# Patient Record
Sex: Female | Born: 1975 | Race: White | Hispanic: No | Smoking: Never smoker
Health system: Southern US, Community
[De-identification: ages and names within clinical notes are randomized; demographics above are authoritative.]

## PROBLEM LIST (undated history)

## (undated) DIAGNOSIS — J45909 Unspecified asthma, uncomplicated: Secondary | ICD-10-CM

## (undated) DIAGNOSIS — J189 Pneumonia, unspecified organism: Secondary | ICD-10-CM

## (undated) DIAGNOSIS — T4145XA Adverse effect of unspecified anesthetic, initial encounter: Secondary | ICD-10-CM

## (undated) DIAGNOSIS — I429 Cardiomyopathy, unspecified: Secondary | ICD-10-CM

## (undated) DIAGNOSIS — R9431 Abnormal electrocardiogram [ECG] [EKG]: Secondary | ICD-10-CM

## (undated) DIAGNOSIS — F32A Depression, unspecified: Secondary | ICD-10-CM

## (undated) DIAGNOSIS — G629 Polyneuropathy, unspecified: Secondary | ICD-10-CM

## (undated) DIAGNOSIS — Z8489 Family history of other specified conditions: Secondary | ICD-10-CM

## (undated) DIAGNOSIS — F419 Anxiety disorder, unspecified: Secondary | ICD-10-CM

## (undated) DIAGNOSIS — F4310 Post-traumatic stress disorder, unspecified: Secondary | ICD-10-CM

## (undated) DIAGNOSIS — Z6791 Unspecified blood type, Rh negative: Secondary | ICD-10-CM

## (undated) DIAGNOSIS — K56609 Unspecified intestinal obstruction, unspecified as to partial versus complete obstruction: Secondary | ICD-10-CM

## (undated) DIAGNOSIS — R1900 Intra-abdominal and pelvic swelling, mass and lump, unspecified site: Secondary | ICD-10-CM

## (undated) DIAGNOSIS — M5136 Other intervertebral disc degeneration, lumbar region: Secondary | ICD-10-CM

## (undated) DIAGNOSIS — G8929 Other chronic pain: Secondary | ICD-10-CM

## (undated) DIAGNOSIS — T8859XA Other complications of anesthesia, initial encounter: Secondary | ICD-10-CM

## (undated) DIAGNOSIS — O26899 Other specified pregnancy related conditions, unspecified trimester: Secondary | ICD-10-CM

## (undated) DIAGNOSIS — R519 Headache, unspecified: Secondary | ICD-10-CM

## (undated) DIAGNOSIS — E538 Deficiency of other specified B group vitamins: Secondary | ICD-10-CM

## (undated) DIAGNOSIS — G43909 Migraine, unspecified, not intractable, without status migrainosus: Secondary | ICD-10-CM

## (undated) DIAGNOSIS — E119 Type 2 diabetes mellitus without complications: Secondary | ICD-10-CM

## (undated) DIAGNOSIS — I499 Cardiac arrhythmia, unspecified: Secondary | ICD-10-CM

## (undated) DIAGNOSIS — Z87442 Personal history of urinary calculi: Secondary | ICD-10-CM

## (undated) DIAGNOSIS — R569 Unspecified convulsions: Secondary | ICD-10-CM

## (undated) DIAGNOSIS — F329 Major depressive disorder, single episode, unspecified: Secondary | ICD-10-CM

## (undated) DIAGNOSIS — M1990 Unspecified osteoarthritis, unspecified site: Secondary | ICD-10-CM

## (undated) DIAGNOSIS — M51369 Other intervertebral disc degeneration, lumbar region without mention of lumbar back pain or lower extremity pain: Secondary | ICD-10-CM

## (undated) DIAGNOSIS — E669 Obesity, unspecified: Secondary | ICD-10-CM

## (undated) DIAGNOSIS — N83209 Unspecified ovarian cyst, unspecified side: Secondary | ICD-10-CM

## (undated) DIAGNOSIS — E559 Vitamin D deficiency, unspecified: Secondary | ICD-10-CM

## (undated) DIAGNOSIS — K219 Gastro-esophageal reflux disease without esophagitis: Secondary | ICD-10-CM

## (undated) DIAGNOSIS — D649 Anemia, unspecified: Secondary | ICD-10-CM

## (undated) DIAGNOSIS — E611 Iron deficiency: Secondary | ICD-10-CM

## (undated) DIAGNOSIS — R51 Headache: Secondary | ICD-10-CM

## (undated) DIAGNOSIS — R109 Unspecified abdominal pain: Secondary | ICD-10-CM

## (undated) HISTORY — DX: Depression, unspecified: F32.A

## (undated) HISTORY — DX: Migraine, unspecified, not intractable, without status migrainosus: G43.909

## (undated) HISTORY — DX: Vitamin D deficiency, unspecified: E55.9

## (undated) HISTORY — PX: ROUX-EN-Y PROCEDURE: SUR1287

## (undated) HISTORY — DX: Unspecified convulsions: R56.9

## (undated) HISTORY — DX: Major depressive disorder, single episode, unspecified: F32.9

## (undated) HISTORY — DX: Polyneuropathy, unspecified: G62.9

## (undated) HISTORY — PX: ESOPHAGOGASTRODUODENOSCOPY: SHX1529

## (undated) HISTORY — DX: Post-traumatic stress disorder, unspecified: F43.10

## (undated) HISTORY — DX: Anxiety disorder, unspecified: F41.9

## (undated) HISTORY — PX: ABDOMINAL HYSTERECTOMY: SHX81

## (undated) HISTORY — PX: CHOLECYSTECTOMY: SHX55

## (undated) HISTORY — DX: Unspecified asthma, uncomplicated: J45.909

## (undated) HISTORY — PX: GASTRIC BYPASS: SHX52

## (undated) HISTORY — DX: Headache: R51

## (undated) HISTORY — PX: HERNIA REPAIR: SHX51

## (undated) HISTORY — DX: Headache, unspecified: R51.9

## (undated) HISTORY — PX: DILATION AND CURETTAGE OF UTERUS: SHX78

---

## 1997-06-25 ENCOUNTER — Ambulatory Visit (HOSPITAL_COMMUNITY): Admission: RE | Admit: 1997-06-25 | Discharge: 1997-06-25 | Payer: Self-pay | Admitting: Obstetrics and Gynecology

## 1997-09-02 ENCOUNTER — Inpatient Hospital Stay (HOSPITAL_COMMUNITY): Admission: AD | Admit: 1997-09-02 | Discharge: 1997-09-02 | Payer: Self-pay | Admitting: Obstetrics and Gynecology

## 1997-09-04 ENCOUNTER — Inpatient Hospital Stay (HOSPITAL_COMMUNITY): Admission: AD | Admit: 1997-09-04 | Discharge: 1997-09-07 | Payer: Self-pay | Admitting: Obstetrics and Gynecology

## 1997-10-15 ENCOUNTER — Other Ambulatory Visit: Admission: RE | Admit: 1997-10-15 | Discharge: 1997-10-15 | Payer: Self-pay | Admitting: Obstetrics and Gynecology

## 2000-09-13 ENCOUNTER — Emergency Department (HOSPITAL_COMMUNITY): Admission: EM | Admit: 2000-09-13 | Discharge: 2000-09-13 | Payer: Self-pay

## 2002-05-04 ENCOUNTER — Other Ambulatory Visit: Admission: RE | Admit: 2002-05-04 | Discharge: 2002-05-04 | Payer: Self-pay | Admitting: Gynecology

## 2003-01-14 ENCOUNTER — Emergency Department (HOSPITAL_COMMUNITY): Admission: EM | Admit: 2003-01-14 | Discharge: 2003-01-14 | Payer: Self-pay | Admitting: Emergency Medicine

## 2004-05-09 ENCOUNTER — Ambulatory Visit (HOSPITAL_COMMUNITY): Admission: RE | Admit: 2004-05-09 | Discharge: 2004-05-09 | Payer: Self-pay | Admitting: Neurosurgery

## 2004-08-21 ENCOUNTER — Emergency Department (HOSPITAL_COMMUNITY): Admission: EM | Admit: 2004-08-21 | Discharge: 2004-08-21 | Payer: Self-pay | Admitting: Emergency Medicine

## 2006-04-01 ENCOUNTER — Encounter: Admission: RE | Admit: 2006-04-01 | Discharge: 2006-04-01 | Payer: Self-pay | Admitting: Neurosurgery

## 2006-08-24 ENCOUNTER — Ambulatory Visit (HOSPITAL_COMMUNITY): Admission: RE | Admit: 2006-08-24 | Discharge: 2006-08-24 | Payer: Self-pay | Admitting: Family Medicine

## 2007-02-16 HISTORY — PX: OTHER SURGICAL HISTORY: SHX169

## 2008-02-16 HISTORY — PX: GASTRIC BYPASS: SHX52

## 2008-03-08 ENCOUNTER — Encounter: Admission: RE | Admit: 2008-03-08 | Discharge: 2008-03-08 | Payer: Self-pay | Admitting: Family Medicine

## 2008-09-05 ENCOUNTER — Ambulatory Visit: Payer: Self-pay | Admitting: Internal Medicine

## 2008-09-05 ENCOUNTER — Inpatient Hospital Stay (HOSPITAL_COMMUNITY): Admission: EM | Admit: 2008-09-05 | Discharge: 2008-09-08 | Payer: Self-pay | Admitting: Emergency Medicine

## 2008-10-02 ENCOUNTER — Ambulatory Visit (HOSPITAL_COMMUNITY): Admission: RE | Admit: 2008-10-02 | Discharge: 2008-10-02 | Payer: Self-pay | Admitting: Cardiology

## 2009-01-20 ENCOUNTER — Emergency Department (HOSPITAL_COMMUNITY): Admission: EM | Admit: 2009-01-20 | Discharge: 2009-01-20 | Payer: Self-pay | Admitting: Emergency Medicine

## 2009-02-15 HISTORY — PX: OTHER SURGICAL HISTORY: SHX169

## 2009-05-13 ENCOUNTER — Inpatient Hospital Stay (HOSPITAL_COMMUNITY): Admission: EM | Admit: 2009-05-13 | Discharge: 2009-05-19 | Payer: Self-pay | Admitting: Emergency Medicine

## 2010-03-08 ENCOUNTER — Encounter: Payer: Self-pay | Admitting: Neurosurgery

## 2010-05-06 LAB — CBC
HCT: 34.4 % — ABNORMAL LOW (ref 36.0–46.0)
HCT: 34.4 % — ABNORMAL LOW (ref 36.0–46.0)
HCT: 35.2 % — ABNORMAL LOW (ref 36.0–46.0)
HCT: 36.1 % (ref 36.0–46.0)
Hemoglobin: 11.7 g/dL — ABNORMAL LOW (ref 12.0–15.0)
Hemoglobin: 11.8 g/dL — ABNORMAL LOW (ref 12.0–15.0)
Hemoglobin: 11.8 g/dL — ABNORMAL LOW (ref 12.0–15.0)
Hemoglobin: 12.3 g/dL (ref 12.0–15.0)
MCHC: 33.4 g/dL (ref 30.0–36.0)
MCHC: 34.1 g/dL (ref 30.0–36.0)
MCHC: 34.2 g/dL (ref 30.0–36.0)
MCHC: 34.2 g/dL (ref 30.0–36.0)
MCV: 89.9 fL (ref 78.0–100.0)
MCV: 90.1 fL (ref 78.0–100.0)
MCV: 90.2 fL (ref 78.0–100.0)
MCV: 91.1 fL (ref 78.0–100.0)
Platelets: 221 10*3/uL (ref 150–400)
Platelets: 240 10*3/uL (ref 150–400)
Platelets: 242 10*3/uL (ref 150–400)
Platelets: 248 10*3/uL (ref 150–400)
RBC: 3.82 MIL/uL — ABNORMAL LOW (ref 3.87–5.11)
RBC: 3.83 MIL/uL — ABNORMAL LOW (ref 3.87–5.11)
RBC: 3.87 MIL/uL (ref 3.87–5.11)
RBC: 4 MIL/uL (ref 3.87–5.11)
RDW: 12.4 % (ref 11.5–15.5)
RDW: 12.7 % (ref 11.5–15.5)
RDW: 12.9 % (ref 11.5–15.5)
RDW: 13 % (ref 11.5–15.5)
WBC: 4.5 10*3/uL (ref 4.0–10.5)
WBC: 4.6 10*3/uL (ref 4.0–10.5)
WBC: 4.9 10*3/uL (ref 4.0–10.5)
WBC: 5.7 10*3/uL (ref 4.0–10.5)

## 2010-05-06 LAB — BASIC METABOLIC PANEL
BUN: 2 mg/dL — ABNORMAL LOW (ref 6–23)
BUN: 3 mg/dL — ABNORMAL LOW (ref 6–23)
BUN: 4 mg/dL — ABNORMAL LOW (ref 6–23)
CO2: 25 mEq/L (ref 19–32)
CO2: 26 mEq/L (ref 19–32)
CO2: 27 mEq/L (ref 19–32)
Calcium: 8.6 mg/dL (ref 8.4–10.5)
Calcium: 8.7 mg/dL (ref 8.4–10.5)
Calcium: 8.7 mg/dL (ref 8.4–10.5)
Chloride: 104 mEq/L (ref 96–112)
Chloride: 107 mEq/L (ref 96–112)
Chloride: 110 mEq/L (ref 96–112)
Creatinine, Ser: 0.59 mg/dL (ref 0.4–1.2)
Creatinine, Ser: 0.66 mg/dL (ref 0.4–1.2)
Creatinine, Ser: 0.71 mg/dL (ref 0.4–1.2)
GFR calc Af Amer: >60 mL/min (ref >60)
GFR calc Af Amer: >60 mL/min (ref >60)
GFR calc Af Amer: >60 mL/min (ref >60)
GFR calc non Af Amer: >60 mL/min (ref >60)
GFR calc non Af Amer: >60 mL/min (ref >60)
GFR calc non Af Amer: >60 mL/min (ref >60)
Glucose, Bld: 125 mg/dL — ABNORMAL HIGH (ref 70–99)
Glucose, Bld: 86 mg/dL (ref 70–99)
Glucose, Bld: 90 mg/dL (ref 70–99)
Potassium: 3.5 mEq/L (ref 3.5–5.1)
Potassium: 3.7 mEq/L (ref 3.5–5.1)
Potassium: 3.7 mEq/L (ref 3.5–5.1)
Sodium: 138 mEq/L (ref 135–145)
Sodium: 138 mEq/L (ref 135–145)
Sodium: 139 mEq/L (ref 135–145)

## 2010-05-06 LAB — LACTIC ACID, PLASMA: Lactic Acid, Venous: 0.5 mmol/L (ref 0.5–2.2)

## 2010-05-11 LAB — HEPATIC FUNCTION PANEL
ALT: 20 U/L (ref 0–35)
AST: 15 U/L (ref 0–37)
Albumin: 3.9 g/dL (ref 3.5–5.2)
Alkaline Phosphatase: 31 U/L — ABNORMAL LOW (ref 39–117)
Bilirubin, Direct: 0.1 mg/dL (ref 0.0–0.3)
Indirect Bilirubin: 0.4 mg/dL (ref 0.3–0.9)
Total Bilirubin: 0.5 mg/dL (ref 0.3–1.2)
Total Protein: 6.7 g/dL (ref 6.0–8.3)

## 2010-05-11 LAB — URINALYSIS, ROUTINE W REFLEX MICROSCOPIC
Bilirubin Urine: NEGATIVE
Glucose, UA: NEGATIVE mg/dL
Ketones, ur: NEGATIVE mg/dL
Nitrite: NEGATIVE
Protein, ur: NEGATIVE mg/dL
Specific Gravity, Urine: 1.024 (ref 1.005–1.030)
Urobilinogen, UA: 1 mg/dL (ref 0.0–1.0)
pH: 6.5 (ref 5.0–8.0)

## 2010-05-11 LAB — CBC
HCT: 35.1 % — ABNORMAL LOW (ref 36.0–46.0)
HCT: 37.6 % (ref 36.0–46.0)
HCT: 38.2 % (ref 36.0–46.0)
Hemoglobin: 11.8 g/dL — ABNORMAL LOW (ref 12.0–15.0)
Hemoglobin: 12.6 g/dL (ref 12.0–15.0)
Hemoglobin: 13.1 g/dL (ref 12.0–15.0)
MCHC: 33.5 g/dL (ref 30.0–36.0)
MCHC: 33.7 g/dL (ref 30.0–36.0)
MCHC: 34.2 g/dL (ref 30.0–36.0)
MCV: 90.3 fL (ref 78.0–100.0)
MCV: 90.3 fL (ref 78.0–100.0)
MCV: 90.8 fL (ref 78.0–100.0)
Platelets: 266 10*3/uL (ref 150–400)
Platelets: 276 10*3/uL (ref 150–400)
Platelets: 296 10*3/uL (ref 150–400)
RBC: 3.89 MIL/uL (ref 3.87–5.11)
RBC: 4.14 MIL/uL (ref 3.87–5.11)
RBC: 4.23 MIL/uL (ref 3.87–5.11)
RDW: 12.5 % (ref 11.5–15.5)
RDW: 12.6 % (ref 11.5–15.5)
RDW: 12.9 % (ref 11.5–15.5)
WBC: 4.2 10*3/uL (ref 4.0–10.5)
WBC: 4.8 10*3/uL (ref 4.0–10.5)
WBC: 9.4 10*3/uL (ref 4.0–10.5)

## 2010-05-11 LAB — BASIC METABOLIC PANEL
BUN: 2 mg/dL — ABNORMAL LOW (ref 6–23)
BUN: 5 mg/dL — ABNORMAL LOW (ref 6–23)
CO2: 24 mEq/L (ref 19–32)
CO2: 28 mEq/L (ref 19–32)
Calcium: 8.6 mg/dL (ref 8.4–10.5)
Calcium: 8.8 mg/dL (ref 8.4–10.5)
Chloride: 105 mEq/L (ref 96–112)
Chloride: 111 mEq/L (ref 96–112)
Creatinine, Ser: 0.55 mg/dL (ref 0.4–1.2)
Creatinine, Ser: 0.68 mg/dL (ref 0.4–1.2)
GFR calc Af Amer: >60 mL/min (ref >60)
GFR calc Af Amer: >60 mL/min (ref >60)
GFR calc non Af Amer: >60 mL/min (ref >60)
GFR calc non Af Amer: >60 mL/min (ref >60)
Glucose, Bld: 94 mg/dL (ref 70–99)
Glucose, Bld: 96 mg/dL (ref 70–99)
Potassium: 3.7 mEq/L (ref 3.5–5.1)
Potassium: 3.8 mEq/L (ref 3.5–5.1)
Sodium: 139 mEq/L (ref 135–145)
Sodium: 140 mEq/L (ref 135–145)

## 2010-05-11 LAB — URINE CULTURE
Colony Count: NO GROWTH
Culture: NO GROWTH

## 2010-05-11 LAB — POCT I-STAT, CHEM 8
BUN: 7 mg/dL (ref 6–23)
Calcium, Ion: 1.1 mmol/L — ABNORMAL LOW (ref 1.12–1.32)
Chloride: 108 mEq/L (ref 96–112)
Creatinine, Ser: 0.6 mg/dL (ref 0.4–1.2)
Glucose, Bld: 96 mg/dL (ref 70–99)
HCT: 40 % (ref 36.0–46.0)
Hemoglobin: 13.6 g/dL (ref 12.0–15.0)
Potassium: 3.9 mEq/L (ref 3.5–5.1)
Sodium: 140 mEq/L (ref 135–145)
TCO2: 22 mmol/L (ref 0–100)

## 2010-05-11 LAB — DIFFERENTIAL
Basophils Absolute: 0 10*3/uL (ref 0.0–0.1)
Basophils Relative: 0 % (ref 0–1)
Eosinophils Absolute: 0 10*3/uL (ref 0.0–0.7)
Eosinophils Relative: 0 % (ref 0–5)
Lymphocytes Relative: 16 % (ref 12–46)
Lymphs Abs: 2.9 10*3/uL (ref 0.7–4.0)
Monocytes Absolute: 0.9 10*3/uL (ref 0.1–1.0)
Monocytes Relative: 5 % (ref 3–12)
Neutro Abs: 14.5 10*3/uL — ABNORMAL HIGH (ref 1.7–7.7)
Neutrophils Relative %: 79 % — ABNORMAL HIGH (ref 43–77)

## 2010-05-11 LAB — URINE MICROSCOPIC-ADD ON

## 2010-05-11 LAB — LIPASE, BLOOD: Lipase: 24 U/L (ref 11–59)

## 2010-05-11 LAB — POCT PREGNANCY, URINE: Preg Test, Ur: NEGATIVE

## 2010-05-11 LAB — TSH: TSH: 3.163 u[IU]/mL (ref 0.350–4.500)

## 2010-05-11 LAB — HIV ANTIBODY (ROUTINE TESTING W REFLEX): HIV: NONREACTIVE

## 2010-05-19 LAB — URINALYSIS, ROUTINE W REFLEX MICROSCOPIC
Bilirubin Urine: NEGATIVE
Glucose, UA: NEGATIVE mg/dL
Ketones, ur: NEGATIVE mg/dL
Nitrite: NEGATIVE
Protein, ur: NEGATIVE mg/dL
Specific Gravity, Urine: 1.02 (ref 1.005–1.030)
Urobilinogen, UA: 1 mg/dL (ref 0.0–1.0)
pH: 7 (ref 5.0–8.0)

## 2010-05-19 LAB — URINE MICROSCOPIC-ADD ON

## 2010-05-19 LAB — COMPREHENSIVE METABOLIC PANEL
ALT: 27 U/L (ref 0–35)
AST: 21 U/L (ref 0–37)
Albumin: 4.1 g/dL (ref 3.5–5.2)
Alkaline Phosphatase: 28 U/L — ABNORMAL LOW (ref 39–117)
BUN: 6 mg/dL (ref 6–23)
CO2: 26 mEq/L (ref 19–32)
Calcium: 9.1 mg/dL (ref 8.4–10.5)
Chloride: 105 mEq/L (ref 96–112)
Creatinine, Ser: 0.59 mg/dL (ref 0.4–1.2)
GFR calc Af Amer: >60 mL/min (ref >60)
GFR calc non Af Amer: >60 mL/min (ref >60)
Glucose, Bld: 112 mg/dL — ABNORMAL HIGH (ref 70–99)
Potassium: 3.8 mEq/L (ref 3.5–5.1)
Sodium: 137 mEq/L (ref 135–145)
Total Bilirubin: 0.5 mg/dL (ref 0.3–1.2)
Total Protein: 7.2 g/dL (ref 6.0–8.3)

## 2010-05-19 LAB — DIFFERENTIAL
Basophils Absolute: 0 10*3/uL (ref 0.0–0.1)
Basophils Relative: 0 % (ref 0–1)
Eosinophils Absolute: 0.2 10*3/uL (ref 0.0–0.7)
Eosinophils Relative: 2 % (ref 0–5)
Lymphocytes Relative: 21 % (ref 12–46)
Lymphs Abs: 1.8 10*3/uL (ref 0.7–4.0)
Monocytes Absolute: 0.4 10*3/uL (ref 0.1–1.0)
Monocytes Relative: 5 % (ref 3–12)
Neutro Abs: 6.2 10*3/uL (ref 1.7–7.7)
Neutrophils Relative %: 72 % (ref 43–77)

## 2010-05-19 LAB — CBC
HCT: 41 % (ref 36.0–46.0)
Hemoglobin: 13.6 g/dL (ref 12.0–15.0)
MCHC: 33.1 g/dL (ref 30.0–36.0)
MCV: 89.7 fL (ref 78.0–100.0)
Platelets: 315 10*3/uL (ref 150–400)
RBC: 4.57 MIL/uL (ref 3.87–5.11)
RDW: 13.3 % (ref 11.5–15.5)
WBC: 8.6 10*3/uL (ref 4.0–10.5)

## 2010-05-19 LAB — PHOSPHORUS: Phosphorus: 4.1 mg/dL (ref 2.3–4.6)

## 2010-05-19 LAB — MAGNESIUM: Magnesium: 2.4 mg/dL (ref 1.5–2.5)

## 2010-05-24 LAB — URINALYSIS, ROUTINE W REFLEX MICROSCOPIC
Bilirubin Urine: NEGATIVE
Glucose, UA: NEGATIVE mg/dL
Ketones, ur: NEGATIVE mg/dL
Nitrite: NEGATIVE
Protein, ur: NEGATIVE mg/dL
Specific Gravity, Urine: 1.017 (ref 1.005–1.030)
Urobilinogen, UA: 1 mg/dL (ref 0.0–1.0)
pH: 5.5 (ref 5.0–8.0)

## 2010-05-24 LAB — COMPREHENSIVE METABOLIC PANEL
ALT: 21 U/L (ref 0–35)
AST: 19 U/L (ref 0–37)
Albumin: 3.8 g/dL (ref 3.5–5.2)
Alkaline Phosphatase: 23 U/L — ABNORMAL LOW (ref 39–117)
BUN: 8 mg/dL (ref 6–23)
CO2: 26 mEq/L (ref 19–32)
Calcium: 8.9 mg/dL (ref 8.4–10.5)
Chloride: 107 mEq/L (ref 96–112)
Creatinine, Ser: 0.64 mg/dL (ref 0.4–1.2)
GFR calc Af Amer: >60 mL/min (ref >60)
GFR calc non Af Amer: >60 mL/min (ref >60)
Glucose, Bld: 87 mg/dL (ref 70–99)
Potassium: 3.6 mEq/L (ref 3.5–5.1)
Sodium: 138 mEq/L (ref 135–145)
Total Bilirubin: 0.4 mg/dL (ref 0.3–1.2)
Total Protein: 6.9 g/dL (ref 6.0–8.3)

## 2010-05-24 LAB — LIPASE, BLOOD: Lipase: 24 U/L (ref 11–59)

## 2010-05-24 LAB — URINE MICROSCOPIC-ADD ON

## 2010-05-24 LAB — DIFFERENTIAL
Basophils Absolute: 0 10*3/uL (ref 0.0–0.1)
Basophils Relative: 0 % (ref 0–1)
Eosinophils Absolute: 0.1 10*3/uL (ref 0.0–0.7)
Eosinophils Relative: 2 % (ref 0–5)
Lymphocytes Relative: 21 % (ref 12–46)
Lymphs Abs: 1.5 10*3/uL (ref 0.7–4.0)
Monocytes Absolute: 0.4 10*3/uL (ref 0.1–1.0)
Monocytes Relative: 6 % (ref 3–12)
Neutro Abs: 5 10*3/uL (ref 1.7–7.7)
Neutrophils Relative %: 72 % (ref 43–77)

## 2010-05-24 LAB — URINE CULTURE

## 2010-05-24 LAB — TYPE AND SCREEN
ABO/RH(D): A NEG
Antibody Screen: NEGATIVE

## 2010-05-24 LAB — ABO/RH: ABO/RH(D): A NEG

## 2010-05-24 LAB — CBC
HCT: 39.3 % (ref 36.0–46.0)
Hemoglobin: 13 g/dL (ref 12.0–15.0)
MCHC: 33.1 g/dL (ref 30.0–36.0)
MCV: 89.6 fL (ref 78.0–100.0)
Platelets: 281 10*3/uL (ref 150–400)
RBC: 4.39 MIL/uL (ref 3.87–5.11)
RDW: 13.9 % (ref 11.5–15.5)
WBC: 6.9 10*3/uL (ref 4.0–10.5)

## 2010-05-24 LAB — PROTIME-INR
INR: 1 (ref 0.00–1.49)
Prothrombin Time: 13.8 seconds (ref 11.6–15.2)

## 2010-05-24 LAB — TSH: TSH: 1.503 u[IU]/mL (ref 0.350–4.500)

## 2010-05-24 LAB — D-DIMER, QUANTITATIVE: D-Dimer, Quant: 0.45 ug/mL-FEU (ref 0.00–0.48)

## 2010-05-24 LAB — APTT: aPTT: 25 seconds (ref 24–37)

## 2010-05-24 LAB — GLUCOSE, CAPILLARY: Glucose-Capillary: 87 mg/dL (ref 70–99)

## 2010-05-24 LAB — POCT PREGNANCY, URINE: Preg Test, Ur: NEGATIVE

## 2010-06-30 NOTE — H&P (Signed)
[name], [name]           ACCOUNT NO.:  [id number]   MEDICAL RECORD NO.:  [id number]          PATIENT TYPE:  EMS   LOCATION:  MAJO                         FACILITY:  [facility]   PHYSICIAN:  Cristy Hilts. Jacinto Halim, MD       DATE OF BIRTH:  [birth year]   DATE OF ADMISSION:  09/05/2008  DATE OF DISCHARGE:                              HISTORY & PHYSICAL   CHIEF COMPLAINTS:  Syncope.   HISTORY OF PRESENT ILLNESS:  [name] is a 35 year old female with a  history of near syncopal spells over the last couple months.  Today she  actually had a syncopal spell where she fell and hit her cheek and hip.  She presented to emergency room for further evaluation.  In the  emergency room her EKG shows sinus rhythm with a short PR interval.  There is no obvious delta waves.  She does admit to history of  palpitations.  She has no family history of sudden cardiac death at a  young age, although she does have a grandfather that died at 56 of an  MI.  She is admitted now for further evaluation.   PAST MEDICAL HISTORY:  Is remarkable for obesity, she had gastric  banding about a year ago and has lost 160 pounds.  She apparently has a  history of depression, she is on Paxil.  She has had recent positive  stools and has seen Dr. Ewing Schlein, she had an endoscopy earlier this week  and needs a small-bowel follow-through.  She did have what sounds like  an antral erosion.  She has had previous kidney stones and has been told  she has hemangioma on her liver.   CURRENT MEDICATIONS:  1. Nexium 40 mg a day.  2. Paxil 60 mg a day.  3. Iron.  4. Vitamin B.   ALLERGIES:  She is allergic to PENICILLIN AND SULFA.   SOCIAL HISTORY:  She is divorced, she has one child.  She is a  nonsmoker.  She works in a nursery.  She does not drink excessive  caffeine and denies drug use.   FAMILY HISTORY:  Is unremarkable for sudden cardiac death except for  grandfather who died at 59 of an MI.   REVIEW OF SYSTEMS:  Is  remarkable for occasional palpitations.  She does  not remember tachycardia prior to her syncopal spell.   PHYSICAL EXAM:  VITAL SIGNS:  Blood pressure 120/76, pulse 78,  respirations 12.  GENERAL:  She is a well-developed, well-nourished  female in no acute distress.  HEENT: Normocephalic, atraumatic.  Extraocular movements intact.  Sclerae anicteric.  NECK:  Without JVD or bruit.  CHEST:  Clear to auscultation percussion.  CARDIAC:  Reveals regular rate and rhythm without murmur or gallop.  Normal S1 and S2.  ABDOMEN:  Nontender.  No hepatosplenomegaly.  EXTREMITIES:  Without edema.  Distal pulses are intact.  NEURO:  Exam grossly intact.  She is awake, alert and oriented,  cooperative.  Moves all extremities without obvious deficit.  SKIN:  Warm and dry.   LABORATORY DATA:  White count 6.9, hemoglobin 13, hematocrit 39.3,  platelets 281.  Sodium 138, potassium 3.6, BUN 8, creatinine 0.64.  Liver functions were normal.  Pregnancy test is negative.  Urinalysis  shows a small amount of leukocyte esterase and few bacteria.  Urine  culture is pending.   IMPRESSION:  1. Syncope.  2. Palpitations.  3. Mildly abnormal EKG with a short PR, rule out Wolff-Parkinson-      White.  4. Obesity, gastric banding 1 year ago with a weight loss of about 160      pounds.   PLAN:  The patient will be admitted to telemetry.  She will be monitored  overnight.  If she has no further arrhythmias we may consider outpatient  stress test and echo.      Abelino Derrick, P.A.      Cristy Hilts. Jacinto Halim, MD     LKK/MEDQ  D:  09/05/2008  T:  09/05/2008  Job:  [id number]

## 2010-06-30 NOTE — Discharge Summary (Signed)
[name], [name]           ACCOUNT NO.:  [id number]   MEDICAL RECORD NO.:  [id number]          PATIENT TYPE:  INP   LOCATION:  3715                         FACILITY:  [facility]   PHYSICIAN:  Cristy Hilts. Jacinto Halim, MD       DATE OF BIRTH:  [birth year]   DATE OF ADMISSION:  09/05/2008  DATE OF DISCHARGE:  09/08/2008                               DISCHARGE SUMMARY   DISCHARGE DIAGNOSES:  1. Postural presyncope and postural syncope.  2. Episodic sinus tachycardia.  3. Postural hypotension.      a.     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   DISCHARGE CONDITION:  Improved.   PROCEDURES:  None.   DISCHARGE MEDICATIONS:  1. Paxil 60 mg one daily.  2. Children's chewable vitamins two daily.  3. Vitamin B6 one daily.  4. Vitamin B1 one daily.  5. Ferrous sulfate 325 mg daily.  6. Nexium 40 mg daily.  7. Florinef 0.1 mg one daily.   HOSPITAL COURSE:  The patient was admitted on September 05, 2008 secondary to  near syncopal spells over the last several months.  On the day of  admission she had a syncope spell which she fell onto her cheek and hip  and presented to the emergency room for further evaluation.  EKG  revealed sinus rhythm with short PR interval, no obvious delta waves.  She did state she had palpitations.  No family history of cardiac death  though grandfather died at 13 of an MI.   PAST MEDICAL HISTORY:  Positive for obesity, gastric bypass banding  about a year ago and lost 160 pounds.  She has a history of depression  and is on Paxil.  She has recent heme-positive stools and saw Dr. Ewing Schlein  and had an endoscopy earlier the week of admission and would need a  small bowel follow-through.  Did sound she may have antral erosion.  Also other history includes previous kidney stones and she has  hemangioma on her liver.   ALLERGIES:  PENICILLIN and SULFA.   The patient was admitted and seen by Dr. Johney Frame with EP.  He felt this  was postural  hypotension and presyncope.  She was having inappropriate  tachycardia with minimal exertion and she did have orthostatic  hypotension.  She was given IV fluids for over 24 hours and by September 08, 2008 blood pressure lying 101/67, sitting 104/67 and standing 113/76,  pulse went from 80 up to 114 with standing but this was an improvement  over her previous orthostatic pressures and pulse.  Please note the  actual discharge orthostatic pressures lying blood pressure was 105/69  with a pulse of 71, sitting 113/79 with a pulse of 73 and standing was  110/76 with a pulse of 83 which was improved from the 114.  She was felt  ready for discharge home and will follow up as an outpatient.  Please  note on discharge instructions initially said we could return to work  Monday.  She works in [employer].  She was given a note that  to be out  until she saw Dr. Allyson Sabal back.  She was on a regular diet.  The office  will call to set up an appointment with Dr. Allyson Sabal and no specific  restrictions were given on activity, though if she could not work she  also could not drive.   LABORATORY DATA:  Pregnancy test was negative.  CBC hemoglobin 13,  hematocrit 39.3, WBC 6.9, platelets 281, otherwise normal.  His  metabolic panel sodium 138, potassium 3.6, chloride 107, CO2 26, glucose  87, BUN 8, creatinine 0.64, total bili 0.4, alkaline phos 23, SGOT 19,  SGPT 21, calcium 8.9, lipase 24.  UA moderate amount of blood, small  amount of leukocytes, microscopic 3-6 WBCs, few bacteria.  D-dimer was  negative at 0.45.  Protime 13.8 with an INR of 1 and PTT 25.   TSH 1.503 and urine culture with multiple bacterial morphotypes   Abdominal x-ray no acute abnormality.  She did have postop changes in  the abdomen.      Darcella Gasman. [place], N.P.      Cristy Hilts. Jacinto Halim, MD     LRI/MEDQ  D:  09/18/2008  T:  09/19/2008  Job:  [id number]   cc:   Nanetta Batty, M.D.  Windle Guard, M.D.  Hillis Range, MD

## 2010-06-30 NOTE — Op Note (Signed)
NAMEARRYANA, TOLLESON           ACCOUNT NO.:  [id number]   MEDICAL RECORD NO.:  [id number]          PATIENT TYPE:  OIB   LOCATION:  2899                         FACILITY:  [facility]   PHYSICIAN:  Ritta Slot, MD     DATE OF BIRTH:  1976/01/01   DATE OF PROCEDURE:  DATE OF DISCHARGE:  10/02/2008                               OPERATIVE REPORT   This is a tilt table study.   Ms. Goecke is a pleasant 35 year old Caucasian female who has had  numerous episodes of syncope.   PAST MEDICAL HISTORY:  Gastric bypass done at [facility] a year ago.  Since then, she has been having episodes of syncope over the past 2-3  months.  As it is suggested that she possibly got POP syndrome, but she  does not appear to be affected with it.  She was see in consultation by  Dr. Hillis Range who put her on low-dose Florinef.  Despite that, she  has had a beta-blocker added and she continued to have episodes of  passing out.  This morning, she did not take the medications and did  almost passed out.  She is therefore brought for tilt table study for  further evaluation.  She was placed in the supine position.  She was  given 1 L bolus of normal saline prior to the commencement of the study.  Her baseline heart rate was 70 with a blood pressure of 106/66.  She was  tilted to 70 degrees and at 30 minutes, her heart rate went up to 98  sinus rhythm.  Her blood pressure 102/60.  A 45 minutes, heart rate was  144 sinus tachycardia with a blood pressure 106/70.  The procedure was  terminated, she was brought to immediate  supine position and the heart  rate immediately reverted back to sinus rhythm at 72 beats per minute  with a blood pressure 116/67.   IMPRESSION AND PLAN:  Negative tilt tablet study, but she did have  evidence of an appropriate sinus tachycardia at the end of the study  with a heart rate down to 140, which returned spontaneously to 70 beats  per minute and on head down in the supine  position.   Of note, she was given 1 L bolus of normal saline prior to procedure and  she did not taken any medicines at all.   I do not believe this lady has POP syndrome on the basis of the study,  although she did have a mildly inappropriate response with a significant  sinus tachycardia.  I think she probably may will benefit from increased  dose of beta blockade to prevent this inappropriate tachycardia, but  develops and just needs to be kept well hydrated.  There is no  indication for the addition of any further vasoconstricting medications.      Ritta Slot, MD  Electronically Signed     Ritta Slot, MD  Electronically Signed    HS/MEDQ  D:  10/02/2008  T:  10/03/2008  Job:  [id number]

## 2010-06-30 NOTE — Consult Note (Signed)
[name], [name] NO.:  [id number]   MEDICAL RECORD NO.:  [id number]          PATIENT TYPE:  OBV   LOCATION:  3715                         FACILITY:  [facility]   PHYSICIAN:  Hillis Range, MD       DATE OF BIRTH:  28-Feb-1975   DATE OF CONSULTATION:  DATE OF DISCHARGE:                                 CONSULTATION   REQUESTING PHYSICIAN:  Nanetta Batty, M.D.   REASON FOR CONSULTATION:  Syncope.   HISTORY OF PRESENT ILLNESS:  [name] is a pleasant 35 year old  female with a history of prior gastric bypass who is admitted with  syncope.  She notes that over the past few months she has had  progressive symptoms of orthostasis.  She notes that if she rises  quickly and takes several steps then she develops numbness in her hands  with dizziness and subsequent darkening of her vision.  She has found  that if she sits down quickly, these episodes typically abate within  several minutes.  She denies episodes of chest pain, shortness of  breath, palpitations or seizures.  She reports that her most recent  episode occurred on July 22 at approximately 9:30 in the morning when  she woke and was ambulating within her house.  She does not recall the  events of her syncopal episode but did pass out for an undetermined  period of time.  Upon waking, she found that she had fallen against  a  dog kennel within her home.  She was brought to [facility] for further  evaluation and management.  Here she has been observed to have multiple  episodes of abrupt sinus tachycardia on the monitor which often appear  to coincide to her symptoms.  She denies symptoms of presyncope.  She  denies any symptoms of heart racing or palpitations in the past.  She is  otherwise without complaint at this time.   PAST MEDICAL HISTORY:  1. Presyncope (as above).  2. Status post gastric bypass surgery 2009 at [facility].  3. GI bleeding with workup ongoing.  4. Anemia.  5. Childhood  asthma.   ALLERGIES:  PENICILLIN and SULFA.   MEDICATIONS:  1. Ferrous sulfate 325 mg daily.  2. Protonix 40 mg b.i.d.  3. Paxil 60 mg daily.  4. Vitamin B1 and vitamin B6.   SOCIAL HISTORY:  The patient lives in [place] with her son.  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  She denies tobacco, alcohol or drug  use.   FAMILY HISTORY:  Notable for coronary artery disease.  The patient's  mother has rheumatoid arthritis.  She has a second maternal cousin who  died suddenly in his 30s.  She is unaware of any other family history of  sudden death or arrhythmias.   REVIEW OF SYSTEMS:  All systems were reviewed and negative except as  outlined in the HPI above and as documented below.  The patient reports  an ongoing workup for GI bleeding.  She reports occasional night sweats.  She has lost more than 120 pounds over the past year.  She reports  frequent chills.   PHYSICAL EXAMINATION:  TELEMETRY:  Reveals sinus rhythm with episodic  sinus tachycardia with heart rates into the 130s.  These episodes appear  to have an abrupt onset and gradual termination.  Upon my exam, I  ambulated the patient approximately 10 feet and found her to develop an  abrupt sinus tachycardia with heart rates to the 130s which gradually  terminated.  No other arrhythmias are observed.  VITALS:  Blood pressure 107/66, heart rate 72, respirations 18, sats 98%  on room air, afebrile.  GENERAL:  The patient is a thin, well-appearing female in no acute  distress.  She is alert and oriented x3.  HEENT:  Normocephalic, atraumatic.  Sclerae clear.  Conjunctivae pink.  Oropharynx clear.  NECK:  Supple.  No thyromegaly, JVD or bruits.  LUNGS:  Clear to auscultation bilaterally.  HEART:  Regular rate and rhythm.  No murmurs, rubs or gallops.  GI:  Soft, nontender, nondistended.  Positive bowel sounds.  EXTREMITIES:  No clubbing, cyanosis or edema.  NEUROLOGIC:  Cranial nerves II-XII are intact.  Strength and  sensation  are intact.  SKIN:  No ecchymoses or lacerations.  MUSCULOSKELETAL:  No deformity or atrophy.  PSYCHIATRIC:  Euthymic mood.  Full affect.   EKG reveals sinus rhythm at 60 beats per minute.  The PR interval was  116 milliseconds with no obvious pre-excitation.  The QT interval is 420  milliseconds.  The EKG is otherwise normal.   LABORATORY DATA:  TSH 1.503, INR 1, D-dimer 0.45, potassium 3.6, BUN 8,  creatinine 0.64.  Liver profile unremarkable.  Hematocrit 39, platelets  281.  Pregnancy negative.   Abdominal x-ray reveals no acute findings.   IMPRESSION:  [name] is a pleasant 35 year old female who presents  for further evaluation and management of syncope.  She has had one  syncopal episode and multiple presyncopal episodes.  These episodes have  always occurred immediately upon standing and have never occurred while  sitting or lying per her history.  She clearly describes postural  presyncope.  She has been observed to have episodic sinus tachycardia in  the hospital and I was able to reproduce sinus tachycardia by ambulating  the patient approximately 10 feet.  She immediately developed heart  rates from the 70s to the 130s with gradual decline in her heart rate.  Her EKG today appears normal.  She has no known structural heart  disease.  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  She notes that warm weather and particularly being at the beach 2 weeks  ago seemed to worsen her symptoms.  I suspected that the patient has  some component of volume depletion.  I think that our initial strategy  should be to maintain adequate preload.  We will check orthostatics at  this time and initiate IV hydration.  Once the patient has been  adequately hydrated, we will place the patient on Florinef  0.1 mg daily.  I have also encouraged adequate hydration.  I would defer  any  further electrophysiology testing at this time.  We will continue to  observe the patient on telemetry during her hospital stay.  I have  discussed the above with Dr. Allyson Sabal this morning who is in agreement with  this strategy.      Hillis Range, MD  Electronically Signed     JA/MEDQ  D:  09/07/2008  T:  09/08/2008  Job:  [id number]   cc:   Nanetta Batty, M.D.  Windle Guard, M.D.

## 2011-05-21 ENCOUNTER — Emergency Department: Payer: Self-pay | Admitting: *Deleted

## 2011-05-21 LAB — URINALYSIS, COMPLETE
Bilirubin,UR: NEGATIVE
Blood: NEGATIVE
Glucose,UR: NEGATIVE mg/dL (ref 0–75)
Hyaline Cast: 6
Nitrite: NEGATIVE
Ph: 5 (ref 4.5–8.0)
Protein: NEGATIVE
RBC,UR: 1 /HPF (ref 0–5)
Specific Gravity: 1.017 (ref 1.003–1.030)
Squamous Epithelial: 3
WBC UR: 5 /HPF (ref 0–5)

## 2012-01-10 ENCOUNTER — Ambulatory Visit: Payer: Self-pay | Admitting: Oncology

## 2012-01-10 LAB — CBC CANCER CENTER
Basophil #: 0 x10 3/mm (ref 0.0–0.1)
Basophil %: 0.6 %
Eosinophil #: 0.1 x10 3/mm (ref 0.0–0.7)
Eosinophil %: 1.1 %
HCT: 27.5 % — ABNORMAL LOW (ref 35.0–47.0)
HGB: 8.2 g/dL — ABNORMAL LOW (ref 12.0–16.0)
Lymphocyte #: 1.2 x10 3/mm (ref 1.0–3.6)
Lymphocyte %: 16.9 %
MCH: 21 pg — ABNORMAL LOW (ref 26.0–34.0)
MCHC: 29.8 g/dL — ABNORMAL LOW (ref 32.0–36.0)
MCV: 70 fL — ABNORMAL LOW (ref 80–100)
Monocyte #: 0.5 x10 3/mm (ref 0.2–0.9)
Monocyte %: 6.6 %
Neutrophil #: 5.2 x10 3/mm (ref 1.4–6.5)
Neutrophil %: 74.8 %
Platelet: 424 x10 3/mm (ref 150–440)
RBC: 3.92 10*6/uL (ref 3.80–5.20)
RDW: 16.9 % — ABNORMAL HIGH (ref 11.5–14.5)
WBC: 7 x10 3/mm (ref 3.6–11.0)

## 2012-01-10 LAB — FOLATE: Folic Acid: 5.7 ng/mL (ref 3.1–100.0)

## 2012-01-10 LAB — RETICULOCYTES
Absolute Retic Count: 0.0412 10*6/uL (ref 0.023–0.096)
Reticulocyte: 1.05 % (ref 0.5–2.2)

## 2012-01-10 LAB — FERRITIN: Ferritin (ARMC): 3 ng/mL — ABNORMAL LOW (ref 8–388)

## 2012-01-10 LAB — IRON AND TIBC
Iron Bind.Cap.(Total): 504 ug/dL — ABNORMAL HIGH (ref 250–450)
Iron Saturation: 3 %
Iron: 16 ug/dL — ABNORMAL LOW (ref 50–170)
Unbound Iron-Bind.Cap.: 488 ug/dL

## 2012-01-10 LAB — LACTATE DEHYDROGENASE: LDH: 147 U/L (ref 81–246)

## 2012-01-16 ENCOUNTER — Ambulatory Visit: Payer: Self-pay | Admitting: Oncology

## 2012-02-16 ENCOUNTER — Ambulatory Visit: Payer: Self-pay | Admitting: Oncology

## 2012-02-22 LAB — URINALYSIS, COMPLETE
Bilirubin,UR: NEGATIVE
Glucose,UR: NEGATIVE mg/dL (ref 0–75)
Ketone: NEGATIVE
Nitrite: NEGATIVE
Ph: 6 (ref 4.5–8.0)
Protein: NEGATIVE
RBC,UR: 5 /HPF (ref 0–5)
Specific Gravity: 1.004 (ref 1.003–1.030)
Squamous Epithelial: 4
WBC UR: 7 /HPF (ref 0–5)

## 2012-02-22 LAB — COMPREHENSIVE METABOLIC PANEL
Albumin: 4 g/dL (ref 3.4–5.0)
Alkaline Phosphatase: 57 U/L (ref 50–136)
Anion Gap: 8 (ref 7–16)
BUN: 8 mg/dL (ref 7–18)
Bilirubin,Total: 0.3 mg/dL (ref 0.2–1.0)
Calcium, Total: 9 mg/dL (ref 8.5–10.1)
Chloride: 107 mmol/L (ref 98–107)
Co2: 24 mmol/L (ref 21–32)
Creatinine: 0.69 mg/dL (ref 0.60–1.30)
EGFR (African American): >60
EGFR (Non-African Amer.): >60
Glucose: 89 mg/dL (ref 65–99)
Osmolality: 275 (ref 275–301)
Potassium: 3.9 mmol/L (ref 3.5–5.1)
SGOT(AST): 20 U/L (ref 15–37)
SGPT (ALT): 20 U/L (ref 12–78)
Sodium: 139 mmol/L (ref 136–145)
Total Protein: 7.8 g/dL (ref 6.4–8.2)

## 2012-02-22 LAB — CBC
HCT: 38.1 % (ref 35.0–47.0)
HGB: 12.5 g/dL (ref 12.0–16.0)
MCH: 26.4 pg (ref 26.0–34.0)
MCHC: 32.9 g/dL (ref 32.0–36.0)
MCV: 80 fL (ref 80–100)
Platelet: 337 10*3/uL (ref 150–440)
RBC: 4.76 10*6/uL (ref 3.80–5.20)
RDW: 28.5 % — ABNORMAL HIGH (ref 11.5–14.5)
WBC: 9 10*3/uL (ref 3.6–11.0)

## 2012-02-22 LAB — LIPASE, BLOOD: Lipase: 145 U/L (ref 73–393)

## 2012-02-22 LAB — PREGNANCY, URINE: Pregnancy Test, Urine: NEGATIVE m[IU]/mL

## 2012-02-23 ENCOUNTER — Inpatient Hospital Stay: Payer: Self-pay | Admitting: Student

## 2012-02-24 LAB — CBC WITH DIFFERENTIAL/PLATELET
Basophil #: 0 10*3/uL (ref 0.0–0.1)
Basophil %: 0.7 %
Eosinophil #: 0.3 10*3/uL (ref 0.0–0.7)
Eosinophil %: 7.4 %
HCT: 33.9 % — ABNORMAL LOW (ref 35.0–47.0)
HGB: 10.8 g/dL — ABNORMAL LOW (ref 12.0–16.0)
Lymphocyte #: 1.3 10*3/uL (ref 1.0–3.6)
Lymphocyte %: 36.3 %
MCH: 25.9 pg — ABNORMAL LOW (ref 26.0–34.0)
MCHC: 32 g/dL (ref 32.0–36.0)
MCV: 81 fL (ref 80–100)
Monocyte #: 0.3 x10 3/mm (ref 0.2–0.9)
Monocyte %: 9.1 %
Neutrophil #: 1.7 10*3/uL (ref 1.4–6.5)
Neutrophil %: 46.5 %
Platelet: 260 10*3/uL (ref 150–440)
RBC: 4.19 10*6/uL (ref 3.80–5.20)
RDW: 28.1 % — ABNORMAL HIGH (ref 11.5–14.5)
WBC: 3.7 10*3/uL (ref 3.6–11.0)

## 2012-02-24 LAB — COMPREHENSIVE METABOLIC PANEL
Albumin: 3.1 g/dL — ABNORMAL LOW (ref 3.4–5.0)
Alkaline Phosphatase: 104 U/L (ref 50–136)
Anion Gap: 7 (ref 7–16)
BUN: 2 mg/dL — ABNORMAL LOW (ref 7–18)
Bilirubin,Total: 0.5 mg/dL (ref 0.2–1.0)
Calcium, Total: 8.3 mg/dL — ABNORMAL LOW (ref 8.5–10.1)
Chloride: 109 mmol/L — ABNORMAL HIGH (ref 98–107)
Co2: 26 mmol/L (ref 21–32)
Creatinine: 0.57 mg/dL — ABNORMAL LOW (ref 0.60–1.30)
EGFR (African American): >60
EGFR (Non-African Amer.): >60
Glucose: 85 mg/dL (ref 65–99)
Osmolality: 279 (ref 275–301)
Potassium: 3.7 mmol/L (ref 3.5–5.1)
SGOT(AST): 270 U/L — ABNORMAL HIGH (ref 15–37)
SGPT (ALT): 433 U/L — ABNORMAL HIGH (ref 12–78)
Sodium: 142 mmol/L (ref 136–145)
Total Protein: 6.4 g/dL (ref 6.4–8.2)

## 2012-02-24 LAB — URINE CULTURE

## 2012-02-25 LAB — HEPATIC FUNCTION PANEL A (ARMC)
Albumin: 3 g/dL — ABNORMAL LOW (ref 3.4–5.0)
Alkaline Phosphatase: 87 U/L (ref 50–136)
Bilirubin, Direct: 0.1 mg/dL (ref 0.00–0.20)
Bilirubin,Total: 0.3 mg/dL (ref 0.2–1.0)
SGOT(AST): 80 U/L — ABNORMAL HIGH (ref 15–37)
SGPT (ALT): 271 U/L — ABNORMAL HIGH (ref 12–78)
Total Protein: 6.1 g/dL — ABNORMAL LOW (ref 6.4–8.2)

## 2012-02-29 ENCOUNTER — Emergency Department: Payer: Self-pay | Admitting: Emergency Medicine

## 2012-02-29 LAB — COMPREHENSIVE METABOLIC PANEL
Albumin: 3.4 g/dL (ref 3.4–5.0)
Alkaline Phosphatase: 75 U/L (ref 50–136)
Anion Gap: 9 (ref 7–16)
BUN: 9 mg/dL (ref 7–18)
Bilirubin,Total: 0.3 mg/dL (ref 0.2–1.0)
Calcium, Total: 8.4 mg/dL — ABNORMAL LOW (ref 8.5–10.1)
Chloride: 106 mmol/L (ref 98–107)
Co2: 24 mmol/L (ref 21–32)
Creatinine: 0.7 mg/dL (ref 0.60–1.30)
EGFR (African American): >60
EGFR (Non-African Amer.): >60
Glucose: 98 mg/dL (ref 65–99)
Osmolality: 276 (ref 275–301)
Potassium: 3.7 mmol/L (ref 3.5–5.1)
SGOT(AST): 21 U/L (ref 15–37)
SGPT (ALT): 91 U/L — ABNORMAL HIGH (ref 12–78)
Sodium: 139 mmol/L (ref 136–145)
Total Protein: 6.9 g/dL (ref 6.4–8.2)

## 2012-02-29 LAB — URINALYSIS, COMPLETE
Bilirubin,UR: NEGATIVE
Blood: NEGATIVE
Glucose,UR: NEGATIVE mg/dL (ref 0–75)
Hyaline Cast: 2
Ketone: NEGATIVE
Leukocyte Esterase: NEGATIVE
Nitrite: NEGATIVE
Ph: 5 (ref 4.5–8.0)
Protein: NEGATIVE
RBC,UR: 7 /HPF (ref 0–5)
Specific Gravity: 1.023 (ref 1.003–1.030)
Squamous Epithelial: 2
WBC UR: 2 /HPF (ref 0–5)

## 2012-02-29 LAB — CBC WITH DIFFERENTIAL/PLATELET
Basophil #: 0 10*3/uL (ref 0.0–0.1)
Basophil %: 0.6 %
Eosinophil #: 0.2 10*3/uL (ref 0.0–0.7)
Eosinophil %: 2.8 %
HCT: 35.1 % (ref 35.0–47.0)
HGB: 11.4 g/dL — ABNORMAL LOW (ref 12.0–16.0)
Lymphocyte #: 1.2 10*3/uL (ref 1.0–3.6)
Lymphocyte %: 20.1 %
MCH: 26.5 pg (ref 26.0–34.0)
MCHC: 32.6 g/dL (ref 32.0–36.0)
MCV: 81 fL (ref 80–100)
Monocyte #: 0.4 x10 3/mm (ref 0.2–0.9)
Monocyte %: 6.8 %
Neutrophil #: 4.3 10*3/uL (ref 1.4–6.5)
Neutrophil %: 69.7 %
Platelet: 303 10*3/uL (ref 150–440)
RBC: 4.32 10*6/uL (ref 3.80–5.20)
RDW: 27.4 % — ABNORMAL HIGH (ref 11.5–14.5)
WBC: 6.2 10*3/uL (ref 3.6–11.0)

## 2012-02-29 LAB — TROPONIN I: Troponin-I: <0.02 ng/mL

## 2012-02-29 LAB — PREGNANCY, URINE: Pregnancy Test, Urine: NEGATIVE m[IU]/mL

## 2012-02-29 LAB — LIPASE, BLOOD: Lipase: 98 U/L (ref 73–393)

## 2012-03-01 ENCOUNTER — Other Ambulatory Visit: Payer: Self-pay | Admitting: Gastroenterology

## 2012-03-01 DIAGNOSIS — R945 Abnormal results of liver function studies: Secondary | ICD-10-CM

## 2012-03-06 LAB — CBC CANCER CENTER
Basophil #: 0.1 x10 3/mm (ref 0.0–0.1)
Basophil %: 0.8 %
Eosinophil #: 0.2 x10 3/mm (ref 0.0–0.7)
Eosinophil %: 3 %
HCT: 38.4 % (ref 35.0–47.0)
HGB: 12.6 g/dL (ref 12.0–16.0)
Lymphocyte #: 1.5 x10 3/mm (ref 1.0–3.6)
Lymphocyte %: 21.5 %
MCH: 26.7 pg (ref 26.0–34.0)
MCHC: 32.9 g/dL (ref 32.0–36.0)
MCV: 81 fL (ref 80–100)
Monocyte #: 0.4 x10 3/mm (ref 0.2–0.9)
Monocyte %: 6 %
Neutrophil #: 4.8 x10 3/mm (ref 1.4–6.5)
Neutrophil %: 68.7 %
Platelet: 325 x10 3/mm (ref 150–440)
RBC: 4.73 10*6/uL (ref 3.80–5.20)
RDW: 26.4 % — ABNORMAL HIGH (ref 11.5–14.5)
WBC: 7 x10 3/mm (ref 3.6–11.0)

## 2012-03-06 LAB — FERRITIN: Ferritin (ARMC): 14 ng/mL (ref 8–388)

## 2012-03-06 LAB — IRON AND TIBC
Iron Bind.Cap.(Total): 383 ug/dL (ref 250–450)
Iron Saturation: 19 %
Iron: 74 ug/dL (ref 50–170)
Unbound Iron-Bind.Cap.: 309 ug/dL

## 2012-03-09 ENCOUNTER — Ambulatory Visit
Admission: RE | Admit: 2012-03-09 | Discharge: 2012-03-09 | Disposition: A | Discharge status: Home / Self Care | Payer: Medicaid Other | Source: Ambulatory Visit | Attending: Gastroenterology | Admitting: Gastroenterology

## 2012-03-09 DIAGNOSIS — R945 Abnormal results of liver function studies: Secondary | ICD-10-CM

## 2012-03-09 MED ORDER — GADOBENATE DIMEGLUMINE 529 MG/ML IV SOLN
17.0000 mL | Freq: Once | INTRAVENOUS | Status: AC | PRN
  Administered 2012-03-09: 17 mL via INTRAVENOUS

## 2012-03-18 ENCOUNTER — Ambulatory Visit: Payer: Self-pay

## 2012-03-18 ENCOUNTER — Ambulatory Visit: Payer: Self-pay | Admitting: Oncology

## 2012-05-12 DIAGNOSIS — M47816 Spondylosis without myelopathy or radiculopathy, lumbar region: Secondary | ICD-10-CM | POA: Insufficient documentation

## 2012-05-12 DIAGNOSIS — M533 Sacrococcygeal disorders, not elsewhere classified: Secondary | ICD-10-CM | POA: Insufficient documentation

## 2012-05-12 DIAGNOSIS — M797 Fibromyalgia: Secondary | ICD-10-CM | POA: Insufficient documentation

## 2012-05-12 DIAGNOSIS — G894 Chronic pain syndrome: Secondary | ICD-10-CM | POA: Insufficient documentation

## 2012-05-12 DIAGNOSIS — M47817 Spondylosis without myelopathy or radiculopathy, lumbosacral region: Secondary | ICD-10-CM | POA: Insufficient documentation

## 2012-06-07 ENCOUNTER — Ambulatory Visit: Payer: Self-pay | Admitting: Oncology

## 2012-06-08 LAB — CBC CANCER CENTER
Basophil #: 0 x10 3/mm (ref 0.0–0.1)
Basophil %: 0.5 %
Eosinophil #: 0.1 x10 3/mm (ref 0.0–0.7)
Eosinophil %: 0.9 %
HCT: 37.5 % (ref 35.0–47.0)
HGB: 12.1 g/dL (ref 12.0–16.0)
Lymphocyte #: 1.4 x10 3/mm (ref 1.0–3.6)
Lymphocyte %: 20.3 %
MCH: 28.4 pg (ref 26.0–34.0)
MCHC: 32.4 g/dL (ref 32.0–36.0)
MCV: 88 fL (ref 80–100)
Monocyte #: 0.6 x10 3/mm (ref 0.2–0.9)
Monocyte %: 8.3 %
Neutrophil #: 4.9 x10 3/mm (ref 1.4–6.5)
Neutrophil %: 70 %
Platelet: 296 x10 3/mm (ref 150–440)
RBC: 4.27 10*6/uL (ref 3.80–5.20)
RDW: 14 % (ref 11.5–14.5)
WBC: 7 x10 3/mm (ref 3.6–11.0)

## 2012-06-08 LAB — IRON AND TIBC
Iron Bind.Cap.(Total): 447 ug/dL (ref 250–450)
Iron Saturation: 15 %
Iron: 68 ug/dL (ref 50–170)
Unbound Iron-Bind.Cap.: 379 ug/dL

## 2012-06-08 LAB — FERRITIN: Ferritin (ARMC): 5 ng/mL — ABNORMAL LOW (ref 8–388)

## 2012-06-15 ENCOUNTER — Ambulatory Visit: Payer: Self-pay | Admitting: Oncology

## 2012-07-05 ENCOUNTER — Emergency Department: Payer: Self-pay | Admitting: Emergency Medicine

## 2012-07-05 LAB — URINALYSIS, COMPLETE
Bilirubin,UR: NEGATIVE
Glucose,UR: NEGATIVE mg/dL (ref 0–75)
Ketone: NEGATIVE
Nitrite: NEGATIVE
Ph: 5 (ref 4.5–8.0)
Protein: 30
RBC,UR: 453 /HPF (ref 0–5)
Specific Gravity: 1.012 (ref 1.003–1.030)
Squamous Epithelial: 3
WBC UR: 11 /HPF (ref 0–5)

## 2012-07-05 LAB — CBC WITH DIFFERENTIAL/PLATELET
Basophil #: 0.1 10*3/uL (ref 0.0–0.1)
Basophil %: 0.9 %
Eosinophil #: 0.1 10*3/uL (ref 0.0–0.7)
Eosinophil %: 1 %
HCT: 35.4 % (ref 35.0–47.0)
HGB: 11.9 g/dL — ABNORMAL LOW (ref 12.0–16.0)
Lymphocyte #: 1.2 10*3/uL (ref 1.0–3.6)
Lymphocyte %: 19.7 %
MCH: 29.2 pg (ref 26.0–34.0)
MCHC: 33.7 g/dL (ref 32.0–36.0)
MCV: 87 fL (ref 80–100)
Monocyte #: 0.6 x10 3/mm (ref 0.2–0.9)
Monocyte %: 9.3 %
Neutrophil #: 4.3 10*3/uL (ref 1.4–6.5)
Neutrophil %: 69.1 %
Platelet: 309 10*3/uL (ref 150–440)
RBC: 4.09 10*6/uL (ref 3.80–5.20)
RDW: 13.9 % (ref 11.5–14.5)
WBC: 6.3 10*3/uL (ref 3.6–11.0)

## 2012-07-05 LAB — COMPREHENSIVE METABOLIC PANEL
Albumin: 3.3 g/dL — ABNORMAL LOW (ref 3.4–5.0)
Alkaline Phosphatase: 41 U/L — ABNORMAL LOW (ref 50–136)
Anion Gap: 6 — ABNORMAL LOW (ref 7–16)
BUN: 10 mg/dL (ref 7–18)
Bilirubin,Total: 0.5 mg/dL (ref 0.2–1.0)
Calcium, Total: 8.7 mg/dL (ref 8.5–10.1)
Chloride: 108 mmol/L — ABNORMAL HIGH (ref 98–107)
Co2: 26 mmol/L (ref 21–32)
Creatinine: 0.66 mg/dL (ref 0.60–1.30)
EGFR (African American): >60
EGFR (Non-African Amer.): >60
Glucose: 69 mg/dL (ref 65–99)
Osmolality: 277 (ref 275–301)
Potassium: 3.4 mmol/L — ABNORMAL LOW (ref 3.5–5.1)
SGOT(AST): 19 U/L (ref 15–37)
SGPT (ALT): 18 U/L (ref 12–78)
Sodium: 140 mmol/L (ref 136–145)
Total Protein: 7 g/dL (ref 6.4–8.2)

## 2012-07-05 LAB — GC/CHLAMYDIA PROBE AMP

## 2012-07-05 LAB — WET PREP, GENITAL

## 2012-07-21 ENCOUNTER — Emergency Department: Payer: Self-pay | Admitting: Emergency Medicine

## 2012-07-21 LAB — COMPREHENSIVE METABOLIC PANEL
Albumin: 3 g/dL — ABNORMAL LOW (ref 3.4–5.0)
Alkaline Phosphatase: 39 U/L — ABNORMAL LOW (ref 50–136)
Anion Gap: 3 — ABNORMAL LOW (ref 7–16)
BUN: 9 mg/dL (ref 7–18)
Bilirubin,Total: 0.3 mg/dL (ref 0.2–1.0)
Calcium, Total: 8.5 mg/dL (ref 8.5–10.1)
Chloride: 109 mmol/L — ABNORMAL HIGH (ref 98–107)
Co2: 28 mmol/L (ref 21–32)
Creatinine: 0.55 mg/dL — ABNORMAL LOW (ref 0.60–1.30)
EGFR (African American): >60
EGFR (Non-African Amer.): >60
Glucose: 77 mg/dL (ref 65–99)
Osmolality: 277 (ref 275–301)
Potassium: 3.8 mmol/L (ref 3.5–5.1)
SGOT(AST): 23 U/L (ref 15–37)
SGPT (ALT): 18 U/L (ref 12–78)
Sodium: 140 mmol/L (ref 136–145)
Total Protein: 6.7 g/dL (ref 6.4–8.2)

## 2012-07-21 LAB — CBC
HCT: 35.3 % (ref 35.0–47.0)
HGB: 11.7 g/dL — ABNORMAL LOW (ref 12.0–16.0)
MCH: 28.5 pg (ref 26.0–34.0)
MCHC: 33 g/dL (ref 32.0–36.0)
MCV: 86 fL (ref 80–100)
Platelet: 277 10*3/uL (ref 150–440)
RBC: 4.09 10*6/uL (ref 3.80–5.20)
RDW: 13.5 % (ref 11.5–14.5)
WBC: 10.6 10*3/uL (ref 3.6–11.0)

## 2012-08-31 ENCOUNTER — Ambulatory Visit: Payer: Self-pay | Admitting: Family Medicine

## 2012-08-31 DIAGNOSIS — E669 Obesity, unspecified: Secondary | ICD-10-CM | POA: Insufficient documentation

## 2012-08-31 DIAGNOSIS — N3946 Mixed incontinence: Secondary | ICD-10-CM | POA: Insufficient documentation

## 2012-09-05 ENCOUNTER — Ambulatory Visit: Payer: Self-pay | Admitting: Oncology

## 2012-09-07 LAB — IRON AND TIBC
Iron Bind.Cap.(Total): 586 ug/dL — ABNORMAL HIGH (ref 250–450)
Iron Saturation: 13 %
Iron: 78 ug/dL (ref 50–170)
Unbound Iron-Bind.Cap.: 508 ug/dL

## 2012-09-07 LAB — CBC CANCER CENTER
Basophil #: 0 10*3/uL
Basophil %: 0.8 %
Eosinophil #: 0.1 10*3/uL
Eosinophil %: 2.2 %
HCT: 35.5 %
HGB: 11.8 g/dL — ABNORMAL LOW
Lymphocyte %: 22.1 %
Lymphs Abs: 1.4 10*3/uL
MCH: 28.2 pg
MCHC: 33.1 g/dL
MCV: 85 fL
Monocyte #: 0.4 10*3/uL
Monocyte %: 6.4 %
Neutrophil #: 4.5 10*3/uL
Neutrophil %: 68.5 %
Platelet: 339 10*3/uL
RBC: 4.17 10*6/uL
RDW: 13.9 %
WBC: 6.5 10*3/uL

## 2012-09-07 LAB — FERRITIN: Ferritin (ARMC): 6 ng/mL — ABNORMAL LOW

## 2012-09-15 ENCOUNTER — Ambulatory Visit: Payer: Self-pay | Admitting: Oncology

## 2012-10-12 LAB — CBC CANCER CENTER
Basophil #: 0 x10 3/mm (ref 0.0–0.1)
Basophil %: 0.6 %
Eosinophil #: 0.2 x10 3/mm (ref 0.0–0.7)
Eosinophil %: 2.9 %
HCT: 36.6 % (ref 35.0–47.0)
HGB: 12.4 g/dL (ref 12.0–16.0)
Lymphocyte #: 1.5 x10 3/mm (ref 1.0–3.6)
Lymphocyte %: 25.6 %
MCH: 29.4 pg (ref 26.0–34.0)
MCHC: 33.8 g/dL (ref 32.0–36.0)
MCV: 87 fL (ref 80–100)
Monocyte #: 0.4 x10 3/mm (ref 0.2–0.9)
Monocyte %: 6.5 %
Neutrophil #: 3.8 x10 3/mm (ref 1.4–6.5)
Neutrophil %: 64.4 %
Platelet: 279 x10 3/mm (ref 150–440)
RBC: 4.21 10*6/uL (ref 3.80–5.20)
RDW: 15.6 % — ABNORMAL HIGH (ref 11.5–14.5)
WBC: 5.9 x10 3/mm (ref 3.6–11.0)

## 2012-10-12 LAB — FERRITIN: Ferritin (ARMC): 32 ng/mL (ref 8–388)

## 2012-10-12 LAB — IRON AND TIBC
Iron Bind.Cap.(Total): 441 ug/dL (ref 250–450)
Iron Saturation: 22 %
Iron: 96 ug/dL (ref 50–170)
Unbound Iron-Bind.Cap.: 345 ug/dL

## 2012-10-16 ENCOUNTER — Ambulatory Visit: Payer: Self-pay | Admitting: Oncology

## 2012-12-12 ENCOUNTER — Ambulatory Visit: Payer: Self-pay | Admitting: Oncology

## 2012-12-13 LAB — CBC CANCER CENTER
Basophil #: 0 x10 3/mm (ref 0.0–0.1)
Basophil %: 0.7 %
Eosinophil #: 0.2 x10 3/mm (ref 0.0–0.7)
Eosinophil %: 3.1 %
HCT: 41.3 % (ref 35.0–47.0)
HGB: 13.5 g/dL (ref 12.0–16.0)
Lymphocyte #: 1.4 x10 3/mm (ref 1.0–3.6)
Lymphocyte %: 23.3 %
MCH: 28.8 pg (ref 26.0–34.0)
MCHC: 32.6 g/dL (ref 32.0–36.0)
MCV: 88 fL (ref 80–100)
Monocyte #: 0.4 x10 3/mm (ref 0.2–0.9)
Monocyte %: 6.7 %
Neutrophil #: 4 x10 3/mm (ref 1.4–6.5)
Neutrophil %: 66.2 %
Platelet: 298 x10 3/mm (ref 150–440)
RBC: 4.69 10*6/uL (ref 3.80–5.20)
RDW: 13.1 % (ref 11.5–14.5)
WBC: 6.1 x10 3/mm (ref 3.6–11.0)

## 2012-12-13 LAB — IRON AND TIBC
Iron Bind.Cap.(Total): 391 ug/dL (ref 250–450)
Iron Saturation: 27 %
Iron: 106 ug/dL (ref 50–170)
Unbound Iron-Bind.Cap.: 285 ug/dL

## 2012-12-13 LAB — FERRITIN: Ferritin (ARMC): 22 ng/mL (ref 8–388)

## 2012-12-15 ENCOUNTER — Ambulatory Visit: Payer: Self-pay | Admitting: Podiatry

## 2012-12-16 ENCOUNTER — Ambulatory Visit: Payer: Self-pay | Admitting: Oncology

## 2012-12-26 ENCOUNTER — Ambulatory Visit: Payer: Self-pay | Admitting: Podiatry

## 2013-01-13 ENCOUNTER — Emergency Department: Payer: Self-pay | Admitting: Emergency Medicine

## 2013-01-13 LAB — CBC
HCT: 38 % (ref 35.0–47.0)
HGB: 12.8 g/dL (ref 12.0–16.0)
MCH: 29.4 pg (ref 26.0–34.0)
MCHC: 33.6 g/dL (ref 32.0–36.0)
MCV: 87 fL (ref 80–100)
Platelet: 285 10*3/uL (ref 150–440)
RBC: 4.35 10*6/uL (ref 3.80–5.20)
RDW: 12.9 % (ref 11.5–14.5)
WBC: 7 10*3/uL (ref 3.6–11.0)

## 2013-01-13 LAB — GC/CHLAMYDIA PROBE AMP

## 2013-01-13 LAB — WET PREP, GENITAL

## 2013-01-13 LAB — HCG, QUANTITATIVE, PREGNANCY: Beta Hcg, Quant.: <1 m[IU]/mL — ABNORMAL LOW

## 2013-01-16 ENCOUNTER — Emergency Department: Payer: Self-pay | Admitting: Emergency Medicine

## 2013-01-16 LAB — URINALYSIS, COMPLETE
Bilirubin,UR: NEGATIVE
Glucose,UR: NEGATIVE mg/dL (ref 0–75)
Ketone: NEGATIVE
Leukocyte Esterase: NEGATIVE
Nitrite: NEGATIVE
Ph: 5 (ref 4.5–8.0)
Protein: 100
RBC,UR: 8657 /HPF (ref 0–5)
Specific Gravity: 1.027 (ref 1.003–1.030)
Squamous Epithelial: 8
WBC UR: 23 /HPF (ref 0–5)

## 2013-01-16 LAB — CBC WITH DIFFERENTIAL/PLATELET
Basophil #: 0 10*3/uL (ref 0.0–0.1)
Basophil %: 0.6 %
Eosinophil #: 0.3 10*3/uL (ref 0.0–0.7)
Eosinophil %: 4.3 %
HCT: 37 % (ref 35.0–47.0)
HGB: 12.3 g/dL (ref 12.0–16.0)
Lymphocyte #: 1.5 10*3/uL (ref 1.0–3.6)
Lymphocyte %: 23.6 %
MCH: 29.2 pg (ref 26.0–34.0)
MCHC: 33.2 g/dL (ref 32.0–36.0)
MCV: 88 fL (ref 80–100)
Monocyte #: 0.4 x10 3/mm (ref 0.2–0.9)
Monocyte %: 6.7 %
Neutrophil #: 4 10*3/uL (ref 1.4–6.5)
Neutrophil %: 64.8 %
Platelet: 288 10*3/uL (ref 150–440)
RBC: 4.21 10*6/uL (ref 3.80–5.20)
RDW: 13.3 % (ref 11.5–14.5)
WBC: 6.2 10*3/uL (ref 3.6–11.0)

## 2013-01-16 LAB — COMPREHENSIVE METABOLIC PANEL
Albumin: 3.2 g/dL — ABNORMAL LOW (ref 3.4–5.0)
Alkaline Phosphatase: 75 U/L
Anion Gap: 4 — ABNORMAL LOW (ref 7–16)
BUN: 12 mg/dL (ref 7–18)
Bilirubin,Total: 0.3 mg/dL (ref 0.2–1.0)
Calcium, Total: 8.5 mg/dL (ref 8.5–10.1)
Chloride: 109 mmol/L — ABNORMAL HIGH (ref 98–107)
Co2: 26 mmol/L (ref 21–32)
Creatinine: 0.53 mg/dL — ABNORMAL LOW (ref 0.60–1.30)
EGFR (African American): >60
EGFR (Non-African Amer.): >60
Glucose: 112 mg/dL — ABNORMAL HIGH (ref 65–99)
Osmolality: 278 (ref 275–301)
Potassium: 3.7 mmol/L (ref 3.5–5.1)
SGOT(AST): 74 U/L — ABNORMAL HIGH (ref 15–37)
SGPT (ALT): 246 U/L — ABNORMAL HIGH (ref 12–78)
Sodium: 139 mmol/L (ref 136–145)
Total Protein: 6.8 g/dL (ref 6.4–8.2)

## 2013-01-16 LAB — LIPASE, BLOOD: Lipase: 117 U/L (ref 73–393)

## 2013-01-18 LAB — URINE CULTURE

## 2013-01-31 ENCOUNTER — Ambulatory Visit: Payer: Self-pay | Admitting: Oncology

## 2013-01-31 LAB — CBC CANCER CENTER
Basophil #: 0.1 x10 3/mm (ref 0.0–0.1)
Basophil %: 0.8 %
Eosinophil #: 0.2 x10 3/mm (ref 0.0–0.7)
Eosinophil %: 2.7 %
HCT: 41.2 % (ref 35.0–47.0)
HGB: 13.3 g/dL (ref 12.0–16.0)
Lymphocyte #: 1.4 x10 3/mm (ref 1.0–3.6)
Lymphocyte %: 20.8 %
MCH: 28.6 pg (ref 26.0–34.0)
MCHC: 32.2 g/dL (ref 32.0–36.0)
MCV: 89 fL (ref 80–100)
Monocyte #: 0.4 x10 3/mm (ref 0.2–0.9)
Monocyte %: 6.4 %
Neutrophil #: 4.7 x10 3/mm (ref 1.4–6.5)
Neutrophil %: 69.3 %
Platelet: 318 x10 3/mm (ref 150–440)
RBC: 4.63 10*6/uL (ref 3.80–5.20)
RDW: 13.4 % (ref 11.5–14.5)
WBC: 6.7 x10 3/mm (ref 3.6–11.0)

## 2013-01-31 LAB — IRON: Iron: 89 ug/dL (ref 50–170)

## 2013-01-31 LAB — FERRITIN: Ferritin (ARMC): 21 ng/mL (ref 8–388)

## 2013-02-15 ENCOUNTER — Ambulatory Visit: Payer: Self-pay | Admitting: Oncology

## 2013-03-29 ENCOUNTER — Emergency Department: Payer: Self-pay | Admitting: Emergency Medicine

## 2013-03-29 LAB — COMPREHENSIVE METABOLIC PANEL
Albumin: 3.5 g/dL (ref 3.4–5.0)
Alkaline Phosphatase: 31 U/L — ABNORMAL LOW
Anion Gap: 6 — ABNORMAL LOW (ref 7–16)
BUN: 15 mg/dL (ref 7–18)
Bilirubin,Total: 0.4 mg/dL (ref 0.2–1.0)
Calcium, Total: 9.1 mg/dL (ref 8.5–10.1)
Chloride: 105 mmol/L (ref 98–107)
Co2: 24 mmol/L (ref 21–32)
Creatinine: 0.74 mg/dL (ref 0.60–1.30)
EGFR (African American): >60
EGFR (Non-African Amer.): >60
Glucose: 92 mg/dL (ref 65–99)
Osmolality: 271 (ref 275–301)
Potassium: 3.9 mmol/L (ref 3.5–5.1)
SGOT(AST): 16 U/L (ref 15–37)
SGPT (ALT): 18 U/L (ref 12–78)
Sodium: 135 mmol/L — ABNORMAL LOW (ref 136–145)
Total Protein: 7.3 g/dL (ref 6.4–8.2)

## 2013-03-29 LAB — URINALYSIS, COMPLETE
Bacteria: NONE SEEN
Bilirubin,UR: NEGATIVE
Blood: NEGATIVE
Glucose,UR: NEGATIVE mg/dL (ref 0–75)
Ketone: NEGATIVE
Leukocyte Esterase: NEGATIVE
Nitrite: NEGATIVE
Ph: 5 (ref 4.5–8.0)
Protein: NEGATIVE
RBC,UR: NONE SEEN /HPF (ref 0–5)
Specific Gravity: 1.023 (ref 1.003–1.030)
Squamous Epithelial: 4
WBC UR: 3 /HPF (ref 0–5)

## 2013-03-29 LAB — CBC WITH DIFFERENTIAL/PLATELET
Basophil #: 0 10*3/uL (ref 0.0–0.1)
Basophil %: 0.4 %
Eosinophil #: 0.1 10*3/uL (ref 0.0–0.7)
Eosinophil %: 1.2 %
HCT: 38.6 % (ref 35.0–47.0)
HGB: 12.9 g/dL (ref 12.0–16.0)
Lymphocyte #: 1.5 10*3/uL (ref 1.0–3.6)
Lymphocyte %: 16.1 %
MCH: 29.7 pg (ref 26.0–34.0)
MCHC: 33.5 g/dL (ref 32.0–36.0)
MCV: 89 fL (ref 80–100)
Monocyte #: 0.7 x10 3/mm (ref 0.2–0.9)
Monocyte %: 7.1 %
Neutrophil #: 6.9 10*3/uL — ABNORMAL HIGH (ref 1.4–6.5)
Neutrophil %: 75.2 %
Platelet: 293 10*3/uL (ref 150–440)
RBC: 4.35 10*6/uL (ref 3.80–5.20)
RDW: 13.5 % (ref 11.5–14.5)
WBC: 9.2 10*3/uL (ref 3.6–11.0)

## 2013-03-29 LAB — LIPASE, BLOOD: Lipase: 158 U/L (ref 73–393)

## 2013-03-30 LAB — TROPONIN I: Troponin-I: <0.02 ng/mL

## 2013-05-08 ENCOUNTER — Ambulatory Visit: Payer: Self-pay | Admitting: Gastroenterology

## 2013-05-11 LAB — PATHOLOGY REPORT

## 2013-05-21 ENCOUNTER — Ambulatory Visit: Payer: Self-pay | Admitting: Oncology

## 2013-05-21 LAB — CBC CANCER CENTER
Basophil #: 0 x10 3/mm (ref 0.0–0.1)
Basophil %: 0.8 %
Eosinophil #: 0.2 x10 3/mm (ref 0.0–0.7)
Eosinophil %: 3.4 %
HCT: 37.9 % (ref 35.0–47.0)
HGB: 12.3 g/dL (ref 12.0–16.0)
Lymphocyte #: 1.5 x10 3/mm (ref 1.0–3.6)
Lymphocyte %: 26.3 %
MCH: 28.7 pg (ref 26.0–34.0)
MCHC: 32.5 g/dL (ref 32.0–36.0)
MCV: 88 fL (ref 80–100)
Monocyte #: 0.5 x10 3/mm (ref 0.2–0.9)
Monocyte %: 8.3 %
Neutrophil #: 3.5 x10 3/mm (ref 1.4–6.5)
Neutrophil %: 61.2 %
Platelet: 262 x10 3/mm (ref 150–440)
RBC: 4.29 10*6/uL (ref 3.80–5.20)
RDW: 13.3 % (ref 11.5–14.5)
WBC: 5.8 x10 3/mm (ref 3.6–11.0)

## 2013-05-21 LAB — IRON AND TIBC
Iron Bind.Cap.(Total): 382 ug/dL (ref 250–450)
Iron Saturation: 26 %
Iron: 98 ug/dL (ref 50–170)
Unbound Iron-Bind.Cap.: 284 ug/dL

## 2013-05-21 LAB — FERRITIN: Ferritin (ARMC): 5 ng/mL — ABNORMAL LOW (ref 8–388)

## 2013-06-06 ENCOUNTER — Ambulatory Visit: Payer: Self-pay | Admitting: Obstetrics and Gynecology

## 2013-06-06 LAB — BASIC METABOLIC PANEL
Anion Gap: 5 — ABNORMAL LOW (ref 7–16)
BUN: 12 mg/dL (ref 7–18)
Calcium, Total: 8.9 mg/dL (ref 8.5–10.1)
Chloride: 109 mmol/L — ABNORMAL HIGH (ref 98–107)
Co2: 27 mmol/L (ref 21–32)
Creatinine: 0.9 mg/dL (ref 0.60–1.30)
EGFR (African American): >60
EGFR (Non-African Amer.): >60
Glucose: 99 mg/dL (ref 65–99)
Osmolality: 281 (ref 275–301)
Potassium: 3.4 mmol/L — ABNORMAL LOW (ref 3.5–5.1)
Sodium: 141 mmol/L (ref 136–145)

## 2013-06-06 LAB — CBC
HCT: 36.4 % (ref 35.0–47.0)
HGB: 11.9 g/dL — ABNORMAL LOW (ref 12.0–16.0)
MCH: 29.1 pg (ref 26.0–34.0)
MCHC: 32.6 g/dL (ref 32.0–36.0)
MCV: 89 fL (ref 80–100)
Platelet: 297 10*3/uL (ref 150–440)
RBC: 4.07 10*6/uL (ref 3.80–5.20)
RDW: 13.2 % (ref 11.5–14.5)
WBC: 6.1 10*3/uL (ref 3.6–11.0)

## 2013-06-15 ENCOUNTER — Ambulatory Visit: Payer: Self-pay | Admitting: Oncology

## 2013-06-21 ENCOUNTER — Ambulatory Visit: Payer: Self-pay | Admitting: Obstetrics and Gynecology

## 2013-06-22 ENCOUNTER — Emergency Department: Payer: Self-pay | Admitting: Emergency Medicine

## 2013-06-22 LAB — URINALYSIS, COMPLETE
Bacteria: NONE SEEN
Bilirubin,UR: NEGATIVE
Glucose,UR: NEGATIVE mg/dL (ref 0–75)
Ketone: NEGATIVE
Nitrite: NEGATIVE
Ph: 6 (ref 4.5–8.0)
Protein: 25
RBC,UR: 202 /HPF (ref 0–5)
Specific Gravity: 1.02 (ref 1.003–1.030)
Squamous Epithelial: 20
WBC UR: 86 /HPF (ref 0–5)

## 2013-06-22 LAB — COMPREHENSIVE METABOLIC PANEL
Albumin: 3.1 g/dL — ABNORMAL LOW (ref 3.4–5.0)
Alkaline Phosphatase: 32 U/L — ABNORMAL LOW
Anion Gap: 5 — ABNORMAL LOW (ref 7–16)
BUN: 7 mg/dL (ref 7–18)
Bilirubin,Total: 0.5 mg/dL (ref 0.2–1.0)
Calcium, Total: 8.2 mg/dL — ABNORMAL LOW (ref 8.5–10.1)
Chloride: 111 mmol/L — ABNORMAL HIGH (ref 98–107)
Co2: 25 mmol/L (ref 21–32)
Creatinine: 0.8 mg/dL (ref 0.60–1.30)
EGFR (African American): >60
EGFR (Non-African Amer.): >60
Glucose: 83 mg/dL (ref 65–99)
Osmolality: 278 (ref 275–301)
Potassium: 3.5 mmol/L (ref 3.5–5.1)
SGOT(AST): 69 U/L — ABNORMAL HIGH (ref 15–37)
SGPT (ALT): 88 U/L — ABNORMAL HIGH (ref 12–78)
Sodium: 141 mmol/L (ref 136–145)
Total Protein: 6.6 g/dL (ref 6.4–8.2)

## 2013-06-22 LAB — CBC
HCT: 35.2 % (ref 35.0–47.0)
HGB: 11 g/dL — ABNORMAL LOW (ref 12.0–16.0)
MCH: 27.9 pg (ref 26.0–34.0)
MCHC: 31.4 g/dL — ABNORMAL LOW (ref 32.0–36.0)
MCV: 89 fL (ref 80–100)
Platelet: 314 10*3/uL (ref 150–440)
RBC: 3.96 10*6/uL (ref 3.80–5.20)
RDW: 13.1 % (ref 11.5–14.5)
WBC: 8.4 10*3/uL (ref 3.6–11.0)

## 2013-06-22 LAB — PATHOLOGY REPORT

## 2013-06-24 ENCOUNTER — Emergency Department: Payer: Self-pay | Admitting: Emergency Medicine

## 2013-06-24 LAB — COMPREHENSIVE METABOLIC PANEL
Albumin: 2.9 g/dL — ABNORMAL LOW (ref 3.4–5.0)
Alkaline Phosphatase: 27 U/L — ABNORMAL LOW
Anion Gap: 4 — ABNORMAL LOW (ref 7–16)
BUN: 10 mg/dL (ref 7–18)
Bilirubin,Total: 0.2 mg/dL (ref 0.2–1.0)
Calcium, Total: 8.5 mg/dL (ref 8.5–10.1)
Chloride: 106 mmol/L (ref 98–107)
Co2: 30 mmol/L (ref 21–32)
Creatinine: 0.57 mg/dL — ABNORMAL LOW (ref 0.60–1.30)
EGFR (African American): >60
EGFR (Non-African Amer.): >60
Glucose: 82 mg/dL (ref 65–99)
Osmolality: 278 (ref 275–301)
Potassium: 3.6 mmol/L (ref 3.5–5.1)
SGOT(AST): 25 U/L (ref 15–37)
SGPT (ALT): 49 U/L (ref 12–78)
Sodium: 140 mmol/L (ref 136–145)
Total Protein: 6.7 g/dL (ref 6.4–8.2)

## 2013-06-24 LAB — CBC
HCT: 33.8 % — ABNORMAL LOW (ref 35.0–47.0)
HGB: 11.1 g/dL — ABNORMAL LOW (ref 12.0–16.0)
MCH: 29.1 pg (ref 26.0–34.0)
MCHC: 32.7 g/dL (ref 32.0–36.0)
MCV: 89 fL (ref 80–100)
Platelet: 283 10*3/uL (ref 150–440)
RBC: 3.8 10*6/uL (ref 3.80–5.20)
RDW: 13.3 % (ref 11.5–14.5)
WBC: 6 10*3/uL (ref 3.6–11.0)

## 2013-06-24 LAB — TROPONIN I: Troponin-I: <0.02 ng/mL

## 2013-06-27 ENCOUNTER — Emergency Department: Payer: Self-pay | Admitting: Emergency Medicine

## 2013-06-27 LAB — BASIC METABOLIC PANEL
Anion Gap: 9 (ref 7–16)
BUN: 8 mg/dL (ref 7–18)
Calcium, Total: 8.4 mg/dL — ABNORMAL LOW (ref 8.5–10.1)
Chloride: 105 mmol/L (ref 98–107)
Co2: 26 mmol/L (ref 21–32)
Creatinine: 0.53 mg/dL — ABNORMAL LOW (ref 0.60–1.30)
EGFR (African American): >60
EGFR (Non-African Amer.): >60
Glucose: 86 mg/dL (ref 65–99)
Osmolality: 277 (ref 275–301)
Potassium: 3.7 mmol/L (ref 3.5–5.1)
Sodium: 140 mmol/L (ref 136–145)

## 2013-06-27 LAB — URINALYSIS, COMPLETE
Bilirubin,UR: NEGATIVE
Glucose,UR: NEGATIVE mg/dL (ref 0–75)
Ketone: NEGATIVE
Nitrite: NEGATIVE
Ph: 6 (ref 4.5–8.0)
Protein: NEGATIVE
RBC,UR: 5 /HPF (ref 0–5)
Specific Gravity: 1.013 (ref 1.003–1.030)
Squamous Epithelial: 10
WBC UR: 5 /HPF (ref 0–5)

## 2013-06-27 LAB — CBC
HCT: 35 % (ref 35.0–47.0)
HGB: 11.6 g/dL — ABNORMAL LOW (ref 12.0–16.0)
MCH: 29 pg (ref 26.0–34.0)
MCHC: 33 g/dL (ref 32.0–36.0)
MCV: 88 fL (ref 80–100)
Platelet: 327 10*3/uL (ref 150–440)
RBC: 3.98 10*6/uL (ref 3.80–5.20)
RDW: 13.5 % (ref 11.5–14.5)
WBC: 7.2 10*3/uL (ref 3.6–11.0)

## 2013-06-27 LAB — TROPONIN I: Troponin-I: <0.02 ng/mL

## 2013-06-28 ENCOUNTER — Ambulatory Visit: Payer: Self-pay | Admitting: Oncology

## 2013-07-04 ENCOUNTER — Emergency Department: Payer: Self-pay | Admitting: Emergency Medicine

## 2013-07-04 LAB — BASIC METABOLIC PANEL
Anion Gap: 7 (ref 7–16)
BUN: 8 mg/dL (ref 7–18)
Calcium, Total: 8.4 mg/dL — ABNORMAL LOW (ref 8.5–10.1)
Chloride: 103 mmol/L (ref 98–107)
Co2: 30 mmol/L (ref 21–32)
Creatinine: 0.64 mg/dL (ref 0.60–1.30)
EGFR (African American): >60
EGFR (Non-African Amer.): >60
Glucose: 85 mg/dL (ref 65–99)
Osmolality: 277 (ref 275–301)
Potassium: 3.2 mmol/L — ABNORMAL LOW (ref 3.5–5.1)
Sodium: 140 mmol/L (ref 136–145)

## 2013-07-04 LAB — URINALYSIS, COMPLETE
Bacteria: NONE SEEN
Bilirubin,UR: NEGATIVE
Glucose,UR: NEGATIVE mg/dL (ref 0–75)
Ketone: NEGATIVE
Leukocyte Esterase: NEGATIVE
Nitrite: NEGATIVE
Ph: 7 (ref 4.5–8.0)
Protein: NEGATIVE
RBC,UR: NONE SEEN /HPF (ref 0–5)
Specific Gravity: 1.001 (ref 1.003–1.030)
Squamous Epithelial: 6
WBC UR: 1 /HPF (ref 0–5)

## 2013-07-04 LAB — CBC
HCT: 34.2 % — ABNORMAL LOW (ref 35.0–47.0)
HGB: 11.1 g/dL — ABNORMAL LOW (ref 12.0–16.0)
MCH: 28.2 pg (ref 26.0–34.0)
MCHC: 32.6 g/dL (ref 32.0–36.0)
MCV: 87 fL (ref 80–100)
Platelet: 381 10*3/uL (ref 150–440)
RBC: 3.95 10*6/uL (ref 3.80–5.20)
RDW: 13.3 % (ref 11.5–14.5)
WBC: 9.8 10*3/uL (ref 3.6–11.0)

## 2013-07-20 ENCOUNTER — Emergency Department: Payer: Self-pay | Admitting: Emergency Medicine

## 2013-07-20 LAB — URINALYSIS, COMPLETE
Bacteria: NONE SEEN
Bilirubin,UR: NEGATIVE
Glucose,UR: NEGATIVE mg/dL (ref 0–75)
Ketone: NEGATIVE
Leukocyte Esterase: NEGATIVE
Nitrite: NEGATIVE
Ph: 6 (ref 4.5–8.0)
Protein: 30
RBC,UR: 2608 /HPF (ref 0–5)
Specific Gravity: 1.054 (ref 1.003–1.030)
Squamous Epithelial: 2
WBC UR: NONE SEEN /HPF (ref 0–5)

## 2013-07-20 LAB — COMPREHENSIVE METABOLIC PANEL
Albumin: 3.4 g/dL (ref 3.4–5.0)
Alkaline Phosphatase: 27 U/L — ABNORMAL LOW
Anion Gap: 8 (ref 7–16)
BUN: 9 mg/dL (ref 7–18)
Bilirubin,Total: 0.5 mg/dL (ref 0.2–1.0)
Calcium, Total: 9 mg/dL (ref 8.5–10.1)
Chloride: 108 mmol/L — ABNORMAL HIGH (ref 98–107)
Co2: 24 mmol/L (ref 21–32)
Creatinine: 0.54 mg/dL — ABNORMAL LOW (ref 0.60–1.30)
EGFR (African American): >60
EGFR (Non-African Amer.): >60
Glucose: 82 mg/dL (ref 65–99)
Osmolality: 277 (ref 275–301)
Potassium: 3.4 mmol/L — ABNORMAL LOW (ref 3.5–5.1)
SGOT(AST): 14 U/L — ABNORMAL LOW (ref 15–37)
SGPT (ALT): 19 U/L (ref 12–78)
Sodium: 140 mmol/L (ref 136–145)
Total Protein: 7 g/dL (ref 6.4–8.2)

## 2013-07-20 LAB — TROPONIN I: Troponin-I: <0.02 ng/mL

## 2013-07-20 LAB — DRUG SCREEN, URINE

## 2013-07-20 LAB — CBC
HCT: 35.2 % (ref 35.0–47.0)
HGB: 11.5 g/dL — ABNORMAL LOW (ref 12.0–16.0)
MCH: 28.4 pg (ref 26.0–34.0)
MCHC: 32.7 g/dL (ref 32.0–36.0)
MCV: 87 fL (ref 80–100)
Platelet: 287 10*3/uL (ref 150–440)
RBC: 4.05 10*6/uL (ref 3.80–5.20)
RDW: 13.9 % (ref 11.5–14.5)
WBC: 5 10*3/uL (ref 3.6–11.0)

## 2013-07-20 LAB — PRO B NATRIURETIC PEPTIDE: B-Type Natriuretic Peptide: 79 pg/mL (ref 0–125)

## 2013-07-20 LAB — D-DIMER(ARMC): D-Dimer: 533 ng/ml

## 2013-08-09 ENCOUNTER — Ambulatory Visit: Payer: Self-pay | Admitting: Oncology

## 2013-08-09 LAB — FERRITIN: Ferritin (ARMC): 4 ng/mL — ABNORMAL LOW (ref 8–388)

## 2013-08-09 LAB — IRON AND TIBC
Iron Bind.Cap.(Total): 472 ug/dL — ABNORMAL HIGH (ref 250–450)
Iron Saturation: 9 %
Iron: 43 ug/dL — ABNORMAL LOW (ref 50–170)
Unbound Iron-Bind.Cap.: 429 ug/dL

## 2013-08-09 LAB — CANCER CENTER HEMOGLOBIN: HGB: 11.6 g/dL — ABNORMAL LOW (ref 12.0–16.0)

## 2013-08-15 ENCOUNTER — Ambulatory Visit: Payer: Self-pay | Admitting: Family Medicine

## 2013-08-15 ENCOUNTER — Ambulatory Visit: Payer: Self-pay | Admitting: Oncology

## 2013-08-29 ENCOUNTER — Emergency Department: Payer: Self-pay | Admitting: Emergency Medicine

## 2013-08-29 LAB — COMPREHENSIVE METABOLIC PANEL
Albumin: 3.9 g/dL (ref 3.4–5.0)
Alkaline Phosphatase: 29 U/L — ABNORMAL LOW
Anion Gap: 8 (ref 7–16)
BUN: 14 mg/dL (ref 7–18)
Bilirubin,Total: 0.4 mg/dL (ref 0.2–1.0)
Calcium, Total: 9.1 mg/dL (ref 8.5–10.1)
Chloride: 102 mmol/L (ref 98–107)
Co2: 24 mmol/L (ref 21–32)
Creatinine: 0.75 mg/dL (ref 0.60–1.30)
EGFR (African American): >60
EGFR (Non-African Amer.): >60
Glucose: 91 mg/dL (ref 65–99)
Osmolality: 268 (ref 275–301)
Potassium: 3.7 mmol/L (ref 3.5–5.1)
SGOT(AST): 22 U/L (ref 15–37)
SGPT (ALT): 21 U/L (ref 12–78)
Sodium: 134 mmol/L — ABNORMAL LOW (ref 136–145)
Total Protein: 8 g/dL (ref 6.4–8.2)

## 2013-08-29 LAB — HCG, QUANTITATIVE, PREGNANCY: Beta Hcg, Quant.: 34723 m[IU]/mL — ABNORMAL HIGH

## 2013-08-29 LAB — CBC
HCT: 39.9 % (ref 35.0–47.0)
HGB: 12.7 g/dL (ref 12.0–16.0)
MCH: 27.9 pg (ref 26.0–34.0)
MCHC: 31.8 g/dL — ABNORMAL LOW (ref 32.0–36.0)
MCV: 88 fL (ref 80–100)
Platelet: 301 10*3/uL (ref 150–440)
RBC: 4.54 10*6/uL (ref 3.80–5.20)
RDW: 15.2 % — ABNORMAL HIGH (ref 11.5–14.5)
WBC: 9.2 10*3/uL (ref 3.6–11.0)

## 2013-08-29 LAB — URINALYSIS, COMPLETE
Bilirubin,UR: NEGATIVE
Glucose,UR: NEGATIVE mg/dL (ref 0–75)
Nitrite: NEGATIVE
Ph: 5 (ref 4.5–8.0)
Protein: NEGATIVE
RBC,UR: 15 /HPF (ref 0–5)
Specific Gravity: 1.015 (ref 1.003–1.030)
Squamous Epithelial: 2
WBC UR: 25 /HPF (ref 0–5)

## 2013-09-15 ENCOUNTER — Ambulatory Visit: Payer: Self-pay | Admitting: Oncology

## 2013-09-15 ENCOUNTER — Ambulatory Visit: Payer: Self-pay | Admitting: Family Medicine

## 2013-10-17 ENCOUNTER — Ambulatory Visit: Payer: Self-pay | Admitting: Obstetrics and Gynecology

## 2013-10-25 ENCOUNTER — Emergency Department: Payer: Self-pay | Admitting: Emergency Medicine

## 2013-10-25 LAB — CBC
HCT: 39 % (ref 35.0–47.0)
HGB: 12.4 g/dL (ref 12.0–16.0)
MCH: 28.9 pg (ref 26.0–34.0)
MCHC: 31.9 g/dL — ABNORMAL LOW (ref 32.0–36.0)
MCV: 91 fL (ref 80–100)
Platelet: 316 10*3/uL (ref 150–440)
RBC: 4.3 10*6/uL (ref 3.80–5.20)
RDW: 15.4 % — ABNORMAL HIGH (ref 11.5–14.5)
WBC: 8.7 10*3/uL (ref 3.6–11.0)

## 2013-10-25 LAB — DIFFERENTIAL
Basophil #: 0.1 10*3/uL (ref 0.0–0.1)
Basophil %: 0.9 %
Eosinophil #: 0.1 10*3/uL (ref 0.0–0.7)
Eosinophil %: 0.7 %
Lymphocyte #: 1.4 10*3/uL (ref 1.0–3.6)
Lymphocyte %: 15.9 %
Monocyte #: 0.6 x10 3/mm (ref 0.2–0.9)
Monocyte %: 6.7 %
Neutrophil #: 6.7 10*3/uL — ABNORMAL HIGH (ref 1.4–6.5)
Neutrophil %: 75.8 %

## 2013-10-25 LAB — URINALYSIS, COMPLETE
Bacteria: NONE SEEN
Bilirubin,UR: NEGATIVE
Blood: NEGATIVE
Glucose,UR: NEGATIVE mg/dL (ref 0–75)
Leukocyte Esterase: NEGATIVE
Nitrite: NEGATIVE
Ph: 5 (ref 4.5–8.0)
Protein: NEGATIVE
RBC,UR: NONE SEEN /HPF (ref 0–5)
Specific Gravity: 1.018 (ref 1.003–1.030)
Squamous Epithelial: 1
WBC UR: NONE SEEN /HPF (ref 0–5)

## 2013-10-25 LAB — BASIC METABOLIC PANEL
Anion Gap: 8 (ref 7–16)
BUN: 6 mg/dL — ABNORMAL LOW (ref 7–18)
Calcium, Total: 8.8 mg/dL (ref 8.5–10.1)
Chloride: 106 mmol/L (ref 98–107)
Co2: 22 mmol/L (ref 21–32)
Creatinine: 0.5 mg/dL — ABNORMAL LOW (ref 0.60–1.30)
EGFR (African American): >60
EGFR (Non-African Amer.): >60
Glucose: 82 mg/dL (ref 65–99)
Osmolality: 269 (ref 275–301)
Potassium: 4 mmol/L (ref 3.5–5.1)
Sodium: 136 mmol/L (ref 136–145)

## 2013-10-25 LAB — GC/CHLAMYDIA PROBE AMP

## 2013-10-25 LAB — HCG, QUANTITATIVE, PREGNANCY: Beta Hcg, Quant.: 64313 m[IU]/mL — ABNORMAL HIGH

## 2013-10-25 LAB — WET PREP, GENITAL

## 2013-10-29 DIAGNOSIS — I429 Cardiomyopathy, unspecified: Secondary | ICD-10-CM | POA: Insufficient documentation

## 2013-10-29 DIAGNOSIS — R42 Dizziness and giddiness: Secondary | ICD-10-CM | POA: Insufficient documentation

## 2013-11-20 ENCOUNTER — Ambulatory Visit: Payer: Self-pay | Admitting: Oncology

## 2013-11-20 LAB — CBC CANCER CENTER
Basophil #: 0 x10 3/mm (ref 0.0–0.1)
Basophil %: 0.3 %
Eosinophil #: 0.1 x10 3/mm (ref 0.0–0.7)
Eosinophil %: 0.8 %
HCT: 37.4 % (ref 35.0–47.0)
HGB: 12.2 g/dL (ref 12.0–16.0)
Lymphocyte #: 1.1 x10 3/mm (ref 1.0–3.6)
Lymphocyte %: 10.2 %
MCH: 29.8 pg (ref 26.0–34.0)
MCHC: 32.6 g/dL (ref 32.0–36.0)
MCV: 91 fL (ref 80–100)
Monocyte #: 0.6 x10 3/mm (ref 0.2–0.9)
Monocyte %: 5.3 %
Neutrophil #: 8.6 x10 3/mm — ABNORMAL HIGH (ref 1.4–6.5)
Neutrophil %: 83.4 %
Platelet: 301 x10 3/mm (ref 150–440)
RBC: 4.09 10*6/uL (ref 3.80–5.20)
RDW: 14.4 % (ref 11.5–14.5)
WBC: 10.3 x10 3/mm (ref 3.6–11.0)

## 2013-11-20 LAB — IRON AND TIBC
Iron Bind.Cap.(Total): 439 ug/dL (ref 250–450)
Iron Saturation: 24 %
Iron: 107 ug/dL (ref 50–170)
Unbound Iron-Bind.Cap.: 332 ug/dL

## 2013-11-20 LAB — FERRITIN: Ferritin (ARMC): 29 ng/mL (ref 8–388)

## 2013-12-11 DIAGNOSIS — R002 Palpitations: Secondary | ICD-10-CM | POA: Insufficient documentation

## 2013-12-16 ENCOUNTER — Ambulatory Visit: Payer: Self-pay | Admitting: Oncology

## 2014-01-24 ENCOUNTER — Ambulatory Visit: Payer: Self-pay | Admitting: Oncology

## 2014-01-24 LAB — CBC CANCER CENTER
Basophil #: 0 x10 3/mm (ref 0.0–0.1)
Basophil %: 0.3 %
Eosinophil #: 0 x10 3/mm (ref 0.0–0.7)
Eosinophil %: 0.5 %
HCT: 37.9 % (ref 35.0–47.0)
HGB: 12.4 g/dL (ref 12.0–16.0)
Lymphocyte #: 1.2 x10 3/mm (ref 1.0–3.6)
Lymphocyte %: 14.2 %
MCH: 30.2 pg (ref 26.0–34.0)
MCHC: 32.7 g/dL (ref 32.0–36.0)
MCV: 92 fL (ref 80–100)
Monocyte #: 0.5 x10 3/mm (ref 0.2–0.9)
Monocyte %: 6.2 %
Neutrophil #: 6.4 x10 3/mm (ref 1.4–6.5)
Neutrophil %: 78.8 %
Platelet: 338 x10 3/mm (ref 150–440)
RBC: 4.1 10*6/uL (ref 3.80–5.20)
RDW: 13.2 % (ref 11.5–14.5)
WBC: 8.1 x10 3/mm (ref 3.6–11.0)

## 2014-01-24 LAB — IRON AND TIBC
Iron Bind.Cap.(Total): 599 ug/dL — ABNORMAL HIGH (ref 250–450)
Iron Saturation: 15 %
Iron: 88 ug/dL (ref 50–170)
Unbound Iron-Bind.Cap.: 511 ug/dL

## 2014-01-24 LAB — FERRITIN: Ferritin (ARMC): 7 ng/mL — ABNORMAL LOW (ref 8–388)

## 2014-02-04 DIAGNOSIS — R0681 Apnea, not elsewhere classified: Secondary | ICD-10-CM | POA: Insufficient documentation

## 2014-02-04 DIAGNOSIS — R55 Syncope and collapse: Secondary | ICD-10-CM | POA: Insufficient documentation

## 2014-02-05 ENCOUNTER — Observation Stay: Payer: Self-pay | Admitting: Obstetrics & Gynecology

## 2014-02-05 LAB — COMPREHENSIVE METABOLIC PANEL
Albumin: 2.6 g/dL — ABNORMAL LOW (ref 3.4–5.0)
Alkaline Phosphatase: 35 U/L — ABNORMAL LOW
Anion Gap: 10 (ref 7–16)
BUN: 7 mg/dL (ref 7–18)
Bilirubin,Total: 0.1 mg/dL — ABNORMAL LOW (ref 0.2–1.0)
Calcium, Total: 8 mg/dL — ABNORMAL LOW (ref 8.5–10.1)
Chloride: 110 mmol/L — ABNORMAL HIGH (ref 98–107)
Co2: 20 mmol/L — ABNORMAL LOW (ref 21–32)
Creatinine: 0.42 mg/dL — ABNORMAL LOW (ref 0.60–1.30)
EGFR (African American): >60
EGFR (Non-African Amer.): >60
Glucose: 85 mg/dL (ref 65–99)
Osmolality: 277 (ref 275–301)
Potassium: 3.5 mmol/L (ref 3.5–5.1)
SGOT(AST): 17 U/L (ref 15–37)
SGPT (ALT): 14 U/L
Sodium: 140 mmol/L (ref 136–145)
Total Protein: 6.6 g/dL (ref 6.4–8.2)

## 2014-02-05 LAB — CBC WITH DIFFERENTIAL/PLATELET
Basophil #: 0 10*3/uL (ref 0.0–0.1)
Basophil %: 0.3 %
Eosinophil #: 0 10*3/uL (ref 0.0–0.7)
Eosinophil %: 0.1 %
HCT: 35.7 % (ref 35.0–47.0)
HGB: 11.4 g/dL — ABNORMAL LOW (ref 12.0–16.0)
Lymphocyte #: 0.8 10*3/uL — ABNORMAL LOW (ref 1.0–3.6)
Lymphocyte %: 6.9 %
MCH: 30.6 pg (ref 26.0–34.0)
MCHC: 31.9 g/dL — ABNORMAL LOW (ref 32.0–36.0)
MCV: 96 fL (ref 80–100)
Monocyte #: 0.4 x10 3/mm (ref 0.2–0.9)
Monocyte %: 3.4 %
Neutrophil #: 10.3 10*3/uL — ABNORMAL HIGH (ref 1.4–6.5)
Neutrophil %: 89.3 %
Platelet: 284 10*3/uL (ref 150–440)
RBC: 3.72 10*6/uL — ABNORMAL LOW (ref 3.80–5.20)
RDW: 13.2 % (ref 11.5–14.5)
WBC: 11.5 10*3/uL — ABNORMAL HIGH (ref 3.6–11.0)

## 2014-02-05 LAB — CLOSTRIDIUM DIFFICILE(ARMC)

## 2014-02-06 LAB — LIPASE, BLOOD: Lipase: 152 U/L (ref 73–393)

## 2014-02-07 LAB — STOOL CULTURE

## 2014-02-15 ENCOUNTER — Ambulatory Visit: Payer: Self-pay | Admitting: Oncology

## 2014-02-25 ENCOUNTER — Inpatient Hospital Stay: Payer: Self-pay | Admitting: Obstetrics and Gynecology

## 2014-02-25 LAB — URINALYSIS, COMPLETE
Bacteria: NONE SEEN
Bilirubin,UR: NEGATIVE
Blood: NEGATIVE
Glucose,UR: NEGATIVE mg/dL (ref 0–75)
Leukocyte Esterase: NEGATIVE
Nitrite: NEGATIVE
Ph: 6 (ref 4.5–8.0)
Protein: NEGATIVE
RBC,UR: 2 /HPF (ref 0–5)
Specific Gravity: 1.014 (ref 1.003–1.030)
Squamous Epithelial: 1
WBC UR: 1 /HPF (ref 0–5)

## 2014-02-25 LAB — COMPREHENSIVE METABOLIC PANEL
Albumin: 2.6 g/dL — ABNORMAL LOW (ref 3.4–5.0)
Alkaline Phosphatase: 41 U/L — ABNORMAL LOW
Anion Gap: 8 (ref 7–16)
BUN: 5 mg/dL — ABNORMAL LOW (ref 7–18)
Bilirubin,Total: 0.3 mg/dL (ref 0.2–1.0)
Calcium, Total: 8.4 mg/dL — ABNORMAL LOW (ref 8.5–10.1)
Chloride: 106 mmol/L (ref 98–107)
Co2: 24 mmol/L (ref 21–32)
Creatinine: 0.52 mg/dL — ABNORMAL LOW (ref 0.60–1.30)
EGFR (African American): >60
EGFR (Non-African Amer.): >60
Glucose: 147 mg/dL — ABNORMAL HIGH (ref 65–99)
Osmolality: 276 (ref 275–301)
Potassium: 4 mmol/L (ref 3.5–5.1)
SGOT(AST): 17 U/L (ref 15–37)
SGPT (ALT): 10 U/L — ABNORMAL LOW
Sodium: 138 mmol/L (ref 136–145)
Total Protein: 6.2 g/dL — ABNORMAL LOW (ref 6.4–8.2)

## 2014-02-25 LAB — CBC WITH DIFFERENTIAL/PLATELET
Basophil #: 0 10*3/uL (ref 0.0–0.1)
Basophil %: 0.4 %
Eosinophil #: 0 10*3/uL (ref 0.0–0.7)
Eosinophil %: 0.5 %
HCT: 37 % (ref 35.0–47.0)
HGB: 11.9 g/dL — ABNORMAL LOW (ref 12.0–16.0)
Lymphocyte #: 1 10*3/uL (ref 1.0–3.6)
Lymphocyte %: 14.3 %
MCH: 31 pg (ref 26.0–34.0)
MCHC: 32.2 g/dL (ref 32.0–36.0)
MCV: 96 fL (ref 80–100)
Monocyte #: 0.2 x10 3/mm (ref 0.2–0.9)
Monocyte %: 3.3 %
Neutrophil #: 5.6 10*3/uL (ref 1.4–6.5)
Neutrophil %: 81.5 %
Platelet: 262 10*3/uL (ref 150–440)
RBC: 3.83 10*6/uL (ref 3.80–5.20)
RDW: 13.3 % (ref 11.5–14.5)
WBC: 6.8 10*3/uL (ref 3.6–11.0)

## 2014-02-25 LAB — PROTEIN / CREATININE RATIO, URINE
Creatinine, Urine: 75.4 mg/dL (ref 30.0–125.0)
Protein, Random Urine: 14 mg/dL — ABNORMAL HIGH (ref 0–12)
Protein/Creat. Ratio: 186 mg/gCREAT (ref 0–200)

## 2014-02-25 LAB — HEMOGLOBIN A1C: Hemoglobin A1C: 4.8 % (ref 4.2–6.3)

## 2014-03-01 ENCOUNTER — Ambulatory Visit: Payer: Self-pay | Admitting: Obstetrics & Gynecology

## 2014-03-07 ENCOUNTER — Observation Stay: Payer: Self-pay

## 2014-03-18 ENCOUNTER — Ambulatory Visit: Payer: Self-pay | Admitting: Obstetrics & Gynecology

## 2014-03-22 ENCOUNTER — Observation Stay: Payer: Self-pay | Admitting: Obstetrics and Gynecology

## 2014-03-22 LAB — BASIC METABOLIC PANEL
Anion Gap: 8 (ref 7–16)
BUN: 10 mg/dL (ref 7–18)
Calcium, Total: 8 mg/dL — ABNORMAL LOW (ref 8.5–10.1)
Chloride: 109 mmol/L — ABNORMAL HIGH (ref 98–107)
Co2: 20 mmol/L — ABNORMAL LOW (ref 21–32)
Creatinine: 0.78 mg/dL (ref 0.60–1.30)
EGFR (African American): >60
EGFR (Non-African Amer.): >60
Glucose: 138 mg/dL — ABNORMAL HIGH (ref 65–99)
Osmolality: 275 (ref 275–301)
Potassium: 3.5 mmol/L (ref 3.5–5.1)
Sodium: 137 mmol/L (ref 136–145)

## 2014-03-22 LAB — URINALYSIS, COMPLETE
Bacteria: NONE SEEN
Bilirubin,UR: NEGATIVE
Blood: NEGATIVE
Glucose,UR: >=500 mg/dL (ref 0–75)
Leukocyte Esterase: NEGATIVE
Nitrite: NEGATIVE
Ph: 5 (ref 4.5–8.0)
Protein: NEGATIVE
RBC,UR: 2 /HPF (ref 0–5)
Specific Gravity: 1.028 (ref 1.003–1.030)
Squamous Epithelial: 1
WBC UR: 3 /HPF (ref 0–5)

## 2014-03-23 LAB — CBC WITH DIFFERENTIAL/PLATELET
Basophil #: 0 10*3/uL (ref 0.0–0.1)
Basophil %: 0.3 %
Eosinophil #: 0.1 10*3/uL (ref 0.0–0.7)
Eosinophil %: 0.8 %
HCT: 34.4 % — ABNORMAL LOW (ref 35.0–47.0)
HGB: 11.2 g/dL — ABNORMAL LOW (ref 12.0–16.0)
Lymphocyte #: 1.4 10*3/uL (ref 1.0–3.6)
Lymphocyte %: 20.5 %
MCH: 30.8 pg (ref 26.0–34.0)
MCHC: 32.6 g/dL (ref 32.0–36.0)
MCV: 94 fL (ref 80–100)
Monocyte #: 0.5 x10 3/mm (ref 0.2–0.9)
Monocyte %: 6.7 %
Neutrophil #: 4.9 10*3/uL (ref 1.4–6.5)
Neutrophil %: 71.7 %
Platelet: 232 10*3/uL (ref 150–440)
RBC: 3.65 10*6/uL — ABNORMAL LOW (ref 3.80–5.20)
RDW: 13.1 % (ref 11.5–14.5)
WBC: 6.8 10*3/uL (ref 3.6–11.0)

## 2014-03-23 LAB — HEMOGLOBIN A1C: Hemoglobin A1C: 4.8 % (ref 4.2–6.3)

## 2014-03-28 ENCOUNTER — Ambulatory Visit: Payer: Self-pay | Admitting: Oncology

## 2014-03-28 ENCOUNTER — Encounter: Payer: Self-pay | Admitting: Maternal & Fetal Medicine

## 2014-04-01 DIAGNOSIS — Z9889 Other specified postprocedural states: Secondary | ICD-10-CM | POA: Insufficient documentation

## 2014-04-01 DIAGNOSIS — O24419 Gestational diabetes mellitus in pregnancy, unspecified control: Secondary | ICD-10-CM | POA: Insufficient documentation

## 2014-04-12 DIAGNOSIS — M5137 Other intervertebral disc degeneration, lumbosacral region: Secondary | ICD-10-CM | POA: Insufficient documentation

## 2014-04-12 DIAGNOSIS — M545 Low back pain, unspecified: Secondary | ICD-10-CM | POA: Insufficient documentation

## 2014-04-12 DIAGNOSIS — R1011 Right upper quadrant pain: Secondary | ICD-10-CM | POA: Insufficient documentation

## 2014-04-12 DIAGNOSIS — G8929 Other chronic pain: Secondary | ICD-10-CM | POA: Insufficient documentation

## 2014-04-12 DIAGNOSIS — M51379 Other intervertebral disc degeneration, lumbosacral region without mention of lumbar back pain or lower extremity pain: Secondary | ICD-10-CM | POA: Insufficient documentation

## 2014-04-12 DIAGNOSIS — M7660 Achilles tendinitis, unspecified leg: Secondary | ICD-10-CM | POA: Insufficient documentation

## 2014-04-16 ENCOUNTER — Ambulatory Visit
Admit: 2014-04-16 | Disposition: A | Discharge status: Home / Self Care | Payer: Self-pay | Attending: Oncology | Admitting: Oncology

## 2014-04-22 DIAGNOSIS — E559 Vitamin D deficiency, unspecified: Secondary | ICD-10-CM | POA: Insufficient documentation

## 2014-06-06 DIAGNOSIS — Z3009 Encounter for other general counseling and advice on contraception: Secondary | ICD-10-CM | POA: Insufficient documentation

## 2014-06-06 DIAGNOSIS — Z9884 Bariatric surgery status: Secondary | ICD-10-CM | POA: Insufficient documentation

## 2014-06-06 DIAGNOSIS — D219 Benign neoplasm of connective and other soft tissue, unspecified: Secondary | ICD-10-CM | POA: Insufficient documentation

## 2014-06-07 NOTE — Consult Note (Signed)
PATIENT NAME:  [name], WACK MR#:  [id number] DATE OF BIRTH:  [birth year]  DATE OF CONSULTATION:  02/24/2012  REFERRING PHYSICIAN:   CONSULTING PHYSICIAN:  Jill Side, MD  REASON FOR CONSULTATION:  Abdominal pain.   HISTORY OF PRESENT ILLNESS:  The patient is a 39 year old female with complicated past medical history. The patient was admitted with right upper quadrant abdominal pain. Apparently, the patient has been having a lot of trouble with her abdomen mostly abdominal discomfort and indigestion in the upper abdomen and right upper quadrant area. She apparently had an EGD about a month ago and was started on stool softeners and sucralfate after that. According to the patient, the EGD was unremarkable, although we do not have that report available to [place]. A couple of days ago, the patient developed more acute and more severe right upper quadrant abdominal pain as well as nausea and diarrhea. The pain was mostly located in the right upper quadrant area with radiation into the ribs and epigastric region. According to her, she had some subjective fever and chills. The patient came to the Emergency Room, received IV pain medication and was subsequently admitted to the hospital. The patient was evaluated yesterday complaining of significant right upper quadrant pain. Her diarrhea has resolved within 24 hours, but she was nauseated and spitting when seen yesterday evening. As of today, she feels better. There has been no nausea or vomiting. There have been no bowel movements since admission and her abdominal pain is better.   The patient's past medical history is rather complicated including history of gastric bypass surgery with history of questionable small-bowel obstruction. Apparently, the patient required exploratory laparotomy to relieve the small-bowel obstruction. The patient also reports history of GI bleed which according to her resulted in her being coded and she spent about 3 weeks in an  Intensive Care Unit. Her past medical history is also significant for anxiety and depression.   REVIEW OF SYSTEMS:  Grossly negative except for what is mentioned in the history of present illness.   PAST SURGICAL HISTORY:  Cholecystectomy, gastric bypass surgery, lysis of adhesions and laparotomy as mentioned above.   MEDICATIONS AT HOME:  Trazodone, clonazepam, hydrocodone, Celebrex, sucralfate.   ALLERGIES:  PENICILLIN AND SULFA.   SOCIAL HISTORY:  She does not smoke or drink or use drugs.   FAMILY HISTORY:  Unremarkable.   PHYSICAL EXAMINATION: GENERAL: Well-built female, does not appear to be in any acute distress. She clinically does not appear to be severely anemic or jaundiced.  HEENT: Grossly unremarkable.  NECK: Veins are flat.  LUNGS: Clear to auscultation bilaterally with fair air entry and no added sounds.  CARDIOVASCULAR: Regular rate and rhythm. No gallops or murmur.  ABDOMEN: Examination done yesterday showed significant right upper quadrant tenderness. No rebound. Abdomen is otherwise soft and benign. Bowel sounds positive today.  NEUROLOGIC: Examination appears to be grossly unremarkable.   DIAGNOSTIC DATA:  On admission, the patient's liver enzymes were normal. Hemoglobin was 12.5, white cell count of 9, hematocrit of 38, MCV 80. Today, her hemoglobin is down to 10.8. Her liver enzymes have gone up slightly as of today with an AST of 217, ALT 433, albumin is somewhat low at 3.1, other liver enzymes are normal. Ultrasound of the abdomen showed a normal liver, normal pancreas and no ductal dilatation. CT scan of the abdomen and pelvis was also quite unremarkable; all was done without oral contrast.   ASSESSMENT AND PLAN:  The patient with abdominal pain, nausea, vomiting  and diarrhea. The whole episode appears to be an episode of gastroenteritis, most likely viral. The patient does have chronic abdominal pain, the etiology for which is not clear, although a functional bowel  disorder is highly likely due to her history of anxiety and depression. A recent upper GI endoscopy was unremarkable, although we do not have that report. The CT showed slight thickening of the wall of the stomach consistent with gastritis or poor gastric distention. The rest of the studies are fairly unremarkable. The slight bump in her liver enzymes may be secondary to viral gastroenteritis versus use of drugs in the hospital. The patient clinically seems to be doing better. I will continue to follow the liver enzymes. We will continue to try to obtain the report of her upper GI endoscopy and advance diet to full liquid. We will follow and further recommendations to follow.    ____________________________ Jill Side, MD si:si D: 02/24/2012 16:47:55 ET T: 02/24/2012 18:07:01 ET JOB#: [id number]  cc: Jill Side, MD, <Dictator> Jill Side MD ELECTRONICALLY SIGNED 02/25/2012 8:47

## 2014-06-07 NOTE — Consult Note (Signed)
Brief Consult Note: Diagnosis: Abdominal pain.   Patient was seen by consultant.   Comments: RUQ abdominal pain with diarrhea. The diarrhea has resolved. Abdominal examination is benign. CBC. LFT, lipase. [place] and CT unremarkable.  Impression: ? Gastroenteritis. No evidence of any other significant intra abdominal pathology and recent EGD was unremarkable. CT suggests mild thickning of gastric wall which may be related to prior surgery or under distension.  Recommendations: Will obtain recent EGD report. Continue Protonix and Carafate. Further recommendations to follow.  Electronic Signatures: Jill Side (MD)  (Signed 08-Jan-14 17:04)  Authored: Brief Consult Note   Last Updated: 08-Jan-14 17:04 by Jill Side (MD)

## 2014-06-07 NOTE — Discharge Summary (Signed)
PATIENT NAME:  [name], [name] MR#:  [id number] DATE OF BIRTH:  [birth year]  DATE OF ADMISSION:  02/25/2012 DATE OF DISCHARGE:  02/26/2012  CONSULTANT: Jill Side, MD - Gastroenterology.   PRIMARY CARE PHYSICIAN: Claris Gower, MD.  PRIMARY GASTROENTEROLOGIST: [facility].  CHIEF COMPLAINT: Abdominal pain.   DISCHARGE DIAGNOSES: 1. Transaminitis likely from viral gastroenteritis.  2. Nausea, vomiting and abdominal pain likely from gastroenteritis.  3. History of anxiety. 4. History of depression.  5. History of complex gastrointestinal history. 6. Vitamin D deficiency.  DISCHARGE MEDICATIONS: 1. Sucralfate 1 gram one tab 2 times a day as needed. 2. Trazodone 100 mg once a day at bedtime. 3. Prazosin 2 mg one cap 2 times a day. 4. Fluoxetine 60 mg delayed-release capsule 2 caps once a day. 5. Clonazepam 1 mg 3 times a day.  6. Dicyclomine 10 mg one cap every 8 hours. 7. Protonix 40 mg once a day. 8. Zofran ODT 4 mg one tab 3 times a day as needed for nausea and vomiting. 9. Oxycodone 5 mg 1 tab every 6 hours as needed for pain.   NOTE: Please stop taking Percocet and Celebrex.   DIET: Low sodium, GI soft diet.   ACTIVITY: As tolerated.   DISCHARGE FOLLOWUP: Please follow with your primary care physician within 1 to 2 weeks. Please follow with your primary gastroenterologist within 1 to 2 weeks for LFT check and monitoring.   DISPOSITION: Home.   HISTORY OF PRESENT ILLNESS/HOSPITAL COURSE: For full details of the H and P, please see the dictation on 02/22/2011 by Dr. Claria Dice, but briefly this is an obese 39 year old Caucasian female with history of recent EGD per her GI physician who came in with nausea, vomiting and abdominal pain which was in the epigastric area, under the ribs as well, with subjective fevers and chills. The patient required IV Dilaudid and Zofran in the ER and therefore admitted to the hospitalist service. CT of the abdomen and pelvis  showed gastritis. She reports extensive GI history status post gastric bypass with small bowel obstruction needing surgery. The patient also reported history of GI bleeding in the past requiring being coded and spending time in the ICU for several weeks. Here initial LFTs were within normal limits, but did then AST jumped to 270 and ALT jumped to 433 the subsequent day. The bilirubin was fine as well as alkaline phosphatase. Abdominal ultrasound was done on the day of admission as well which showed gallbladder being absent, liver and pancreas being normal. Gastroenterology was consulted. She was started on clears and subsequently slowly uptitrated to GI soft. Per gastroenterology, this was likely viral gastroenteritis and elevation of LFTs could be seen with that. She did require opiates and Zofran. The LFTs did trend down and at this point she feels symptomatically better and therefore will be discharged with outpatient follow-up. She was also started on some IV fluids. Her anxiety and depression medications were continued and her Percocet was discontinued given the Tylenol content in Percocet.   TOTAL TIME SPENT: 35 minutes.  ____________________________ Vivien Presto, MD sa:sb D: 02/27/2012 15:33:00 ET T: 02/28/2012 08:47:46 ET JOB#: [id number]  cc: Vivien Presto, MD, <Dictator> Zigmund Gottron. Arelia Sneddon, MD Vivien Presto MD ELECTRONICALLY SIGNED 03/06/2012 20:32

## 2014-06-07 NOTE — Consult Note (Signed)
Chief Complaint:   Subjective/Chief Complaint No vomiting or diarrhea. Appears unhappy and c/o generalized abdominal pain. Started her periods today and she usually gets severe cramps with it. Asking for a heating pad. Would like diet to be advanced.   VITAL SIGNS/ANCILLARY NOTES: **Vital Signs.:   10-Jan-14 04:00   Vital Signs Type Routine   Temperature Temperature (F) 98.1   Celsius 36.7   Temperature Source Oral   Pulse Pulse 63   Respirations Respirations 20   Systolic BP Systolic BP 97   Diastolic BP (mmHg) Diastolic BP (mmHg) 66   Mean BP 76   Pulse Ox % Pulse Ox % 95   Pulse Ox Activity Level  At rest   Oxygen Delivery Room Air/ 21 %   Brief Assessment:   Additional Physical Exam Abdomen is soft. Non tender. Non distended. No rebound or guarding.   Lab Results: Hepatic:  10-Jan-14 04:42    Bilirubin, Total 0.3   Bilirubin, Direct 0.1 (Result(s) reported on 25 Feb 2012 at 06:27AM.)   Alkaline Phosphatase 87   SGPT (ALT)  271   SGOT (AST)  80   Total Protein, Serum  6.1   Albumin, Serum  3.0   Assessment/Plan:  Assessment/Plan:   Assessment Probable viral gastroenteritis, clinically resolving. Abdominal examination is normal. Some of her symptoms are probably related to her mensturation as well as underlying depression/ anxiety and prior experiences.  Transiet increase in LFT,s. LFT's are much better. No evidence of biliary pathology.    Plan Will advance to soft diet. Will add Bentyl. Probable DC in 24-48 hours. Will sign off. Please call on call GI if needed.   Electronic Signatures: Jill Side (MD)  (Signed 10-Jan-14 08:35)  Authored: Chief Complaint, VITAL SIGNS/ANCILLARY NOTES, Brief Assessment, Lab Results, Assessment/Plan   Last Updated: 10-Jan-14 08:35 by Jill Side (MD)

## 2014-06-07 NOTE — H&P (Signed)
PATIENT NAME:  [name], [name] MR#:  [id number] DATE OF BIRTH:  [birth year]  DATE OF ADMISSION:  02/22/2012  PRIMARY CARE PHYSICIAN:  Dr. Claris Gower.   CODE STATUS:  FULL CODE.    GASTROENTEROLOGIST:  [facility].  CHIEF COMPLAINT:  Abdominal pain.   HISTORY OF PRESENT ILLNESS:  This is a 39 year old female who has had a low-grade chronic right upper quadrant abdominal pain for several months.  She did go see her gastroenterologist who did an EGD with biopsy and started stool softeners and sucralfate several weeks ago.  Per patient nothing was found on the EGD despite the fact that sucralfate was started.  Last night she developed some nausea, vomiting and diarrhea.  No evidence of bleeding.  She states the pain did become a bit more severe, but was still tolerable, but after lunch today her pain became quite significant, rating 10/10.  It was sharp.  It was continuous.  It radiated slightly to the epigastric area.  The pain was also under the ribs.  She states she had some subjective fever and chills, some mild shortness of breath, but no wheezing, no cough, no burning on urination.  She finally came to the ER.  In the ER she has had no further diarrhea, however she has continued to be nauseous and retching.  She has received at least 5 mg of IV Dilaudid with some improvement, but no resolution of her pain and she continued to be nauseous despite Zofran.  The hospitalist was called and requested to bring in for 23 hour observation.   This patient does have a complicated past medical history which includes gastric bypass where  she had subsequent small bowel obstruction.  She was recently admitted at [facility] with abdominal pain, she was admitted for two weeks and with negative CAT scans.  She was eventually transferred to [facility] where she had exploratory laproscopic surgery whic revealed she had a twisted bowel.  The patient states she had no gangrene, however she had to have lysis of  adhesions.  She also reports a GI bleed which resulted in her getting coded and being in the intensive care unit for three weeks.  With this past medical history patient is very nervous about being discharged home and as stated observation status has been requested.  History provided by the patient who is alert and oriented.  Family members are at the bedside.   REVIEW OF SYSTEMS:  All 10 point systems reviewed and negative except as noted in history of present illness.   PAST MEDICAL HISTORY:  Anxiety and depression, complex GI history as noted above, vitamin D deficiency.   PAST SURGICAL HISTORY:  Cholecystectomy, gastric bypass and lysis of adhesions.    MEDICATIONS:  Trazodone 100 mg by mouth at bedtime, prazosin 2 mg by mouth twice daily, duloxetine 60 mg 2 capsules q.a.m., clonazepam 1 mg by mouth three times daily, hydrocodone 7.5 q. 4 hours as needed, Celebrex 200 mg q. 12 hours, sucralfate 1 mg twice daily as needed.   ALLERGIES:   PENICILLIN AND SULFA.   SOCIAL HISTORY:  Negative for tobacco, alcohol or illicit drugs.   FAMILY HISTORY:  Significant for rheumatoid arthritis and cancer.   PHYSICAL EXAMINATION: VITAL SIGNS:  Blood pressure has ranged from 90 to 139/60, the range to 81, pulse 66, respirations 16, temperature 97.6.  GENERAL:  Alert and oriented female, does appear to be in some discomfort.  EYES:  Pink conjunctivae.  PERRLA.  No scleral icterus.  EARS, NOSE, THROAT:  Moist oral mucosa.  Trachea midline.  NECK:  Supple.  No thyromegaly.  LUNGS:  Clear.  No wheeze.  No use of accessory muscles.  CARDIOVASCULAR:  Regular rate and rhythm without murmurs, regurgitation or gallops.  No JVD.  ABDOMEN:  Soft, positive bowel sounds.  Positive tenderness to palpation, greatest in the right upper quadrant, not an acute surgical abdomen.  No organomegaly appreciated.  NEUROLOGIC:  Cranial nerves II-XII grossly intact.  Sensation intact.  MUSCULOSKELETAL:  Strength 5/5 in all  extremities.  No clubbing, cyanosis or edema.  SKIN:  No rashes.  No subcutaneous crepitation.  PSYCHIATRIC:  Alert, oriented patient.   LABORATORY DATA:  White blood count 9.0, hemoglobin 12.5, platelets 373.  Sodium 139, potassium 3.9, chloride 107, CO2 24, BUN 8, creatinine 0.69, glucose 89, lipase 145.  LFTs are all normal.  Urinalysis is negative with no nitrites, 7 white blood cells, leukocyte esterase 1+.  Pregnancy test is negative.  CT abdomen and pelvis shows possible gastritis of the distal stomach.  This portion of the stomach which is the bypass portion is incompletely distended and not well evaluated.   ASSESSMENT AND PLAN: 1.  Gastroenteritis.  2.  Abdominal pain.  Patient will be brought in for 23 hour observation.  I will keep her on a clear liquid diet for now, some IV fluid hydration, antiemetics and pain medications will be ordered.  A repeat KUB will be ordered for the a.m. as well as pain medications and antiemetics.  3.  Anxiety and depression.   4.  Vitamin D deficiency.  5.  Insomnia.  Home medications will be resumed.  6.  Deep vein thrombosis and gastrointestinal prophylaxis have both been ordered.     ____________________________ Quintella Baton, MD dc:ea D: 02/23/2012 02:47:00 ET T: 02/23/2012 03:55:58 ET JOB#: [id number]  cc: Quintella Baton, MD, <Dictator> Quintella Baton MD ELECTRONICALLY SIGNED 04/07/2012 14:07

## 2014-06-08 NOTE — Consult Note (Signed)
PATIENT NAME:  [name], BOURDIER MR#:  [id number] DATE OF BIRTH:  [birth year]  DATE OF CONSULTATION:  07/04/2013  REFERRING PHYSICIAN:  Doug Sou, MD CONSULTING PHYSICIAN:  Duncan Alejandro R. Mubarak Bevens, MD  REASON FOR CONSULTATION: Cough and shortness of breath.   HISTORY OF PRESENTING ILLNESS: This is a 39 year old Caucasian female patient who had hysteroscopy, operative laparoscopy, polypectomy, and peritoneal biopsy on 06/21/2013, returned to the hospital with some shortness of breath, feeling weak. Was treated for a UTI and later diagnosed with a left lower lobe pneumonia when she had chest pain and shortness of breath. CT was done which showed no pulmonary embolism. The patient finished her Levaquin 2 days prior, also finished prednisone which was given only for 3 days. Has used albuterol inhaler which also ran out 3 days back. Today in the Emergency Room, the patient comes in with the same pain, shortness of breath, dry cough, and generalized weakness. Here, the patient is afebrile with normal white count, no tachycardia, no tachypnea, not needing any oxygen. Chest x-ray does show the left lower lobe persistent infiltrate. Hospitalist team has been consulted for input regarding this case.   The patient does not have any PND, orthopnea, lower extremity edema. No sick contacts. Her pain is pleuritic and same as when she had the diagnosis of pneumonia on the CT and pulmonary embolism ruled out.   She is afebrile. No chills.   PAST MEDICAL HISTORY: 1.  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  2.  Gastric bypass.  3.  Vitamin D deficiency.  4.  Depression.  5.  Anxiety.  6.  Cholecystectomy.  7.  Iron deficiency anemia.   FAMILY HISTORY: Breast cancer, colon cancer, lung cancer, melanoma, anemia, DVT, heart disease, and hypertension.   SOCIAL HISTORY: The patient does not smoke. No alcohol. Lives with her children.   HOME MEDICATIONS:  1.  Acetaminophen/oxycodone 325/5, 1 tablet every 6  hours as needed.  2.  Alprazolam 1 mg oral once a day at bedtime as needed.  3.  Clonazepam 1 mg oral 3 times a day.  4.  Duloxetine 60 mg 2 capsules oral once a day. 5.  Folic acid 1 mg 2 times a day.  6.  Ibuprofen 800 mg oral 3 times a day.  7.  Levaquin 750 mg orally q.24 hours, finished on 07/03/2013.  8.  Omeprazole 40 mg daily.  9.  Prazosin 2 mg oral 2 times a day.  10.  Progesterone 1 capsule oral once a day.  11.  Sucralfate 1 gram oral 2 times a day.  12.  Trazodone 100 mg oral once a day.  13.  Vitamin B12 injectable solution once a week, 1000 mg.   REVIEW OF SYSTEMS:  CONSTITUTIONAL: Complains of fatigue, weakness.  EYES: No blurred vision, pain, redness. ENT: No tinnitus, ear pain, hearing loss.  RESPIRATORY: Has cough, wheezing, chest pain.  CARDIOVASCULAR: Has the chest pain. No orthopnea, edema.  GASTROINTESTINAL: No nausea, vomiting, diarrhea, abdominal pain.  GENITOURINARY: Has some problems with urination since her surgery.  MUSCULOSKELETAL: No joint swelling, redness, effusion of large joints. Normal muscle tone.  NEUROLOGIC: No focal numbness, weakness, seizure.  PSYCHIATRIC: No anxiety or depression.   ALLERGIES: AMOXICILLIN, MORPHINE, PENICILLIN, SULFA.   PHYSICAL EXAMINATION: VITAL SIGNS: Temperature 97.8, pulse 84, blood pressure of 116/84, saturating 100% on room air.  GENERAL: Moderately built Caucasian female patient sitting up in bed, seems comfortable, conversational, cooperative with exam. Has no conversational dyspnea.  PSYCHIATRIC: Alert and oriented x  3. Is anxious. Judgment intact.  HEENT: Atraumatic, normocephalic. Oral mucosa moist and pink. External ears and nose normal. No pallor. No icterus. Pupils bilaterally equal and reactive to light.  NECK: Supple. No thyromegaly or palpable lymph nodes. Trachea midline. No carotid bruits, JVD.  CARDIOVASCULAR: S1, S2, without any murmurs. Peripheral pulses 2+. No edema.  RESPIRATORY: Has normal work of  breathing. Has bilateral wheezing but good air entry.  GASTROINTESTINAL: Soft abdomen, nontender. Bowel sounds present. No hepatosplenomegaly palpable.  GENITOURINARY: No CVA tenderness.  SKIN: Warm and dry. No petechiae, rash, ulcers. Recent scars from surgery are healing well.   MUSCULOSKELETAL: No joint swelling or redness in large joints.  NEUROLOGICAL: Motor strength 5/5 in upper and lower extremities.  LYMPHATIC: No cervical lymphadenopathy.   LABORATORY AND RADIOLOGICAL DATA: Show glucose of 85, BUN 8, creatinine 0.64, sodium 140, potassium 3.2. WBC 9.8, hemoglobin 11.7, platelets of 381. Urinalysis shows no bacteria.   Chest x-ray shows left lower lobe infiltrate, minimal.   ASSESSMENT AND PLAN: 1.  Acute postinfectious bronchitis, has been recently treated for pneumonia. The patient does have infiltrate on chest x-ray, but this can be persistent for up to 6 weeks on radiological examination. The patient is afebrile, normal white count, not tachypneic, no tachycardia, not needing any oxygen. The patient feels better after the breathing treatment. Will give her another round of DuoNeb nebulizer. The patient can be discharged home on antibiotics. Have printed out prescriptions for azithromycin, prednisone, and ProAir inhaler. The patient will follow up with her primary care physician. I have explained to her that she will notice some wheezing and cough for another a week or even 2, but should slowly get better. If she does have worsening symptoms, she needs to return to the Emergency Room. The case has been discussed with Dr. Ethelda Chick of Emergency Room, who will be discharging the patient home.  2.  Chronic pain: Continue medications.  3.  Elevated blood pressure, just one reading: This is secondary to her anxiety and pain.  3.  Hypokalemia: Has been replaced.  4.  Iron deficiency anemia: The patient sees Dr. Orlie Dakin for her iron infusions.   TIME SPENT TODAY ON THIS CONSULT: 45  minutes.  ____________________________ Molinda Bailiff Khamya Topp, MD srs:jcm D: 07/04/2013 15:43:15 ET T: 07/04/2013 16:20:01 ET JOB#: [id number]  cc: Wardell Heath R. Fantasia Jinkins, MD, <Dictator> Tollie Pizza. Orlie Dakin, MD Orie Fisherman MD ELECTRONICALLY SIGNED 07/13/2013 14:15

## 2014-06-08 NOTE — Op Note (Signed)
PATIENT NAME:  [name], [name] MR#:  [id number] DATE OF BIRTH:  January 09, 1976  DATE OF PROCEDURE:  06/21/2013  PREOPERATIVE DIAGNOSIS: Pelvic pain, ovarian mass, polyp in the uterus.  POSTOPERATIVE DIAGNOSIS: Pelvic pain, ovarian mass, polyp in the uterus.  PROCEDURE: Bilateral cystectomy, removal of the peritoneum behind the uterus, hysteroscopy, dilatation and curettage, polypectomy.   ESTIMATED BLOOD LOSS: 50 mL.  SURGEON: Clayburn Pert, MD  DESCRIPTION OF PROCEDURE: The patient was taken to the operating room and placed in the supine position. After adequate general endotracheal anesthesia was instilled, the patient was prepped and draped in the usual fashion. A side-opening speculum was placed in the patient's vagina and the anterior lip of the cervix was grasped with a single-tooth tenaculum. The umbilicus was injected with Marcaine. Bladder was drained. Incision was cut at the umbilicus. A Veress needle was placed in the uterus. Hanging drop test, fluid instillation test, and fluid aspiration test showed proper placement of the Veress needle. CO2 was placed on low flow. When tympany was heard around the liver, CO2 was placed on high flow. The patient was placed in Trendelenburg and a trocar was placed. The aforementioned findings were seen. The peritoneum was injected with Marcaine and dissected out. (Dictation Anomaly) the area was dissected and the mass was cut and sent for pathology. Attention was then turned to the uterus, where the cervix was serially dilated to accommodate the operative scope. The scope was placed and the polyp was identified. D and C was performed and the polyp was removed with cautery.A gentle  D and C was then used to make the (Dictation Anomaly) cavity even and decrrease the tissue volume. Good hemostasis was identified. Single-tooth tenaculum was removed from the anterior lip of the cervix, and the patient's uterus was found to be firm. Attention was then turned back to the  abdomen and pelvis, where Interceed was wrapped around both ovaries. The Interceed was laid down like a cloth underneath the uterus so as not to obliterate the posterior cul-de-sac. The patient was then laid supine and taken to recovery after having tolerated the procedure well.   ____________________________ Delsa Sale, MD cck:jcm D: 06/23/2013 10:15:05 ET T: 06/23/2013 21:34:58 ET JOB#: [id number]  cc: Delsa Sale, MD, <Dictator> Delsa Sale MD ELECTRONICALLY SIGNED 07/03/2013 9:35

## 2014-06-08 NOTE — Consult Note (Signed)
Brief Consult Note: Diagnosis: Najor depressive disorder recurrent severe.   Patient was seen by consultant.   Recommend further assessment or treatment.   Orders entered.   Comments: will admit to psychiatry.  Electronic Signatures: Orson Slick (MD)  (Signed 05-Jun-15 12:58)  Authored: Brief Consult Note   Last Updated: 05-Jun-15 12:58 by Orson Slick (MD)

## 2014-06-08 NOTE — Consult Note (Signed)
PATIENT NAME:  [name], MARANTO MR#:  [id number] DATE OF BIRTH:  June 04, 1975  DATE OF CONSULTATION:  02/06/2014  REFERRING PHYSICIAN:   CONSULTING PHYSICIAN:  Lucilla Lame, MD  CONSULTING SERVICE: Gastroenterology.   REASON FOR CONSULTATION: Nausea, vomiting, diarrhea, and abdominal pain.   HISTORY OF PRESENT ILLNESS: This patient is comes in with a report of nausea, vomiting, and diarrhea that she says started after she started taking medication for diabetes. The patient called EMS and was brought in by EMS and was now admitted to labor and delivery because she is [redacted] weeks pregnant. The patient has had a report that she took 2 doses of a medication and started to have the present symptoms. She denies any black stools or bloody stools. She also denies any vomiting of blood. The patient was started on Carafate, Imodium, pain medication, and the nausea had decreased, but is still present, but the vomiting has stopped and so has the diarrhea. She continues to have abdominal pain which she reports to be mostly in the middle to right upper quadrant. The patient has had her gallbladder removed in the past, and also has had gastric bypass surgery with complications of the surgery requiring 5 surgeries in total. She denies any recent weight loss.   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   FAMILY HISTORY: Colon cancer, lung cancer, melanoma, DVTs, anemia.   SOCIAL HISTORY: The patient does not smoke or drink.   HOME MEDICATIONS: Include zolpidem, vitamin B12, multivitamin, and a perinatal vitamin.   REVIEW OF SYSTEMS: A 12-point review of systems was negative.    ALLERGIES: AMOXICILLIN, MORPHINE, PENICILLIN, AND SULFA.   PHYSICAL EXAMINATION: GENERAL: The patient is lying in bed with waves of spasms causing her to hold her abdomen, without distress.  HEENT: Normocephalic, atraumatic.  Extraocular motor intact. Pupils equally round and reactive to light and accommodation without JVD, without lymphadenopathy.  LUNGS: Clear to auscultation bilaterally.  HEART: Regular rate and rhythm without murmurs, rubs, or gallops.  ABDOMEN: Gravid, positive tenderness in the epigastric to right upper quadrant. The patient had a positive Carnett sign with increased pain while lifting the leg 6 inches above the exam table while palpating with 1 finger.  EXTREMITIES: Without cyanosis, clubbing, or edema.  NEUROLOGIC: Grossly intact.   ANCILLARY SERVICES AND LABORATORIES: Total bilirubin 0.1. AST and ALT normal with alkaline phosphatase of 35. White cells 11.5, hemoglobin 11.4. Clostridium difficile was negative. Stool pathogens were also negative. Ultrasound of the abdomen did not show any cause for her abdominal pain and her common bile duct was within normal limits.   ASSESSMENT AND PLAN: This patient is a 39 year old woman who has what appears to be viral gastroenteritis, although there is some temporal proximity to her starting a new medication. It might explain some of her symptoms but unlikely all of them. Her nausea and vomiting have improved with Carafate and Imodium. Her pain is also better with the pain medications. The patient has not had any further diarrhea all night long and has just been passing gas. The patient's abdominal pain, which seems to be her main complaint at the present time, is clearly musculoskeletal with reproducing of the pain with flexion of the abdominal wall muscles while palpating the abdominal wall with one finger. The patient has been explained this and I have spoken to Dr. Kenton Kingfisher and she will be started on Flexeril 5 mg 3 times a day for the  muscle pain. She will also be given some warm compresses to help relax the abdominal wall muscles. I have discussed with Dr. Kenton Kingfisher about possibly initiating steroids if her symptoms do not improve. I have also told the patient the  plan and if the pain does not get better or the diarrhea, nausea and vomiting return, the patient may need an MRI of the abdomen. Of note, the patient also feels better with shortening of the abdominal wall muscles by leaning forward and by bending her knees, which is also indicative of muscle wall pain.   Thank you very much for involving me in the care of this patient. Dr. Vira Agar will be covering for me over the holidays. Please do not hesitate to call if you have any questions.    ____________________________ Lucilla Lame, MD dw:at D: 02/06/2014 16:32:21 ET T: 02/06/2014 16:46:53 ET JOB#: [id number]  cc: Lucilla Lame, MD, <Dictator> Lucilla Lame MD ELECTRONICALLY SIGNED 02/13/2014 8:19

## 2014-06-08 NOTE — Consult Note (Signed)
Brief Consult Note: Diagnosis: Major depressive disorder recurrent severe.   Patient was seen by consultant.   Recommend further assessment or treatment.   Orders entered.   Comments: The patient changed her mind. She no longer wishes to be admitted.   PLAN: 1. She does not meet criteria for IVC. I will d/c admission orders. Please discharge as appropriate.  2. She responded well to low dose seroquel in the ER. Start Seroquel 25 mg tid. Rx given.  3. She will follow up with me..  Electronic Signatures: Orson Slick (MD)  (Signed 05-Jun-15 16:12)  Authored: Brief Consult Note   Last Updated: 05-Jun-15 16:12 by Orson Slick (MD)

## 2014-06-08 NOTE — Consult Note (Signed)
Brief Consult Note: Diagnosis: Patient with nausea vomiting and abd pain that started after starting medication for high sugers. She is s/p gastric bypass with 5 sugeries post bypass for complications.   Comments: The patients abd is soft and shows no sigh of obstruction. The abd pain has a positive Carnett sign for muscular pain. She will be treated with flexeril and steroids may be added if needed. Her nausea, vomiting and diarrhea is better with imodium and carafate and this will be continued.  Electronic Signatures: Lucilla Lame (MD)  (Signed 23-Dec-15 08:26)  Authored: Brief Consult Note   Last Updated: 23-Dec-15 08:26 by Lucilla Lame (MD)

## 2014-06-16 NOTE — Consult Note (Signed)
Referral Information:  Reason for Referral 39 yo gravida 2 para 1001 at [redacted] weeks gestation with poorly controlled GDM is admitted for [facility] management and MFM is consulted for recommendations.  Her fasting blood sugars have been normal, but her postprandials have exceeded 200.  She was tried on glyburide, with doses up to 10 mg, but she had vasovagal reactions.  She is currently 1.25 mg in the morning, which she did not take this morning.  Even this small dose is upsetting to her stomach  She has been seen by Dr. Nehemiah Massed this pregnancy for syncopal events.  EF 50%.   Referring Physician [facility]   Past Obstetrical Hx 09/05/1997 - spontaneous vaginal delivery of 7 lb 11 oz female infant at [facility].  Pregnancy complicated by preeclampsia.   Home Medications:  Medication Instructions Status  folic acid 1 mg oral tablet 3 tab(s) orally once a day Active  Vitamin B-12 1000 mcg/mL injectable solution 1 milliliter(s) injectable once a week on Thursday Active  acetaminophen 500 mg oral tablet 2 tab(s) orally every 6 hours, As Needed Active  Prenatal Multivitamins Prenatal Multivitamins oral tablet 1 tab(s) orally once a day Active   Allergies:   Penicillin: Hives  Amoxicillin: Hives  Sulfa drugs: GI Distress  Morphine: None  Vital Signs/Notes:  Nursing Vital Signs: **Vital Signs.:   11-Jan-16 12:29  Vital Signs Type Routine  Temperature Temperature (F) 97.7  Celsius 36.5  Temperature Source oral  Pulse Pulse 75  Systolic BP Systolic BP 081  Diastolic BP (mmHg) Diastolic BP (mmHg) 68  Mean BP 80   Perinatal Consult:  PMed Hx anemia, anxiety, DJD, arthritis, asthma, palpitations, obesity   Past Medical History cont'd 5 hospitalizations and surgeries for bowel obstructions since gastric by-pass   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   Review Of Systems:  Fever/Chills No    Cough No    Abdominal Pain Yes    Diarrhea No    Constipation No    Nausea/Vomiting No    SOB/DOE No    Chest Pain No    Dysuria No    Tolerating Diet Yes    Medications/Allergies Reviewed Medications/Allergies reviewed  She is also on Zoloft and Clonazepam    Exam:  Today's Weight 194lbs;BMI=31     Hepatic:  11-Jan-16 10:49   Bilirubin, Total 0.3  Alkaline Phosphatase  41 (46-116 NOTE: New Reference Range 09/04/13)  SGPT (ALT)  10 (14-63 NOTE: New Reference Range 09/04/13)  SGOT (AST) 17  Total Protein, Serum  6.2  Albumin, Serum  2.6  Routine Chem:  11-Jan-16 10:49   Glucose, Serum  147  BUN  5  Creatinine (comp)  0.52  Sodium, Serum 138  Potassium, Serum 4.0  Chloride, Serum 106  CO2, Serum 24  Calcium (Total), Serum  8.4  Osmolality (calc) 276  eGFR (African American) >60  eGFR (Non-African American) >60 (eGFR values <24m/min/1.73 m2 may be an indication of chronic kidney disease (CKD). Calculated eGFR, using the MRDR Study equation, is useful in  patients with stable renal function. The eGFR calculation  will not be reliable in acutely ill patients when serum creatinine is changing rapidly. It is not useful in patients on dialysis. The eGFR calculation may not be applicable to patients at the low and high extremes of body sizes, pregnant women, and vegetarians.)  Anion Gap 8  Hemoglobin A1c ([facility]) 4.8 (The American Diabetes Association recommends that a primary goal of therapy should be <7% and that physicians should reevaluate the treatment regimen in patients with HbA1c values consistently >8%.)  Misc Urine Chem:  11-Jan-16 10:22    Creatinine, Urine 75.4  Protein, Random Urine  14  Protein/Creat Ratio (comp) 186 (Result(s) reported on 25 Feb 2014 at 11:18AM.)  Routine UA:  11-Jan-16 10:22   Color (UA) Yellow  Clarity (UA) Clear  Glucose (UA) Negative  Bilirubin (UA) Negative  Ketones (UA) 1+  Specific Gravity (UA) 1.014  Blood (UA) Negative  pH (UA) 6.0  Protein (UA) Negative  Nitrite (UA) Negative  Leukocyte Esterase (UA) Negative (Result(s) reported on 25 Feb 2014 at 11:11AM.)  RBC (UA) 2 /HPF  WBC (UA) 1 /HPF  Bacteria (UA) NONE SEEN  Epithelial Cells (UA) 1 /HPF  Mucous (UA) PRESENT (Result(s) reported on 25 Feb 2014 at 11:11AM.)  Routine Hem:  11-Jan-16 10:49   WBC (CBC) 6.8  RBC (CBC) 3.83  Hemoglobin (CBC)  11.9  Hematocrit (CBC) 37.0  Platelet Count (CBC) 262  MCV 96  MCH 31.0  MCHC 32.2  RDW 13.3  Neutrophil % 81.5  Lymphocyte % 14.3  Monocyte % 3.3  Eosinophil % 0.5  Basophil % 0.4  Neutrophil # 5.6  Lymphocyte # 1.0  Monocyte # 0.2  Eosinophil # 0.0  Basophil # 0.0 (Result(s) reported on 25 Feb 2014 at 11:09AM.)    Additional Lab/Radiology Notes 02/04/2014 - HbA1C was 5.5 12/28/2013 - EF "low normal" of 50% 11/07/2013 - 48 hr Holter - NSR with occ PAC and PVC  02/18/2013 - [place] for growth - EFW 1875 g (65%tile), AC 92%tile, AFI = 16.8 cm  FBS 72 this AM; 2 hr PP 88   Impression/Recommendations:  Impression 39 yo gravida 2 para 1001 at [redacted] weeks gestation with poorly controlled GDM and evidence of diabetic fetopathy is admitted for BS management and MFM is consulted for recommendations.  Normal fasting blood   Recommendations 1. Continue FBSs and 2 hr PPs 2. Discontinue glyburide 3. Bolus dose insulin only - Novolog or Humalog - 5-7 units with each meal. Hold if she has no further hyperglycemia.  Hold for no meal or small meal.   Plan:  Ultrasound at what gestational ages Monthly > 28 weeks   Antepartum Testing Daily now; Twice weekly after discharge   Delivery at what  gestational age [redacted] weeks gestation   Comment/Plan Thank you for allowing me to participate in her care.  I can be reached for further questions on my cell:  [phone]    Total Time Spent with Patient 45 minutes   >50% of visit spent in couseling/coordination of care yes   Office Use Only [place] Focused (40 min)   Coding Description: MATERNAL CONDITIONS/HISTORY INDICATION(S).   Diabetes - Gestational GDM.  Electronic Signatures: Dellia Nims (MD)  (Signed 11-Jan-16 17:19)  Authored: Referral, Home Medications, Allergies, Vital Signs/Notes, Consult, Exam, Lab, Lab/Radiology Notes, Impression, Plan, Billing, Coding Description   Last Updated: 11-Jan-16 17:19 by Dellia Nims (MD)

## 2014-06-16 NOTE — Consult Note (Signed)
Referral Information:  Reason for Referral 39 yo gravida 2 para 1001 at 36 weeks 3 days gestation with poorly controlled GDM is referred to [facility] for transfer of care and further management.   Referring Physician [facility] - Dr. Star Age   Prenatal Hx Dated by LMP of 07/17/2013 and [id number]  EDD 04/23/2014  Throughout pregnancy, her fasting blood sugars have been normal, but her postprandials have ranged from 50 to > 250.  She was inititally tried on glyburide starting in December, with doses up to 10 mg, but she had nausea, vomiting, abdominal pain and hypoglycemic episodes.  I first met her when she was hospitalized for BS control in January.  Even doses of 1.25 of glyburide were upsetting to her stomach.  She was started on Novolog 5 mg with each meal.  Despite a diabetic diet and consitent doses of insulin, her postprandial blood sugars have been erratic.  She was hospitalized last week, and immediately prior to discharge she had a postprandial blood sugar of 51.  She was told to take her insulin only if her pre-meal blood sugar was > 120.  [facility] her regimen of 5 mg of Novolog with each meal.    On 02/19/2014 the EFW was at the 65% with an AC at the 92%tile, consistent with diabetic fetopathy.  Her most recent [place] was 03/18/2014 at which time the fetus was cephalic, EFW 21696g, 378%LFYB AC 47%tile.    Her pregnancy has been further complicated by her history of Roux-en-Y gastric bypass.  Since December she has had right upper quadrant pain that is intermittent and lasting for 30 minute episodes.  Today it is constant.    She has also had syncopal episodes this pregnancy and was evaluated by Dr. KAdolph Pollack cardiologist.  Her EF was 50% on echo.  At her last prenatal visit on 03/25/2014, she had a GBS culture done and an AFI performed.  Her cervix was 3 cm dilated.  Patient has been receiving iron  infusions for anemia and has appointment with Dr. FGrayland Ormondthis afternoon.   Past Obstetrical Hx 09/05/1997 - [facility]  Pregnancy complicated by preeclampsia.   Home Medications: Medication Instructions Status  multivitamin, prenatal 1 tab(s) orally once a day Active  folic acid 1 mg oral tablet 1 tab(s) orally once a day Active  sertraline 50 mg oral tablet 1 tab(s) orally once a day Active  clonazePAM 1 mg oral tablet 1 tab(s) orally 3 times a day Active  Ambien 10 mg oral tablet 1 tab(s) orally once a day (at bedtime) Active  NovoLOG 100 units/mL subcutaneous solution 5 unit(s) subcutaneous 3 times a day Active   Allergies:   Penicillin: Hives  Amoxicillin: Hives  Sulfa drugs: GI Distress  Morphine: None  Perinatal Consult:  PMed Hx anemia, anxiety, DJD, arthritis, asthma, palpitations, obesity   Past Medical History cont'd 5 hospitalizations and surgeries for bowel obstructions since gastric bypass   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   Review Of Systems:  Fever/Chills No   Cough No   Abdominal Pain Yes   Diarrhea No   Constipation No   Nausea/Vomiting No   SOB/DOE No   Chest Pain No   Dysuria No   Tolerating Diet Yes   Medications/Allergies Reviewed Medications/Allergies reviewed  She is also on Zoloft and Clonazepam   Exam:  Temp 97.6/36.4   Pulse 106   Respirations 36   Blood Pressure 119/80   Today's Weight 192lbs;BMI=30   Abdomen nontender, FHR 140    Additional  Lab/Radiology Notes 02/04/2014 - HbA1C was 5.5 12/28/2013 - EF "low normal" of 50% 11/07/2013 - 48 hr Holter - NSR with occ PAC and PVC  02/18/2013 - [place] for growth - EFW 1875 g (65%tile), AC 92%tile, AFI = 16.8 cm  BSs from this week: FBSs all normal 2 hr PP breakfast - 93, 174, 260 2 hr PP lunch - 65, 180, 264 2 hr PP dinner - 115 , 174, 220  Today 03/28/2014 - Hb 13; Hct 38.8; platelets 297; WBC 8.0 03/18/2014 - [place] for growth - EFW 2403 g (35%tile), AC 47%tile 03/25/2014 - AFI - results?  FBS 72 this AM; 2 hr PP 88   Impression/Recommendations:  Impression 39 yo gravida 2 para 1001 at 36 weeks 3 days gestation with:  1. Poorly controlled GDM and previous evidence of diabetic fetopathy  2. RUQ pain in the setting of previous Roux-en-Y bypass and a history of 5 hospitalizations and surgeries for bowel obstruction 3. History of anemia this pregnancy - now with normal Hb 4. Anxiety/depression - stable on meds (she only takes the clonazepam once a day)   Recommendations I have spoken to Dr. Diamantina Monks and we will plan to admit to [facility] for further evaluation of gestational diabetes and bowel symptoms -- endocrine consult -- general surgery consult We should plan to deliver at [redacted] weeks gestation We can cancel her iron infusion today In the interim, I will continue to try to obtain her GBS results and her AFI results from 03/25/2014.   Plan:  Antepartum Testing Daily now   Delivery at what gestational age [redacted] weeks gestation   Comment/Plan Thank you for allowing me to participate in her care.  I can be reached for further questions on my cell:  [phone]    Total Time Spent with Patient 45 minutes   >50% of visit spent in couseling/coordination of care yes   Office Use Only [place]  Office Visit Level 5 (59mn) EST comprehensive office/outpt   Coding Description: MATERNAL CONDITIONS/HISTORY INDICATION(S).   Diabetes - Gestational GDM.  Electronic Signatures: JDellia Nims(MD)   (Signed 11-Feb-16 12:50)  Authored: Referral, Home Medications, Allergies, Consult, Exam, Lab/Radiology Notes, Impression, Plan, Billing, Coding Description   Last Updated: 11-Feb-16 12:50 by JDellia Nims(MD)

## 2014-06-16 NOTE — Consult Note (Signed)
   Maternal Age 39   Gravida 2   Para 1   Term Deliveries 1   Preterm Deliveries 0   Abortions 0   Living Children 1   Final EDD ([id number]) 22-Apr-2013   Gestation Single   Blood Type (Maternal) A negative   Antibody Screen Results (Maternal) negative   HIV Results (Maternal) negative   Gonorrhea Results (Maternal) negative   Chlamydia Results (Maternal) negative   Hepatitis C Culture (Maternal) unknown   Herpes Results (Maternal) n/a   VDRL/RPR/Syphilis Results (Maternal) negative   Varicella Titer Results (Maternal) Positive   Rubella Results (Maternal) nonimmune   Hepatitis B Surface Antigen Results (Maternal) negative   Group B Strep Results Maternal (Result >5wks must be treated as unknown) unknown/result > 5 weeks ago    Additional Comments I met with Ms. Harnack, a 50 year old G2P1 currently admitted at [redacted] weeks gestation for management of GDM, to discuss in utero exposure to SSRIs and benzodiazepines and risk of withdrawal in her infant after delivery.  She discontinued her previous medication regimen for anxiety and depression early in her pregnancy, but is now taking clonazepam, zoloft, prn xanax and prn ambien after experiencing exacerbation of her mood swings.  We discussed the importance of optimizing maternal health and the ultimate benefit of her psychological stability to the well-being of her baby.  Long term effects of in utero exposure to these classes of medications are not well understood, but at this time they are not contraindicated.  These infants are often noted to be tremulous and more irritable in the days to weeks after delivery, occasionally to the point of feeding poorly and losing weight.  I advised that her pediatrician will likely observe the infant in the hospital for at least three days after delivery to ensure adequate feeding pattern with close followup as an outpatient.  On rare occasions, an infant may require treatment with an agent  such as phenobarbital, but this is unlikely.  If Ms. Harney is medically stable for discharge prior to her baby, the infant can be admitted to the pediatric service on the floor for continued observation if warranted, with mother staying in the room.  All questions were answered during our consult.  I would be happy to revisit if new concerns arise. -Joana Reamer, MD   Electronic Signatures: Rhona Raider (MD)  (Signed 12-Jan-16 11:46)  Authored: PREGNANCY and LABOR, ADDITIONAL COMMENTS   Last Updated: 12-Jan-16 11:46 by Rhona Raider (MD)

## 2014-06-16 NOTE — Consult Note (Signed)
PATIENT NAME:  [name], [name] MR#:  [id number] DATE OF BIRTH:  December 02, 1975  DATE OF CONSULTATION:  03/22/2014  REFERRING PHYSICIAN:  Florina Ou. Bonney Aid, MD  CONSULTING PHYSICIAN:  Mcgregor Tinnon H. Allena Katz, MD  PRIMARY CARE PROVIDER: [facility].  REASON FOR CONSULTATION: Gestational diabetes.  HISTORY OF PRESENT ILLNESS: The patient is a 39 year old at 35 weeks 3 days with estimated date of confinement 04/23/2014 who was sent from clinic for poorly controlled blood glucose. The patient was initially managed with glyburide; however, she was not able to tolerate this and was switched to NovoLog 5 units before meals t.i.d. a few days prior. The patient's blood sugars during certain post meals have been elevated. She also has had complaints of right upper quadrant abdominal pain; has had some nausea, vomiting, but no diarrhea; denies any urinary symptoms.   PAST MEDICAL HISTORY: 1.  Significant for chronic pain syndrome from degenerative joint disease. 2.  Gastric bypass. 3.  History of vitamin D deficiency. 4.  Iron deficiency. 5.  Depression. 6.  Anxiety. 7.  Status post cholecystectomy.  FAMILY HISTORY: Positive for breast cancer, colon cancer, lung cancer, melanoma, anemia, DVT, heart disease, hypertension.  SOCIAL HISTORY: Does not smoke, does not drink, no drugs.   MEDICATIONS AT HOME: Ambien 10 at bedtime p.r.n. sleep, sertraline 50 q. daily, NovoLog 5 units before meals, multivitamin prenatal q. daily, folic acid 1 mg q. daily, clonazepam 1 mg t.i.d., alprazolam 1 mg p.o. t.i.d. as needed for anxiety.   REVIEW OF SYSTEMS: CONSTITUTIONAL: Denies any fevers, chills. Weight gain related to pregnancy. HEENT: Denies any blurred vision. No double vision. No cataract. No glaucoma. Denies any epistaxis, nasal congestion. No seasonal or year-round allergies. No difficulty swallowing. No hearing trouble or ringing in the ears. CARDIOVASCULAR: Denies any chest pain,  palpitations, or syncope. PULMONARY: Denies any cough, wheezing, hemoptysis. No COPD. GASTROINTESTINAL: History of gastric bypass with right upper quadrant pain. No jaundice. No diarrhea. GENITOURINARY: Denies any frequency, urgency, or hesitancy. SKIN: No rash. LYMPHATICS: No lymph nodes. NEUROLOGIC: No CVA, TIA. PSYCHIATRIC: Has depression, anxiety.  PHYSICAL EXAMINATION: VITAL SIGNS: Temperature 97.7, pulse 77, respirations 18, blood pressure 110/67, oxygen saturation 99%. GENERAL: Patient is a well-developed, well-nourished female in no acute distress. HEENT: Head atraumatic, normocephalic. Pupils equally round, reactive to light and accommodation. There is no conjunctival pallor, no scleral icterus. Nasal exam shows no drainage or ulceration. Oropharynx is clear without any exudate. NECK: Supple without any thyromegaly.  CARDIOVASCULAR: Regular rate and rhythm. No murmurs, rubs, clicks, or gallops. LUNGS: Clear to auscultation bilaterally without any rales, rhonchi, or wheezing.  ABDOMEN: Gravid, nontender. EXTREMITIES: No edema. SKIN: No rash. LYMPH NODES: Nonpalpable. MUSCULOSKELETAL: There is no erythema or swelling. VASCULAR: Good DPT pulses. PSYCHIATRIC: Not anxious or depressed. NEUROLOGIC: Cranial nerves II-XII grossly intact. No focal deficits.  ASSESSMENT AND PLAN: The patient is a 39 year old in the third trimester of pregnancy with gestational diabetes with poorly controlled blood sugars at this time. 1.  Gestational diabetes. Agree with the premeal insulin and starting her on Lantus today. Her blood sugars will need to be followed premeal and 2 hours post meal. Will adjust her diabetic regimen based on her sugars. Do recommend that patient be followed up as outpatient by endocrinology for tighter blood sugar control. 2.  Depression, anxiety. Resume her home medications as felt appropriate per admitting MD. 3.  History of iron-deficiency anemia. Planned for another  transfusion next week.   TIME SPENT: 50 minutes on this  patient.   ___________________________ Lacie Scotts Allena Katz, MD shp:ST D: 03/22/2014 18:50:16 ET T: 03/22/2014 21:37:41 ET JOB#: [id number]  cc: Royelle Hinchman H. Allena Katz, MD, <Dictator> Charise Carwin MD ELECTRONICALLY SIGNED 04/04/2014 14:21

## 2014-06-25 NOTE — H&P (Signed)
L&D Evaluation:  History Expanded:  HPI 39 yo at 30 weeks w n/v/d and pain starting today.  Preg compl by GDM and was just started on Glyburide yesterday.  Has h/o multiple GI procedures including gastric bypass surgery.  Prior difficult delivery 16 years ago.   Gravida 2   Term 1   Patient's Medical History Asthma  Anxiety, DJD   Patient's Surgical History Fibroid surgery, Gastric Bypass Surgery, Hysteroscopy   Medications Pre Natal Vitamins  Glyburide   Allergies PCN   Social History none   Family History Non-Contributory   ROS:  ROS All systems were reviewed.  HEENT, CNS, GI, GU, Respiratory, CV, Renal and Musculoskeletal systems were found to be normal.   Exam:  Vital Signs stable   General no apparent distress   Mental Status clear   Abdomen gravid, tender   Estimated Fetal Weight Average for gestational age   Back no CVAT   Edema no edema   FHT normal rate with no decels   Ucx absent   Skin dry   Impression:  Impression Dehydration, nausea, vomiting, diarrhea, gasteroenteritis, Glyburide intolerence   Plan:  Plan EFM/NST, fluids   Comments Zofran Analgesia CMP   Electronic Signatures: Hoyt Koch (MD)  (Signed 22-Dec-15 17:30)  Authored: L&D Evaluation   Last Updated: 22-Dec-15 17:30 by Hoyt Koch (MD)

## 2014-06-25 NOTE — H&P (Signed)
L&D Evaluation:  History:  HPI 39 yo at 35 weeks 3 days with an EDC of 04/23/14 sent from clinic for poorly controlled gestational diabetes.  She has had a prior admission for poorly controlled blood sugars and was initially manged with glyburide.  She was switched to novolog 5U AC TID earlier this week with subsequent BG measurements  Fasting      2-hr post breakfast     2-hr post lunch   2-hr post dinner 75             89                             106                   170 81             252                            181                   139 84            180     Has h/o multiple GI procedures including gastric bypass surgery.  Prior difficult delivery 16 years ago and complicated by preeclampsia. She is A-, RNI, VI.   Presents with Poorly controlled GDM   Patient's Medical History Asthma  Anxiety, DJD   Patient's Surgical History Hysteroscopy, D&C, and polypectomy as well as laproscopic voarian cystectomy 2015, Gastric Bypass Surgery (Roux-en-Y 2009),   Medications Pre Natal Vitamins  Short acting insulin 5U AC TID   Allergies PCN   Social History none   Family History Non-Contributory   ROS:  ROS All systems were reviewed.  HEENT, CNS, GI, GU, Respiratory, CV, Renal and Musculoskeletal systems were found to be normal.   Exam:  Vital Signs stable  glucose 80   General no apparent distress   Mental Status clear   Abdomen gravid, non-tender, gravid   Back no CVAT   Edema no edema   FHT normal rate with no decels, 1340s, moderate variability, +accels, no decels- cat 1 FHT   Ucx absent   Skin dry, no lesions, no rashes   Lymph no lymphadenopathy   Impression:  Impression reactive NST, IUP [redacted]w[redacted]d poorly controlled GDM admitted for BG management   Plan:  Plan discharge   Comments 1) GDM - AM fasting and 2-hr postprandial BG measurement.  Fasting goal <95, 2-hr postprandials <140.  Insulin requirement by trimester is 0.7U/kg 1st trimester, 0.8U/kg 2nd  trimester, and 0.9U/Kg 3rd trimester.  Given patient current weight of ~90 KG, starting conservatively at 0.7U/kg would equal to 60U insulin with 2/3 long acting and 1/3 short acting.  A reasonable regimen would therefore be 40 units glargine, 20 units of short acting.  Given her bypass and at time low glucose readings I will start the patient on a conservative dose of 6U novolog, and 10 unit of glargine. - will obtain internal medicine consult to assist in managment - HGB A1C 5.5 on 09/26/13, repeat 5.5 on 02/04/14 - BMP with BG of 138, AG of 8 - UA pending to look for ketones - patient care being transferred to [facility] put has not been seen yet  2) Fetus - category 1 tracing - EFW 2403g (5lbs 5oz) c/w 35.4%ile on 03/18/2014  3)  FEN - diabetic diet calorie restricted to 1400Kcal  4) PNL A neg / ABSC neg / RNI / VZI / HBsAG neg / HIV neg / RPR NR / Informaseq XX / GBS unknown  5) Prior syncopal event this pregnancy - seen by Dr. Nehemiah Massed with normal TTE showing EF of 50%  6) Disposition - pending improvement in glycemic control   Electronic Signatures: Dorthula Nettles (MD)  (Signed 05-Feb-16 17:39)  Authored: L&D Evaluation   Last Updated: 05-Feb-16 17:39 by Dorthula Nettles (MD)

## 2014-06-25 NOTE — H&P (Signed)
L&D Evaluation:  History:  HPI 39 yo at 33.2 weeks with an EDC of 04/23/14. Pt presents to L&D after being sent over from the office with a non-reactive NST.  Preg compl by GDM with some elevated BG levels to the 200s at one point in time. She is not on any medications due to her intolerance of them.  Has h/o multiple GI procedures including gastric bypass surgery.  Prior difficult delivery 16 years ago. She is A-, RNI, VI.   Presents with non reactive NST   Patient's Medical History Asthma  Anxiety, DJD   Patient's Surgical History Fibroid surgery, Gastric Bypass Surgery, Hysteroscopy   Medications Pre Natal Vitamins   Allergies PCN   Social History none   Family History Non-Contributory   ROS:  ROS All systems were reviewed.  HEENT, CNS, GI, GU, Respiratory, CV, Renal and Musculoskeletal systems were found to be normal.   Exam:  Vital Signs stable  glucose 80   General no apparent distress   Mental Status clear   Abdomen gravid, non-tender, gravid   Estimated Fetal Weight Average for gestational age   Back no CVAT   Edema no edema   FHT normal rate with no decels, 140s, moderate variability, +accels, no decels- cat 1 FHT   Ucx absent   Skin dry, no lesions, no rashes   Lymph no lymphadenopathy   Impression:  Impression reactive NST, IUP at 33.2, reactive NST   Plan:  Plan discharge, keep scheduled appt.   Follow Up Appointment already scheduled   Electronic Signatures: Louisa Second (CNM)  (Signed 21-Jan-16 11:12)  Authored: L&D Evaluation   Last Updated: 21-Jan-16 11:12 by Louisa Second (CNM)

## 2014-07-18 ENCOUNTER — Telehealth: Payer: Self-pay

## 2014-07-18 NOTE — Telephone Encounter (Signed)
pt called needs sleep med dosage to be increase; .  restoril 30mg  or adding trazodone per pt. please advised

## 2014-07-19 NOTE — Telephone Encounter (Signed)
Why are Dr Mamie Nick request being sent to me ?

## 2014-07-23 ENCOUNTER — Other Ambulatory Visit: Payer: Self-pay

## 2014-07-23 MED ORDER — TEMAZEPAM 30 MG PO CAPS: 30.0000 mg | ORAL_CAPSULE | Freq: Every evening | ORAL | Status: DC | PRN

## 2014-07-23 NOTE — Telephone Encounter (Signed)
Marcie Bal, RN , went to [facility] to speak with dr. Bary Leriche about this patient. Dr. Bary Leriche oked the restoril 30mg  refilled.

## 2014-07-23 NOTE — Telephone Encounter (Signed)
Floyde Parkins, RN this writer received verbal orders to increase Restoril to 30mg  qhs prn #30 with no refills. Verbal order obtained from Dr. Bary Leriche

## 2014-07-31 ENCOUNTER — Ambulatory Visit: Payer: Medicaid Other | Admitting: Psychiatry

## 2014-08-06 ENCOUNTER — Other Ambulatory Visit: Payer: Self-pay | Admitting: Psychiatry

## 2014-08-09 ENCOUNTER — Other Ambulatory Visit: Payer: Self-pay | Admitting: Psychiatry

## 2014-08-15 ENCOUNTER — Other Ambulatory Visit: Payer: Self-pay | Admitting: Psychiatry

## 2014-08-27 ENCOUNTER — Other Ambulatory Visit: Payer: Self-pay | Admitting: Psychiatry

## 2014-10-01 ENCOUNTER — Other Ambulatory Visit: Payer: Self-pay | Admitting: Psychiatry

## 2014-10-09 ENCOUNTER — Encounter: Payer: Self-pay | Admitting: Psychiatry

## 2014-10-09 ENCOUNTER — Ambulatory Visit (INDEPENDENT_AMBULATORY_CARE_PROVIDER_SITE_OTHER): Payer: Medicaid Other | Admitting: Psychiatry

## 2014-10-09 VITALS — BP 118/84 | HR 100 | Temp 97.1°F | Ht 67.0 in | Wt 201.8 lb

## 2014-10-09 DIAGNOSIS — M5136 Other intervertebral disc degeneration, lumbar region: Secondary | ICD-10-CM | POA: Insufficient documentation

## 2014-10-09 DIAGNOSIS — M7660 Achilles tendinitis, unspecified leg: Secondary | ICD-10-CM | POA: Insufficient documentation

## 2014-10-09 DIAGNOSIS — R9431 Abnormal electrocardiogram [ECG] [EKG]: Secondary | ICD-10-CM | POA: Insufficient documentation

## 2014-10-09 DIAGNOSIS — F410 Panic disorder [episodic paroxysmal anxiety] without agoraphobia: Secondary | ICD-10-CM

## 2014-10-09 DIAGNOSIS — F329 Major depressive disorder, single episode, unspecified: Secondary | ICD-10-CM | POA: Insufficient documentation

## 2014-10-09 DIAGNOSIS — F419 Anxiety disorder, unspecified: Secondary | ICD-10-CM | POA: Insufficient documentation

## 2014-10-09 DIAGNOSIS — E669 Obesity, unspecified: Secondary | ICD-10-CM | POA: Insufficient documentation

## 2014-10-09 DIAGNOSIS — F32A Depression, unspecified: Secondary | ICD-10-CM | POA: Insufficient documentation

## 2014-10-09 MED ORDER — CLONAZEPAM 1 MG PO TABS: 1.0000 mg | ORAL_TABLET | Freq: Three times a day (TID) | ORAL | Status: DC

## 2014-10-09 MED ORDER — ZOLPIDEM TARTRATE 10 MG PO TABS: 10.0000 mg | ORAL_TABLET | Freq: Every evening | ORAL | Status: DC | PRN

## 2014-10-09 MED ORDER — ALPRAZOLAM 1 MG PO TABS: 1.0000 mg | ORAL_TABLET | Freq: Every day | ORAL | Status: DC | PRN

## 2014-10-09 MED ORDER — DULOXETINE HCL 60 MG PO CPEP: 60.0000 mg | ORAL_CAPSULE | Freq: Every day | ORAL | Status: DC

## 2014-10-09 MED ORDER — BUPROPION HCL ER (XL) 150 MG PO TB24: 150.0000 mg | ORAL_TABLET | ORAL | Status: DC
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
## 2014-10-11 ENCOUNTER — Telehealth: Payer: Self-pay

## 2014-10-11 NOTE — Telephone Encounter (Signed)
pt called back. pt was told that the cymbalta could not be increased anymore to take how she been [place] it.  no increase.

## 2014-10-11 NOTE — Telephone Encounter (Signed)
pt called back she states that she does have the rx for the ambien but she has not turned it in yet to be filled but she states that she was glad I called because when she came into the office she did not know the mg of the cymbalta she thought it was 20mg  but she is really taking 60 mg

## 2014-10-11 NOTE — Telephone Encounter (Signed)
left message for pt to call me back in regards to rx for ambien. according to system rx was printed and given to pt.  (received a requested from [facility])

## 2014-10-11 NOTE — Telephone Encounter (Signed)
left message to call office back in regards to medication

## 2014-11-04 ENCOUNTER — Other Ambulatory Visit: Payer: Self-pay

## 2014-11-04 DIAGNOSIS — F410 Panic disorder [episodic paroxysmal anxiety] without agoraphobia: Secondary | ICD-10-CM

## 2014-11-04 MED ORDER — ALPRAZOLAM 1 MG PO TABS: 1.0000 mg | ORAL_TABLET | Freq: Every day | ORAL | Status: DC | PRN

## 2014-11-04 NOTE — Telephone Encounter (Signed)
spoke with patient rx was sent and received confirmation.

## 2014-11-04 NOTE — Telephone Encounter (Signed)
requesting a refill on alprazolam 1 mg pt was last seen on  10-09-14 next appt  11-13-14.

## 2014-11-04 NOTE — Telephone Encounter (Signed)
faxed rx- confirmed

## 2014-11-04 NOTE — Telephone Encounter (Signed)
Will order alprazolam on milligram, #30 with no refills. Patient does have follow-up appointment on 11/13/2014.

## 2014-11-13 ENCOUNTER — Ambulatory Visit (INDEPENDENT_AMBULATORY_CARE_PROVIDER_SITE_OTHER): Payer: Medicare Other | Admitting: Psychiatry

## 2014-11-13 ENCOUNTER — Encounter: Payer: Self-pay | Admitting: Psychiatry

## 2014-11-13 VITALS — BP 124/82 | HR 84 | Temp 98.3°F | Ht 67.0 in | Wt 209.6 lb

## 2014-11-13 DIAGNOSIS — F410 Panic disorder [episodic paroxysmal anxiety] without agoraphobia: Secondary | ICD-10-CM | POA: Diagnosis not present

## 2014-11-13 MED ORDER — TRAZODONE HCL 50 MG PO TABS: ORAL_TABLET | ORAL | Status: DC

## 2014-11-13 MED ORDER — CLONAZEPAM 1 MG PO TABS: 1.0000 mg | ORAL_TABLET | Freq: Three times a day (TID) | ORAL | Status: DC

## 2014-11-13 MED ORDER — DULOXETINE HCL 60 MG PO CPEP: 120.0000 mg | ORAL_CAPSULE | Freq: Every day | ORAL | Status: DC

## 2014-11-13 MED ORDER — BUPROPION HCL ER (XL) 300 MG PO TB24: 300.0000 mg | ORAL_TABLET | ORAL | Status: DC

## 2014-11-13 NOTE — Progress Notes (Signed)
BH MD/PA/NP OP Progress Note  11/13/2014 12:49 PM [name]  MRN:  [id number]  Subjective:  Patient returns for follow-up for major depressive disorder and panic disorder. At the last visit we continued her on her Cymbalta which she now states she is always been taking 120 mg at bedtime, Klonopin 1 mg 3 times a day and her alprazolam 1 mg daily as needed for panic attacks. She is also receiving Ambien 5 mg at bedtime. She indicates that at the last visit we added Wellbutrin she states that to some extent her episodes decrease. States her episodes would consist of crying being depressed and anxious. She states that now she gets them maybe twice a week since adding the Wellbutrin.   Chief Complaint:  Chief Complaint    Follow-up; Medication Refill; Anxiety; Depression; Medication Problem; Insomnia; Panic Attack     Visit Diagnosis:     ICD-9-CM ICD-10-CM   1. Panic disorder 300.01 F41.0     Past Medical History:  Past Medical History  Diagnosis Date  . Anxiety   . PTSD (post-traumatic stress disorder)   . Depression   . Diabetes mellitus, type II     Past Surgical History  Procedure Laterality Date  . Gastric bypass    . Cholecystectomy    . Hernia repair     Family History:  Family History  Problem Relation Age of Onset  . Arthritis/Rheumatoid Mother   . Heart block Mother   . Clotting disorder Mother   . Osteoporosis Mother   . Heart attack Mother   . Anxiety disorder Mother   . Depression Mother   . Heart disease Father   . Heart attack Father   . Depression Sister   . Anxiety disorder Sister    Social History:  Social History   Social History  . Marital Status: Married    Spouse Name: N/A  . Number of Children: N/A  . Years of Education: N/A   Social History Main Topics  . Smoking status: Never Smoker   . Smokeless tobacco: Never Used  . Alcohol Use: No  . Drug Use: No  . Sexual Activity: Yes    Birth Control/ Protection: Injection   Other  Topics Concern  . None   Social History Narrative   Additional History:   Assessment:   Musculoskeletal: Strength & Muscle Tone: within normal limits Gait & Station: normal Patient leans: N/A  Psychiatric Specialty Exam: HPI  Review of Systems  Psychiatric/Behavioral: Positive for depression. Negative for suicidal ideas, hallucinations, memory loss and substance abuse. The patient is nervous/anxious and has insomnia.     Blood pressure 124/82, pulse 84, temperature 98.3 F (36.8 C), temperature source Tympanic, height 5\' 7"  (1.702 m), weight 209 lb 9.6 oz (95.074 kg), SpO2 98 %.Body mass index is 32.82 kg/(m^2).  General Appearance: Well Groomed  Eye Contact:  Good  Speech:  Normal Rate  Volume:  Normal  Mood:  A little better  Affect:  Constricted  Thought Process:  Linear and Logical  Orientation:  Full (Time, Place, and Person)  Thought Content:  Negative  Suicidal Thoughts:  No  Homicidal Thoughts:  No  Memory:  Immediate;   Good Recent;   Good Remote;   Good  Judgement:  Good  Insight:  Good  Psychomotor Activity:  Negative  Concentration:  Good  Recall:  Good  Fund of Knowledge: Good  Language: Good  Akathisia:  Negative  Handed:  Rightunknown  AIMS (if indicated):  N/A  Assets:  Communication Skills Desire for Improvement Social Support  ADL's:  Intact  Cognition: WNL  Sleep:  Good with Lorrin Mais   Is the patient at risk to self?  No. Has the patient been a risk to self in the past 6 months?  No. Has the patient been a risk to self within the distant past?  No. Is the patient a risk to others?  No. Has the patient been a risk to others in the past 6 months?  No. Has the patient been a risk to others within the distant past?  No.  Current Medications: Current Outpatient Prescriptions  Medication Sig Dispense Refill  . ACCU-CHEK AVIVA PLUS test strip USE TO CHECK BLOOD SUGAR 4 TIMES A DAY 024.419  4  . ACCU-CHEK SOFTCLIX LANCETS lancets     .  acetaminophen (TYLENOL) 325 MG tablet     . albuterol (PROAIR HFA) 108 (90 BASE) MCG/ACT inhaler     . ALPRAZolam (XANAX) 1 MG tablet Take 1 tablet (1 mg total) by mouth daily as needed for anxiety. 30 tablet 0  . buPROPion (WELLBUTRIN XL) 300 MG 24 hr tablet Take 1 tablet (300 mg total) by mouth every morning. 30 tablet 1  . calcium carbonate (OS-CAL - DOSED IN MG OF ELEMENTAL CALCIUM) 1250 (500 CA) MG tablet Frequency:PHARMDIR   Dosage:0.0     Instructions:  Note:4 days a week Dose: 50000 INT UNITS    . cefdinir (OMNICEF) 300 MG capsule Take 600 mg by mouth daily.  0  . clonazePAM (KLONOPIN) 1 MG tablet Take 1 tablet (1 mg total) by mouth 3 (three) times daily. 90 tablet 1  . docusate sodium (COLACE) 100 MG capsule Take by mouth.    . DULoxetine (CYMBALTA) 60 MG capsule Take 2 capsules (120 mg total) by mouth daily. 60 capsule 2  . folic acid (FOLVITE) 1 MG tablet Take 3 mg by mouth daily.  12  . GLUCAGON EMERGENCY 1 MG injection INJECT 1 MG INTO THE MUSCLE ONCE AS NEEDED FOR LOW BLOOD SUGAR.  11  . HYDROcodone-acetaminophen (NORCO) 7.5-325 MG per tablet TAKE 1 TABLET BY MOUTH EVERY 4 HOURS AS NEEDED FOR PAIN/ MAX OF 5 TABS DAILY  0  . HYDROcodone-acetaminophen (NORCO/VICODIN) 5-325 MG tablet Take 1 tablet by mouth every 6 (six) hours as needed. for pain  0  . Insulin Syringe-Needle U-100 (INSULIN SYRINGE .5CC/31GX5/16") 31G X 5/16" 0.5 ML MISC     . medroxyPROGESTERone (DEPO-PROVERA) 150 MG/ML injection See admin instructions.  0  . pantoprazole (PROTONIX) 40 MG tablet TAKE 1 TABLET (40 MG TOTAL) BY MOUTH EVERY 12 (TWELVE) HOURS.  8  . Prenatal Vit-Fe Fumarate-FA (PRENATAL 19) 29-1 MG CHEW Chew by mouth.    . sucralfate (CARAFATE) 1 G tablet TAKE 1 TABLET (1 G TOTAL) BY MOUTH 4 (FOUR) TIMES DAILY BEFORE MEALS AND NIGHTLY.  7  . temazepam (RESTORIL) 30 MG capsule Take 1 capsule (30 mg total) by mouth at bedtime as needed for sleep. 30 capsule 0  . Thiamine Mononitrate (HM VITAMIN B1) 100 MG TABS  Take 100 mg by mouth.    . Vitamin D, Ergocalciferol, (DRISDOL) 50000 UNITS CAPS capsule TAKE ONE CAPSULE BY MOUTH TWICE A WEEK  0  . traZODone (DESYREL) 50 MG tablet Take one to two tablet at bedtime as needed for sleep. 60 tablet 1   No current facility-administered medications for this visit.    Medical Decision Making:  Established Problem, Stable/Improving (1), Review of Medication Regimen &  Side Effects (2) and Review of New Medication or Change in Dosage (2)  Treatment Plan Summary:Medication management and Plan Major depressive disorder, recurrent, moderate-we will continue the patient on her Cymbalta 120 mg daily. We will increase her Wellbutrin XL from 150 mg in the morning to 300 mg in the morning. She indicates that the Ambien has only been helpful for about 3 hours of sleep. Thus we'll discontinue it at this time and try some trazodone 50-100 mg at bedtime as needed for insomnia.   Panic disorder without or phobia. We will continue the patient's Klonopin 1 mg 3 times a day as needed. She will continue on her alprazolam 1 mg daily as needed.  Asian will follow up in 1 month. She's been encouraged call any questions or concerns prior to her next appointment.   Faith Rogue 11/13/2014, 12:49 PM

## 2014-11-27 ENCOUNTER — Other Ambulatory Visit: Payer: Self-pay

## 2014-11-27 ENCOUNTER — Emergency Department: Payer: Medicare Other

## 2014-11-27 ENCOUNTER — Encounter: Payer: Self-pay | Admitting: Emergency Medicine

## 2014-11-27 ENCOUNTER — Emergency Department
Admission: EM | Admit: 2014-11-27 | Discharge: 2014-11-27 | Disposition: A | Discharge status: Home / Self Care | Payer: Medicare Other | Attending: Emergency Medicine | Admitting: Emergency Medicine

## 2014-11-27 DIAGNOSIS — E119 Type 2 diabetes mellitus without complications: Secondary | ICD-10-CM | POA: Diagnosis not present

## 2014-11-27 DIAGNOSIS — R1084 Generalized abdominal pain: Secondary | ICD-10-CM | POA: Diagnosis present

## 2014-11-27 DIAGNOSIS — K5900 Constipation, unspecified: Secondary | ICD-10-CM | POA: Diagnosis not present

## 2014-11-27 DIAGNOSIS — Z880 Allergy status to penicillin: Secondary | ICD-10-CM | POA: Diagnosis not present

## 2014-11-27 DIAGNOSIS — Z79899 Other long term (current) drug therapy: Secondary | ICD-10-CM | POA: Insufficient documentation

## 2014-11-27 LAB — COMPREHENSIVE METABOLIC PANEL
ALT: 15 U/L (ref 14–54)
AST: 16 U/L (ref 15–41)
Albumin: 3.7 g/dL (ref 3.5–5.0)
Alkaline Phosphatase: 24 U/L — ABNORMAL LOW (ref 38–126)
Anion gap: 4 — ABNORMAL LOW (ref 5–15)
BUN: 14 mg/dL (ref 6–20)
CO2: 24 mmol/L (ref 22–32)
Calcium: 8.2 mg/dL — ABNORMAL LOW (ref 8.9–10.3)
Chloride: 109 mmol/L (ref 101–111)
Creatinine, Ser: 0.53 mg/dL (ref 0.44–1.00)
GFR calc Af Amer: >60 mL/min (ref >60)
GFR calc non Af Amer: >60 mL/min (ref >60)
Glucose, Bld: 99 mg/dL (ref 65–99)
Potassium: 3.6 mmol/L (ref 3.5–5.1)
Sodium: 137 mmol/L (ref 135–145)
Total Bilirubin: 0.5 mg/dL (ref 0.3–1.2)
Total Protein: 6.7 g/dL (ref 6.5–8.1)

## 2014-11-27 LAB — CBC
HCT: 36.9 % (ref 35.0–47.0)
Hemoglobin: 12 g/dL (ref 12.0–16.0)
MCH: 29.6 pg (ref 26.0–34.0)
MCHC: 32.7 g/dL (ref 32.0–36.0)
MCV: 90.6 fL (ref 80.0–100.0)
Platelets: 300 10*3/uL (ref 150–440)
RBC: 4.07 MIL/uL (ref 3.80–5.20)
RDW: 12.3 % (ref 11.5–14.5)
WBC: 6.9 10*3/uL (ref 3.6–11.0)

## 2014-11-27 LAB — LIPASE, BLOOD: Lipase: 30 U/L (ref 22–51)

## 2014-11-27 MED ORDER — METOCLOPRAMIDE HCL 5 MG/ML IJ SOLN
INTRAMUSCULAR | Status: AC
  Administered 2014-11-27: 10 mg via INTRAVENOUS
  Filled 2014-11-27: qty 2

## 2014-11-27 MED ORDER — METOCLOPRAMIDE HCL 5 MG/ML IJ SOLN
10.0000 mg | Freq: Once | INTRAMUSCULAR | Status: AC
  Administered 2014-11-27: 10 mg via INTRAVENOUS

## 2014-11-27 MED ORDER — FENTANYL CITRATE (PF) 100 MCG/2ML IJ SOLN
25.0000 ug | Freq: Once | INTRAMUSCULAR | Status: AC
  Administered 2014-11-27: 25 ug via INTRAVENOUS
  Filled 2014-11-27: qty 2

## 2014-11-27 MED ORDER — IOHEXOL 300 MG/ML  SOLN
100.0000 mL | Freq: Once | INTRAMUSCULAR | Status: AC | PRN
  Administered 2014-11-27: 100 mL via INTRAVENOUS

## 2014-11-27 MED ORDER — ONDANSETRON HCL 4 MG/2ML IJ SOLN
4.0000 mg | Freq: Once | INTRAMUSCULAR | Status: AC
  Administered 2014-11-27: 4 mg via INTRAVENOUS
  Filled 2014-11-27: qty 2

## 2014-11-27 MED ORDER — IOHEXOL 240 MG/ML SOLN
25.0000 mL | Freq: Once | INTRAMUSCULAR | Status: AC | PRN
  Administered 2014-11-27: 25 mL via ORAL

## 2014-11-27 MED ORDER — SENNOSIDES-DOCUSATE SODIUM 8.6-50 MG PO TABS
2.0000 | ORAL_TABLET | Freq: Once | ORAL | Status: AC
  Administered 2014-11-27: 2 via ORAL
  Filled 2014-11-27: qty 2

## 2014-11-27 NOTE — ED Notes (Signed)

## 2014-11-27 NOTE — ED Notes (Addendum)
Pt presents to ED via EMS from personal home with c/o of abdominal pain. EMS states pt has been diagnosed with a hiatal and umbilical hernia approximately x3 months ago, needing to be surgically repaired after birth delivery. EMS reports pt has never scheduled an appointment for these presenting sx and is currently experiencing constant pain to affected regions. EMS states pt has not had a bowel movement in x4 days. EMS also reports pt has had a total of x8 surgeries for a surgery complication of a ROU-EN-Y surgery.

## 2014-11-27 NOTE — ED Provider Notes (Signed)
[facility] Emergency Department Provider Note  ____________________________________________  Time seen: 3:15 AM  I have reviewed the triage vital signs and the nursing notes.   HISTORY  Chief Complaint Abdominal Pain      HPI [name] is a 39 y.o. female resents with 10 out of 10 generalized abdominal discomfort with acute onset approximately 2 hours before presentation to the emergency department. Patient states that the pain is accompanied by nausea however no vomiting. Of note patient has a history of multiple abdominal surgeries since 2009. 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 Of note patient states that she has not had a bowel movement in 4 days.     Past Medical History  Diagnosis Date  . Anxiety   . PTSD (post-traumatic stress disorder)   . Depression   . Diabetes mellitus, type II [facility])     Patient Active Problem List   Diagnosis Date Noted  . Abnormal ECG 10/09/2014  . Achilles tendinitis 10/09/2014  . Anxiety 10/09/2014  . DDD (degenerative disc disease), lumbar 10/09/2014  . Clinical depression 10/09/2014  . Class 1 obesity 10/09/2014  . Family planning 06/06/2014  . Fibroid 06/06/2014  . Bariatric surgery status 06/06/2014  . Avitaminosis D 04/22/2014  . Achilles bursitis 04/12/2014  . DDD (degenerative disc disease), lumbosacral 04/12/2014  . LBP (low back pain) 04/12/2014  . Abdominal pain, right upper quadrant 04/12/2014  . Diabetes mellitus arising in pregnancy 04/01/2014  . Personal history of surgery to heart and great vessels, presenting hazards to health 04/01/2014  . Episode of syncope 02/04/2014  . Breathlessness on exertion 02/04/2014  . Awareness of heartbeats 12/11/2013  . Cardiomyopathy ([place]) 10/29/2013  . Dizziness 10/29/2013  . Mixed incontinence 08/31/2012  .  Adiposity 08/31/2012  . Chronic pain associated with significant psychosocial dysfunction 05/12/2012  . Disorder of sacrum 05/12/2012  . Arthropathy of lumbar facet joint 05/12/2012  . Lumbar and sacral osteoarthritis 05/12/2012  . Arthralgia, sacroiliac 05/12/2012    Past Surgical History  Procedure Laterality Date  . Gastric bypass    . Cholecystectomy    . Hernia repair      Current Outpatient Rx  Name  Route  Sig  Dispense  Refill  . ACCU-CHEK AVIVA PLUS test strip      USE TO CHECK BLOOD SUGAR 4 TIMES A DAY 024.419      4     Dispense as written.   [name] ACCU-CHEK SOFTCLIX LANCETS lancets               . acetaminophen (TYLENOL) 325 MG tablet               . albuterol (PROAIR HFA) 108 (90 BASE) MCG/ACT inhaler               . ALPRAZolam (XANAX) 1 MG tablet   Oral   Take 1 tablet (1 mg total) by mouth daily as needed for anxiety.   30 tablet   0   . buPROPion (WELLBUTRIN XL) 300 MG 24 hr tablet   Oral   Take 1 tablet (300 mg total) by mouth every morning.   30 tablet   1   . calcium carbonate (OS-CAL - DOSED IN MG OF ELEMENTAL CALCIUM) 1250 (500 CA) MG tablet      Frequency:PHARMDIR   Dosage:0.0     Instructions:  Note:4 days a week Dose: 50000 INT UNITS         . cefdinir (OMNICEF) 300 MG capsule   Oral   Take 600 mg by mouth daily.      0   . clonazePAM (KLONOPIN) 1 MG tablet   Oral   Take 1 tablet (1 mg total) by mouth 3 (three) times daily.   90 tablet   1   . docusate sodium (COLACE) 100 MG capsule   Oral   Take by mouth.         . DULoxetine (CYMBALTA) 60 MG capsule   Oral   Take 2 capsules (120 mg total) by mouth daily.   60 capsule   2   . folic acid (FOLVITE) 1 MG tablet   Oral   Take 3 mg by mouth daily.      12   . GLUCAGON EMERGENCY 1 MG injection      INJECT 1 MG INTO THE MUSCLE ONCE AS NEEDED FOR LOW BLOOD SUGAR.      11     Dispense as written.   [name] HYDROcodone-acetaminophen (NORCO/VICODIN) 5-325 MG  tablet   Oral   Take 1 tablet by mouth every 6 (six) hours as needed. for pain      0   . Insulin Syringe-Needle U-100 (INSULIN SYRINGE .5CC/31GX5/16") 31G X 5/16" 0.5 ML MISC               . medroxyPROGESTERone (DEPO-PROVERA) 150 MG/ML injection      See admin instructions.      0   . pantoprazole (PROTONIX) 40 MG tablet      TAKE 1 TABLET (40 MG TOTAL) BY MOUTH EVERY 12 (TWELVE) HOURS.      8   . sucralfate (CARAFATE) 1 G tablet      TAKE 1 TABLET (1 G TOTAL) BY MOUTH 4 (FOUR) TIMES DAILY BEFORE MEALS AND NIGHTLY.      7   . Thiamine Mononitrate (HM VITAMIN B1) 100 MG TABS   Oral   Take 100 mg by mouth.         . traZODone (DESYREL) 50 MG tablet      Take one to two tablet at bedtime as needed for sleep.   60 tablet   1   . Vitamin D, Ergocalciferol, (DRISDOL) 50000 UNITS CAPS capsule      TAKE ONE CAPSULE BY MOUTH TWICE A WEEK      0   . zolpidem (AMBIEN) 5 MG tablet   Oral   Take 5 mg by mouth at bedtime as needed for sleep.         [name] HYDROcodone-acetaminophen (NORCO) 7.5-325 MG per tablet      TAKE 1 TABLET BY MOUTH EVERY 4 HOURS AS NEEDED FOR PAIN/ MAX OF 5 TABS DAILY      0   . Prenatal Vit-Fe Fumarate-FA (PRENATAL 19) 29-1 MG CHEW   Oral   Chew by mouth.         . temazepam (RESTORIL) 30 MG capsule   Oral   Take 1 capsule (30 mg total) by mouth at bedtime as needed for sleep.   30 capsule   0     Allergies Amoxicillin; Penicillins; Morphine; and Sulfa antibiotics  Family History  Problem Relation Age of Onset  . Arthritis/Rheumatoid Mother   . Heart block Mother   . Clotting disorder Mother   . Osteoporosis Mother   . Heart attack Mother   .  Anxiety disorder Mother   . Depression Mother   . Heart disease Father   . Heart attack Father   . Depression Sister   . Anxiety disorder Sister     Social History Social History  Substance Use Topics  . Smoking status: Never Smoker   . Smokeless tobacco: Never Used  .  Alcohol Use: No    Review of Systems  Constitutional: Negative for fever. Eyes: Negative for visual changes. ENT: Negative for sore throat. Cardiovascular: Negative for chest pain. Respiratory: Negative for shortness of breath. Gastrointestinal: Positive for abdominal pain and nausea Genitourinary: Negative for dysuria. Musculoskeletal: Negative for back pain. Skin: Negative for rash. Neurological: Negative for headaches, focal weakness or numbness.  10-point ROS otherwise negative.  ____________________________________________   PHYSICAL EXAM:  VITAL SIGNS: ED Triage Vitals  Enc Vitals Group     BP 11/27/14 0223 116/69 mmHg     Pulse Rate 11/27/14 0223 96     Resp 11/27/14 0223 20     Temp 11/27/14 0223 98 F (36.7 C)     Temp Source 11/27/14 0223 Oral     SpO2 11/27/14 0223 99 %     Weight 11/27/14 0223 206 lb (93.441 kg)     Height 11/27/14 0223 5\' 7"  (1.702 m)     Head Cir --      Peak Flow --      Pain Score 11/27/14 0225 8     Pain Loc --      Pain Edu? --      Excl. in [place]? --      Constitutional: Alert and oriented. Well appearing and in no distress. Eyes: Conjunctivae are normal. PERRL. Normal extraocular movements. ENT   Head: Normocephalic and atraumatic.   Nose: No congestion/rhinnorhea.   Mouth/Throat: Mucous membranes are moist.   Neck: No stridor. Hematological/Lymphatic/Immunilogical: No cervical lymphadenopathy. Cardiovascular: Normal rate, regular rhythm. Normal and symmetric distal pulses are present in all extremities. No murmurs, rubs, or gallops. Respiratory: Normal respiratory effort without tachypnea nor retractions. Breath sounds are clear and equal bilaterally. No wheezes/rales/rhonchi. Gastrointestinal: LUQ/LLQ pain No distention. There is no CVA tenderness. Genitourinary: deferred Musculoskeletal: Nontender with normal range of motion in all extremities. No joint effusions.  No lower extremity tenderness nor  edema. Neurologic:  Normal speech and language. No gross focal neurologic deficits are appreciated. Speech is normal.  Skin:  Skin is warm, dry and intact. No rash noted. Psychiatric: Mood and affect are normal. Speech and behavior are normal. Patient exhibits appropriate insight and judgment.  ____________________________________________    LABS (pertinent positives/negatives)  Labs Reviewed  COMPREHENSIVE METABOLIC PANEL - Abnormal; Notable for the following:    Calcium 8.2 (*)    Alkaline Phosphatase 24 (*)    Anion gap 4 (*)    All other components within normal limits  LIPASE, BLOOD  CBC  URINALYSIS COMPLETEWITH MICROSCOPIC ([facility] ONLY)     RADIOLOGY  CT Abdomen Pelvis W Contrast (Final result) Result time: 11/27/14 05:57:06   Final result by Rad Results In Interface (11/27/14 05:57:06)   Narrative:   CLINICAL DATA: Abdominal pain. History of hiatal hernia and umbilical hernia about 3 months ago. Now with constant pain to the affected regions. Multiple previous abdominal surgeries.  EXAM: CT ABDOMEN AND PELVIS WITH CONTRAST  TECHNIQUE: Multidetector CT imaging of the abdomen and pelvis was performed using the standard protocol following bolus administration of intravenous contrast.  CONTRAST: 165mL OMNIPAQUE IOHEXOL 300 MG/ML SOLN  COMPARISON: 06/22/2013  FINDINGS: The  lung bases are clear.  Surgical absence of the gallbladder. No bile duct dilatation. The liver, spleen, pancreas, adrenal glands, kidneys, abdominal aorta, inferior vena cava, and retroperitoneal lymph nodes are unremarkable. Postoperative changes consistent with gastric bypass. Stomach, small bowel, and colon are not abnormally distended. Gas and stool filled colon. No free air or free fluid in the abdomen. Small periumbilical and umbilical hernias containing fat.  Pelvis: Appendix is normal. Uterus and ovaries are not enlarged. Uterus is retroflexed. No free or loculated pelvic  fluid collections. Bladder is decompressed without evidence of significant wall thickening. No pelvic mass or lymphadenopathy. Degenerative disc disease at the lumbosacral interspace. No destructive bone lesions.  IMPRESSION: No acute process demonstrated in the abdomen or pelvis. Small umbilical and periumbilical hernias containing fat. No evidence of bowel obstruction or inflammation.   Electronically Signed By: Lucienne Capers M.D. On: 11/27/2014 05:57        INITIAL IMPRESSION / ASSESSMENT AND PLAN / ED COURSE  Pertinent labs & imaging results that were available during my care of the patient were reviewed by me and considered in my medical decision making (see chart for details).    ____________________________________________   FINAL CLINICAL IMPRESSION(S) / ED DIAGNOSES  Final diagnoses:  Constipation, unspecified constipation type      Gregor Hams, MD 11/27/14 [phone]

## 2014-11-27 NOTE — Discharge Instructions (Signed)

## 2014-11-27 NOTE — ED Notes (Signed)
Patient calling out reporting that she is unable to drink contrast secondary to continued volume. Patient observed actively trying to sip on contrast volume. MD made aware. VORB for Reglan 10mg  IVP now. Order to be entered and carried by this RN.

## 2014-12-01 ENCOUNTER — Other Ambulatory Visit: Payer: Self-pay | Admitting: Psychiatry

## 2014-12-03 ENCOUNTER — Other Ambulatory Visit: Payer: Self-pay | Admitting: Psychiatry

## 2014-12-18 ENCOUNTER — Ambulatory Visit (INDEPENDENT_AMBULATORY_CARE_PROVIDER_SITE_OTHER): Payer: Medicare Other | Admitting: Psychiatry

## 2014-12-18 ENCOUNTER — Encounter: Payer: Self-pay | Admitting: Psychiatry

## 2014-12-18 VITALS — BP 122/80 | HR 102 | Temp 97.4°F | Ht 67.0 in | Wt 205.6 lb

## 2014-12-18 DIAGNOSIS — F410 Panic disorder [episodic paroxysmal anxiety] without agoraphobia: Secondary | ICD-10-CM

## 2014-12-18 DIAGNOSIS — F331 Major depressive disorder, recurrent, moderate: Secondary | ICD-10-CM | POA: Diagnosis not present

## 2014-12-18 DIAGNOSIS — M5136 Other intervertebral disc degeneration, lumbar region: Secondary | ICD-10-CM | POA: Insufficient documentation

## 2014-12-18 MED ORDER — ALPRAZOLAM 1 MG PO TABS: 1.0000 mg | ORAL_TABLET | Freq: Every day | ORAL | Status: DC | PRN

## 2014-12-18 MED ORDER — CLONAZEPAM 1 MG PO TABS: 1.0000 mg | ORAL_TABLET | Freq: Three times a day (TID) | ORAL | Status: DC

## 2014-12-18 MED ORDER — BUPROPION HCL ER (XL) 450 MG PO TB24: 450.0000 mg | ORAL_TABLET | ORAL | Status: DC

## 2014-12-18 MED ORDER — ZOLPIDEM TARTRATE 10 MG PO TABS: 10.0000 mg | ORAL_TABLET | Freq: Every evening | ORAL | Status: DC | PRN

## 2014-12-18 NOTE — Progress Notes (Signed)
BH MD/PA/NP OP Progress Note  12/18/2014 10:38 AM [name]  MRN:  [id number]  Subjective:  Patient returns for follow-up for major depressive disorder and panic disorder. At the last visit we continued her on her Cymbalta which she now states she is always been taking 120 mg at bedtime, Klonopin 1 mg 3 times a day and her alprazolam 1 mg daily as needed for panic attacks. We increased her Wellbutrin XL from 150 mg 300 mg daily. Patient states she felt like this improved her mood for about 3 weeks but then she feels like she's now returned to normal. I discussed that perhaps she could push the dose further and that depending on her response to that we might need to then change out a medication. She was in agreement with that plan particularly because she got a response from the higher dose of Wellbutrin for a few weeks.  She relates that the trazodone did help her insomnia but she had daytime sedation and she prefers the Ambien because it did give her 4 hours of sleep and no daytime sedation.  States that she seeing her primary care physician was prescribing some opioid pain medication and told her to asked this Probation officer about my opinion of her taking that along with these medications. I informed ideally would be better if she could be on some other type of pain medication because of the properties of both benzodiazepines and opioids.   He presents to the appointment with her infant daughter and was playful with her daughter throughout the session. Chief Complaint: Depression Chief Complaint    Follow-up; Medication Refill     Visit Diagnosis:   No diagnosis found.  Past Medical History:  Past Medical History  Diagnosis Date  . Anxiety   . PTSD (post-traumatic stress disorder)   . Depression   . Diabetes mellitus, type II ([place])   . Seizures ([place])   . Headache     Past Surgical History  Procedure Laterality Date  . Gastric bypass    . Cholecystectomy    . Hernia repair      Family History:  Family History  Problem Relation Age of Onset  . Arthritis/Rheumatoid Mother   . Heart block Mother   . Clotting disorder Mother   . Osteoporosis Mother   . Heart attack Mother   . Anxiety disorder Mother   . Depression Mother   . Heart disease Father   . Heart attack Father   . Depression Sister   . Anxiety disorder Sister    Social History:  Social History   Social History  . Marital Status: Married    Spouse Name: N/A  . Number of Children: N/A  . Years of Education: N/A   Social History Main Topics  . Smoking status: Never Smoker   . Smokeless tobacco: Never Used  . Alcohol Use: No  . Drug Use: No  . Sexual Activity: Yes    Birth Control/ Protection: Injection   Other Topics Concern  . None   Social History Narrative   Additional History:   Assessment:   Musculoskeletal: Strength & Muscle Tone: within normal limits Gait & Station: normal Patient leans: N/A  Psychiatric Specialty Exam: HPI  Review of Systems  Psychiatric/Behavioral: Positive for depression. Negative for suicidal ideas, hallucinations, memory loss and substance abuse. The patient has insomnia. The patient is not nervous/anxious.   All other systems reviewed and are negative.   Blood pressure 122/80, pulse 102, temperature 97.4 F (36.3  C), temperature source Tympanic, height 5\' 7"  (1.702 m), weight 205 lb 9.6 oz (93.26 kg), SpO2 98 %.Body mass index is 32.19 kg/(m^2).  General Appearance: Well Groomed  Eye Contact:  Good  Speech:  Normal Rate  Volume:  Normal  Mood:  Depressed  Affect:  Constricted  Thought Process:  Linear and Logical  Orientation:  Full (Time, Place, and Person)  Thought Content:  Negative  Suicidal Thoughts:  No  Homicidal Thoughts:  No  Memory:  Immediate;   Good Recent;   Good Remote;   Good  Judgement:  Good  Insight:  Good  Psychomotor Activity:  Negative  Concentration:  Good  Recall:  Good  Fund of Knowledge: Good  Language:  Good  Akathisia:  Negative  Handed:  Rightunknown  AIMS (if indicated):  N/A  Assets:  Communication Skills Desire for Improvement Social Support  ADL's:  Intact  Cognition: WNL  Sleep:  Good with Ambien   Is the patient at risk to self?  No. Has the patient been a risk to self in the past 6 months?  No. Has the patient been a risk to self within the distant past?  No. Is the patient a risk to others?  No. Has the patient been a risk to others in the past 6 months?  No. Has the patient been a risk to others within the distant past?  No.  Current Medications: Current Outpatient Prescriptions  Medication Sig Dispense Refill  . ACCU-CHEK AVIVA PLUS test strip USE TO CHECK BLOOD SUGAR 4 TIMES A DAY 024.419  4  . ACCU-CHEK SOFTCLIX LANCETS lancets     . acetaminophen (TYLENOL) 325 MG tablet     . albuterol (PROAIR HFA) 108 (90 BASE) MCG/ACT inhaler     . ALPRAZolam (XANAX) 1 MG tablet TAKE 1 TABLET BY MOUTH EVERY DAY AS NEEDED FOR ANXIETY 30 tablet 0  . buPROPion (WELLBUTRIN XL) 300 MG 24 hr tablet Take 1 tablet (300 mg total) by mouth every morning. 30 tablet 1  . calcium carbonate (OS-CAL - DOSED IN MG OF ELEMENTAL CALCIUM) 1250 (500 CA) MG tablet Frequency:PHARMDIR   Dosage:0.0     Instructions:  Note:4 days a week Dose: 50000 INT UNITS    . clonazePAM (KLONOPIN) 1 MG tablet TAKE 1 TABLET BY MOUTH THREE TIMES DAILY 90 tablet 0  . DULoxetine (CYMBALTA) 60 MG capsule Take 2 capsules (120 mg total) by mouth daily. 60 capsule 2  . folic acid (FOLVITE) 1 MG tablet Take 3 mg by mouth daily.  12  . GLUCAGON EMERGENCY 1 MG injection INJECT 1 MG INTO THE MUSCLE ONCE AS NEEDED FOR LOW BLOOD SUGAR.  11  . HYDROcodone-acetaminophen (NORCO/VICODIN) 5-325 MG tablet Take 1 tablet by mouth every 6 (six) hours as needed. for pain  0  . Insulin Syringe-Needle U-100 (INSULIN SYRINGE .5CC/31GX5/16") 31G X 5/16" 0.5 ML MISC     . medroxyPROGESTERone (DEPO-PROVERA) 150 MG/ML injection See admin  instructions.  0  . pantoprazole (PROTONIX) 40 MG tablet TAKE 1 TABLET (40 MG TOTAL) BY MOUTH EVERY 12 (TWELVE) HOURS.  8  . Prenatal Vit-Fe Fumarate-FA (PRENATAL 19) 29-1 MG CHEW Chew by mouth.    . sucralfate (CARAFATE) 1 G tablet TAKE 1 TABLET (1 G TOTAL) BY MOUTH 4 (FOUR) TIMES DAILY BEFORE MEALS AND NIGHTLY.  7  . temazepam (RESTORIL) 30 MG capsule Take 1 capsule (30 mg total) by mouth at bedtime as needed for sleep. 30 capsule 0  . Thiamine Mononitrate (  HM VITAMIN B1) 100 MG TABS Take 100 mg by mouth.    . topiramate (TOPAMAX) 25 MG tablet Take by mouth.    . Vitamin D, Ergocalciferol, (DRISDOL) 50000 UNITS CAPS capsule TAKE ONE CAPSULE BY MOUTH TWICE A WEEK  0   No current facility-administered medications for this visit.    Medical Decision Making:  Established Problem, Stable/Improving (1), Review of Medication Regimen & Side Effects (2) and Review of New Medication or Change in Dosage (2)  Treatment Plan Summary:Medication management and Plan   Major depressive disorder, recurrent, moderate-we will continue the patient on her Cymbalta 120 mg daily. We will increase her Wellbutrin XL from 300 mg in the morning to 450 mg in the morning. She indicates that the Ambien has only been helpful for about 3 hours of sleep.  Insomnia- Discontinue trazodone due to daytime sedation. Restart Ambien as patient prefers this as it has no daytime sedation.  Panic disorder without or phobia. We will continue the patient's Klonopin 1 mg 3 times a day as needed. She will continue on her alprazolam 1 mg daily as needed.  Patient will follow up in 1 month. She's been encouraged call any questions or concerns prior to her next appointment.   Faith Rogue 12/18/2014, 10:38 AM

## 2014-12-23 ENCOUNTER — Telehealth: Payer: Self-pay

## 2014-12-23 NOTE — Telephone Encounter (Signed)
received a fax requester that prior Josem Kaufmann was needed for forivo xl 450 mg

## 2014-12-23 NOTE — Telephone Encounter (Signed)
received notice from [employer] that pa was approved until 02-15-15

## 2014-12-23 NOTE — Telephone Encounter (Signed)
faxed prior authorization back to [facility] with approval notice

## 2014-12-23 NOTE — Telephone Encounter (Signed)
went online to covermy meds and filled out a request for PA

## 2015-01-03 ENCOUNTER — Other Ambulatory Visit: Payer: Self-pay | Admitting: *Deleted

## 2015-01-03 DIAGNOSIS — D649 Anemia, unspecified: Secondary | ICD-10-CM

## 2015-01-15 ENCOUNTER — Inpatient Hospital Stay: Payer: Medicare Other | Attending: Oncology

## 2015-01-15 ENCOUNTER — Ambulatory Visit: Payer: Medicare Other | Admitting: Psychiatry

## 2015-01-15 ENCOUNTER — Inpatient Hospital Stay (HOSPITAL_BASED_OUTPATIENT_CLINIC_OR_DEPARTMENT_OTHER): Payer: Medicare Other | Admitting: Oncology

## 2015-01-15 ENCOUNTER — Encounter: Payer: Self-pay | Admitting: Oncology

## 2015-01-15 VITALS — BP 118/79 | HR 78 | Temp 99.2°F | Wt 200.2 lb

## 2015-01-15 DIAGNOSIS — E119 Type 2 diabetes mellitus without complications: Secondary | ICD-10-CM | POA: Insufficient documentation

## 2015-01-15 DIAGNOSIS — F329 Major depressive disorder, single episode, unspecified: Secondary | ICD-10-CM | POA: Diagnosis not present

## 2015-01-15 DIAGNOSIS — Z9884 Bariatric surgery status: Secondary | ICD-10-CM

## 2015-01-15 DIAGNOSIS — D509 Iron deficiency anemia, unspecified: Secondary | ICD-10-CM | POA: Insufficient documentation

## 2015-01-15 DIAGNOSIS — M069 Rheumatoid arthritis, unspecified: Secondary | ICD-10-CM

## 2015-01-15 DIAGNOSIS — F4310 Post-traumatic stress disorder, unspecified: Secondary | ICD-10-CM | POA: Diagnosis not present

## 2015-01-15 DIAGNOSIS — D649 Anemia, unspecified: Secondary | ICD-10-CM

## 2015-01-15 DIAGNOSIS — Z79899 Other long term (current) drug therapy: Secondary | ICD-10-CM

## 2015-01-15 DIAGNOSIS — F419 Anxiety disorder, unspecified: Secondary | ICD-10-CM | POA: Diagnosis not present

## 2015-01-15 LAB — CBC WITH DIFFERENTIAL/PLATELET
Basophils Absolute: 0 10*3/uL (ref 0–0.1)
Basophils Relative: 1 %
Eosinophils Absolute: 0.1 10*3/uL (ref 0–0.7)
Eosinophils Relative: 2 %
HCT: 41.5 % (ref 35.0–47.0)
Hemoglobin: 13.6 g/dL (ref 12.0–16.0)
Lymphocytes Relative: 21 %
Lymphs Abs: 1.4 10*3/uL (ref 1.0–3.6)
MCH: 29.2 pg (ref 26.0–34.0)
MCHC: 32.7 g/dL (ref 32.0–36.0)
MCV: 89.2 fL (ref 80.0–100.0)
Monocytes Absolute: 0.5 10*3/uL (ref 0.2–0.9)
Monocytes Relative: 8 %
Neutro Abs: 4.6 10*3/uL (ref 1.4–6.5)
Neutrophils Relative %: 68 %
Platelets: 317 10*3/uL (ref 150–440)
RBC: 4.65 MIL/uL (ref 3.80–5.20)
RDW: 12.4 % (ref 11.5–14.5)
WBC: 6.7 10*3/uL (ref 3.6–11.0)

## 2015-01-15 LAB — IRON AND TIBC
Iron: 75 ug/dL (ref 28–170)
Saturation Ratios: 19 % (ref 10.4–31.8)
TIBC: 387 ug/dL (ref 250–450)
UIBC: 312 ug/dL

## 2015-01-15 LAB — FERRITIN: Ferritin: 11 ng/mL (ref 11–307)

## 2015-01-22 ENCOUNTER — Encounter: Payer: Self-pay | Admitting: Psychiatry

## 2015-01-22 ENCOUNTER — Ambulatory Visit (INDEPENDENT_AMBULATORY_CARE_PROVIDER_SITE_OTHER): Payer: Medicare Other | Admitting: Psychiatry

## 2015-01-22 VITALS — BP 122/76 | HR 104 | Temp 99.0°F | Ht 67.0 in | Wt 198.2 lb

## 2015-01-22 DIAGNOSIS — F331 Major depressive disorder, recurrent, moderate: Secondary | ICD-10-CM

## 2015-01-22 DIAGNOSIS — F410 Panic disorder [episodic paroxysmal anxiety] without agoraphobia: Secondary | ICD-10-CM

## 2015-01-22 MED ORDER — ZOLPIDEM TARTRATE 10 MG PO TABS: 10.0000 mg | ORAL_TABLET | Freq: Every evening | ORAL | Status: DC | PRN

## 2015-01-22 MED ORDER — BUPROPION HCL ER (XL) 150 MG PO TB24: 450.0000 mg | ORAL_TABLET | Freq: Every day | ORAL | Status: DC

## 2015-01-22 NOTE — Progress Notes (Signed)
BH MD/PA/NP OP Progress Note  01/22/2015 4:05 PM KEIOSHA CAPLAN  MRN:  [id number]  Subjective:  Patient returns for follow-up for major depressive disorder and panic disorder. She indicates she feels the increase in her Wellbutrin self origin 50 mg a day was helpful. She states she feels like she does have more energy and is able to push through things better. She states that she took the initiative to wean herself off the Cymbalta. She states that she felt like the Cymbalta may have been causing her difficulties with sleep. She states that she's been off of the Cymbalta she feels like she is sleeping better. Noticed any worsening with her mood and actually feels like it might be better.  She states that her biggest concern right now are the holidays and getting along with her mother-in-law. She states previously she got along well with her mother-in-law but since her baby has been born she feels like her mother-in-law is "jealous" of her daughter.  She states she saw a neurologist related to her pain and spells. I did review that note as patient discuss being assessed for seizures. The note indicated that the diagnosis was nonepileptic spells.  Chief Complaint: A little better Chief Complaint    Follow-up; Medication Refill     Visit Diagnosis:     ICD-9-CM ICD-10-CM   1. Panic disorder 300.01 F41.0   2. Major depressive disorder, recurrent episode, moderate (HCC) 296.32 F33.1     Past Medical History:  Past Medical History  Diagnosis Date  . Anxiety   . PTSD (post-traumatic stress disorder)   . Depression   . Diabetes mellitus, type II ([place])   . Seizures ([place])   . Headache   . Neuropathy [facility])     Past Surgical History  Procedure Laterality Date  . Gastric bypass    . Cholecystectomy    . Hernia repair     Family History:  Family History  Problem Relation Age of Onset  . Arthritis/Rheumatoid Mother   . Heart block Mother   . Clotting disorder Mother   . Osteoporosis  Mother   . Heart attack Mother   . Anxiety disorder Mother   . Depression Mother   . Heart disease Father   . Heart attack Father   . Depression Sister   . Anxiety disorder Sister    Social History:  Social History   Social History  . Marital Status: Married    Spouse Name: N/A  . Number of Children: N/A  . Years of Education: N/A   Social History Main Topics  . Smoking status: Never Smoker   . Smokeless tobacco: Never Used  . Alcohol Use: No  . Drug Use: No  . Sexual Activity: Yes    Birth Control/ Protection: Injection   Other Topics Concern  . None   Social History Narrative   Additional History:   Assessment:   Musculoskeletal: Strength & Muscle Tone: within normal limits Gait & Station: normal Patient leans: N/A  Psychiatric Specialty Exam: HPI  Review of Systems  Psychiatric/Behavioral: Negative for depression, suicidal ideas, hallucinations, memory loss and substance abuse. The patient is not nervous/anxious (worry about interactions with relatives during the holidays.) and does not have insomnia.   All other systems reviewed and are negative.   Blood pressure 122/76, pulse 104, temperature 99 F (37.2 C), temperature source Tympanic, height 5\' 7"  (1.702 m), weight 198 lb 3.2 oz (89.903 kg), SpO2 96 %.Body mass index is 31.04 kg/(m^2).  General  Appearance: Well Groomed  Eye Contact:  Good  Speech:  Normal Rate  Volume:  Normal  Mood:  a little better  Affect:  Slightly more reactive, slightly brighter  Thought Process:  Linear and Logical  Orientation:  Full (Time, Place, and Person)  Thought Content:  Negative  Suicidal Thoughts:  No  Homicidal Thoughts:  No  Memory:  Immediate;   Good Recent;   Good Remote;   Good  Judgement:  Good  Insight:  Good  Psychomotor Activity:  Negative  Concentration:  Good  Recall:  Good  Fund of Knowledge: Good  Language: Good  Akathisia:  Negative  Handed:  Rightunknown  AIMS (if indicated):  N/A  Assets:   Communication Skills Desire for Improvement Social Support  ADL's:  Intact  Cognition: WNL  Sleep:  Good with Ambien   Is the patient at risk to self?  No. Has the patient been a risk to self in the past 6 months?  No. Has the patient been a risk to self within the distant past?  No. Is the patient a risk to others?  No. Has the patient been a risk to others in the past 6 months?  No. Has the patient been a risk to others within the distant past?  No.  Current Medications: Current Outpatient Prescriptions  Medication Sig Dispense Refill  . ACCU-CHEK AVIVA PLUS test strip USE TO CHECK BLOOD SUGAR 4 TIMES A DAY 024.419  4  . ACCU-CHEK SOFTCLIX LANCETS lancets     . acetaminophen (TYLENOL) 325 MG tablet     . albuterol (PROAIR HFA) 108 (90 BASE) MCG/ACT inhaler     . ALPRAZolam (XANAX) 1 MG tablet Take 1 tablet (1 mg total) by mouth daily as needed for anxiety. 30 tablet 2  . calcium carbonate (OS-CAL - DOSED IN MG OF ELEMENTAL CALCIUM) 1250 (500 CA) MG tablet Frequency:PHARMDIR   Dosage:0.0     Instructions:  Note:4 days a week Dose: 50000 INT UNITS    . cefdinir (OMNICEF) 300 MG capsule TAKE 2 CAPSULES BY MOUTH EVERY DAY FOR 10 DAYS FOR SINUSITIS    . clonazePAM (KLONOPIN) 1 MG tablet Take 1 tablet (1 mg total) by mouth 3 (three) times daily. 90 tablet 2  . cyanocobalamin (,VITAMIN B-12,) 1000 MCG/ML injection Inject into the muscle.    . diclofenac sodium (VOLTAREN) 1 % GEL APPLY 4 GMS EVERY 6 HOURS AS NEEDED FOR PAIN    . folic acid (FOLVITE) 1 MG tablet Take 3 mg by mouth daily.  12  . GLUCAGON EMERGENCY 1 MG injection INJECT 1 MG INTO THE MUSCLE ONCE AS NEEDED FOR LOW BLOOD SUGAR.  11  . HYDROcodone-acetaminophen (NORCO/VICODIN) 5-325 MG tablet Take 1 tablet by mouth every 6 (six) hours as needed. for pain  0  . Insulin Syringe-Needle U-100 (INSULIN SYRINGE .5CC/31GX5/16") 31G X 5/16" 0.5 ML MISC     . medroxyPROGESTERone (DEPO-PROVERA) 150 MG/ML injection See admin instructions.  0   . pantoprazole (PROTONIX) 40 MG tablet TAKE 1 TABLET (40 MG TOTAL) BY MOUTH EVERY 12 (TWELVE) HOURS.  8  . Prenatal Vit-Fe Fumarate-FA (PRENATAL 19) 29-1 MG CHEW Chew by mouth.    . sucralfate (CARAFATE) 1 G tablet TAKE 1 TABLET (1 G TOTAL) BY MOUTH 4 (FOUR) TIMES DAILY BEFORE MEALS AND NIGHTLY.  7  . Thiamine Mononitrate (HM VITAMIN B1) 100 MG TABS Take 100 mg by mouth.    . topiramate (TOPAMAX) 25 MG tablet Take by mouth.    [name]  traMADol (ULTRAM) 50 MG tablet TAKE 1 OR 2 TABLETS BY MOUTH EVERY 4 HOURS AS NEEDED FOR PAIN *MAX OF 8 PER 24 HOURS*    . Vitamin D, Ergocalciferol, (DRISDOL) 50000 UNITS CAPS capsule TAKE ONE CAPSULE BY MOUTH TWICE A WEEK  0  . buPROPion (WELLBUTRIN XL) 150 MG 24 hr tablet Take 3 tablets (450 mg total) by mouth daily. 90 tablet 4  . zolpidem (AMBIEN) 10 MG tablet Take 1 tablet (10 mg total) by mouth at bedtime as needed for sleep. 30 tablet 4   No current facility-administered medications for this visit.    Medical Decision Making:  Established Problem, Stable/Improving (1), Review of Medication Regimen & Side Effects (2) and Review of New Medication or Change in Dosage (2)  Treatment Plan Summary:Medication management and Plan   Major depressive disorder, recurrent, moderate-patient weaned herself off Cymbalta and feels like she might have more energy and is sleeping better since discontinuing it. Continue Wellbutrin XL 450 mg. She prefers to get the 150 mg tablets so she does not have to go to prior authorization for the 450 mg tablet.  Insomnia- continue Ambien 10 mg at bedtime as needed.  Panic disorder without or phobia. We will continue the patient's Klonopin 1 mg 3 times a day as needed. She will continue on her alprazolam 1 mg daily as needed.  Patient will follow up in 1 month. She's been encouraged call any questions or concerns prior to her next appointment.   Faith Rogue 01/22/2015, 4:05 PM

## 2015-01-24 NOTE — Progress Notes (Signed)
[place]  [phone] [phone]  ID: [name] OB: 09/14/1975  MR#: [id number]  [id number]  Patient Care Team: Leonard Downing, MD as PCP - General (Family Medicine)  CHIEF COMPLAINT:  Chief Complaint  Patient presents with  . Anemia     INTERVAL HISTORY: Patient last seen in clinic in December 2015. She will return to clinic today with complaints of increased weakness and fatigue. She recently has given birth and her son is now 13 months old. She also feels she has worsening rheumatoid arthritis. She has no neurologic complaints.  She has had no recent fevers or illnesses.  She denies any chest pain or shortness of breath.  She has a good appetite and denies any nausea, vomiting, constipation, or diarrhea.  She has no melena or hematochezia.  She has no urinary complaints.  Patient offers no further specific complaints today.    REVIEW OF SYSTEMS:   Review of Systems  Constitutional: Positive for malaise/fatigue. Negative for fever.  Respiratory: Negative.   Cardiovascular: Negative.   Gastrointestinal: Negative.   Musculoskeletal: Positive for back pain, joint pain and neck pain.  Neurological: Positive for weakness.  Psychiatric/Behavioral: Positive for depression.    As per HPI. Otherwise, a complete review of systems is negatve.  PAST MEDICAL HISTORY: Past Medical History  Diagnosis Date  . Anxiety   . PTSD (post-traumatic stress disorder)   . Depression   . Diabetes mellitus, type II ([place])   . Seizures ([place])   . Headache   . Neuropathy ([place])     PAST SURGICAL HISTORY: Past Surgical History  Procedure Laterality Date  . Gastric bypass    . Cholecystectomy    . Hernia repair      FAMILY HISTORY Family History  Problem Relation Age of Onset  . Arthritis/Rheumatoid Mother   . Heart block Mother   . Clotting disorder Mother   . Osteoporosis Mother   . Heart attack Mother   . Anxiety disorder  Mother   . Depression Mother   . Heart disease Father   . Heart attack Father   . Depression Sister   . Anxiety disorder Sister        ADVANCED DIRECTIVES:    HEALTH MAINTENANCE: Social History  Substance Use Topics  . Smoking status: Never Smoker   . Smokeless tobacco: Never Used  . Alcohol Use: No     Colonoscopy:  PAP:  Bone density:  Lipid panel:  Allergies  Allergen Reactions  . Amoxicillin Anaphylaxis, Hives, Rash, Shortness Of Breath and Swelling    Other reaction(s): ANAPHYLAXIS Other reaction(s): ANAPHYLAXIS  . Penicillins Anaphylaxis, Rash and Swelling    Other reaction(s): ANAPHYLAXIS  . Morphine Other (See Comments)    Muscle spasms  . Sulfa Antibiotics     Other reaction(s): VOMITING    Current Outpatient Prescriptions  Medication Sig Dispense Refill  . cefdinir (OMNICEF) 300 MG capsule TAKE 2 CAPSULES BY MOUTH EVERY DAY FOR 10 DAYS FOR SINUSITIS    . cyanocobalamin (,VITAMIN B-12,) 1000 MCG/ML injection Inject into the muscle.    . diclofenac sodium (VOLTAREN) 1 % GEL APPLY 4 GMS EVERY 6 HOURS AS NEEDED FOR PAIN    . traMADol (ULTRAM) 50 MG tablet TAKE 1 OR 2 TABLETS BY MOUTH EVERY 4 HOURS AS NEEDED FOR PAIN *MAX OF 8 PER 24 HOURS*    . ACCU-CHEK AVIVA PLUS test strip USE TO CHECK BLOOD SUGAR 4 TIMES A DAY 024.419  4  .  ACCU-CHEK SOFTCLIX LANCETS lancets     . acetaminophen (TYLENOL) 325 MG tablet     . albuterol (PROAIR HFA) 108 (90 BASE) MCG/ACT inhaler     . ALPRAZolam (XANAX) 1 MG tablet Take 1 tablet (1 mg total) by mouth daily as needed for anxiety. 30 tablet 2  . buPROPion (WELLBUTRIN XL) 150 MG 24 hr tablet Take 3 tablets (450 mg total) by mouth daily. 90 tablet 4  . calcium carbonate (OS-CAL - DOSED IN MG OF ELEMENTAL CALCIUM) 1250 (500 CA) MG tablet Frequency:PHARMDIR   Dosage:0.0     Instructions:  Note:4 days a week Dose: 50000 INT UNITS    . clonazePAM (KLONOPIN) 1 MG tablet Take 1 tablet (1 mg total) by mouth 3 (three) times daily. 90  tablet 2  . folic acid (FOLVITE) 1 MG tablet Take 3 mg by mouth daily.  12  . GLUCAGON EMERGENCY 1 MG injection INJECT 1 MG INTO THE MUSCLE ONCE AS NEEDED FOR LOW BLOOD SUGAR.  11  . HYDROcodone-acetaminophen (NORCO/VICODIN) 5-325 MG tablet Take 1 tablet by mouth every 6 (six) hours as needed. for pain  0  . Insulin Syringe-Needle U-100 (INSULIN SYRINGE .5CC/31GX5/16") 31G X 5/16" 0.5 ML MISC     . medroxyPROGESTERone (DEPO-PROVERA) 150 MG/ML injection See admin instructions.  0  . pantoprazole (PROTONIX) 40 MG tablet TAKE 1 TABLET (40 MG TOTAL) BY MOUTH EVERY 12 (TWELVE) HOURS.  8  . Prenatal Vit-Fe Fumarate-FA (PRENATAL 19) 29-1 MG CHEW Chew by mouth.    . sucralfate (CARAFATE) 1 G tablet TAKE 1 TABLET (1 G TOTAL) BY MOUTH 4 (FOUR) TIMES DAILY BEFORE MEALS AND NIGHTLY.  7  . Thiamine Mononitrate (HM VITAMIN B1) 100 MG TABS Take 100 mg by mouth.    . topiramate (TOPAMAX) 25 MG tablet Take by mouth.    . Vitamin D, Ergocalciferol, (DRISDOL) 50000 UNITS CAPS capsule TAKE ONE CAPSULE BY MOUTH TWICE A WEEK  0  . zolpidem (AMBIEN) 10 MG tablet Take 1 tablet (10 mg total) by mouth at bedtime as needed for sleep. 30 tablet 4   No current facility-administered medications for this visit.    OBJECTIVE: Filed Vitals:   01/15/15 1555  BP: 118/79  Pulse: 78  Temp: 99.2 F (37.3 C)     Body mass index is 31.34 kg/(m^2).    ECOG FS:0 - Asymptomatic  General: Well-developed, well-nourished, no acute distress. Eyes: Pink conjunctiva, anicteric sclera. Lungs: Clear to auscultation bilaterally. Heart: Regular rate and rhythm. No rubs, murmurs, or gallops. Abdomen: Soft, nontender, nondistended. No organomegaly noted, normoactive bowel sounds. Musculoskeletal: No edema, cyanosis, or clubbing. Neuro: Alert, answering all questions appropriately. Cranial nerves grossly intact. Skin: No rashes or petechiae noted. Psych: Normal affect.   LAB RESULTS:  Lab Results  Component Value Date   NA 137  11/27/2014   K 3.6 11/27/2014   CL 109 11/27/2014   CO2 24 11/27/2014   GLUCOSE 99 11/27/2014   BUN 14 11/27/2014   CREATININE 0.53 11/27/2014   CALCIUM 8.2* 11/27/2014   PROT 6.7 11/27/2014   ALBUMIN 3.7 11/27/2014   AST 16 11/27/2014   ALT 15 11/27/2014   ALKPHOS 24* 11/27/2014   BILITOT 0.5 11/27/2014   GFRNONAA >60 11/27/2014   GFRAA >60 11/27/2014    Lab Results  Component Value Date   WBC 6.7 01/15/2015   NEUTROABS 4.6 01/15/2015   HGB 13.6 01/15/2015   HCT 41.5 01/15/2015   MCV 89.2 01/15/2015   PLT 317 01/15/2015  STUDIES: No results found.  ASSESSMENT: Iron deficiency anemia.  PLAN:    1.  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  Patient's hemoglobin and iron stores are within normal limits. She last received IV iron in December 2015. No intervention is needed at this time. Return to clinic in 4 months with repeat laboratory work and further evaluation. 2. Rheumatoid arthritis: Continued workup to rheumatology. 3. Depression/anxiety:  Continue current medications as prescribed.  Patient expressed understanding and was in agreement with this plan. She also understands that She can call clinic at any time with any questions, concerns, or complaints.     Lloyd Huger, MD   01/24/2015 4:32 PM

## 2015-01-28 ENCOUNTER — Telehealth: Payer: Self-pay

## 2015-01-28 NOTE — Telephone Encounter (Signed)
pt called wanted to find out about rx , pt was told that i did call in both rx but that the pharmacist , Dorian Pod wanted to confirm with Dr. Jimmye Norman and wanted to find out why he felt like both medications were needed.

## 2015-01-28 NOTE — Telephone Encounter (Signed)
ellen from [facility] wants you to call her to confirm that pt is suppose to be on both xanax and clonazepan [phone]

## 2015-02-12 NOTE — Progress Notes (Signed)
Pharmacy notified.

## 2015-02-19 ENCOUNTER — Encounter: Payer: Self-pay | Admitting: Psychiatry

## 2015-02-19 ENCOUNTER — Ambulatory Visit (INDEPENDENT_AMBULATORY_CARE_PROVIDER_SITE_OTHER): Payer: Medicare Other | Admitting: Psychiatry

## 2015-02-19 VITALS — BP 120/70 | HR 94 | Temp 98.8°F | Ht 67.0 in | Wt 190.6 lb

## 2015-02-19 DIAGNOSIS — F410 Panic disorder [episodic paroxysmal anxiety] without agoraphobia: Secondary | ICD-10-CM

## 2015-02-19 DIAGNOSIS — F331 Major depressive disorder, recurrent, moderate: Secondary | ICD-10-CM | POA: Diagnosis not present

## 2015-02-19 MED ORDER — SERTRALINE HCL 50 MG PO TABS: ORAL_TABLET | ORAL | Status: DC

## 2015-02-19 NOTE — Progress Notes (Signed)
BH MD/PA/NP OP Progress Note  02/19/2015 1:52 PM [name]  MRN:  [id number]  Subjective:  Patient returns for follow-up for major depressive disorder and panic disorder. Patient states that she did well through the holidays. At her last visit she had expressed concern about getting along with her mother-in-law during holidays. However she states that probably the last few days in December she started to have depressed mood and obsessive thoughts about relatives dying and anxiety. She states that she really felt like the medications were working but since the end of December she feels like she is now having significant depression and anxiety and worry. She does note that slowly being the new year when everyone else is looking forward to things developing she does not have anything new or any significant changes in her life. She states that right now she is depressed and the only thing she wants to do is take care of her daughter  Note at the last visit the patient had weaned herself off Cymbalta secondary to the Cymbalta causing her insomnia. She states since being off the Cymbalta she is sleeping much better.  Chief Complaint: I feel like I'm "suffocating" and everyone else around me is breathing Chief Complaint    Follow-up; Medication Refill     Visit Diagnosis:     ICD-9-CM ICD-10-CM   1. Panic disorder 300.01 F41.0   2. Major depressive disorder, recurrent episode, moderate (HCC) 296.32 F33.1     Past Medical History:  Past Medical History  Diagnosis Date  . Anxiety   . PTSD (post-traumatic stress disorder)   . Depression   . Diabetes mellitus, type II ([place])   . Seizures ([place])   . Headache   . Neuropathy [facility])     Past Surgical History  Procedure Laterality Date  . Gastric bypass    . Cholecystectomy    . Hernia repair     Family History:  Family History  Problem Relation Age of Onset  . Arthritis/Rheumatoid Mother   . Heart block Mother   . Clotting disorder  Mother   . Osteoporosis Mother   . Heart attack Mother   . Anxiety disorder Mother   . Depression Mother   . Heart disease Father   . Heart attack Father   . Depression Sister   . Anxiety disorder Sister    Social History:  Social History   Social History  . Marital Status: Married    Spouse Name: N/A  . Number of Children: N/A  . Years of Education: N/A   Social History Main Topics  . Smoking status: Never Smoker   . Smokeless tobacco: Never Used  . Alcohol Use: No  . Drug Use: No  . Sexual Activity: Yes    Birth Control/ Protection: Injection   Other Topics Concern  . None   Social History Narrative   Additional History:   Assessment:   Musculoskeletal: Strength & Muscle Tone: within normal limits Gait & Station: normal Patient leans: N/A  Psychiatric Specialty Exam: HPI  Review of Systems  Psychiatric/Behavioral: Positive for depression. Negative for suicidal ideas, hallucinations, memory loss and substance abuse. The patient is nervous/anxious (excessive thoughts about harm coming to relatives, comments or parents are getting older). The patient does not have insomnia.   All other systems reviewed and are negative.   Blood pressure 120/70, pulse 94, temperature 98.8 F (37.1 C), temperature source Tympanic, height 5\' 7"  (1.702 m), weight 190 lb 9.6 oz (86.456 kg), SpO2 97 %.  Body mass index is 29.85 kg/(m^2).  General Appearance: Well Groomed  Eye Contact:  Good  Speech:  Normal Rate  Volume:  Normal  Mood:  suffocating  Affect:  Depressed and tearful  Thought Process:  Linear and Logical  Orientation:  Full (Time, Place, and Person)  Thought Content:  Negative  Suicidal Thoughts:  No  Homicidal Thoughts:  No  Memory:  Immediate;   Good Recent;   Good Remote;   Good  Judgement:  Good  Insight:  Good  Psychomotor Activity:  Negative  Concentration:  Good  Recall:  Good  Fund of Knowledge: Good  Language: Good  Akathisia:  Negative  Handed:   Rightunknown  AIMS (if indicated):  N/A  Assets:  Communication Skills Desire for Improvement Social Support  ADL's:  Intact  Cognition: WNL  Sleep:  Good with Ambien   Is the patient at risk to self?  No. Has the patient been a risk to self in the past 6 months?  No. Has the patient been a risk to self within the distant past?  No. Is the patient a risk to others?  No. Has the patient been a risk to others in the past 6 months?  No. Has the patient been a risk to others within the distant past?  No.  Current Medications: Current Outpatient Prescriptions  Medication Sig Dispense Refill  . acetaminophen (TYLENOL) 325 MG tablet     . albuterol (PROAIR HFA) 108 (90 BASE) MCG/ACT inhaler     . ALPRAZolam (XANAX) 1 MG tablet Take 1 tablet (1 mg total) by mouth daily as needed for anxiety. 30 tablet 2  . buPROPion (WELLBUTRIN XL) 150 MG 24 hr tablet Take 3 tablets (450 mg total) by mouth daily. 90 tablet 4  . calcium carbonate (OS-CAL - DOSED IN MG OF ELEMENTAL CALCIUM) 1250 (500 CA) MG tablet Frequency:PHARMDIR   Dosage:0.0     Instructions:  Note:4 days a week Dose: 50000 INT UNITS    . cefdinir (OMNICEF) 300 MG capsule TAKE 2 CAPSULES BY MOUTH EVERY DAY FOR 10 DAYS FOR SINUSITIS    . clonazePAM (KLONOPIN) 1 MG tablet Take 1 tablet (1 mg total) by mouth 3 (three) times daily. 90 tablet 2  . diclofenac sodium (VOLTAREN) 1 % GEL APPLY 4 GMS EVERY 6 HOURS AS NEEDED FOR PAIN    . docusate sodium (COLACE) 100 MG capsule     . folic acid (FOLVITE) 1 MG tablet Take 3 mg by mouth daily.  12  . GLUCAGON EMERGENCY 1 MG injection INJECT 1 MG INTO THE MUSCLE ONCE AS NEEDED FOR LOW BLOOD SUGAR.  11  . Insulin Syringe-Needle U-100 (INSULIN SYRINGE .5CC/31GX5/16") 31G X 5/16" 0.5 ML MISC     . medroxyPROGESTERone (DEPO-PROVERA) 150 MG/ML injection See admin instructions.  0  . pantoprazole (PROTONIX) 40 MG tablet TAKE 1 TABLET (40 MG TOTAL) BY MOUTH EVERY 12 (TWELVE) HOURS.  8  . Prenatal Vit-Fe  Fumarate-FA (PRENATAL 19) 29-1 MG CHEW Chew by mouth.    . sucralfate (CARAFATE) 1 G tablet TAKE 1 TABLET (1 G TOTAL) BY MOUTH 4 (FOUR) TIMES DAILY BEFORE MEALS AND NIGHTLY.  7  . Thiamine Mononitrate (HM VITAMIN B1) 100 MG TABS Take 100 mg by mouth.    . topiramate (TOPAMAX) 25 MG tablet Take by mouth.    . zolpidem (AMBIEN) 10 MG tablet Take 1 tablet (10 mg total) by mouth at bedtime as needed for sleep. 30 tablet 4  . sertraline (  ZOLOFT) 50 MG tablet Take one half a tablet in the morning for one week then increase to one whole tablet in the morning. 30 tablet 1   No current facility-administered medications for this visit.    Medical Decision Making:  Established Problem, Stable/Improving (1), Review of Medication Regimen & Side Effects (2) and Review of New Medication or Change in Dosage (2)  Treatment Plan Summary:Medication management and Plan   Major depressive disorder, recurrent, moderate- Continue Wellbutrin XL 450 mg. She prefers to get the 150 mg tablets so she does not have to go to prior authorization for the 450 mg tablet. The patient has been on sertraline in the past but it appears it was discontinued when she became pregnant. Thus we will restart some sertraline 25 mg in the morning for 1 week and then she'll increase to 50 mg in the morning. Risk and benefits of been discussing patient's able to consent.  Insomnia- continue Ambien 10 mg at bedtime as needed.  Panic disorder without or phobia. We will continue the patient's Klonopin 1 mg 3 times a day as needed. She will continue on her alprazolam 1 mg daily as needed.  Patient will follow up in 1 month. Aware of my departure next month however she will see me in follow-up prior to that. She is aware she can continue to see another provider within this clinic. She's been encouraged call any questions or concerns prior to her next appointment.   Faith Rogue 02/19/2015, 1:52 PM

## 2015-03-19 ENCOUNTER — Ambulatory Visit (INDEPENDENT_AMBULATORY_CARE_PROVIDER_SITE_OTHER): Payer: Medicare Other | Admitting: Psychiatry

## 2015-03-19 ENCOUNTER — Encounter: Payer: Self-pay | Admitting: Psychiatry

## 2015-03-19 VITALS — BP 124/78 | HR 114 | Temp 98.4°F | Ht 67.0 in | Wt 182.4 lb

## 2015-03-19 DIAGNOSIS — F410 Panic disorder [episodic paroxysmal anxiety] without agoraphobia: Secondary | ICD-10-CM

## 2015-03-19 DIAGNOSIS — F331 Major depressive disorder, recurrent, moderate: Secondary | ICD-10-CM

## 2015-03-19 MED ORDER — SERTRALINE HCL 100 MG PO TABS: ORAL_TABLET | ORAL | Status: DC

## 2015-03-19 MED ORDER — CLONAZEPAM 1 MG PO TABS: 1.0000 mg | ORAL_TABLET | Freq: Three times a day (TID) | ORAL | Status: DC

## 2015-03-19 MED ORDER — ZOLPIDEM TARTRATE 10 MG PO TABS: 10.0000 mg | ORAL_TABLET | Freq: Every evening | ORAL | Status: DC | PRN

## 2015-03-19 MED ORDER — ALPRAZOLAM 1 MG PO TABS: 1.0000 mg | ORAL_TABLET | Freq: Every day | ORAL | Status: DC | PRN

## 2015-03-19 NOTE — Progress Notes (Signed)
BH MD/PA/NP OP Progress Note  03/19/2015 1:52 PM [name]  MRN:  [id number]  Subjective:  Patient returns for follow-up for major depressive disorder and panic disorder. She states she feels a little better. When asked to cite why she has that opinion she stated she is not crying as much. She states that before we added the sertraline she was crying every day and now she states that it can vary between 2 times a week and 4 times a week but is better than daily. She indicated overall her anxiety is under control with the clonazepam and that she uses her alprazolam only when she has panic attack. She states she does not use the alprazolam on a daily or regular basis.  Patient stated she was doing a little better in regards to her mood as above. However when I asked her if she had fun doing anything she smiled and stated she would not go that far as to say that yet.  Discussed with her whether she was engaged in any, therapy and she states she has been seeing her pastor and communicates with him every day. However she states that in April he will be leaving. I did recommend that she pursue seeing a therapist on a regular basis because she does constantly describe herself as "fragile" and that her state can often depend on stressors around her such as what is going on with her children. She had previous to seeing a therapist in this clinic who is in private practice and she is interested in trying to connect with that therapist again. I have given her contact information for that therapist now in private practice.  She expressed her concern that her medications would be changed. I told her that I cannot make any guarantees about what another doctor would do with her medications but that I would make a note that the patient felt as stable as she has been on her current medication regimen.  Chief Complaint: I feel little better Chief Complaint    Follow-up; Medication Refill     Visit Diagnosis:      ICD-9-CM ICD-10-CM   1. Panic disorder 300.01 F41.0   2. Major depressive disorder, recurrent episode, moderate (HCC) 296.32 F33.1     Past Medical History:  Past Medical History  Diagnosis Date  . Anxiety   . PTSD (post-traumatic stress disorder)   . Depression   . Diabetes mellitus, type II ([place])   . Seizures ([place])   . Headache   . Neuropathy [facility])     Past Surgical History  Procedure Laterality Date  . Gastric bypass    . Cholecystectomy    . Hernia repair     Family History:  Family History  Problem Relation Age of Onset  . Arthritis/Rheumatoid Mother   . Heart block Mother   . Clotting disorder Mother   . Osteoporosis Mother   . Heart attack Mother   . Anxiety disorder Mother   . Depression Mother   . Heart disease Father   . Heart attack Father   . Depression Sister   . Anxiety disorder Sister    Social History:  Social History   Social History  . Marital Status: Married    Spouse Name: N/A  . Number of Children: N/A  . Years of Education: N/A   Social History Main Topics  . Smoking status: Never Smoker   . Smokeless tobacco: Never Used  . Alcohol Use: No  . Drug Use: No  .  Sexual Activity: Yes    Birth Control/ Protection: Injection   Other Topics Concern  . None   Social History Narrative   Additional History:   Assessment:   Musculoskeletal: Strength & Muscle Tone: within normal limits Gait & Station: normal Patient leans: N/A  Psychiatric Specialty Exam: HPI  Review of Systems  Psychiatric/Behavioral: Positive for depression. Negative for suicidal ideas, hallucinations, memory loss and substance abuse. The patient is nervous/anxious (excessive thoughts about harm coming to relatives, comments or parents are getting older). The patient does not have insomnia.   All other systems reviewed and are negative.   Blood pressure 124/78, pulse 114, temperature 98.4 F (36.9 C), temperature source Tympanic, height 5\' 7"  (1.702 m), weight  182 lb 6.4 oz (82.736 kg), SpO2 98 %.Body mass index is 28.56 kg/(m^2).  General Appearance: Well Groomed  Eye Contact:  Good  Speech:  Normal Rate  Volume:  Normal  Mood:  a little better  Affect:  Depressed and tearful  Thought Process:  Linear and Logical  Orientation:  Full (Time, Place, and Person)  Thought Content:  Negative  Suicidal Thoughts:  No  Homicidal Thoughts:  No  Memory:  Immediate;   Good Recent;   Good Remote;   Good  Judgement:  Good  Insight:  Good  Psychomotor Activity:  Negative  Concentration:  Good  Recall:  Good  Fund of Knowledge: Good  Language: Good  Akathisia:  Negative  Handed:  Rightunknown  AIMS (if indicated):  N/A  Assets:  Communication Skills Desire for Improvement Social Support  ADL's:  Intact  Cognition: WNL  Sleep:  Good with Ambien   Is the patient at risk to self?  No. Has the patient been a risk to self in the past 6 months?  No. Has the patient been a risk to self within the distant past?  No. Is the patient a risk to others?  No. Has the patient been a risk to others in the past 6 months?  No. Has the patient been a risk to others within the distant past?  No.  Current Medications: Current Outpatient Prescriptions  Medication Sig Dispense Refill  . acetaminophen (TYLENOL) 325 MG tablet     . albuterol (PROAIR HFA) 108 (90 BASE) MCG/ACT inhaler     . ALPRAZolam (XANAX) 1 MG tablet Take 1 tablet (1 mg total) by mouth daily as needed for anxiety. 30 tablet 4  . buPROPion (WELLBUTRIN XL) 150 MG 24 hr tablet Take 3 tablets (450 mg total) by mouth daily. 90 tablet 4  . calcium carbonate (OS-CAL - DOSED IN MG OF ELEMENTAL CALCIUM) 1250 (500 CA) MG tablet Frequency:PHARMDIR   Dosage:0.0     Instructions:  Note:4 days a week Dose: 50000 INT UNITS    . cefdinir (OMNICEF) 300 MG capsule TAKE 2 CAPSULES BY MOUTH EVERY DAY FOR 10 DAYS FOR SINUSITIS    . clonazePAM (KLONOPIN) 1 MG tablet Take 1 tablet (1 mg total) by mouth 3 (three)  times daily. 90 tablet 4  . cyanocobalamin (,VITAMIN B-12,) 1000 MCG/ML injection Inject into the muscle.    . diclofenac sodium (VOLTAREN) 1 % GEL APPLY 4 GMS EVERY 6 HOURS AS NEEDED FOR PAIN    . docusate sodium (COLACE) 100 MG capsule     . GLUCAGON EMERGENCY 1 MG injection INJECT 1 MG INTO THE MUSCLE ONCE AS NEEDED FOR LOW BLOOD SUGAR.  11  . Insulin Syringe-Needle U-100 (INSULIN SYRINGE .5CC/31GX5/16") 31G X 5/16" 0.5 ML MISC     .  pantoprazole (PROTONIX) 40 MG tablet TAKE 1 TABLET (40 MG TOTAL) BY MOUTH EVERY 12 (TWELVE) HOURS.  8  . sertraline (ZOLOFT) 100 MG tablet Take one half a tablet in the morning for one week then increase to one whole tablet in the morning. 100 tablet 4  . sertraline (ZOLOFT) 50 MG tablet 150 mg daily.     . sucralfate (CARAFATE) 1 G tablet TAKE 1 TABLET (1 G TOTAL) BY MOUTH 4 (FOUR) TIMES DAILY BEFORE MEALS AND NIGHTLY.  7  . Thiamine Mononitrate (HM VITAMIN B1) 100 MG TABS Take 100 mg by mouth.    . topiramate (TOPAMAX) 25 MG tablet Take by mouth.    . traMADol (ULTRAM) 50 MG tablet TAKE 1 TO 2 TABLETS EVERY 4 HOURS AS NEEDED FOR PAIN NO MORE THAN 8 TABS IN 24 HOURS  0  . zolpidem (AMBIEN) 10 MG tablet Take 1 tablet (10 mg total) by mouth at bedtime as needed for sleep. 30 tablet 4  . zolpidem (AMBIEN) 10 MG tablet Take 1 tablet (10 mg total) by mouth at bedtime as needed for sleep. 30 tablet 2  . zolpidem (AMBIEN) 10 MG tablet Take 1 tablet (10 mg total) by mouth at bedtime as needed for sleep. 30 tablet 4   No current facility-administered medications for this visit.    Medical Decision Making:  Established Problem, Stable/Improving (1), Review of Medication Regimen & Side Effects (2) and Review of New Medication or Change in Dosage (2)  Treatment Plan Summary:Medication management and Plan   Major depressive disorder, recurrent, moderate- Continue Wellbutrin XL 450 mg. She prefers to get the 150 mg tablets so she does not have to go to prior authorization  for the 450 mg tablet. We'll increase her sertraline from 50 mg daily to 100 mg daily.  Insomnia- continue Ambien 10 mg at bedtime as needed.  Panic disorder without or phobia. We will continue the patient's Klonopin 1 mg 3 times a day as needed. She will continue on her alprazolam 1 mg daily as needed.  Patient will follow up in 1 month. She is aware she'll see a new psychiatrist in her next follow-up appointment. . She's been encouraged call any questions or concerns prior to her next appointment.   Faith Rogue 03/19/2015, 1:52 PM

## 2015-03-27 DIAGNOSIS — R1084 Generalized abdominal pain: Secondary | ICD-10-CM | POA: Insufficient documentation

## 2015-04-16 ENCOUNTER — Ambulatory Visit (INDEPENDENT_AMBULATORY_CARE_PROVIDER_SITE_OTHER): Payer: Medicare Other | Admitting: Psychiatry

## 2015-04-16 ENCOUNTER — Encounter: Payer: Self-pay | Admitting: Psychiatry

## 2015-04-16 DIAGNOSIS — F410 Panic disorder [episodic paroxysmal anxiety] without agoraphobia: Secondary | ICD-10-CM

## 2015-04-16 DIAGNOSIS — F339 Major depressive disorder, recurrent, unspecified: Secondary | ICD-10-CM

## 2015-04-16 NOTE — Progress Notes (Signed)
Patient ID: [name], female   DOB: [birth year], 40 y.o.   MRN: [id number] [facility] MD/PA/NP OP Progress Note  04/16/2015 1:12 PM KERAN ROSELLO  MRN:  [id number]  Subjective:  Patient returns for follow-up for major depressive disorder and panic disorder. She was previously seen by Dr.Williams and this is the first visit for this patient with this clinician. Patient reports that she is been doing okay currently. States that this is the best she has done in a long time. States that she is not crying at the drop of a hat and is able to tolerate the stressors around her. She continues to state that she is fragile and she breaks down if there are any stressors around her. She states that she has a neighbor was a Education officer, environmental and she talks to him daily. She is not in any formal therapy. States being compliant with her medications. Discussed with her that she is on 2 different benzodiazepines together Xanax and Klonopin. Discussed that it would be ideal to taper her off one of the benzodiazepines and think about tapering off the Klonopin. Patient became very upset and stated that she is been on these medications for a long time and it'll be difficult for her to function without these. Discussed with her that different physicians practice differently and that there is no evidence for Klonopin being effective for long-term anxiety. Discussed with her that we could look at other strategies such as therapy and better nutrition since she has problems with malnourishment. She denies any suicidal thoughts currently. Reports that mood is stable and she is enjoying her 42-year-old daughter.  Patient reports that she would like to continue with her current medication regimen for now.  Chief Complaint: I feel little better Chief Complaint    Follow-up; Medication Refill     Visit Diagnosis:   No diagnosis found.  Past Medical History:  Past Medical History  Diagnosis Date  . Anxiety   . PTSD (post-traumatic  stress disorder)   . Depression   . Diabetes mellitus, type II (HCC)   . Seizures (HCC)   . Headache   . Neuropathy [facility]     Past Surgical History  Procedure Laterality Date  . Gastric bypass    . Cholecystectomy    . Hernia repair     Family History:  Family History  Problem Relation Age of Onset  . Arthritis/Rheumatoid Mother   . Heart block Mother   . Clotting disorder Mother   . Osteoporosis Mother   . Heart attack Mother   . Anxiety disorder Mother   . Depression Mother   . Heart disease Father   . Heart attack Father   . Depression Sister   . Anxiety disorder Sister    Social History:  Social History   Social History  . Marital Status: Married    Spouse Name: N/A  . Number of Children: N/A  . Years of Education: N/A   Social History Main Topics  . Smoking status: Never Smoker   . Smokeless tobacco: Never Used  . Alcohol Use: No  . Drug Use: No  . Sexual Activity: Yes    Birth Control/ Protection: Injection   Other Topics Concern  . None   Social History Narrative   Additional History:   Assessment:   Musculoskeletal: Strength & Muscle Tone: within normal limits Gait & Station: normal Patient leans: N/A  Psychiatric Specialty Exam: HPI  Review of Systems  Psychiatric/Behavioral: Positive for depression. Negative for suicidal  ideas, hallucinations, memory loss and substance abuse. The patient is nervous/anxious (excessive thoughts about harm coming to relatives, comments or parents are getting older). The patient does not have insomnia.   All other systems reviewed and are negative.   Blood pressure 108/78, pulse 96, temperature 97.9 F (36.6 C), temperature source Tympanic, height 5\' 7"  (1.702 m), weight 174 lb 6.4 oz (79.107 kg), SpO2 98 %.Body mass index is 27.31 kg/(m^2).  General Appearance: Well Groomed  Eye Contact:  Good  Speech:  Normal Rate  Volume:  Normal  Mood:  a little better  Affect:  Normal   Thought Process:  Linear and  Logical  Orientation:  Full (Time, Place, and Person)  Thought Content:  Negative  Suicidal Thoughts:  No  Homicidal Thoughts:  No  Memory:  Immediate;   Good Recent;   Good Remote;   Good  Judgement:  Good  Insight:  Good  Psychomotor Activity:  Negative  Concentration:  Good  Recall:  Good  Fund of Knowledge: Good  Language: Good  Akathisia:  Negative  Handed:  Rightunknown  AIMS (if indicated):  N/A  Assets:  Communication Skills Desire for Improvement Social Support  ADL's:  Intact  Cognition: WNL  Sleep:  Good with Ambien   Is the patient at risk to self?  No. Has the patient been a risk to self in the past 6 months?  No. Has the patient been a risk to self within the distant past?  No. Is the patient a risk to others?  No. Has the patient been a risk to others in the past 6 months?  No. Has the patient been a risk to others within the distant past?  No.  Current Medications: Current Outpatient Prescriptions  Medication Sig Dispense Refill  . acetaminophen (TYLENOL) 325 MG tablet     . ALPRAZolam (XANAX) 1 MG tablet Take 1 tablet (1 mg total) by mouth daily as needed for anxiety. 30 tablet 4  . buPROPion (WELLBUTRIN XL) 150 MG 24 hr tablet Take 3 tablets (450 mg total) by mouth daily. 90 tablet 4  . calcium carbonate (OS-CAL - DOSED IN MG OF ELEMENTAL CALCIUM) 1250 (500 CA) MG tablet Frequency:PHARMDIR   Dosage:0.0     Instructions:  Note:4 days a week Dose: 50000 INT UNITS    . clonazePAM (KLONOPIN) 1 MG tablet Take 1 tablet (1 mg total) by mouth 3 (three) times daily. 90 tablet 4  . cyanocobalamin (,VITAMIN B-12,) 1000 MCG/ML injection Inject into the muscle.    . diclofenac sodium (VOLTAREN) 1 % GEL APPLY 4 GMS EVERY 6 HOURS AS NEEDED FOR PAIN    . GLUCAGON EMERGENCY 1 MG injection INJECT 1 MG INTO THE MUSCLE ONCE AS NEEDED FOR LOW BLOOD SUGAR.  11  . Insulin Syringe-Needle U-100 (INSULIN SYRINGE .5CC/31GX5/16") 31G X 5/16" 0.5 ML MISC     . pantoprazole (PROTONIX)  40 MG tablet TAKE 1 TABLET (40 MG TOTAL) BY MOUTH EVERY 12 (TWELVE) HOURS.  8  . sertraline (ZOLOFT) 100 MG tablet Take one half a tablet in the morning for one week then increase to one whole tablet in the morning. 100 tablet 4  . sucralfate (CARAFATE) 1 G tablet TAKE 1 TABLET (1 G TOTAL) BY MOUTH 4 (FOUR) TIMES DAILY BEFORE MEALS AND NIGHTLY.  7  . Thiamine Mononitrate (HM VITAMIN B1) 100 MG TABS Take 100 mg by mouth.    . topiramate (TOPAMAX) 25 MG tablet Take by mouth.    [name]  traMADol (ULTRAM) 50 MG tablet TAKE 1 TO 2 TABLETS EVERY 4 HOURS AS NEEDED FOR PAIN NO MORE THAN 8 TABS IN 24 HOURS  0  . zolpidem (AMBIEN) 10 MG tablet Take 1 tablet (10 mg total) by mouth at bedtime as needed for sleep. 30 tablet 2   No current facility-administered medications for this visit.    Medical Decision Making:  Established Problem, Stable/Improving (1), Review of Medication Regimen & Side Effects (2) and Review of New Medication or Change in Dosage (2)  Treatment Plan Summary:Medication management and Plan   Major depressive disorder, recurrent, moderate- Continue Wellbutrin XL 450 mg. She prefers to get the 150 mg tablets so she does not have to go to prior authorization for the 450 mg tablet.  Continue sertraline at 100 mg daily   Insomnia- continue Ambien 10 mg at bedtime as needed.  Panic disorder without or phobia. Discussed extensively with patient about tapering off the Klonopin and putting other coping mechanisms and place. However patient is adamant about staying on the current regimen. She has 4 refills from Dr. Mayford Knife previously. On she was recommended to come back in 2 months time and think about the taper. She is aware that this clinician is not comfortable with prescribing to benzodiazepines.   Patient will follow up in 2 month. She's been encouraged call any questions or concerns prior to her next appointment.   Bell[place] Sylvan 04/16/2015, 1:12 PM

## 2015-05-15 ENCOUNTER — Inpatient Hospital Stay: Payer: Medicare Other | Attending: Oncology

## 2015-05-15 ENCOUNTER — Inpatient Hospital Stay: Payer: Medicare Other

## 2015-05-15 ENCOUNTER — Inpatient Hospital Stay (HOSPITAL_BASED_OUTPATIENT_CLINIC_OR_DEPARTMENT_OTHER): Payer: Medicare Other | Admitting: Oncology

## 2015-05-15 DIAGNOSIS — F4310 Post-traumatic stress disorder, unspecified: Secondary | ICD-10-CM | POA: Diagnosis not present

## 2015-05-15 DIAGNOSIS — F419 Anxiety disorder, unspecified: Secondary | ICD-10-CM | POA: Insufficient documentation

## 2015-05-15 DIAGNOSIS — R5383 Other fatigue: Secondary | ICD-10-CM | POA: Diagnosis not present

## 2015-05-15 DIAGNOSIS — M549 Dorsalgia, unspecified: Secondary | ICD-10-CM

## 2015-05-15 DIAGNOSIS — Z79899 Other long term (current) drug therapy: Secondary | ICD-10-CM | POA: Insufficient documentation

## 2015-05-15 DIAGNOSIS — M2550 Pain in unspecified joint: Secondary | ICD-10-CM | POA: Insufficient documentation

## 2015-05-15 DIAGNOSIS — R531 Weakness: Secondary | ICD-10-CM | POA: Diagnosis not present

## 2015-05-15 DIAGNOSIS — Z9884 Bariatric surgery status: Secondary | ICD-10-CM | POA: Diagnosis not present

## 2015-05-15 DIAGNOSIS — E119 Type 2 diabetes mellitus without complications: Secondary | ICD-10-CM

## 2015-05-15 DIAGNOSIS — F329 Major depressive disorder, single episode, unspecified: Secondary | ICD-10-CM | POA: Insufficient documentation

## 2015-05-15 DIAGNOSIS — G629 Polyneuropathy, unspecified: Secondary | ICD-10-CM | POA: Diagnosis not present

## 2015-05-15 DIAGNOSIS — M069 Rheumatoid arthritis, unspecified: Secondary | ICD-10-CM | POA: Insufficient documentation

## 2015-05-15 DIAGNOSIS — M542 Cervicalgia: Secondary | ICD-10-CM | POA: Diagnosis not present

## 2015-05-15 DIAGNOSIS — R569 Unspecified convulsions: Secondary | ICD-10-CM | POA: Insufficient documentation

## 2015-05-15 DIAGNOSIS — D649 Anemia, unspecified: Secondary | ICD-10-CM

## 2015-05-15 DIAGNOSIS — D509 Iron deficiency anemia, unspecified: Secondary | ICD-10-CM | POA: Diagnosis present

## 2015-05-15 LAB — IRON AND TIBC
Iron: 89 ug/dL (ref 28–170)
Saturation Ratios: 26 % (ref 10.4–31.8)
TIBC: 338 ug/dL (ref 250–450)
UIBC: 249 ug/dL

## 2015-05-15 LAB — CBC WITH DIFFERENTIAL/PLATELET
Basophils Absolute: 0 10*3/uL (ref 0–0.1)
Basophils Relative: 1 %
Eosinophils Absolute: 0.1 10*3/uL (ref 0–0.7)
Eosinophils Relative: 2 %
HCT: 39 % (ref 35.0–47.0)
Hemoglobin: 12.9 g/dL (ref 12.0–16.0)
Lymphocytes Relative: 22 %
Lymphs Abs: 1.2 10*3/uL (ref 1.0–3.6)
MCH: 29.8 pg (ref 26.0–34.0)
MCHC: 33.2 g/dL (ref 32.0–36.0)
MCV: 89.9 fL (ref 80.0–100.0)
Monocytes Absolute: 0.3 10*3/uL (ref 0.2–0.9)
Monocytes Relative: 6 %
Neutro Abs: 3.8 10*3/uL (ref 1.4–6.5)
Neutrophils Relative %: 69 %
Platelets: 275 10*3/uL (ref 150–440)
RBC: 4.34 MIL/uL (ref 3.80–5.20)
RDW: 14.4 % (ref 11.5–14.5)
WBC: 5.4 10*3/uL (ref 3.6–11.0)

## 2015-05-15 LAB — FERRITIN: Ferritin: 12 ng/mL (ref 11–307)

## 2015-05-15 NOTE — Progress Notes (Signed)
Patient's fatigue is worse than her usual.  She has been on her menstrual cycle with heavy flow for 4 weeks and has not been evaluated by GYN.

## 2015-05-24 NOTE — Progress Notes (Signed)
[place]  [phone] [phone]  ID: [name] OB: [birth year]  MR#: [id number]  [id number]  Patient Care Team: Leonard Downing, MD as PCP - General (Family Medicine)  CHIEF COMPLAINT:  Chief Complaint  Patient presents with  . Anemia     INTERVAL HISTORY: Patient returns to clinic today for repeat laboratory work, further evaluation, and consideration of additional Feraheme. Her weakness and fatigue is unchanged. She has multiple other medical complaints that are also unchanged.  She has no neurologic complaints.  She has had no recent fevers or illnesses.  She denies any chest pain or shortness of breath.  She has a good appetite and denies any nausea, vomiting, constipation, or diarrhea.  She has no melena or hematochezia.  She has no urinary complaints.  Patient offers no further specific complaints today.    REVIEW OF SYSTEMS:   Review of Systems  Constitutional: Positive for malaise/fatigue. Negative for fever.  Respiratory: Negative.   Cardiovascular: Negative.   Gastrointestinal: Negative.   Musculoskeletal: Positive for back pain, joint pain and neck pain.  Neurological: Positive for weakness.  Psychiatric/Behavioral: Positive for depression.    As per HPI. Otherwise, a complete review of systems is negatve.  PAST MEDICAL HISTORY: Past Medical History  Diagnosis Date  . Anxiety   . PTSD (post-traumatic stress disorder)   . Depression   . Diabetes mellitus, type II ([place])   . Seizures ([place])   . Headache   . Neuropathy ([place])     PAST SURGICAL HISTORY: Past Surgical History  Procedure Laterality Date  . Gastric bypass    . Cholecystectomy    . Hernia repair      FAMILY HISTORY Family History  Problem Relation Age of Onset  . Arthritis/Rheumatoid Mother   . Heart block Mother   . Clotting disorder Mother   . Osteoporosis Mother   . Heart attack Mother   . Anxiety disorder Mother   .  Depression Mother   . Heart disease Father   . Heart attack Father   . Depression Sister   . Anxiety disorder Sister        ADVANCED DIRECTIVES:    HEALTH MAINTENANCE: Social History  Substance Use Topics  . Smoking status: Never Smoker   . Smokeless tobacco: Never Used  . Alcohol Use: No     Colonoscopy:  PAP:  Bone density:  Lipid panel:  Allergies  Allergen Reactions  . Amoxicillin Anaphylaxis, Hives, Rash, Shortness Of Breath and Swelling    Other reaction(s): ANAPHYLAXIS Other reaction(s): ANAPHYLAXIS  . Penicillins Anaphylaxis, Rash and Swelling    Other reaction(s): ANAPHYLAXIS  . Morphine Other (See Comments)    Muscle spasms  . Sulfa Antibiotics     Other reaction(s): VOMITING    Current Outpatient Prescriptions  Medication Sig Dispense Refill  . acetaminophen (TYLENOL) 325 MG tablet     . ALPRAZolam (XANAX) 1 MG tablet Take 1 tablet (1 mg total) by mouth daily as needed for anxiety. 30 tablet 4  . buPROPion (WELLBUTRIN XL) 150 MG 24 hr tablet Take 3 tablets (450 mg total) by mouth daily. 90 tablet 4  . calcium carbonate (OS-CAL - DOSED IN MG OF ELEMENTAL CALCIUM) 1250 (500 CA) MG tablet Frequency:PHARMDIR   Dosage:0.0     Instructions:  Note:4 days a week Dose: 50000 INT UNITS    . clonazePAM (KLONOPIN) 1 MG tablet Take 1 tablet (1 mg total) by mouth 3 (three) times daily. 90 tablet 4  .  cyanocobalamin (,VITAMIN B-12,) 1000 MCG/ML injection Inject into the muscle.    . diclofenac sodium (VOLTAREN) 1 % GEL APPLY 4 GMS EVERY 6 HOURS AS NEEDED FOR PAIN    . GLUCAGON EMERGENCY 1 MG injection INJECT 1 MG INTO THE MUSCLE ONCE AS NEEDED FOR LOW BLOOD SUGAR.  11  . Insulin Syringe-Needle U-100 (INSULIN SYRINGE .5CC/31GX5/16") 31G X 5/16" 0.5 ML MISC     . pantoprazole (PROTONIX) 40 MG tablet TAKE 1 TABLET (40 MG TOTAL) BY MOUTH EVERY 12 (TWELVE) HOURS.  8  . sertraline (ZOLOFT) 100 MG tablet Take one half a tablet in the morning for one week then increase to one  whole tablet in the morning. 100 tablet 4  . sucralfate (CARAFATE) 1 G tablet TAKE 1 TABLET (1 G TOTAL) BY MOUTH 4 (FOUR) TIMES DAILY BEFORE MEALS AND NIGHTLY.  7  . SUMAtriptan (IMITREX) 100 MG tablet Take by mouth.    . topiramate (TOPAMAX) 25 MG tablet Take by mouth.    . traMADol (ULTRAM) 50 MG tablet TAKE 1 TO 2 TABLETS EVERY 4 HOURS AS NEEDED FOR PAIN NO MORE THAN 8 TABS IN 24 HOURS  0  . zolpidem (AMBIEN) 10 MG tablet Take 1 tablet (10 mg total) by mouth at bedtime as needed for sleep. 30 tablet 2   No current facility-administered medications for this visit.    OBJECTIVE: There were no vitals filed for this visit.   There is no weight on file to calculate BMI.    ECOG FS:0 - Asymptomatic  General: Well-developed, well-nourished, no acute distress. Eyes: Pink conjunctiva, anicteric sclera. Lungs: Clear to auscultation bilaterally. Heart: Regular rate and rhythm. No rubs, murmurs, or gallops. Abdomen: Soft, nontender, nondistended. No organomegaly noted, normoactive bowel sounds. Musculoskeletal: No edema, cyanosis, or clubbing. Neuro: Alert, answering all questions appropriately. Cranial nerves grossly intact. Skin: No rashes or petechiae noted. Psych: Normal affect.   LAB RESULTS:  Lab Results  Component Value Date   NA 137 11/27/2014   K 3.6 11/27/2014   CL 109 11/27/2014   CO2 24 11/27/2014   GLUCOSE 99 11/27/2014   BUN 14 11/27/2014   CREATININE 0.53 11/27/2014   CALCIUM 8.2* 11/27/2014   PROT 6.7 11/27/2014   ALBUMIN 3.7 11/27/2014   AST 16 11/27/2014   ALT 15 11/27/2014   ALKPHOS 24* 11/27/2014   BILITOT 0.5 11/27/2014   GFRNONAA >60 11/27/2014   GFRAA >60 11/27/2014    Lab Results  Component Value Date   WBC 5.4 05/15/2015   NEUTROABS 3.8 05/15/2015   HGB 12.9 05/15/2015   HCT 39.0 05/15/2015   MCV 89.9 05/15/2015   PLT 275 05/15/2015   Lab Results  Component Value Date   FERRITIN 12 05/15/2015   Lab Results  Component Value Date   IRON 89  05/15/2015   TIBC 338 05/15/2015   IRONPCTSAT 26 05/15/2015      STUDIES: No results found.  ASSESSMENT: Iron deficiency anemia.  PLAN:    1.  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  Patient's hemoglobin and iron stores continue to be within normal limits. She last received IV iron in December 2015. No intervention is needed at this time. Return to clinic in 6 months with repeat laboratory work and further evaluation. 2. Rheumatoid arthritis: Continued workup to rheumatology. 3. Depression/anxiety:  Continue current medications as prescribed.  Patient expressed understanding and was in agreement with this plan. She also understands that She can call clinic at any time  with any questions, concerns, or complaints.     Lloyd Huger, MD   05/24/2015 8:06 AM

## 2015-06-18 ENCOUNTER — Ambulatory Visit: Payer: Medicare Other | Admitting: Psychiatry

## 2015-06-19 ENCOUNTER — Telehealth: Payer: Self-pay

## 2015-06-19 NOTE — Telephone Encounter (Signed)
received a fax requesting a refill on bupropion xl 150mg  pt has appt on  06-25-12 .  last seen on  04-16-15.

## 2015-06-19 NOTE — Telephone Encounter (Signed)
called in #18 tablets of the bupriopion xl 150mg  . gave patient enough to get to her appt on  06-26-15

## 2015-06-26 ENCOUNTER — Ambulatory Visit (INDEPENDENT_AMBULATORY_CARE_PROVIDER_SITE_OTHER): Payer: Medicare Other | Admitting: Psychiatry

## 2015-06-26 ENCOUNTER — Encounter: Payer: Self-pay | Admitting: Psychiatry

## 2015-06-26 VITALS — BP 110/78 | HR 113 | Temp 97.7°F | Ht 67.0 in | Wt 162.4 lb

## 2015-06-26 DIAGNOSIS — F331 Major depressive disorder, recurrent, moderate: Secondary | ICD-10-CM | POA: Diagnosis not present

## 2015-06-26 DIAGNOSIS — F410 Panic disorder [episodic paroxysmal anxiety] without agoraphobia: Secondary | ICD-10-CM

## 2015-06-26 MED ORDER — BUPROPION HCL ER (XL) 150 MG PO TB24: 450.0000 mg | ORAL_TABLET | Freq: Every day | ORAL | Status: DC

## 2015-06-26 MED ORDER — ZOLPIDEM TARTRATE 10 MG PO TABS: 10.0000 mg | ORAL_TABLET | Freq: Every evening | ORAL | Status: DC | PRN

## 2015-06-26 MED ORDER — SERTRALINE HCL 100 MG PO TABS: ORAL_TABLET | ORAL | Status: DC

## 2015-06-26 NOTE — Progress Notes (Signed)
Patient ID: [name], female   DOB: 17-Jun-1975, 41 y.o.   MRN: [id number]  [facility] MD/PA/NP OP Progress Note  06/26/2015 2:09 PM [name]  MRN:  [id number]  Subjective:  Patient returns for follow-up for major depressive disorder and panic disorder. She states that she's been doing the same. She brings her 74-month-old daughter today and states that she enjoys spending time with her. She has been sleeping okay and eating okay. States that her neighbor who is a Education officer, environmental and with whom she almost talks everyday is leaving. She is looking into restarting therapy with Felecia Jan who she used to see previously. Patient is agreeable to weaning off the Klonopin. She has enough refills from Dr. Mayford Knife. We discussed increasing the Zoloft to help with the anxiety. Patient is agreeable to this plan. She denies any suicidal thoughts currently  Chief Complaint: Doing okay Chief Complaint    Follow-up; Medication Refill     Visit Diagnosis:     ICD-9-CM ICD-10-CM   1. Panic disorder 300.01 F41.0   2. Major depressive disorder, recurrent episode, moderate (H[facility]) 296.32 F33.1     Past Medical History:  Past Medical History  Diagnosis Date  . Anxiety   . PTSD (post-traumatic stress disorder)   . Depression   . Diabetes mellitus, type II ([facility])   . Seizures ([facility])   . Headache   . Neuropathy [facility])     Past Surgical History  Procedure Laterality Date  . Gastric bypass    . Cholecystectomy    . Hernia repair     Family History:  Family History  Problem Relation Age of Onset  . Arthritis/Rheumatoid Mother   . Heart block Mother   . Clotting disorder Mother   . Osteoporosis Mother   . Heart attack Mother   . Anxiety disorder Mother   . Depression Mother   . Heart disease Father   . Heart attack Father   . Depression Sister   . Anxiety disorder Sister    Social History:  Social History   Social History  . Marital Status: Married    Spouse Name: N/A  . Number of  Children: N/A  . Years of Education: N/A   Social History Main Topics  . Smoking status: Never Smoker   . Smokeless tobacco: Never Used  . Alcohol Use: No  . Drug Use: No  . Sexual Activity: Yes    Birth Control/ Protection: Injection   Other Topics Concern  . None   Social History Narrative   Additional History:   Assessment:   Musculoskeletal: Strength & Muscle Tone: within normal limits Gait & Station: normal Patient leans: N/A  Psychiatric Specialty Exam: HPI  Review of Systems  Psychiatric/Behavioral: Positive for depression. Negative for suicidal ideas, hallucinations, memory loss and substance abuse. The patient is nervous/anxious (excessive thoughts about harm coming to relatives, comments or parents are getting older). The patient does not have insomnia.   All other systems reviewed and are negative.   Blood pressure 110/78, pulse 113, temperature 97.7 F (36.5 C), temperature source Tympanic, height 5\' 7"  (1.702 m), weight 162 lb 6.4 oz (73.664 kg), last menstrual period 02/26/2015, SpO2 96 %.Body mass index is 25.43 kg/(m^2).  General Appearance: Well Groomed  Eye Contact:  Good  Speech:  Normal Rate  Volume:  Normal  Mood:  a little better  Affect:  Normal   Thought Process:  Linear and Logical  Orientation:  Full (Time, Place, and Person)  Thought Content:  Negative  Suicidal Thoughts:  No  Homicidal Thoughts:  No  Memory:  Immediate;   Good Recent;   Good Remote;   Good  Judgement:  Good  Insight:  Good  Psychomotor Activity:  Negative  Concentration:  Good  Recall:  Good  Fund of Knowledge: Good  Language: Good  Akathisia:  Negative  Handed:  Rightunknown  AIMS (if indicated):  N/A  Assets:  Communication Skills Desire for Improvement Social Support  ADL's:  Intact  Cognition: WNL  Sleep:  Good with Ambien   Is the patient at risk to self?  No. Has the patient been a risk to self in the past 6 months?  No. Has the patient been a risk to  self within the distant past?  No. Is the patient a risk to others?  No. Has the patient been a risk to others in the past 6 months?  No. Has the patient been a risk to others within the distant past?  No.  Current Medications: Current Outpatient Prescriptions  Medication Sig Dispense Refill  . acetaminophen (TYLENOL) 325 MG tablet     . ALPRAZolam (XANAX) 1 MG tablet Take 1 tablet (1 mg total) by mouth daily as needed for anxiety. 30 tablet 4  . buPROPion (WELLBUTRIN XL) 150 MG 24 hr tablet Take 3 tablets (450 mg total) by mouth daily. 90 tablet 4  . calcium carbonate (OS-CAL - DOSED IN MG OF ELEMENTAL CALCIUM) 1250 (500 CA) MG tablet Frequency:PHARMDIR   Dosage:0.0     Instructions:  Note:4 days a week Dose: 50000 INT UNITS    . clonazePAM (KLONOPIN) 1 MG tablet Take 1 tablet (1 mg total) by mouth 3 (three) times daily. 90 tablet 4  . cyanocobalamin (,VITAMIN B-12,) 1000 MCG/ML injection Inject into the muscle.    . diclofenac sodium (VOLTAREN) 1 % GEL APPLY 4 GMS EVERY 6 HOURS AS NEEDED FOR PAIN    . GLUCAGON EMERGENCY 1 MG injection INJECT 1 MG INTO THE MUSCLE ONCE AS NEEDED FOR LOW BLOOD SUGAR.  11  . Insulin Syringe-Needle U-100 (INSULIN SYRINGE .5CC/31GX5/16") 31G X 5/16" 0.5 ML MISC     . pantoprazole (PROTONIX) 40 MG tablet TAKE 1 TABLET (40 MG TOTAL) BY MOUTH EVERY 12 (TWELVE) HOURS.  8  . sertraline (ZOLOFT) 100 MG tablet Take one half a tablet in the morning for one week then increase to one whole tablet in the morning. 100 tablet 4  . sucralfate (CARAFATE) 1 G tablet TAKE 1 TABLET (1 G TOTAL) BY MOUTH 4 (FOUR) TIMES DAILY BEFORE MEALS AND NIGHTLY.  7  . SUMAtriptan (IMITREX) 100 MG tablet Take by mouth.    . topiramate (TOPAMAX) 25 MG tablet Take by mouth.    . traMADol (ULTRAM) 50 MG tablet TAKE 1 TO 2 TABLETS EVERY 4 HOURS AS NEEDED FOR PAIN NO MORE THAN 8 TABS IN 24 HOURS  0  . zolpidem (AMBIEN) 10 MG tablet Take 1 tablet (10 mg total) by mouth at bedtime as needed for sleep.  30 tablet 2   No current facility-administered medications for this visit.    Medical Decision Making:  Established Problem, Stable/Improving (1), Review of Medication Regimen & Side Effects (2) and Review of New Medication or Change in Dosage (2)  Treatment Plan Summary:Medication management and Plan   Major depressive disorder, recurrent, moderate- Continue Wellbutrin XL 450 mg.  Increase sertraline to 150 mg daily  Insomnia- continue Ambien 10 mg at bedtime as needed.  Panic disorder  without or phobia- taper off the Klonopin by taking 0.5 mg twice daily for 2 weeks and then decreasing to 1 tablet daily at bedtime for 2 weeks. After this time. The Klonopin completely Take Xanax 1 mg 1-2 times weekly as needed for panic attacks  Patient will follow up in 1 month. She's been encouraged call any questions or concerns prior to her next appointment.   Jiles Goya 06/26/2015, 2:09 PM

## 2015-07-30 ENCOUNTER — Ambulatory Visit: Payer: Medicare Other | Admitting: Psychiatry

## 2015-08-07 ENCOUNTER — Emergency Department
Admission: EM | Admit: 2015-08-07 | Discharge: 2015-08-07 | Disposition: A | Discharge status: Home / Self Care | Payer: Medicare Other | Attending: Emergency Medicine | Admitting: Emergency Medicine

## 2015-08-07 ENCOUNTER — Emergency Department: Payer: Medicare Other

## 2015-08-07 ENCOUNTER — Encounter: Payer: Self-pay | Admitting: Emergency Medicine

## 2015-08-07 DIAGNOSIS — L03818 Cellulitis of other sites: Secondary | ICD-10-CM | POA: Diagnosis not present

## 2015-08-07 DIAGNOSIS — Z8669 Personal history of other diseases of the nervous system and sense organs: Secondary | ICD-10-CM | POA: Insufficient documentation

## 2015-08-07 DIAGNOSIS — N939 Abnormal uterine and vaginal bleeding, unspecified: Secondary | ICD-10-CM | POA: Diagnosis present

## 2015-08-07 DIAGNOSIS — N83202 Unspecified ovarian cyst, left side: Secondary | ICD-10-CM

## 2015-08-07 DIAGNOSIS — N938 Other specified abnormal uterine and vaginal bleeding: Secondary | ICD-10-CM | POA: Insufficient documentation

## 2015-08-07 DIAGNOSIS — B356 Tinea cruris: Secondary | ICD-10-CM | POA: Insufficient documentation

## 2015-08-07 DIAGNOSIS — E119 Type 2 diabetes mellitus without complications: Secondary | ICD-10-CM | POA: Insufficient documentation

## 2015-08-07 DIAGNOSIS — R102 Pelvic and perineal pain: Secondary | ICD-10-CM

## 2015-08-07 DIAGNOSIS — Z79899 Other long term (current) drug therapy: Secondary | ICD-10-CM | POA: Insufficient documentation

## 2015-08-07 DIAGNOSIS — F329 Major depressive disorder, single episode, unspecified: Secondary | ICD-10-CM | POA: Insufficient documentation

## 2015-08-07 DIAGNOSIS — R1084 Generalized abdominal pain: Secondary | ICD-10-CM | POA: Insufficient documentation

## 2015-08-07 DIAGNOSIS — Z794 Long term (current) use of insulin: Secondary | ICD-10-CM | POA: Diagnosis not present

## 2015-08-07 LAB — URINALYSIS COMPLETE WITH MICROSCOPIC (ARMC ONLY)
Specific Gravity, Urine: 1.036 — ABNORMAL HIGH (ref 1.005–1.030)
Trans Epithel, UA: 3

## 2015-08-07 LAB — HCG, QUANTITATIVE, PREGNANCY: hCG, Beta Chain, Quant, S: <1 m[IU]/mL (ref <5)

## 2015-08-07 LAB — DIFFERENTIAL
Band Neutrophils: 0 %
Basophils Absolute: 0 10*3/uL (ref 0–0.1)
Basophils Relative: 0 %
Blasts: 0 %
Eosinophils Absolute: 0.1 10*3/uL (ref 0–0.7)
Eosinophils Relative: 1 %
Lymphocytes Relative: 9 %
Lymphs Abs: 1 10*3/uL (ref 1.0–3.6)
Metamyelocytes Relative: 0 %
Monocytes Absolute: 0.6 10*3/uL (ref 0.2–0.9)
Monocytes Relative: 5 %
Myelocytes: 0 %
Neutro Abs: 9.5 10*3/uL — ABNORMAL HIGH (ref 1.4–6.5)
Neutrophils Relative %: 85 %
Other: 0 %
Promyelocytes Absolute: 0 %
nRBC: 0 /100 WBC

## 2015-08-07 LAB — COMPREHENSIVE METABOLIC PANEL
ALT: 28 U/L (ref 14–54)
AST: 32 U/L (ref 15–41)
Albumin: 4 g/dL (ref 3.5–5.0)
Alkaline Phosphatase: 54 U/L (ref 38–126)
Anion gap: 10 (ref 5–15)
BUN: 10 mg/dL (ref 6–20)
CO2: 20 mmol/L — ABNORMAL LOW (ref 22–32)
Calcium: 9.3 mg/dL (ref 8.9–10.3)
Chloride: 108 mmol/L (ref 101–111)
Creatinine, Ser: 0.74 mg/dL (ref 0.44–1.00)
GFR calc Af Amer: >60 mL/min (ref >60)
GFR calc non Af Amer: >60 mL/min (ref >60)
Glucose, Bld: 89 mg/dL (ref 65–99)
Potassium: 3.9 mmol/L (ref 3.5–5.1)
Sodium: 138 mmol/L (ref 135–145)
Total Bilirubin: 0.5 mg/dL (ref 0.3–1.2)
Total Protein: 7.5 g/dL (ref 6.5–8.1)

## 2015-08-07 LAB — CBC
HCT: 38.1 % (ref 35.0–47.0)
Hemoglobin: 12.5 g/dL (ref 12.0–16.0)
MCH: 29.4 pg (ref 26.0–34.0)
MCHC: 32.8 g/dL (ref 32.0–36.0)
MCV: 89.6 fL (ref 80.0–100.0)
Platelets: 370 10*3/uL (ref 150–440)
RBC: 4.25 MIL/uL (ref 3.80–5.20)
RDW: 13.6 % (ref 11.5–14.5)
WBC: 11.2 10*3/uL — ABNORMAL HIGH (ref 3.6–11.0)

## 2015-08-07 LAB — WET PREP, GENITAL
Clue Cells Wet Prep HPF POC: NONE SEEN
Sperm: NONE SEEN
Trich, Wet Prep: NONE SEEN
Yeast Wet Prep HPF POC: NONE SEEN

## 2015-08-07 LAB — CHLAMYDIA/NGC RT PCR (ARMC ONLY)
Chlamydia Tr: NOT DETECTED
N gonorrhoeae: NOT DETECTED

## 2015-08-07 MED ORDER — DOXYCYCLINE HYCLATE 100 MG PO CAPS: 100.0000 mg | ORAL_CAPSULE | Freq: Two times a day (BID) | ORAL | Status: DC

## 2015-08-07 MED ORDER — CLOTRIMAZOLE 1 % EX OINT: TOPICAL_OINTMENT | CUTANEOUS | Status: DC

## 2015-08-07 MED ORDER — FLUCONAZOLE 50 MG PO TABS
150.0000 mg | ORAL_TABLET | Freq: Once | ORAL | Status: AC
  Administered 2015-08-07: 150 mg via ORAL

## 2015-08-07 MED ORDER — FLUCONAZOLE 100 MG PO TABS
ORAL_TABLET | ORAL | Status: AC
  Filled 2015-08-07: qty 1

## 2015-08-07 MED ORDER — OXYCODONE-ACETAMINOPHEN 5-325 MG PO TABS
1.0000 | ORAL_TABLET | Freq: Once | ORAL | Status: AC
  Administered 2015-08-07: 1 via ORAL

## 2015-08-07 MED ORDER — DOXYCYCLINE HYCLATE 100 MG PO TABS
ORAL_TABLET | ORAL | Status: AC
  Administered 2015-08-07: 100 mg via ORAL
  Filled 2015-08-07: qty 1

## 2015-08-07 MED ORDER — FLUCONAZOLE 50 MG PO TABS
ORAL_TABLET | ORAL | Status: AC
  Administered 2015-08-07: 150 mg via ORAL
  Filled 2015-08-07: qty 1

## 2015-08-07 MED ORDER — DOXYCYCLINE HYCLATE 100 MG PO TABS
100.0000 mg | ORAL_TABLET | Freq: Once | ORAL | Status: AC
  Administered 2015-08-07: 100 mg via ORAL
  Filled 2015-08-07: qty 1

## 2015-08-07 MED ORDER — OXYCODONE-ACETAMINOPHEN 5-325 MG PO TABS
ORAL_TABLET | ORAL | Status: AC
  Administered 2015-08-07: 1 via ORAL
  Filled 2015-08-07: qty 1

## 2015-08-07 NOTE — ED Notes (Signed)
Pt complains of vaginal bleeding for 6 months and a red raised rash to vaginal area that is also on lower abdomen and also on legs.

## 2015-08-07 NOTE — ED Notes (Signed)
Patient transported to Ultrasound at this time. 

## 2015-08-07 NOTE — ED Notes (Addendum)
Pt states she has had abnormal periods x6-7 months with clotting. Pt states the last 7 days she has noticed a rash , c/o itching, burning and swelling that is moving towards her abdomen. Pt states she is going through about 2 pads/hr.Pt denies any discharge. Pt has redness around umbilical. Pt has hx of MRSA

## 2015-08-07 NOTE — Discharge Instructions (Signed)

## 2015-08-07 NOTE — ED Provider Notes (Signed)
[facility] Emergency Department Provider Note   ____________________________________________  Time seen: Approximately 2:10 PM  I have reviewed the triage vital signs and the nursing notes.   HISTORY  Chief Complaint Vaginal Bleeding   HPI [name] is a 40 y.o. female with a history of a uterine fibroid as well as multiple abdominal surgeries with small bowel obstructions was presenting with 6 months of vaginal bleeding. She says that the bleeding often produces clots. Denies any feelings of lightheadedness or passing out. Says that the bleeding is also associated with cramping to the lower abdomen especially to the left lower quadrant. Denies any vaginal trauma. Says that she has always had "heavy periods." Also with rash around the vagina which is painful. Says that she is also concerned about swollen lymph nodes on her neck. She also has a small amount of erythema around her bellybutton and says she has had a similar episode in the past which was diagnosed as MRSA. Has been using nystatin cream to the rash in her groin without improvement. Says the rash has been ongoing over the past 7 days. Has an appointment with her OB/GYN on July 3 but says she could not wait that long.   Past Medical History  Diagnosis Date  . Anxiety   . PTSD (post-traumatic stress disorder)   . Depression   . Diabetes mellitus, type II ([place])   . Seizures ([place])   . Headache   . Neuropathy [facility])     Patient Active Problem List   Diagnosis Date Noted  . Abdominal pain, generalized 03/27/2015  . Degeneration of intervertebral disc of lumbar region 12/18/2014  . Abnormal ECG 10/09/2014  . Achilles tendinitis 10/09/2014  . Anxiety 10/09/2014  . DDD (degenerative disc disease), lumbar 10/09/2014  . Clinical depression 10/09/2014  . Class 1 obesity 10/09/2014  . Family planning 06/06/2014  . Fibroid 06/06/2014  . Bariatric surgery status 06/06/2014  . Avitaminosis D  04/22/2014  . Achilles bursitis 04/12/2014  . DDD (degenerative disc disease), lumbosacral 04/12/2014  . LBP (low back pain) 04/12/2014  . Abdominal pain, right upper quadrant 04/12/2014  . Degeneration of intervertebral disc of lumbosacral region 04/12/2014  . Diabetes mellitus arising in pregnancy 04/01/2014  . Personal history of surgery to heart and great vessels, presenting hazards to health 04/01/2014  . Other specified postprocedural states 04/01/2014  . Episode of syncope 02/04/2014  . Breathlessness on exertion 02/04/2014  . Awareness of heartbeats 12/11/2013  . Cardiomyopathy ([place]) 10/29/2013  . Dizziness 10/29/2013  . Mixed incontinence 08/31/2012  . Adiposity 08/31/2012  . Chronic pain associated with significant psychosocial dysfunction 05/12/2012  . Disorder of sacrum 05/12/2012  . Arthropathy of lumbar facet joint 05/12/2012  . Lumbar and sacral osteoarthritis 05/12/2012  . Arthralgia, sacroiliac 05/12/2012    Past Surgical History  Procedure Laterality Date  . Gastric bypass    . Cholecystectomy    . Hernia repair      Current Outpatient Rx  Name  Route  Sig  Dispense  Refill  . acetaminophen (TYLENOL) 325 MG tablet               . ALPRAZolam (XANAX) 1 MG tablet   Oral   Take 1 tablet (1 mg total) by mouth daily as needed for anxiety.   30 tablet   4     FOR PANIC ATTACKS. This is used as needed. Physici ...   . buPROPion (WELLBUTRIN XL) 150 MG 24 hr tablet  Oral   Take 3 tablets (450 mg total) by mouth daily.   90 tablet   2   . calcium carbonate (OS-CAL - DOSED IN MG OF ELEMENTAL CALCIUM) 1250 (500 CA) MG tablet      Frequency:PHARMDIR   Dosage:0.0     Instructions:  Note:4 days a week Dose: 50000 INT UNITS         . clonazePAM (KLONOPIN) 1 MG tablet   Oral   Take 1 tablet (1 mg total) by mouth 3 (three) times daily.   90 tablet   4   . cyanocobalamin (,VITAMIN B-12,) 1000 MCG/ML injection   Intramuscular   Inject into the muscle.          . diclofenac sodium (VOLTAREN) 1 % GEL      APPLY 4 GMS EVERY 6 HOURS AS NEEDED FOR PAIN         . GLUCAGON EMERGENCY 1 MG injection      INJECT 1 MG INTO THE MUSCLE ONCE AS NEEDED FOR LOW BLOOD SUGAR.      11     Dispense as written.   . Insulin Syringe-Needle U-100 (INSULIN SYRINGE .5CC/31GX5/16") 31G X 5/16" 0.5 ML MISC               . pantoprazole (PROTONIX) 40 MG tablet      TAKE 1 TABLET (40 MG TOTAL) BY MOUTH EVERY 12 (TWELVE) HOURS.      8   . sertraline (ZOLOFT) 100 MG tablet      Take one and half  tablet daily.   45 tablet   2   . sucralfate (CARAFATE) 1 G tablet      TAKE 1 TABLET (1 G TOTAL) BY MOUTH 4 (FOUR) TIMES DAILY BEFORE MEALS AND NIGHTLY.      7   . SUMAtriptan (IMITREX) 100 MG tablet   Oral   Take by mouth.         . topiramate (TOPAMAX) 25 MG tablet   Oral   Take by mouth.         . traMADol (ULTRAM) 50 MG tablet      TAKE 1 TO 2 TABLETS EVERY 4 HOURS AS NEEDED FOR PAIN NO MORE THAN 8 TABS IN 24 HOURS      0   . zolpidem (AMBIEN) 10 MG tablet   Oral   Take 1 tablet (10 mg total) by mouth at bedtime as needed for sleep.   30 tablet   2     Allergies Amoxicillin; Penicillins; Morphine; and Sulfa antibiotics  Family History  Problem Relation Age of Onset  . Arthritis/Rheumatoid Mother   . Heart block Mother   . Clotting disorder Mother   . Osteoporosis Mother   . Heart attack Mother   . Anxiety disorder Mother   . Depression Mother   . Heart disease Father   . Heart attack Father   . Depression Sister   . Anxiety disorder Sister     Social History Social History  Substance Use Topics  . Smoking status: Never Smoker   . Smokeless tobacco: Never Used  . Alcohol Use: No    Review of Systems Constitutional: No fever/chills Eyes: No visual changes. ENT: No sore throat. Cardiovascular: Denies chest pain. Respiratory: Denies shortness of breath. Gastrointestinal:  No nausea, no vomiting.  No  diarrhea.  No constipation. Genitourinary: Notes burning with urination. Musculoskeletal: Negative for back pain. Skin: Negative for rash. Neurological: Negative for headaches, focal weakness or  numbness.  10-point ROS otherwise negative.  ____________________________________________   PHYSICAL EXAM:  VITAL SIGNS: ED Triage Vitals  Enc Vitals Group     BP 08/07/15 1216 107/78 mmHg     Pulse Rate 08/07/15 1216 112     Resp 08/07/15 1216 18     Temp 08/07/15 1216 98.6 F (37 C)     Temp Source 08/07/15 1216 Oral     SpO2 08/07/15 1216 99 %     Weight 08/07/15 1216 164 lb (74.39 kg)     Height 08/07/15 1216 5\' 7"  (1.702 m)     Head Cir --      Peak Flow --      Pain Score 08/07/15 1223 8     Pain Loc --      Pain Edu? --      Excl. in [place]? --     Constitutional: Alert and oriented. Well appearing and in no acute distress. Eyes: Conjunctivae are normal. PERRL. EOMI. Head: Atraumatic. Nose: No congestion/rhinnorhea. Mouth/Throat: Mucous membranes are moist.   Neck: No stridor.   Hematological/Lymphatic/Immunilogical: Bilateral tender swollen anterior cervical lymph nodes. Cardiovascular: Normal rate, regular rhythm. Grossly normal heart sounds.  Good peripheral circulation. Respiratory: Normal respiratory effort.  No retractions. Lungs CTAB. Gastrointestinal: Soft with mild suprapubic as well as left lower quadrant tenderness palpation. No distention.  Genitourinary: External exam with blood at the introitus. No active bleeding. Speculum exam with maroon blood in the vault. No active bleeding from the cervix. Trace pooling. No clots. Bimanual exam with a mild amount of CMT. Mild uterine tenderness but with left adnexal tenderness to palpation and a possible mass. Musculoskeletal: No lower extremity tenderness nor edema.  No joint effusions. Neurologic:  Normal speech and language. No gross focal neurologic deficits are appreciated. No gait instability. Skin:  Erythema to the  vulvovaginal area extending up to the mons and just touching the lower abdomen. It also extends to the thighs. There are scattered petechiae to the proximal thighs bilaterally. The rash is nonblanching. There is no pus. There appear to be several small pustules consistent with folliculitis. Also with a very small amount of erythema surrounding the umbilicus. No pus. Mildly tender. Psychiatric: Mood and affect are normal. Speech and behavior are normal.  ____________________________________________   LABS (all labs ordered are listed, but only abnormal results are displayed)  Labs Reviewed  WET PREP, GENITAL - Abnormal; Notable for the following:    WBC, Wet Prep HPF POC FEW (*)    All other components within normal limits  COMPREHENSIVE METABOLIC PANEL - Abnormal; Notable for the following:    CO2 20 (*)    All other components within normal limits  CBC - Abnormal; Notable for the following:    WBC 11.2 (*)    All other components within normal limits  URINALYSIS COMPLETEWITH MICROSCOPIC ([facility] ONLY) - Abnormal; Notable for the following:    Color, Urine RED (*)    APPearance CLOUDY (*)    Glucose, UA   (*)    Value: TEST NOT REPORTED DUE TO COLOR INTERFERENCE OF URINE PIGMENT   Bilirubin Urine   (*)    Value: TEST NOT REPORTED DUE TO COLOR INTERFERENCE OF URINE PIGMENT   Ketones, ur   (*)    Value: TEST NOT REPORTED DUE TO COLOR INTERFERENCE OF URINE PIGMENT   Specific Gravity, Urine 1.036 (*)    Hgb urine dipstick   (*)    Value: TEST NOT REPORTED DUE TO COLOR INTERFERENCE  OF URINE PIGMENT   Protein, ur   (*)    Value: TEST NOT REPORTED DUE TO COLOR INTERFERENCE OF URINE PIGMENT   Nitrite   (*)    Value: TEST NOT REPORTED DUE TO COLOR INTERFERENCE OF URINE PIGMENT   Leukocytes, UA   (*)    Value: TEST NOT REPORTED DUE TO COLOR INTERFERENCE OF URINE PIGMENT   Bacteria, UA FEW (*)    Squamous Epithelial / LPF 6-30 (*)    All other components within normal limits  DIFFERENTIAL -  Abnormal; Notable for the following:    Neutro Abs 9.5 (*)    All other components within normal limits  CHLAMYDIA/NGC RT PCR ([facility] ONLY)  HCG, QUANTITATIVE, PREGNANCY  CBC WITH DIFFERENTIAL/PLATELET   ____________________________________________  EKG   ____________________________________________  RADIOLOGY   [place] Art/Ven Flow Abd Pelv Doppler (Final result) Result time: 08/07/15 17:32:33   Final result by Rad Results In Interface (08/07/15 17:32:33)   Narrative:   CLINICAL DATA: Left pelvic pain and vaginal bleeding since November 2016. Fibroids removed 2015.  EXAM: TRANSABDOMINAL AND TRANSVAGINAL ULTRASOUND OF PELVIS  DOPPLER ULTRASOUND OF OVARIES  TECHNIQUE: Both transabdominal and transvaginal ultrasound examinations of the pelvis were performed. Transabdominal technique was performed for global imaging of the pelvis including uterus, ovaries, adnexal regions, and pelvic cul-de-sac.  It was necessary to proceed with endovaginal exam following the transabdominal exam to visualize the endometrium and ovaries. Color and duplex Doppler ultrasound was utilized to evaluate blood flow to the ovaries.  COMPARISON: CT 11/27/2014  FINDINGS: Uterus  Measurements: 5.5 x 5.9 x 9.9 cm. No fibroids or other mass visualized. Several nabothian cysts over the cervix.  Endometrium  Thickness: 3.3 mm. No focal abnormality visualized.  Right ovary  Measurements: 1.6 x 2.5 x 2.8 cm. Normal appearance/no adnexal mass.  Left ovary  Measurements: 4.4 x 2.4 x 6.2 cm. Complex cystic mass measuring 5.0 x 5.8 x 4.6 cm with multiple thin septations suggesting a stairstep pattern as this is likely a hemorrhagic cyst and less likely endometrioma, abscess or neoplasm. Position of left ovary is posterior to the uterus.  Pulsed Doppler evaluation of both ovaries demonstrates normal low-resistance arterial and venous waveforms.  Other findings  Small amount of free  fluid.  IMPRESSION: Left ovarian complex cystic mass measuring 4.6 x 5.0 x 5.8 cm likely a hemorrhagic cyst. This could also represent an endometrioma, tubo-ovarian abscess and less likely neoplasm. No evidence of ovarian torsion. Small amount of free pelvic fluid. Recommend follow-up pelvic ultrasound 6 weeks.  Normal uterus and right ovary.   Electronically Signed By: Marin Olp M.D. On: 08/07/2015 17:32    ____________________________________________   PROCEDURES   ____________________________________________   INITIAL IMPRESSION / ASSESSMENT AND PLAN / ED COURSE  Pertinent labs & imaging results that were available during my care of the patient were reviewed by me and considered in my medical decision making (see chart for details).  Patient with likely Candida infection of the groin. Likely secondary to prolonged bleeding with environment fever bowl to Candida infection. Suspect possible superinfection. We'll treat with antifungals topically as well as a dose of Diflucan here in the emergency department. Unclear cause of the cervical lymphadenopathy. Pending differential.  ----------------------------------------- 5:51 PM on 08/07/2015 -----------------------------------------  Patient with left complex cystic structure which is likely a hemorrhagic cyst. Feel that the rest of the differential given by the radiologist is less likely clinically. Patient negative for gonorrhea and chlamydia so feel that likely much less the probability  of a tubo-ovarian abscess. Patient has follow-up on July 3 with the [facility] clinic OB[facility]GYN. We'll continue pain medication as home. We'll give Percocet here prior to discharge. Will also treat with doxycycline as well as topical azole for her skin infection. ____________________________________________   FINAL CLINICAL IMPRESSION(S) / ED DIAGNOSES  Final diagnoses:  Female pelvic pain  Female pelvic pain  Left ovarian cyst. Tinea  cruris. Cellulitis. Dysfunctional uterine bleeding.    NEW MEDICATIONS STARTED DURING THIS VISIT:  New Prescriptions   No medications on file     Note:  This document was prepared using Dragon voice recognition software and may include unintentional dictation errors.    Orbie Pyo, MD 08/07/15 [phone]

## 2015-08-14 ENCOUNTER — Ambulatory Visit: Payer: Medicare Other | Admitting: Psychiatry

## 2015-08-27 ENCOUNTER — Ambulatory Visit (INDEPENDENT_AMBULATORY_CARE_PROVIDER_SITE_OTHER): Payer: Medicare Other | Admitting: Psychiatry

## 2015-08-27 DIAGNOSIS — F410 Panic disorder [episodic paroxysmal anxiety] without agoraphobia: Secondary | ICD-10-CM

## 2015-08-27 DIAGNOSIS — F331 Major depressive disorder, recurrent, moderate: Secondary | ICD-10-CM

## 2015-08-27 MED ORDER — SERTRALINE HCL 100 MG PO TABS: ORAL_TABLET | ORAL | Status: DC

## 2015-08-27 MED ORDER — BUPROPION HCL ER (XL) 150 MG PO TB24: 450.0000 mg | ORAL_TABLET | Freq: Every day | ORAL | Status: DC

## 2015-08-27 NOTE — Progress Notes (Signed)
Patient ID: [name], female   DOB: March 13, 1975, 40 y.o.   MRN: [id number]  [facility] MD/PA/NP OP Progress Note  08/27/2015 11:19 AM TACARI STEADMAN  MRN:  [id number]  Subjective:  Patient returns for follow-up for major depressive disorder and panic disorder. She has tapered the klonopin to 1mg  in the morning daily, she also takes Xanax 1mg  when she has a panic attack. She states that she has been having frequent panic attacks, takes xanax 1mg  3 to 4 times a week. states she has an upcoming hysterectomy procedure on the 14th of August. patient reports being very stressed about this procedure. States that they found a new growth on her ovary and there is also be quite bright and her doctor wants to make sure that down these tumors are not cancerous. However reports that she had 5 previous abdominal surgeries during one of which she collapsed and was in the ICU for 3 weeks. States that the doctor said to Refuse to operate on her since she is high risk and a doctor at [facility] is operating on her.   She brings her 53-month-old daughter today and states that she enjoys spending time with her. She has been sleeping okay and eating okay.  She denies any suicidal thoughts currently  Chief Complaint: Increased panic attacks  Visit Diagnosis:     ICD-9-CM ICD-10-CM   1. Major depressive disorder, recurrent episode, moderate (HCC) 296.32 F33.1   2. Panic disorder 300.01 F41.0     Past Medical History:  Past Medical History  Diagnosis Date  . Anxiety   . PTSD (post-traumatic stress disorder)   . Depression   . Diabetes mellitus, type II ([facility])   . Seizures ([facility])   . Headache   . Neuropathy [facility])     Past Surgical History  Procedure Laterality Date  . Gastric bypass    . Cholecystectomy    . Hernia repair     Family History:  Family History  Problem Relation Age of Onset  . Arthritis/Rheumatoid Mother   . Heart block Mother   . Clotting disorder Mother   . Osteoporosis  Mother   . Heart attack Mother   . Anxiety disorder Mother   . Depression Mother   . Heart disease Father   . Heart attack Father   . Depression Sister   . Anxiety disorder Sister    Social History:  Social History   Social History  . Marital Status: Married    Spouse Name: N/A  . Number of Children: N/A  . Years of Education: N/A   Social History Main Topics  . Smoking status: Never Smoker   . Smokeless tobacco: Never Used  . Alcohol Use: No  . Drug Use: No  . Sexual Activity: Yes    Birth Control/ Protection: Injection   Other Topics Concern  . Not on file   Social History Narrative   Additional History:   Assessment:   Musculoskeletal: Strength & Muscle Tone: within normal limits Gait & Station: normal Patient leans: N/A  Psychiatric Specialty Exam: HPI  Review of Systems  Psychiatric/Behavioral: Positive for depression. Negative for suicidal ideas, hallucinations, memory loss and substance abuse. The patient is nervous/anxious (excessive thoughts about harm coming to relatives, comments or parents are getting older). The patient does not have insomnia.   All other systems reviewed and are negative.   There were no vitals taken for this visit.There is no weight on file to calculate BMI.  General Appearance:  Well Groomed  Eye Contact:  Good  Speech:  Normal Rate  Volume:  Normal  Mood:  a little better  Affect:  Anxious   Thought Process:  Linear and Logical  Orientation:  Full (Time, Place, and Person)  Thought Content:  Negative  Suicidal Thoughts:  No  Homicidal Thoughts:  No  Memory:  Immediate;   Good Recent;   Good Remote;   Good  Judgement:  Good  Insight:  Good  Psychomotor Activity:  Negative  Concentration:  Good  Recall:  Good  Fund of Knowledge: Good  Language: Good  Akathisia:  Negative  Handed:  Right  AIMS (if indicated):  N/A  Assets:  Communication Skills Desire for Improvement Social Support  ADL's:  Intact  Cognition: WNL   Sleep:  Good with Ambien   Is the patient at risk to self?  No. Has the patient been a risk to self in the past 6 months?  No. Has the patient been a risk to self within the distant past?  No. Is the patient a risk to others?  No. Has the patient been a risk to others in the past 6 months?  No. Has the patient been a risk to others within the distant past?  No.  Current Medications: Current Outpatient Prescriptions  Medication Sig Dispense Refill  . acetaminophen (TYLENOL) 325 MG tablet     . ALPRAZolam (XANAX) 1 MG tablet Take 1 tablet (1 mg total) by mouth daily as needed for anxiety. 30 tablet 4  . buPROPion (WELLBUTRIN XL) 150 MG 24 hr tablet Take 3 tablets (450 mg total) by mouth daily. 90 tablet 2  . calcium carbonate (OS-CAL - DOSED IN MG OF ELEMENTAL CALCIUM) 1250 (500 CA) MG tablet Frequency:PHARMDIR   Dosage:0.0     Instructions:  Note:4 days a week Dose: 50000 INT UNITS    . clonazePAM (KLONOPIN) 1 MG tablet Take 1 tablet (1 mg total) by mouth 3 (three) times daily. 90 tablet 4  . Clotrimazole 1 % OINT Apply to affected area twice daily for 4 weeks. 56.7 g 0  . cyanocobalamin (,VITAMIN B-12,) 1000 MCG/ML injection Inject into the muscle.    . diclofenac sodium (VOLTAREN) 1 % GEL APPLY 4 GMS EVERY 6 HOURS AS NEEDED FOR PAIN    . doxycycline (VIBRAMYCIN) 100 MG capsule Take 1 capsule (100 mg total) by mouth 2 (two) times daily. 20 capsule 0  . GLUCAGON EMERGENCY 1 MG injection INJECT 1 MG INTO THE MUSCLE ONCE AS NEEDED FOR LOW BLOOD SUGAR.  11  . Insulin Syringe-Needle U-100 (INSULIN SYRINGE .5CC/31GX5/16") 31G X 5/16" 0.5 ML MISC     . pantoprazole (PROTONIX) 40 MG tablet TAKE 1 TABLET (40 MG TOTAL) BY MOUTH EVERY 12 (TWELVE) HOURS.  8  . sertraline (ZOLOFT) 100 MG tablet Take one and half  tablet daily. 45 tablet 2  . sucralfate (CARAFATE) 1 G tablet TAKE 1 TABLET (1 G TOTAL) BY MOUTH 4 (FOUR) TIMES DAILY BEFORE MEALS AND NIGHTLY.  7  . SUMAtriptan (IMITREX) 100 MG tablet Take  by mouth.    . topiramate (TOPAMAX) 25 MG tablet Take by mouth.    . traMADol (ULTRAM) 50 MG tablet TAKE 1 TO 2 TABLETS EVERY 4 HOURS AS NEEDED FOR PAIN NO MORE THAN 8 TABS IN 24 HOURS  0  . zolpidem (AMBIEN) 10 MG tablet Take 1 tablet (10 mg total) by mouth at bedtime as needed for sleep. 30 tablet 2   No current  facility-administered medications for this visit.    Medical Decision Making:  Established Problem, Stable/Improving (1), Review of Medication Regimen & Side Effects (2) and Review of New Medication or Change in Dosage (2)  Treatment Plan Summary:Medication management and Plan   Major depressive disorder, recurrent, moderate- Continue Wellbutrin XL 450 mg.  Increase sertraline to 200 mg daily Patient urged to start seeing Felecia Jan again and discuss her current concerns and anxieties.  Insomnia- continue Ambien 10 mg at bedtime as needed.  Panic disorder without or phobia- take Klonopin 1 mg as needed for panic attacks. She and recommended to stop using the Xanax that she has been doing 3 times a week and just take the Klonopin.  Patient will follow up in 2 months. She's been encouraged call any questions or concerns prior to her next appointment.   Chioke Noxon 08/27/2015, 11:19 AM

## 2015-09-09 NOTE — H&P (Addendum)
History of Present Illness:    [name] is a 40 y.o. here to evaluate abnormal bleeding. No LMP recorded (lmp unknown). Patient is not currently having periods (Reason: Other). [name]  Hx of fibroids- laprascopic myomectomy in 2015 with [facility] Dr. Melford Aase. The next year, she was pregnant and got care at [facility] for [place],   04/02/14- NSVD of last child at [facility]  Now is having bleeding x7 months- visited ER last week for rash and heavy bleeding. Lots of clots. Bleeding that filled pads everyday for 37months. Treated with doxycycline from the ER. Is having vaginitis - with itching and burning vaginally.  Stopped bleeding 3 days ago.  U/S in the ER found a likely hemorrhagic cyst on the oeft ovary measuring 4x6cm, multiple thin septations. The ER doctor told her this might be cancer and she is very worried about it.  Hx of gastric bypass in 2009 - gastric bleeding requiring emergency laparotomy with an ICU stay of 3 1/2 weeks, and 4 more surgeries in 2011.  Pt desires sterilization  Last pap smear 2015- neg per patient report  She does have a hx of the following:    * Bleeding disorder: feels that she had a hx of easy bruising, + bleeding with dental work, + bleeding with dental work, occasional nosebleeds.  INR: 1.1, PT 12.5. Plts normal.  *   NONE: PCOS: AUB with onset of menses, acne, hirsutism, diabetes in family  NONE: Liver/renal/systemic anovulation  She has not had a workup in the past. Prior studies: ultrasound .  Ultrasound: abnormal  right ovarian cyst, - hemorrhagic SIS: none  EMB: negative for malignancy.  Previous treatment(s): none  She declines IUD, implants and OCPs. Hx of anxiety and depression. She very much wants a hysterectomy or BTL.   Health care maintenance:                Last Pap smear: 2015 neg  Contraception: abstinence   Review of Systems:  See HPI  History reviewed in detail and updated: Past Gynecological History: As per  HPI.  Past Obstetric History:  OB History    Gravida Para Term Preterm AB TAB SAB Ectopic Multiple Living   2 2 2  0     0 1      Past Medical History:      Past Medical History:  Diagnosis Date  . Abnormal ECG   . Anemia, unspecified    Last blood transfusion 05/2012.   [name] Anxiety, unspecified   . Arthritis 1999  . B12 deficiency   . Cardiomyopathy, secondary (CMS-HCC)   . Chronic abdominal pain, unspecified   . DDD (degenerative disc disease)   . Depression, unspecified   . Diabetes mellitus type 2, uncomplicated (CMS-HCC)    Type 2- on Insulin  . GERD (gastroesophageal reflux disease)   . Iron deficiency   . Obesity, unspecified   . Ovarian cyst 2009  . PTSD (post-traumatic stress disorder), unspecified   . Rh negative status during pregnancy   . Small bowel obstruction (CMS-HCC)     Past Surgical History:        Past Surgical History:  Procedure Laterality Date  . CHOLECYSTECTOMY  2013  . DILATION AND CURETTAGE OF UTERUS  06/21/2013   w/polypectomy  . EGD  05/08/2013  . EGD N/A 05/06/2014   Procedure: EGD;  Surgeon: Norva Pavlov, DO;  [facility];  Service: General Surgery;  Laterality: N/A;  . laparoscopic ovarian cystectomy and hysteroscopic polypectomy  2015  .  ROUX-EN-Y PROCEDURE  2009  . Surgery for a bowel obstruction  2011    Past Family History: family history includes Arthritis in her brother, father, maternal grandmother, mother, paternal grandmother, and sister; Asthma in her maternal grandmother and sister; Breast cancer in her other and paternal grandmother; Colon polyps in her mother, other, and sister; Depression in her father, maternal grandmother, mother, paternal grandmother, and sister; Heart attack in her father, mother, and paternal grandmother; Heart disease in her paternal grandmother; Hypertension in her father and paternal grandmother; Lung cancer in her maternal grandmother and other; Ovarian  cancer (age of onset: 68) in her maternal grandmother; Pulmonary embolism in her mother; Stroke in her father.  and she denies any female cancers, bleeding or blood clotting disorders.   Past Social History:   SocialHistory   Social History        Social History  . Marital status: Single    Spouse name: N/A  . Number of children: N/A  . Years of education: N/A      Occupational History  . Not on file.          Social History Main Topics  . Smoking status: Never Smoker  . Smokeless tobacco: Not on file  . Alcohol use Yes      Comment:  1-4 /week   . Drug use: No  . Sexual activity: Not Currently    Partners: Male    Birth control/ protection: None       Other Topics Concern  . Not on file   Social History Narrative     Allergies: Amoxicillin; Penicillins; Morphine; and Sulfa (sulfonamide antibiotics) Medications:        Current Outpatient Prescriptions on File Prior to Visit  Medication Sig Dispense Refill  . acetaminophen (TYLENOL) 325 MG tablet     . ALPRAZolam (XANAX) 1 MG tablet Take 1 mg by mouth.    [name] buPROPion HCl 450 mg Tb24 Take 150 mg by mouth 3 (three) times daily.     . clonazePAM (KLONOPIN) 1 MG tablet Take 1 mg by mouth 3 (three) times daily.     . cyanocobalamin (VITAMIN B12) 1,000 mcg/mL injection Inject 1 mL (1,000 mcg total) into the muscle monthly. 1 mL 11  . docusate (COLACE) 100 MG capsule Take 100 mg by mouth 2 (two) times daily.    . pantoprazole (PROTONIX) 40 MG DR tablet     . sertraline (ZOLOFT) 100 MG tablet Take one half a tablet in the morning for one week then increase to one whole tablet in the morning.    . sucralfate (CARAFATE) 1 gram tablet     . SUMAtriptan (IMITREX) 100 MG tablet Take 100 mg by mouth once as needed for Migraine. May take a second dose after 2 hours if needed.    . topiramate (TOPAMAX) 50 MG tablet Take 1 tablet (50 mg total) by mouth 2 (two) times daily. 60 tablet 5  . traMADol  (ULTRAM) 50 mg tablet TAKE 1 OR 2 TABLETS BY MOUTH EVERY 4 HOURS AS NEEDED FOR PAIN *MAX OF 8 PER 24 HOURS*  0  . VOLTAREN 1 % topical gel APPLY 4 GMS EVERY 6 HOURS AS NEEDED FOR PAIN  0  . zolpidem (AMBIEN) 10 mg tablet Take 10 mg by mouth nightly as needed for Sleep.    . folic acid (FOLVITE) 1 MG tablet Take by mouth.    . thiamine mononitrate, vit B1, (VITAMIN B-1, MONONITRATE,) 100 mg Tab tablet Take by  mouth.     No current facility-administered medications on file prior to visit.      Detailed physical exam performed: Vital Signs:     Vitals:   08/18/15 1413  BP: 96/64  Pulse: 89  Weight: 70.8 kg (156 lb)  Height: 170.2 cm (5\' 7" )   General: Well appearing female in no apparent distress. CV: RRR Pulm: CTAB Extremities: No edema or varicosities Abdomen:  Soft, nontender, without hepatosplenomegaly or masses.  No hernias. Lymphatic: No inguinal adenopathy. Back/spine:  Normal range of motion, no kyphosis, no CVA tenderness. Tenderness to palpation in lower back. Skin:  No rashes, ulcers or skin lesions noted.  No excessive hirsutism or acne noted.  Neurological:  Appears alert and oriented and is a good historian.  No gross abnormalities are noted. Psychological:  Normal affect and mood.  No signs of anxiety or depression noted.  Pelvic:                        External genitalia: vulva and labia without lesions, Tanner stage 5                       Urethra: no prolapse                       Vagina: normal physiologic d/c                       Cervix: no lesions, no cervical motion tenderness                         Uterus: normal size shape and contour, non-tender                       Adnexa: no mass,  non-tender                         Rectovaginal: external exam normal   Endometrial biopsy: The cervix was cleaned with betadine, topical Hurriciane spray applied, and a single tooth tenaculum is applied to the anterior cervix. The Pipelle catheter was  placed into the endometrial cavity. It sounds to 8 cm and adequate tissue was removed.    Impression:   The primary encounter diagnosis was Abnormal uterine bleeding (AUB), unspecified. A diagnosis of Adnexal mass was also pertinent to this visit.    Plan:    Abnormal uterine bleeding: The patient presents today with abnormal uterine bleeding. She has a normal exam, with no evidence of lesions or cervical infection.   She may have a bleeding disorder, given hx, though labs are reassuring. We will be aware during surgery and will transfuse products if needed.  She is interested in total vaginal hysterectomy with bilateral salpingectomy. We have ordered a CA125 for reassurance, and I will plan on ovarian cystectomy vs oopherectomy at the time of surgery. She confirms that she would rather have an open procedure than not have it completed.   She is aware that her surgery is more risky because of her past surgical hx with multiple abdominal surgeries. She is at risk of bladder and bowel injury and bleeding more than other people, and she is aware and definitively requests this surgeyr.  We will plan to proceed with surgical treatment of her AUB by total vaginal hysterectomy with bilateral salpingectomy procedure.   The patient and I discussed  the technical aspects of the procedure including the potential for risks and complications. These include but are not limited to the risk of infection requiring post-operative antibiotics or further procedures. We talked about the risk of injury to adjacent organs including bladder, bowel, ureter, blood vessels or nerves. We talked about the need to convert to an open incision. We talked about the possible need for blood transfusion. We talked aboutpostop complications such asthromboembolic or cardiopulmonary complications. All of her questions were answered.  Her preoperative exam was completed and the appropriate consents were signed.  She is scheduled to undergo this procedure in the near future.  Specific Peri-operative Considerations:  - Consent: obtained today - Health Maintenance: Pap uptodate - Labs: CBC, CMP preoperatively - Studies: EKG, CXR preoperatively - not required - Bowel Preparation: None required - Abx:  Gent/Clinda - VTE ppx: SCDs perioperatively - Glucose Protocol: not required - Beta-blockade: not required

## 2015-09-16 ENCOUNTER — Encounter
Admission: RE | Admit: 2015-09-16 | Discharge: 2015-09-16 | Disposition: A | Discharge status: Home / Self Care | Payer: Medicare Other | Source: Ambulatory Visit | Attending: Obstetrics and Gynecology | Admitting: Obstetrics and Gynecology

## 2015-09-16 DIAGNOSIS — Z01812 Encounter for preprocedural laboratory examination: Secondary | ICD-10-CM | POA: Diagnosis present

## 2015-09-16 DIAGNOSIS — Z01810 Encounter for preprocedural cardiovascular examination: Secondary | ICD-10-CM | POA: Insufficient documentation

## 2015-09-16 HISTORY — DX: Pneumonia, unspecified organism: J18.9

## 2015-09-16 HISTORY — DX: Adverse effect of unspecified anesthetic, initial encounter: T41.45XA

## 2015-09-16 HISTORY — DX: Other complications of anesthesia, initial encounter: T88.59XA

## 2015-09-16 LAB — TYPE AND SCREEN
ABO/RH(D): A NEG
Antibody Screen: NEGATIVE

## 2015-09-16 LAB — CBC
HCT: 38.3 % (ref 35.0–47.0)
Hemoglobin: 13 g/dL (ref 12.0–16.0)
MCH: 29.8 pg (ref 26.0–34.0)
MCHC: 33.8 g/dL (ref 32.0–36.0)
MCV: 88 fL (ref 80.0–100.0)
Platelets: 289 10*3/uL (ref 150–440)
RBC: 4.36 MIL/uL (ref 3.80–5.20)
RDW: 13.8 % (ref 11.5–14.5)
WBC: 7.6 10*3/uL (ref 3.6–11.0)

## 2015-09-16 LAB — BASIC METABOLIC PANEL
Anion gap: 6 (ref 5–15)
BUN: 13 mg/dL (ref 6–20)
CO2: 20 mmol/L — ABNORMAL LOW (ref 22–32)
Calcium: 9.3 mg/dL (ref 8.9–10.3)
Chloride: 112 mmol/L — ABNORMAL HIGH (ref 101–111)
Creatinine, Ser: 0.7 mg/dL (ref 0.44–1.00)
GFR calc Af Amer: >60 mL/min (ref >60)
GFR calc non Af Amer: >60 mL/min (ref >60)
Glucose, Bld: 95 mg/dL (ref 65–99)
Potassium: 3.4 mmol/L — ABNORMAL LOW (ref 3.5–5.1)
Sodium: 138 mmol/L (ref 135–145)

## 2015-09-16 LAB — MRSA PCR SCREENING: MRSA by PCR: NEGATIVE

## 2015-09-16 MED ORDER — GENTAMICIN SULFATE 40 MG/ML IJ SOLN: INTRAMUSCULAR | Status: DC

## 2015-09-16 NOTE — Patient Instructions (Signed)
Your procedure is scheduled [id number], 2017 Su procedimiento est programado para: Report to [place] a: To find out your arrival time please call [phone] between [place] on Friday September 26, 2015 Para saber su hora de llegada por favor llame al ([place]:  Remember: Instructions that are not followed completely may result in serious medical risk, up to and including death, or upon the discretion of your surgeon and anesthesiologist your surgery may need to be rescheduled.  Recuerde: Las instrucciones que no se siguen completamente Heritage manager en un riesgo de salud grave, incluyendo hasta la [place] o a discrecin de su cirujano y Environmental health practitioner, su ciruga se puede posponer.   _X___ 1. Do not eat food or drink liquids after midnight. No gum chewing or hard candies.  No coma alimentos ni tome lquidos despus de la medianoche.  No mastique chicle ni caramelos  duros.     _X___ 2. No alcohol for 24 hours before or after surgery.    No tome alcohol durante las 24 horas antes ni despus de la [place].   ____ 3. Bring all medications with you on the day of surgery if instructed.    Lleve todos los medicamentos con usted el da de su ciruga si se le ha indicado as.   __X__ 4. Notify your doctor if there is any change in your medical condition (cold, fever,                             infections).    Informe a su mdico si hay algn cambio en su condicin mdica (resfriado, fiebre, infecciones).   Do not wear jewelry, make-up, hairpins, clips or nail polish.  No use joyas, maquillajes, pinzas/ganchos para el cabello ni esmalte de uas.  Do not wear lotions, powders, or perfumes. You may wear deodorant.  No use lociones, polvos o perfumes.  Puede usar desodorante.    Do not shave 48 hours prior to surgery. Men may shave face and neck.  No se afeite 48 horas antes de la [place].  Los hombres pueden [employer] cara y el  cuello.   Do not bring valuables to the hospital.   No lleve objetos [place] is not responsible for any belongings or valuables.  [place] no se hace responsable de ningn tipo de pertenencias u objetos de Geographical information systems officer.               Contacts, dentures or bridgework may not be worn into surgery.  Los lentes de [place], las dentaduras postizas o puentes no se pueden usar en la [place].  Leave your suitcase in the car. After surgery it may be brought to your room.  Deje su maleta en el auto.  Despus de la ciruga podr traerla a su habitacin.  For patients admitted to the hospital, discharge time is determined by your treatment team.  Para los pacientes que sean ingresados al hospital, el tiempo en el cual se le dar de alta es determinado por su                equipo de [place].   Patients discharged the day of surgery will not be allowed to drive home. A los pacientes que se les da de alta el mismo da de la ciruga no se les permitir conducir a [place] representative.   Please read over the following  fact sheets that you were given: Por favor [place] informacin que le dieron:   MRSA Information              CHG INSTRUCTIONS  ____ Take these medicines the morning of surgery with A SIP OF WATER:          [employer] estas medicinas la maana de la ciruga con UN SORBO DE AGUA:  1.XANAX IF NEEDED  2. PANTOPRAZOLE DOSE AT NIGHT AND ONE MORNING OF SURGERY  3. SERTRALINE  4. TOPIRAMATE       5.  6.  ____ Fleet Enema (as directed)          Enema de Fleet (segn lo indicado)    __X__ Use CHG Soap as directed          Utilice el jabn de CHG segn lo indicado  ____ Use inhalers on the day of surgery          Use los inhaladores el da de la ciruga  ____ Stop metformin 2 days prior to surgery          Deje de tomar el metformin 2 das antes de la ciruga    ____ Take 1/2 of usual insulin dose the night before surgery and none on the morning of surgery            Tome la mitad de la dosis habitual de insulina la noche antes de la [place] y no tome nada en la maana de la             ciruga  ____ Stop Coumadin/Plavix/aspirin on          Deje de tomar el Coumadin/Plavix/aspirina el da:  _X___ Stop Anti-inflammatories on   - DO NOT TAKE --TAKE TYLENOL          Deje de tomar antiinflamatorios el da:   ____ Stop supplements until after surgery            Deje de tomar suplementos hasta despus de la ciruga  ____ Bring C-Pap to the hospital          [place] al hospital

## 2015-09-23 NOTE — Pre-Procedure Instructions (Signed)
Spoke with Dr. Rosey Bath regarding read EKG by Dr. Fletcher Anon: Normal sinus rhythm Nonspecific T wave abnormality Abnormal ECG When compared with ECG of 27-Nov-2014 02:36, Nonspecific T wave abnormality, worse in Inferior leads Nonspecific T wave abnormality now evident in Anterior leads Confirmed by ARIDA MD, [facility] ([id number]) on 09/19/2015 8:01:25 AM  Reviewed pt history, OK to proceed.

## 2015-09-29 ENCOUNTER — Encounter: Payer: Self-pay | Admitting: *Deleted

## 2015-09-29 ENCOUNTER — Other Ambulatory Visit: Payer: Self-pay

## 2015-09-29 ENCOUNTER — Observation Stay
Admission: RE | Admit: 2015-09-29 | Discharge: 2015-10-02 | Disposition: A | Discharge status: Home / Self Care | Payer: Medicare Other | Source: Ambulatory Visit | Attending: Obstetrics and Gynecology | Admitting: Obstetrics and Gynecology

## 2015-09-29 ENCOUNTER — Inpatient Hospital Stay: Payer: Medicare Other | Admitting: Anesthesiology

## 2015-09-29 ENCOUNTER — Encounter
Admission: RE | Disposition: A | Discharge status: Home / Self Care | Payer: Self-pay | Source: Ambulatory Visit | Attending: Obstetrics and Gynecology

## 2015-09-29 DIAGNOSIS — Z880 Allergy status to penicillin: Secondary | ICD-10-CM | POA: Insufficient documentation

## 2015-09-29 DIAGNOSIS — Z8371 Family history of colonic polyps: Secondary | ICD-10-CM | POA: Insufficient documentation

## 2015-09-29 DIAGNOSIS — Z791 Long term (current) use of non-steroidal anti-inflammatories (NSAID): Secondary | ICD-10-CM | POA: Diagnosis not present

## 2015-09-29 DIAGNOSIS — K219 Gastro-esophageal reflux disease without esophagitis: Secondary | ICD-10-CM | POA: Insufficient documentation

## 2015-09-29 DIAGNOSIS — R569 Unspecified convulsions: Secondary | ICD-10-CM | POA: Insufficient documentation

## 2015-09-29 DIAGNOSIS — Z794 Long term (current) use of insulin: Secondary | ICD-10-CM | POA: Diagnosis not present

## 2015-09-29 DIAGNOSIS — E1140 Type 2 diabetes mellitus with diabetic neuropathy, unspecified: Secondary | ICD-10-CM | POA: Insufficient documentation

## 2015-09-29 DIAGNOSIS — Z881 Allergy status to other antibiotic agents status: Secondary | ICD-10-CM | POA: Diagnosis not present

## 2015-09-29 DIAGNOSIS — D259 Leiomyoma of uterus, unspecified: Secondary | ICD-10-CM | POA: Diagnosis present

## 2015-09-29 DIAGNOSIS — N939 Abnormal uterine and vaginal bleeding, unspecified: Secondary | ICD-10-CM | POA: Diagnosis present

## 2015-09-29 DIAGNOSIS — Z8249 Family history of ischemic heart disease and other diseases of the circulatory system: Secondary | ICD-10-CM | POA: Insufficient documentation

## 2015-09-29 DIAGNOSIS — F419 Anxiety disorder, unspecified: Secondary | ICD-10-CM | POA: Insufficient documentation

## 2015-09-29 DIAGNOSIS — F4310 Post-traumatic stress disorder, unspecified: Secondary | ICD-10-CM | POA: Insufficient documentation

## 2015-09-29 DIAGNOSIS — Z882 Allergy status to sulfonamides status: Secondary | ICD-10-CM | POA: Diagnosis not present

## 2015-09-29 DIAGNOSIS — Z9049 Acquired absence of other specified parts of digestive tract: Secondary | ICD-10-CM | POA: Insufficient documentation

## 2015-09-29 DIAGNOSIS — E538 Deficiency of other specified B group vitamins: Secondary | ICD-10-CM | POA: Insufficient documentation

## 2015-09-29 DIAGNOSIS — Z818 Family history of other mental and behavioral disorders: Secondary | ICD-10-CM | POA: Insufficient documentation

## 2015-09-29 DIAGNOSIS — Z823 Family history of stroke: Secondary | ICD-10-CM | POA: Insufficient documentation

## 2015-09-29 DIAGNOSIS — Z8701 Personal history of pneumonia (recurrent): Secondary | ICD-10-CM | POA: Diagnosis not present

## 2015-09-29 DIAGNOSIS — Z885 Allergy status to narcotic agent status: Secondary | ICD-10-CM | POA: Diagnosis not present

## 2015-09-29 DIAGNOSIS — D62 Acute posthemorrhagic anemia: Secondary | ICD-10-CM | POA: Insufficient documentation

## 2015-09-29 DIAGNOSIS — Z79899 Other long term (current) drug therapy: Secondary | ICD-10-CM | POA: Insufficient documentation

## 2015-09-29 DIAGNOSIS — Z8041 Family history of malignant neoplasm of ovary: Secondary | ICD-10-CM | POA: Insufficient documentation

## 2015-09-29 DIAGNOSIS — N83202 Unspecified ovarian cyst, left side: Secondary | ICD-10-CM | POA: Diagnosis present

## 2015-09-29 DIAGNOSIS — I428 Other cardiomyopathies: Secondary | ICD-10-CM | POA: Insufficient documentation

## 2015-09-29 DIAGNOSIS — N8312 Corpus luteum cyst of left ovary: Secondary | ICD-10-CM | POA: Diagnosis not present

## 2015-09-29 DIAGNOSIS — Z825 Family history of asthma and other chronic lower respiratory diseases: Secondary | ICD-10-CM | POA: Insufficient documentation

## 2015-09-29 DIAGNOSIS — Z803 Family history of malignant neoplasm of breast: Secondary | ICD-10-CM | POA: Insufficient documentation

## 2015-09-29 DIAGNOSIS — Z9889 Other specified postprocedural states: Secondary | ICD-10-CM | POA: Insufficient documentation

## 2015-09-29 DIAGNOSIS — N800 Endometriosis of uterus: Secondary | ICD-10-CM | POA: Diagnosis not present

## 2015-09-29 DIAGNOSIS — Z801 Family history of malignant neoplasm of trachea, bronchus and lung: Secondary | ICD-10-CM | POA: Insufficient documentation

## 2015-09-29 DIAGNOSIS — F329 Major depressive disorder, single episode, unspecified: Secondary | ICD-10-CM | POA: Diagnosis not present

## 2015-09-29 DIAGNOSIS — Z8261 Family history of arthritis: Secondary | ICD-10-CM | POA: Insufficient documentation

## 2015-09-29 DIAGNOSIS — N72 Inflammatory disease of cervix uteri: Secondary | ICD-10-CM | POA: Diagnosis not present

## 2015-09-29 HISTORY — PX: VAGINAL HYSTERECTOMY: SHX2639

## 2015-09-29 HISTORY — PX: BILATERAL SALPINGECTOMY: SHX5743

## 2015-09-29 HISTORY — PX: OVARIAN CYST REMOVAL: SHX89

## 2015-09-29 LAB — POCT PREGNANCY, URINE: Preg Test, Ur: NEGATIVE

## 2015-09-29 SURGERY — HYSTERECTOMY, VAGINAL
Anesthesia: General

## 2015-09-29 MED ORDER — MIDAZOLAM HCL 2 MG/2ML IJ SOLN
INTRAMUSCULAR | Status: DC | PRN
  Administered 2015-09-29 (×2): 2 mg via INTRAVENOUS

## 2015-09-29 MED ORDER — DIPHENHYDRAMINE HCL 50 MG/ML IJ SOLN
12.5000 mg | Freq: Four times a day (QID) | INTRAMUSCULAR | Status: DC | PRN
Start: 2015-09-29 — End: 2015-09-29

## 2015-09-29 MED ORDER — LACTATED RINGERS IV SOLN
INTRAVENOUS | Status: DC
  Administered 2015-09-29 (×2): via INTRAVENOUS

## 2015-09-29 MED ORDER — SUGAMMADEX SODIUM 500 MG/5ML IV SOLN
INTRAVENOUS | Status: DC | PRN
  Administered 2015-09-29: 136 mg via INTRAVENOUS

## 2015-09-29 MED ORDER — DEXMEDETOMIDINE HCL 200 MCG/2ML IV SOLN
INTRAVENOUS | Status: DC | PRN
  Administered 2015-09-29: 12 ug via INTRAVENOUS

## 2015-09-29 MED ORDER — ALPRAZOLAM 1 MG PO TABS: 1.0000 mg | ORAL_TABLET | Freq: Every day | ORAL | Status: DC | PRN

## 2015-09-29 MED ORDER — BUPROPION HCL ER (XL) 300 MG PO TB24
450.0000 mg | ORAL_TABLET | Freq: Every day | ORAL | Status: DC
  Administered 2015-09-29 – 2015-10-02 (×4): 450 mg via ORAL
  Filled 2015-09-29 (×6): qty 1

## 2015-09-29 MED ORDER — PROMETHAZINE HCL 25 MG/ML IJ SOLN: 6.2500 mg | INTRAMUSCULAR | Status: DC | PRN

## 2015-09-29 MED ORDER — ZOLPIDEM TARTRATE 5 MG PO TABS
5.0000 mg | ORAL_TABLET | Freq: Every evening | ORAL | Status: DC | PRN
  Administered 2015-09-29 – 2015-10-01 (×3): 5 mg via ORAL
  Filled 2015-09-29 (×3): qty 1

## 2015-09-29 MED ORDER — LACTATED RINGERS IV SOLN
INTRAVENOUS | Status: DC
  Administered 2015-09-29 (×2): via INTRAVENOUS

## 2015-09-29 MED ORDER — TOPIRAMATE 25 MG PO TABS
75.0000 mg | ORAL_TABLET | Freq: Two times a day (BID) | ORAL | Status: DC
  Administered 2015-09-29 – 2015-10-02 (×6): 75 mg via ORAL
  Filled 2015-09-29 (×8): qty 3

## 2015-09-29 MED ORDER — SODIUM CHLORIDE 0.9% FLUSH: 9.0000 mL | INTRAVENOUS | Status: DC | PRN

## 2015-09-29 MED ORDER — NALOXONE HCL 0.4 MG/ML IJ SOLN
0.4000 mg | INTRAMUSCULAR | Status: DC | PRN
Start: 2015-09-29 — End: 2015-09-29

## 2015-09-29 MED ORDER — GENTAMICIN SULFATE 40 MG/ML IJ SOLN
5.0000 mg/kg | Freq: Once | INTRAVENOUS | Status: DC
  Filled 2015-09-29: qty 8.5

## 2015-09-29 MED ORDER — NALOXONE HCL 0.4 MG/ML IJ SOLN: 0.4000 mg | INTRAMUSCULAR | Status: DC | PRN

## 2015-09-29 MED ORDER — LIDOCAINE HCL (CARDIAC) 20 MG/ML IV SOLN
INTRAVENOUS | Status: DC | PRN
  Administered 2015-09-29: 100 mg via INTRAVENOUS

## 2015-09-29 MED ORDER — DIPHENHYDRAMINE HCL 50 MG/ML IJ SOLN: 12.5000 mg | Freq: Four times a day (QID) | INTRAMUSCULAR | Status: DC | PRN

## 2015-09-29 MED ORDER — HYDROMORPHONE 1 MG/ML IV SOLN: INTRAVENOUS | Status: DC

## 2015-09-29 MED ORDER — LIDOCAINE-EPINEPHRINE 1 %-1:100000 IJ SOLN
INTRAMUSCULAR | Status: DC | PRN
  Administered 2015-09-29: 20 mL

## 2015-09-29 MED ORDER — PROPOFOL 10 MG/ML IV BOLUS
INTRAVENOUS | Status: DC | PRN
  Administered 2015-09-29: 200 mg via INTRAVENOUS

## 2015-09-29 MED ORDER — CLINDAMYCIN PHOSPHATE 900 MG/50ML IV SOLN
900.0000 mg | Freq: Once | INTRAVENOUS | Status: AC
  Administered 2015-09-29: 900 mg via INTRAVENOUS

## 2015-09-29 MED ORDER — KETOROLAC TROMETHAMINE 30 MG/ML IJ SOLN
30.0000 mg | Freq: Once | INTRAMUSCULAR | Status: AC
  Administered 2015-09-29: 30 mg via INTRAVENOUS
  Filled 2015-09-29: qty 1

## 2015-09-29 MED ORDER — KETOROLAC TROMETHAMINE 30 MG/ML IJ SOLN
INTRAMUSCULAR | Status: DC | PRN
  Administered 2015-09-29: 30 mg via INTRAVENOUS

## 2015-09-29 MED ORDER — HYDROMORPHONE 1 MG/ML IV SOLN
INTRAVENOUS | Status: DC
  Administered 2015-09-29: 23:00:00 via INTRAVENOUS
  Administered 2015-09-30: 4.5 mL via INTRAVENOUS
  Administered 2015-09-30: 9.35 mL via INTRAVENOUS
  Administered 2015-09-30: 2.5 mL via INTRAVENOUS
  Filled 2015-09-29: qty 25

## 2015-09-29 MED ORDER — MENTHOL 3 MG MT LOZG
1.0000 | LOZENGE | OROMUCOSAL | Status: DC | PRN
  Filled 2015-09-29: qty 9

## 2015-09-29 MED ORDER — ONDANSETRON HCL 4 MG PO TABS
4.0000 mg | ORAL_TABLET | Freq: Four times a day (QID) | ORAL | Status: DC | PRN
  Administered 2015-10-01: 4 mg via ORAL
  Filled 2015-09-29: qty 1

## 2015-09-29 MED ORDER — DIPHENHYDRAMINE HCL 12.5 MG/5ML PO ELIX: 12.5000 mg | ORAL_SOLUTION | Freq: Four times a day (QID) | ORAL | Status: DC | PRN

## 2015-09-29 MED ORDER — HYDROMORPHONE HCL 1 MG/ML IJ SOLN
0.2000 mg | INTRAMUSCULAR | Status: DC | PRN
  Administered 2015-09-29 (×4): 0.6 mg via INTRAVENOUS
  Filled 2015-09-29 (×4): qty 1

## 2015-09-29 MED ORDER — DIPHENHYDRAMINE HCL 12.5 MG/5ML PO ELIX
12.5000 mg | ORAL_SOLUTION | Freq: Four times a day (QID) | ORAL | Status: DC | PRN
  Filled 2015-09-29: qty 5

## 2015-09-29 MED ORDER — PHENYLEPHRINE HCL 10 MG/ML IJ SOLN
INTRAMUSCULAR | Status: DC | PRN
  Administered 2015-09-29 (×6): 100 ug via INTRAVENOUS

## 2015-09-29 MED ORDER — ONDANSETRON HCL 4 MG/2ML IJ SOLN: 4.0000 mg | Freq: Four times a day (QID) | INTRAMUSCULAR | Status: DC | PRN

## 2015-09-29 MED ORDER — IBUPROFEN 600 MG PO TABS
600.0000 mg | ORAL_TABLET | Freq: Four times a day (QID) | ORAL | Status: DC | PRN
  Administered 2015-09-30 – 2015-10-02 (×5): 600 mg via ORAL
  Filled 2015-09-29 (×5): qty 1

## 2015-09-29 MED ORDER — PANTOPRAZOLE SODIUM 40 MG PO TBEC
40.0000 mg | DELAYED_RELEASE_TABLET | Freq: Every day | ORAL | Status: DC
  Administered 2015-09-30 – 2015-10-02 (×3): 40 mg via ORAL
  Filled 2015-09-29 (×3): qty 1

## 2015-09-29 MED ORDER — FENTANYL CITRATE (PF) 100 MCG/2ML IJ SOLN
INTRAMUSCULAR | Status: AC
  Administered 2015-09-29: 25 ug via INTRAVENOUS
  Filled 2015-09-29: qty 2

## 2015-09-29 MED ORDER — HYDROMORPHONE HCL 1 MG/ML IJ SOLN
INTRAMUSCULAR | Status: DC | PRN
Start: 2015-09-29 — End: 2015-09-29
  Administered 2015-09-29 (×2): .6 mg via INTRAVENOUS

## 2015-09-29 MED ORDER — SERTRALINE HCL 100 MG PO TABS
200.0000 mg | ORAL_TABLET | Freq: Every day | ORAL | Status: DC
  Administered 2015-09-30 – 2015-10-02 (×3): 200 mg via ORAL
  Filled 2015-09-29 (×3): qty 2

## 2015-09-29 MED ORDER — DOCUSATE SODIUM 100 MG PO CAPS
100.0000 mg | ORAL_CAPSULE | Freq: Two times a day (BID) | ORAL | Status: DC
  Administered 2015-09-29 – 2015-10-02 (×7): 100 mg via ORAL
  Filled 2015-09-29 (×7): qty 1

## 2015-09-29 MED ORDER — GENTAMICIN SULFATE 40 MG/ML IJ SOLN
INTRAVENOUS | Status: DC | PRN
  Administered 2015-09-29: 340 mg via INTRAVENOUS

## 2015-09-29 MED ORDER — LORAZEPAM 2 MG/ML IJ SOLN
0.5000 mg | Freq: Once | INTRAMUSCULAR | Status: AC
  Administered 2015-09-29: 0.5 mg via INTRAVENOUS

## 2015-09-29 MED ORDER — LORAZEPAM 2 MG/ML IJ SOLN
INTRAMUSCULAR | Status: AC
Start: 2015-09-29 — End: 2015-09-29
  Administered 2015-09-29: 0.5 mg via INTRAVENOUS
  Filled 2015-09-29: qty 1

## 2015-09-29 MED ORDER — OXYCODONE-ACETAMINOPHEN 5-325 MG PO TABS
1.0000 | ORAL_TABLET | ORAL | Status: DC | PRN
  Administered 2015-09-29 – 2015-10-02 (×13): 2 via ORAL
  Filled 2015-09-29 (×12): qty 2

## 2015-09-29 MED ORDER — ONDANSETRON HCL 4 MG/2ML IJ SOLN
INTRAMUSCULAR | Status: DC | PRN
  Administered 2015-09-29: 4 mg via INTRAVENOUS

## 2015-09-29 MED ORDER — DEXAMETHASONE SODIUM PHOSPHATE 10 MG/ML IJ SOLN
INTRAMUSCULAR | Status: DC | PRN
  Administered 2015-09-29: 10 mg via INTRAVENOUS

## 2015-09-29 MED ORDER — SUMATRIPTAN SUCCINATE 100 MG PO TABS
100.0000 mg | ORAL_TABLET | ORAL | Status: DC | PRN
Start: 2015-09-29 — End: 2015-10-02
  Filled 2015-09-29: qty 1

## 2015-09-29 MED ORDER — ROCURONIUM BROMIDE 100 MG/10ML IV SOLN
INTRAVENOUS | Status: DC | PRN
  Administered 2015-09-29 (×2): 10 mg via INTRAVENOUS

## 2015-09-29 MED ORDER — CLONAZEPAM 0.5 MG PO TABS
1.0000 mg | ORAL_TABLET | Freq: Three times a day (TID) | ORAL | Status: DC
  Administered 2015-09-29 – 2015-10-02 (×9): 1 mg via ORAL
  Filled 2015-09-29 (×9): qty 2

## 2015-09-29 MED ORDER — FENTANYL CITRATE (PF) 100 MCG/2ML IJ SOLN
INTRAMUSCULAR | Status: DC | PRN
  Administered 2015-09-29: 100 ug via INTRAVENOUS
  Administered 2015-09-29: 50 ug via INTRAVENOUS

## 2015-09-29 MED ORDER — FENTANYL CITRATE (PF) 100 MCG/2ML IJ SOLN
25.0000 ug | INTRAMUSCULAR | Status: AC | PRN
  Administered 2015-09-29 (×6): 25 ug via INTRAVENOUS

## 2015-09-29 SURGICAL SUPPLY — 51 items
BAG URO DRAIN 2000ML W/SPOUT (MISCELLANEOUS) ×5 IMPLANT
CANISTER SUCT 1200ML W/VALVE (MISCELLANEOUS) ×5 IMPLANT
CATH FOLEY 2WAY  5CC 16FR (CATHETERS) ×2
CATH FOLEY 2WAY 5CC 16FR (CATHETERS) ×3
CATH TRAY 16F METER LATEX (MISCELLANEOUS) ×3 IMPLANT
CATH URTH 16FR FL 2W BLN LF (CATHETERS) ×3 IMPLANT
CHLORAPREP W/TINT 26ML (MISCELLANEOUS) ×3 IMPLANT
DRAPE LAP W/FLUID (DRAPES) ×3 IMPLANT
DRAPE PERI LITHO V/GYN (MISCELLANEOUS) ×5 IMPLANT
DRAPE SURG 17X11 SM STRL (DRAPES) ×5 IMPLANT
DRAPE UNDER BUTTOCK W/FLU (DRAPES) ×5 IMPLANT
DRSG TEGADERM 4X4.75 (GAUZE/BANDAGES/DRESSINGS) ×2 IMPLANT
DRSG TELFA 3X8 NADH (GAUZE/BANDAGES/DRESSINGS) IMPLANT
ELECT CAUTERY BLADE 6.4 (BLADE) ×5 IMPLANT
ELECT REM PT RETURN 9FT ADLT (ELECTROSURGICAL) ×5
ELECTRODE REM PT RTRN 9FT ADLT (ELECTROSURGICAL) ×3 IMPLANT
GAUZE SPONGE 4X4 12PLY STRL (GAUZE/BANDAGES/DRESSINGS) ×3 IMPLANT
GLOVE BIO SURGEON STRL SZ 6.5 (GLOVE) ×7 IMPLANT
GLOVE BIO SURGEON STRL SZ7 (GLOVE) ×11 IMPLANT
GLOVE BIO SURGEONS STRL SZ 6.5 (GLOVE) ×4
GLOVE INDICATOR 7.0 STRL GRN (GLOVE) ×11 IMPLANT
GLOVE INDICATOR 7.5 STRL GRN (GLOVE) ×11 IMPLANT
GOWN STRL REUS W/ TWL LRG LVL3 (GOWN DISPOSABLE) ×9 IMPLANT
GOWN STRL REUS W/ TWL XL LVL3 (GOWN DISPOSABLE) ×3 IMPLANT
GOWN STRL REUS W/TWL LRG LVL3 (GOWN DISPOSABLE) ×15
GOWN STRL REUS W/TWL XL LVL3 (GOWN DISPOSABLE) ×5
KIT RM TURNOVER CYSTO AR (KITS) ×5 IMPLANT
LABEL OR SOLS (LABEL) ×5 IMPLANT
NDL SAFETY 22GX1.5 (NEEDLE) ×5 IMPLANT
PACK BASIN MAJOR ARMC (MISCELLANEOUS) ×3 IMPLANT
PACK BASIN MINOR ARMC (MISCELLANEOUS) ×5 IMPLANT
PAD DRESSING TELFA 3X8 NADH (GAUZE/BANDAGES/DRESSINGS) ×3 IMPLANT
PAD OB MATERNITY 4.3X12.25 (PERSONAL CARE ITEMS) ×5 IMPLANT
PAD PREP 24X41 OB/GYN DISP (PERSONAL CARE ITEMS) ×5 IMPLANT
SOL PREP PVP 2OZ (MISCELLANEOUS) ×5
SOLUTION PREP PVP 2OZ (MISCELLANEOUS) ×3 IMPLANT
SPONGE XRAY 4X4 16PLY STRL (MISCELLANEOUS) ×5 IMPLANT
STAPLER SKIN PROX 35W (STAPLE) ×3 IMPLANT
SURGILUBE 2OZ TUBE FLIPTOP (MISCELLANEOUS) ×5 IMPLANT
SUT PDS 2-0 27IN (SUTURE) ×3 IMPLANT
SUT VIC AB 0 CT1 27 (SUTURE) ×10
SUT VIC AB 0 CT1 27XCR 8 STRN (SUTURE) ×9 IMPLANT
SUT VIC AB 0 CT1 36 (SUTURE) ×10 IMPLANT
SUT VIC AB 2-0 SH 27 (SUTURE) ×20
SUT VIC AB 2-0 SH 27XBRD (SUTURE) ×12 IMPLANT
SUT VICRYL PLUS ABS 0 54 (SUTURE) ×5 IMPLANT
SYR BULB IRRIG 60ML STRL (SYRINGE) ×3 IMPLANT
SYR CONTROL 10ML (SYRINGE) ×5 IMPLANT
SYRINGE 10CC LL (SYRINGE) ×5 IMPLANT
TRAY PREP VAG/GEN (MISCELLANEOUS) ×3 IMPLANT
WATER STERILE IRR 1000ML POUR (IV SOLUTION) ×5 IMPLANT

## 2015-09-29 NOTE — Transfer of Care (Signed)
Immediate Anesthesia Transfer of Care Note  Patient: [name]  Procedure(s) Performed: Procedure(s): HYSTERECTOMY VAGINAL (N/A) BILATERAL SALPINGECTOMY (Bilateral) OVARIAN CYSTECTOMY (Left)  Patient Location: PACU  Anesthesia Type:General  Level of Consciousness: sedated  Airway & Oxygen Therapy: Patient Spontanous Breathing and Patient connected to face mask oxygen  Post-op Assessment: Report given to RN and Post -op Vital signs reviewed and stable  Post vital signs: Reviewed and stable  Last Vitals:  Vitals:   09/29/15 0810 09/29/15 0907  BP: 102/61   Pulse: (!) 117 86  Resp: 18   Temp: 36.9 C     Last Pain:  Vitals:   09/29/15 0810  TempSrc: Oral  PainSc: 5          Complications: No apparent anesthesia complications

## 2015-09-29 NOTE — OR Nursing (Signed)
Patient is very anxious Dr.Karenz called Ativan was oredered and given will cont withpain med [phone]

## 2015-09-29 NOTE — Anesthesia Preprocedure Evaluation (Signed)
Anesthesia Evaluation  Patient identified by MRN, date of birth, ID band Patient awake    Reviewed: Allergy & Precautions, H&P , NPO status , Patient's Chart, lab work & pertinent test results, reviewed documented beta blocker date and time   History of Anesthesia Complications (+) history of anesthetic complications  Airway Mallampati: I  TM Distance: >3 FB Neck ROM: full    Dental no notable dental hx. (+) Teeth Intact   Pulmonary shortness of breath and with exertion, neg sleep apnea, pneumonia, resolved, neg COPD, neg recent URI,    Pulmonary exam normal breath sounds clear to auscultation       Cardiovascular Exercise Tolerance: Good (-) hypertension(-) angina(-) CAD, (-) Past MI, (-) Cardiac Stents and (-) CABG Normal cardiovascular exam(-) dysrhythmias + Valvular Problems/Murmurs MR  Rhythm:regular Rate:Normal     Neuro/Psych Seizures -, Well Controlled,  PSYCHIATRIC DISORDERS (Depression, anxiety, and PTSD)    GI/Hepatic Neg liver ROS, GERD  ,  Endo/Other  diabetes (gestational)  Renal/GU negative Renal ROS  negative genitourinary   Musculoskeletal   Abdominal   Peds  Hematology negative hematology ROS (+)   Anesthesia Other Findings Past Medical History: No date: Anxiety No date: Complication of anesthesia     Comment: difficulty to get sedated during endoscopy No date: Depression No date: Headache No date: Neuropathy ([place]) No date: Pneumonia     Comment: within past five years No date: PTSD (post-traumatic stress disorder) No date: Seizures (HCC)   Reproductive/Obstetrics negative OB ROS                             Anesthesia Physical Anesthesia Plan  ASA: II  Anesthesia Plan: General   Post-op Pain Management:    Induction:   Airway Management Planned:   Additional Equipment:   Intra-op Plan:   Post-operative Plan:   Informed Consent: I have reviewed the  patients History and Physical, chart, labs and discussed the procedure including the risks, benefits and alternatives for the proposed anesthesia with the patient or authorized representative who has indicated his/her understanding and acceptance.   Dental Advisory Given  Plan Discussed with: Anesthesiologist, CRNA and Surgeon  Anesthesia Plan Comments:         Anesthesia Quick Evaluation

## 2015-09-29 NOTE — Anesthesia Postprocedure Evaluation (Signed)
Anesthesia Post Note  Patient: [name]  Procedure(s) Performed: Procedure(s) (LRB): HYSTERECTOMY VAGINAL (N/A) BILATERAL SALPINGECTOMY (Bilateral) OVARIAN CYSTECTOMY (Left)  Patient location during evaluation: PACU Anesthesia Type: MAC Level of consciousness: awake and alert Pain management: pain level controlled Vital Signs Assessment: post-procedure vital signs reviewed and stable Respiratory status: spontaneous breathing, nonlabored ventilation, respiratory function stable and patient connected to nasal cannula oxygen Cardiovascular status: stable and blood pressure returned to baseline Anesthetic complications: no    Last Vitals:  Vitals:   09/29/15 1251 09/29/15 1351  BP: (!) 112/54 114/63  Pulse: 89 87  Resp: 16 16  Temp: 36.7 C 37.1 C    Last Pain:  Vitals:   09/29/15 1351  TempSrc: Oral  PainSc:                  Martha Clan

## 2015-09-29 NOTE — Interval H&P Note (Signed)
History and Physical Interval Note:  09/29/2015 9:22 AM  [name]  has presented today for surgery, with the diagnosis of AUB  (L) Ovarian Cyst  The various methods of treatment have been discussed with the patient and family. After consideration of risks, benefits and other options for treatment, the patient has consented to  Procedure(s): HYSTERECTOMY VAGINAL (N/A) BILATERAL SALPINGECTOMY (Bilateral) OOPHORECTOMY (Left) OVARIAN CYSTECTOMY (Left) LAPAROTOMY (N/A) as a surgical intervention .  The patient's history has been reviewed, patient examined, no change in status, stable for surgery.  I have reviewed the patient's chart and labs.  Questions were answered to the patient's satisfaction.     Benjaman Kindler

## 2015-09-29 NOTE — Telephone Encounter (Signed)
requested received for refill on ambien 10mg . pt was last seen on  08-27-15 next appt 10-29-15 . dr. Einar Grad patient .

## 2015-09-29 NOTE — Telephone Encounter (Signed)
Pt needs an appointment

## 2015-09-29 NOTE — Anesthesia Procedure Notes (Signed)
Procedure Name: Intubation Date/Time: 09/29/2015 9:55 AM Performed by: Justus Memory Pre-anesthesia Checklist: Patient identified, Emergency Drugs available, Suction available and Patient being monitored Patient Re-evaluated:Patient Re-evaluated prior to inductionOxygen Delivery Method: Circle system utilized Preoxygenation: Pre-oxygenation with 100% oxygen Intubation Type: IV induction, Cricoid Pressure applied and Rapid sequence Laryngoscope Size: Mac and 3 Grade View: Grade I Tube type: Oral Tube size: 7.0 mm Number of attempts: 1 Airway Equipment and Method: Patient positioned with wedge pillow and Stylet Placement Confirmation: ETT inserted through vocal cords under direct vision,  positive ETCO2,  CO2 detector and breath sounds checked- equal and bilateral Secured at: 21 cm Tube secured with: Tape Dental Injury: Teeth and Oropharynx as per pre-operative assessment

## 2015-09-29 NOTE — Telephone Encounter (Signed)
needs refill on zolpidem 10mg  pt was last seen on  08-27-15 next appt 10-29-15

## 2015-09-29 NOTE — Progress Notes (Signed)
Patient ID: [name], female   DOB: 08-12-1975, 40 y.o.   MRN: [id number] POD#0  S: tolerating reg diet. Pain still poorly controlled O:VSS Minimal vaginal bleeding Abd: Soft, not distended  A: Post op day #0 from TVH/BS and left cystectomy P:  Will trial percocet for one more dose - if not controlled with start PCA overnight.  Continue home meds

## 2015-09-29 NOTE — Interval H&P Note (Signed)
History and Physical Interval Note:  09/29/2015 9:21 AM  [name]  has presented today for surgery, with the diagnosis of AUB  (L) Ovarian Cyst  The various methods of treatment have been discussed with the patient and family. After consideration of risks, benefits and other options for treatment, the patient has consented to  Procedure(s): HYSTERECTOMY VAGINAL (N/A) BILATERAL SALPINGECTOMY (Bilateral) OOPHORECTOMY (Left) OVARIAN CYSTECTOMY (Left) LAPAROTOMY (N/A) as a surgical intervention .  The patient's history has been reviewed, patient examined, no change in status, stable for surgery.  I have reviewed the patient's chart and labs.  Questions were answered to the patient's satisfaction.     Benjaman Kindler

## 2015-09-29 NOTE — Op Note (Signed)
[name] PROCEDURE DATE: 09/29/2015  PREOPERATIVE DIAGNOSIS:   Abnormal uterine bleeding Left ovarian cyst  POSTOPERATIVE DIAGNOSIS:   Same  SURGEON:   Benjaman Kindler, M.D. ASSISTANT: Boykin Nearing, M.D. OPERATION:  Total Vaginal Hysterectomy, bilateral salpingectomy, removal of left ovarian cyst  ANESTHESIA:  General endotracheal. Anesthesiologist: Anesthesiologist: Martha Clan, MD CRNA: Justus Memory, CRNA  INDICATIONS: The patient is a 40 y.o. 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 The patient made a decision to undergo definite surgical treatment. On the preoperative visit, the risks, benefits, indications, and alternatives of the procedure were reviewed with the patient.  On the day of surgery, the risks of surgery were again discussed with the patient including but not limited to: bleeding which may require transfusion or reoperation; infection which may require antibiotics; injury to bowel, bladder, ureters or other surrounding organs; need for additional procedures; thromboembolic phenomenon, incisional problems and other postoperative/anesthesia complications. Written informed consent was obtained.    OPERATIVE FINDINGS: A 6 week size uterus with normal tubes and ovaries bilaterally.  ESTIMATED BLOOD LOSS: 125 ml FLUIDS:  1400 ml of Lactated Ringers URINE OUTPUT:  25 ml of clear yellow urine. SPECIMENS:  Uterus and cervix, bilateral tubes, and left ovarian cyst sent to pathology COMPLICATIONS:  None immediate.  DESCRIPTION OF PROCEDURE:  The patient received prophylactic intravenous antibiotics (gent/clinda) and had sequential compression devices applied to her lower extremities while in the preoperative area.    She was taken to the operating room, where she was identified by name and birth date. General anesthesia was administered and was found to be adequate.  She was placed in the dorsal lithotomy position, and was  prepped and draped in a sterile manner.  A formal time out procedure was performed with all team members present and in agreement. A Foley catheter was inserted into her bladder and attached to gravity drainage. Attention was turned to her pelvis. Of note, all sutures used in this case were 0 Vicryl unless otherwise noted.     A weighted speculum was placed in the vagina, and the anterior and posterior lips of the cervix were grasped bilaterally with thyroid tenaculums.  The cervix was then injected circumferentially with 0.25% Marcaine with epinephrine solution to maintain hemostasis.  The cervix was circumferentially incised using electrocautery, and the posterior cul-de-sac was entered sharply in the midline.   A long weighted speculum was inserted into the posterior cul-de-sac. The bladder was dissected off the pubocervical fascia anteriorly with sharp and careful blunt dissection without complication.  The anterior cul-de-sac was then entered sharply without difficulty and the bladder retracted out of the operative field behind a retractor. The Heaney clamp was then used to clamp the uterosacral ligaments on either side.  They were then cut and sutured ligated with 0 Vicryl, and the ligated uterosacral ligaments were transfixed to the ipsilateral vaginal epithelium to further support the vagina and provide hemostasis. The cardinal ligaments were then clamped, cut and ligated. The uterine vessels and broad ligaments were then serially clamped with the Heaney clamps, cut, and suture ligated on both sides. The uterus was decompressed with the bovie and increased bleeding was noted. Examination found left sidewall bleeding that was controlled with figure of 8 stiches.  Excellent hemostasis was noted at this point.    The uterus was then delivered via the posterior cul-de-sac, and the cornua were clamped with the Heaney clamps, transected, and the uterine specimen was delivered and sent to pathology. These  pedicles were then suture ligated to ensure hemostasis.   BILATERAL SALPINGECTOMY PARAGRAPH The Fallopian tubes were then identified and individually grasped with Babcock clamps. They were clamped across entirely with a Heaney clamp and free-tied, followed by suture ligation for excellent hemostasis bilaterally.  The ovaries were left intact and in place. Two visible simple-appearing cysts with multiple blood vessels crossing the exterior were noted- these were dissected out sharlyp and sent with pathology. Figure of 8 sutures were used to assure hemostasis at the cyst base. Bovie cautery was used around the edges.  After completion of the hysterectomy, all pedicles from the uterosacral ligament to the cornua were examined hemostasis was confirmed.  The peritoneum was closed in a purse string fashion with 2-0 PDS, taking care not to incorporate any intraabdominal organs in the closure.The vaginal cuff was then closed with in a running locked fashion with care given to incorporate the uterosacral pedicles bilaterally, which were also tied in the midline.  All instruments were then removed from the pelvis.  The patient tolerated the procedure well.  All instruments, needles, and sponge counts were correct x 2. The patient was taken to the recovery room in stable condition.    Angelina Pih, MD, MPH

## 2015-09-30 ENCOUNTER — Encounter: Payer: Self-pay | Admitting: Obstetrics and Gynecology

## 2015-09-30 DIAGNOSIS — N939 Abnormal uterine and vaginal bleeding, unspecified: Secondary | ICD-10-CM | POA: Diagnosis not present

## 2015-09-30 DIAGNOSIS — D259 Leiomyoma of uterus, unspecified: Secondary | ICD-10-CM | POA: Diagnosis present

## 2015-09-30 LAB — BASIC METABOLIC PANEL
Anion gap: 5 (ref 5–15)
BUN: <5 mg/dL — ABNORMAL LOW (ref 6–20)
CO2: 22 mmol/L (ref 22–32)
Calcium: 8.3 mg/dL — ABNORMAL LOW (ref 8.9–10.3)
Chloride: 112 mmol/L — ABNORMAL HIGH (ref 101–111)
Creatinine, Ser: 0.53 mg/dL (ref 0.44–1.00)
GFR calc Af Amer: >60 mL/min (ref >60)
GFR calc non Af Amer: >60 mL/min (ref >60)
Glucose, Bld: 115 mg/dL — ABNORMAL HIGH (ref 65–99)
Potassium: 3.1 mmol/L — ABNORMAL LOW (ref 3.5–5.1)
Sodium: 139 mmol/L (ref 135–145)

## 2015-09-30 LAB — URINALYSIS COMPLETE WITH MICROSCOPIC (ARMC ONLY)
Bilirubin Urine: NEGATIVE
Glucose, UA: NEGATIVE mg/dL
Ketones, ur: NEGATIVE mg/dL
Leukocytes, UA: NEGATIVE
Nitrite: NEGATIVE
Protein, ur: NEGATIVE mg/dL
Specific Gravity, Urine: 1.012 (ref 1.005–1.030)
pH: 7 (ref 5.0–8.0)

## 2015-09-30 LAB — CBC
HCT: 33.4 % — ABNORMAL LOW (ref 35.0–47.0)
Hemoglobin: 11.1 g/dL — ABNORMAL LOW (ref 12.0–16.0)
MCH: 29.3 pg (ref 26.0–34.0)
MCHC: 33.2 g/dL (ref 32.0–36.0)
MCV: 88.5 fL (ref 80.0–100.0)
Platelets: 216 10*3/uL (ref 150–440)
RBC: 3.78 MIL/uL — ABNORMAL LOW (ref 3.80–5.20)
RDW: 14.4 % (ref 11.5–14.5)
WBC: 9.3 10*3/uL (ref 3.6–11.0)

## 2015-09-30 MED ORDER — HYDROMORPHONE HCL 1 MG/ML IJ SOLN: 0.5000 mg | INTRAMUSCULAR | Status: AC

## 2015-09-30 NOTE — Care Management Obs Status (Signed)
[facility] NOTIFICATION   Patient Details  Name: [name] MRN: [id number] Date of Birth: October 07, 1975   Medicare Observation Status Notification Given:  Yes    Shelbie Ammons, RN 09/30/2015, 12:55 PM

## 2015-09-30 NOTE — Progress Notes (Signed)
Patient ID: [name], female   DOB: [birth year], 40 y.o.   MRN: [id number]  Doing well. Slept today and pain controlled all day with PCA  AF, VSS  A: POD#1 TVH/BS, cystectomy P:  D/c PCA Start po percocet with iv rescue of dilaudid prn ADAT Enc ambulation Continue IS

## 2015-09-30 NOTE — Progress Notes (Signed)
Subjective: Patient reports continued pain, improved with dilaudid PCA overnight - 0.73ml/hr basal rate and 0.2mg  q25min bolus - did not use all boluses by 1/3  Objective: I have reviewed patient's vital signs, intake and output, medications and labs.  General: alert, cooperative and appears stated age Resp: clear to auscultation bilaterally Cardio: regular rate and rhythm, S1, S2 normal, no murmur, click, rub or gallop GI: appropriately tender Extremities: extremities normal, atraumatic, no cyanosis or edema no clinical evidence of infection   Assessment/Plan: D/C PCA today and transition to oral meds after noon Advance diet D/C foley Determine inpatient status later today  LOS: 1 day    [name] 09/30/2015, 8:57 AM

## 2015-10-01 DIAGNOSIS — N939 Abnormal uterine and vaginal bleeding, unspecified: Secondary | ICD-10-CM | POA: Diagnosis not present

## 2015-10-01 LAB — CBC
HCT: 32.6 % — ABNORMAL LOW (ref 35.0–47.0)
Hemoglobin: 10.6 g/dL — ABNORMAL LOW (ref 12.0–16.0)
MCH: 29.2 pg (ref 26.0–34.0)
MCHC: 32.7 g/dL (ref 32.0–36.0)
MCV: 89.3 fL (ref 80.0–100.0)
Platelets: 183 10*3/uL (ref 150–440)
RBC: 3.65 MIL/uL — ABNORMAL LOW (ref 3.80–5.20)
RDW: 14.7 % — ABNORMAL HIGH (ref 11.5–14.5)
WBC: 1.8 10*3/uL — ABNORMAL LOW (ref 3.6–11.0)

## 2015-10-01 LAB — BASIC METABOLIC PANEL
Anion gap: 6 (ref 5–15)
BUN: 6 mg/dL (ref 6–20)
CO2: 22 mmol/L (ref 22–32)
Calcium: 8.1 mg/dL — ABNORMAL LOW (ref 8.9–10.3)
Chloride: 113 mmol/L — ABNORMAL HIGH (ref 101–111)
Creatinine, Ser: 0.56 mg/dL (ref 0.44–1.00)
GFR calc Af Amer: >60 mL/min (ref >60)
GFR calc non Af Amer: >60 mL/min (ref >60)
Glucose, Bld: 97 mg/dL (ref 65–99)
Potassium: 3.2 mmol/L — ABNORMAL LOW (ref 3.5–5.1)
Sodium: 141 mmol/L (ref 135–145)

## 2015-10-01 LAB — SURGICAL PATHOLOGY

## 2015-10-01 MED ORDER — LACTATED RINGERS IV BOLUS (SEPSIS)
500.0000 mL | Freq: Once | INTRAVENOUS | Status: AC
  Administered 2015-10-01: 500 mL via INTRAVENOUS

## 2015-10-01 NOTE — Progress Notes (Signed)
Notify kim Johnston, Rn of bp

## 2015-10-01 NOTE — Discharge Summary (Addendum)
Physician Discharge Summary  Patient ID: [name] MRN: [id number] DOB/AGE: May 19, 1975 40 y.o.  Admit date: 09/29/2015 Discharge date: 10/02/2015  Admission Diagnoses: Postop care  Discharge Diagnoses:  Active Problems:   Abnormal uterine bleeding   Uterine fibroid   Discharged Condition: good  Hospital Course:  3 Days Post-Op Subjective: The patient is doing well.  No nausea or vomiting. Pain is adequately controlled. Tolerating po  Objective: Vital signs in last 24 hours: Temp:  [97.9 F (36.6 C)-99 F (37.2 C)] 98.2 F (36.8 C) (08/17 0851) Pulse Rate:  [70-133] 74 (08/17 0851) Resp:  [8-20] 20 (08/17 0851) BP: (94-112)/(45-56) 109/51 (08/17 0851) SpO2:  [97 %-100 %] 99 % (08/17 0418)  Intake/Output  Intake/Output Summary (Last 24 hours) at 10/02/15 0852 Last data filed at 10/01/15 0926  Gross per 24 hour  Intake              240 ml  Output                0 ml  Net              240 ml    Physical Exam:  General: Alert and oriented. Fatigued CV: RRR Lungs: Clear bilaterally. GI: Soft, Nondistended. Good bowel sounds. Urine: Clear, spontaneous Extremities: Nontender, no erythema, no edema.  Lab Results:  Recent Labs  09/30/15 0530 10/01/15 0955  HGB 11.1* 10.6*  HCT 33.4* 32.6*  WBC 9.3 1.8*  PLT 216 183                 Results for orders placed or performed during the hospital encounter of 09/29/15 (from the past 24 hour(s))  CBC     Status: Abnormal   Collection Time: 10/01/15  9:55 AM  Result Value Ref Range   WBC 1.8 (L) 3.6 - 11.0 K/uL   RBC 3.65 (L) 3.80 - 5.20 MIL/uL   Hemoglobin 10.6 (L) 12.0 - 16.0 g/dL   HCT 32.6 (L) 35.0 - 47.0 %   MCV 89.3 80.0 - 100.0 fL   MCH 29.2 26.0 - 34.0 pg   MCHC 32.7 32.0 - 36.0 g/dL   RDW 14.7 (H) 11.5 - 14.5 %   Platelets 183 150 - 440 K/uL  Basic metabolic panel     Status: Abnormal   Collection Time: 10/01/15  9:55 AM  Result Value Ref Range   Sodium 141 135 - 145 mmol/L   Potassium  3.2 (L) 3.5 - 5.1 mmol/L   Chloride 113 (H) 101 - 111 mmol/L   CO2 22 22 - 32 mmol/L   Glucose, Bld 97 65 - 99 mg/dL   BUN 6 6 - 20 mg/dL   Creatinine, Ser 0.56 0.44 - 1.00 mg/dL   Calcium 8.1 (L) 8.9 - 10.3 mg/dL   GFR calc non Af Amer >60 >60 mL/min   GFR calc Af Amer >60 >60 mL/min   Anion gap 6 5 - 15    Assessment/Plan: POD# 3 s/p TVH, BS, cystectomy. Hypotension, acute blood loss anemia.  1) Ambulate, Incentive spirometry 2) Advance diet as tolerated 3) Continue oral pain medication 5) Discharge home today anticipated.   Benjaman Kindler, MD   LOS: 1 day   Benjaman Kindler 10/02/2015, 8:52 AM

## 2015-10-02 DIAGNOSIS — N939 Abnormal uterine and vaginal bleeding, unspecified: Secondary | ICD-10-CM | POA: Diagnosis not present

## 2015-10-02 MED ORDER — IBUPROFEN 800 MG PO TABS: 800.0000 mg | ORAL_TABLET | Freq: Three times a day (TID) | ORAL | 1 refills | Status: AC | PRN

## 2015-10-02 MED ORDER — OXYCODONE-ACETAMINOPHEN 5-325 MG PO TABS: 1.0000 | ORAL_TABLET | Freq: Four times a day (QID) | ORAL | 0 refills | Status: DC | PRN

## 2015-10-02 MED ORDER — DOCUSATE SODIUM 100 MG PO CAPS: 100.0000 mg | ORAL_CAPSULE | Freq: Every day | ORAL | 3 refills | Status: DC | PRN

## 2015-10-02 MED ORDER — FERROUS SULFATE 325 (65 FE) MG PO TABS: 325.0000 mg | ORAL_TABLET | Freq: Two times a day (BID) | ORAL | 3 refills | Status: DC

## 2015-10-02 MED ORDER — ASCORBIC ACID 250 MG PO TABS: 250.0000 mg | ORAL_TABLET | Freq: Two times a day (BID) | ORAL | 3 refills | Status: AC

## 2015-10-02 NOTE — Progress Notes (Signed)
Benjaman Kindler, MD Physician Signed Obstetrics/Gynecology  Discharge Summaries Date of Service: 10/01/2015 8:46 AM    Expand All Collapse All   Patient ID: LORRIANN GASCHLER MRN: [id number] DOB/AGE: 1975/11/16 40 y.o.  Admit date: 09/29/2015  Discharge Diagnoses:  Active Problems:   Abnormal uterine bleeding   Uterine fibroid   Discharged Condition: good  Hospital Course:  2 Days Post-Op Subjective: The patient is doing well.  No nausea or vomiting. Pain is adequately controlled. She is fatigued out of proportion to my expectation and hypotensive.   Objective: Vital signs in last 24 hours: Temp:  [98.4 F (36.9 C)-99.2 F (37.3 C)] 98.5 F (36.9 C) (08/16 0750) Pulse Rate:  [72-103] 103 (08/16 0750) Resp:  [16-20] 16 (08/16 0750) BP: (89-110)/(44-72) 89/48 (08/16 0750) SpO2:  [95 %-98 %] 97 % (08/16 0750)  Intake/Output  Intake/Output Summary (Last 24 hours) at 10/01/15 0847 Last data filed at 09/30/15 1315  Gross per 24 hour  Intake                0 ml  Output             1200 ml  Net            -1200 ml    Physical Exam:  General: Alert and oriented. Fatigued CV: RRR Lungs: Clear bilaterally. GI: Soft, Nondistended. Good bowel sounds. Urine: Clear, spontaneous Extremities: Nontender, no erythema, no edema.  Lab Results:  Recent Labs (last 2 labs)    Recent Labs  09/30/15 0530  HGB 11.1*  HCT 33.4*  WBC 9.3  PLT 216                   Lab Results Last 24 Hours  No results found for this or any previous visit (from the past 24 hour(s)).    Assessment/Plan: POD# 2 s/p TVH, BS, cystectomy. Hypotension, acute blood loss anemia.  - CBC today - orthostatic pressures. - fluid bolus  1) Ambulate, Incentive spirometry 2) Advance diet as tolerated 3) Continue oral pain medication 5) Discharge home today anticipated. Will reevaluate.   Addendum: after evening evaluation, pt still orthostatic. Decision to d/c home on 8/17 - see  next note   Benjaman Kindler, MD   LOS: 1 day   Benjaman Kindler 10/01/2015, 8:47 AM      Routing History

## 2015-10-02 NOTE — Progress Notes (Signed)
MD order for pt d/c home.  Pt given all d/c instructions and understands all. Pt d/cd home via wheelchair by auxillary. Rx given to pt.

## 2015-10-02 NOTE — Discharge Instructions (Signed)
Hysterectomy Information  A hysterectomy is a surgery in which your uterus is removed. This surgery may be done to treat various medical problems. After the surgery, you will no longer have menstrual periods. The surgery will also make you unable to become pregnant (sterile). The fallopian tubes and ovaries can be removed (bilateral salpingo-oophorectomy) during this surgery as well.  REASONS FOR A HYSTERECTOMY  Persistent, abnormal bleeding.  Lasting (chronic) pelvic pain or infection.  The lining of the uterus (endometrium) starts growing outside the uterus (endometriosis).  The endometrium starts growing in the muscle of the uterus (adenomyosis).  The uterus falls down into the vagina (pelvic organ prolapse).  Noncancerous growths in the uterus (uterine fibroids) that cause symptoms.  Precancerous cells.  Cervical cancer or uterine cancer. TYPES OF HYSTERECTOMIES  Supracervical hysterectomy--In this type, the top part of the uterus is removed, but not the cervix.  Total hysterectomy--The uterus and cervix are removed.  Radical hysterectomy--The uterus, the cervix, and the fibrous tissue that holds the uterus in place in the pelvis (parametrium) are removed. WAYS A HYSTERECTOMY CAN BE PERFORMED  Abdominal hysterectomy--A large surgical cut (incision) is made in the abdomen. The uterus is removed through this incision.  Vaginal hysterectomy--An incision is made in the vagina. The uterus is removed through this incision. There are no abdominal incisions.  Conventional laparoscopic hysterectomy--Three or four small incisions are made in the abdomen. A thin, lighted tube with a camera (laparoscope) is inserted into one of the incisions. Other tools are put through the other incisions. The uterus is cut into small pieces. The small pieces are removed through the incisions, or they are removed through the vagina.  Laparoscopically assisted vaginal hysterectomy (LAVH)--Three or four  small incisions are made in the abdomen. Part of the surgery is performed laparoscopically and part vaginally. The uterus is removed through the vagina.  Robot-assisted laparoscopic hysterectomy--A laparoscope and other tools are inserted into 3 or 4 small incisions in the abdomen. A computer-controlled device is used to give the surgeon a 3D image and to help control the surgical instruments. This allows for more precise movements of surgical instruments. The uterus is cut into small pieces and removed through the incisions or removed through the vagina. RISKS AND COMPLICATIONS  Possible complications associated with this procedure include:  Bleeding and risk of blood transfusion. Tell your health care provider if you do not want to receive any blood products.  Blood clots in the legs or lung.  Infection.  Injury to surrounding organs.  Problems or side effects related to anesthesia.  Conversion to an abdominal hysterectomy from one of the other techniques. WHAT TO EXPECT AFTER A HYSTERECTOMY  You will be given pain medicine.  You will need to have someone with you for the first 3-5 days after you go home.  You will need to follow up with your surgeon in 2-4 weeks after surgery to evaluate your progress.  You may have early menopause symptoms such as hot flashes, night sweats, and insomnia.  If you had a hysterectomy for a problem that was not cancer or not a condition that could lead to cancer, then you no longer need Pap tests. However, even if you no longer need a Pap test, a regular exam is a good idea to make sure no other problems are starting.   This information is not intended to replace advice given to you by your health care provider. Make sure you discuss any questions you have with your health care  provider.   Document Released: 07/28/2000 Document Revised: 11/22/2012 Document Reviewed: 10/09/2012 Elsevier Interactive Patient Education 2016 [employer] Acetaminophen;  Oxycodone tablets What is this medicine? ACETAMINOPHEN; OXYCODONE (a set a MEE noe fen; ox i KOE done) is a pain reliever. It is used to treat moderate to severe pain. This medicine may be used for other purposes; ask your health care provider or pharmacist if you have questions. What should I tell my health care provider before I take this medicine? They need to know if you have any of these conditions: -brain tumor -Crohn's disease, inflammatory bowel disease, or ulcerative colitis -drug abuse or addiction -head injury -heart or circulation problems -if you often drink alcohol -kidney disease or problems going to the bathroom -liver disease -lung disease, asthma, or breathing problems -an unusual or allergic reaction to acetaminophen, oxycodone, other opioid analgesics, other medicines, foods, dyes, or preservatives -pregnant or trying to get pregnant -breast-feeding How should I use this medicine? Take this medicine by mouth with a full glass of water. Follow the directions on the prescription label. You can take it with or without food. If it upsets your stomach, take it with food. Take your medicine at regular intervals. Do not take it more often than directed. Talk to your pediatrician regarding the use of this medicine in children. Special care may be needed. Patients over 38 years old may have a stronger reaction and need a smaller dose. Overdosage: If you think you have taken too much of this medicine contact a poison control center or emergency room at once. NOTE: This medicine is only for you. Do not share this medicine with others. What if I miss a dose? If you miss a dose, take it as soon as you can. If it is almost time for your next dose, take only that dose. Do not take double or extra doses. What may interact with this medicine? -alcohol -antihistamines -barbiturates like amobarbital, butalbital, butabarbital, methohexital, pentobarbital, phenobarbital, thiopental, and  secobarbital -benztropine -drugs for bladder problems like solifenacin, trospium, oxybutynin, tolterodine, hyoscyamine, and methscopolamine -drugs for breathing problems like ipratropium and tiotropium -drugs for certain stomach or intestine problems like propantheline, homatropine methylbromide, glycopyrrolate, atropine, belladonna, and dicyclomine -general anesthetics like etomidate, ketamine, nitrous oxide, propofol, desflurane, enflurane, halothane, isoflurane, and sevoflurane -medicines for depression, anxiety, or psychotic disturbances -medicines for sleep -muscle relaxants -naltrexone -narcotic medicines (opiates) for pain -phenothiazines like perphenazine, thioridazine, chlorpromazine, mesoridazine, fluphenazine, prochlorperazine, promazine, and trifluoperazine -scopolamine -tramadol -trihexyphenidyl This list may not describe all possible interactions. Give your health care provider a list of all the medicines, herbs, non-prescription drugs, or dietary supplements you use. Also tell them if you smoke, drink alcohol, or use illegal drugs. Some items may interact with your medicine. What should I watch for while using this medicine? Tell your doctor or health care professional if your pain does not go away, if it gets worse, or if you have new or a different type of pain. You may develop tolerance to the medicine. Tolerance means that you will need a higher dose of the medication for pain relief. Tolerance is normal and is expected if you take this medicine for a long time. Do not suddenly stop taking your medicine because you may develop a severe reaction. Your body becomes used to the medicine. This does NOT mean you are addicted. Addiction is a behavior related to getting and using a drug for a non-medical reason. If you have pain, you have a medical reason to take pain  medicine. Your doctor will tell you how much medicine to take. If your doctor wants you to stop the medicine, the dose  will be slowly lowered over time to avoid any side effects. You may get drowsy or dizzy. Do not drive, use machinery, or do anything that needs mental alertness until you know how this medicine affects you. Do not stand or sit up quickly, especially if you are an older patient. This reduces the risk of dizzy or fainting spells. Alcohol may interfere with the effect of this medicine. Avoid alcoholic drinks. There are different types of narcotic medicines (opiates) for pain. If you take more than one type at the same time, you may have more side effects. Give your health care provider a list of all medicines you use. Your doctor will tell you how much medicine to take. Do not take more medicine than directed. Call emergency for help if you have problems breathing. The medicine will cause constipation. Try to have a bowel movement at least every 2 to 3 days. If you do not have a bowel movement for 3 days, call your doctor or health care professional. Do not take Tylenol (acetaminophen) or medicines that have acetaminophen with this medicine. Too much acetaminophen can be very dangerous. Many nonprescription medicines contain acetaminophen. Always read the labels carefully to avoid taking more acetaminophen. What side effects may I notice from receiving this medicine? Side effects that you should report to your doctor or health care professional as soon as possible: -allergic reactions like skin rash, itching or hives, swelling of the face, lips, or tongue -breathing difficulties, wheezing -confusion -light headedness or fainting spells -severe stomach pain -unusually weak or tired -yellowing of the skin or the whites of the eyes Side effects that usually do not require medical attention (report to your doctor or health care professional if they continue or are bothersome): -dizziness -drowsiness -nausea -vomiting This list may not describe all possible side effects. Call your doctor for medical  advice about side effects. You may report side effects to FDA at 1-800-FDA-1088. Where should I keep my medicine? Keep out of the reach of children. This medicine can be abused. Keep your medicine in a safe place to protect it from theft. Do not share this medicine with anyone. Selling or giving away this medicine is dangerous and against the law. This medicine may cause accidental overdose and death if it taken by other adults, children, or pets. Mix any unused medicine with a substance like cat litter or coffee grounds. Then throw the medicine away in a sealed container like a sealed bag or a coffee can with a lid. Do not use the medicine after the expiration date. Store at room temperature between 20 and 25 degrees C (68 and 77 degrees F). NOTE: This sheet is a summary. It may not cover all possible information. If you have questions about this medicine, talk to your doctor, pharmacist, or health care provider.    2016, Elsevier/Gold Standard. (2014-01-02 15:18:46) Signs and Symptoms to Report Call our office at [phone] if you have any of the following.   Fever over 100.4 degrees or higher  Severe stomach pain not relieved with pain medications  Bright red bleeding thats heavier than a period that does not slow with rest  To go the bathroom a lot (frequency), you cant hold your urine (urgency), or it hurts when you empty your bladder (urinate)  Chest pain  Shortness of breath  Pain in the calves of  your legs  Severe nausea and vomiting not relieved with anti-nausea medications  Signs of infection around your wounds, such as redness, hot to touch, swelling, green/yellow drainage (like pus), bad smelling discharge  Any concerns  What You Can Expect after Surgery  You may see some pink tinged, bloody fluid and bruising around the wound. This is normal.  You may have a sore throat because of the tube in your mouth during general anesthesia. This will go away in 2 to 3  days.  You may have some stomach cramps.  You may notice spotting on your panties.  You may have pain around the incision sites.   Activities after Your Discharge Follow these guidelines to help speed your recovery at home:  Do the coughing and deep breathing as you did in the hospital for 2 weeks. Use the small blue breathing device, called the incentive spirometer for 2 weeks.  Dont drive if you are in pain or taking narcotic pain medicine. You may drive when you can safely slam on the brakes, turn the wheel forcefully, and rotate your torso comfortably. This is typically 1-2 weeks. Practice in a parking lot or side street prior to attempting to drive regularly.   Ask others to help with household chores for 4 weeks.  Do not lift anything heavier that 10 pounds for 4-6 weeks. This includes pets, children, and groceries.  Dont do strenuous activities, exercises, or sports like vacuuming, tennis, squash, etc. until your doctor says it is safe to do so. ---If you had a hysterectomy (abdominal, laparoscopic, or vaginal) do not have intercourse for 8-10 weeks.   Walk as you feel able. Rest often since it may take two or three weeks for your energy level to return to normal.   You may climb stairs  Avoid constipation:   -Eat fruits, vegetables, and whole grains. Eat small meals as your appetite will take time to return to normal.   -Drink 6 to 8 glasses of water each day unless your doctor has told you to limit your fluids.   -Use a laxative or stool softener as needed if constipation becomes a problem. You may take Miralax, metamucil, Citrucil, Colace, Senekot, FiberCon, etc. If this does not relieve the constipation, try two tablespoons of Milk Of Magnesia every 8 hours until your bowels move.   You may shower. Gently wash the wounds with a mild soap and water. Pat dry.  Do not get in a hot tub, swimming pool, etc. until your doctor agrees.  Do not use lotions, oils, powders on  the wounds.  Do not douche, use tampons, or have sex until your doctor says it is okay.  Take your pain medicine when you need it. The medicine may not work as well if the pain is bad.  Take the medicines you were taking before surgery. Other medications you will need are pain medications (Norco or Percocet) and nausea medications (Zofran).

## 2015-10-06 ENCOUNTER — Other Ambulatory Visit: Payer: Self-pay

## 2015-10-06 NOTE — Telephone Encounter (Signed)
Pt needs refill on medications

## 2015-10-07 NOTE — Telephone Encounter (Signed)
receieved another request for refills on medication. ambien 10mg .

## 2015-10-09 ENCOUNTER — Other Ambulatory Visit: Payer: Self-pay

## 2015-10-09 NOTE — Telephone Encounter (Signed)
received fax requesting refill on zolpidem 10mg  pt has appt for sept.

## 2015-10-14 MED ORDER — ZOLPIDEM TARTRATE 10 MG PO TABS: 10.0000 mg | ORAL_TABLET | Freq: Every evening | ORAL | 0 refills | Status: DC | PRN

## 2015-10-14 NOTE — Telephone Encounter (Signed)
[name] id [id number] order # [id number]

## 2015-10-14 NOTE — Telephone Encounter (Signed)
Patient has refills on her bupropion and Zoloft. Patient was recommended multiple times to taper on her Klonopin and she had refills from her previous physician Dr. Jimmye Norman. I will not be providing her with a refill on the Klonopin. Patient has been taking Xanax also intermittently and she was told to discontinue this. I will not be refilling her Xanax. She and has been recommended multiple times to slowly taper off these medications and had enough refills from Dr. Jimmye Norman. Please tell patient that these medications will not be refilled. She will need to make an appointment to evaluate her for her mood symptoms and anxiety and manage her other medications at this time. If there is an emergency she will need to go to the emergency room.

## 2015-10-29 ENCOUNTER — Ambulatory Visit (INDEPENDENT_AMBULATORY_CARE_PROVIDER_SITE_OTHER): Payer: Medicare Other | Admitting: Psychiatry

## 2015-10-29 ENCOUNTER — Encounter: Payer: Self-pay | Admitting: Psychiatry

## 2015-10-29 VITALS — BP 109/72 | HR 99 | Temp 97.6°F | Ht 67.0 in | Wt 141.8 lb

## 2015-10-29 DIAGNOSIS — F331 Major depressive disorder, recurrent, moderate: Secondary | ICD-10-CM

## 2015-10-29 DIAGNOSIS — F410 Panic disorder [episodic paroxysmal anxiety] without agoraphobia: Secondary | ICD-10-CM

## 2015-10-29 MED ORDER — BUPROPION HCL ER (XL) 150 MG PO TB24: 450.0000 mg | ORAL_TABLET | Freq: Every day | ORAL | 2 refills | Status: DC

## 2015-10-29 MED ORDER — ZOLPIDEM TARTRATE 10 MG PO TABS: 10.0000 mg | ORAL_TABLET | Freq: Every evening | ORAL | 1 refills | Status: DC | PRN

## 2015-10-29 MED ORDER — SERTRALINE HCL 100 MG PO TABS: ORAL_TABLET | ORAL | 2 refills | Status: DC

## 2015-10-29 NOTE — Progress Notes (Signed)
Patient ID: [name], female   DOB: 14-Dec-1975, 40 y.o.   MRN: [id number]  [facility] MD/PA/NP OP Progress Note  10/29/2015 2:04 PM [name]  MRN:  [id number]  Subjective:  Patient returns for follow-up for major depressive disorder and panic disorder. Patient reports that she had her surgery and is doing well. States that she is still healing from the surgery but it went well overall. States the mood is much better. She is tolerating the increase in sertraline to 200 mg and states it has also helped. She denies any suicidal thoughts currently Patient has been able to taper off the Klonopin and Xanax completely. She reports it was difficult but she was able to do it.  Chief Complaint: doing well  Visit Diagnosis:     ICD-9-CM ICD-10-CM   1. Major depressive disorder, recurrent episode, moderate (HCC) 296.32 F33.1   2. Panic disorder 300.01 F41.0     Past Medical History:  Past Medical History:  Diagnosis Date  . Anxiety   . Complication of anesthesia    difficulty to get sedated during endoscopy  . Depression   . Headache   . Neuropathy (HCC)   . Pneumonia    within past five years  . PTSD (post-traumatic stress disorder)   . Seizures (HCC)     Past Surgical History:  Procedure Laterality Date  . ABDOMINAL HYSTERECTOMY    . BILATERAL SALPINGECTOMY Bilateral 09/29/2015   Procedure: BILATERAL SALPINGECTOMY;  Surgeon: [name], MD;  Location: [facility] ORS;  Service: Gynecology;  Laterality: Bilateral;  . bowel obstruction  2011  . CHOLECYSTECTOMY    . GASTRIC BYPASS    . gi bleed  2009   Surgery-was in ICU for three weeks  . HERNIA REPAIR    . OVARIAN CYST REMOVAL Left 09/29/2015   Procedure: OVARIAN CYSTECTOMY;  Surgeon: [name], MD;  Location: [facility] ORS;  Service: Gynecology;  Laterality: Left;  [name] VAGINAL HYSTERECTOMY N/A 09/29/2015   Procedure: HYSTERECTOMY VAGINAL;  Surgeon: [name], MD;  Location: [facility] ORS;  Service: Gynecology;  Laterality:  N/A;   Family History:  Family History  Problem Relation Age of Onset  . Arthritis/Rheumatoid Mother   . Heart block Mother   . Clotting disorder Mother   . Osteoporosis Mother   . Heart attack Mother   . Anxiety disorder Mother   . Depression Mother   . Heart disease Father   . Heart attack Father   . Depression Sister   . Anxiety disorder Sister    Social History:  Social History   Social History  . Marital status: Single    Spouse name: N/A  . Number of children: N/A  . Years of education: N/A   Social History Main Topics  . Smoking status: Never Smoker  . Smokeless tobacco: Never Used  . Alcohol use No  . Drug use: No  . Sexual activity: Yes    Birth control/ protection: Injection   Other Topics Concern  . None   Social History Narrative  . None   Additional History:   Assessment:   Musculoskeletal: Strength & Muscle Tone: within normal limits Gait & Station: normal Patient leans: N/A  Psychiatric Specialty Exam: HPI  Review of Systems  Psychiatric/Behavioral: Positive for depression. Negative for hallucinations, memory loss, substance abuse and suicidal ideas. The patient is nervous/anxious (excessive thoughts about harm coming to relatives, comments or parents are getting older). The patient does not have insomnia.   All other systems reviewed and  are negative.   Blood pressure 109/72, pulse 99, temperature 97.6 F (36.4 C), temperature source Oral, height 5\' 7"  (1.702 m), weight 141 lb 12.8 oz (64.3 kg).Body mass index is 22.21 kg/m.  General Appearance: Well Groomed  Eye Contact:  Good  Speech:  Normal Rate  Volume:  Normal  Mood:  a little better  Affect:  pleasant  Thought Process:  Linear and Logical  Orientation:  Full (Time, Place, and Person)  Thought Content:  Negative  Suicidal Thoughts:  No  Homicidal Thoughts:  No  Memory:  Immediate;   Good Recent;   Good Remote;   Good  Judgement:  Good  Insight:  Good  Psychomotor  Activity:  Negative  Concentration:  Good  Recall:  Good  Fund of Knowledge: Good  Language: Good  Akathisia:  Negative  Handed:  Right  AIMS (if indicated):  N/A  Assets:  Communication Skills Desire for Improvement Social Support  ADL's:  Intact  Cognition: WNL  Sleep:  Good with Ambien   Is the patient at risk to self?  No. Has the patient been a risk to self in the past 6 months?  No. Has the patient been a risk to self within the distant past?  No. Is the patient a risk to others?  No. Has the patient been a risk to others in the past 6 months?  No. Has the patient been a risk to others within the distant past?  No.  Current Medications: Current Outpatient Prescriptions  Medication Sig Dispense Refill  . acetaminophen (TYLENOL) 325 MG tablet Take 650 mg by mouth every 4 (four) hours as needed for moderate pain.     [name] ascorbic acid (VITAMIN C) 250 MG tablet Take 1 tablet (250 mg total) by mouth 2 (two) times daily with a meal. Take with iron for anemia 60 tablet 3  . buPROPion (WELLBUTRIN XL) 150 MG 24 hr tablet Take 3 tablets (450 mg total) by mouth daily. 90 tablet 2  . cyanocobalamin (,VITAMIN B-12,) 1000 MCG/ML injection Inject 1,000 mcg into the muscle every 30 (thirty) days.     . diclofenac sodium (VOLTAREN) 1 % GEL APPLY 4 GMS EVERY 6 HOURS AS NEEDED FOR PAIN    . docusate sodium (COLACE) 100 MG capsule Take 1 capsule (100 mg total) by mouth daily as needed for mild constipation. 60 capsule 3  . ferrous sulfate 325 (65 FE) MG tablet Take 1 tablet (325 mg total) by mouth 2 (two) times daily with a meal. For anemia, take with Vitamin C 60 tablet 3  . GLUCAGON EMERGENCY 1 MG injection INJECT 1 MG INTO THE MUSCLE ONCE AS NEEDED FOR LOW BLOOD SUGAR.  11  . Multiple Vitamins-Minerals (HM MULTIVITAMIN ADULT GUMMY) CHEW Chew 2 tablets by mouth daily.    [name] oxyCODONE-acetaminophen (PERCOCET) 5-325 MG tablet Take 1-2 tablets by mouth every 6 (six) hours as needed for severe pain.  20 tablet 0  . pantoprazole (PROTONIX) 40 MG tablet TAKE 1 TABLET (40 MG TOTAL) BY MOUTH EVERY 12 (TWELVE) HOURS.  8  . sertraline (ZOLOFT) 100 MG tablet Take 2 tablets by mouth daily. (Patient taking differently: Take 200 mg by mouth daily. ) 60 tablet 2  . sucralfate (CARAFATE) 1 G tablet TAKE 1 TABLET (1 G TOTAL) BY MOUTH 4 (FOUR) TIMES DAILY BEFORE MEALS AND NIGHTLY.  7  . SUMAtriptan (IMITREX) 100 MG tablet Take 100 mg by mouth every 2 (two) hours as needed for migraine or headache.     [name]  thiamine (VITAMIN B-1) 100 MG tablet Take 100 mg by mouth.    . topiramate (TOPAMAX) 25 MG tablet Take 75 mg by mouth 2 (two) times daily.     . traMADol (ULTRAM) 50 MG tablet TAKE 50-100 MG EVERY 4 HOURS AS NEEDED FOR PAIN NO MORE THAN 8 TABS IN 24 HOURS  0  . zolpidem (AMBIEN) 10 MG tablet Take 1 tablet (10 mg total) by mouth at bedtime as needed for sleep. 30 tablet 0   No current facility-administered medications for this visit.     Medical Decision Making:  Established Problem, Stable/Improving (1), Review of Medication Regimen & Side Effects (2) and Review of New Medication or Change in Dosage (2)  Treatment Plan Summary:Medication management and Plan   Major depressive disorder, recurrent, moderate- Continue Wellbutrin XL 450 mg.  Continue sertraline at 200 mg daily Discussed the interactions between sertraline and tramadol which could lead to serotonin syndrome and risk of seizures. However patient states that she is been doing quite well and she is also on a seizure medication for her  history of seizures.  Insomnia- continue Ambien 10 mg at bedtime as needed.  Panic disorder without or phobia- Same as above   Patient will follow up in 2 months. She's been encouraged call any questions or concerns prior to her next appointment.   Niklas Chretien 10/29/2015, 2:04 PM

## 2015-11-12 DIAGNOSIS — D509 Iron deficiency anemia, unspecified: Secondary | ICD-10-CM | POA: Insufficient documentation

## 2015-11-12 NOTE — Progress Notes (Deleted)
[place]  [phone] [phone]  ID: [name] OB: January 14, 1976  MR#: [id number]  [id number]  Patient Care Team: Leonard Downing, MD as PCP - General (Family Medicine)  CHIEF COMPLAINT: Iron deficiency anemia   INTERVAL HISTORY: Patient returns to clinic today for repeat laboratory work, further evaluation, and consideration of additional Feraheme. Her weakness and fatigue is unchanged. She has multiple other medical complaints that are also unchanged.  She has no neurologic complaints.  She has had no recent fevers or illnesses.  She denies any chest pain or shortness of breath.  She has a good appetite and denies any nausea, vomiting, constipation, or diarrhea.  She has no melena or hematochezia.  She has no urinary complaints.  Patient offers no further specific complaints today.    REVIEW OF SYSTEMS:   Review of Systems  Constitutional: Positive for malaise/fatigue. Negative for fever.  Respiratory: Negative.   Cardiovascular: Negative.   Gastrointestinal: Negative.   Musculoskeletal: Positive for back pain, joint pain and neck pain.  Neurological: Positive for weakness.  Psychiatric/Behavioral: Positive for depression.    As per HPI. Otherwise, a complete review of systems is negatve.  PAST MEDICAL HISTORY: Past Medical History:  Diagnosis Date  . Anxiety   . Complication of anesthesia    difficulty to get sedated during endoscopy  . Depression   . Headache   . Neuropathy ([place])   . Pneumonia    within past five years  . PTSD (post-traumatic stress disorder)   . Seizures ([place])     PAST SURGICAL HISTORY: Past Surgical History:  Procedure Laterality Date  . ABDOMINAL HYSTERECTOMY    . BILATERAL SALPINGECTOMY Bilateral 09/29/2015   Procedure: BILATERAL SALPINGECTOMY;  Surgeon: Benjaman Kindler, MD;  Location: [facility] OR[facility];  Service: Gynecology;  Laterality: Bilateral;  . bowel obstruction  2011  .  CHOLECYSTECTOMY    . GASTRIC BYPASS    . gi bleed  2009   Surgery-was in ICU for three weeks  . HERNIA REPAIR    . OVARIAN CYST REMOVAL Left 09/29/2015   Procedure: OVARIAN CYSTECTOMY;  Surgeon: Benjaman Kindler, MD;  Location: [facility] OR[facility];  Service: Gynecology;  Laterality: Left;  [name] VAGINAL HYSTERECTOMY N/A 09/29/2015   Procedure: HYSTERECTOMY VAGINAL;  Surgeon: Benjaman Kindler, MD;  Location: [facility] OR[facility];  Service: Gynecology;  Laterality: N/A;    FAMILY HISTORY Family History  Problem Relation Age of Onset  . Arthritis/Rheumatoid Mother   . Heart block Mother   . Clotting disorder Mother   . Osteoporosis Mother   . Heart attack Mother   . Anxiety disorder Mother   . Depression Mother   . Heart disease Father   . Heart attack Father   . Depression Sister   . Anxiety disorder Sister        ADVANCED DIRECTIVES:    HEALTH MAINTENANCE: Social History  Substance Use Topics  . Smoking status: Never Smoker  . Smokeless tobacco: Never Used  . Alcohol use No     Colonoscopy:  PAP:  Bone density:  Lipid panel:  Allergies  Allergen Reactions  . Amoxicillin Anaphylaxis, Hives, Shortness Of Breath, Swelling and Rash    Has patient had a PCN reaction causing immediate rash, facial/tongue/throat swelling, SOB or lightheadedness with hypotension: Yes Has patient had a PCN reaction causing severe rash involving mucus membranes or skin necrosis: Yes Has patient had a PCN reaction that required hospitalization No Has patient had a PCN reaction occurring within the last 10 years:  No If all of the above answers are "NO", then may proceed with Cephalosporin use. Other reaction(s): ANAPHYLAXIS Other reaction(s): ANAPHYLAX  . Penicillins Anaphylaxis, Swelling and Rash    Has patient had a PCN reaction causing immediate rash, facial/tongue/throat swelling, SOB or lightheadedness with hypotension: Yes Has patient had a PCN reaction causing severe rash involving mucus membranes or skin  necrosis: Yes Has patient had a PCN reaction that required hospitalization No Has patient had a PCN reaction occurring within the last 10 years: No If all of the above answers are "NO", then may proceed with Cephalosporin use.   [name] Morphine Other (See Comments)    Muscle spasms  . Sulfa Antibiotics     Other reaction(s): VOMITING    Current Outpatient Prescriptions  Medication Sig Dispense Refill  . acetaminophen (TYLENOL) 325 MG tablet Take 650 mg by mouth every 4 (four) hours as needed for moderate pain.     [name] ascorbic acid (VITAMIN C) 250 MG tablet Take 1 tablet (250 mg total) by mouth 2 (two) times daily with a meal. Take with iron for anemia 60 tablet 3  . buPROPion (WELLBUTRIN XL) 150 MG 24 hr tablet Take 3 tablets (450 mg total) by mouth daily. 90 tablet 2  . cyanocobalamin (,VITAMIN B-12,) 1000 MCG/ML injection Inject 1,000 mcg into the muscle every 30 (thirty) days.     . diclofenac sodium (VOLTAREN) 1 % GEL APPLY 4 GMS EVERY 6 HOURS AS NEEDED FOR PAIN    . docusate sodium (COLACE) 100 MG capsule Take 1 capsule (100 mg total) by mouth daily as needed for mild constipation. 60 capsule 3  . ferrous sulfate 325 (65 FE) MG tablet Take 1 tablet (325 mg total) by mouth 2 (two) times daily with a meal. For anemia, take with Vitamin C 60 tablet 3  . GLUCAGON EMERGENCY 1 MG injection INJECT 1 MG INTO THE MUSCLE ONCE AS NEEDED FOR LOW BLOOD SUGAR.  11  . Multiple Vitamins-Minerals (HM MULTIVITAMIN ADULT GUMMY) CHEW Chew 2 tablets by mouth daily.    [name] oxyCODONE-acetaminophen (PERCOCET) 5-325 MG tablet Take 1-2 tablets by mouth every 6 (six) hours as needed for severe pain. 20 tablet 0  . pantoprazole (PROTONIX) 40 MG tablet TAKE 1 TABLET (40 MG TOTAL) BY MOUTH EVERY 12 (TWELVE) HOURS.  8  . sertraline (ZOLOFT) 100 MG tablet Take 2 tablets by mouth daily. 60 tablet 2  . sucralfate (CARAFATE) 1 G tablet TAKE 1 TABLET (1 G TOTAL) BY MOUTH 4 (FOUR) TIMES DAILY BEFORE MEALS AND NIGHTLY.  7  .  SUMAtriptan (IMITREX) 100 MG tablet Take 100 mg by mouth every 2 (two) hours as needed for migraine or headache.     . thiamine (VITAMIN B-1) 100 MG tablet Take 100 mg by mouth.    . topiramate (TOPAMAX) 25 MG tablet Take 75 mg by mouth 2 (two) times daily.     . traMADol (ULTRAM) 50 MG tablet TAKE 50-100 MG EVERY 4 HOURS AS NEEDED FOR PAIN NO MORE THAN 8 TABS IN 24 HOURS  0  . zolpidem (AMBIEN) 10 MG tablet Take 1 tablet (10 mg total) by mouth at bedtime as needed for sleep. 30 tablet 1   No current facility-administered medications for this visit.     OBJECTIVE: There were no vitals filed for this visit.   There is no height or weight on file to calculate BMI.    ECOG FS:0 - Asymptomatic  General: Well-developed, well-nourished, no acute distress. Eyes: Pink  conjunctiva, anicteric sclera. Lungs: Clear to auscultation bilaterally. Heart: Regular rate and rhythm. No rubs, murmurs, or gallops. Abdomen: Soft, nontender, nondistended. No organomegaly noted, normoactive bowel sounds. Musculoskeletal: No edema, cyanosis, or clubbing. Neuro: Alert, answering all questions appropriately. Cranial nerves grossly intact. Skin: No rashes or petechiae noted. Psych: Normal affect.   LAB RESULTS:  Lab Results  Component Value Date   NA 141 10/01/2015   K 3.2 (L) 10/01/2015   CL 113 (H) 10/01/2015   CO2 22 10/01/2015   GLUCOSE 97 10/01/2015   BUN 6 10/01/2015   CREATININE 0.56 10/01/2015   CALCIUM 8.1 (L) 10/01/2015   PROT 7.5 08/07/2015   ALBUMIN 4.0 08/07/2015   AST 32 08/07/2015   ALT 28 08/07/2015   ALKPHOS 54 08/07/2015   BILITOT 0.5 08/07/2015   GFRNONAA >60 10/01/2015   GFRAA >60 10/01/2015    Lab Results  Component Value Date   WBC 1.8 (L) 10/01/2015   NEUTROABS 9.5 (H) 08/07/2015   HGB 10.6 (L) 10/01/2015   HCT 32.6 (L) 10/01/2015   MCV 89.3 10/01/2015   PLT 183 10/01/2015   Lab Results  Component Value Date   FERRITIN 12 05/15/2015   Lab Results  Component Value  Date   IRON 89 05/15/2015   TIBC 338 05/15/2015   IRONPCTSAT 26 05/15/2015      STUDIES: No results found.  ASSESSMENT: Iron deficiency anemia.  PLAN:    1.  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  Patient's hemoglobin and iron stores continue to be within normal limits. She last received IV iron in December 2015. No intervention is needed at this time. Return to clinic in 6 months with repeat laboratory work and further evaluation. 2. Rheumatoid arthritis: Continued workup to rheumatology. 3. Depression/anxiety:  Continue current medications as prescribed.  Patient expressed understanding and was in agreement with this plan. She also understands that She can call clinic at any time with any questions, concerns, or complaints.     Lloyd Huger, MD   11/12/2015 11:11 PM

## 2015-11-13 ENCOUNTER — Inpatient Hospital Stay: Payer: Medicare Other

## 2015-11-13 ENCOUNTER — Inpatient Hospital Stay: Payer: Medicare Other | Admitting: Oncology

## 2015-11-13 ENCOUNTER — Inpatient Hospital Stay: Payer: Medicare Other | Attending: Oncology

## 2015-11-13 DIAGNOSIS — D509 Iron deficiency anemia, unspecified: Secondary | ICD-10-CM | POA: Insufficient documentation

## 2015-11-13 DIAGNOSIS — M069 Rheumatoid arthritis, unspecified: Secondary | ICD-10-CM | POA: Insufficient documentation

## 2015-11-13 DIAGNOSIS — D649 Anemia, unspecified: Secondary | ICD-10-CM

## 2015-11-13 LAB — CBC WITH DIFFERENTIAL/PLATELET
Basophils Absolute: 0 10*3/uL (ref 0–0.1)
Basophils Relative: 0 %
Eosinophils Absolute: 0.2 10*3/uL (ref 0–0.7)
Eosinophils Relative: 2 %
HCT: 39.5 % (ref 35.0–47.0)
Hemoglobin: 13 g/dL (ref 12.0–16.0)
Lymphocytes Relative: 11 %
Lymphs Abs: 1 10*3/uL (ref 1.0–3.6)
MCH: 28.8 pg (ref 26.0–34.0)
MCHC: 33 g/dL (ref 32.0–36.0)
MCV: 87.5 fL (ref 80.0–100.0)
Monocytes Absolute: 0.5 10*3/uL (ref 0.2–0.9)
Monocytes Relative: 6 %
Neutro Abs: 6.7 10*3/uL — ABNORMAL HIGH (ref 1.4–6.5)
Neutrophils Relative %: 81 %
Platelets: 362 10*3/uL (ref 150–440)
RBC: 4.51 MIL/uL (ref 3.80–5.20)
RDW: 14.1 % (ref 11.5–14.5)
WBC: 8.4 10*3/uL (ref 3.6–11.0)

## 2015-11-13 LAB — IRON AND TIBC
Iron: 54 ug/dL (ref 28–170)
Saturation Ratios: 13 % (ref 10.4–31.8)
TIBC: 420 ug/dL (ref 250–450)
UIBC: 366 ug/dL

## 2015-11-13 LAB — FERRITIN: Ferritin: 5 ng/mL — ABNORMAL LOW (ref 11–307)

## 2015-11-14 ENCOUNTER — Other Ambulatory Visit: Payer: Self-pay | Admitting: *Deleted

## 2015-11-14 DIAGNOSIS — D509 Iron deficiency anemia, unspecified: Secondary | ICD-10-CM

## 2015-12-24 ENCOUNTER — Encounter: Payer: Self-pay | Admitting: Psychiatry

## 2015-12-24 ENCOUNTER — Ambulatory Visit (INDEPENDENT_AMBULATORY_CARE_PROVIDER_SITE_OTHER): Payer: Medicare Other | Admitting: Psychiatry

## 2015-12-24 VITALS — BP 100/62 | HR 100 | Ht 67.0 in | Wt 133.2 lb

## 2015-12-24 DIAGNOSIS — F331 Major depressive disorder, recurrent, moderate: Secondary | ICD-10-CM | POA: Diagnosis not present

## 2015-12-24 DIAGNOSIS — F410 Panic disorder [episodic paroxysmal anxiety] without agoraphobia: Secondary | ICD-10-CM | POA: Diagnosis not present

## 2015-12-24 MED ORDER — ZOLPIDEM TARTRATE 10 MG PO TABS: 10.0000 mg | ORAL_TABLET | Freq: Every evening | ORAL | 2 refills | Status: DC | PRN

## 2015-12-24 MED ORDER — SERTRALINE HCL 100 MG PO TABS: ORAL_TABLET | ORAL | 2 refills | Status: DC

## 2015-12-24 MED ORDER — BUPROPION HCL ER (XL) 150 MG PO TB24: 450.0000 mg | ORAL_TABLET | Freq: Every day | ORAL | 2 refills | Status: DC

## 2015-12-24 NOTE — Progress Notes (Signed)
Patient ID: [name], female   DOB: May 14, 1975, 40 y.o.   MRN: [id number]  [facility] MD/PA/NP OP Progress Note  12/24/2015 2:04 PM [name]  MRN:  [id number]  Subjective:  Patient returns for follow-up for major depressive disorder and panic disorder. Patient reports doing okay overall. States that the decrease in the light during the winter and does bother her little bit. States she is been losing weight though she has not been dieting. Denies any other medical problems. She is recovered well from her surgery. States her mood is doing okay overall. Denies any suicidal thoughts. Denies any particular anxiety.  Chief Complaint: doing well  Visit Diagnosis:     ICD-9-CM ICD-10-CM   1. Major depressive disorder, recurrent episode, moderate (H[facility]) 296.32 F33.1   2. Panic disorder 300.01 F41.0     Past Medical History:  Past Medical History:  Diagnosis Date  . Anxiety   . Complication of anesthesia    difficulty to get sedated during endoscopy  . Depression   . Headache   . Neuropathy ([facility])   . Pneumonia    within past five years  . PTSD (post-traumatic stress disorder)   . Seizures ([facility])     Past Surgical History:  Procedure Laterality Date  . ABDOMINAL HYSTERECTOMY    . BILATERAL SALPINGECTOMY Bilateral 09/29/2015   Procedure: BILATERAL SALPINGECTOMY;  Surgeon: [name], MD;  Location: [facility] ORS;  Service: Gynecology;  Laterality: Bilateral;  . bowel obstruction  2011  . CHOLECYSTECTOMY    . GASTRIC BYPASS    . gi bleed  2009   Surgery-was in ICU for three weeks  . HERNIA REPAIR    . OVARIAN CYST REMOVAL Left 09/29/2015   Procedure: OVARIAN CYSTECTOMY;  Surgeon: [name], MD;  Location: [facility] OR[facility];  Service: Gynecology;  Laterality: Left;  [name] VAGINAL HYSTERECTOMY N/A 09/29/2015   Procedure: HYSTERECTOMY VAGINAL;  Surgeon: [name], MD;  Location: [facility] OR[facility];  Service: Gynecology;  Laterality: N/A;   Family History:  Family History  Problem  Relation Age of Onset  . Arthritis/Rheumatoid Mother   . Heart block Mother   . Clotting disorder Mother   . Osteoporosis Mother   . Heart attack Mother   . Anxiety disorder Mother   . Depression Mother   . Heart disease Father   . Heart attack Father   . Depression Sister   . Anxiety disorder Sister    Social History:  Social History   Social History  . Marital status: Single    Spouse name: N/A  . Number of children: N/A  . Years of education: N/A   Social History Main Topics  . Smoking status: Never Smoker  . Smokeless tobacco: Never Used  . Alcohol use No  . Drug use: No  . Sexual activity: Yes    Birth control/ protection: Surgical   Other Topics Concern  . None   Social History Narrative  . None   Additional History:   Assessment:   Musculoskeletal: Strength & Muscle Tone: within normal limits Gait & Station: normal Patient leans: N/A  Psychiatric Specialty Exam: HPI  Review of Systems  Psychiatric/Behavioral: Negative for depression, hallucinations, memory loss, substance abuse and suicidal ideas. The patient does not have insomnia. Nervous/anxious: excessive thoughts about harm coming to relatives, comments or parents are getting older.   All other systems reviewed and are negative.   Blood pressure 100/62, pulse 100, height 5\' 7"  (1.702 m), weight 133 lb 3.2 oz (60.4 kg).Body mass  index is 20.86 kg/m.  General Appearance: Well Groomed  Eye Contact:  Good  Speech:  Normal Rate  Volume:  Normal  Mood:  a little better  Affect:  pleasant  Thought Process:  Linear and Logical  Orientation:  Full (Time, Place, and Person)  Thought Content:  Negative  Suicidal Thoughts:  No  Homicidal Thoughts:  No  Memory:  Immediate;   Good Recent;   Good Remote;   Good  Judgement:  Good  Insight:  Good  Psychomotor Activity:  Negative  Concentration:  Good  Recall:  Good  Fund of Knowledge: Good  Language: Good  Akathisia:  Negative  Handed:  Right   AIMS (if indicated):  N/A  Assets:  Communication Skills Desire for Improvement Social Support  ADL's:  Intact  Cognition: WNL  Sleep:  Good with Ambien   Is the patient at risk to self?  No. Has the patient been a risk to self in the past 6 months?  No. Has the patient been a risk to self within the distant past?  No. Is the patient a risk to others?  No. Has the patient been a risk to others in the past 6 months?  No. Has the patient been a risk to others within the distant past?  No.  Current Medications: Current Outpatient Prescriptions  Medication Sig Dispense Refill  . acetaminophen (TYLENOL) 325 MG tablet Take 650 mg by mouth every 4 (four) hours as needed for moderate pain.     [name] buPROPion (WELLBUTRIN XL) 150 MG 24 hr tablet Take 3 tablets (450 mg total) by mouth daily. 90 tablet 2  . cyanocobalamin (,VITAMIN B-12,) 1000 MCG/ML injection Inject 1,000 mcg into the muscle every 30 (thirty) days.     . diclofenac sodium (VOLTAREN) 1 % GEL APPLY 4 GMS EVERY 6 HOURS AS NEEDED FOR PAIN    . docusate sodium (COLACE) 100 MG capsule Take 1 capsule (100 mg total) by mouth daily as needed for mild constipation. 60 capsule 3  . ferrous sulfate 325 (65 FE) MG tablet Take 1 tablet (325 mg total) by mouth 2 (two) times daily with a meal. For anemia, take with Vitamin C 60 tablet 3  . GLUCAGON EMERGENCY 1 MG injection INJECT 1 MG INTO THE MUSCLE ONCE AS NEEDED FOR LOW BLOOD SUGAR.  11  . Multiple Vitamins-Minerals (HM MULTIVITAMIN ADULT GUMMY) CHEW Chew 2 tablets by mouth daily.    [name] oxyCODONE-acetaminophen (PERCOCET) 5-325 MG tablet Take 1-2 tablets by mouth every 6 (six) hours as needed for severe pain. 20 tablet 0  . pantoprazole (PROTONIX) 40 MG tablet TAKE 1 TABLET (40 MG TOTAL) BY MOUTH EVERY 12 (TWELVE) HOURS.  8  . sertraline (ZOLOFT) 100 MG tablet Take 2 tablets by mouth daily. 60 tablet 2  . sucralfate (CARAFATE) 1 G tablet TAKE 1 TABLET (1 G TOTAL) BY MOUTH 4 (FOUR) TIMES DAILY  BEFORE MEALS AND NIGHTLY.  7  . SUMAtriptan (IMITREX) 100 MG tablet Take 100 mg by mouth every 2 (two) hours as needed for migraine or headache.     . thiamine (VITAMIN B-1) 100 MG tablet Take 100 mg by mouth.    . topiramate (TOPAMAX) 25 MG tablet Take 75 mg by mouth 2 (two) times daily.     . traMADol (ULTRAM) 50 MG tablet TAKE 50-100 MG EVERY 4 HOURS AS NEEDED FOR PAIN NO MORE THAN 8 TABS IN 24 HOURS  0  . zolpidem (AMBIEN) 10 MG tablet Take 1  tablet (10 mg total) by mouth at bedtime as needed for sleep. 30 tablet 2   No current facility-administered medications for this visit.     Medical Decision Making:  Established Problem, Stable/Improving (1), Review of Medication Regimen & Side Effects (2) and Review of New Medication or Change in Dosage (2)  Treatment Plan Summary:Medication management and Plan   Major depressive disorder, recurrent, moderate- Continue Wellbutrin XL 450 mg.  Continue sertraline at 200 mg daily Discussed the interactions between sertraline and tramadol which could lead to serotonin syndrome and risk of seizures. However patient states that she is been doing quite well and she is also on a seizure medication for her  history of seizures. Patient currently denies using any trauma at all.  She is urged to use high strength lamps in her house to have adequate like him to help with any mood changes in the winter season   Insomnia- continue Ambien 10 mg at bedtime as needed.  Panic disorder without or phobia- Same as above   Patient will follow up in 3 months. She's been encouraged call any questions or concerns prior to her next appointment.   Ansh Fauble 12/24/2015, 2:04 PM

## 2016-01-06 ENCOUNTER — Encounter: Payer: Self-pay | Admitting: Oncology

## 2016-01-30 ENCOUNTER — Ambulatory Visit
Admission: RE | Admit: 2016-01-30 | Discharge: 2016-01-30 | Disposition: A | Discharge status: Home / Self Care | Payer: Medicare Other | Source: Ambulatory Visit | Attending: Family Medicine | Admitting: Family Medicine

## 2016-01-30 ENCOUNTER — Other Ambulatory Visit: Payer: Self-pay | Admitting: Student

## 2016-01-30 DIAGNOSIS — J189 Pneumonia, unspecified organism: Secondary | ICD-10-CM

## 2016-02-18 ENCOUNTER — Inpatient Hospital Stay: Payer: Medicare Other

## 2016-02-18 DIAGNOSIS — R42 Dizziness and giddiness: Secondary | ICD-10-CM | POA: Insufficient documentation

## 2016-02-18 DIAGNOSIS — Z79899 Other long term (current) drug therapy: Secondary | ICD-10-CM | POA: Diagnosis not present

## 2016-02-18 DIAGNOSIS — F419 Anxiety disorder, unspecified: Secondary | ICD-10-CM | POA: Insufficient documentation

## 2016-02-18 DIAGNOSIS — D509 Iron deficiency anemia, unspecified: Secondary | ICD-10-CM | POA: Diagnosis not present

## 2016-02-18 DIAGNOSIS — Z9884 Bariatric surgery status: Secondary | ICD-10-CM | POA: Diagnosis not present

## 2016-02-18 DIAGNOSIS — M069 Rheumatoid arthritis, unspecified: Secondary | ICD-10-CM | POA: Insufficient documentation

## 2016-02-18 LAB — CBC WITH DIFFERENTIAL/PLATELET
Basophils Absolute: 0 10*3/uL (ref 0–0.1)
Basophils Relative: 1 %
Eosinophils Absolute: 0.2 10*3/uL (ref 0–0.7)
Eosinophils Relative: 3 %
HCT: 37.6 % (ref 35.0–47.0)
Hemoglobin: 12.1 g/dL (ref 12.0–16.0)
Lymphocytes Relative: 22 %
Lymphs Abs: 1.2 10*3/uL (ref 1.0–3.6)
MCH: 27.7 pg (ref 26.0–34.0)
MCHC: 32.3 g/dL (ref 32.0–36.0)
MCV: 85.9 fL (ref 80.0–100.0)
Monocytes Absolute: 0.5 10*3/uL (ref 0.2–0.9)
Monocytes Relative: 9 %
Neutro Abs: 3.5 10*3/uL (ref 1.4–6.5)
Neutrophils Relative %: 65 %
Platelets: 342 10*3/uL (ref 150–440)
RBC: 4.37 MIL/uL (ref 3.80–5.20)
RDW: 14.8 % — ABNORMAL HIGH (ref 11.5–14.5)
WBC: 5.4 10*3/uL (ref 3.6–11.0)

## 2016-02-18 LAB — IRON AND TIBC
Iron: 42 ug/dL (ref 28–170)
Saturation Ratios: 9 % — ABNORMAL LOW (ref 10.4–31.8)
TIBC: 445 ug/dL (ref 250–450)
UIBC: 403 ug/dL

## 2016-02-18 LAB — FERRITIN: Ferritin: 6 ng/mL — ABNORMAL LOW (ref 11–307)

## 2016-02-18 NOTE — Progress Notes (Signed)
[place]  [phone] [phone]) [phone]  ID: [name] OB: Jul 08, 1975  MR#: [id number]  [id number]  Patient Care Team: Leonard Downing, MD as PCP - General (Family Medicine)  CHIEF COMPLAINT: Iron deficiency anemia.   INTERVAL HISTORY: Patient last evaluated over 8 months ago.  She returns to clinic today for repeat laboratory work, further evaluation, and consideration of additional Feraheme. Her weakness and fatigue is unchanged. She has multiple other medical complaints that are also unchanged.  She has occasional dizziness, but no neurologic complaints.  She has had no recent fevers or illnesses.  She denies any chest pain or shortness of breath.  She has a good appetite and denies any nausea, vomiting, constipation, or diarrhea.  She has no melena or hematochezia.  She has no urinary complaints.  Patient offers no further specific complaints today.    REVIEW OF SYSTEMS:   Review of Systems  Constitutional: Positive for malaise/fatigue. Negative for fever.  Respiratory: Negative.   Cardiovascular: Negative.   Gastrointestinal: Negative.   Musculoskeletal: Positive for back pain, joint pain and neck pain.  Neurological: Positive for dizziness and weakness.  Psychiatric/Behavioral: Positive for depression.    As per HPI. Otherwise, a complete review of systems is negative.  PAST MEDICAL HISTORY: Past Medical History:  Diagnosis Date  . Anxiety   . Complication of anesthesia    difficulty to get sedated during endoscopy  . Depression   . Headache   . Neuropathy ([place])   . Pneumonia    within past five years  . PTSD (post-traumatic stress disorder)   . Seizures ([place])     PAST SURGICAL HISTORY: Past Surgical History:  Procedure Laterality Date  . ABDOMINAL HYSTERECTOMY    . BILATERAL SALPINGECTOMY Bilateral 09/29/2015   Procedure: BILATERAL SALPINGECTOMY;  Surgeon: Benjaman Kindler, MD;  Location: [facility] ORS;  Service:  Gynecology;  Laterality: Bilateral;  . bowel obstruction  2011  . CHOLECYSTECTOMY    . GASTRIC BYPASS    . gi bleed  2009   Surgery-was in ICU for three weeks  . HERNIA REPAIR    . OVARIAN CYST REMOVAL Left 09/29/2015   Procedure: OVARIAN CYSTECTOMY;  Surgeon: Benjaman Kindler, MD;  Location: [facility] ORS;  Service: Gynecology;  Laterality: Left;  [name] VAGINAL HYSTERECTOMY N/A 09/29/2015   Procedure: HYSTERECTOMY VAGINAL;  Surgeon: Benjaman Kindler, MD;  Location: [facility] ORS;  Service: Gynecology;  Laterality: N/A;    FAMILY HISTORY Family History  Problem Relation Age of Onset  . Arthritis/Rheumatoid Mother   . Heart block Mother   . Clotting disorder Mother   . Osteoporosis Mother   . Heart attack Mother   . Anxiety disorder Mother   . Depression Mother   . Heart disease Father   . Heart attack Father   . Depression Sister   . Anxiety disorder Sister        ADVANCED DIRECTIVES:    HEALTH MAINTENANCE: Social History  Substance Use Topics  . Smoking status: Never Smoker  . Smokeless tobacco: Never Used  . Alcohol use No     Colonoscopy:  PAP:  Bone density:  Lipid panel:  Allergies  Allergen Reactions  . Amoxicillin Anaphylaxis, Hives, Shortness Of Breath, Swelling and Rash    Has patient had a PCN reaction causing immediate rash, facial/tongue/throat swelling, SOB or lightheadedness with hypotension: Yes Has patient had a PCN reaction causing severe rash involving mucus membranes or skin necrosis: Yes Has patient had a PCN reaction that required hospitalization  No Has patient had a PCN reaction occurring within the last 10 years: No If all of the above answers are "NO", then may proceed with Cephalosporin use. Other reaction(s): ANAPHYLAXIS Other reaction(s): ANAPHYLAX  . Penicillins Anaphylaxis, Swelling and Rash    Has patient had a PCN reaction causing immediate rash, facial/tongue/throat swelling, SOB or lightheadedness with hypotension: Yes Has patient had a PCN  reaction causing severe rash involving mucus membranes or skin necrosis: Yes Has patient had a PCN reaction that required hospitalization No Has patient had a PCN reaction occurring within the last 10 years: No If all of the above answers are "NO", then may proceed with Cephalosporin use.   [name] Morphine Other (See Comments)    Muscle spasms  . Sulfa Antibiotics     Other reaction(s): VOMITING    Current Outpatient Prescriptions  Medication Sig Dispense Refill  . acetaminophen (TYLENOL) 325 MG tablet Take 650 mg by mouth every 4 (four) hours as needed for moderate pain.     [name] buPROPion (WELLBUTRIN XL) 150 MG 24 hr tablet Take 3 tablets (450 mg total) by mouth daily. 90 tablet 2  . cyanocobalamin (,VITAMIN B-12,) 1000 MCG/ML injection Inject 1,000 mcg into the muscle every 30 (thirty) days.     . diclofenac sodium (VOLTAREN) 1 % GEL APPLY 4 GMS EVERY 6 HOURS AS NEEDED FOR PAIN    . docusate sodium (COLACE) 100 MG capsule Take 1 capsule (100 mg total) by mouth daily as needed for mild constipation. 60 capsule 3  . GLUCAGON EMERGENCY 1 MG injection INJECT 1 MG INTO THE MUSCLE ONCE AS NEEDED FOR LOW BLOOD SUGAR.  11  . Multiple Vitamins-Minerals (HM MULTIVITAMIN ADULT GUMMY) CHEW Chew 2 tablets by mouth daily.    [name] oxyCODONE-acetaminophen (PERCOCET) 5-325 MG tablet Take 1-2 tablets by mouth every 6 (six) hours as needed for severe pain. 20 tablet 0  . pantoprazole (PROTONIX) 40 MG tablet TAKE 1 TABLET (40 MG TOTAL) BY MOUTH EVERY 12 (TWELVE) HOURS.  8  . sertraline (ZOLOFT) 100 MG tablet Take 2 tablets by mouth daily. 60 tablet 2  . sucralfate (CARAFATE) 1 G tablet TAKE 1 TABLET (1 G TOTAL) BY MOUTH 4 (FOUR) TIMES DAILY BEFORE MEALS AND NIGHTLY.  7  . SUMAtriptan (IMITREX) 100 MG tablet Take 100 mg by mouth every 2 (two) hours as needed for migraine or headache.     . thiamine (VITAMIN B-1) 100 MG tablet Take 100 mg by mouth.    . topiramate (TOPAMAX) 25 MG tablet Take 75 mg by mouth 2 (two) times  daily.     . traMADol (ULTRAM) 50 MG tablet TAKE 50-100 MG EVERY 4 HOURS AS NEEDED FOR PAIN NO MORE THAN 8 TABS IN 24 HOURS  0  . zolpidem (AMBIEN) 10 MG tablet Take 1 tablet (10 mg total) by mouth at bedtime as needed for sleep. 30 tablet 2   No current facility-administered medications for this visit.     OBJECTIVE: Vitals:   02/19/16 1436  BP: 122/75  Pulse: 96  Resp: 18  Temp: 98.2 F (36.8 C)     Body mass index is 20.79 kg/m.    ECOG FS:0 - Asymptomatic  General: Well-developed, well-nourished, no acute distress. Eyes: Pink conjunctiva, anicteric sclera. Lungs: Clear to auscultation bilaterally. Heart: Regular rate and rhythm. No rubs, murmurs, or gallops. Abdomen: Soft, nontender, nondistended. No organomegaly noted, normoactive bowel sounds. Musculoskeletal: No edema, cyanosis, or clubbing. Neuro: Alert, answering all questions appropriately. Cranial nerves grossly  intact. Skin: No rashes or petechiae noted. Psych: Normal affect.   LAB RESULTS:  Lab Results  Component Value Date   NA 141 10/01/2015   K 3.2 (L) 10/01/2015   CL 113 (H) 10/01/2015   CO2 22 10/01/2015   GLUCOSE 97 10/01/2015   BUN 6 10/01/2015   CREATININE 0.56 10/01/2015   CALCIUM 8.1 (L) 10/01/2015   PROT 7.5 08/07/2015   ALBUMIN 4.0 08/07/2015   AST 32 08/07/2015   ALT 28 08/07/2015   ALKPHOS 54 08/07/2015   BILITOT 0.5 08/07/2015   GFRNONAA >60 10/01/2015   GFRAA >60 10/01/2015    Lab Results  Component Value Date   WBC 5.4 02/18/2016   NEUTROABS 3.5 02/18/2016   HGB 12.1 02/18/2016   HCT 37.6 02/18/2016   MCV 85.9 02/18/2016   PLT 342 02/18/2016   Lab Results  Component Value Date   FERRITIN 6 (L) 02/18/2016   Lab Results  Component Value Date   IRON 42 02/18/2016   TIBC 445 02/18/2016   IRONPCTSAT 9 (L) 02/18/2016      STUDIES: Dg Chest 2 View  Result Date: 01/30/2016 CLINICAL DATA:  Cough, shortness of breath. EXAM: CHEST  2 VIEW COMPARISON:  None. FINDINGS: The  heart size and mediastinal contours are within normal limits. Both lungs are clear. No pneumothorax or pleural effusion is noted. The visualized skeletal structures are unremarkable. IMPRESSION: No active cardiopulmonary disease. Electronically Signed   By: Marijo Conception, M.D.   On: 01/30/2016 10:38    ASSESSMENT: Iron deficiency anemia.  PLAN:    1. 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  Patient's hemoglobin is within normal limits, but her iron stores have declined and she is symptomatic. Proceed with one infusion of 510 mg IV Feraheme today. Return to clinic in 4 months with repeat laboratory work and further evaluation. 2. Rheumatoid arthritis: Continued workup to rheumatology. 3. Depression/anxiety:  Continue current medications as prescribed. 4. Dizziness: Unlikely related to iron deficiency, monitor.  Patient expressed understanding and was in agreement with this plan. She also understands that She can call clinic at any time with any questions, concerns, or complaints.     Lloyd Huger, MD   02/20/2016 12:59 PM

## 2016-02-19 ENCOUNTER — Inpatient Hospital Stay: Payer: Medicare Other

## 2016-02-19 ENCOUNTER — Inpatient Hospital Stay: Payer: Medicare Other | Attending: Oncology | Admitting: Oncology

## 2016-02-19 VITALS — BP 122/75 | HR 96 | Temp 98.2°F | Resp 18 | Wt 132.7 lb

## 2016-02-19 DIAGNOSIS — Z9884 Bariatric surgery status: Secondary | ICD-10-CM

## 2016-02-19 DIAGNOSIS — M069 Rheumatoid arthritis, unspecified: Secondary | ICD-10-CM | POA: Diagnosis not present

## 2016-02-19 DIAGNOSIS — D508 Other iron deficiency anemias: Secondary | ICD-10-CM

## 2016-02-19 DIAGNOSIS — F419 Anxiety disorder, unspecified: Secondary | ICD-10-CM | POA: Diagnosis not present

## 2016-02-19 DIAGNOSIS — Z79899 Other long term (current) drug therapy: Secondary | ICD-10-CM

## 2016-02-19 DIAGNOSIS — D509 Iron deficiency anemia, unspecified: Secondary | ICD-10-CM | POA: Diagnosis not present

## 2016-02-19 DIAGNOSIS — R42 Dizziness and giddiness: Secondary | ICD-10-CM

## 2016-02-19 MED ORDER — SODIUM CHLORIDE 0.9 % IV SOLN
Freq: Once | INTRAVENOUS | Status: AC
  Administered 2016-02-19: 15:00:00 via INTRAVENOUS
  Filled 2016-02-19: qty 1000

## 2016-02-19 MED ORDER — SODIUM CHLORIDE 0.9 % IV SOLN
510.0000 mg | Freq: Once | INTRAVENOUS | Status: AC
  Administered 2016-02-19: 510 mg via INTRAVENOUS
  Filled 2016-02-19: qty 17

## 2016-02-19 NOTE — Progress Notes (Signed)
Complains of worsening weakness and dizziness.

## 2016-03-25 ENCOUNTER — Ambulatory Visit: Payer: Medicare Other | Admitting: Psychiatry

## 2016-04-08 ENCOUNTER — Encounter: Payer: Self-pay | Admitting: Psychiatry

## 2016-04-08 ENCOUNTER — Ambulatory Visit (INDEPENDENT_AMBULATORY_CARE_PROVIDER_SITE_OTHER): Payer: Medicare Other | Admitting: Psychiatry

## 2016-04-08 VITALS — BP 116/79 | HR 86 | Temp 98.4°F | Wt 135.4 lb

## 2016-04-08 DIAGNOSIS — F331 Major depressive disorder, recurrent, moderate: Secondary | ICD-10-CM

## 2016-04-08 MED ORDER — SERTRALINE HCL 100 MG PO TABS: ORAL_TABLET | ORAL | 2 refills | Status: DC

## 2016-04-08 MED ORDER — ZOLPIDEM TARTRATE 10 MG PO TABS: 10.0000 mg | ORAL_TABLET | Freq: Every evening | ORAL | 2 refills | Status: DC | PRN

## 2016-04-08 MED ORDER — BUPROPION HCL ER (XL) 150 MG PO TB24: 450.0000 mg | ORAL_TABLET | Freq: Every day | ORAL | 2 refills | Status: DC

## 2016-04-08 NOTE — Progress Notes (Signed)
Patient ID: [name], female   DOB: [birth year], 41 y.o.   MRN: [id number]  [facility] MD/PA/NP OP Progress Note  04/08/2016 11:04 AM [name]  MRN:  [id number]  Subjective:  Patient returns for follow-up for major depressive disorder and panic disorder. Patient brings a 62-year-old daughter in with her. States that spending time with her daughter is the best thing that has happened to her and keeps her going. Patient reports feeling down over the winter. States that she thinks about death a lot and all the people around her are dying. States that she is been feeling quite depressed. Denies any suicidal thoughts. She has been compliant with her medications. Denies any side effects. She reports that she's been having some trouble eating. She also had some complications from her surgery and had to take care of that as well. Patient becomes tearful when talking about some deaths in the family. She agrees that it would benefit her to see Inetta Fermo who has been her therapist a few times. We discussed that we can start her on another medication for the depression if therapy does not help at this time.  PHQ-9 of 16  Chief Complaint: Depressed Chief Complaint    Follow-up; Medication Refill     Visit Diagnosis:     ICD-9-CM ICD-10-CM   1. Major depressive disorder, recurrent episode, moderate (HCC) 296.32 F33.1     Past Medical History:  Past Medical History:  Diagnosis Date  . Anxiety   . Complication of anesthesia    difficulty to get sedated during endoscopy  . Depression   . Headache   . Neuropathy ([facility])   . Pneumonia    within past five years  . PTSD (post-traumatic stress disorder)   . Seizures ([facility])     Past Surgical History:  Procedure Laterality Date  . ABDOMINAL HYSTERECTOMY    . BILATERAL SALPINGECTOMY Bilateral 09/29/2015   Procedure: BILATERAL SALPINGECTOMY;  Surgeon: [name], MD;  Location: [facility] ORS;  Service: Gynecology;  Laterality: Bilateral;  . bowel  obstruction  2011  . CHOLECYSTECTOMY    . GASTRIC BYPASS    . gi bleed  2009   Surgery-was in ICU for three weeks  . HERNIA REPAIR    . OVARIAN CYST REMOVAL Left 09/29/2015   Procedure: OVARIAN CYSTECTOMY;  Surgeon: [name], MD;  Location: [facility] ORS;  Service: Gynecology;  Laterality: Left;  [name] VAGINAL HYSTERECTOMY N/A 09/29/2015   Procedure: HYSTERECTOMY VAGINAL;  Surgeon: [name], MD;  Location: [facility] ORS;  Service: Gynecology;  Laterality: N/A;   Family History:  Family History  Problem Relation Age of Onset  . Arthritis/Rheumatoid Mother   . Heart block Mother   . Clotting disorder Mother   . Osteoporosis Mother   . Heart attack Mother   . Anxiety disorder Mother   . Depression Mother   . Heart disease Father   . Heart attack Father   . Depression Sister   . Anxiety disorder Sister    Social History:  Social History   Social History  . Marital status: Single    Spouse name: N/A  . Number of children: N/A  . Years of education: N/A   Social History Main Topics  . Smoking status: Never Smoker  . Smokeless tobacco: Never Used  . Alcohol use No  . Drug use: No  . Sexual activity: Yes    Birth control/ protection: Surgical   Other Topics Concern  . None   Social History Narrative  .  None   Additional History:   Assessment:   Musculoskeletal: Strength & Muscle Tone: within normal limits Gait & Station: normal Patient leans: N/A  Psychiatric Specialty Exam: HPI  Review of Systems  Psychiatric/Behavioral: Negative for depression, hallucinations, memory loss, substance abuse and suicidal ideas. The patient does not have insomnia. Nervous/anxious: excessive thoughts about harm coming to relatives, comments or parents are getting older.   All other systems reviewed and are negative.   Blood pressure 116/79, pulse 86, temperature 98.4 F (36.9 C), temperature source Oral, weight 135 lb 6.4 oz (61.4 kg).Body mass index is 21.21 kg/m.  General  Appearance: Well Groomed  Eye Contact:  Good  Speech:  Normal Rate  Volume:  Normal  Mood:  Depressed   Affect:  pleasant  Thought Process:  Linear and Logical  Orientation:  Full (Time, Place, and Person)  Thought Content:  Negative  Suicidal Thoughts:  No  Homicidal Thoughts:  No  Memory:  Immediate;   Good Recent;   Good Remote;   Good  Judgement:  Good  Insight:  Good  Psychomotor Activity:  Negative  Concentration:  Good  Recall:  Good  Fund of Knowledge: Good  Language: Good  Akathisia:  Negative  Handed:  Right  AIMS (if indicated):  N/A  Assets:  Communication Skills Desire for Improvement Social Support  ADL's:  Intact  Cognition: WNL  Sleep:  3 hours with Ambien   Is the patient at risk to self?  No. Has the patient been a risk to self in the past 6 months?  No. Has the patient been a risk to self within the distant past?  No. Is the patient a risk to others?  No. Has the patient been a risk to others in the past 6 months?  No. Has the patient been a risk to others within the distant past?  No.  Current Medications: Current Outpatient Prescriptions  Medication Sig Dispense Refill  . acetaminophen (TYLENOL) 325 MG tablet Take 650 mg by mouth every 4 (four) hours as needed for moderate pain.     [name] buPROPion (WELLBUTRIN XL) 150 MG 24 hr tablet Take 3 tablets (450 mg total) by mouth daily. 90 tablet 2  . cyanocobalamin (,VITAMIN B-12,) 1000 MCG/ML injection Inject 1,000 mcg into the muscle every 30 (thirty) days.     . diclofenac sodium (VOLTAREN) 1 % GEL APPLY 4 GMS EVERY 6 HOURS AS NEEDED FOR PAIN    . docusate sodium (COLACE) 100 MG capsule Take 1 capsule (100 mg total) by mouth daily as needed for mild constipation. 60 capsule 3  . GLUCAGON EMERGENCY 1 MG injection INJECT 1 MG INTO THE MUSCLE ONCE AS NEEDED FOR LOW BLOOD SUGAR.  11  . Multiple Vitamins-Minerals (HM MULTIVITAMIN ADULT GUMMY) CHEW Chew 2 tablets by mouth daily.    [name] oxyCODONE-acetaminophen  (PERCOCET) 5-325 MG tablet Take 1-2 tablets by mouth every 6 (six) hours as needed for severe pain. 20 tablet 0  . pantoprazole (PROTONIX) 40 MG tablet TAKE 1 TABLET (40 MG TOTAL) BY MOUTH EVERY 12 (TWELVE) HOURS.  8  . sertraline (ZOLOFT) 100 MG tablet Take 2 tablets by mouth daily. 60 tablet 2  . sucralfate (CARAFATE) 1 G tablet TAKE 1 TABLET (1 G TOTAL) BY MOUTH 4 (FOUR) TIMES DAILY BEFORE MEALS AND NIGHTLY.  7  . SUMAtriptan (IMITREX) 100 MG tablet Take 100 mg by mouth every 2 (two) hours as needed for migraine or headache.     . thiamine (VITAMIN  B-1) 100 MG tablet Take 100 mg by mouth.    . traMADol (ULTRAM) 50 MG tablet TAKE 50-100 MG EVERY 4 HOURS AS NEEDED FOR PAIN NO MORE THAN 8 TABS IN 24 HOURS  0  . zolpidem (AMBIEN) 10 MG tablet Take 1 tablet (10 mg total) by mouth at bedtime as needed for sleep. 30 tablet 2  . topiramate (TOPAMAX) 25 MG tablet Take 75 mg by mouth 2 (two) times daily.      No current facility-administered medications for this visit.     Medical Decision Making:  Established Problem, Stable/Improving (1), Review of Medication Regimen & Side Effects (2) and Review of New Medication or Change in Dosage (2)  Treatment Plan Summary:Medication management and Plan   Major depressive disorder, recurrent, moderate- Continue Wellbutrin XL 450 mg.  Continue sertraline at 200 mg daily Discussed the interactions between sertraline and tramadol which could lead to serotonin syndrome and risk of seizures. However patient states that she is been doing quite well and she is also on a seizure medication for her  history of seizures. Start seeing Inetta Fermo for therapy and address her issues.  She is urged to use high strength lamps in her house to have adequate like him to help with any mood changes in the winter season   Insomnia- continue Ambien 10 mg at bedtime as needed.  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 She's been encouraged  call any questions or concerns prior to her next appointment.   Mckale Haffey 04/08/2016, 11:04 AM

## 2016-05-27 ENCOUNTER — Ambulatory Visit: Payer: Medicare Other | Admitting: Psychiatry

## 2016-06-10 ENCOUNTER — Ambulatory Visit: Payer: Medicare Other | Admitting: Psychiatry

## 2016-06-10 ENCOUNTER — Encounter: Payer: Self-pay | Admitting: Psychiatry

## 2016-06-10 ENCOUNTER — Ambulatory Visit (INDEPENDENT_AMBULATORY_CARE_PROVIDER_SITE_OTHER): Payer: Medicare Other | Admitting: Psychiatry

## 2016-06-10 VITALS — BP 104/68 | HR 85 | Temp 98.3°F | Wt 147.0 lb

## 2016-06-10 DIAGNOSIS — F331 Major depressive disorder, recurrent, moderate: Secondary | ICD-10-CM | POA: Diagnosis not present

## 2016-06-10 MED ORDER — SERTRALINE HCL 100 MG PO TABS: ORAL_TABLET | ORAL | 2 refills | Status: DC

## 2016-06-10 MED ORDER — TRAZODONE HCL 50 MG PO TABS: 50.0000 mg | ORAL_TABLET | Freq: Every day | ORAL | 1 refills | Status: DC

## 2016-06-10 MED ORDER — BUPROPION HCL ER (XL) 150 MG PO TB24: 450.0000 mg | ORAL_TABLET | Freq: Every day | ORAL | 2 refills | Status: DC

## 2016-06-10 MED ORDER — ZOLPIDEM TARTRATE 10 MG PO TABS: 10.0000 mg | ORAL_TABLET | Freq: Every evening | ORAL | 2 refills | Status: DC | PRN

## 2016-06-10 NOTE — Progress Notes (Signed)
Patient ID: MAKAYLIA KOONE, female   DOB: Aug 13, 1975, 41 y.o.   MRN: [id number]  [facility] MD/PA/NP OP Progress Note  06/10/2016 2:35 PM AILAH HAGELSTEIN  MRN:  [id number]  Subjective:  Patient returns for follow-up for major depressive disorder and panic disorder. Patient brings a 28-year-old daughter in with her. Reports doing better than her previous visit. She reports that she has not started seeing Inetta Fermo for therapy. States that she needs to get in touch with her. Continues continues to endorse some social issues with her mother-in-law. States that she is also stressed about their finances. She continues to worry about death in general. She did report that speaking to Inetta Fermo has been quite helpful in the past. She denies any suicidal thoughts. Reports that spending time with HER-13-year-old is her joy.  Chief Complaint: Depressed Chief Complaint    Medication Refill; Follow-up     Visit Diagnosis:     ICD-9-CM ICD-10-CM   1. Major depressive disorder, recurrent episode, moderate (HCC) 296.32 F33.1     Past Medical History:  Past Medical History:  Diagnosis Date  . Anxiety   . Complication of anesthesia    difficulty to get sedated during endoscopy  . Depression   . Headache   . Neuropathy   . Pneumonia    within past five years  . PTSD (post-traumatic stress disorder)   . Seizures (HCC)     Past Surgical History:  Procedure Laterality Date  . ABDOMINAL HYSTERECTOMY    . BILATERAL SALPINGECTOMY Bilateral 09/29/2015   Procedure: BILATERAL SALPINGECTOMY;  Surgeon: [name], MD;  Location: [facility] ORS;  Service: Gynecology;  Laterality: Bilateral;  . bowel obstruction  2011  . CHOLECYSTECTOMY    . GASTRIC BYPASS    . gi bleed  2009   Surgery-was in ICU for three weeks  . HERNIA REPAIR    . OVARIAN CYST REMOVAL Left 09/29/2015   Procedure: OVARIAN CYSTECTOMY;  Surgeon: [name], MD;  Location: [facility] ORS;  Service: Gynecology;  Laterality: Left;  [name] VAGINAL HYSTERECTOMY  N/A 09/29/2015   Procedure: HYSTERECTOMY VAGINAL;  Surgeon: [name], MD;  Location: [facility] ORS;  Service: Gynecology;  Laterality: N/A;   Family History:  Family History  Problem Relation Age of Onset  . Arthritis/Rheumatoid Mother   . Heart block Mother   . Clotting disorder Mother   . Osteoporosis Mother   . Heart attack Mother   . Anxiety disorder Mother   . Depression Mother   . Heart disease Father   . Heart attack Father   . Depression Sister   . Anxiety disorder Sister    Social History:  Social History   Social History  . Marital status: Single    Spouse name: N/A  . Number of children: N/A  . Years of education: N/A   Social History Main Topics  . Smoking status: Never Smoker  . Smokeless tobacco: Never Used  . Alcohol use No  . Drug use: No  . Sexual activity: Yes    Birth control/ protection: Surgical   Other Topics Concern  . None   Social History Narrative  . None   Additional History:   Assessment:   Musculoskeletal: Strength & Muscle Tone: within normal limits Gait & Station: normal Patient leans: N/A  Psychiatric Specialty Exam: Medication Refill     Review of Systems  Psychiatric/Behavioral: Negative for depression, hallucinations, memory loss, substance abuse and suicidal ideas. The patient does not have insomnia. Nervous/anxious: excessive thoughts about harm coming  to relatives, comments or parents are getting older.   All other systems reviewed and are negative.   Blood pressure 104/68, pulse 85, temperature 98.3 F (36.8 C), temperature source Oral, weight 147 lb (66.7 kg).Body mass index is 23.02 kg/m.  General Appearance: Well Groomed  Eye Contact:  Good  Speech:  Normal Rate  Volume:  Normal  Mood:  Depressed   Affect:  pleasant  Thought Process:  Linear and Logical  Orientation:  Full (Time, Place, and Person)  Thought Content:  Negative  Suicidal Thoughts:  No  Homicidal Thoughts:  No  Memory:  Immediate;    Good Recent;   Good Remote;   Good  Judgement:  Good  Insight:  Good  Psychomotor Activity:  Negative  Concentration:  Good  Recall:  Good  Fund of Knowledge: Good  Language: Good  Akathisia:  Negative  Handed:  Right  AIMS (if indicated):  N/A  Assets:  Communication Skills Desire for Improvement Social Support  ADL's:  Intact  Cognition: WNL  Sleep:  3 hours with Ambien   Is the patient at risk to self?  No. Has the patient been a risk to self in the past 6 months?  No. Has the patient been a risk to self within the distant past?  No. Is the patient a risk to others?  No. Has the patient been a risk to others in the past 6 months?  No. Has the patient been a risk to others within the distant past?  No.  Current Medications: Current Outpatient Prescriptions  Medication Sig Dispense Refill  . acetaminophen (TYLENOL) 325 MG tablet Take 650 mg by mouth every 4 (four) hours as needed for moderate pain.     [name] buPROPion (WELLBUTRIN XL) 150 MG 24 hr tablet Take 3 tablets (450 mg total) by mouth daily. 90 tablet 2  . cyanocobalamin (,VITAMIN B-12,) 1000 MCG/ML injection Inject 1,000 mcg into the muscle every 30 (thirty) days.     . diclofenac sodium (VOLTAREN) 1 % GEL APPLY 4 GMS EVERY 6 HOURS AS NEEDED FOR PAIN    . docusate sodium (COLACE) 100 MG capsule Take 1 capsule (100 mg total) by mouth daily as needed for mild constipation. 60 capsule 3  . GLUCAGON EMERGENCY 1 MG injection INJECT 1 MG INTO THE MUSCLE ONCE AS NEEDED FOR LOW BLOOD SUGAR.  11  . Multiple Vitamins-Minerals (HM MULTIVITAMIN ADULT GUMMY) CHEW Chew 2 tablets by mouth daily.    [name] oxyCODONE-acetaminophen (PERCOCET) 5-325 MG tablet Take 1-2 tablets by mouth every 6 (six) hours as needed for severe pain. 20 tablet 0  . pantoprazole (PROTONIX) 40 MG tablet TAKE 1 TABLET (40 MG TOTAL) BY MOUTH EVERY 12 (TWELVE) HOURS.  8  . sertraline (ZOLOFT) 100 MG tablet Take 2 tablets by mouth daily. 60 tablet 2  . sucralfate  (CARAFATE) 1 G tablet TAKE 1 TABLET (1 G TOTAL) BY MOUTH 4 (FOUR) TIMES DAILY BEFORE MEALS AND NIGHTLY.  7  . SUMAtriptan (IMITREX) 100 MG tablet Take 100 mg by mouth every 2 (two) hours as needed for migraine or headache.     . thiamine (VITAMIN B-1) 100 MG tablet Take 100 mg by mouth.    . traMADol (ULTRAM) 50 MG tablet TAKE 50-100 MG EVERY 4 HOURS AS NEEDED FOR PAIN NO MORE THAN 8 TABS IN 24 HOURS  0  . zolpidem (AMBIEN) 10 MG tablet Take 1 tablet (10 mg total) by mouth at bedtime as needed for sleep. 30 tablet  2  . topiramate (TOPAMAX) 25 MG tablet Take 75 mg by mouth 2 (two) times daily.      No current facility-administered medications for this visit.     Medical Decision Making:  Established Problem, Stable/Improving (1), Review of Medication Regimen & Side Effects (2) and Review of New Medication or Change in Dosage (2)  Treatment Plan Summary:Medication management and Plan   Major depressive disorder, recurrent, moderate- Continue Wellbutrin XL 450 mg.  Continue sertraline at 200 mg daily Discussed the interactions between sertraline and tramadol which could lead to serotonin syndrome and risk of seizures. However patient states that she is been doing quite well and she is also on a seizure medication for her  history of seizures. Start seeing Inetta Fermo for therapy and address her issues, patient states she will call her and understands she needs to see someone.  She is urged to use high strength lamps in her house to have adequate like him to help with any mood changes in the winter season   Insomnia- continue Ambien 10 mg at bedtime as needed. Start trazodone at 50mg  po qhs.  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 She's been encouraged call any questions or concerns prior to her next appointment.   Malyiah Fellows 06/10/2016, 2:35 PM

## 2016-06-20 NOTE — Progress Notes (Deleted)
[place]  [phone]) [phone] Fax:(336) [phone]  ID: [name] OB: [birth year]  MR#: [id number]  HRC#:[id number]  Patient Care Team: Leonard Downing, MD as PCP - General (Family Medicine)  CHIEF COMPLAINT: Iron deficiency anemia.   INTERVAL HISTORY: Patient last evaluated over 8 months ago.  She returns to clinic today for repeat laboratory work, further evaluation, and consideration of additional Feraheme. Her weakness and fatigue is unchanged. She has multiple other medical complaints that are also unchanged.  She has occasional dizziness, but no neurologic complaints.  She has had no recent fevers or illnesses.  She denies any chest pain or shortness of breath.  She has a good appetite and denies any nausea, vomiting, constipation, or diarrhea.  She has no melena or hematochezia.  She has no urinary complaints.  Patient offers no further specific complaints today.    REVIEW OF SYSTEMS:   Review of Systems  Constitutional: Positive for malaise/fatigue. Negative for fever.  Respiratory: Negative.   Cardiovascular: Negative.   Gastrointestinal: Negative.   Musculoskeletal: Positive for back pain, joint pain and neck pain.  Neurological: Positive for dizziness and weakness.  Psychiatric/Behavioral: Positive for depression.    As per HPI. Otherwise, a complete review of systems is negative.  PAST MEDICAL HISTORY: Past Medical History:  Diagnosis Date  . Anxiety   . Complication of anesthesia    difficulty to get sedated during endoscopy  . Depression   . Headache   . Neuropathy   . Pneumonia    within past five years  . PTSD (post-traumatic stress disorder)   . Seizures ([place])     PAST SURGICAL HISTORY: Past Surgical History:  Procedure Laterality Date  . ABDOMINAL HYSTERECTOMY    . BILATERAL SALPINGECTOMY Bilateral 09/29/2015   Procedure: BILATERAL SALPINGECTOMY;  Surgeon: Benjaman Kindler, MD;  Location: [facility] ORS;  Service:  Gynecology;  Laterality: Bilateral;  . bowel obstruction  2011  . CHOLECYSTECTOMY    . GASTRIC BYPASS    . gi bleed  2009   Surgery-was in ICU for three weeks  . HERNIA REPAIR    . OVARIAN CYST REMOVAL Left 09/29/2015   Procedure: OVARIAN CYSTECTOMY;  Surgeon: Benjaman Kindler, MD;  Location: [facility] ORS;  Service: Gynecology;  Laterality: Left;  [name] VAGINAL HYSTERECTOMY N/A 09/29/2015   Procedure: HYSTERECTOMY VAGINAL;  Surgeon: Benjaman Kindler, MD;  Location: [facility] ORS;  Service: Gynecology;  Laterality: N/A;    FAMILY HISTORY Family History  Problem Relation Age of Onset  . Arthritis/Rheumatoid Mother   . Heart block Mother   . Clotting disorder Mother   . Osteoporosis Mother   . Heart attack Mother   . Anxiety disorder Mother   . Depression Mother   . Heart disease Father   . Heart attack Father   . Depression Sister   . Anxiety disorder Sister        ADVANCED DIRECTIVES:    HEALTH MAINTENANCE: Social History  Substance Use Topics  . Smoking status: Never Smoker  . Smokeless tobacco: Never Used  . Alcohol use No     Colonoscopy:  PAP:  Bone density:  Lipid panel:  Allergies  Allergen Reactions  . Amoxicillin Anaphylaxis, Hives, Shortness Of Breath, Swelling and Rash    Has patient had a PCN reaction causing immediate rash, facial/tongue/throat swelling, SOB or lightheadedness with hypotension: Yes Has patient had a PCN reaction causing severe rash involving mucus membranes or skin necrosis: Yes Has patient had a PCN reaction that required hospitalization No  Has patient had a PCN reaction occurring within the last 10 years: No If all of the above answers are "NO", then may proceed with Cephalosporin use. Other reaction(s): ANAPHYLAXIS Other reaction(s): ANAPHYLAX  . Penicillins Anaphylaxis, Swelling and Rash    Has patient had a PCN reaction causing immediate rash, facial/tongue/throat swelling, SOB or lightheadedness with hypotension: Yes Has patient had a PCN  reaction causing severe rash involving mucus membranes or skin necrosis: Yes Has patient had a PCN reaction that required hospitalization No Has patient had a PCN reaction occurring within the last 10 years: No If all of the above answers are "NO", then may proceed with Cephalosporin use.   [name] Morphine Other (See Comments)    Muscle spasms  . Sulfa Antibiotics     Other reaction(s): VOMITING    Current Outpatient Prescriptions  Medication Sig Dispense Refill  . acetaminophen (TYLENOL) 325 MG tablet Take 650 mg by mouth every 4 (four) hours as needed for moderate pain.     [name] buPROPion (WELLBUTRIN XL) 150 MG 24 hr tablet Take 3 tablets (450 mg total) by mouth daily. 90 tablet 2  . cyanocobalamin (,VITAMIN B-12,) 1000 MCG/ML injection Inject 1,000 mcg into the muscle every 30 (thirty) days.     . diclofenac sodium (VOLTAREN) 1 % GEL APPLY 4 GMS EVERY 6 HOURS AS NEEDED FOR PAIN    . docusate sodium (COLACE) 100 MG capsule Take 1 capsule (100 mg total) by mouth daily as needed for mild constipation. 60 capsule 3  . GLUCAGON EMERGENCY 1 MG injection INJECT 1 MG INTO THE MUSCLE ONCE AS NEEDED FOR LOW BLOOD SUGAR.  11  . Multiple Vitamins-Minerals (HM MULTIVITAMIN ADULT GUMMY) CHEW Chew 2 tablets by mouth daily.    [name] oxyCODONE-acetaminophen (PERCOCET) 5-325 MG tablet Take 1-2 tablets by mouth every 6 (six) hours as needed for severe pain. 20 tablet 0  . pantoprazole (PROTONIX) 40 MG tablet TAKE 1 TABLET (40 MG TOTAL) BY MOUTH EVERY 12 (TWELVE) HOURS.  8  . sertraline (ZOLOFT) 100 MG tablet Take 2 tablets by mouth daily. 60 tablet 2  . sucralfate (CARAFATE) 1 G tablet TAKE 1 TABLET (1 G TOTAL) BY MOUTH 4 (FOUR) TIMES DAILY BEFORE MEALS AND NIGHTLY.  7  . SUMAtriptan (IMITREX) 100 MG tablet Take 100 mg by mouth every 2 (two) hours as needed for migraine or headache.     . thiamine (VITAMIN B-1) 100 MG tablet Take 100 mg by mouth.    . topiramate (TOPAMAX) 25 MG tablet Take 75 mg by mouth 2 (two) times  daily.     . traMADol (ULTRAM) 50 MG tablet TAKE 50-100 MG EVERY 4 HOURS AS NEEDED FOR PAIN NO MORE THAN 8 TABS IN 24 HOURS  0  . traZODone (DESYREL) 50 MG tablet Take 1 tablet (50 mg total) by mouth at bedtime. 30 tablet 1  . zolpidem (AMBIEN) 10 MG tablet Take 1 tablet (10 mg total) by mouth at bedtime as needed for sleep. 30 tablet 2   No current facility-administered medications for this visit.     OBJECTIVE: There were no vitals filed for this visit.   There is no height or weight on file to calculate BMI.    ECOG FS:0 - Asymptomatic  General: Well-developed, well-nourished, no acute distress. Eyes: Pink conjunctiva, anicteric sclera. Lungs: Clear to auscultation bilaterally. Heart: Regular rate and rhythm. No rubs, murmurs, or gallops. Abdomen: Soft, nontender, nondistended. No organomegaly noted, normoactive bowel sounds. Musculoskeletal: No edema, cyanosis, or  clubbing. Neuro: Alert, answering all questions appropriately. Cranial nerves grossly intact. Skin: No rashes or petechiae noted. Psych: Normal affect.   LAB RESULTS:  Lab Results  Component Value Date   NA 141 10/01/2015   K 3.2 (L) 10/01/2015   CL 113 (H) 10/01/2015   CO2 22 10/01/2015   GLUCOSE 97 10/01/2015   BUN 6 10/01/2015   CREATININE 0.56 10/01/2015   CALCIUM 8.1 (L) 10/01/2015   PROT 7.5 08/07/2015   ALBUMIN 4.0 08/07/2015   AST 32 08/07/2015   ALT 28 08/07/2015   ALKPHOS 54 08/07/2015   BILITOT 0.5 08/07/2015   GFRNONAA >60 10/01/2015   GFRAA >60 10/01/2015    Lab Results  Component Value Date   WBC 5.4 02/18/2016   NEUTROABS 3.5 02/18/2016   HGB 12.1 02/18/2016   HCT 37.6 02/18/2016   MCV 85.9 02/18/2016   PLT 342 02/18/2016   Lab Results  Component Value Date   FERRITIN 6 (L) 02/18/2016   Lab Results  Component Value Date   IRON 42 02/18/2016   TIBC 445 02/18/2016   IRONPCTSAT 9 (L) 02/18/2016      STUDIES: No results found.  ASSESSMENT: Iron deficiency anemia.  PLAN:      1. 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  Patient's hemoglobin is within normal limits, but her iron stores have declined and she is symptomatic. Proceed with one infusion of 510 mg IV Feraheme today. Return to clinic in 4 months with repeat laboratory work and further evaluation. 2. Rheumatoid arthritis: Continued workup to rheumatology. 3. Depression/anxiety:  Continue current medications as prescribed. 4. Dizziness: Unlikely related to iron deficiency, monitor.  Patient expressed understanding and was in agreement with this plan. She also understands that She can call clinic at any time with any questions, concerns, or complaints.     Lloyd Huger, MD   06/20/2016 6:06 PM

## 2016-06-21 ENCOUNTER — Inpatient Hospital Stay: Payer: Medicare Other | Admitting: Oncology

## 2016-06-21 ENCOUNTER — Inpatient Hospital Stay: Payer: Medicare Other

## 2016-06-23 ENCOUNTER — Inpatient Hospital Stay: Payer: Medicare Other | Admitting: Oncology

## 2016-06-23 ENCOUNTER — Inpatient Hospital Stay: Payer: Medicare Other

## 2016-06-23 NOTE — Progress Notes (Deleted)
[place]  [phone] [phone]  ID: [name] OB: Oct 26, 1975  MR#: [id number]  GYI#:[id number]  Patient Care Team: Leonard Downing, MD as PCP - General (Family Medicine)  CHIEF COMPLAINT: Iron deficiency anemia.   INTERVAL HISTORY: Patient last evaluated over 8 months ago.  She returns to clinic today for repeat laboratory work, further evaluation, and consideration of additional Feraheme. Her weakness and fatigue is unchanged. She has multiple other medical complaints that are also unchanged.  She has occasional dizziness, but no neurologic complaints.  She has had no recent fevers or illnesses.  She denies any chest pain or shortness of breath.  She has a good appetite and denies any nausea, vomiting, constipation, or diarrhea.  She has no melena or hematochezia.  She has no urinary complaints.  Patient offers no further specific complaints today.    REVIEW OF SYSTEMS:   Review of Systems  Constitutional: Positive for malaise/fatigue. Negative for fever.  Respiratory: Negative.   Cardiovascular: Negative.   Gastrointestinal: Negative.   Musculoskeletal: Positive for back pain, joint pain and neck pain.  Neurological: Positive for dizziness and weakness.  Psychiatric/Behavioral: Positive for depression.    As per HPI. Otherwise, a complete review of systems is negative.  PAST MEDICAL HISTORY: Past Medical History:  Diagnosis Date  . Anxiety   . Complication of anesthesia    difficulty to get sedated during endoscopy  . Depression   . Headache   . Neuropathy   . Pneumonia    within past five years  . PTSD (post-traumatic stress disorder)   . Seizures ([place])     PAST SURGICAL HISTORY: Past Surgical History:  Procedure Laterality Date  . ABDOMINAL HYSTERECTOMY    . BILATERAL SALPINGECTOMY Bilateral 09/29/2015   Procedure: BILATERAL SALPINGECTOMY;  Surgeon: Benjaman Kindler, MD;  Location: [facility] OR[facility];  Service:  Gynecology;  Laterality: Bilateral;  . bowel obstruction  2011  . CHOLECYSTECTOMY    . GASTRIC BYPASS    . gi bleed  2009   Surgery-was in ICU for three weeks  . HERNIA REPAIR    . OVARIAN CYST REMOVAL Left 09/29/2015   Procedure: OVARIAN CYSTECTOMY;  Surgeon: Benjaman Kindler, MD;  Location: [facility] OR[facility];  Service: Gynecology;  Laterality: Left;  [name] VAGINAL HYSTERECTOMY N/A 09/29/2015   Procedure: HYSTERECTOMY VAGINAL;  Surgeon: Benjaman Kindler, MD;  Location: [facility];  Service: Gynecology;  Laterality: N/A;    FAMILY HISTORY Family History  Problem Relation Age of Onset  . Arthritis/Rheumatoid Mother   . Heart block Mother   . Clotting disorder Mother   . Osteoporosis Mother   . Heart attack Mother   . Anxiety disorder Mother   . Depression Mother   . Heart disease Father   . Heart attack Father   . Depression Sister   . Anxiety disorder Sister        ADVANCED DIRECTIVES:    HEALTH MAINTENANCE: Social History  Substance Use Topics  . Smoking status: Never Smoker  . Smokeless tobacco: Never Used  . Alcohol use No     Colonoscopy:  PAP:  Bone density:  Lipid panel:  Allergies  Allergen Reactions  . Amoxicillin Anaphylaxis, Hives, Shortness Of Breath, Swelling and Rash    Has patient had a PCN reaction causing immediate rash, facial/tongue/throat swelling, SOB or lightheadedness with hypotension: Yes Has patient had a PCN reaction causing severe rash involving mucus membranes or skin necrosis: Yes Has patient had a PCN reaction that required hospitalization No  Has patient had a PCN reaction occurring within the last 10 years: No If all of the above answers are "NO", then may proceed with Cephalosporin use. Other reaction(s): ANAPHYLAXIS Other reaction(s): ANAPHYLAX  . Penicillins Anaphylaxis, Swelling and Rash    Has patient had a PCN reaction causing immediate rash, facial/tongue/throat swelling, SOB or lightheadedness with hypotension: Yes Has patient had a PCN  reaction causing severe rash involving mucus membranes or skin necrosis: Yes Has patient had a PCN reaction that required hospitalization No Has patient had a PCN reaction occurring within the last 10 years: No If all of the above answers are "NO", then may proceed with Cephalosporin use.   [name] Morphine Other (See Comments)    Muscle spasms  . Sulfa Antibiotics     Other reaction(s): VOMITING    Current Outpatient Prescriptions  Medication Sig Dispense Refill  . acetaminophen (TYLENOL) 325 MG tablet Take 650 mg by mouth every 4 (four) hours as needed for moderate pain.     [name] buPROPion (WELLBUTRIN XL) 150 MG 24 hr tablet Take 3 tablets (450 mg total) by mouth daily. 90 tablet 2  . cyanocobalamin (,VITAMIN B-12,) 1000 MCG/ML injection Inject 1,000 mcg into the muscle every 30 (thirty) days.     . diclofenac sodium (VOLTAREN) 1 % GEL APPLY 4 GMS EVERY 6 HOURS AS NEEDED FOR PAIN    . docusate sodium (COLACE) 100 MG capsule Take 1 capsule (100 mg total) by mouth daily as needed for mild constipation. 60 capsule 3  . GLUCAGON EMERGENCY 1 MG injection INJECT 1 MG INTO THE MUSCLE ONCE AS NEEDED FOR LOW BLOOD SUGAR.  11  . Multiple Vitamins-Minerals (HM MULTIVITAMIN ADULT GUMMY) CHEW Chew 2 tablets by mouth daily.    [name] oxyCODONE-acetaminophen (PERCOCET) 5-325 MG tablet Take 1-2 tablets by mouth every 6 (six) hours as needed for severe pain. 20 tablet 0  . pantoprazole (PROTONIX) 40 MG tablet TAKE 1 TABLET (40 MG TOTAL) BY MOUTH EVERY 12 (TWELVE) HOURS.  8  . sertraline (ZOLOFT) 100 MG tablet Take 2 tablets by mouth daily. 60 tablet 2  . sucralfate (CARAFATE) 1 G tablet TAKE 1 TABLET (1 G TOTAL) BY MOUTH 4 (FOUR) TIMES DAILY BEFORE MEALS AND NIGHTLY.  7  . SUMAtriptan (IMITREX) 100 MG tablet Take 100 mg by mouth every 2 (two) hours as needed for migraine or headache.     . thiamine (VITAMIN B-1) 100 MG tablet Take 100 mg by mouth.    . topiramate (TOPAMAX) 25 MG tablet Take 75 mg by mouth 2 (two) times  daily.     . traMADol (ULTRAM) 50 MG tablet TAKE 50-100 MG EVERY 4 HOURS AS NEEDED FOR PAIN NO MORE THAN 8 TABS IN 24 HOURS  0  . traZODone (DESYREL) 50 MG tablet Take 1 tablet (50 mg total) by mouth at bedtime. 30 tablet 1  . zolpidem (AMBIEN) 10 MG tablet Take 1 tablet (10 mg total) by mouth at bedtime as needed for sleep. 30 tablet 2   No current facility-administered medications for this visit.     OBJECTIVE: There were no vitals filed for this visit.   There is no height or weight on file to calculate BMI.    ECOG FS:0 - Asymptomatic  General: Well-developed, well-nourished, no acute distress. Eyes: Pink conjunctiva, anicteric sclera. Lungs: Clear to auscultation bilaterally. Heart: Regular rate and rhythm. No rubs, murmurs, or gallops. Abdomen: Soft, nontender, nondistended. No organomegaly noted, normoactive bowel sounds. Musculoskeletal: No edema, cyanosis, or  clubbing. Neuro: Alert, answering all questions appropriately. Cranial nerves grossly intact. Skin: No rashes or petechiae noted. Psych: Normal affect.   LAB RESULTS:  Lab Results  Component Value Date   NA 141 10/01/2015   K 3.2 (L) 10/01/2015   CL 113 (H) 10/01/2015   CO2 22 10/01/2015   GLUCOSE 97 10/01/2015   BUN 6 10/01/2015   CREATININE 0.56 10/01/2015   CALCIUM 8.1 (L) 10/01/2015   PROT 7.5 08/07/2015   ALBUMIN 4.0 08/07/2015   AST 32 08/07/2015   ALT 28 08/07/2015   ALKPHOS 54 08/07/2015   BILITOT 0.5 08/07/2015   GFRNONAA >60 10/01/2015   GFRAA >60 10/01/2015    Lab Results  Component Value Date   WBC 5.4 02/18/2016   NEUTROABS 3.5 02/18/2016   HGB 12.1 02/18/2016   HCT 37.6 02/18/2016   MCV 85.9 02/18/2016   PLT 342 02/18/2016   Lab Results  Component Value Date   FERRITIN 6 (L) 02/18/2016   Lab Results  Component Value Date   IRON 42 02/18/2016   TIBC 445 02/18/2016   IRONPCTSAT 9 (L) 02/18/2016      STUDIES: No results found.  ASSESSMENT: Iron deficiency anemia.  PLAN:      1. 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  Patient's hemoglobin is within normal limits, but her iron stores have declined and she is symptomatic. Proceed with one infusion of 510 mg IV Feraheme today. Return to clinic in 4 months with repeat laboratory work and further evaluation. 2. Rheumatoid arthritis: Continued workup to rheumatology. 3. Depression/anxiety:  Continue current medications as prescribed. 4. Dizziness: Unlikely related to iron deficiency, monitor.  Patient expressed understanding and was in agreement with this plan. She also understands that She can call clinic at any time with any questions, concerns, or complaints.     Lloyd Huger, MD   06/23/2016 8:31 AM

## 2016-06-27 NOTE — Progress Notes (Deleted)
[place]  [phone] [phone]) [phone]  ID: [name] OB: 15-Jul-1975  MR#: [id number]  WYO#:[id number]  Patient Care Team: Leonard Downing, MD as PCP - General (Family Medicine)  CHIEF COMPLAINT: Iron deficiency anemia.   INTERVAL HISTORY: Patient last evaluated over 8 months ago.  She returns to clinic today for repeat laboratory work, further evaluation, and consideration of additional Feraheme. Her weakness and fatigue is unchanged. She has multiple other medical complaints that are also unchanged.  She has occasional dizziness, but no neurologic complaints.  She has had no recent fevers or illnesses.  She denies any chest pain or shortness of breath.  She has a good appetite and denies any nausea, vomiting, constipation, or diarrhea.  She has no melena or hematochezia.  She has no urinary complaints.  Patient offers no further specific complaints today.    REVIEW OF SYSTEMS:   Review of Systems  Constitutional: Positive for malaise/fatigue. Negative for fever.  Respiratory: Negative.   Cardiovascular: Negative.   Gastrointestinal: Negative.   Musculoskeletal: Positive for back pain, joint pain and neck pain.  Neurological: Positive for dizziness and weakness.  Psychiatric/Behavioral: Positive for depression.    As per HPI. Otherwise, a complete review of systems is negative.  PAST MEDICAL HISTORY: Past Medical History:  Diagnosis Date  . Anxiety   . Complication of anesthesia    difficulty to get sedated during endoscopy  . Depression   . Headache   . Neuropathy   . Pneumonia    within past five years  . PTSD (post-traumatic stress disorder)   . Seizures ([place])     PAST SURGICAL HISTORY: Past Surgical History:  Procedure Laterality Date  . ABDOMINAL HYSTERECTOMY    . BILATERAL SALPINGECTOMY Bilateral 09/29/2015   Procedure: BILATERAL SALPINGECTOMY;  Surgeon: Benjaman Kindler, MD;  Location: [facility];  Service:  Gynecology;  Laterality: Bilateral;  . bowel obstruction  2011  . CHOLECYSTECTOMY    . GASTRIC BYPASS    . gi bleed  2009   Surgery-was in ICU for three weeks  . HERNIA REPAIR    . OVARIAN CYST REMOVAL Left 09/29/2015   Procedure: OVARIAN CYSTECTOMY;  Surgeon: Benjaman Kindler, MD;  Location: [facility];  Service: Gynecology;  Laterality: Left;  [name] VAGINAL HYSTERECTOMY N/A 09/29/2015   Procedure: HYSTERECTOMY VAGINAL;  Surgeon: Benjaman Kindler, MD;  Location: [facility];  Service: Gynecology;  Laterality: N/A;    FAMILY HISTORY Family History  Problem Relation Age of Onset  . Arthritis/Rheumatoid Mother   . Heart block Mother   . Clotting disorder Mother   . Osteoporosis Mother   . Heart attack Mother   . Anxiety disorder Mother   . Depression Mother   . Heart disease Father   . Heart attack Father   . Depression Sister   . Anxiety disorder Sister        ADVANCED DIRECTIVES:    HEALTH MAINTENANCE: Social History  Substance Use Topics  . Smoking status: Never Smoker  . Smokeless tobacco: Never Used  . Alcohol use No     Colonoscopy:  PAP:  Bone density:  Lipid panel:  Allergies  Allergen Reactions  . Amoxicillin Anaphylaxis, Hives, Shortness Of Breath, Swelling and Rash    Has patient had a PCN reaction causing immediate rash, facial/tongue/throat swelling, SOB or lightheadedness with hypotension: Yes Has patient had a PCN reaction causing severe rash involving mucus membranes or skin necrosis: Yes Has patient had a PCN reaction that required hospitalization No  Has patient had a PCN reaction occurring within the last 10 years: No If all of the above answers are "NO", then may proceed with Cephalosporin use. Other reaction(s): ANAPHYLAXIS Other reaction(s): ANAPHYLAX  . Penicillins Anaphylaxis, Swelling and Rash    Has patient had a PCN reaction causing immediate rash, facial/tongue/throat swelling, SOB or lightheadedness with hypotension: Yes Has patient had a PCN  reaction causing severe rash involving mucus membranes or skin necrosis: Yes Has patient had a PCN reaction that required hospitalization No Has patient had a PCN reaction occurring within the last 10 years: No If all of the above answers are "NO", then may proceed with Cephalosporin use.   [name] Morphine Other (See Comments)    Muscle spasms  . Sulfa Antibiotics     Other reaction(s): VOMITING    Current Outpatient Prescriptions  Medication Sig Dispense Refill  . acetaminophen (TYLENOL) 325 MG tablet Take 650 mg by mouth every 4 (four) hours as needed for moderate pain.     [name] buPROPion (WELLBUTRIN XL) 150 MG 24 hr tablet Take 3 tablets (450 mg total) by mouth daily. 90 tablet 2  . cyanocobalamin (,VITAMIN B-12,) 1000 MCG/ML injection Inject 1,000 mcg into the muscle every 30 (thirty) days.     . diclofenac sodium (VOLTAREN) 1 % GEL APPLY 4 GMS EVERY 6 HOURS AS NEEDED FOR PAIN    . docusate sodium (COLACE) 100 MG capsule Take 1 capsule (100 mg total) by mouth daily as needed for mild constipation. 60 capsule 3  . GLUCAGON EMERGENCY 1 MG injection INJECT 1 MG INTO THE MUSCLE ONCE AS NEEDED FOR LOW BLOOD SUGAR.  11  . Multiple Vitamins-Minerals (HM MULTIVITAMIN ADULT GUMMY) CHEW Chew 2 tablets by mouth daily.    [name] oxyCODONE-acetaminophen (PERCOCET) 5-325 MG tablet Take 1-2 tablets by mouth every 6 (six) hours as needed for severe pain. 20 tablet 0  . pantoprazole (PROTONIX) 40 MG tablet TAKE 1 TABLET (40 MG TOTAL) BY MOUTH EVERY 12 (TWELVE) HOURS.  8  . sertraline (ZOLOFT) 100 MG tablet Take 2 tablets by mouth daily. 60 tablet 2  . sucralfate (CARAFATE) 1 G tablet TAKE 1 TABLET (1 G TOTAL) BY MOUTH 4 (FOUR) TIMES DAILY BEFORE MEALS AND NIGHTLY.  7  . SUMAtriptan (IMITREX) 100 MG tablet Take 100 mg by mouth every 2 (two) hours as needed for migraine or headache.     . thiamine (VITAMIN B-1) 100 MG tablet Take 100 mg by mouth.    . topiramate (TOPAMAX) 25 MG tablet Take 75 mg by mouth 2 (two) times  daily.     . traMADol (ULTRAM) 50 MG tablet TAKE 50-100 MG EVERY 4 HOURS AS NEEDED FOR PAIN NO MORE THAN 8 TABS IN 24 HOURS  0  . traZODone (DESYREL) 50 MG tablet Take 1 tablet (50 mg total) by mouth at bedtime. 30 tablet 1  . zolpidem (AMBIEN) 10 MG tablet Take 1 tablet (10 mg total) by mouth at bedtime as needed for sleep. 30 tablet 2   No current facility-administered medications for this visit.     OBJECTIVE: There were no vitals filed for this visit.   There is no height or weight on file to calculate BMI.    ECOG FS:0 - Asymptomatic  General: Well-developed, well-nourished, no acute distress. Eyes: Pink conjunctiva, anicteric sclera. Lungs: Clear to auscultation bilaterally. Heart: Regular rate and rhythm. No rubs, murmurs, or gallops. Abdomen: Soft, nontender, nondistended. No organomegaly noted, normoactive bowel sounds. Musculoskeletal: No edema, cyanosis, or  clubbing. Neuro: Alert, answering all questions appropriately. Cranial nerves grossly intact. Skin: No rashes or petechiae noted. Psych: Normal affect.   LAB RESULTS:  Lab Results  Component Value Date   NA 141 10/01/2015   K 3.2 (L) 10/01/2015   CL 113 (H) 10/01/2015   CO2 22 10/01/2015   GLUCOSE 97 10/01/2015   BUN 6 10/01/2015   CREATININE 0.56 10/01/2015   CALCIUM 8.1 (L) 10/01/2015   PROT 7.5 08/07/2015   ALBUMIN 4.0 08/07/2015   AST 32 08/07/2015   ALT 28 08/07/2015   ALKPHOS 54 08/07/2015   BILITOT 0.5 08/07/2015   GFRNONAA >60 10/01/2015   GFRAA >60 10/01/2015    Lab Results  Component Value Date   WBC 5.4 02/18/2016   NEUTROABS 3.5 02/18/2016   HGB 12.1 02/18/2016   HCT 37.6 02/18/2016   MCV 85.9 02/18/2016   PLT 342 02/18/2016   Lab Results  Component Value Date   FERRITIN 6 (L) 02/18/2016   Lab Results  Component Value Date   IRON 42 02/18/2016   TIBC 445 02/18/2016   IRONPCTSAT 9 (L) 02/18/2016      STUDIES: No results found.  ASSESSMENT: Iron deficiency anemia.  PLAN:      1. 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  Patient's hemoglobin is within normal limits, but her iron stores have declined and she is symptomatic. Proceed with one infusion of 510 mg IV Feraheme today. Return to clinic in 4 months with repeat laboratory work and further evaluation. 2. Rheumatoid arthritis: Continued workup to rheumatology. 3. Depression/anxiety:  Continue current medications as prescribed. 4. Dizziness: Unlikely related to iron deficiency, monitor.  Patient expressed understanding and was in agreement with this plan. She also understands that She can call clinic at any time with any questions, concerns, or complaints.     Lloyd Huger, MD   06/27/2016 9:42 PM

## 2016-06-28 ENCOUNTER — Inpatient Hospital Stay: Payer: Medicare Other

## 2016-06-28 ENCOUNTER — Inpatient Hospital Stay: Payer: Medicare Other | Admitting: Oncology

## 2016-06-28 ENCOUNTER — Telehealth: Payer: Self-pay

## 2016-06-28 NOTE — Telephone Encounter (Signed)
Certified letter has been sent to patient since she has no showed three appointments

## 2016-07-22 ENCOUNTER — Ambulatory Visit: Payer: Medicare Other | Admitting: Psychiatry

## 2016-08-04 ENCOUNTER — Ambulatory Visit (INDEPENDENT_AMBULATORY_CARE_PROVIDER_SITE_OTHER): Payer: Medicare Other | Admitting: Psychiatry

## 2016-08-04 ENCOUNTER — Encounter: Payer: Self-pay | Admitting: Psychiatry

## 2016-08-04 VITALS — BP 138/79 | HR 105 | Temp 98.4°F | Wt 150.6 lb

## 2016-08-04 DIAGNOSIS — F410 Panic disorder [episodic paroxysmal anxiety] without agoraphobia: Secondary | ICD-10-CM | POA: Diagnosis not present

## 2016-08-04 DIAGNOSIS — F331 Major depressive disorder, recurrent, moderate: Secondary | ICD-10-CM

## 2016-08-04 MED ORDER — SERTRALINE HCL 100 MG PO TABS: ORAL_TABLET | ORAL | 2 refills | Status: DC

## 2016-08-04 MED ORDER — ZOLPIDEM TARTRATE 10 MG PO TABS: 10.0000 mg | ORAL_TABLET | Freq: Every evening | ORAL | 0 refills | Status: DC | PRN

## 2016-08-04 MED ORDER — BUPROPION HCL ER (XL) 150 MG PO TB24: 450.0000 mg | ORAL_TABLET | Freq: Every day | ORAL | 2 refills | Status: DC

## 2016-08-04 MED ORDER — HYDROXYZINE HCL 10 MG PO TABS: 10.0000 mg | ORAL_TABLET | Freq: Two times a day (BID) | ORAL | 1 refills | Status: DC | PRN

## 2016-08-04 NOTE — Progress Notes (Signed)
Patient ID: [name], female   DOB: [birth year], 41 y.o.   MRN: [id number]  [facility] MD/PA/NP OP Progress Note  08/04/2016 1:52 PM [name]  MRN:  [id number]  Subjective:  Patient returns for follow-up for major depressive disorder and panic disorder. Patient comes in with HER-31-year-old daughter. Reports that she is doing okay but has times when she is not able to function well. Reports a lot of stressors with finances, her in-laws and also her husband. States that she does need something for anxiety. States that she did call [name] but the office was closed until June 18 and she is been unable to get in. She is going to be working on seeing [name] again. Denies any suicidal thoughts.   Chief Complaint: better Chief Complaint    Follow-up; Medication Refill     Visit Diagnosis:     ICD-10-CM   1. Major depressive disorder, recurrent episode, moderate (HCC) F33.1   2. Panic disorder F41.0     Past Medical History:  Past Medical History:  Diagnosis Date  . Anxiety   . Complication of anesthesia    difficulty to get sedated during endoscopy  . Depression   . Headache   . Neuropathy   . Pneumonia    within past five years  . PTSD (post-traumatic stress disorder)   . Seizures (HCC)     Past Surgical History:  Procedure Laterality Date  . ABDOMINAL HYSTERECTOMY    . BILATERAL SALPINGECTOMY Bilateral 09/29/2015   Procedure: BILATERAL SALPINGECTOMY;  Surgeon: [name], MD;  Location: [facility] ORS;  Service: Gynecology;  Laterality: Bilateral;  . bowel obstruction  2011  . CHOLECYSTECTOMY    . GASTRIC BYPASS    . gi bleed  2009   Surgery-was in ICU for three weeks  . HERNIA REPAIR    . OVARIAN CYST REMOVAL Left 09/29/2015   Procedure: OVARIAN CYSTECTOMY;  Surgeon: [name], MD;  Location: [facility] ORS;  Service: Gynecology;  Laterality: Left;  [name] VAGINAL HYSTERECTOMY N/A 09/29/2015   Procedure: HYSTERECTOMY VAGINAL;  Surgeon: [name], MD;  Location:  [facility] ORS;  Service: Gynecology;  Laterality: N/A;   Family History:  Family History  Problem Relation Age of Onset  . Arthritis/Rheumatoid Mother   . Heart block Mother   . Clotting disorder Mother   . Osteoporosis Mother   . Heart attack Mother   . Anxiety disorder Mother   . Depression Mother   . Heart disease Father   . Heart attack Father   . Depression Sister   . Anxiety disorder Sister    Social History:  Social History   Social History  . Marital status: Single    Spouse name: N/A  . Number of children: N/A  . Years of education: N/A   Social History Main Topics  . Smoking status: Never Smoker  . Smokeless tobacco: Never Used  . Alcohol use No  . Drug use: No  . Sexual activity: Yes    Birth control/ protection: Surgical   Other Topics Concern  . None   Social History Narrative  . None   Additional History:   Assessment:   Musculoskeletal: Strength & Muscle Tone: within normal limits Gait & Station: normal Patient leans: N/A  Psychiatric Specialty Exam: Medication Refill     Review of Systems  Psychiatric/Behavioral: Negative for depression, hallucinations, memory loss, substance abuse and suicidal ideas. The patient does not have insomnia. Nervous/anxious: excessive thoughts about harm coming to relatives, comments or parents  are getting older.   All other systems reviewed and are negative.   Blood pressure 138/79, pulse (!) 105, temperature 98.4 F (36.9 C), temperature source Oral, weight 150 lb 9.6 oz (68.3 kg).Body mass index is 23.59 kg/m.  General Appearance: Well Groomed  Eye Contact:  Good  Speech:  Normal Rate  Volume:  Normal  Mood:  better  Affect:  pleasant  Thought Process:  Linear and Logical  Orientation:  Full (Time, Place, and Person)  Thought Content:  Negative  Suicidal Thoughts:  No  Homicidal Thoughts:  No  Memory:  Immediate;   Good Recent;   Good Remote;   Good  Judgement:  Good  Insight:  Good  Psychomotor  Activity:  Negative  Concentration:  Good  Recall:  Good  Fund of Knowledge: Good  Language: Good  Akathisia:  Negative  Handed:  Right  AIMS (if indicated):  N/A  Assets:  Communication Skills Desire for Improvement Social Support  ADL's:  Intact  Cognition: WNL  Sleep:  ok with Ambien   Is the patient at risk to self?  No. Has the patient been a risk to self in the past 6 months?  No. Has the patient been a risk to self within the distant past?  No. Is the patient a risk to others?  No. Has the patient been a risk to others in the past 6 months?  No. Has the patient been a risk to others within the distant past?  No.  Current Medications: Current Outpatient Prescriptions  Medication Sig Dispense Refill  . acetaminophen (TYLENOL) 325 MG tablet Take 650 mg by mouth every 4 (four) hours as needed for moderate pain.     [name] buPROPion (WELLBUTRIN XL) 150 MG 24 hr tablet Take 3 tablets (450 mg total) by mouth daily. 90 tablet 2  . cyanocobalamin (,VITAMIN B-12,) 1000 MCG/ML injection Inject 1,000 mcg into the muscle every 30 (thirty) days.     . diclofenac sodium (VOLTAREN) 1 % GEL APPLY 4 GMS EVERY 6 HOURS AS NEEDED FOR PAIN    . docusate sodium (COLACE) 100 MG capsule Take 1 capsule (100 mg total) by mouth daily as needed for mild constipation. 60 capsule 3  . GLUCAGON EMERGENCY 1 MG injection INJECT 1 MG INTO THE MUSCLE ONCE AS NEEDED FOR LOW BLOOD SUGAR.  11  . Multiple Vitamins-Minerals (HM MULTIVITAMIN ADULT GUMMY) CHEW Chew 2 tablets by mouth daily.    [name] oxyCODONE-acetaminophen (PERCOCET) 5-325 MG tablet Take 1-2 tablets by mouth every 6 (six) hours as needed for severe pain. 20 tablet 0  . pantoprazole (PROTONIX) 40 MG tablet TAKE 1 TABLET (40 MG TOTAL) BY MOUTH EVERY 12 (TWELVE) HOURS.  8  . sertraline (ZOLOFT) 100 MG tablet Take 2 tablets by mouth daily. 60 tablet 2  . sucralfate (CARAFATE) 1 G tablet TAKE 1 TABLET (1 G TOTAL) BY MOUTH 4 (FOUR) TIMES DAILY BEFORE MEALS AND  NIGHTLY.  7  . SUMAtriptan (IMITREX) 100 MG tablet Take 100 mg by mouth every 2 (two) hours as needed for migraine or headache.     . thiamine (VITAMIN B-1) 100 MG tablet Take 100 mg by mouth.    . traMADol (ULTRAM) 50 MG tablet TAKE 50-100 MG EVERY 4 HOURS AS NEEDED FOR PAIN NO MORE THAN 8 TABS IN 24 HOURS  0  . traZODone (DESYREL) 50 MG tablet Take 1 tablet (50 mg total) by mouth at bedtime. 30 tablet 1  . zolpidem (AMBIEN) 10 MG tablet  Take 1 tablet (10 mg total) by mouth at bedtime as needed for sleep. 30 tablet 2  . topiramate (TOPAMAX) 25 MG tablet Take 75 mg by mouth 2 (two) times daily.      No current facility-administered medications for this visit.     Medical Decision Making:  Established Problem, Stable/Improving (1), Review of Medication Regimen & Side Effects (2) and Review of New Medication or Change in Dosage (2)  Treatment Plan Summary:Medication management and Plan   Major depressive disorder, recurrent, moderate- Continue Wellbutrin XL 450 mg.  Continue sertraline at 200 mg daily Start seeing [name] for therapy and address her issues, develop coping skills for anxiety.   Insomnia- continue Ambien 10 mg at bedtime as needed. Discontinue trazodone at 50mg  po qhs.  Panic disorder without or phobia- Same as above Start hydroxyzine at 10mg  po bid as needed for anxiety.   Patient will follow up in 8 weeks. She's been encouraged call any questions or concerns prior to her next appointment.   [name] 08/04/2016, 1:52 PM

## 2016-10-06 ENCOUNTER — Ambulatory Visit: Payer: Medicare Other | Admitting: Psychiatry

## 2016-10-14 ENCOUNTER — Ambulatory Visit: Payer: Medicare Other | Admitting: Psychiatry

## 2016-10-19 ENCOUNTER — Encounter: Payer: Self-pay | Admitting: Psychiatry

## 2016-10-19 ENCOUNTER — Ambulatory Visit (INDEPENDENT_AMBULATORY_CARE_PROVIDER_SITE_OTHER): Payer: Medicare Other | Admitting: Psychiatry

## 2016-10-19 VITALS — BP 112/76 | HR 108 | Temp 98.4°F | Wt 156.8 lb

## 2016-10-19 DIAGNOSIS — F331 Major depressive disorder, recurrent, moderate: Secondary | ICD-10-CM

## 2016-10-19 MED ORDER — SERTRALINE HCL 100 MG PO TABS: ORAL_TABLET | ORAL | 2 refills | Status: DC

## 2016-10-19 MED ORDER — BUPROPION HCL ER (XL) 150 MG PO TB24: 450.0000 mg | ORAL_TABLET | Freq: Every day | ORAL | 2 refills | Status: DC

## 2016-10-19 MED ORDER — ESZOPICLONE 2 MG PO TABS: 2.0000 mg | ORAL_TABLET | Freq: Every evening | ORAL | 1 refills | Status: DC | PRN

## 2016-10-19 NOTE — Progress Notes (Signed)
Patient ID: [name], female   DOB: 04-07-1975, 41 y.o.   MRN: [id number]  [facility] MD/PA/NP OP Progress Note  10/19/2016 1:44 PM SHANOVIA BUSCHMANN  MRN:  [id number]  Subjective:  Patient returns for follow-up for major depressive disorder and panic disorder. Patient comes in with HER-39-year-old daughter. Patient reports that she seems to be doing okay mood wise. Thinks a better with her family. However reports that the she is having trouble sleeping and would try to and would like to try the Lunesta likely discussed previously. States that on the Ambien she is barely able to get 2 hours of sleep.   Chief Complaint: better Chief Complaint    Follow-up; Medication Refill     Visit Diagnosis:     ICD-10-CM   1. Major depressive disorder, recurrent episode, moderate (HCC) F33.1     Past Medical History:  Past Medical History:  Diagnosis Date  . Anxiety   . Complication of anesthesia    difficulty to get sedated during endoscopy  . Depression   . Headache   . Neuropathy   . Pneumonia    within past five years  . PTSD (post-traumatic stress disorder)   . Seizures (HCC)     Past Surgical History:  Procedure Laterality Date  . ABDOMINAL HYSTERECTOMY    . BILATERAL SALPINGECTOMY Bilateral 09/29/2015   Procedure: BILATERAL SALPINGECTOMY;  Surgeon: [name], MD;  Location: [facility] OR[facility];  Service: Gynecology;  Laterality: Bilateral;  . bowel obstruction  2011  . CHOLECYSTECTOMY    . GASTRIC BYPASS    . gi bleed  2009   Surgery-was in ICU for three weeks  . HERNIA REPAIR    . OVARIAN CYST REMOVAL Left 09/29/2015   Procedure: OVARIAN CYSTECTOMY;  Surgeon: [name], MD;  Location: [facility] ORS;  Service: Gynecology;  Laterality: Left;  [name] VAGINAL HYSTERECTOMY N/A 09/29/2015   Procedure: HYSTERECTOMY VAGINAL;  Surgeon: [name], MD;  Location: [facility] ORS;  Service: Gynecology;  Laterality: N/A;   Family History:  Family History  Problem Relation Age of Onset  .  Arthritis/Rheumatoid Mother   . Heart block Mother   . Clotting disorder Mother   . Osteoporosis Mother   . Heart attack Mother   . Anxiety disorder Mother   . Depression Mother   . Heart disease Father   . Heart attack Father   . Depression Sister   . Anxiety disorder Sister    Social History:  Social History   Social History  . Marital status: Single    Spouse name: N/A  . Number of children: N/A  . Years of education: N/A   Social History Main Topics  . Smoking status: Never Smoker  . Smokeless tobacco: Never Used  . Alcohol use No  . Drug use: No  . Sexual activity: Yes    Birth control/ protection: Surgical   Other Topics Concern  . None   Social History Narrative  . None   Additional History:   Assessment:   Musculoskeletal: Strength & Muscle Tone: within normal limits Gait & Station: normal Patient leans: N/A  Psychiatric Specialty Exam: Medication Refill     Review of Systems  Psychiatric/Behavioral: Negative for depression, hallucinations, memory loss, substance abuse and suicidal ideas. The patient does not have insomnia. Nervous/anxious: excessive thoughts about harm coming to relatives, comments or parents are getting older.   All other systems reviewed and are negative.   Blood pressure 112/76, pulse (!) 108, temperature 98.4 F (36.9 C), temperature  source Oral, weight 156 lb 12.8 oz (71.1 kg).Body mass index is 24.56 kg/m.  General Appearance: Well Groomed  Eye Contact:  Good  Speech:  Normal Rate  Volume:  Normal  Mood:  better  Affect:  pleasant  Thought Process:  Linear and Logical  Orientation:  Full (Time, Place, and Person)  Thought Content:  Negative  Suicidal Thoughts:  No  Homicidal Thoughts:  No  Memory:  Immediate;   Good Recent;   Good Remote;   Good  Judgement:  Good  Insight:  Good  Psychomotor Activity:  Negative  Concentration:  Good  Recall:  Good  Fund of Knowledge: Good  Language: Good  Akathisia:  Negative   Handed:  Right  AIMS (if indicated):  N/A  Assets:  Communication Skills Desire for Improvement Social Support  ADL's:  Intact  Cognition: WNL  Sleep:  ok with Ambien   Is the patient at risk to self?  No. Has the patient been a risk to self in the past 6 months?  No. Has the patient been a risk to self within the distant past?  No. Is the patient a risk to others?  No. Has the patient been a risk to others in the past 6 months?  No. Has the patient been a risk to others within the distant past?  No.  Current Medications: Current Outpatient Prescriptions  Medication Sig Dispense Refill  . acetaminophen (TYLENOL) 325 MG tablet Take 650 mg by mouth every 4 (four) hours as needed for moderate pain.     [name] buPROPion (WELLBUTRIN XL) 150 MG 24 hr tablet Take 3 tablets (450 mg total) by mouth daily. 90 tablet 2  . cyanocobalamin (,VITAMIN B-12,) 1000 MCG/ML injection Inject 1,000 mcg into the muscle every 30 (thirty) days.     . diclofenac sodium (VOLTAREN) 1 % GEL APPLY 4 GMS EVERY 6 HOURS AS NEEDED FOR PAIN    . docusate sodium (COLACE) 100 MG capsule Take 1 capsule (100 mg total) by mouth daily as needed for mild constipation. 60 capsule 3  . GLUCAGON EMERGENCY 1 MG injection INJECT 1 MG INTO THE MUSCLE ONCE AS NEEDED FOR LOW BLOOD SUGAR.  11  . hydrOXYzine (ATARAX/VISTARIL) 10 MG tablet Take 1 tablet (10 mg total) by mouth 2 (two) times daily as needed. 30 tablet 1  . Multiple Vitamins-Minerals (HM MULTIVITAMIN ADULT GUMMY) CHEW Chew 2 tablets by mouth daily.    [name] oxyCODONE-acetaminophen (PERCOCET) 5-325 MG tablet Take 1-2 tablets by mouth every 6 (six) hours as needed for severe pain. 20 tablet 0  . pantoprazole (PROTONIX) 40 MG tablet TAKE 1 TABLET (40 MG TOTAL) BY MOUTH EVERY 12 (TWELVE) HOURS.  8  . sertraline (ZOLOFT) 100 MG tablet Take 2 tablets by mouth daily. 60 tablet 2  . sucralfate (CARAFATE) 1 G tablet TAKE 1 TABLET (1 G TOTAL) BY MOUTH 4 (FOUR) TIMES DAILY BEFORE MEALS AND  NIGHTLY.  7  . SUMAtriptan (IMITREX) 100 MG tablet Take 100 mg by mouth every 2 (two) hours as needed for migraine or headache.     . thiamine (VITAMIN B-1) 100 MG tablet Take 100 mg by mouth.    . traMADol (ULTRAM) 50 MG tablet TAKE 50-100 MG EVERY 4 HOURS AS NEEDED FOR PAIN NO MORE THAN 8 TABS IN 24 HOURS  0  . traZODone (DESYREL) 50 MG tablet Take 1 tablet (50 mg total) by mouth at bedtime. 30 tablet 1  . eszopiclone (LUNESTA) 2 MG TABS tablet Take  1 tablet (2 mg total) by mouth at bedtime as needed for sleep. Take immediately before bedtime 30 tablet 1  . topiramate (TOPAMAX) 25 MG tablet Take 75 mg by mouth 2 (two) times daily.      No current facility-administered medications for this visit.     Medical Decision Making:  Established Problem, Stable/Improving (1), Review of Medication Regimen & Side Effects (2) and Review of New Medication or Change in Dosage (2)  Treatment Plan Summary:Medication management and Plan   Major depressive disorder, recurrent, moderate- Continue Wellbutrin XL 450 mg.  Continue sertraline at 200 mg daily Start seeing Inetta Fermo for therapy and address her issues, develop coping skills for anxiety.   Insomnia- Discontinue Ambien. Start Lunesta at 2mg  po qhs.  Panic disorder without or phobia- Same as above Discontinue hydroxyzine at 10mg  po bid as needed for anxiety.   Patient will follow up in 8 weeks. She's been encouraged call any questions or concerns prior to her next appointment.   Corley Kohls 10/19/2016, 1:44 PM

## 2016-10-20 ENCOUNTER — Encounter: Payer: Self-pay | Admitting: Oncology

## 2016-10-20 ENCOUNTER — Telehealth: Payer: Self-pay

## 2016-10-20 NOTE — Telephone Encounter (Signed)
pt called states you gave her the lunesta yesterday and it did not work did not notice a difference at all she was wondering if she could increase dosage.

## 2016-10-21 NOTE — Telephone Encounter (Signed)
ok 

## 2016-10-21 NOTE — Telephone Encounter (Signed)
pt called back . she stated that she would try the ambien 10mg  again to send to [facility] on [address]

## 2016-10-21 NOTE — Telephone Encounter (Signed)
left message that per dr. Einar Grad increase [name] would not make much difference and ask her to call us back if she wanted to go back on ambien or trazodone.

## 2016-10-21 NOTE — Telephone Encounter (Signed)
I don't think the higher dosage will work for her. She will have to go back to Ambien or trazodone.

## 2016-10-26 ENCOUNTER — Other Ambulatory Visit (HOSPITAL_COMMUNITY): Payer: Self-pay | Admitting: Psychiatry

## 2016-10-26 MED ORDER — ZOLPIDEM TARTRATE ER 12.5 MG PO TBCR: 12.5000 mg | EXTENDED_RELEASE_TABLET | Freq: Every evening | ORAL | 1 refills | Status: DC | PRN

## 2016-10-26 NOTE — Telephone Encounter (Signed)
Cannot send the Ambien. She will have to pick it up

## 2016-10-26 NOTE — Telephone Encounter (Signed)
pt called to check on rx for Lorrin Mais it was not up front so pt needs rx she states she will pick up tomorrow.

## 2016-10-27 NOTE — Telephone Encounter (Signed)
ok 

## 2016-11-01 ENCOUNTER — Inpatient Hospital Stay: Payer: Medicare Other | Attending: Oncology

## 2016-11-01 DIAGNOSIS — Z9884 Bariatric surgery status: Secondary | ICD-10-CM | POA: Diagnosis not present

## 2016-11-01 DIAGNOSIS — Z79899 Other long term (current) drug therapy: Secondary | ICD-10-CM | POA: Diagnosis not present

## 2016-11-01 DIAGNOSIS — Z9049 Acquired absence of other specified parts of digestive tract: Secondary | ICD-10-CM | POA: Insufficient documentation

## 2016-11-01 DIAGNOSIS — Z90722 Acquired absence of ovaries, bilateral: Secondary | ICD-10-CM | POA: Insufficient documentation

## 2016-11-01 DIAGNOSIS — F329 Major depressive disorder, single episode, unspecified: Secondary | ICD-10-CM | POA: Insufficient documentation

## 2016-11-01 DIAGNOSIS — F419 Anxiety disorder, unspecified: Secondary | ICD-10-CM | POA: Insufficient documentation

## 2016-11-01 DIAGNOSIS — R5383 Other fatigue: Secondary | ICD-10-CM | POA: Diagnosis not present

## 2016-11-01 DIAGNOSIS — D509 Iron deficiency anemia, unspecified: Secondary | ICD-10-CM | POA: Insufficient documentation

## 2016-11-01 DIAGNOSIS — Z90710 Acquired absence of both cervix and uterus: Secondary | ICD-10-CM | POA: Diagnosis not present

## 2016-11-01 DIAGNOSIS — F4310 Post-traumatic stress disorder, unspecified: Secondary | ICD-10-CM | POA: Diagnosis not present

## 2016-11-01 DIAGNOSIS — M069 Rheumatoid arthritis, unspecified: Secondary | ICD-10-CM | POA: Insufficient documentation

## 2016-11-01 DIAGNOSIS — D508 Other iron deficiency anemias: Secondary | ICD-10-CM

## 2016-11-01 LAB — CBC WITH DIFFERENTIAL/PLATELET
Basophils Absolute: 0 10*3/uL (ref 0–0.1)
Basophils Relative: 1 %
Eosinophils Absolute: 0.1 10*3/uL (ref 0–0.7)
Eosinophils Relative: 2 %
HCT: 39.7 % (ref 35.0–47.0)
Hemoglobin: 13.5 g/dL (ref 12.0–16.0)
Lymphocytes Relative: 21 %
Lymphs Abs: 1.1 10*3/uL (ref 1.0–3.6)
MCH: 30.7 pg (ref 26.0–34.0)
MCHC: 33.9 g/dL (ref 32.0–36.0)
MCV: 90.4 fL (ref 80.0–100.0)
Monocytes Absolute: 0.3 10*3/uL (ref 0.2–0.9)
Monocytes Relative: 6 %
Neutro Abs: 3.6 10*3/uL (ref 1.4–6.5)
Neutrophils Relative %: 70 %
Platelets: 319 10*3/uL (ref 150–440)
RBC: 4.39 MIL/uL (ref 3.80–5.20)
RDW: 12.7 % (ref 11.5–14.5)
WBC: 5.1 10*3/uL (ref 3.6–11.0)

## 2016-11-01 LAB — FERRITIN: Ferritin: 14 ng/mL (ref 11–307)

## 2016-11-01 LAB — IRON AND TIBC
Iron: 154 ug/dL (ref 28–170)
Saturation Ratios: 29 % (ref 10.4–31.8)
TIBC: 530 ug/dL — ABNORMAL HIGH (ref 250–450)
UIBC: 376 ug/dL

## 2016-11-02 NOTE — Progress Notes (Signed)
[place]  [phone] [phone]  ID: [name] OB: 09/10/1975  MR#: [id number]  KGM#:[id number]  Patient Care Team: Leonard Downing, MD as PCP - General (Family Medicine)  CHIEF COMPLAINT: Iron deficiency anemia.   INTERVAL HISTORY: Patient last evaluated in January 2018. She returns to clinic today after complaining of recent increase of weakness and fatigue. She has multiple medical complaints that are chronic and unchanged. She has occasional dizziness, but no neurologic complaints.  She has had no recent fevers or illnesses.  She denies any chest pain or shortness of breath.  She has a good appetite and denies any nausea, vomiting, constipation, or diarrhea.  She has no melena or hematochezia.  She has no urinary complaints.  Patient offers no further specific complaints today.    REVIEW OF SYSTEMS:   Review of Systems  Constitutional: Positive for malaise/fatigue. Negative for fever and weight loss.  Respiratory: Negative.  Negative for cough and shortness of breath.   Cardiovascular: Negative.  Negative for chest pain and leg swelling.  Gastrointestinal: Negative.  Negative for abdominal pain, blood in stool and melena.  Genitourinary: Negative.  Negative for hematuria.  Musculoskeletal: Positive for back pain, joint pain and neck pain.  Skin: Negative.  Negative for rash.  Neurological: Positive for dizziness and weakness.  Psychiatric/Behavioral: Positive for depression. The patient is not nervous/anxious.     As per HPI. Otherwise, a complete review of systems is negative.  PAST MEDICAL HISTORY: Past Medical History:  Diagnosis Date  . Anxiety   . Complication of anesthesia    difficulty to get sedated during endoscopy  . Depression   . Headache   . Neuropathy   . Pneumonia    within past five years  . PTSD (post-traumatic stress disorder)   . Seizures ([place])     PAST SURGICAL HISTORY: Past Surgical  History:  Procedure Laterality Date  . ABDOMINAL HYSTERECTOMY    . BILATERAL SALPINGECTOMY Bilateral 09/29/2015   Procedure: BILATERAL SALPINGECTOMY;  Surgeon: Benjaman Kindler, MD;  Location: [facility] ORS;  Service: Gynecology;  Laterality: Bilateral;  . bowel obstruction  2011  . CHOLECYSTECTOMY    . GASTRIC BYPASS    . gi bleed  2009   Surgery-was in ICU for three weeks  . HERNIA REPAIR    . OVARIAN CYST REMOVAL Left 09/29/2015   Procedure: OVARIAN CYSTECTOMY;  Surgeon: Benjaman Kindler, MD;  Location: [facility] OR[facility];  Service: Gynecology;  Laterality: Left;  [name] VAGINAL HYSTERECTOMY N/A 09/29/2015   Procedure: HYSTERECTOMY VAGINAL;  Surgeon: Benjaman Kindler, MD;  Location: [facility] OR[facility];  Service: Gynecology;  Laterality: N/A;    FAMILY HISTORY Family History  Problem Relation Age of Onset  . Arthritis/Rheumatoid Mother   . Heart block Mother   . Clotting disorder Mother   . Osteoporosis Mother   . Heart attack Mother   . Anxiety disorder Mother   . Depression Mother   . Heart disease Father   . Heart attack Father   . Depression Sister   . Anxiety disorder Sister        ADVANCED DIRECTIVES:    HEALTH MAINTENANCE: Social History  Substance Use Topics  . Smoking status: Never Smoker  . Smokeless tobacco: Never Used  . Alcohol use No     Colonoscopy:  PAP:  Bone density:  Lipid panel:  Allergies  Allergen Reactions  . Amoxicillin Anaphylaxis, Hives, Shortness Of Breath, Swelling and Rash    Has patient had a PCN reaction causing  immediate rash, facial/tongue/throat swelling, SOB or lightheadedness with hypotension: Yes Has patient had a PCN reaction causing severe rash involving mucus membranes or skin necrosis: Yes Has patient had a PCN reaction that required hospitalization No Has patient had a PCN reaction occurring within the last 10 years: No If all of the above answers are "NO", then may proceed with Cephalosporin use. Other reaction(s): ANAPHYLAXIS Other reaction(s):  ANAPHYLAX  . Penicillins Anaphylaxis, Swelling and Rash    Has patient had a PCN reaction causing immediate rash, facial/tongue/throat swelling, SOB or lightheadedness with hypotension: Yes Has patient had a PCN reaction causing severe rash involving mucus membranes or skin necrosis: Yes Has patient had a PCN reaction that required hospitalization No Has patient had a PCN reaction occurring within the last 10 years: No If all of the above answers are "NO", then may proceed with Cephalosporin use.   [name] Morphine Other (See Comments)    Muscle spasms  . Sulfa Antibiotics     Other reaction(s): VOMITING    Current Outpatient Prescriptions  Medication Sig Dispense Refill  . acetaminophen (TYLENOL) 325 MG tablet Take 650 mg by mouth every 4 (four) hours as needed for moderate pain.     [name] buPROPion (WELLBUTRIN XL) 150 MG 24 hr tablet Take 3 tablets (450 mg total) by mouth daily. 90 tablet 2  . cyanocobalamin (,VITAMIN B-12,) 1000 MCG/ML injection Inject 1,000 mcg into the muscle every 30 (thirty) days.     . diclofenac sodium (VOLTAREN) 1 % GEL APPLY 4 GMS EVERY 6 HOURS AS NEEDED FOR PAIN    . docusate sodium (COLACE) 100 MG capsule Take 1 capsule (100 mg total) by mouth daily as needed for mild constipation. 60 capsule 3  . GLUCAGON EMERGENCY 1 MG injection INJECT 1 MG INTO THE MUSCLE ONCE AS NEEDED FOR LOW BLOOD SUGAR.  11  . hydrOXYzine (ATARAX/VISTARIL) 10 MG tablet Take 1 tablet (10 mg total) by mouth 2 (two) times daily as needed. 30 tablet 1  . Multiple Vitamins-Minerals (HM MULTIVITAMIN ADULT GUMMY) CHEW Chew 2 tablets by mouth daily.    . pantoprazole (PROTONIX) 40 MG tablet TAKE 1 TABLET (40 MG TOTAL) BY MOUTH EVERY 12 (TWELVE) HOURS.  8  . sertraline (ZOLOFT) 100 MG tablet Take 2 tablets by mouth daily. 60 tablet 2  . sucralfate (CARAFATE) 1 G tablet TAKE 1 TABLET (1 G TOTAL) BY MOUTH 4 (FOUR) TIMES DAILY BEFORE MEALS AND NIGHTLY.  7  . SUMAtriptan (IMITREX) 100 MG tablet Take 100 mg by  mouth every 2 (two) hours as needed for migraine or headache.     . thiamine (VITAMIN B-1) 100 MG tablet Take 100 mg by mouth.    . traMADol (ULTRAM) 50 MG tablet TAKE 50-100 MG EVERY 4 HOURS AS NEEDED FOR PAIN NO MORE THAN 8 TABS IN 24 HOURS  0  . traZODone (DESYREL) 50 MG tablet Take 1 tablet (50 mg total) by mouth at bedtime. 30 tablet 1  . zolpidem (AMBIEN CR) 12.5 MG CR tablet Take 1 tablet (12.5 mg total) by mouth at bedtime as needed for sleep. 30 tablet 1  . oxyCODONE-acetaminophen (PERCOCET) 5-325 MG tablet Take 1-2 tablets by mouth every 6 (six) hours as needed for severe pain. (Patient not taking: Reported on 11/03/2016) 20 tablet 0  . topiramate (TOPAMAX) 25 MG tablet Take 75 mg by mouth 2 (two) times daily. 1 tablet in the am, 2 tablets in the pm     No current facility-administered medications for  this visit.     OBJECTIVE: Vitals:   11/03/16 1428  BP: 113/81  Pulse: 97  Resp: 20     Body mass index is 23.95 kg/m.    ECOG FS:0 - Asymptomatic  General: Well-developed, well-nourished, no acute distress. Eyes: Pink conjunctiva, anicteric sclera. Lungs: Clear to auscultation bilaterally. Heart: Regular rate and rhythm. No rubs, murmurs, or gallops. Abdomen: Soft, nontender, nondistended. No organomegaly noted, normoactive bowel sounds. Musculoskeletal: No edema, cyanosis, or clubbing. Neuro: Alert, answering all questions appropriately. Cranial nerves grossly intact. Skin: No rashes or petechiae noted. Psych: Normal affect.   LAB RESULTS:  Lab Results  Component Value Date   NA 141 10/01/2015   K 3.2 (L) 10/01/2015   CL 113 (H) 10/01/2015   CO2 22 10/01/2015   GLUCOSE 97 10/01/2015   BUN 6 10/01/2015   CREATININE 0.56 10/01/2015   CALCIUM 8.1 (L) 10/01/2015   PROT 7.5 08/07/2015   ALBUMIN 4.0 08/07/2015   AST 32 08/07/2015   ALT 28 08/07/2015   ALKPHOS 54 08/07/2015   BILITOT 0.5 08/07/2015   GFRNONAA >60 10/01/2015   GFRAA >60 10/01/2015    Lab Results    Component Value Date   WBC 5.1 11/01/2016   NEUTROABS 3.6 11/01/2016   HGB 13.5 11/01/2016   HCT 39.7 11/01/2016   MCV 90.4 11/01/2016   PLT 319 11/01/2016   Lab Results  Component Value Date   FERRITIN 14 11/01/2016   Lab Results  Component Value Date   IRON 154 11/01/2016   TIBC 530 (H) 11/01/2016   IRONPCTSAT 29 11/01/2016      STUDIES: No results found.  ASSESSMENT: Iron deficiency anemia.  PLAN:    1. 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  Patient's hemoglobin and iron stores continue to be within normal limits. She last received one dose of IV Feraheme on February 19, 2016. She does not require additional IV iron today. After discussion with the patient, it was agreed upon that no further follow-up has been scheduled but she can call the clinic for laboratory check if she becomes more symptomatic in the future. 2. Rheumatoid arthritis: Continued workup to rheumatology. 3. Depression/anxiety:  Continue current medications as prescribed. 4. Dizziness: Unlikely related to iron deficiency, monitor.  Patient expressed understanding and was in agreement with this plan. She also understands that She can call clinic at any time with any questions, concerns, or complaints.     Lloyd Huger, MD   11/08/2016 2:58 PM

## 2016-11-03 ENCOUNTER — Inpatient Hospital Stay: Payer: Medicare Other

## 2016-11-03 ENCOUNTER — Inpatient Hospital Stay (HOSPITAL_BASED_OUTPATIENT_CLINIC_OR_DEPARTMENT_OTHER): Payer: Medicare Other | Admitting: Oncology

## 2016-11-03 VITALS — BP 113/81 | HR 97 | Resp 20 | Wt 152.9 lb

## 2016-11-03 DIAGNOSIS — F419 Anxiety disorder, unspecified: Secondary | ICD-10-CM | POA: Diagnosis not present

## 2016-11-03 DIAGNOSIS — M069 Rheumatoid arthritis, unspecified: Secondary | ICD-10-CM

## 2016-11-03 DIAGNOSIS — Z9884 Bariatric surgery status: Secondary | ICD-10-CM

## 2016-11-03 DIAGNOSIS — Z79899 Other long term (current) drug therapy: Secondary | ICD-10-CM | POA: Diagnosis not present

## 2016-11-03 DIAGNOSIS — D508 Other iron deficiency anemias: Secondary | ICD-10-CM

## 2016-11-03 DIAGNOSIS — D509 Iron deficiency anemia, unspecified: Secondary | ICD-10-CM

## 2016-11-03 DIAGNOSIS — R5383 Other fatigue: Secondary | ICD-10-CM | POA: Diagnosis not present

## 2016-11-03 DIAGNOSIS — F329 Major depressive disorder, single episode, unspecified: Secondary | ICD-10-CM | POA: Diagnosis not present

## 2016-11-03 NOTE — Progress Notes (Signed)
Patient reports weakness and dizziness.

## 2016-12-20 ENCOUNTER — Ambulatory Visit: Payer: Medicare Other | Admitting: Psychiatry

## 2016-12-27 ENCOUNTER — Ambulatory Visit (INDEPENDENT_AMBULATORY_CARE_PROVIDER_SITE_OTHER): Payer: Medicare Other | Admitting: Psychiatry

## 2016-12-27 ENCOUNTER — Encounter: Payer: Self-pay | Admitting: Psychiatry

## 2016-12-27 VITALS — BP 120/64 | HR 94 | Ht 67.0 in | Wt 151.0 lb

## 2016-12-27 DIAGNOSIS — F410 Panic disorder [episodic paroxysmal anxiety] without agoraphobia: Secondary | ICD-10-CM

## 2016-12-27 DIAGNOSIS — F331 Major depressive disorder, recurrent, moderate: Secondary | ICD-10-CM | POA: Diagnosis not present

## 2016-12-27 DIAGNOSIS — F4310 Post-traumatic stress disorder, unspecified: Secondary | ICD-10-CM | POA: Diagnosis not present

## 2016-12-27 MED ORDER — SERTRALINE HCL 100 MG PO TABS: ORAL_TABLET | ORAL | 2 refills | Status: DC

## 2016-12-27 MED ORDER — PROPRANOLOL HCL 10 MG PO TABS: 10.0000 mg | ORAL_TABLET | Freq: Three times a day (TID) | ORAL | 2 refills | Status: DC | PRN

## 2016-12-27 MED ORDER — BUPROPION HCL ER (XL) 150 MG PO TB24: 450.0000 mg | ORAL_TABLET | Freq: Every day | ORAL | 2 refills | Status: DC

## 2016-12-27 MED ORDER — ZOLPIDEM TARTRATE ER 12.5 MG PO TBCR: 12.5000 mg | EXTENDED_RELEASE_TABLET | Freq: Every evening | ORAL | 1 refills | Status: DC | PRN

## 2016-12-27 NOTE — Patient Instructions (Signed)
Propranolol extended-release capsules What is this medicine? PROPRANOLOL (proe PRAN oh lole) is a beta-blocker. Beta-blockers reduce the workload on the heart and help it to beat more regularly. This medicine is used to treat high blood pressure, heart muscle disease, and prevent chest pain caused by angina. It is also used to prevent migraine headaches. You should not use this medicine to treat a migraine that has already started. This medicine may be used for other purposes; ask your health care provider or pharmacist if you have questions. COMMON BRAND NAME(S): Inderal LA, Inderal XL, InnoPran XL What should I tell my health care provider before I take this medicine? They need to know if you have any of these conditions: -circulation problems, or blood vessel disease -diabetes -history of heart attack or heart disease, vasospastic angina -kidney disease -liver disease -lung or breathing disease, like asthma or emphysema -pheochromocytoma -slow heart rate -thyroid disease -an unusual or allergic reaction to propranolol, other beta-blockers, medicines, foods, dyes, or preservatives -pregnant or trying to get pregnant -breast-feeding How should I use this medicine? Take this medicine by mouth with a glass of water. Follow the directions on the prescription label. Do not crush or chew. Take your doses at regular intervals. Do not take your medicine more often than directed. Do not stop taking except on the advice of your doctor or health care professional. Talk to your pediatrician regarding the use of this medicine in children. Special care may be needed. Overdosage: If you think you have taken too much of this medicine contact a poison control center or emergency room at once. NOTE: This medicine is only for you. Do not share this medicine with others. What if I miss a dose? If you miss a dose, take it as soon as you can. If it is almost time for your next dose, take only that dose. Do not  take double or extra doses. What may interact with this medicine? Do not take this medicine with any of the following medications: -feverfew -phenothiazines like chlorpromazine, mesoridazine, prochlorperazine, thioridazine This medicine may also interact with the following medications: -aluminum hydroxide gel -antipyrine -antiviral medicines for HIV or AIDS -barbiturates like phenobarbital -certain medicines for blood pressure, heart disease, irregular heart beat -cimetidine -ciprofloxacin -diazepam -fluconazole -haloperidol -isoniazid -medicines for cholesterol like cholestyramine or colestipol -medicines for mental depression -medicines for migraine headache like almotriptan, eletriptan, frovatriptan, naratriptan, rizatriptan, sumatriptan, zolmitriptan -NSAIDs, medicines for pain and inflammation, like ibuprofen or naproxen -phenytoin -rifampin -teniposide -theophylline -thyroid medicines -tolbutamide -warfarin -zileuton This list may not describe all possible interactions. Give your health care provider a list of all the medicines, herbs, non-prescription drugs, or dietary supplements you use. Also tell them if you smoke, drink alcohol, or use illegal drugs. Some items may interact with your medicine. What should I watch for while using this medicine? Visit your doctor or health care professional for regular check ups. Contact your doctor right away if your symptoms worsen. Check your blood pressure and pulse rate regularly. Ask your health care professional what your blood pressure and pulse rate should be, and when you should contact them. Do not stop taking this medicine suddenly. This could lead to serious heart-related effects. You may get drowsy or dizzy. Do not drive, use machinery, or do anything that needs mental alertness until you know how this drug affects you. Do not stand or sit up quickly, especially if you are an older patient. This reduces the risk of dizzy or  fainting spells. Alcohol can   make you more drowsy and dizzy. Avoid alcoholic drinks. This medicine can affect blood sugar levels. If you have diabetes, check with your doctor or health care professional before you change your diet or the dose of your diabetic medicine. Do not treat yourself for coughs, colds, or pain while you are taking this medicine without asking your doctor or health care professional for advice. Some ingredients may increase your blood pressure. What side effects may I notice from receiving this medicine? Side effects that you should report to your doctor or health care professional as soon as possible: -allergic reactions like skin rash, itching or hives, swelling of the face, lips, or tongue -breathing problems -changes in blood sugar -cold hands or feet -difficulty sleeping, nightmares -dry peeling skin -hallucinations -muscle cramps or weakness -slow heart rate -swelling of the legs and ankles -vomiting Side effects that usually do not require medical attention (report to your doctor or health care professional if they continue or are bothersome): -change in sex drive or performance -diarrhea -dry sore eyes -hair loss -nausea -weak or tired This list may not describe all possible side effects. Call your doctor for medical advice about side effects. You may report side effects to FDA at 1-800-FDA-1088. Where should I keep my medicine? Keep out of the reach of children. Store at room temperature between 15 and 30 degrees C (59 and 86 degrees F). Protect from light, moisture and freezing. Keep container tightly closed. Throw away any unused medicine after the expiration date. NOTE: This sheet is a summary. It may not cover all possible information. If you have questions about this medicine, talk to your doctor, pharmacist, or health care provider.  2018 [employer]/Gold Standard (2012-10-06 14:58:56)  
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
## 2017-01-25 ENCOUNTER — Other Ambulatory Visit: Payer: Self-pay | Admitting: Family Medicine

## 2017-01-25 DIAGNOSIS — M545 Low back pain: Secondary | ICD-10-CM

## 2017-01-28 ENCOUNTER — Ambulatory Visit
Admission: RE | Admit: 2017-01-28 | Discharge: 2017-01-28 | Disposition: A | Discharge status: Home / Self Care | Payer: Medicare Other | Source: Ambulatory Visit | Attending: Family Medicine | Admitting: Family Medicine

## 2017-01-28 DIAGNOSIS — M545 Low back pain: Secondary | ICD-10-CM

## 2017-02-21 ENCOUNTER — Other Ambulatory Visit: Payer: Self-pay

## 2017-02-21 ENCOUNTER — Encounter: Payer: Self-pay | Admitting: Psychiatry

## 2017-02-21 ENCOUNTER — Ambulatory Visit (INDEPENDENT_AMBULATORY_CARE_PROVIDER_SITE_OTHER): Payer: Medicare Other | Admitting: Psychiatry

## 2017-02-21 VITALS — BP 106/70 | HR 70 | Temp 98.0°F | Wt 145.8 lb

## 2017-02-21 DIAGNOSIS — F4310 Post-traumatic stress disorder, unspecified: Secondary | ICD-10-CM

## 2017-02-21 DIAGNOSIS — F331 Major depressive disorder, recurrent, moderate: Secondary | ICD-10-CM | POA: Diagnosis not present

## 2017-02-21 DIAGNOSIS — F410 Panic disorder [episodic paroxysmal anxiety] without agoraphobia: Secondary | ICD-10-CM | POA: Diagnosis not present

## 2017-02-21 MED ORDER — ZOLPIDEM TARTRATE ER 12.5 MG PO TBCR: 12.5000 mg | EXTENDED_RELEASE_TABLET | Freq: Every evening | ORAL | 2 refills | Status: DC | PRN

## 2017-02-21 MED ORDER — ARIPIPRAZOLE 5 MG PO TABS: 5.0000 mg | ORAL_TABLET | Freq: Every day | ORAL | 2 refills | Status: DC

## 2017-02-21 MED ORDER — BUPROPION HCL ER (XL) 150 MG PO TB24: 300.0000 mg | ORAL_TABLET | Freq: Every day | ORAL | 2 refills | Status: DC

## 2017-02-21 MED ORDER — SERTRALINE HCL 100 MG PO TABS: ORAL_TABLET | ORAL | 2 refills | Status: DC

## 2017-02-21 NOTE — Progress Notes (Signed)
[facility] OP Progress Note  02/22/2017 9:02 AM [name]  MRN:  [id number]  Chief Complaint: ' I am so depressed.'  Chief Complaint    Follow-up; Medication Refill     HPI: [name] is a 42 year old Caucasian female, divorced, on SSD, lives in [place], has a history of depression and anxiety who presented to the clinic today for a follow-up visit.  [name] reports that she continues to be struggling with depression and anxiety.  She reports the holidays are usually hard for her.  She reports family can be stressful and getting through the holidays financially was also a struggle for her.  She reports that even though the holidays are over she continues to struggle with lack of motivation, anhedonia and sadness on a regular basis.  She reports the only motivation that she currently has in her life is her 43-year-old daughter 29.  She reports if it were not for her she would not have any motivation at all.   She reports she always feels there is a fog over her.  She struggles to stay focused.  She has tried to make a list of things to do but that also did not work.  She reports she is able to take care of her daughter however has to force her to take care of herself.  She reports her appetite is okay.  She reports her sleep is okay on the Ambien.  She continues to have some flashbacks and nightmares about her past history of trauma.  She reports she continues to see [name] for psychotherapy but she has not actually discussed her history of trauma with her.  She reports she will be willing to do so.  She has not been able to keep up with her appointments with [name] also.  She reports she is going to schedule an appointment as soon as possible.  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  She reports that she  continues to struggle with some nightmares on and off.  She however has never thought about discussing that with her therapist yet.She agrees to do so during future sessions.   Visit Diagnosis:    ICD-10-CM   1. MDD (major depressive disorder), recurrent episode, moderate (HCC) F33.1 buPROPion (WELLBUTRIN XL) 150 MG 24 hr tablet    zolpidem (AMBIEN CR) 12.5 MG CR tablet    sertraline (ZOLOFT) 100 MG tablet    ARIPiprazole (ABILIFY) 5 MG tablet  2. Panic disorder F41.0 sertraline (ZOLOFT) 100 MG tablet  3. PTSD (post-traumatic stress disorder) F43.10     Past Psychiatric History: History of depression, anxiety as well as a past history of trauma .  She denies history of suicide attempts.  Denies past history of IP admissions for mental health reasons.  She follows up with [name] for psychotherapy.  Past Medical History:  Past Medical History:  Diagnosis Date  . Anxiety   . Complication of anesthesia    difficulty to get sedated during endoscopy  . Depression   . Headache   . Neuropathy   . Pneumonia    within past five years  . PTSD (post-traumatic stress disorder)   . Seizures ([place])     Past Surgical History:  Procedure Laterality Date  . ABDOMINAL HYSTERECTOMY    .  BILATERAL SALPINGECTOMY Bilateral 09/29/2015   Procedure: BILATERAL SALPINGECTOMY;  Surgeon: Benjaman Kindler, MD;  Location: [facility] ORS;  Service: Gynecology;  Laterality: Bilateral;  . bowel obstruction  2011  . CHOLECYSTECTOMY    . GASTRIC BYPASS    . gi bleed  2009   Surgery-was in ICU for three weeks  . HERNIA REPAIR    . OVARIAN CYST REMOVAL Left 09/29/2015   Procedure: OVARIAN CYSTECTOMY;  Surgeon: Benjaman Kindler, MD;  Location: [facility] ORS;  Service: Gynecology;  Laterality: Left;  [name] VAGINAL HYSTERECTOMY N/A 09/29/2015   Procedure: HYSTERECTOMY VAGINAL;  Surgeon: Benjaman Kindler, MD;  Location: [facility] ORS;  Service: Gynecology;  Laterality: N/A;    Family Psychiatric History:Mother -Anxiety disorder,  sister-depression, anxiety.  Denies history of suicide in her family  Family History:  Family History  Problem Relation Age of Onset  . Arthritis/Rheumatoid Mother   . Heart block Mother   . Clotting disorder Mother   . Osteoporosis Mother   . Heart attack Mother   . Anxiety disorder Mother   . Depression Mother   . Heart disease Father   . Heart attack Father   . Depression Sister   . Anxiety disorder Sister     Substance abuse history: Denies  Social History: She is divorced.  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  She has 2 children, 45 year old son and a daughter who is almost 86 years old.  She lives with her mother and her children and [place]. Social History   Socioeconomic History  . Marital status: Single    Spouse name: None  . Number of children: None  . Years of education: None  . Highest education level: None  Social Needs  . Financial resource strain: None  . Food insecurity - worry: None  . Food insecurity - inability: None  . Transportation needs - medical: None  . Transportation needs - non-medical: None  Occupational History  . None  Tobacco Use  . Smoking status: Never Smoker  . Smokeless tobacco: Never Used  Substance and Sexual Activity  . Alcohol use: No    Alcohol/week: 0.0 oz  . Drug use: No  . Sexual activity: Yes    Birth control/protection: Surgical  Other Topics Concern  . None  Social History Narrative  . None    Allergies:  Allergies  Allergen Reactions  . Amoxicillin Anaphylaxis, Hives, Shortness Of Breath, Swelling and Rash    Has patient had a PCN reaction causing immediate rash, facial/tongue/throat swelling, SOB or lightheadedness with hypotension: Yes Has patient had a PCN reaction causing severe rash involving mucus membranes or skin necrosis: Yes Has patient had a PCN reaction that required hospitalization No Has patient had a PCN reaction occurring within the last 10 years: No If all of the  above answers are "NO", then may proceed with Cephalosporin use. Other reaction(s): ANAPHYLAXIS Other reaction(s): ANAPHYLAX  . Penicillins Anaphylaxis, Swelling and Rash    Has patient had a PCN reaction causing immediate rash, facial/tongue/throat swelling, SOB or lightheadedness with hypotension: Yes Has patient had a PCN reaction causing severe rash involving mucus membranes or skin necrosis: Yes Has patient had a PCN reaction that required hospitalization No Has patient had a PCN reaction occurring within the last 10 years: No If all of the above answers are "NO", then may proceed with Cephalosporin use.   [name] Morphine Other (See Comments)    Muscle spasms  . Sulfa Antibiotics     Other reaction(s): VOMITING  Metabolic Disorder Labs: Lab Results  Component Value Date   HGBA1C 4.8 03/23/2014   No results found for: PROLACTIN No results found for: CHOL, TRIG, HDL, CHOLHDL, VLDL, LDLCALC Lab Results  Component Value Date   TSH  05/13/2009    3.163 (NOTE) Please note change in reference ranges for ages 38W to 20Y. Test methodology is 3rd generation TSH   TSH 1.503 Test methodology is 3rd generation TSH 09/05/2008    Therapeutic Level Labs: No results found for: LITHIUM No results found for: VALPROATE No components found for:  CBMZ  Current Medications: Current Outpatient Medications  Medication Sig Dispense Refill  . acetaminophen (TYLENOL) 325 MG tablet Take 650 mg by mouth every 4 (four) hours as needed for moderate pain.     [name] buPROPion (WELLBUTRIN XL) 150 MG 24 hr tablet Take 2 tablets (300 mg total) by mouth daily. 60 tablet 2  . cyanocobalamin (,VITAMIN B-12,) 1000 MCG/ML injection Inject 1,000 mcg into the muscle every 30 (thirty) days.     . diclofenac sodium (VOLTAREN) 1 % GEL APPLY 4 GMS EVERY 6 HOURS AS NEEDED FOR PAIN    . docusate sodium (COLACE) 100 MG capsule Take 1 capsule (100 mg total) by mouth daily as needed for mild constipation. 60 capsule 3  .  GLUCAGON EMERGENCY 1 MG injection INJECT 1 MG INTO THE MUSCLE ONCE AS NEEDED FOR LOW BLOOD SUGAR.  11  . Multiple Vitamins-Minerals (HM MULTIVITAMIN ADULT GUMMY) CHEW Chew 2 tablets by mouth daily.    [name] oxyCODONE-acetaminophen (PERCOCET) 5-325 MG tablet Take 1-2 tablets by mouth every 6 (six) hours as needed for severe pain. 20 tablet 0  . pantoprazole (PROTONIX) 40 MG tablet TAKE 1 TABLET (40 MG TOTAL) BY MOUTH EVERY 12 (TWELVE) HOURS.  8  . propranolol (INDERAL) 10 MG tablet Take 1 tablet (10 mg total) 3 (three) times daily as needed by mouth. 90 tablet 2  . sertraline (ZOLOFT) 100 MG tablet Take 2 tablets by mouth daily. 60 tablet 2  . sucralfate (CARAFATE) 1 G tablet TAKE 1 TABLET (1 G TOTAL) BY MOUTH 4 (FOUR) TIMES DAILY BEFORE MEALS AND NIGHTLY.  7  . SUMAtriptan (IMITREX) 100 MG tablet Take 100 mg by mouth every 2 (two) hours as needed for migraine or headache.     . thiamine (VITAMIN B-1) 100 MG tablet Take 100 mg by mouth.    . traMADol (ULTRAM) 50 MG tablet TAKE 50-100 MG EVERY 4 HOURS AS NEEDED FOR PAIN NO MORE THAN 8 TABS IN 24 HOURS  0  . zolpidem (AMBIEN CR) 12.5 MG CR tablet Take 1 tablet (12.5 mg total) by mouth at bedtime as needed for sleep. 30 tablet 2  . ARIPiprazole (ABILIFY) 5 MG tablet Take 1 tablet (5 mg total) by mouth at bedtime. 30 tablet 2  . topiramate (TOPAMAX) 25 MG tablet Take 75 mg by mouth 2 (two) times daily. 1 tablet in the am, 2 tablets in the pm     No current facility-administered medications for this visit.      Musculoskeletal: Strength & Muscle Tone: within normal limits Gait & Station: normal Patient leans: N/A  Psychiatric Specialty Exam: Review of Systems  Psychiatric/Behavioral: Positive for depression. The patient is nervous/anxious.   All other systems reviewed and are negative.   Blood pressure 106/70, pulse 70, temperature 98 F (36.7 C), temperature source Oral, weight 145 lb 12.8 oz (66.1 kg).Body mass index is 22.84 kg/m.  General  Appearance: Casual  Eye Contact:  Fair  Speech:  Normal Rate  Volume:  Normal  Mood:  Anxious and Depressed  Affect:  Appropriate  Thought Process:  Goal Directed and Descriptions of Associations: Intact  Orientation:  Full (Time, Place, and Person)  Thought Content: Logical   Suicidal Thoughts:  No  Homicidal Thoughts:  No  Memory:  Immediate;   Fair Recent;   Fair Remote;   Fair  Judgement:  Fair  Insight:  Fair  Psychomotor Activity:  Normal  Concentration:  Concentration: Fair and Attention Span: Fair  Recall:  [employer] of Knowledge: Fair  Language: Fair  Akathisia:  No  Handed:  Right  AIMS (if indicated): NA  Assets:  Communication Skills Desire for Improvement Housing Social Support  ADL's:  Intact  Cognition: WNL  Sleep:  Fair   Screenings:   Assessment and Plan: She is a 42 year old Caucasian female who has a history of depression, panic disorder as well as past history of trauma who is here for a follow-up visit.  Vianney today presented along with her daughter [name] who is 36 years old. Pt continues to struggle with depressive symptoms as well as anxiety issues.  She also has a history of trauma with some nightmares and intrusive memories.  She continues to get psychotherapy from [name] and she is motivated to follow up with her on a more intensive basis.  Will make medication changes as noted below.  Plan For depression Reduce Wellbutrin XL to 300 mg p.o. daily .  Discussed with her about the risk of lowering the seizure threshold on Wellbutrin.  She does have a history of seizures.  She however reports that she is aware of that and this was mentioned to her in the past also.  She however has been on this medication for years and would like to stay on the same at this time.  She is very concerned about weight gain issues and that is one reason she wants to stay on the Wellbutrin. Continue Zoloft 200 mg p.o. daily new Start Abilify 5 mg p.o. nightly  to augment the effect of Wellbutrin as well as Zoloft. (She has a history of complicated gastric bypass, continues to struggle with GI absorption issues which is likely why she needs a higher dose of medications.)  Anxiety symptoms Continue propranolol 10 mg p.o. 3 times daily as needed. Discussed with her that if she notices her blood pressure as low or her heart rate goes down or if she has blurry vision or chest pain , would advise to stop that medication.  She reports she does not use it often and has may be used a couple of times in the past few weeks.  For insomnia Continue Ambien CR 12.5 mg p.o. nightly  Continue psychotherapy with [name].  Follow-up in clinic in 4 weeks or sooner if needed  More than 50 % of the time was spent for psychoeducation and supportive psychotherapy and care coordination.  This note was generated in part or whole with voice recognition software. Voice recognition is usually quite accurate but there are transcription errors that can and very often do occur. I apologize for any typographical errors that were not detected and corrected.       Ursula Alert, MD 02/22/2017, 9:02 AM

## 2017-02-21 NOTE — Patient Instructions (Signed)
Aripiprazole tablets What is this medicine? ARIPIPRAZOLE (ay ri PIP ray zole) is an atypical antipsychotic. It is used to treat schizophrenia and bipolar disorder, also known as manic-depression. It is also used to treat Tourette's disorder and some symptoms of autism. This medicine may also be used in combination with antidepressants to treat major depressive disorder. This medicine may be used for other purposes; ask your health care provider or pharmacist if you have questions. COMMON BRAND NAME(S): Abilify What should I tell my health care provider before I take this medicine? They need to know if you have any of these conditions: -dehydration -dementia -diabetes -heart disease -history of stroke -low blood counts, like low white cell, platelet, or red cell counts -Parkinson's disease -seizures -suicidal thoughts, plans, or attempt; a previous suicide attempt by you or a family member -an unusual or allergic reaction to aripiprazole, other medicines, foods, dyes, or preservatives -pregnant or trying to get pregnant -breast-feeding How should I use this medicine? Take this medicine by mouth with a glass of water. Follow the directions on the prescription label. You can take this medicine with or without food. Take your doses at regular intervals. Do not take your medicine more often than directed. Do not stop taking except on the advice of your doctor or health care professional. A special MedGuide will be given to you by the pharmacist with each prescription and refill. Be sure to read this information carefully each time. Talk to your pediatrician regarding the use of this medicine in children. While this drug may be prescribed for children as young as 6 years of age for selected conditions, precautions do apply. Overdosage: If you think you have taken too much of this medicine contact a poison control center or emergency room at once. NOTE: This medicine is only for you. Do not share  this medicine with others. What if I miss a dose? If you miss a dose, take it as soon as you can. If it is almost time for your next dose, take only that dose. Do not take double or extra doses. What may interact with this medicine? Do not take this medicine with any of the following medications: -brexpiprazole -cisapride -dofetilide -dronedarone -metoclopramide -pimozide -thioridazine This medicine may also interact with the following medications: -alcohol -carbamazepine -certain medicines for anxiety or sleep -certain medicines for blood pressure -certain medicines for fungal infections like ketoconazole, fluconazole, posaconazole, and itraconazole -clarithromycin -fluoxetine -other medicines that prolong the QT interval (cause an abnormal heart rhythm) -paroxetine -quinidine -rifampin This list may not describe all possible interactions. Give your health care provider a list of all the medicines, herbs, non-prescription drugs, or dietary supplements you use. Also tell them if you smoke, drink alcohol, or use illegal drugs. Some items may interact with your medicine. What should I watch for while using this medicine? Visit your doctor or health care professional for regular checks on your progress. It may be several weeks before you see the full effects of this medicine. Do not suddenly stop taking this medicine. You may need to gradually reduce the dose. Patients and their families should watch out for worsening depression or thoughts of suicide. Also watch out for sudden changes in feelings such as feeling anxious, agitated, panicky, irritable, hostile, aggressive, impulsive, severely restless, overly excited and hyperactive, or not being able to sleep. If this happens, especially at the beginning of antidepressant treatment or after a change in dose, call your health care professional. You may get dizzy or drowsy. Do   not drive, use machinery, or do anything that needs mental  alertness until you know how this medicine affects you. Do not stand or sit up quickly, especially if you are an older patient. This reduces the risk of dizzy or fainting spells. Alcohol can increase dizziness and drowsiness. Avoid alcoholic drinks. This medicine can reduce the response of your body to heat or cold. Dress warm in cold weather and stay hydrated in hot weather. If possible, avoid extreme temperatures like saunas, hot tubs, very hot or cold showers, or activities that can cause dehydration such as vigorous exercise. This medicine may cause dry eyes and blurred vision. If you wear contact lenses you may feel some discomfort. Lubricating drops may help. See your eye doctor if the problem does not go away or is severe. If you notice an increased hunger or thirst, different from your normal hunger or thirst, or if you find that you have to urinate more frequently, you should contact your health care provider as soon as possible. You may need to have your blood sugar monitored. This medicine may cause changes in your blood sugar levels. You should monitor you blood sugar frequently if you have diabetes. There have been reports of uncontrollable and strong urges to gamble, binge eat, shop, and have sex while taking this medicine. If you experience any of these or other uncontrollable and strong urges while taking this medicine, you should report it to your health care provider as soon as possible. What side effects may I notice from receiving this medicine? Side effects that you should report to your doctor or health care professional as soon as possible: -allergic reactions like skin rash, itching or hives, swelling of the face, lips, or tongue -breathing problems -confusion -feeling faint or lightheaded, falls -fever or chills, sore throat -increased hunger or thirst -increased urination -joint pain -muscles pain, spasms -problems with balance, talking, walking -restlessness or need to  keep moving -seizures -suicidal thoughts or other mood changes -trouble swallowing -uncontrollable and excessive urges (examples: gambling, binge eating, shopping, having sex) -uncontrollable head, mouth, neck, arm, or leg movements -unusually weak or tired Side effects that usually do not require medical attention (report to your doctor or health care professional if they continue or are bothersome): -blurred vision -constipation -headache -nausea, vomiting -trouble sleeping -weight gain This list may not describe all possible side effects. Call your doctor for medical advice about side effects. You may report side effects to FDA at 1-800-FDA-1088. Where should I keep my medicine? Keep out of the reach of children. Store at room temperature between 15 and 30 degrees C (59 and 86 degrees F). Throw away any unused medicine after the expiration date. NOTE: This sheet is a summary. It may not cover all possible information. If you have questions about this medicine, talk to your doctor, pharmacist, or health care provider.  2018 Elsevier/Gold Standard (2016-01-18 11:45:05)  

## 2017-02-22 ENCOUNTER — Encounter: Payer: Self-pay | Admitting: Psychiatry

## 2017-02-23 ENCOUNTER — Ambulatory Visit: Payer: Self-pay | Admitting: Psychiatry

## 2017-03-21 ENCOUNTER — Encounter: Payer: Self-pay | Admitting: Psychiatry

## 2017-03-21 ENCOUNTER — Ambulatory Visit (INDEPENDENT_AMBULATORY_CARE_PROVIDER_SITE_OTHER): Payer: Medicare Other | Admitting: Psychiatry

## 2017-03-21 ENCOUNTER — Other Ambulatory Visit: Payer: Self-pay

## 2017-03-21 VITALS — BP 119/77 | HR 93 | Temp 98.3°F | Wt 143.4 lb

## 2017-03-21 DIAGNOSIS — F331 Major depressive disorder, recurrent, moderate: Secondary | ICD-10-CM | POA: Diagnosis not present

## 2017-03-21 DIAGNOSIS — F4310 Post-traumatic stress disorder, unspecified: Secondary | ICD-10-CM

## 2017-03-21 DIAGNOSIS — F410 Panic disorder [episodic paroxysmal anxiety] without agoraphobia: Secondary | ICD-10-CM

## 2017-03-21 MED ORDER — ARIPIPRAZOLE 10 MG PO TABS: 5.0000 mg | ORAL_TABLET | Freq: Two times a day (BID) | ORAL | 1 refills | Status: DC

## 2017-03-21 NOTE — Progress Notes (Signed)
[facility] MD OP Progress Note  03/21/2017 3:40 PM [name]  MRN:  [id number]  Chief Complaint: ' I am still anxious , its my daughter having some issues.'  Chief Complaint    Follow-up; Medication Refill     HPI: [name] is a 42 year old Caucasian female, divorced, on SSD, lives in [place], has a history of depression, anxiety, presented to the clinic today for a follow-up visit.  [name] presented along with her daughter who is almost 5 years old-[name].  [name] reports her depressive symptoms may have gotten better.  She reports she continues to struggle with anxiety issues.  She reports she started taking the Abilify 5 mg and tolerated it well.  She however reports that it may not be helping a lot since she has not noticed much change.  She reports she does have a history of gastric bypass and hence may need her medications to be at a  high dose due to possible trouble with absorption.  She reports she usually tolerates high doses well and needs that to get the same effect.  She reports she would like her Abilify to be increased if possible this time.  She also reports a lot has been going on.  She reports her daughter who is almost 75 years old is currently struggling with constipation.  She reports sometimes it can be so hard watching her go through that.  She has been passing out stone hard bowel movements and at times has been noted as bleeding doing so.  She reports she sometimes wakes her up in the middle of the night because of being in pain and wanting to pass her bowel.  Reports she had to go to the emergency department a few weeks ago.  She reports she was given rectal suppositories as well as other medications.  They also advised her to stop taking any diary for a few weeks.  She reports she feels like her doctors are not understanding what she is going through because  they have been telling her it is behavioral.  She reports she finds it hard to accept that its behavioral  .  She reports she understands that to deal with her anxiety issues she needs to follow-up with Ms. Miguel Dibble for therapy.  She however reports that she has not been finding enough time to do so.  She also reports that her mother is also dealing with some health issues including rheumatoid arthritis and is in pain most of the time.  She reports that because of that her mother is unable to care for her 60-year-old when she has to go for these appointments.  She reports that makes it even more difficult because she knows that for her therapy appointment she cannot take her child along with her.  She however reports that she will try to do what she can to restart going for her psychotherapy appointments again.  Visit Diagnosis:    ICD-10-CM   1. MDD (major depressive disorder), recurrent episode, moderate (HCC) F33.1 ARIPiprazole (ABILIFY) 10 MG tablet  2. Panic disorder F41.0   3. PTSD (post-traumatic stress disorder) F43.10     Past Psychiatric History: History of depression, anxiety as well as history of trauma.  She denies history of suicide attempts.  Denies history of IP admissions for mental health reasons.  She follows up with Ms. Miguel Dibble for psychotherapy.  Past Medical History:  Past Medical History:  Diagnosis Date  . Anxiety   . Complication of anesthesia  difficulty to get sedated during endoscopy  . Depression   . Headache   . Neuropathy   . Pneumonia    within past five years  . PTSD (post-traumatic stress disorder)   . Seizures ([place])     Past Surgical History:  Procedure Laterality Date  . ABDOMINAL HYSTERECTOMY    . BILATERAL SALPINGECTOMY Bilateral 09/29/2015   Procedure: BILATERAL SALPINGECTOMY;  Surgeon: Benjaman Kindler, MD;  Location: [facility] ORS;  Service: Gynecology;  Laterality: Bilateral;  . bowel obstruction  2011  . CHOLECYSTECTOMY    . GASTRIC BYPASS    . gi bleed  2009   Surgery-was in ICU for three weeks  . HERNIA REPAIR    . OVARIAN CYST  REMOVAL Left 09/29/2015   Procedure: OVARIAN CYSTECTOMY;  Surgeon: Benjaman Kindler, MD;  Location: [facility] ORS;  Service: Gynecology;  Laterality: Left;  [name] VAGINAL HYSTERECTOMY N/A 09/29/2015   Procedure: HYSTERECTOMY VAGINAL;  Surgeon: Benjaman Kindler, MD;  Location: [facility] ORS;  Service: Gynecology;  Laterality: N/A;    Family Psychiatric History: Mother-anxiety disorder, sister-depression, anxiety.  Denies history of suicide in her family.  Family History:  Family History  Problem Relation Age of Onset  . Arthritis/Rheumatoid Mother   . Heart block Mother   . Clotting disorder Mother   . Osteoporosis Mother   . Heart attack Mother   . Anxiety disorder Mother   . Depression Mother   . Heart disease Father   . Heart attack Father   . Depression Sister   . Anxiety disorder Sister    Substance abuse history: Denies  Social History: She is divorced.  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  She has 2 children, 85 year old son and a daughter who is almost 58 years old.  She lives with her mother and her children in [place]. Social History   Socioeconomic History  . Marital status: Single    Spouse name: None  . Number of children: None  . Years of education: None  . Highest education level: None  Social Needs  . Financial resource strain: None  . Food insecurity - worry: None  . Food insecurity - inability: None  . Transportation needs - medical: None  . Transportation needs - non-medical: None  Occupational History  . None  Tobacco Use  . Smoking status: Never Smoker  . Smokeless tobacco: Never Used  Substance and Sexual Activity  . Alcohol use: No    Alcohol/week: 0.0 oz  . Drug use: No  . Sexual activity: Yes    Birth control/protection: Surgical  Other Topics Concern  . None  Social History Narrative  . None    Allergies:  Allergies  Allergen Reactions  . Amoxicillin Anaphylaxis, Hives, Shortness Of Breath, Swelling and Rash    Has  patient had a PCN reaction causing immediate rash, facial/tongue/throat swelling, SOB or lightheadedness with hypotension: Yes Has patient had a PCN reaction causing severe rash involving mucus membranes or skin necrosis: Yes Has patient had a PCN reaction that required hospitalization No Has patient had a PCN reaction occurring within the last 10 years: No If all of the above answers are "NO", then may proceed with Cephalosporin use. Other reaction(s): ANAPHYLAXIS Other reaction(s): ANAPHYLAX  . Penicillins Anaphylaxis, Swelling and Rash    Has patient had a PCN reaction causing immediate rash, facial/tongue/throat swelling, SOB or lightheadedness with hypotension: Yes Has patient had a PCN reaction causing severe rash involving mucus membranes or skin necrosis: Yes Has patient had  a PCN reaction that required hospitalization No Has patient had a PCN reaction occurring within the last 10 years: No If all of the above answers are "NO", then may proceed with Cephalosporin use.   [name] Morphine Other (See Comments)    Muscle spasms  . Sulfa Antibiotics     Other reaction(s): VOMITING    Metabolic Disorder Labs: Lab Results  Component Value Date   HGBA1C 4.8 03/23/2014   No results found for: PROLACTIN No results found for: CHOL, TRIG, HDL, CHOLHDL, VLDL, LDLCALC Lab Results  Component Value Date   TSH  05/13/2009    3.163 (NOTE)  Please note change in reference ranges for ages 27W to 15Y. Test methodology is 3rd generation TSH   TSH 1.503 Test methodology is 3rd generation TSH 09/05/2008    Therapeutic Level Labs: No results found for: LITHIUM No results found for: VALPROATE No components found for:  CBMZ  Current Medications: Current Outpatient Medications  Medication Sig Dispense Refill  . acetaminophen (TYLENOL) 325 MG tablet Take 650 mg by mouth every 4 (four) hours as needed for moderate pain.     [name] buPROPion (WELLBUTRIN XL) 150 MG 24 hr tablet Take 2 tablets (300 mg total)  by mouth daily. 60 tablet 2  . cyanocobalamin (,VITAMIN B-12,) 1000 MCG/ML injection Inject 1,000 mcg into the muscle every 30 (thirty) days.     . diclofenac sodium (VOLTAREN) 1 % GEL APPLY 4 GMS EVERY 6 HOURS AS NEEDED FOR PAIN    . docusate sodium (COLACE) 100 MG capsule Take 1 capsule (100 mg total) by mouth daily as needed for mild constipation. 60 capsule 3  . GLUCAGON EMERGENCY 1 MG injection INJECT 1 MG INTO THE MUSCLE ONCE AS NEEDED FOR LOW BLOOD SUGAR.  11  . Multiple Vitamins-Minerals (HM MULTIVITAMIN ADULT GUMMY) CHEW Chew 2 tablets by mouth daily.    [name] oxyCODONE-acetaminophen (PERCOCET) 5-325 MG tablet Take 1-2 tablets by mouth every 6 (six) hours as needed for severe pain. 20 tablet 0  . pantoprazole (PROTONIX) 40 MG tablet TAKE 1 TABLET (40 MG TOTAL) BY MOUTH EVERY 12 (TWELVE) HOURS.  8  . propranolol (INDERAL) 10 MG tablet Take 1 tablet (10 mg total) 3 (three) times daily as needed by mouth. 90 tablet 2  . sertraline (ZOLOFT) 100 MG tablet Take 2 tablets by mouth daily. 60 tablet 2  . sucralfate (CARAFATE) 1 G tablet TAKE 1 TABLET (1 G TOTAL) BY MOUTH 4 (FOUR) TIMES DAILY BEFORE MEALS AND NIGHTLY.  7  . SUMAtriptan (IMITREX) 100 MG tablet Take 100 mg by mouth every 2 (two) hours as needed for migraine or headache.     . thiamine (VITAMIN B-1) 100 MG tablet Take 100 mg by mouth.    . traMADol (ULTRAM) 50 MG tablet TAKE 50-100 MG EVERY 4 HOURS AS NEEDED FOR PAIN NO MORE THAN 8 TABS IN 24 HOURS  0  . zolpidem (AMBIEN CR) 12.5 MG CR tablet Take 1 tablet (12.5 mg total) by mouth at bedtime as needed for sleep. 30 tablet 2  . ARIPiprazole (ABILIFY) 10 MG tablet Take 0.5 tablets (5 mg total) by mouth 2 (two) times daily. 30 tablet 1  . topiramate (TOPAMAX) 25 MG tablet Take 75 mg by mouth 2 (two) times daily. 1 tablet in the am, 2 tablets in the pm     No current facility-administered medications for this visit.      Musculoskeletal: Strength & Muscle Tone: within normal limits Gait  & Station:  normal Patient leans: N/A  Psychiatric Specialty Exam: Review of Systems  Psychiatric/Behavioral: The patient is nervous/anxious.   All other systems reviewed and are negative.   Blood pressure 119/77, pulse 93, temperature 98.3 F (36.8 C), temperature source Oral, weight 143 lb 6.4 oz (65 kg).Body mass index is 22.46 kg/m.  General Appearance: Casual  Eye Contact:  Fair  Speech:  Clear and Coherent  Volume:  Normal  Mood:  Anxious  Affect:  Congruent  Thought Process:  Goal Directed and Descriptions of Associations: Intact  Orientation:  Full (Time, Place, and Person)  Thought Content: Logical   Suicidal Thoughts:  No  Homicidal Thoughts:  No  Memory:  Immediate;   Fair Recent;   Fair Remote;   Fair  Judgement:  Fair  Insight:  Fair  Psychomotor Activity:  Normal  Concentration:  Concentration: Fair and Attention Span: Fair  Recall:  [employer] of Knowledge: Fair  Language: Fair  Akathisia:  No  Handed:  Right  AIMS (if indicated): denies tremors, rigidity, stiffness  Assets:  Communication Skills Desire for Improvement Financial Resources/Insurance Housing Social Support  ADL's:  Intact  Cognition: WNL  Sleep:  Fair   Screenings:AIMS    Assessment and Plan: Ameya is a 42 year old Caucasian female who has a history of depression, panic disorder as well as past history of trauma who is here for a follow-up visit.  [name] today presented along with her daughter [name] who is 56 years old.  The patient continues to struggle with some anxiety issues more so due to her daughter's health issues as well as her mother going through some health problems.  She reports she has not been able to restart psychotherapy with Ms. Miguel Dibble since she has not been able to find childcare for her daughter and her mother who is currently having her on health issues  is unable to care for her.  Discussed medication changes.  Plan as noted below.  Plan For  depression Continue reduced dose of Wellbutrin XL at 300 mg p.o. daily Discussed with her about the risk of lowering the seizure threshold on Wellbutrin.  She does have a history of seizures.  She reports that she is aware of that and that this was mentioned to her in the past by her other providers.  She however has been on this medication for years and would like to stay on the same at this time. Continue Zoloft 200 mg p.o. daily Increase Abilify to 5 mg p.o. twice daily to augment the effect of her antidepressants.  Anxiety symptoms Continue propranolol 10 mg p.o. 3 times daily as needed Continue psychotherapy with Ms. Miguel Dibble.  For insomnia Continue Ambien CR 12.5 mg p.o. Nightly.  Discussed with patient to restart psychotherapy with Ms. Miguel Dibble given her recent stressors.  She agrees with plan.  Follow-up in 1 month or sooner if needed.  More than 50 % of the time was spent for psychoeducation and supportive psychotherapy and care coordination.  This note was generated in part or whole with voice recognition software. Voice recognition is usually quite accurate but there are transcription errors that can and very often do occur. I apologize for any typographical errors that were not detected and corrected.         Ursula Alert, MD 03/21/2017, 3:40 PM

## 2017-03-24 ENCOUNTER — Telehealth: Payer: Self-pay

## 2017-03-24 NOTE — Telephone Encounter (Signed)
pt called stats that her zolpidem was denied and that she needed to get a prior auth and that pharmacy was send pa over.

## 2017-03-24 NOTE — Telephone Encounter (Signed)
called pt left message that medication was at 1st denied but that we did an appeal and it was approved this morning and that i have already faxed and confirmed the approval notice to the pharmacy

## 2017-03-24 NOTE — Telephone Encounter (Signed)
pharmacy was faxed and confirmed approval notice for the zolpidem tartrate

## 2017-03-24 NOTE — Telephone Encounter (Signed)
faxed over pa pending review

## 2017-03-24 NOTE — Telephone Encounter (Signed)
denied pa for zolpidem tart.  will start on appeal

## 2017-03-24 NOTE — Telephone Encounter (Signed)
received noticed that appeal was approved from 03-23-17 to  02-14-2018.

## 2017-04-18 ENCOUNTER — Ambulatory Visit: Payer: Medicare Other | Admitting: Psychiatry

## 2017-04-25 ENCOUNTER — Ambulatory Visit: Payer: Medicare Other | Admitting: Psychiatry

## 2017-05-09 ENCOUNTER — Other Ambulatory Visit: Payer: Self-pay

## 2017-05-09 ENCOUNTER — Ambulatory Visit (INDEPENDENT_AMBULATORY_CARE_PROVIDER_SITE_OTHER): Payer: Medicare Other | Admitting: Psychiatry

## 2017-05-09 ENCOUNTER — Encounter: Payer: Self-pay | Admitting: Psychiatry

## 2017-05-09 VITALS — BP 114/75 | HR 73 | Temp 98.3°F | Wt 149.6 lb

## 2017-05-09 DIAGNOSIS — F410 Panic disorder [episodic paroxysmal anxiety] without agoraphobia: Secondary | ICD-10-CM

## 2017-05-09 DIAGNOSIS — F4310 Post-traumatic stress disorder, unspecified: Secondary | ICD-10-CM | POA: Diagnosis not present

## 2017-05-09 DIAGNOSIS — F331 Major depressive disorder, recurrent, moderate: Secondary | ICD-10-CM

## 2017-05-09 MED ORDER — SERTRALINE HCL 100 MG PO TABS: ORAL_TABLET | ORAL | 2 refills | Status: DC

## 2017-05-09 MED ORDER — BENZTROPINE MESYLATE 0.5 MG PO TABS: 0.5000 mg | ORAL_TABLET | Freq: Every day | ORAL | 2 refills | Status: DC

## 2017-05-09 MED ORDER — ARIPIPRAZOLE 10 MG PO TABS: 5.0000 mg | ORAL_TABLET | Freq: Two times a day (BID) | ORAL | 2 refills | Status: DC

## 2017-05-09 MED ORDER — BUPROPION HCL ER (XL) 150 MG PO TB24: 300.0000 mg | ORAL_TABLET | Freq: Every day | ORAL | 2 refills | Status: DC

## 2017-05-09 MED ORDER — ZOLPIDEM TARTRATE ER 12.5 MG PO TBCR: 12.5000 mg | EXTENDED_RELEASE_TABLET | Freq: Every evening | ORAL | 2 refills | Status: DC | PRN

## 2017-05-09 NOTE — Progress Notes (Signed)
[place] MD  OP Progress Note  05/09/2017 5:16 PM [name]  MRN:  [id number]  Chief Complaint: ' I am ok." Chief Complaint    Follow-up; Medication Refill     HPI: [name] is a 42 year old Caucasian female, divorced, on SSD, lives in [place], has a history of depression, anxiety, presented to the clinic today for a follow-up visit.  [name] today reports that her depression is currently stable on the current medication regimen.  She continues to struggle with some anxiety issues.  She reports that could also be due to a lot of psychosocial stressors at this time.  Her daughter who is 41 years old continues to struggle with constipation.  She has been trying behavioral techniques as well as medications as prescribed by her daughter's providers.  Patient reports it continues to be a challenge for her to cope with it.  Her daughter's father who is patient's ex-husband was recently diagnosed with neurological problems.  She reports they are trying to rule out MS versus lymphoma or stroke.  Patient reports that she wants to support her daughter's father.  She reports not knowing what is going to happen and how to move forward has been making her more anxious.  She reports she has reached out to Ms. Miguel Dibble for restarting psychotherapy.  She reports she is waiting for a phone call from her.  She reports she would like to follow-up with Otila Kluver on a regular basis.  She otherwise denies any other symptoms at this time.  She reports she would like to continue the same medication since she is tolerating them well.  She denies any suicidality or perceptual disturbances at this time.   Visit Diagnosis:    ICD-10-CM   1. MDD (major depressive disorder), recurrent episode, moderate (HCC) F33.1 zolpidem (AMBIEN CR) 12.5 MG CR tablet    sertraline (ZOLOFT) 100 MG tablet    buPROPion (WELLBUTRIN XL) 150 MG 24 hr tablet    ARIPiprazole (ABILIFY) 10 MG tablet    benztropine (COGENTIN) 0.5 MG  tablet  2. Panic disorder F41.0 sertraline (ZOLOFT) 100 MG tablet  3. PTSD (post-traumatic stress disorder) F43.10     Past Psychiatric History: History of depression, anxiety as well as history of trauma.  She denies history of suicide attempts.  Denies history of IP admission for mental health reasons.  She used to follow up with Ms. Miguel Dibble for psychotherapy  Past Medical History:  Past Medical History:  Diagnosis Date  . Anxiety   . Complication of anesthesia    difficulty to get sedated during endoscopy  . Depression   . Headache   . Neuropathy   . Pneumonia    within past five years  . PTSD (post-traumatic stress disorder)   . Seizures ([place])     Past Surgical History:  Procedure Laterality Date  . ABDOMINAL HYSTERECTOMY    . BILATERAL SALPINGECTOMY Bilateral 09/29/2015   Procedure: BILATERAL SALPINGECTOMY;  Surgeon: Benjaman Kindler, MD;  Location: [facility] ORS;  Service: Gynecology;  Laterality: Bilateral;  . bowel obstruction  2011  . CHOLECYSTECTOMY    . GASTRIC BYPASS    . gi bleed  2009   Surgery-was in ICU for three weeks  . HERNIA REPAIR    . OVARIAN CYST REMOVAL Left 09/29/2015   Procedure: OVARIAN CYSTECTOMY;  Surgeon: Benjaman Kindler, MD;  Location: [facility] ORS;  Service: Gynecology;  Laterality: Left;  [name] VAGINAL HYSTERECTOMY N/A 09/29/2015   Procedure: HYSTERECTOMY VAGINAL;  Surgeon: Benjaman Kindler, MD;  Location: [facility] ORS;  Service: Gynecology;  Laterality: N/A;    Family Psychiatric History: Mother-anxiety disorder, sister-depression, anxiety.  Denies history of suicide in her family.  Family History:  Family History  Problem Relation Age of Onset  . Arthritis/Rheumatoid Mother   . Heart block Mother   . Clotting disorder Mother   . Osteoporosis Mother   . Heart attack Mother   . Anxiety disorder Mother   . Depression Mother   . Heart disease Father   . Heart attack Father   . Depression Sister   . Anxiety disorder Sister    Substance abuse  history: Denies  Social History: Divorced.  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  She has 2 children, 21 year old son and a daughter who is  19 years old.  She lives with her mother and her children in [place]. Social History   Socioeconomic History  . Marital status: Single    Spouse name: Not on file  . Number of children: Not on file  . Years of education: Not on file  . Highest education level: Not on file  Occupational History  . Not on file  Social Needs  . Financial resource strain: Not on file  . Food insecurity:    Worry: Not on file    Inability: Not on file  . Transportation needs:    Medical: Not on file    Non-medical: Not on file  Tobacco Use  . Smoking status: Never Smoker  . Smokeless tobacco: Never Used  Substance and Sexual Activity  . Alcohol use: No    Alcohol/week: 0.0 oz  . Drug use: No  . Sexual activity: Yes    Birth control/protection: Surgical  Lifestyle  . Physical activity:    Days per week: Not on file    Minutes per session: Not on file  . Stress: Not on file  Relationships  . Social connections:    Talks on phone: Not on file    Gets together: Not on file    Attends religious service: Not on file    Active member of club or organization: Not on file    Attends meetings of clubs or organizations: Not on file    Relationship status: Not on file  Other Topics Concern  . Not on file  Social History Narrative  . Not on file    Allergies:  Allergies  Allergen Reactions  . Amoxicillin Anaphylaxis, Hives, Shortness Of Breath, Swelling and Rash    Has patient had a PCN reaction causing immediate rash, facial/tongue/throat swelling, SOB or lightheadedness with hypotension: Yes Has patient had a PCN reaction causing severe rash involving mucus membranes or skin necrosis: Yes Has patient had a PCN reaction that required hospitalization No Has patient had a PCN reaction occurring within the last 10 years: No If all  of the above answers are "NO", then may proceed with Cephalosporin use. Other reaction(s): ANAPHYLAXIS Other reaction(s): ANAPHYLAX  . Penicillins Anaphylaxis, Swelling and Rash    Has patient had a PCN reaction causing immediate rash, facial/tongue/throat swelling, SOB or lightheadedness with hypotension: Yes Has patient had a PCN reaction causing severe rash involving mucus membranes or skin necrosis: Yes Has patient had a PCN reaction that required hospitalization No Has patient had a PCN reaction occurring within the last 10 years: No If all of the above answers are "NO", then may proceed with Cephalosporin use.   [name] Morphine Other (See Comments)    Muscle spasms  .  Sulfa Antibiotics     Other reaction(s): VOMITING    Metabolic Disorder Labs: Lab Results  Component Value Date   HGBA1C 4.8 03/23/2014   No results found for: PROLACTIN No results found for: CHOL, TRIG, HDL, CHOLHDL, VLDL, LDLCALC Lab Results  Component Value Date   TSH  05/13/2009    3.163 (NOTE) Please note change in reference ranges for ages 68W to 82Y. Test methodology is 3rd generation TSH   TSH 1.503 Test methodology is 3rd generation TSH 09/05/2008    Therapeutic Level Labs: No results found for: LITHIUM No results found for: VALPROATE No components found for:  CBMZ  Current Medications: Current Outpatient Medications  Medication Sig Dispense Refill  . acetaminophen (TYLENOL) 325 MG tablet Take 650 mg by mouth every 4 (four) hours as needed for moderate pain.     . ARIPiprazole (ABILIFY) 10 MG tablet Take 0.5 tablets (5 mg total) by mouth 2 (two) times daily. 30 tablet 2  . buPROPion (WELLBUTRIN XL) 150 MG 24 hr tablet Take 2 tablets (300 mg total) by mouth daily. 60 tablet 2  . cyanocobalamin (,VITAMIN B-12,) 1000 MCG/ML injection Inject 1,000 mcg into the muscle every 30 (thirty) days.     . diclofenac sodium (VOLTAREN) 1 % GEL APPLY 4 GMS EVERY 6 HOURS AS NEEDED FOR PAIN    . docusate sodium  (COLACE) 100 MG capsule Take 1 capsule (100 mg total) by mouth daily as needed for mild constipation. 60 capsule 3  . GLUCAGON EMERGENCY 1 MG injection INJECT 1 MG INTO THE MUSCLE ONCE AS NEEDED FOR LOW BLOOD SUGAR.  11  . Multiple Vitamins-Minerals (HM MULTIVITAMIN ADULT GUMMY) CHEW Chew 2 tablets by mouth daily.    [name] oxyCODONE-acetaminophen (PERCOCET) 5-325 MG tablet Take 1-2 tablets by mouth every 6 (six) hours as needed for severe pain. 20 tablet 0  . pantoprazole (PROTONIX) 40 MG tablet TAKE 1 TABLET (40 MG TOTAL) BY MOUTH EVERY 12 (TWELVE) HOURS.  8  . propranolol (INDERAL) 10 MG tablet Take 1 tablet (10 mg total) 3 (three) times daily as needed by mouth. 90 tablet 2  . sertraline (ZOLOFT) 100 MG tablet Take 2 tablets by mouth daily. 60 tablet 2  . sucralfate (CARAFATE) 1 G tablet TAKE 1 TABLET (1 G TOTAL) BY MOUTH 4 (FOUR) TIMES DAILY BEFORE MEALS AND NIGHTLY.  7  . SUMAtriptan (IMITREX) 100 MG tablet Take 100 mg by mouth every 2 (two) hours as needed for migraine or headache.     . thiamine (VITAMIN B-1) 100 MG tablet Take 100 mg by mouth.    . traMADol (ULTRAM) 50 MG tablet TAKE 50-100 MG EVERY 4 HOURS AS NEEDED FOR PAIN NO MORE THAN 8 TABS IN 24 HOURS  0  . zolpidem (AMBIEN CR) 12.5 MG CR tablet Take 1 tablet (12.5 mg total) by mouth at bedtime as needed for sleep. 30 tablet 2  . benztropine (COGENTIN) 0.5 MG tablet Take 1 tablet (0.5 mg total) by mouth daily. 30 tablet 2  . topiramate (TOPAMAX) 25 MG tablet Take 75 mg by mouth 2 (two) times daily. 1 tablet in the am, 2 tablets in the pm     No current facility-administered medications for this visit.      Musculoskeletal: Strength & Muscle Tone: within normal limits Gait & Station: normal Patient leans: N/A  Psychiatric Specialty Exam: Review of Systems  Psychiatric/Behavioral: The patient is nervous/anxious.   All other systems reviewed and are negative.   Blood pressure 114/75,  pulse 73, temperature 98.3 F (36.8 C),  temperature source Oral, weight 149 lb 9.6 oz (67.9 kg).Body mass index is 23.43 kg/m.  General Appearance: Casual  Eye Contact:  Fair  Speech:  Clear and Coherent  Volume:  Normal  Mood:  Anxious  Affect:  Appropriate  Thought Process:  Goal Directed and Descriptions of Associations: Intact  Orientation:  Full (Time, Place, and Person)  Thought Content: Logical   Suicidal Thoughts:  No  Homicidal Thoughts:  No  Memory:  Immediate;   Fair Recent;   Fair Remote;   Fair  Judgement:  Fair  Insight:  Fair  Psychomotor Activity:  Normal  Concentration:  Concentration: Fair and Attention Span: Fair  Recall:  [employer] of Knowledge: Fair  Language: Fair  Akathisia:  No  Handed:  Right  AIMS (if indicated): does report some stiffness - AIMS - 0  Assets:  Communication Skills Desire for Improvement Housing Social Support  ADL's:  Intact  Cognition: WNL  Sleep:  Fair   Screenings:   Assessment and Plan: [name] is a 42 year old Caucasian female who has a history of depression, panic disorder as well as past history of trauma who is here for a follow-up visit.  Patient continues to struggle with several psychosocial stressors including her daughter's health issues, mother going through some health problems as well as her ex-husband who was recently diagnosed with neurological problems.  Patient however is looking forward to restarting psychotherapy with Ms. Miguel Dibble soon.  Patient denies any side effects except for some possible stiffness on the Abilify at this time.  She however reports she has had stiffness in the past and is not too sure if it is due to the Abilify or not.  Pt reports stiffness  on her  joints.  Discussed adding Cogentin.  She agrees with plan.  Plan as noted below.  Plan For depression Continue the reduce dose of Wellbutrin XL at 300 mg p.o. daily Discussed with her about the risk of lowering the seizure threshold on Wellbutrin.  She does have a history of  seizures.  She reports she is aware of that and that this was mentioned to her in the past by her other providers.  She  however has been on this medication for years and would like to stay on the same at this time. Continue Zoloft 200 mg daily Continue Abilify 5 mg p.o. twice daily Aims equals 0 However she has been complaining of some's joint stiffness and hence we will start Cogentin 0.5 mg p.o. daily  For anxiety symptoms Continue propranolol 10 mg p.o. 3 times daily as needed Patient has already reached out to Ms. Miguel Dibble to restart psychotherapy  For insomnia Continue Ambien CR 12.5 mg p.o. Nightly  Follow-up in clinic in 1 month or sooner if needed.  More than 50 % of the time was spent for psychoeducation and supportive psychotherapy and care coordination.  This note was generated in part or whole with voice recognition software. Voice recognition is usually quite accurate but there are transcription errors that can and very often do occur. I apologize for any typographical errors that were not detected and corrected.       Ursula Alert, MD 05/09/2017, 5:16 PM

## 2017-05-09 NOTE — Patient Instructions (Signed)
Benztropine tablets What is this medicine? BENZTROPINE (BENZ troe peen) is for certain movement problems due to Parkinson's disease, certain medicines, or other causes. This medicine may be used for other purposes; ask your health care provider or pharmacist if you have questions. COMMON BRAND NAME(S): Cogentin What should I tell my health care provider before I take this medicine? They need to know if you have any of these conditions: -glaucoma -heart disease or a rapid heartbeat -mental problems -prostate trouble -tardive dyskinesia -an unusual or allergic reaction to benztropine, other medicines, lactose, foods, dyes, or preservatives -pregnant or trying to get pregnant -breast-feeding How should I use this medicine? Take this medicine by mouth with a full glass of water. Follow the directions on the prescription label. Take your medicine at regular intervals. Do not take your medicine more often than directed. Talk to your pediatrician regarding the use of this medicine in children. While this drug may be prescribed for children as young as 3 years for selected conditions, precautions do apply. Overdosage: If you think you have taken too much of this medicine contact a poison control center or emergency room at once. NOTE: This medicine is only for you. Do not share this medicine with others. What if I miss a dose? If you miss a dose, take it as soon as you can. If it is almost time for your next dose, take only that dose. Do not take double or extra doses. What may interact with this medicine? -haloperidol -medicines for movement abnormalities like Parkinson's disease -phenothiazines like chlorpromazine, mesoridazine, prochlorperazine, thioridazine -some antidepressants like amitriptyline, desipramine, doxepin, nortriptyline -stimulant medicines for attention, weight loss, and to stay awake -tegaserod This list may not describe all possible interactions. Give your health care  provider a list of all the medicines, herbs, non-prescription drugs, or dietary supplements you use. Also tell them if you smoke, drink alcohol, or use illegal drugs. Some items may interact with your medicine. What should I watch for while using this medicine? Visit your doctor or health care professional for regular checks on your progress. You may get drowsy or dizzy. Do not drive, use machinery, or do anything that needs mental alertness until you know how this medicine affects you. Do not stand or sit up quickly, especially if you are an older patient. This reduces the risk of dizzy or fainting spells. Alcohol may interfere with the effect of this medicine. Avoid alcoholic drinks. Your mouth may get dry. Chewing sugarless gum or sucking hard candy, and drinking plenty of water may help. Contact your doctor if the problem does not go away or is severe. This medicine may cause dry eyes and blurred vision. If you wear contact lenses you may feel some discomfort. Lubricating drops may help. See your eye doctor if the problem does not go away or is severe. You may sweat less than usual while you are taking this medicine. As a result your body temperature could rise to a dangerous level. Be careful not to get overheated during exercise or in hot weather. You could get heat stroke. Avoid taking hot baths and using hot tubs and saunas. What side effects may I notice from receiving this medicine? Side effects that you should report to your doctor or health care professional as soon as possible: -allergic reactions like skin rash, itching or hives, swelling of the face, lips, or tongue -changes in vision -confusion -decreased sweating or heat intolerance -depression -fast, irregular heartbeat -hallucinations -memory loss -muscle weakness -pain or difficulty  passing urine -vomiting Side effects that usually do not require medical attention (report to your doctor or health care professional if they  continue or are bothersome): -constipation -dry mouth -nausea This list may not describe all possible side effects. Call your doctor for medical advice about side effects. You may report side effects to FDA at 1-800-FDA-1088. Where should I keep my medicine? Keep out of the reach of children. Store below 30 degrees C (86 degrees F). Keep container tightly closed. Throw away any unused medicine after the expiration date. NOTE: This sheet is a summary. It may not cover all possible information. If you have questions about this medicine, talk to your doctor, pharmacist, or health care provider.  2018 Elsevier/Gold Standard (2007-05-03 15:38:20)  

## 2017-05-24 ENCOUNTER — Other Ambulatory Visit: Payer: Self-pay | Admitting: Psychiatry

## 2017-05-24 DIAGNOSIS — F331 Major depressive disorder, recurrent, moderate: Secondary | ICD-10-CM

## 2017-07-04 ENCOUNTER — Ambulatory Visit: Payer: Medicare Other | Admitting: Psychiatry

## 2017-07-12 ENCOUNTER — Ambulatory Visit (INDEPENDENT_AMBULATORY_CARE_PROVIDER_SITE_OTHER): Payer: Medicare Other | Admitting: Psychiatry

## 2017-07-12 ENCOUNTER — Other Ambulatory Visit: Payer: Self-pay

## 2017-07-12 ENCOUNTER — Encounter: Payer: Self-pay | Admitting: Psychiatry

## 2017-07-12 VITALS — BP 105/72 | HR 96 | Temp 98.0°F | Wt 153.0 lb

## 2017-07-12 DIAGNOSIS — F410 Panic disorder [episodic paroxysmal anxiety] without agoraphobia: Secondary | ICD-10-CM | POA: Diagnosis not present

## 2017-07-12 DIAGNOSIS — F331 Major depressive disorder, recurrent, moderate: Secondary | ICD-10-CM

## 2017-07-12 DIAGNOSIS — F4310 Post-traumatic stress disorder, unspecified: Secondary | ICD-10-CM

## 2017-07-12 MED ORDER — PROPRANOLOL HCL 10 MG PO TABS: 10.0000 mg | ORAL_TABLET | Freq: Three times a day (TID) | ORAL | 2 refills | Status: DC | PRN

## 2017-07-12 MED ORDER — VILAZODONE HCL 10 MG PO TABS: 10.0000 mg | ORAL_TABLET | Freq: Every day | ORAL | 0 refills | Status: DC

## 2017-07-12 MED ORDER — ZOLPIDEM TARTRATE ER 12.5 MG PO TBCR: 12.5000 mg | EXTENDED_RELEASE_TABLET | Freq: Every evening | ORAL | 2 refills | Status: DC | PRN

## 2017-07-12 MED ORDER — BUPROPION HCL ER (XL) 150 MG PO TB24
300.0000 mg | ORAL_TABLET | Freq: Every day | ORAL | 2 refills | Status: DC
Start: 2017-07-12 — End: 2017-10-10

## 2017-07-12 NOTE — Patient Instructions (Signed)
Living With Depression Everyone experiences occasional disappointment, sadness, and loss in their lives. When you are feeling down, blue, or sad for at least 2 weeks in a row, it may mean that you have depression. Depression can affect your thoughts and feelings, relationships, daily activities, and physical health. It is caused by changes in the way your brain functions. If you receive a diagnosis of depression, your health care provider will tell you which type of depression you have and what treatment options are available to you. If you are living with depression, there are ways to help you recover from it and also ways to prevent it from coming back. How to cope with lifestyle changes Coping with stress Stress is your body's reaction to life changes and events, both good and bad. Stressful situations may include:  Getting married.  The death of a spouse.  Losing a job.  Retiring.  Having a baby.  Stress can last just a few hours or it can be ongoing. Stress can play a major role in depression, so it is important to learn both how to cope with stress and how to think about it differently. Talk with your health care provider or a counselor if you would like to learn more about stress reduction. He or she may suggest some stress reduction techniques, such as:  Music therapy. This can include creating music or listening to music. Choose music that you enjoy and that inspires you.  Mindfulness-based meditation. This kind of meditation can be done while sitting or walking. It involves being aware of your normal breaths, rather than trying to control your breathing.  Centering prayer. This is a kind of meditation that involves focusing on a spiritual word or phrase. Choose a word, phrase, or sacred image that is meaningful to you and that brings you peace.  Deep breathing. To do this, expand your stomach and inhale slowly through your nose. Hold your breath for 3-5 seconds, then exhale  slowly, allowing your stomach muscles to relax.  Muscle relaxation. This involves intentionally tensing muscles then relaxing them.  Choose a stress reduction technique that fits your lifestyle and personality. Stress reduction techniques take time and practice to develop. Set aside 5-15 minutes a day to do them. Therapists can offer training in these techniques. The training may be covered by some insurance plans. Other things you can do to manage stress include:  Keeping a stress diary. This can help you learn what triggers your stress and ways to control your response.  Understanding what your limits are and saying no to requests or events that lead to a schedule that is too full.  Thinking about how you respond to certain situations. You may not be able to control everything, but you can control how you react.  Adding humor to your life by watching funny films or TV shows.  Making time for activities that help you relax and not feeling guilty about spending your time this way.  Medicines Your health care provider may suggest certain medicines if he or she feels that they will help improve your condition. Avoid using alcohol and other substances that may prevent your medicines from working properly (may interact). It is also important to:  Talk with your pharmacist or health care provider about all the medicines that you take, their possible side effects, and what medicines are safe to take together.  Make it your goal to take part in all treatment decisions (shared decision-making). This includes giving input on the side   effects of medicines. It is best if shared decision-making with your health care provider is part of your total treatment plan.  If your health care provider prescribes a medicine, you may not notice the full benefits of it for 4-8 weeks. Most people who are treated for depression need to be on medicine for at least 6-12 months after they feel better. If you are taking  medicines as part of your treatment, do not stop taking medicines without first talking to your health care provider. You may need to have the medicine slowly decreased (tapered) over time to decrease the risk of harmful side effects. Relationships Your health care provider may suggest family therapy along with individual therapy and drug therapy. While there may not be family problems that are causing you to feel depressed, it is still important to make sure your family learns as much as they can about your mental health. Having your family's support can help make your treatment successful. How to recognize changes in your condition Everyone has a different response to treatment for depression. Recovery from major depression happens when you have not had signs of major depression for two months. This may mean that you will start to:  Have more interest in doing activities.  Feel less hopeless than you did 2 months ago.  Have more energy.  Overeat less often, or have better or improving appetite.  Have better concentration.  Your health care provider will work with you to decide the next steps in your recovery. It is also important to recognize when your condition is getting worse. Watch for these signs:  Having fatigue or low energy.  Eating too much or too little.  Sleeping too much or too little.  Feeling restless, agitated, or hopeless.  Having trouble concentrating or making decisions.  Having unexplained physical complaints.  Feeling irritable, angry, or aggressive.  Get help as soon as you or your family members notice these symptoms coming back. How to get support and help from others How to talk with friends and family members about your condition Talking to friends and family members about your condition can provide you with one way to get support and guidance. Reach out to trusted friends or family members, explain your symptoms to them, and let them know that you are  working with a health care provider to treat your depression. Financial resources Not all insurance plans cover mental health care, so it is important to check with your insurance carrier. If paying for co-pays or counseling services is a problem, search for a local or county mental health care center. They may be able to offer public mental health care services at low or no cost when you are not able to see a private health care provider. If you are taking medicine for depression, you may be able to get the generic form, which may be less expensive. Some makers of prescription medicines also offer help to patients who cannot afford the medicines they need. Follow these instructions at home:  Get the right amount and quality of sleep.  Cut down on using caffeine, tobacco, alcohol, and other potentially harmful substances.  Try to exercise, such as walking or lifting small weights.  Take over-the-counter and prescription medicines only as told by your health care provider.  Eat a healthy diet that includes plenty of vegetables, fruits, whole grains, low-fat dairy products, and lean protein. Do not eat a lot of foods that are high in solid fats, added sugars, or salt.    Keep all follow-up visits as told by your health care provider. This is important. Contact a health care provider if:  You stop taking your antidepressant medicines, and you have any of these symptoms: ? Nausea. ? Headache. ? Feeling lightheaded. ? Chills and body aches. ? Not being able to sleep (insomnia).  You or your friends and family think your depression is getting worse. Get help right away if:  You have thoughts of hurting yourself or others. If you ever feel like you may hurt yourself or others, or have thoughts about taking your own life, get help right away. You can go to your nearest emergency department or call:  Your local emergency services (911 in the [place]  A suicide crisis helpline, such as the  [facility] at (289) 349-3105. This is open 24-hours a day.  Summary  If you are living with depression, there are ways to help you recover from it and also ways to prevent it from coming back.  Work with your health care team to create a management plan that includes counseling, stress management techniques, and healthy lifestyle habits. This information is not intended to replace advice given to you by your health care provider. Make sure you discuss any questions you have with your health care provider. Document Released: 01/05/2016 Document Revised: 01/05/2016 Document Reviewed: 01/05/2016 Elsevier Interactive Patient Education  2018 [place] After being diagnosed with an anxiety disorder, you may be relieved to know why you have felt or behaved a certain way. It is natural to also feel overwhelmed about the treatment ahead and what it will mean for your life. With care and support, you can manage this condition and recover from it. How to cope with anxiety Dealing with stress Stress is your body's reaction to life changes and events, both good and bad. Stress can last just a few hours or it can be ongoing. Stress can play a major role in anxiety, so it is important to learn both how to cope with stress and how to think about it differently. Talk with your health care provider or a counselor to learn more about stress reduction. He or she may suggest some stress reduction techniques, such as:  Music therapy. This can include creating or listening to music that you enjoy and that inspires you.  Mindfulness-based meditation. This involves being aware of your normal breaths, rather than trying to control your breathing. It can be done while sitting or walking.  Centering prayer. This is a kind of meditation that involves focusing on a word, phrase, or sacred image that is meaningful to you and that brings you peace.  Deep breathing. To do  this, expand your stomach and inhale slowly through your nose. Hold your breath for 3-5 seconds. Then exhale slowly, allowing your stomach muscles to relax.  Self-talk. This is a skill where you identify thought patterns that lead to anxiety reactions and correct those thoughts.  Muscle relaxation. This involves tensing muscles then relaxing them.  Choose a stress reduction technique that fits your lifestyle and personality. Stress reduction techniques take time and practice. Set aside 5-15 minutes a day to do them. Therapists can offer training in these techniques. The training may be covered by some insurance plans. Other things you can do to manage stress include:  Keeping a stress diary. This can help you learn what triggers your stress and ways to control your response.  Thinking about how you respond to certain situations. You may  not be able to control everything, but you can control your reaction.  Making time for activities that help you relax, and not feeling guilty about spending your time in this way.  Therapy combined with coping and stress-reduction skills provides the best chance for successful treatment. Medicines Medicines can help ease symptoms. Medicines for anxiety include:  Anti-anxiety drugs.  Antidepressants.  Beta-blockers.  Medicines may be used as the main treatment for anxiety disorder, along with therapy, or if other treatments are not working. Medicines should be prescribed by a health care provider. Relationships Relationships can play a big part in helping you recover. Try to spend more time connecting with trusted friends and family members. Consider going to couples counseling, taking family education classes, or going to family therapy. Therapy can help you and others better understand the condition. How to recognize changes in your condition Everyone has a different response to treatment for anxiety. Recovery from anxiety happens when symptoms decrease  and stop interfering with your daily activities at home or work. This may mean that you will start to:  Have better concentration and focus.  Sleep better.  Be less irritable.  Have more energy.  Have improved memory.  It is important to recognize when your condition is getting worse. Contact your health care provider if your symptoms interfere with home or work and you do not feel like your condition is improving. Where to find help and support: You can get help and support from these sources:  Self-help groups.  Online and [employer].  A trusted spiritual leader.  Couples counseling.  Family education classes.  Family therapy.  Follow these instructions at home:  Eat a healthy diet that includes plenty of vegetables, fruits, whole grains, low-fat dairy products, and lean protein. Do not eat a lot of foods that are high in solid fats, added sugars, or salt.  Exercise. Most adults should do the following: ? Exercise for at least 150 minutes each week. The exercise should increase your heart rate and make you sweat (moderate-intensity exercise). ? Strengthening exercises at least twice a week.  Cut down on caffeine, tobacco, alcohol, and other potentially harmful substances.  Get the right amount and quality of sleep. Most adults need 7-9 hours of sleep each night.  Make choices that simplify your life.  Take over-the-counter and prescription medicines only as told by your health care provider.  Avoid caffeine, alcohol, and certain over-the-counter cold medicines. These may make you feel worse. Ask your pharmacist which medicines to avoid.  Keep all follow-up visits as told by your health care provider. This is important. Questions to ask your health care provider  Would I benefit from therapy?  How often should I follow up with a health care provider?  How long do I need to take medicine?  Are there any long-term side effects of my medicine?  Are  there any alternatives to taking medicine? Contact a health care provider if:  You have a hard time staying focused or finishing daily tasks.  You spend many hours a day feeling worried about everyday life.  You become exhausted by worry.  You start to have headaches, feel tense, or have nausea.  You urinate more than normal.  You have diarrhea. Get help right away if:  You have a racing heart and shortness of breath.  You have thoughts of hurting yourself or others. If you ever feel like you may hurt yourself or others, or have thoughts about taking your own life,  get help right away. You can go to your nearest emergency department or call:  Your local emergency services (911 in the [place]  A suicide crisis helpline, such as the [facility] at 2365594372. This is open 24-hours a day.  Summary  Taking steps to deal with stress can help calm you.  Medicines cannot cure anxiety disorders, but they can help ease symptoms.  Family, friends, and partners can play a big part in helping you recover from an anxiety disorder. This information is not intended to replace advice given to you by your health care provider. Make sure you discuss any questions you have with your health care provider. Document Released: 01/27/2016 Document Revised: 01/27/2016 Document Reviewed: 01/27/2016 Elsevier Interactive Patient Education  2018 [place] oral tablet What is this medicine? VILAZODONE (vil [place] oh done) is used to treat depression. This medicine may be used for other purposes; ask your health care provider or pharmacist if you have questions. COMMON BRAND NAME(S): [id number] What should I tell my health care provider before I take this medicine? They need to know if you have any of these conditions: -bipolar disorder or a family history of bipolar disorder -glaucoma -liver disease -low levels of sodium in the blood -receiving electroconvulsive  therapy -seizures (convulsions) -suicidal thoughts, plans, or attempt by you or a family member -an unusual or allergic reaction to vilazodone, other medicines, foods, dyes or preservatives -pregnant or trying to get pregnant -breast-feeding How should I use this medicine? Take this medicine by mouth with a glass of water. Follow the directions on the prescription label. Take this medicine with food. Take your medicine at regular intervals. Do not take your medicine more often than directed. Do not stop taking this medicine suddenly except upon the advice of your doctor. Stopping this medicine too quickly may cause serious side effects or your condition may worsen. A special MedGuide will be given to you by the pharmacist with each prescription and refill. Be sure to read this information carefully each time. Overdosage: If you think you have taken too much of this medicine contact a poison control center or emergency room at once. NOTE: This medicine is only for you. Do not share this medicine with others. What if I miss a dose? If you miss a dose, take it as soon as you can. If it is almost time for your next dose, take only that dose. Do not take double or extra doses. What may interact with this medicine? Do not take this medicine with any of the following medications: -linezolid -MAOIs like Carbex, Eldepryl, Marplan, Nardil, and Parnate -methylene blue (injected into a vein) This medicine may also interact with the following medications: -amphetamines -aspirin and aspirin-like medicines -buspirone -certain diet drugs like dexfenfluramine, fenfluramine, phentermine, sibutramine -certain migraine headache medicines like almotriptan, eletriptan, frovatriptan, naratriptan, rizatriptan, sumatriptan, zolmitriptan -certain medicines that treat or prevent blood clots like warfarin, enoxaparin, and dalteparin -certain medicines that treat infections like clarithromycin, itraconazole,  voriconazole, ketoconazole, rifampin -certain medicines that treat seizures like carbamazepine and phenytoin -digoxin -fentanyl -lithium -NSAIDS, medicines for pain and inflammation, like ibuprofen or naproxen -other medicines for depression, anxiety, or psychotic disturbances -St. John's Wort -tramadol -tryptophan This list may not describe all possible interactions. Give your health care provider a list of all the medicines, herbs, non-prescription drugs, or dietary supplements you use. Also tell them if you smoke, drink alcohol, or use illegal drugs. Some items may interact with your medicine. What should I watch  for while using this medicine? Tell your doctor if your symptoms do not get better or if they get worse. Visit your doctor or health care professional for regular checks on your progress. Because it may take several weeks to see the full effects of this medicine, it is important to continue your treatment as prescribed by your doctor. Patients and their families should watch out for new or worsening thoughts of suicide or depression. Also watch out for sudden changes in feelings such as feeling anxious, agitated, panicky, irritable, hostile, aggressive, impulsive, severely restless, overly excited and hyperactive, or not being able to sleep. If this happens, especially at the beginning of treatment or after a change in dose, call your health care professional. Dennis Bast may get drowsy or dizzy. Do not drive, use machinery, or do anything that needs mental alertness until you know how this medicine affects you. Do not stand or sit up quickly, especially if you are an older patient. This reduces the risk of dizzy or fainting spells. Alcohol may interfere with the effect of this medicine. Avoid alcoholic drinks. Your mouth may get dry. Chewing sugarless gum or sucking hard candy, and drinking plenty of water may help. Contact your doctor if the problem does not go away or is severe. What side  effects may I notice from receiving this medicine? Side effects that you should report to your doctor or health care professional as soon as possible: -allergic reactions like skin rash, itching or hives, swelling of the face, lips, or tongue -anxious -black, tarry stools -changes in vision -confusion -elevated mood, decreased need for sleep, racing thoughts, impulsive behavior -eye pain -fast, irregular heartbeat -feeling faint or lightheaded, falls -feeling agitated, angry, or irritable -hallucination, loss of contact with reality -loss of balance or coordination -loss of memory -restlessness, pacing, inability to keep still -seizures -stiff muscles -suicidal thoughts or other mood changes -trouble sleeping -unusual bleeding or bruising -unusually weak or tired -vomiting Side effects that usually do not require medical attention (report to your doctor or health care professional if they continue or are bothersome): -change in appetite or weight -change in sex drive or performance -diarrhea -drowsiness -dry mouth -increased sweating -nausea -tremors This list may not describe all possible side effects. Call your doctor for medical advice about side effects. You may report side effects to FDA at 1-800-FDA-1088. Where should I keep my medicine? Keep out of the reach of children. Store at room temperature between 15 and 30 degrees C (59 to 86 degrees F). Throw away any unused medicine after the expiration date. NOTE: This sheet is a summary. It may not cover all possible information. If you have questions about this medicine, talk to your doctor, pharmacist, or health care provider.  2018 Elsevier/[employer] (2015-09-30 12:50:48)  PLEASE CUT YOUR ZOLOFT IN TO HALF AND TAKE THE HALF TABLET FOR 4 DAYS AND THEN START TAKING QUARTER TABLET FOR 4 DAYS AND STOP TAKING IT.

## 2017-07-12 NOTE — Progress Notes (Signed)
[facility] MD OP Progress Note  07/12/2017 4:05 PM AMATULLAH CHRISTY  MRN:  [id number]  Chief Complaint: ' I am here for follow up." Chief Complaint    Follow-up; Medication Refill     HPI: [name] is a 42 year old Caucasian female, divorced, on SSD, lives in [place], has a history of depression, anxiety, presented to the clinic today for a follow-up visit.   She reports she has been struggling with some worsening depressive symptoms since the past few weeks.  She reports anhedonia, feeling sad, sleep issues, feeling tired, unable to concentrate, moving slowly and so on.  Patient reports she does not think the Abilify is actually helping her.  She also wonders whether her Abilify is making her more slowed down.  Patient denies any suicidality.  Patient denies any perceptual disturbances.  Patient reports she continues to struggle with her daughter's health issues.  Her daughter who is 71 years old continue to struggle with constipation.  Patient also struggles with her ex-husband who is her child's father's health issues .  She reports she started psychotherapy sessions again with Ms. Miguel Dibble however she does not know if she can go for more frequent visits due to her having to take care of her child who is only 32 years old and having no help available.  Discussed with patient about a trial of a new antidepressant.  Patient has been on the Wellbutrin and Zoloft for a long time.  Patient completed a PHQ 9 and scored very high-22.  Discussed with patient that the Zoloft can be tapered down and she can be started on a medication called Viibryd.  Patient agrees with plan.  Provided medication education.  Visit Diagnosis:    ICD-10-CM   1. MDD (major depressive disorder), recurrent episode, moderate (HCC) F33.1 buPROPion (WELLBUTRIN XL) 150 MG 24 hr tablet    zolpidem (AMBIEN CR) 12.5 MG CR tablet  2. Panic disorder F41.0 propranolol (INDERAL) 10 MG tablet  3. PTSD (post-traumatic stress  disorder) F43.10     Past Psychiatric History: I have reviewed past psychiatric history from my progress note on 05/09/2017  Past Medical History:  Past Medical History:  Diagnosis Date  . Anxiety   . Complication of anesthesia    difficulty to get sedated during endoscopy  . Depression   . Headache   . Neuropathy   . Pneumonia    within past five years  . PTSD (post-traumatic stress disorder)   . Seizures ([place])     Past Surgical History:  Procedure Laterality Date  . ABDOMINAL HYSTERECTOMY    . BILATERAL SALPINGECTOMY Bilateral 09/29/2015   Procedure: BILATERAL SALPINGECTOMY;  Surgeon: Benjaman Kindler, MD;  Location: [facility] ORS;  Service: Gynecology;  Laterality: Bilateral;  . bowel obstruction  2011  . CHOLECYSTECTOMY    . GASTRIC BYPASS    . gi bleed  2009   Surgery-was in ICU for three weeks  . HERNIA REPAIR    . OVARIAN CYST REMOVAL Left 09/29/2015   Procedure: OVARIAN CYSTECTOMY;  Surgeon: Benjaman Kindler, MD;  Location: [facility] ORS;  Service: Gynecology;  Laterality: Left;  [name] VAGINAL HYSTERECTOMY N/A 09/29/2015   Procedure: HYSTERECTOMY VAGINAL;  Surgeon: Benjaman Kindler, MD;  Location: [facility] ORS;  Service: Gynecology;  Laterality: N/A;    Family Psychiatric History: Reviewed family psychiatric history from my progress note on 05/09/2017.  Family History:  Family History  Problem Relation Age of Onset  . Arthritis/Rheumatoid Mother   . Heart block Mother   . Clotting  disorder Mother   . Osteoporosis Mother   . Heart attack Mother   . Anxiety disorder Mother   . Depression Mother   . Heart disease Father   . Heart attack Father   . Depression Sister   . Anxiety disorder Sister     Social History: Divorced.  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  She has 2 children, 48 year old son and a daughter who is 71 years old.  She lives with her mother, her children and [place]. Social History   Socioeconomic History  . Marital status: Single     Spouse name: Not on file  . Number of children: Not on file  . Years of education: Not on file  . Highest education level: Not on file  Occupational History  . Not on file  Social Needs  . Financial resource strain: Not on file  . Food insecurity:    Worry: Not on file    Inability: Not on file  . Transportation needs:    Medical: Not on file    Non-medical: Not on file  Tobacco Use  . Smoking status: Never Smoker  . Smokeless tobacco: Never Used  Substance and Sexual Activity  . Alcohol use: No    Alcohol/week: 0.0 oz  . Drug use: No  . Sexual activity: Yes    Birth control/protection: Surgical  Lifestyle  . Physical activity:    Days per week: Not on file    Minutes per session: Not on file  . Stress: Not on file  Relationships  . Social connections:    Talks on phone: Not on file    Gets together: Not on file    Attends religious service: Not on file    Active member of club or organization: Not on file    Attends meetings of clubs or organizations: Not on file    Relationship status: Not on file  Other Topics Concern  . Not on file  Social History Narrative  . Not on file    Allergies:  Allergies  Allergen Reactions  . Amoxicillin Anaphylaxis, Hives, Shortness Of Breath, Swelling and Rash    Has patient had a PCN reaction causing immediate rash, facial/tongue/throat swelling, SOB or lightheadedness with hypotension: Yes Has patient had a PCN reaction causing severe rash involving mucus membranes or skin necrosis: Yes Has patient had a PCN reaction that required hospitalization No Has patient had a PCN reaction occurring within the last 10 years: No If all of the above answers are "NO", then may proceed with Cephalosporin use. Other reaction(s): ANAPHYLAXIS Other reaction(s): ANAPHYLAX  . Penicillins Anaphylaxis, Swelling and Rash    Has patient had a PCN reaction causing immediate rash, facial/tongue/throat swelling, SOB or lightheadedness with hypotension:  Yes Has patient had a PCN reaction causing severe rash involving mucus membranes or skin necrosis: Yes Has patient had a PCN reaction that required hospitalization No Has patient had a PCN reaction occurring within the last 10 years: No If all of the above answers are "NO", then may proceed with Cephalosporin use.   [name] Morphine Other (See Comments)    Muscle spasms  . Sulfa Antibiotics     Other reaction(s): VOMITING    Metabolic Disorder Labs: Lab Results  Component Value Date   HGBA1C 4.8 03/23/2014   No results found for: PROLACTIN No results found for: CHOL, TRIG, HDL, CHOLHDL, VLDL, LDLCALC Lab Results  Component Value Date   TSH  05/13/2009    3.163 (  NOTE) Please note change in reference ranges for ages 60W to 36Y. Test methodology is 3rd generation TSH   TSH 1.503 Test methodology is 3rd generation TSH 09/05/2008    Therapeutic Level Labs: No results found for: LITHIUM No results found for: VALPROATE No components found for:  CBMZ  Current Medications: Current Outpatient Medications  Medication Sig Dispense Refill  . acetaminophen (TYLENOL) 325 MG tablet Take 650 mg by mouth every 4 (four) hours as needed for moderate pain.     [name] buPROPion (WELLBUTRIN XL) 150 MG 24 hr tablet Take 2 tablets (300 mg total) by mouth daily. 60 tablet 2  . cyanocobalamin (,VITAMIN B-12,) 1000 MCG/ML injection Inject 1,000 mcg into the muscle every 30 (thirty) days.     . diclofenac sodium (VOLTAREN) 1 % GEL APPLY 4 GMS EVERY 6 HOURS AS NEEDED FOR PAIN    . docusate sodium (COLACE) 100 MG capsule Take 1 capsule (100 mg total) by mouth daily as needed for mild constipation. 60 capsule 3  . GLUCAGON EMERGENCY 1 MG injection INJECT 1 MG INTO THE MUSCLE ONCE AS NEEDED FOR LOW BLOOD SUGAR.  11  . Multiple Vitamins-Minerals (HM MULTIVITAMIN ADULT GUMMY) CHEW Chew 2 tablets by mouth daily.    [name] oxyCODONE-acetaminophen (PERCOCET) 5-325 MG tablet Take 1-2 tablets by mouth every 6 (six) hours as needed  for severe pain. 20 tablet 0  . pantoprazole (PROTONIX) 40 MG tablet TAKE 1 TABLET (40 MG TOTAL) BY MOUTH EVERY 12 (TWELVE) HOURS.  8  . propranolol (INDERAL) 10 MG tablet Take 1 tablet (10 mg total) by mouth 3 (three) times daily as needed. 90 tablet 2  . sucralfate (CARAFATE) 1 G tablet TAKE 1 TABLET (1 G TOTAL) BY MOUTH 4 (FOUR) TIMES DAILY BEFORE MEALS AND NIGHTLY.  7  . SUMAtriptan (IMITREX) 100 MG tablet Take 100 mg by mouth every 2 (two) hours as needed for migraine or headache.     . thiamine (VITAMIN B-1) 100 MG tablet Take 100 mg by mouth.    . traMADol (ULTRAM) 50 MG tablet TAKE 50-100 MG EVERY 4 HOURS AS NEEDED FOR PAIN NO MORE THAN 8 TABS IN 24 HOURS  0  . zolpidem (AMBIEN CR) 12.5 MG CR tablet Take 1 tablet (12.5 mg total) by mouth at bedtime as needed for sleep. 30 tablet 2  . topiramate (TOPAMAX) 25 MG tablet Take 75 mg by mouth 2 (two) times daily. 1 tablet in the am, 2 tablets in the pm    . Vilazodone HCl (VIIBRYD) 10 MG TABS Take 1 tablet (10 mg total) by mouth daily. 30 tablet 0   No current facility-administered medications for this visit.      Musculoskeletal: Strength & Muscle Tone: within normal limits Gait & Station: normal Patient leans: N/A  Psychiatric Specialty Exam: Review of Systems  Psychiatric/Behavioral: Positive for depression. The patient is nervous/anxious.   All other systems reviewed and are negative.   Blood pressure 105/72, pulse 96, temperature 98 F (36.7 C), temperature source Oral, weight 153 lb (69.4 kg).Body mass index is 23.96 kg/m.  General Appearance: Casual  Eye Contact:  Fair  Speech:  Clear and Coherent  Volume:  Normal  Mood:  Anxious, Depressed and Dysphoric  Affect:  Congruent  Thought Process:  Goal Directed and Descriptions of Associations: Intact  Orientation:  Full (Time, Place, and Person)  Thought Content: Logical   Suicidal Thoughts:  No  Homicidal Thoughts:  No  Memory:  Immediate;   Fair Recent;  Fair Remote;    Fair  Judgement:  Fair  Insight:  Fair  Psychomotor Activity:  Normal  Concentration:  Concentration: Fair and Attention Span: Fair  Recall:  [employer] of Knowledge: Fair  Language: Fair  Akathisia:  No  Handed:  Right  AIMS (if indicated): 0  Assets:  Communication Skills Desire for Improvement Housing Social Support Talents/Skills  ADL's:  Intact  Cognition: WNL  Sleep:  Fair   Screenings:phq 9   Assessment and Plan: Storey is a 42 year old Caucasian female who has a history of depression, panic disorder, history of trauma, presented to the clinic today for a follow-up visit.  Patient continues to struggle with psychosocial stressors including her child's health issues, mother going through health problems, ex-husband recently diagnosed with neurological problems and so on.  Patient has started psychotherapy sessions again with Ms. Miguel Dibble however is unable to go for frequent visits due to having no support to take care of her 56-year-old child.  Patient however is motivated to make medication readjustment today.  Discussed plan as noted below.  Plan For depression Continue the reduced dose of Wellbutrin XL at 300 mg p.o. Daily. Discussed with her about the risk of lowering the seizure threshold on Wellbutrin.  She does have a history of seizures.  She however reports she is aware of that and that this was mentioned to her in the past by her other providers.  She however has been on this medication for years and would like to stay on the same at this time. We will taper of Zoloft for lack of efficacy.  Patient provided written instruction to do so. Discontinue Abilify for lack of efficacy. Discontinue Cogentin. Start Viibryd 10 mg p.o. Daily. PHQ 9 =22  Anxiety symptoms Continue propranolol 10 mg p.o. 3 times daily as needed Patient will continue psychotherapy with Ms. Miguel Dibble.  For insomnia Continue Ambien CR 12.5 mg p.o. nightly.  Follow-up in clinic in 3  weeks or sooner if needed.  More than 50 % of the time was spent for psychoeducation and supportive psychotherapy and care coordination.  This note was generated in part or whole with voice recognition software. Voice recognition is usually quite accurate but there are transcription errors that can and very often do occur. I apologize for any typographical errors that were not detected and corrected.        Ursula Alert, MD 07/13/2017, 12:28 PM

## 2017-07-13 ENCOUNTER — Encounter: Payer: Self-pay | Admitting: Psychiatry

## 2017-07-15 ENCOUNTER — Telehealth: Payer: Self-pay

## 2017-07-15 NOTE — Telephone Encounter (Signed)
priior [name] was entered on 07-12-17 and is pending review.

## 2017-07-15 NOTE — Telephone Encounter (Signed)
received fax requesting a prior auth on the viibry 10mg .

## 2017-07-27 NOTE — Telephone Encounter (Signed)
Approved pharmacy notified

## 2017-08-02 ENCOUNTER — Ambulatory Visit (INDEPENDENT_AMBULATORY_CARE_PROVIDER_SITE_OTHER): Payer: Medicare Other | Admitting: Psychiatry

## 2017-08-02 ENCOUNTER — Encounter: Payer: Self-pay | Admitting: Psychiatry

## 2017-08-02 VITALS — BP 107/73 | HR 90 | Ht 67.0 in | Wt 149.4 lb

## 2017-08-02 DIAGNOSIS — F410 Panic disorder [episodic paroxysmal anxiety] without agoraphobia: Secondary | ICD-10-CM | POA: Diagnosis not present

## 2017-08-02 DIAGNOSIS — F331 Major depressive disorder, recurrent, moderate: Secondary | ICD-10-CM

## 2017-08-02 IMAGING — CR DG CHEST 2V
2 series · 2 of 2 positions shown · non-contrast
Comparison: None.

CLINICAL DATA: Cough, shortness of breath.

EXAM:
CHEST  2 VIEW

[w chest pa]
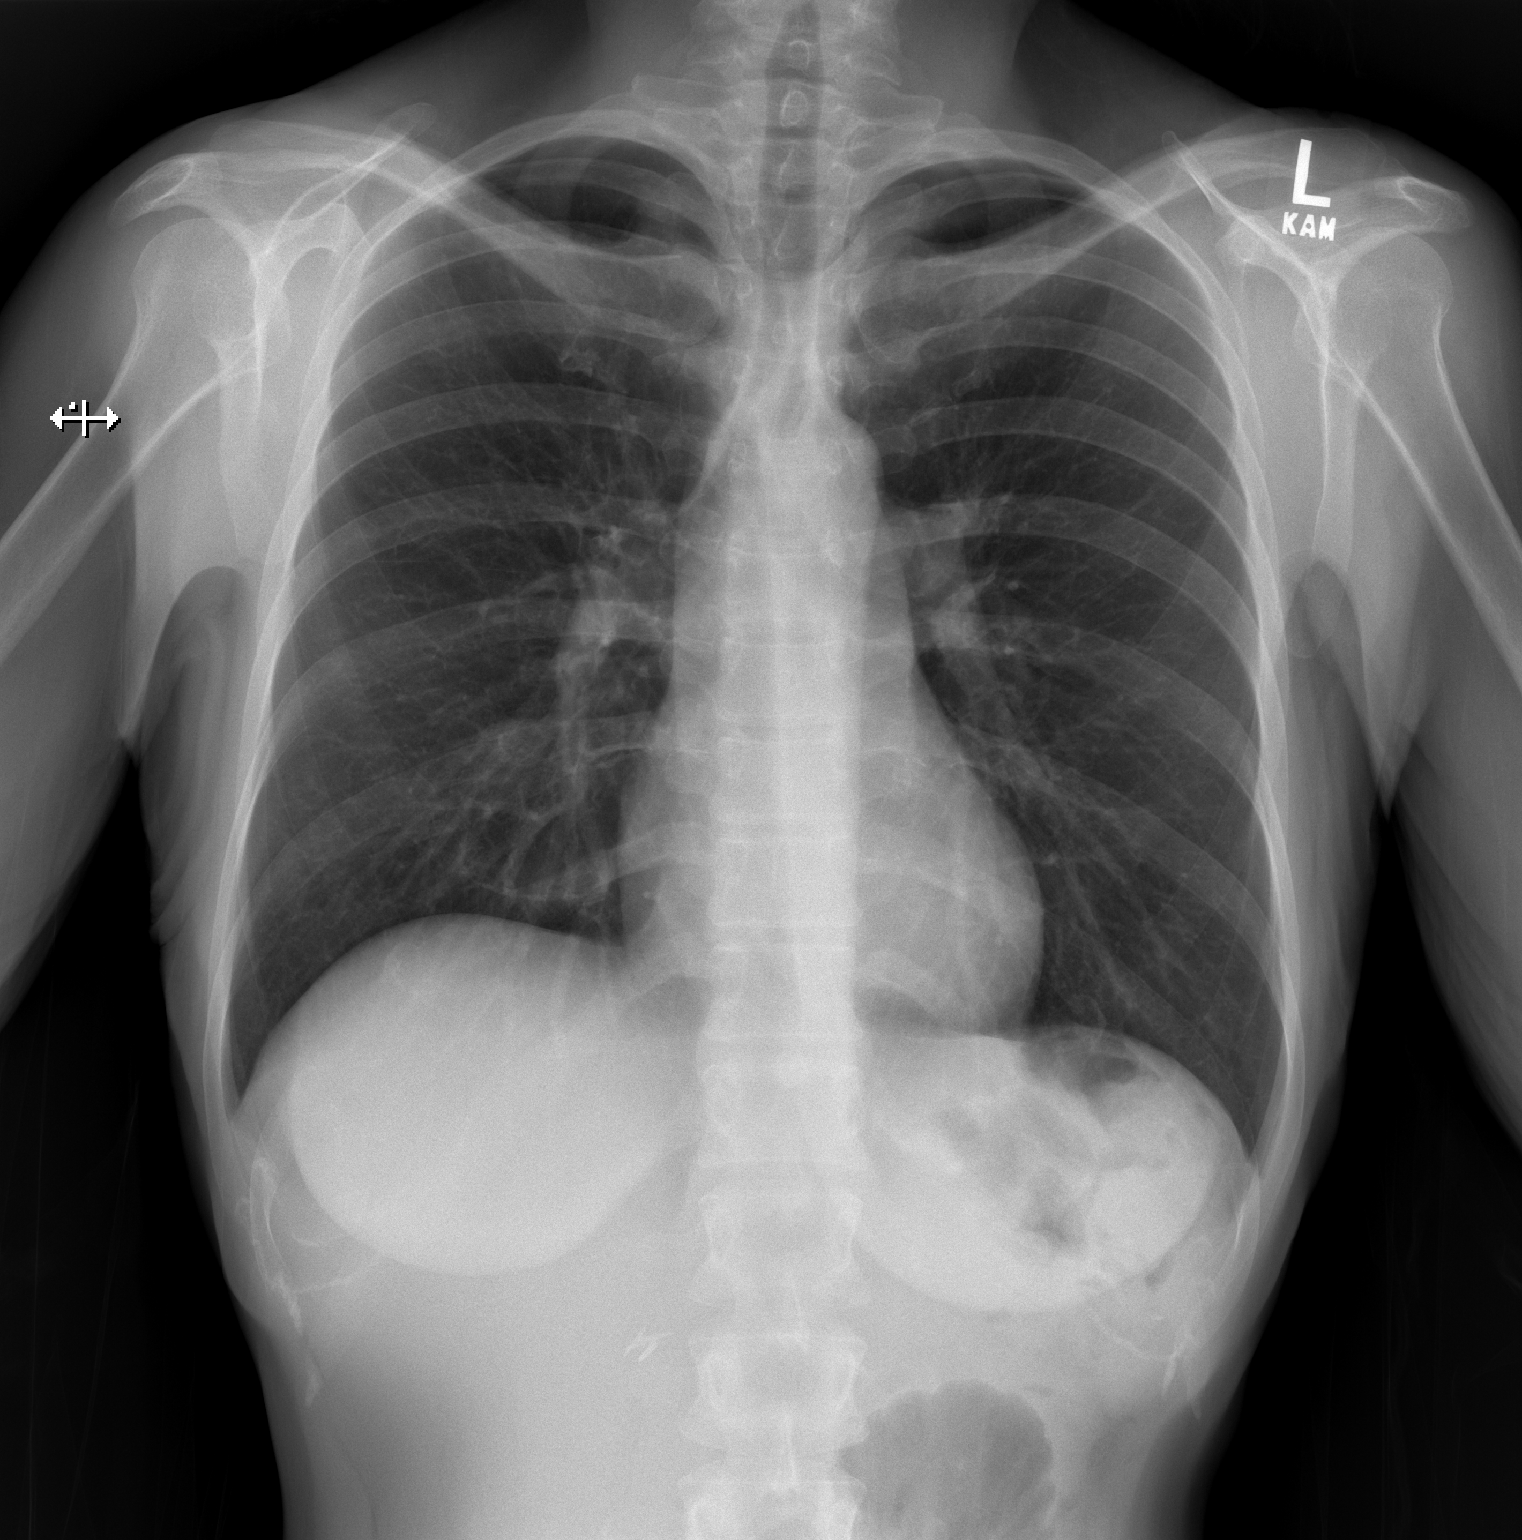

[w chest lat]
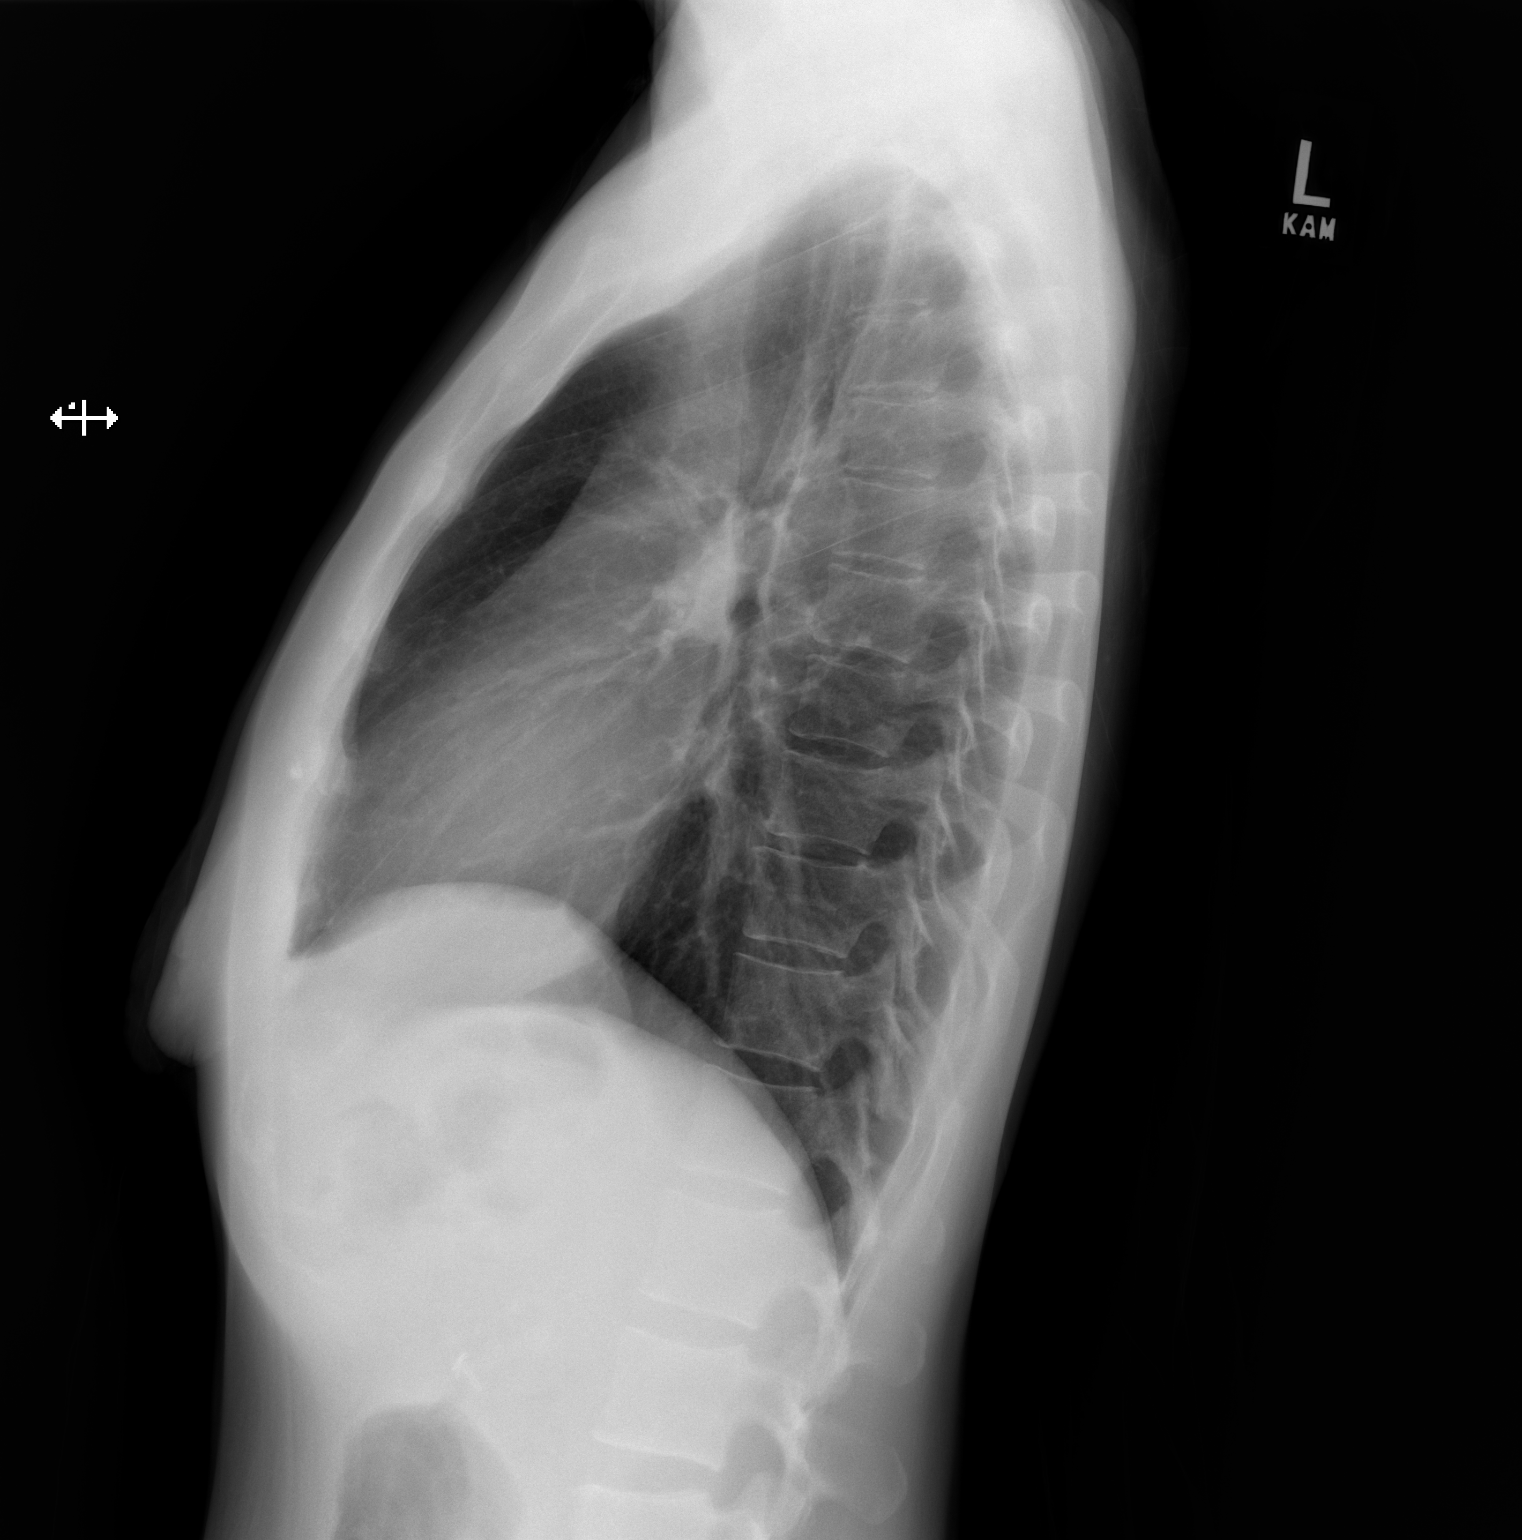

[2 of 2 positions shown; findings below may reference images not displayed]

FINDINGS: The heart size and mediastinal contours are within normal limits.
Both lungs are clear. No pneumothorax or pleural effusion is noted.
The visualized skeletal structures are unremarkable.
IMPRESSION: No active cardiopulmonary disease.

## 2017-08-02 MED ORDER — VILAZODONE HCL 20 MG PO TABS
20.0000 mg | ORAL_TABLET | Freq: Every day | ORAL | 1 refills | Status: DC
Start: 2017-08-02 — End: 2017-08-15

## 2017-08-02 MED ORDER — ALPRAZOLAM 0.25 MG PO TABS: 0.2500 mg | ORAL_TABLET | Freq: Every day | ORAL | 0 refills | Status: DC | PRN

## 2017-08-02 NOTE — Patient Instructions (Signed)
PLEASE CALL ME BACK BEGINNING OF July ABOUT YOUR MEDICATION.

## 2017-08-02 NOTE — Progress Notes (Signed)
[facility] MD OP Progress Note  08/02/2017 5:23 PM [name]  MRN:  [id number]  Chief Complaint: ' I am here for follow up." Chief Complaint    Follow-up     HPI: [name] is a 42 year old Caucasian female, divorced, on SSD, lives in [place], has a history of depression, anxiety, presented to the clinic today for a follow-up visit.  Patient today reports she continues to struggle with some depressive symptoms.  She was able to come off of the Zoloft as discussed.  She reports she continues to have some dizziness after coming off of it.  She however reports it as getting better.  She reports she knows it is due to the Zoloft since she has had dizziness in the past when she would miss a dose here and there.  Reports she believes that she is tolerating the Viibryd well.  She denies any concerns at this time.  She however does not think the dosages where it needs to be.  She reports she would like her medication dosage to be increased.  Patient continues to have several psychosocial stressors.  She reports her mother is going through a lot of health issues.  She was recently diagnosed with basal cell carcinoma and is undergoing treatment.  Patient reports that has been very stressful.  Patient also reports that her daughter is going to have some dental work done.  She reports they are going to sedate her and she is very worried about that.  Patient reports she goes into a panic mode whenever she thinks about it.  Patient became very tearful when she discussed this.  She wonders whether she can be given any medication just for that day so that she can go through with it and help her daughter better without going into a panic mode.  Patient reports she has been using the propranolol however  she does not think that is enough when she has a real panic attack.  She reports she has tried Xanax in the past and wonders whether she can retry it just for a day.  Patient was referred to Ms. Miguel Dibble for  psychotherapy session.  Patient is familiar with Otila Kluver and has been to her in the past.  Patient reports due to her mother's health issues she was unable to find a babysitter for her daughter.  Patient reports she hence kept rescheduling the appointment.  She denies any suicidality.  Patient denies any perceptual disturbances. Visit Diagnosis:    ICD-10-CM   1. Panic disorder F41.0   2. MDD (major depressive disorder), recurrent episode, moderate ([place]) F33.1     Past Psychiatric History: Reviewed past psychiatric history from my progress note on 05/09/2017.  Past Medical History:  Past Medical History:  Diagnosis Date  . Anxiety   . Complication of anesthesia    difficulty to get sedated during endoscopy  . Depression   . Headache   . Neuropathy   . Pneumonia    within past five years  . PTSD (post-traumatic stress disorder)   . Seizures ([place])     Past Surgical History:  Procedure Laterality Date  . ABDOMINAL HYSTERECTOMY    . BILATERAL SALPINGECTOMY Bilateral 09/29/2015   Procedure: BILATERAL SALPINGECTOMY;  Surgeon: Benjaman Kindler, MD;  Location: [facility] ORS;  Service: Gynecology;  Laterality: Bilateral;  . bowel obstruction  2011  . CHOLECYSTECTOMY    . GASTRIC BYPASS    . gi bleed  2009   Surgery-was in ICU for three weeks  .  HERNIA REPAIR    . OVARIAN CYST REMOVAL Left 09/29/2015   Procedure: OVARIAN CYSTECTOMY;  Surgeon: Benjaman Kindler, MD;  Location: [facility] ORS;  Service: Gynecology;  Laterality: Left;  [name] VAGINAL HYSTERECTOMY N/A 09/29/2015   Procedure: HYSTERECTOMY VAGINAL;  Surgeon: Benjaman Kindler, MD;  Location: [facility] ORS;  Service: Gynecology;  Laterality: N/A;    Family Psychiatric History: Reviewed Family psychiatric history from my progress note on 05/09/2017  Family History:  Family History  Problem Relation Age of Onset  . Arthritis/Rheumatoid Mother   . Heart block Mother   . Clotting disorder Mother   . Osteoporosis Mother   . Heart attack Mother   .  Anxiety disorder Mother   . Depression Mother   . Heart disease Father   . Heart attack Father   . Depression Sister   . Anxiety disorder Sister     Social History: Patient is divorced.  She is on SSD.  She has 2 children.  14 year old son and a daughter who is 9 years old.  She lives with her mother, her children in [place]. Social History   Socioeconomic History  . Marital status: Single    Spouse name: Not on file  . Number of children: Not on file  . Years of education: Not on file  . Highest education level: Not on file  Occupational History  . Not on file  Social Needs  . Financial resource strain: Not on file  . Food insecurity:    Worry: Not on file    Inability: Not on file  . Transportation needs:    Medical: Not on file    Non-medical: Not on file  Tobacco Use  . Smoking status: Never Smoker  . Smokeless tobacco: Never Used  Substance and Sexual Activity  . Alcohol use: No    Alcohol/week: 0.0 oz  . Drug use: No  . Sexual activity: Yes    Birth control/protection: Surgical  Lifestyle  . Physical activity:    Days per week: Not on file    Minutes per session: Not on file  . Stress: Not on file  Relationships  . Social connections:    Talks on phone: Not on file    Gets together: Not on file    Attends religious service: Not on file    Active member of club or organization: Not on file    Attends meetings of clubs or organizations: Not on file    Relationship status: Not on file  Other Topics Concern  . Not on file  Social History Narrative  . Not on file    Allergies:  Allergies  Allergen Reactions  . Amoxicillin Anaphylaxis, Hives, Shortness Of Breath, Swelling and Rash    Has patient had a PCN reaction causing immediate rash, facial/tongue/throat swelling, SOB or lightheadedness with hypotension: Yes Has patient had a PCN reaction causing severe rash involving mucus membranes or skin necrosis: Yes Has patient had a PCN reaction that required  hospitalization No Has patient had a PCN reaction occurring within the last 10 years: No If all of the above answers are "NO", then may proceed with Cephalosporin use. Other reaction(s): ANAPHYLAXIS Other reaction(s): ANAPHYLAX  . Penicillins Anaphylaxis, Swelling and Rash    Has patient had a PCN reaction causing immediate rash, facial/tongue/throat swelling, SOB or lightheadedness with hypotension: Yes Has patient had a PCN reaction causing severe rash involving mucus membranes or skin necrosis: Yes Has patient had a PCN reaction that required hospitalization No Has patient had  a PCN reaction occurring within the last 10 years: No If all of the above answers are "NO", then may proceed with Cephalosporin use.   [name] Morphine Other (See Comments)    Muscle spasms  . Sulfa Antibiotics     Other reaction(s): VOMITING    Metabolic Disorder Labs: Lab Results  Component Value Date   HGBA1C 4.8 03/23/2014   No results found for: PROLACTIN No results found for: CHOL, TRIG, HDL, CHOLHDL, VLDL, LDLCALC Lab Results  Component Value Date   TSH  05/13/2009    3.163 (NOTE) Please note change in reference ranges for ages 26W to 42Y. Test methodology is 3rd generation TSH   TSH 1.503 Test methodology is 3rd generation TSH 09/05/2008    Therapeutic Level Labs: No results found for: LITHIUM No results found for: VALPROATE No components found for:  CBMZ  Current Medications: Current Outpatient Medications  Medication Sig Dispense Refill  . acetaminophen (TYLENOL) 325 MG tablet Take 650 mg by mouth every 4 (four) hours as needed for moderate pain.     [name] buPROPion (WELLBUTRIN XL) 150 MG 24 hr tablet Take 2 tablets (300 mg total) by mouth daily. 60 tablet 2  . cyanocobalamin (,VITAMIN B-12,) 1000 MCG/ML injection Inject 1,000 mcg into the muscle every 30 (thirty) days.     . diclofenac sodium (VOLTAREN) 1 % GEL APPLY 4 GMS EVERY 6 HOURS AS NEEDED FOR PAIN    . docusate sodium (COLACE) 100 MG  capsule Take 1 capsule (100 mg total) by mouth daily as needed for mild constipation. 60 capsule 3  . GLUCAGON EMERGENCY 1 MG injection INJECT 1 MG INTO THE MUSCLE ONCE AS NEEDED FOR LOW BLOOD SUGAR.  11  . Multiple Vitamins-Minerals (HM MULTIVITAMIN ADULT GUMMY) CHEW Chew 2 tablets by mouth daily.    [name] oxyCODONE-acetaminophen (PERCOCET) 5-325 MG tablet Take 1-2 tablets by mouth every 6 (six) hours as needed for severe pain. 20 tablet 0  . pantoprazole (PROTONIX) 40 MG tablet TAKE 1 TABLET (40 MG TOTAL) BY MOUTH EVERY 12 (TWELVE) HOURS.  8  . propranolol (INDERAL) 10 MG tablet Take 1 tablet (10 mg total) by mouth 3 (three) times daily as needed. 90 tablet 2  . sucralfate (CARAFATE) 1 G tablet TAKE 1 TABLET (1 G TOTAL) BY MOUTH 4 (FOUR) TIMES DAILY BEFORE MEALS AND NIGHTLY.  7  . SUMAtriptan (IMITREX) 100 MG tablet Take 100 mg by mouth every 2 (two) hours as needed for migraine or headache.     . thiamine (VITAMIN B-1) 100 MG tablet Take 100 mg by mouth.    . traMADol (ULTRAM) 50 MG tablet TAKE 50-100 MG EVERY 4 HOURS AS NEEDED FOR PAIN NO MORE THAN 8 TABS IN 24 HOURS  0  . zolpidem (AMBIEN CR) 12.5 MG CR tablet Take 1 tablet (12.5 mg total) by mouth at bedtime as needed for sleep. 30 tablet 2  . ALPRAZolam (XANAX) 0.25 MG tablet Take 1 tablet (0.25 mg total) by mouth daily as needed for anxiety. Only for severe anxiety attacks 4 tablet 0  . topiramate (TOPAMAX) 25 MG tablet Take 75 mg by mouth 2 (two) times daily. 1 tablet in the am, 2 tablets in the pm    . Vilazodone HCl (VIIBRYD) 20 MG TABS Take 1 tablet (20 mg total) by mouth daily. 30 tablet 1   No current facility-administered medications for this visit.      Musculoskeletal: Strength & Muscle Tone: within normal limits Gait & Station: normal Patient  leans: N/A  Psychiatric Specialty Exam: Review of Systems  Psychiatric/Behavioral: Positive for depression. The patient is nervous/anxious.   All other systems reviewed and are  negative.   Blood pressure 107/73, pulse 90, height 5\' 7"  (1.702 m), weight 149 lb 6.4 oz (67.8 kg), SpO2 97 %.Body mass index is 23.4 kg/m.  General Appearance: Casual  Eye Contact:  Fair  Speech:  Clear and Coherent  Volume:  Normal  Mood:  Anxious and Dysphoric  Affect:  Tearful  Thought Process:  Goal Directed and Descriptions of Associations: Intact  Orientation:  Full (Time, Place, and Person)  Thought Content: Logical   Suicidal Thoughts:  No  Homicidal Thoughts:  No  Memory:  Immediate;   Fair Recent;   Fair Remote;   Fair  Judgement:  Fair  Insight:  Fair  Psychomotor Activity:  Normal  Concentration:  Concentration: Fair and Attention Span: Fair  Recall:  [employer] of Knowledge: Fair  Language: Fair  Akathisia:  No  Handed:  Right  AIMS (if indicated): NA  Assets:  Communication Skills Desire for Improvement Social Support  ADL's:  Intact  Cognition: WNL  Sleep:  Fair   Screenings:   Assessment and Plan: Kadin is a 42 year old Caucasian female who has a history of depression, panic disorder, history of trauma, presented to the clinic today for a follow-up visit.  Patient continues to struggle with several psychosocial stressors.  She is extremely anxious about an upcoming appointment for her daughter who is going to undergo a dental procedure.  Patient continues to be motivated to stay on medications.  She also reports she would like to reach out to Ms. Miguel Dibble for psychotherapy.  Discussed plan as noted below.  Plan Depression Continue the reduced dose of Wellbutrin XL 300 mg p.o. daily. Discussed with her about the risk of lowering the seizure threshold on Wellbutrin.  She does have a history of seizures.  She however reports she is aware of that and that this was mentioned to her in the past by her other providers.  She however has been on this medication for years and would like to stay on the same dose. Patient is completely off of the Zoloft  now. Will increase Viibryd to 20 mg p.o. daily.  Discussed with patient to reach out to writer in 10 days.  For anxiety symptoms Continue propranolol 10 mg p.o. 3 times daily as needed Patient referred for psychotherapy with Ms. Miguel Dibble Will give Xanax 0.25 mg as needed for the day her daughter is going to have dental procedure.  We will only give her 4 pills. She is aware about the risk of being on benzodiazepine therapy.  Insomnia Continue Ambien CR 12.5 mg p.o. nightly.  Patient advised to call writer to discuss how she is tolerating the increased dosage of Viibryd.  She will call me back in 10 days.  Follow-up in clinic in 8 weeks.  More than 50 % of the time was spent for psychoeducation and supportive psychotherapy and care coordination.  This note was generated in part or whole with voice recognition software. Voice recognition is usually quite accurate but there are transcription errors that can and very often do occur. I apologize for any typographical errors that were not detected and corrected.         Ursula Alert, MD 08/03/2017, 9:34 AM

## 2017-08-03 ENCOUNTER — Encounter: Payer: Self-pay | Admitting: Psychiatry

## 2017-08-13 ENCOUNTER — Other Ambulatory Visit: Payer: Self-pay | Admitting: Psychiatry

## 2017-08-13 DIAGNOSIS — F331 Major depressive disorder, recurrent, moderate: Secondary | ICD-10-CM

## 2017-08-15 MED ORDER — VILAZODONE HCL 20 MG PO TABS: 30.0000 mg | ORAL_TABLET | Freq: Every day | ORAL | 1 refills | Status: DC

## 2017-08-15 NOTE — Telephone Encounter (Signed)
Pt wanted to know if she can increase the viibryd to 30mg .   Vilazodone HCl (VIIBRYD) 20 MG TABS  Medication  Date: 08/02/2017 Department: [facility] Ordering/Authorizing: Ursula Alert, MD  Order Providers   Prescribing Provider Encounter Provider  Ursula Alert, MD Ursula Alert, MD  Outpatient Medication Detail    Disp Refills Start End   Vilazodone HCl (VIIBRYD) 20 MG TABS 30 tablet 1 08/02/2017    Sig - Route: Take 1 tablet (20 mg total) by mouth daily. - Oral   Sent to pharmacy as: Vilazodone HCl (VIIBRYD) 20 MG Tab   E-Prescribing Status: Receipt confirmed by pharmacy (08/02/2017 4:11 PM EDT)

## 2017-08-15 NOTE — Telephone Encounter (Signed)
Will increase Viibryd to 30 mg  Per pt request.

## 2017-08-15 NOTE — Telephone Encounter (Signed)
pls let pt know Viibryd increased to 30 mg

## 2017-10-01 ENCOUNTER — Telehealth (HOSPITAL_COMMUNITY): Payer: Self-pay

## 2017-10-01 NOTE — Telephone Encounter (Signed)
pharmacy was faxed and confirmed the prior auth approval notice.

## 2017-10-01 NOTE — Telephone Encounter (Signed)
received fax that prior authorization for the viibryd 20mg  was approved until 10-01-2019

## 2017-10-01 NOTE — Telephone Encounter (Signed)
when online to [email or web address] and did the prior auth- pending review.

## 2017-10-01 NOTE — Telephone Encounter (Signed)
received a fax requesting a prior authorization on the viibryd 20mg 

## 2017-10-03 ENCOUNTER — Ambulatory Visit: Payer: Medicare Other | Admitting: Psychiatry

## 2017-10-10 ENCOUNTER — Ambulatory Visit (INDEPENDENT_AMBULATORY_CARE_PROVIDER_SITE_OTHER): Payer: Medicare Other | Admitting: Psychiatry

## 2017-10-10 ENCOUNTER — Other Ambulatory Visit: Payer: Self-pay

## 2017-10-10 ENCOUNTER — Encounter: Payer: Self-pay | Admitting: Psychiatry

## 2017-10-10 VITALS — BP 99/69 | HR 79 | Temp 98.5°F | Wt 150.0 lb

## 2017-10-10 DIAGNOSIS — F4310 Post-traumatic stress disorder, unspecified: Secondary | ICD-10-CM | POA: Diagnosis not present

## 2017-10-10 DIAGNOSIS — F331 Major depressive disorder, recurrent, moderate: Secondary | ICD-10-CM | POA: Diagnosis not present

## 2017-10-10 DIAGNOSIS — F410 Panic disorder [episodic paroxysmal anxiety] without agoraphobia: Secondary | ICD-10-CM

## 2017-10-10 MED ORDER — VILAZODONE HCL 20 MG PO TABS: 30.0000 mg | ORAL_TABLET | Freq: Every day | ORAL | 3 refills | Status: DC

## 2017-10-10 MED ORDER — BUPROPION HCL ER (XL) 150 MG PO TB24: 300.0000 mg | ORAL_TABLET | Freq: Every day | ORAL | 3 refills | Status: DC

## 2017-10-10 MED ORDER — ZOLPIDEM TARTRATE ER 12.5 MG PO TBCR: 12.5000 mg | EXTENDED_RELEASE_TABLET | Freq: Every evening | ORAL | 3 refills | Status: DC | PRN

## 2017-10-10 NOTE — Progress Notes (Signed)
[name]  10/10/2017 5:06 PM [name]  MRN:  [id number]  Chief Complaint: ' I am here to establish care." Chief Complaint    Follow-up; Medication Refill     HPI: [name] is a 42 year old Caucasian female, divorced, on SSD, lives in [place], has a history of depression, anxiety, presented to the clinic today for a follow-up visit.  Patient reports she continues to have some psychosocial stressors of her daughter's health problems.  She reports she also has started dealing with some worsening GI symptoms herself.  She hence has an appointment scheduled with a GI specialist soon.  Patient also has psychosocial stressors of her daughter's father who is an alcoholic and deals with his own medical problems.  Patient however reports ever since she has been started on the Viibryd she has been able to cope better with her anxiety and mood symptoms.  She denies any significant side effects from the Viibryd.  She reports she wants to stay on that dosage.  Patient reports sleep is fair on the Ambien.  Patient denies any suicidality.  Patient denies any perceptual disturbances.  Patient denies any other concerns today. Visit Diagnosis:    ICD-10-CM   1. Panic disorder F41.0 Vilazodone HCl (VIIBRYD) 20 MG TABS  2. MDD (major depressive disorder), recurrent episode, moderate (HCC) F33.1 Vilazodone HCl (VIIBRYD) 20 MG TABS    zolpidem (AMBIEN CR) 12.5 MG CR tablet    buPROPion (WELLBUTRIN XL) 150 MG 24 hr tablet  3. PTSD (post-traumatic stress disorder) F43.10 Vilazodone HCl (VIIBRYD) 20 MG TABS    Past Psychiatric History: I have reviewed past psychiatric history from my progress [name] on 05/09/2017  Past Medical History:  Past Medical History:  Diagnosis Date  . Anxiety   . Complication of anesthesia    difficulty to get sedated during endoscopy  . Depression   . Headache   . Neuropathy   . Pneumonia    within past five years  . PTSD (post-traumatic stress  disorder)   . Seizures ([place])     Past Surgical History:  Procedure Laterality Date  . ABDOMINAL HYSTERECTOMY    . BILATERAL SALPINGECTOMY Bilateral 09/29/2015   Procedure: BILATERAL SALPINGECTOMY;  Surgeon: Benjaman Kindler, MD;  Location: [facility] ORS;  Service: Gynecology;  Laterality: Bilateral;  . bowel obstruction  2011  . CHOLECYSTECTOMY    . GASTRIC BYPASS    . gi bleed  2009   Surgery-was in ICU for three weeks  . HERNIA REPAIR    . OVARIAN CYST REMOVAL Left 09/29/2015   Procedure: OVARIAN CYSTECTOMY;  Surgeon: Benjaman Kindler, MD;  Location: [facility] ORS;  Service: Gynecology;  Laterality: Left;  [name] VAGINAL HYSTERECTOMY N/A 09/29/2015   Procedure: HYSTERECTOMY VAGINAL;  Surgeon: Benjaman Kindler, MD;  Location: [facility] ORS;  Service: Gynecology;  Laterality: N/A;    Family Psychiatric History: Reviewed family psychiatric history from my progress [name] on 05/09/2017  Family History:  Family History  Problem Relation Age of Onset  . Arthritis/Rheumatoid Mother   . Heart block Mother   . Clotting disorder Mother   . Osteoporosis Mother   . Heart attack Mother   . Anxiety disorder Mother   . Depression Mother   . Heart disease Father   . Heart attack Father   . Depression Sister   . Anxiety disorder Sister     Social History: Reviewed social history from my progress [name] on 05/09/2017. Social History   Socioeconomic History  . Marital status: Single  Spouse name: Not on file  . Number of children: Not on file  . Years of education: Not on file  . Highest education level: Not on file  Occupational History  . Not on file  Social Needs  . Financial resource strain: Not on file  . Food insecurity:    Worry: Not on file    Inability: Not on file  . Transportation needs:    Medical: Not on file    Non-medical: Not on file  Tobacco Use  . Smoking status: Never Smoker  . Smokeless tobacco: Never Used  Substance and Sexual Activity  . Alcohol use: No    Alcohol/week: 0.0  standard drinks  . Drug use: No  . Sexual activity: Yes    Birth control/protection: Surgical  Lifestyle  . Physical activity:    Days per week: Not on file    Minutes per session: Not on file  . Stress: Not on file  Relationships  . Social connections:    Talks on phone: Not on file    Gets together: Not on file    Attends religious service: Not on file    Active member of club or organization: Not on file    Attends meetings of clubs or organizations: Not on file    Relationship status: Not on file  Other Topics Concern  . Not on file  Social History Narrative  . Not on file    Allergies:  Allergies  Allergen Reactions  . Amoxicillin Anaphylaxis, Hives, Shortness Of Breath, Swelling and Rash    Has patient had a PCN reaction causing immediate rash, facial/tongue/throat swelling, SOB or lightheadedness with hypotension: Yes Has patient had a PCN reaction causing severe rash involving mucus membranes or skin necrosis: Yes Has patient had a PCN reaction that required hospitalization No Has patient had a PCN reaction occurring within the last 10 years: No If all of the above answers are "NO", then may proceed with Cephalosporin use. Other reaction(s): ANAPHYLAXIS Other reaction(s): ANAPHYLAX  . Penicillins Anaphylaxis, Swelling and Rash    Has patient had a PCN reaction causing immediate rash, facial/tongue/throat swelling, SOB or lightheadedness with hypotension: Yes Has patient had a PCN reaction causing severe rash involving mucus membranes or skin necrosis: Yes Has patient had a PCN reaction that required hospitalization No Has patient had a PCN reaction occurring within the last 10 years: No If all of the above answers are "NO", then may proceed with Cephalosporin use.   [name] Morphine Other (See Comments)    Muscle spasms  . Sulfa Antibiotics     Other reaction(s): VOMITING    Metabolic Disorder Labs: Lab Results  Component Value Date   HGBA1C 4.8 03/23/2014   No  results found for: PROLACTIN No results found for: CHOL, TRIG, HDL, CHOLHDL, VLDL, LDLCALC Lab Results  Component Value Date   TSH  05/13/2009    3.163 ([name]) Please [name] change in reference ranges for ages 102W to 37Y. Test methodology is 3rd generation TSH   TSH 1.503 Test methodology is 3rd generation TSH 09/05/2008    Therapeutic Level Labs: No results found for: LITHIUM No results found for: VALPROATE No components found for:  CBMZ  Current Medications: Current Outpatient Medications  Medication Sig Dispense Refill  . acetaminophen (TYLENOL) 325 MG tablet Take 650 mg by mouth every 4 (four) hours as needed for moderate pain.     [name] ALPRAZolam (XANAX) 0.25 MG tablet Take 1 tablet (0.25 mg total) by mouth daily as  needed for anxiety. Only for severe anxiety attacks 4 tablet 0  . buPROPion (WELLBUTRIN XL) 150 MG 24 hr tablet Take 2 tablets (300 mg total) by mouth daily. 60 tablet 3  . cyanocobalamin (,VITAMIN B-12,) 1000 MCG/ML injection Inject 1,000 mcg into the muscle every 30 (thirty) days.     . diclofenac sodium (VOLTAREN) 1 % GEL APPLY 4 GMS EVERY 6 HOURS AS NEEDED FOR PAIN    . docusate sodium (COLACE) 100 MG capsule Take 1 capsule (100 mg total) by mouth daily as needed for mild constipation. 60 capsule 3  . GLUCAGON EMERGENCY 1 MG injection INJECT 1 MG INTO THE MUSCLE ONCE AS NEEDED FOR LOW BLOOD SUGAR.  11  . Multiple Vitamins-Minerals (HM MULTIVITAMIN ADULT GUMMY) CHEW Chew 2 tablets by mouth daily.    [name] oxyCODONE-acetaminophen (PERCOCET) 5-325 MG tablet Take 1-2 tablets by mouth every 6 (six) hours as needed for severe pain. 20 tablet 0  . pantoprazole (PROTONIX) 40 MG tablet TAKE 1 TABLET (40 MG TOTAL) BY MOUTH EVERY 12 (TWELVE) HOURS.  8  . propranolol (INDERAL) 10 MG tablet Take 1 tablet (10 mg total) by mouth 3 (three) times daily as needed. 90 tablet 2  . sucralfate (CARAFATE) 1 G tablet TAKE 1 TABLET (1 G TOTAL) BY MOUTH 4 (FOUR) TIMES DAILY BEFORE MEALS AND NIGHTLY.  7   . SUMAtriptan (IMITREX) 100 MG tablet Take 100 mg by mouth every 2 (two) hours as needed for migraine or headache.     . thiamine (VITAMIN B-1) 100 MG tablet Take 100 mg by mouth.    . traMADol (ULTRAM) 50 MG tablet TAKE 50-100 MG EVERY 4 HOURS AS NEEDED FOR PAIN NO MORE THAN 8 TABS IN 24 HOURS  0  . Vilazodone HCl (VIIBRYD) 20 MG TABS Take 1.5 tablets (30 mg total) by mouth daily. 45 tablet 3  . zolpidem (AMBIEN CR) 12.5 MG CR tablet Take 1 tablet (12.5 mg total) by mouth at bedtime as needed for sleep. 30 tablet 3  . topiramate (TOPAMAX) 25 MG tablet Take 75 mg by mouth 2 (two) times daily. 1 tablet in the am, 2 tablets in the pm     No current facility-administered medications for this visit.      Musculoskeletal: Strength & Muscle Tone: within normal limits Gait & Station: normal Patient leans: N/A  Psychiatric Specialty Exam: Review of Systems  Psychiatric/Behavioral: Positive for depression. The patient is nervous/anxious.   All other systems reviewed and are negative.   Blood pressure 99/69, pulse 79, temperature 98.5 F (36.9 C), temperature source Oral, weight 150 lb (68 kg).Body mass index is 23.49 kg/m.  General Appearance: Casual  Eye Contact:  Fair  Speech:  Clear and Coherent  Volume:  Normal  Mood:  Anxious and Dysphoric improving  Affect:  Congruent  Thought Process:  Goal Directed and Descriptions of Associations: Intact  Orientation:  Full (Time, Place, and Person)  Thought Content: Logical   Suicidal Thoughts:  No  Homicidal Thoughts:  No  Memory:  Immediate;   Fair Recent;   Fair Remote;   Fair  Judgement:  Fair  Insight:  Fair  Psychomotor Activity:  Normal  Concentration:  Concentration: Fair and Attention Span: Fair  Recall:  [employer] of Knowledge: Fair  Language: Fair  Akathisia:  No  Handed:  Right  AIMS (if indicated): na  Assets:  Communication Skills Desire for Improvement Housing   ADL's:  Intact  Cognition: WNL  Sleep:  Fair  Screenings:   Assessment and Plan: [name] is a 42 year old Caucasian female who has a history of depression, panic disorder, history of trauma, presented to the clinic today for a follow-up visit.  Patient continues to have psychosocial stressors including her own recent GI problems, her child's health issues, mother going through health problems, ex-husband recently diagnosed with MS and his alcoholism and so on.  Patient has not been able to follow-up with Ms. Miguel Dibble due to scheduling difficulty and not having childcare .  She however reports the medications as effective for her mood symptoms and denies any concerns today.  Plan as noted below.  Plan For depression Continue Wellbutrin XL at reduced dosage of 300 mg p.o. daily. Pt is aware about the risk of Wellbutrin lowering seizure threshold however she wants to stay on the same dosage. Continue Viibryd at 30 mg p.o. Daily.  For anxiety symptoms Continue propranolol 10 mg p.o. 3 times daily as needed  For insomnia Continue Ambien CR 12.5 mg p.o. nightly.  Patient advised to follow-up with Ms. Miguel Dibble for psychotherapy as needed.  Follow-up in clinic in 2-3 months or sooner if needed.  More than 50 % of the time was spent for psychoeducation and supportive psychotherapy and care coordination.  This [name] was generated in part or whole with voice recognition software. Voice recognition is usually quite accurate but there are transcription errors that can and very often do occur. I apologize for any typographical errors that were not detected and corrected.       Ursula Alert, MD 10/10/2017, 5:06 PM

## 2017-10-18 ENCOUNTER — Telehealth: Payer: Self-pay

## 2017-10-18 DIAGNOSIS — F331 Major depressive disorder, recurrent, moderate: Secondary | ICD-10-CM

## 2017-10-18 MED ORDER — ZOLPIDEM TARTRATE ER 12.5 MG PO TBCR: 12.5000 mg | EXTENDED_RELEASE_TABLET | Freq: Every evening | ORAL | 0 refills | Status: DC | PRN

## 2017-10-18 NOTE — Telephone Encounter (Signed)
Sent ambien to [facility] - #15

## 2017-10-18 NOTE — Telephone Encounter (Signed)
pt called left a message that she knocked over her pills of zolpidem in the sink and she is short 16 pills.  she was wondering if 15 pills could be send to [facility] she is not been able to sleep.  she will have to pay for out of pocket

## 2017-10-18 NOTE — Telephone Encounter (Signed)
Pt called and left message that this was sent into pharmacy.

## 2018-01-10 ENCOUNTER — Ambulatory Visit (INDEPENDENT_AMBULATORY_CARE_PROVIDER_SITE_OTHER): Payer: Medicare Other | Admitting: Psychiatry

## 2018-01-10 ENCOUNTER — Encounter: Payer: Self-pay | Admitting: Psychiatry

## 2018-01-10 ENCOUNTER — Ambulatory Visit: Payer: Medicare Other | Admitting: Psychiatry

## 2018-01-10 DIAGNOSIS — F4310 Post-traumatic stress disorder, unspecified: Secondary | ICD-10-CM | POA: Diagnosis not present

## 2018-01-10 DIAGNOSIS — F331 Major depressive disorder, recurrent, moderate: Secondary | ICD-10-CM | POA: Diagnosis not present

## 2018-01-10 DIAGNOSIS — F410 Panic disorder [episodic paroxysmal anxiety] without agoraphobia: Secondary | ICD-10-CM

## 2018-01-10 MED ORDER — VILAZODONE HCL 20 MG PO TABS: 30.0000 mg | ORAL_TABLET | Freq: Every day | ORAL | 3 refills | Status: DC

## 2018-01-10 MED ORDER — PROPRANOLOL HCL 10 MG PO TABS: 10.0000 mg | ORAL_TABLET | Freq: Three times a day (TID) | ORAL | 3 refills | Status: DC | PRN

## 2018-01-10 MED ORDER — BUPROPION HCL ER (XL) 150 MG PO TB24: 300.0000 mg | ORAL_TABLET | Freq: Every day | ORAL | 3 refills | Status: DC

## 2018-01-10 NOTE — Progress Notes (Signed)
[facility] MD OP Progress Note  01/10/2018 7:06 PM [name]  MRN:  [id number]  Chief Complaint: ' I am here for follow up.' Chief Complaint    Follow-up; Medication Refill     HPI: Allyse is a 42 year old Caucasian female, divorced, on SSD, lives in [place], has a history of depression, anxiety, presented to the clinic today for a follow-up visit.  Patient today reports she is currently making progress on the current medication regimen.  She reports her sadness and anxiety symptoms has improved.  She denies any crying spells.  She denies any fatigue.  She reports sleep is improved.  She reports she is tolerating the medications well.  She reports she likes the effect of Viibryd and denies any side effects to the same.  She reports her daughter who was struggling with some GI issues is currently much better and that has been a relief for her.  Patient reports she looks forward to Thanksgiving holidays.  She plans to spend it with her family.  Patient denies any suicidality or homicidality.  Patient denies any other concerns today. Visit Diagnosis:    ICD-10-CM   1. MDD (major depressive disorder), recurrent episode, moderate (HCC) F33.1 buPROPion (WELLBUTRIN XL) 150 MG 24 hr tablet    Vilazodone HCl (VIIBRYD) 20 MG TABS   in early remission  2. Panic disorder F41.0 propranolol (INDERAL) 10 MG tablet    Vilazodone HCl (VIIBRYD) 20 MG TABS   improving  3. PTSD (post-traumatic stress disorder) F43.10 Vilazodone HCl (VIIBRYD) 20 MG TABS    Past Psychiatric History: Have reviewed past psychiatric history from my progress note on 05/09/2017  Past Medical History:  Past Medical History:  Diagnosis Date  . Anxiety   . Complication of anesthesia    difficulty to get sedated during endoscopy  . Depression   . Headache   . Neuropathy   . Pneumonia    within past five years  . PTSD (post-traumatic stress disorder)   . Seizures ([place])     Past Surgical History:  Procedure  Laterality Date  . ABDOMINAL HYSTERECTOMY    . BILATERAL SALPINGECTOMY Bilateral 09/29/2015   Procedure: BILATERAL SALPINGECTOMY;  Surgeon: Benjaman Kindler, MD;  Location: [facility] ORS;  Service: Gynecology;  Laterality: Bilateral;  . bowel obstruction  2011  . CHOLECYSTECTOMY    . GASTRIC BYPASS    . gi bleed  2009   Surgery-was in ICU for three weeks  . HERNIA REPAIR    . OVARIAN CYST REMOVAL Left 09/29/2015   Procedure: OVARIAN CYSTECTOMY;  Surgeon: Benjaman Kindler, MD;  Location: [facility] ORS;  Service: Gynecology;  Laterality: Left;  [name] VAGINAL HYSTERECTOMY N/A 09/29/2015   Procedure: HYSTERECTOMY VAGINAL;  Surgeon: Benjaman Kindler, MD;  Location: [facility] ORS;  Service: Gynecology;  Laterality: N/A;    Family Psychiatric History: I have reviewed family psychiatric history from my progress note on 05/09/2017  Family History:  Family History  Problem Relation Age of Onset  . Arthritis/Rheumatoid Mother   . Heart block Mother   . Clotting disorder Mother   . Osteoporosis Mother   . Heart attack Mother   . Anxiety disorder Mother   . Depression Mother   . Heart disease Father   . Heart attack Father   . Depression Sister   . Anxiety disorder Sister     Social History: Reviewed social history from my progress note on 05/09/2017. Social History   Socioeconomic History  . Marital status: Single    Spouse name: Not  on file  . Number of children: Not on file  . Years of education: Not on file  . Highest education level: Not on file  Occupational History  . Not on file  Social Needs  . Financial resource strain: Not on file  . Food insecurity:    Worry: Not on file    Inability: Not on file  . Transportation needs:    Medical: Not on file    Non-medical: Not on file  Tobacco Use  . Smoking status: Never Smoker  . Smokeless tobacco: Never Used  Substance and Sexual Activity  . Alcohol use: No    Alcohol/week: 0.0 standard drinks  . Drug use: No  . Sexual activity: Yes    Birth  control/protection: Surgical  Lifestyle  . Physical activity:    Days per week: Not on file    Minutes per session: Not on file  . Stress: Not on file  Relationships  . Social connections:    Talks on phone: Not on file    Gets together: Not on file    Attends religious service: Not on file    Active member of club or organization: Not on file    Attends meetings of clubs or organizations: Not on file    Relationship status: Not on file  Other Topics Concern  . Not on file  Social History Narrative  . Not on file    Allergies:  Allergies  Allergen Reactions  . Amoxicillin Anaphylaxis, Hives, Shortness Of Breath, Swelling and Rash    Has patient had a PCN reaction causing immediate rash, facial/tongue/throat swelling, SOB or lightheadedness with hypotension: Yes Has patient had a PCN reaction causing severe rash involving mucus membranes or skin necrosis: Yes Has patient had a PCN reaction that required hospitalization No Has patient had a PCN reaction occurring within the last 10 years: No If all of the above answers are "NO", then may proceed with Cephalosporin use. Other reaction(s): ANAPHYLAXIS Other reaction(s): ANAPHYLAX  . Penicillins Anaphylaxis, Swelling and Rash    Has patient had a PCN reaction causing immediate rash, facial/tongue/throat swelling, SOB or lightheadedness with hypotension: Yes Has patient had a PCN reaction causing severe rash involving mucus membranes or skin necrosis: Yes Has patient had a PCN reaction that required hospitalization No Has patient had a PCN reaction occurring within the last 10 years: No If all of the above answers are "NO", then may proceed with Cephalosporin use.   [name] Morphine Other (See Comments)    Muscle spasms  . Sulfa Antibiotics     Other reaction(s): VOMITING    Metabolic Disorder Labs: Lab Results  Component Value Date   HGBA1C 4.8 03/23/2014   No results found for: PROLACTIN No results found for: CHOL, TRIG, HDL,  CHOLHDL, VLDL, LDLCALC Lab Results  Component Value Date   TSH  05/13/2009    3.163 (NOTE) Please note change in reference ranges for ages 37W to 46Y. Test methodology is 3rd generation TSH   TSH 1.503Test methodology is 3rd generation TSH 09/05/2008    Therapeutic Level Labs: No results found for: LITHIUM No results found for: VALPROATE No components found for:  CBMZ  Current Medications: Current Outpatient Medications  Medication Sig Dispense Refill  . acetaminophen (TYLENOL) 325 MG tablet Take 650 mg by mouth every 4 (four) hours as needed for moderate pain.     [name] ALPRAZolam (XANAX) 0.25 MG tablet Take 1 tablet (0.25 mg total) by mouth daily as needed for anxiety. Only  for severe anxiety attacks 4 tablet 0  . buPROPion (WELLBUTRIN XL) 150 MG 24 hr tablet Take 2 tablets (300 mg total) by mouth daily. 60 tablet 3  . clindamycin (CLEOCIN) 300 MG capsule     . cyanocobalamin (,VITAMIN B-12,) 1000 MCG/ML injection Inject 1,000 mcg into the muscle every 30 (thirty) days.     . diclofenac sodium (VOLTAREN) 1 % GEL APPLY 4 GMS EVERY 6 HOURS AS NEEDED FOR PAIN    . diphenoxylate-atropine (LOMOTIL) 2.5-0.025 MG tablet     . docusate sodium (COLACE) 100 MG capsule Take 1 capsule (100 mg total) by mouth daily as needed for mild constipation. 60 capsule 3  . fluconazole (DIFLUCAN) 150 MG tablet   0  . GLUCAGON EMERGENCY 1 MG injection INJECT 1 MG INTO THE MUSCLE ONCE AS NEEDED FOR LOW BLOOD SUGAR.  11  . metoprolol succinate (TOPROL-XL) 25 MG 24 hr tablet Take by mouth.    . Multiple Vitamins-Minerals (HM MULTIVITAMIN ADULT GUMMY) CHEW Chew 2 tablets by mouth daily.    . nitrofurantoin, macrocrystal-monohydrate, (MACROBID) 100 MG capsule     . oxyCODONE-acetaminophen (PERCOCET) 5-325 MG tablet Take 1-2 tablets by mouth every 6 (six) hours as needed for severe pain. 20 tablet 0  . pantoprazole (PROTONIX) 40 MG tablet TAKE 1 TABLET (40 MG TOTAL) BY MOUTH EVERY 12 (TWELVE) HOURS.  8  . propranolol  (INDERAL) 10 MG tablet Take 1 tablet (10 mg total) by mouth 3 (three) times daily as needed. 90 tablet 3  . sucralfate (CARAFATE) 1 G tablet TAKE 1 TABLET (1 G TOTAL) BY MOUTH 4 (FOUR) TIMES DAILY BEFORE MEALS AND NIGHTLY.  7  . SUMAtriptan (IMITREX) 100 MG tablet Take 100 mg by mouth every 2 (two) hours as needed for migraine or headache.     . thiamine (VITAMIN B-1) 100 MG tablet Take 100 mg by mouth.    . topiramate (TOPAMAX) 50 MG tablet TAKE 1 TABLET BY MOUTH IN THE MORNING, AND 2 TABLETS EVERY NIGHT AT BEDTIME    . traMADol (ULTRAM) 50 MG tablet TAKE 50-100 MG EVERY 4 HOURS AS NEEDED FOR PAIN NO MORE THAN 8 TABS IN 24 HOURS  0  . Vilazodone HCl (VIIBRYD) 20 MG TABS Take 1.5 tablets (30 mg total) by mouth daily. 45 tablet 3  . zolpidem (AMBIEN CR) 12.5 MG CR tablet Take 1 tablet (12.5 mg total) by mouth at bedtime as needed for sleep. 15 tablet 0   No current facility-administered medications for this visit.      Musculoskeletal: Strength & Muscle Tone: within normal limits Gait & Station: normal Patient leans: N/A  Psychiatric Specialty Exam: Review of Systems  Psychiatric/Behavioral: Negative for depression. The patient is not nervous/anxious.   All other systems reviewed and are negative.   Blood pressure 125/83, pulse 88, temperature 97.8 F (36.6 C), temperature source Oral, weight 146 lb (66.2 kg).Body mass index is 22.87 kg/m.  General Appearance: Casual  Eye Contact:  Fair  Speech:  Clear and Coherent  Volume:  Normal  Mood:  Anxious improving  Affect:  Congruent  Thought Process:  Goal Directed and Descriptions of Associations: Intact  Orientation:  Full (Time, Place, and Person)  Thought Content: Logical   Suicidal Thoughts:  No  Homicidal Thoughts:  No  Memory:  Immediate;   Fair Recent;   Fair Remote;   Fair  Judgement:  Fair  Insight:  Fair  Psychomotor Activity:  Normal  Concentration:  Concentration: Fair and Attention Span:  Fair  Recall:  [employer]  of Knowledge: Fair  Language: Fair  Akathisia:  No  Handed:  Right  AIMS (if indicated): denies tremors, rigidity,stiffness  Assets:  Communication Skills Desire for Improvement Social Support  ADL's:  Intact  Cognition: WNL  Sleep:  Good   Screenings:   Assessment and Plan: Elliott is a 42 year old Caucasian female who has a history of depression, panic attacks, history of trauma, presented to the clinic today for a follow-up visit.  Patient reports she has noticed improvement in her mood symptoms.  She denies any concerns today.  Will continue plan as noted below.  Plan For depression Continue Wellbutrin XL at the reduced dosage of 300 mg p.o. daily. Patient is aware about the risk of Wellbutrin lowering seizure threshold however wants to stay on the same dosage at this time. Continue Viibryd 30 mg p.o. Daily.  Anxiety symptoms Continue propanolol 10 mg p.o. 3 times daily as needed  For insomnia Ambien CR 12.5 mg p.o. nightly. I have reviewed [place] controlled substance database, patient has refills available.  Follow-up in clinic in 3 months or sooner if needed.  More than 50 % of the time was spent for psychoeducation and supportive psychotherapy and care coordination.  This note was generated in part or whole with voice recognition software. Voice recognition is usually quite accurate but there are transcription errors that can and very often do occur. I apologize for any typographical errors that were not detected and corrected.       Ursula Alert, MD 01/11/2018, 11:43 AM

## 2018-01-23 ENCOUNTER — Encounter: Payer: Self-pay | Admitting: Emergency Medicine

## 2018-01-24 ENCOUNTER — Ambulatory Visit
Admission: RE | Admit: 2018-01-24 | Discharge: 2018-01-24 | Disposition: A | Discharge status: Home / Self Care | Payer: Medicare Other | Attending: Internal Medicine | Admitting: Internal Medicine

## 2018-01-24 ENCOUNTER — Encounter
Admission: RE | Disposition: A | Discharge status: Home / Self Care | Payer: Self-pay | Source: Home / Self Care | Attending: Internal Medicine

## 2018-01-24 ENCOUNTER — Encounter: Payer: Self-pay | Admitting: *Deleted

## 2018-01-24 ENCOUNTER — Ambulatory Visit: Payer: Medicare Other | Admitting: Anesthesiology

## 2018-01-24 DIAGNOSIS — Z882 Allergy status to sulfonamides status: Secondary | ICD-10-CM | POA: Insufficient documentation

## 2018-01-24 DIAGNOSIS — M5136 Other intervertebral disc degeneration, lumbar region: Secondary | ICD-10-CM | POA: Insufficient documentation

## 2018-01-24 DIAGNOSIS — F329 Major depressive disorder, single episode, unspecified: Secondary | ICD-10-CM | POA: Diagnosis not present

## 2018-01-24 DIAGNOSIS — Z9884 Bariatric surgery status: Secondary | ICD-10-CM | POA: Diagnosis not present

## 2018-01-24 DIAGNOSIS — K640 First degree hemorrhoids: Secondary | ICD-10-CM | POA: Insufficient documentation

## 2018-01-24 DIAGNOSIS — E1140 Type 2 diabetes mellitus with diabetic neuropathy, unspecified: Secondary | ICD-10-CM | POA: Diagnosis not present

## 2018-01-24 DIAGNOSIS — K591 Functional diarrhea: Secondary | ICD-10-CM | POA: Diagnosis present

## 2018-01-24 DIAGNOSIS — Z79899 Other long term (current) drug therapy: Secondary | ICD-10-CM | POA: Diagnosis not present

## 2018-01-24 DIAGNOSIS — K219 Gastro-esophageal reflux disease without esophagitis: Secondary | ICD-10-CM | POA: Insufficient documentation

## 2018-01-24 DIAGNOSIS — F4310 Post-traumatic stress disorder, unspecified: Secondary | ICD-10-CM | POA: Diagnosis not present

## 2018-01-24 DIAGNOSIS — Z980 Intestinal bypass and anastomosis status: Secondary | ICD-10-CM | POA: Diagnosis not present

## 2018-01-24 HISTORY — DX: Abnormal electrocardiogram (ECG) (EKG): R94.31

## 2018-01-24 HISTORY — DX: Obesity, unspecified: E66.9

## 2018-01-24 HISTORY — DX: Other intervertebral disc degeneration, lumbar region without mention of lumbar back pain or lower extremity pain: M51.369

## 2018-01-24 HISTORY — PX: COLONOSCOPY WITH PROPOFOL: SHX5780

## 2018-01-24 HISTORY — DX: Unspecified abdominal pain: R10.9

## 2018-01-24 HISTORY — DX: Cardiomyopathy, unspecified: I42.9

## 2018-01-24 HISTORY — DX: Unspecified intestinal obstruction, unspecified as to partial versus complete obstruction: K56.609

## 2018-01-24 HISTORY — DX: Unspecified osteoarthritis, unspecified site: M19.90

## 2018-01-24 HISTORY — DX: Deficiency of other specified B group vitamins: E53.8

## 2018-01-24 HISTORY — DX: Iron deficiency: E61.1

## 2018-01-24 HISTORY — DX: Unspecified ovarian cyst, unspecified side: N83.209

## 2018-01-24 HISTORY — DX: Anemia, unspecified: D64.9

## 2018-01-24 HISTORY — DX: Other specified pregnancy related conditions, unspecified trimester: O26.899

## 2018-01-24 HISTORY — DX: Other chronic pain: G89.29

## 2018-01-24 HISTORY — PX: ESOPHAGOGASTRODUODENOSCOPY (EGD) WITH PROPOFOL: SHX5813

## 2018-01-24 HISTORY — DX: Cardiac arrhythmia, unspecified: I49.9

## 2018-01-24 HISTORY — DX: Other intervertebral disc degeneration, lumbar region: M51.36

## 2018-01-24 HISTORY — DX: Gastro-esophageal reflux disease without esophagitis: K21.9

## 2018-01-24 HISTORY — DX: Type 2 diabetes mellitus without complications: E11.9

## 2018-01-24 HISTORY — DX: Unspecified blood type, rh negative: Z67.91

## 2018-01-24 SURGERY — ESOPHAGOGASTRODUODENOSCOPY (EGD) WITH PROPOFOL
Anesthesia: General

## 2018-01-24 MED ORDER — FENTANYL CITRATE (PF) 100 MCG/2ML IJ SOLN
INTRAMUSCULAR | Status: DC | PRN
  Administered 2018-01-24: 100 ug via INTRAVENOUS

## 2018-01-24 MED ORDER — FENTANYL CITRATE (PF) 100 MCG/2ML IJ SOLN
INTRAMUSCULAR | Status: AC
  Filled 2018-01-24: qty 2

## 2018-01-24 MED ORDER — PROPOFOL 500 MG/50ML IV EMUL
INTRAVENOUS | Status: AC
  Filled 2018-01-24: qty 50

## 2018-01-24 MED ORDER — PROPOFOL 10 MG/ML IV BOLUS
INTRAVENOUS | Status: DC | PRN
  Administered 2018-01-24: 100 mg via INTRAVENOUS

## 2018-01-24 MED ORDER — MIDAZOLAM HCL 5 MG/5ML IJ SOLN
INTRAMUSCULAR | Status: DC | PRN
  Administered 2018-01-24 (×2): 1 mg via INTRAVENOUS

## 2018-01-24 MED ORDER — PHENYLEPHRINE HCL 10 MG/ML IJ SOLN
INTRAMUSCULAR | Status: DC | PRN
  Administered 2018-01-24: 100 ug via INTRAVENOUS

## 2018-01-24 MED ORDER — PROPOFOL 500 MG/50ML IV EMUL
INTRAVENOUS | Status: DC | PRN
  Administered 2018-01-24: 200 ug/kg/min via INTRAVENOUS

## 2018-01-24 MED ORDER — PROPOFOL 10 MG/ML IV BOLUS
INTRAVENOUS | Status: AC
  Filled 2018-01-24: qty 20

## 2018-01-24 MED ORDER — MIDAZOLAM HCL 2 MG/2ML IJ SOLN
INTRAMUSCULAR | Status: AC
  Filled 2018-01-24: qty 2

## 2018-01-24 MED ORDER — LIDOCAINE 2% (20 MG/ML) 5 ML SYRINGE
INTRAMUSCULAR | Status: DC | PRN
  Administered 2018-01-24: 30 mg via INTRAVENOUS

## 2018-01-24 MED ORDER — SODIUM CHLORIDE 0.9 % IV SOLN
INTRAVENOUS | Status: DC
  Administered 2018-01-24: 1000 mL via INTRAVENOUS

## 2018-01-24 NOTE — Anesthesia Preprocedure Evaluation (Signed)
Anesthesia Evaluation  Patient identified by MRN, date of birth, ID band Patient awake    Reviewed: Allergy & Precautions, H&P , NPO status , Patient's Chart, lab work & pertinent test results  History of Anesthesia Complications (+) history of anesthetic complications  Airway Mallampati: II  TM Distance: >3 FB Neck ROM: full    Dental  (+) Chipped   Pulmonary pneumonia,           Cardiovascular + dysrhythmias      Neuro/Psych  Headaches, Seizures -, Well Controlled,  PSYCHIATRIC DISORDERS    GI/Hepatic Neg liver ROS, GERD  Medicated and Controlled,  Endo/Other  diabetes, Type 2  Renal/GU negative Renal ROS  negative genitourinary   Musculoskeletal  (+) Arthritis ,   Abdominal   Peds  Hematology negative hematology ROS (+)   Anesthesia Other Findings Past Medical History: No date: Abnormal ECG No date: Anemia No date: Anxiety No date: Arthritis No date: B12 deficiency No date: Cardiomyopathy ([place]) No date: Chronic abdominal pain No date: Complication of anesthesia     Comment:  difficulty to get sedated during endoscopy No date: DDD (degenerative disc disease), lumbar No date: Depression No date: Diabetes mellitus without complication (HCC) No date: Dysrhythmia No date: GERD (gastroesophageal reflux disease) No date: Headache No date: Iron deficiency No date: Neuropathy No date: Obesity No date: Ovarian cyst No date: Pneumonia     Comment:  within past five years No date: PTSD (post-traumatic stress disorder) No date: PTSD (post-traumatic stress disorder) No date: Rh negative status during pregnancy No date: Seizures ([place]) No date: Small bowel obstruction ([place])  Past Surgical History: No date: ABDOMINAL HYSTERECTOMY 09/29/2015: BILATERAL SALPINGECTOMY; Bilateral     Comment:  Procedure: BILATERAL SALPINGECTOMY;  Surgeon: Benjaman Kindler, MD;  Location: [facility] ORS;  Service:  Gynecology;                Laterality: Bilateral; 2011: bowel obstruction No date: CHOLECYSTECTOMY No date: DILATION AND CURETTAGE OF UTERUS No date: ESOPHAGOGASTRODUODENOSCOPY No date: 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     Comment:  Surgery-was in ICU for three weeks No date: HERNIA REPAIR 09/29/2015: OVARIAN CYST REMOVAL; Left     Comment:  Procedure: OVARIAN CYSTECTOMY;  Surgeon: Benjaman Kindler, MD;  Location: [facility] ORS;  Service: Gynecology;                Laterality: Left; No date: ROUX-EN-Y PROCEDURE 09/29/2015: VAGINAL HYSTERECTOMY; N/A     Comment:  Procedure: HYSTERECTOMY VAGINAL;  Surgeon: Benjaman Kindler, MD;  Location: [facility] ORS;  Service: Gynecology;                Laterality: N/A;  BMI    Body Mass Index:  22.40 kg/m      Reproductive/Obstetrics negative OB ROS                             Anesthesia Physical Anesthesia Plan  ASA: III  Anesthesia Plan: General   Post-op Pain Management:    Induction: Intravenous  PONV Risk Score and Plan: Propofol infusion and TIVA  Airway Management Planned: Natural Airway and Nasal Cannula  Additional Equipment:   Intra-op Plan:   Post-operative Plan:   Informed Consent:  I have reviewed the patients History and Physical, chart, labs and discussed the procedure including the risks, benefits and alternatives for the proposed anesthesia with the patient or authorized representative who has indicated his/her understanding and acceptance.   Dental Advisory Given  Plan Discussed with: Anesthesiologist, CRNA and Surgeon  Anesthesia Plan Comments: (Patient consented for risks of anesthesia including but not limited to:  - adverse reactions to medications - risk of intubation if required - damage to teeth, lips or other oral mucosa - sore throat or hoarseness - Damage to heart, brain, lungs or loss of life  Patient voiced understanding.)        Anesthesia Quick  Evaluation

## 2018-01-24 NOTE — Anesthesia Postprocedure Evaluation (Signed)
Anesthesia Post Note  Patient: [name]  Procedure(s) Performed: ESOPHAGOGASTRODUODENOSCOPY (EGD) WITH PROPOFOL (N/A ) COLONOSCOPY WITH PROPOFOL (N/A )  Patient location during evaluation: Endoscopy Anesthesia Type: General Level of consciousness: awake and alert Pain management: pain level controlled Vital Signs Assessment: post-procedure vital signs reviewed and stable Respiratory status: spontaneous breathing, nonlabored ventilation, respiratory function stable and patient connected to nasal cannula oxygen Cardiovascular status: blood pressure returned to baseline and stable Postop Assessment: no apparent nausea or vomiting Anesthetic complications: no     Last Vitals:  Vitals:   01/24/18 1140 01/24/18 1150  BP:  98/65  Pulse: 76 65  Resp: 13 15  Temp:    SpO2: 100% 100%    Last Pain:  Vitals:   01/24/18 0851  TempSrc: Tympanic  PainSc: 0-No pain                 Precious Haws Piscitello

## 2018-01-24 NOTE — Anesthesia Post-op Follow-up Note (Signed)
Anesthesia QCDR form completed.        

## 2018-01-24 NOTE — Op Note (Signed)
[facility] Gastroenterology Patient Name: [name] Procedure Date: 01/24/2018 10:33 AM MRN: [id number] Account #: [id number] Date of Birth: 1975-12-14 Admit Type: Outpatient Age: 42 Room: [facility] ENDO ROOM 2 Gender: Female Note Status: Finalized Procedure:            Upper GI endoscopy Indications:          Lower abdominal pain, Esophageal reflux Providers:            Benay Pike. Alice Reichert MD, MD Referring MD:         Leonard Downing (Referring MD) Medicines:            Propofol per Anesthesia Complications:        No immediate complications. Procedure:            Pre-Anesthesia Assessment:                       - The risks and benefits of the procedure and the                        sedation options and risks were discussed with the                        patient. All questions were answered and informed                        consent was obtained.                       - Patient identification and proposed procedure were                        verified prior to the procedure by the nurse. The                        procedure was verified in the procedure room.                       - ASA Grade Assessment: II - A patient with mild                        systemic disease.                       - After reviewing the risks and benefits, the patient                        was deemed in satisfactory condition to undergo the                        procedure.                       After obtaining informed consent, the endoscope was                        passed under direct vision. Throughout the procedure,                        the patient's blood pressure, pulse, and oxygen  saturations were monitored continuously. The Endoscope                        was introduced through the mouth, and advanced to the                        efferent jejunal loop. The upper GI endoscopy was                        accomplished without difficulty. The  patient tolerated                        the procedure well. Findings:      The examined esophagus was normal.      Evidence of a Roux-en-Y gastrojejunostomy was found. The gastrojejunal       anastomosis was characterized by healthy appearing mucosa. This was       traversed. The pouch-to-jejunum limb was characterized by healthy       appearing mucosa. The duodenum-to-jejunum limb was not examined as it       could not be found.      The examined jejunum was normal. Biopsies for histology were taken with       a cold forceps for evaluation of celiac disease.      The exam was otherwise without abnormality. Impression:           - Normal esophagus.                       - Roux-en-Y gastrojejunostomy with gastrojejunal                        anastomosis characterized by healthy appearing mucosa.                       - Normal examined jejunum. Biopsied.                       - The examination was otherwise normal. Recommendation:       - Await pathology results.                       - Proceed with colonoscopy Procedure Code(s):    --- Professional ---                       [id number], Esophagogastroduodenoscopy, flexible, transoral;                        with biopsy, single or multiple Diagnosis Code(s):    --- Professional ---                       K21.9, Gastro-esophageal reflux disease without                        esophagitis                       R10.30, Lower abdominal pain, unspecified                       Z98.0, Intestinal bypass and anastomosis status CPT copyright 2018 American Medical Association. All rights reserved. The codes documented in this report are  preliminary and upon coder review may  be revised to meet current compliance requirements. Efrain Sella MD, MD 01/24/2018 10:45:44 AM This report has been signed electronically. Number of Addenda: 0 Note Initiated On: 01/24/2018 10:33 AM      [facility]

## 2018-01-24 NOTE — H&P (Signed)
Outpatient short stay form Pre-procedure 01/24/2018 10:30 AM [name] K. [name], M.D.  Primary Physician: Claris Gower, MD  Reason for visit: GERD, abdominal pain, fecal incontinence, diarrhea  History of present illness: 42 year old female complains of fecal incontinence and loose stools for the last 8 months.  No known sick contacts.  Has had 2 previous episiotomies after vaginal delivery but denies that there were any "severe tears".  Patient has a several year history of GERD controlled with pantoprazole as needed, but more often the patient takes chewable Alka-Seltzer to relieve symptoms.  No dysphagia, hematemesis, hematochezia or melena.  Patient is status post Roux-en-Y gastrojejunostomy for weight loss    Current Facility-Administered Medications:  .  0.9 %  sodium chloride infusion, , Intravenous, Continuous, [place], Benay Pike, MD, Last Rate: 20 mL/hr at 01/24/18 0911, 1,000 mL at 01/24/18 0911  Medications Prior to Admission  Medication Sig Dispense Refill Last Dose  . buPROPion (WELLBUTRIN XL) 150 MG 24 hr tablet Take 2 tablets (300 mg total) by mouth daily. 60 tablet 3 01/24/2018 at Unknown time  . metoprolol succinate (TOPROL-XL) 25 MG 24 hr tablet Take by mouth.   01/23/2018 at Unknown time  . acetaminophen (TYLENOL) 325 MG tablet Take 650 mg by mouth every 4 (four) hours as needed for moderate pain.    Taking  . cyanocobalamin (,VITAMIN B-12,) 1000 MCG/ML injection Inject 1,000 mcg into the muscle every 30 (thirty) days.    Taking  . diphenoxylate-atropine (LOMOTIL) 2.5-0.025 MG tablet    Taking  . Multiple Vitamins-Minerals (HM MULTIVITAMIN ADULT GUMMY) CHEW Chew 2 tablets by mouth daily.   Taking  . SUMAtriptan (IMITREX) 100 MG tablet Take 100 mg by mouth every 2 (two) hours as needed for migraine or headache.    Taking  . thiamine (VITAMIN B-1) 100 MG tablet Take 100 mg by mouth.   Taking  . topiramate (TOPAMAX) 50 MG tablet TAKE 1 TABLET BY MOUTH IN THE MORNING, AND 2  TABLETS EVERY NIGHT AT BEDTIME   Taking  . traMADol (ULTRAM) 50 MG tablet TAKE 50-100 MG EVERY 4 HOURS AS NEEDED FOR PAIN NO MORE THAN 8 TABS IN 24 HOURS  0 Taking  . Vilazodone HCl (VIIBRYD) 20 MG TABS Take 1.5 tablets (30 mg total) by mouth daily. 45 tablet 3   . zolpidem (AMBIEN CR) 12.5 MG CR tablet Take 1 tablet (12.5 mg total) by mouth at bedtime as needed for sleep. 15 tablet 0 Taking     Allergies  Allergen Reactions  . Amoxicillin Anaphylaxis, Hives, Shortness Of Breath, Swelling and Rash    Has patient had a PCN reaction causing immediate rash, facial/tongue/throat swelling, SOB or lightheadedness with hypotension: Yes Has patient had a PCN reaction causing severe rash involving mucus membranes or skin necrosis: Yes Has patient had a PCN reaction that required hospitalization No Has patient had a PCN reaction occurring within the last 10 years: No If all of the above answers are "NO", then may proceed with Cephalosporin use. Other reaction(s): ANAPHYLAXIS Other reaction(s): ANAPHYLAX  . Penicillins Anaphylaxis, Swelling and Rash    Has patient had a PCN reaction causing immediate rash, facial/tongue/throat swelling, SOB or lightheadedness with hypotension: Yes Has patient had a PCN reaction causing severe rash involving mucus membranes or skin necrosis: Yes Has patient had a PCN reaction that required hospitalization No Has patient had a PCN reaction occurring within the last 10 years: No If all of the above answers are "NO", then may proceed with Cephalosporin use.   [name]  Morphine Other (See Comments)    Muscle spasms  . Sulfa Antibiotics     Other reaction(s): VOMITING     Past Medical History:  Diagnosis Date  . Abnormal ECG   . Anemia   . Anxiety   . Arthritis   . B12 deficiency   . Cardiomyopathy ([place])   . Chronic abdominal pain   . Complication of anesthesia    difficulty to get sedated during endoscopy  . DDD (degenerative disc disease), lumbar   . Depression    . Diabetes mellitus without complication ([place])   . Dysrhythmia   . GERD (gastroesophageal reflux disease)   . Headache   . Iron deficiency   . Neuropathy   . Obesity   . Ovarian cyst   . Pneumonia    within past five years  . PTSD (post-traumatic stress disorder)   . PTSD (post-traumatic stress disorder)   . Rh negative status during pregnancy   . Seizures ([place])   . Small bowel obstruction ([place])     Review of systems:  Otherwise negative.    Physical Exam  Gen: Alert, oriented. Appears stated age.  HEENT: [place]/AT. PERRLA. Lungs: CTA, no wheezes. CV: RR nl S1, S2. Abd: soft, benign, no masses. BS+ Ext: No edema. Pulses 2+    Planned procedures: Proceed with EGD and colonoscopy. The patient understands the nature of the planned procedure, indications, risks, alternatives and potential complications including but not limited to bleeding, infection, perforation, damage to internal organs and possible oversedation/side effects from anesthesia. The patient agrees and gives consent to proceed.  Please refer to procedure notes for findings, recommendations and patient disposition/instructions.     Wrigley Plasencia K. [name], M.D. Gastroenterology 01/24/2018  10:30 AM

## 2018-01-24 NOTE — Transfer of Care (Signed)
Immediate Anesthesia Transfer of Care Note  Patient: NYESHIA MYSLIWIEC  Procedure(s) Performed: ESOPHAGOGASTRODUODENOSCOPY (EGD) WITH PROPOFOL (N/A ) COLONOSCOPY WITH PROPOFOL (N/A )  Patient Location: PACU and Endoscopy Unit  Anesthesia Type:General  Level of Consciousness: sedated  Airway & Oxygen Therapy: Patient Spontanous Breathing and Patient connected to nasal cannula oxygen  Post-op Assessment: Report given to RN and Post -op Vital signs reviewed and stable  Post vital signs: Reviewed and stable  Last Vitals:  Vitals Value Taken Time  BP 109/74 01/24/2018 11:12 AM  Temp    Pulse 66 01/24/2018 11:14 AM  Resp 13 01/24/2018 11:14 AM  SpO2 100 % 01/24/2018 11:14 AM  Vitals shown include unvalidated device data.  Last Pain:  Vitals:   01/24/18 0851  TempSrc: Tympanic  PainSc: 0-No pain         Complications: No apparent anesthesia complications

## 2018-01-24 NOTE — Op Note (Signed)
[facility] Gastroenterology Patient Name: [name] Procedure Date: 01/24/2018 10:32 AM MRN: [id number] Account #: [id number] Date of Birth: [birth year] Admit Type: Outpatient Age: 42 Room: [facility] ENDO ROOM 2 Gender: Female Note Status: Finalized Procedure:            Colonoscopy Indications:          Functional diarrhea, Fecal incontinence Providers:            Benay Pike. Alice Reichert MD, MD Referring MD:         Leonard Downing (Referring MD) Medicines:            Propofol per Anesthesia Complications:        No immediate complications. Procedure:            Pre-Anesthesia Assessment:                       - The risks and benefits of the procedure and the                        sedation options and risks were discussed with the                        patient. All questions were answered and informed                        consent was obtained.                       - Patient identification and proposed procedure were                        verified prior to the procedure by the nurse. The                        procedure was verified in the procedure room.                       - ASA Grade Assessment: III - A patient with severe                        systemic disease.                       - After reviewing the risks and benefits, the patient                        was deemed in satisfactory condition to undergo the                        procedure.                       After obtaining informed consent, the colonoscope was                        passed under direct vision. Throughout the procedure,                        the patient's blood pressure, pulse, and oxygen  saturations were monitored continuously. The                        Colonoscope was introduced through the anus and                        advanced to the the cecum, identified by appendiceal                        orifice and ileocecal valve. The colonoscopy was                         performed without difficulty. The patient tolerated the                        procedure well. The quality of the bowel preparation                        was good. The ileocecal valve, appendiceal orifice, and                        rectum were photographed. The colonoscopy was somewhat                        difficult due to a redundant colon. Successful                        completion of the procedure was aided by applying                        abdominal pressure. Findings:      The perianal and digital rectal examinations were normal. Pertinent       negatives include normal sphincter tone and no palpable rectal lesions.      The colon (entire examined portion) was mildly tortuous.      Normal mucosa was found in the entire colon. Biopsies for histology were       taken with a cold forceps from the random colon for evaluation of       microscopic colitis.      Non-bleeding internal hemorrhoids were found during retroflexion. The       hemorrhoids were Grade I (internal hemorrhoids that do not prolapse).      The exam was otherwise without abnormality. Impression:           - Tortuous colon.                       - Normal mucosa in the entire examined colon. Biopsied.                       - Non-bleeding internal hemorrhoids.                       - The examination was otherwise normal. Recommendation:       - Patient has a contact number available for                        emergencies. The signs and symptoms of potential                        delayed  complications were discussed with the patient.                        Return to normal activities tomorrow. Written discharge                        instructions were provided to the patient.                       - Resume previous diet.                       - Continue present medications.                       - Await pathology results.                       - Await pathology results from EGD, also performed                         today.                       - Repeat colonoscopy in 10 years for screening purposes.                       - Return to physician assistant in 3 months.                       - Use Protonix (pantoprazole) 40 mg PO daily.                       - The findings and recommendations were discussed with                        the patient and their family. Procedure Code(s):    --- Professional ---                       [phone], Colonoscopy, flexible; with biopsy, single or                        multiple Diagnosis Code(s):    --- Professional ---                       R15.9, Full incontinence of feces                       Q43.8, Other specified congenital malformations of                        intestine                       K59.1, Functional diarrhea                       K64.0, First degree hemorrhoids CPT copyright 2018 American Medical Association. All rights reserved. The codes documented in this report are preliminary and upon coder review may  be revised to meet current compliance requirements. Efrain Sella MD, MD 01/24/2018 11:10:13 AM This report has been signed electronically. Number of Addenda: 0 Note Initiated On: 01/24/2018 10:32 AM  Scope Withdrawal Time: 0 hours 6 minutes 53 seconds  Total Procedure Duration: 0 hours 17 minutes 37 seconds       [facility]

## 2018-01-24 NOTE — Interval H&P Note (Signed)
History and Physical Interval Note:  01/24/2018 10:32 AM  Athena Masse  has presented today for surgery, with the diagnosis of Full incontinence, GERD  The various methods of treatment have been discussed with the patient and family. After consideration of risks, benefits and other options for treatment, the patient has consented to  Procedure(s): ESOPHAGOGASTRODUODENOSCOPY (EGD) WITH PROPOFOL (N/A) COLONOSCOPY WITH PROPOFOL (N/A) as a surgical intervention .  The patient's history has been reviewed, patient examined, no change in status, stable for surgery.  I have reviewed the patient's chart and labs.  Questions were answered to the patient's satisfaction.     [place], [place]

## 2018-01-25 ENCOUNTER — Encounter: Payer: Self-pay | Admitting: Internal Medicine

## 2018-01-25 LAB — SURGICAL PATHOLOGY

## 2018-03-19 ENCOUNTER — Other Ambulatory Visit: Payer: Self-pay | Admitting: Psychiatry

## 2018-03-19 DIAGNOSIS — F331 Major depressive disorder, recurrent, moderate: Secondary | ICD-10-CM

## 2018-04-12 ENCOUNTER — Encounter: Payer: Self-pay | Admitting: Psychiatry

## 2018-04-12 ENCOUNTER — Ambulatory Visit (INDEPENDENT_AMBULATORY_CARE_PROVIDER_SITE_OTHER): Payer: Medicare Other | Admitting: Psychiatry

## 2018-04-12 VITALS — BP 109/75 | HR 75 | Ht 67.5 in | Wt 154.0 lb

## 2018-04-12 DIAGNOSIS — F4310 Post-traumatic stress disorder, unspecified: Secondary | ICD-10-CM

## 2018-04-12 DIAGNOSIS — F410 Panic disorder [episodic paroxysmal anxiety] without agoraphobia: Secondary | ICD-10-CM | POA: Diagnosis not present

## 2018-04-12 DIAGNOSIS — F5105 Insomnia due to other mental disorder: Secondary | ICD-10-CM | POA: Diagnosis not present

## 2018-04-12 DIAGNOSIS — F331 Major depressive disorder, recurrent, moderate: Secondary | ICD-10-CM

## 2018-04-12 NOTE — Progress Notes (Signed)
[place] MD  OP Progress Note  04/12/2018 4:43 PM [name]  MRN:  [id number]  Chief Complaint: ' I am here for follow up.' Chief Complaint    Follow-up     HPI: [name] is a 43 yr old Caucasian female, divorced, on SSD, lives in [place], has a history of depression, anxiety, presented to clinic today for a follow-up visit.  Patient today reports she is making progress on the current medication regimen.  She denies any significant anxiety symptoms or sadness.  She is tolerating the Wellbutrin and the Viibryd well.  She denies any side effects.  She reports she is starting pain medication infusion next week.  She reports she looks forward to that.  She was tried on tramadol previously however she reports she was asked not to take the Ambien along with the tramadol.  She tried going off of the Ambien however it did not work.  She reports she went without sleep for a few days and decided to get back on her medication again.  She hence decided to get off of the tramadol and is going to get lidocaine infusion for her pain with pain management.  Discussed with patient about letter received from her health insurance plan regarding Ambien not covered.  Patient reports she has tried multiple other medication with previous psychiatrist.  She reports she has tried medications like Lunesta, trazodone, temazepam, Sonata previously.  None of those medications worked for her.  Patient also cannot have medications in the class of tricyclic antidepressants due to her history of seizure disorder.  Patient reports Ambien is the only medication that has worked for her sleep and she is at a better place with her mood right now.  She does not want to disrupt that by changing her sleep medications again.  Patient denies any suicidality homicidality or perceptual disturbances.     Visit Diagnosis:    ICD-10-CM   1. MDD (major depressive disorder), recurrent episode, moderate (HCC) F33.1   2. Panic disorder  F41.0   3. PTSD (post-traumatic stress disorder) F43.10   4. Insomnia due to mental condition F51.05     Past Psychiatric History: Reviewed past psychiatric history from my progress note on 05/09/2017  Past Medical History:  Past Medical History:  Diagnosis Date  . Abnormal ECG   . Anemia   . Anxiety   . Arthritis   . B12 deficiency   . Cardiomyopathy ([place])   . Chronic abdominal pain   . Complication of anesthesia    difficulty to get sedated during endoscopy  . DDD (degenerative disc disease), lumbar   . Depression   . Diabetes mellitus without complication ([place])   . Dysrhythmia   . GERD (gastroesophageal reflux disease)   . Headache   . Iron deficiency   . Neuropathy   . Obesity   . Ovarian cyst   . Pneumonia    within past five years  . PTSD (post-traumatic stress disorder)   . PTSD (post-traumatic stress disorder)   . Rh negative status during pregnancy   . Seizures ([place])   . Small bowel obstruction [facility])     Past Surgical History:  Procedure Laterality Date  . ABDOMINAL HYSTERECTOMY    . BILATERAL SALPINGECTOMY Bilateral 09/29/2015   Procedure: BILATERAL SALPINGECTOMY;  Surgeon: Benjaman Kindler, MD;  Location: [facility] ORS;  Service: Gynecology;  Laterality: Bilateral;  . bowel obstruction  2011  . CHOLECYSTECTOMY    . COLONOSCOPY WITH PROPOFOL N/A 01/24/2018   Procedure: COLONOSCOPY  WITH PROPOFOL;  Surgeon: Toledo, Benay Pike, MD;  Location: [facility] ENDOSCOPY;  Service: Gastroenterology;  Laterality: N/A;  . DILATION AND CURETTAGE OF UTERUS    . ESOPHAGOGASTRODUODENOSCOPY    . ESOPHAGOGASTRODUODENOSCOPY (EGD) WITH PROPOFOL N/A 01/24/2018   Procedure: ESOPHAGOGASTRODUODENOSCOPY (EGD) WITH PROPOFOL;  Surgeon: Toledo, Benay Pike, MD;  Location: [facility] ENDOSCOPY;  Service: Gastroenterology;  Laterality: N/A;  . GASTRIC BYPASS    . gi bleed  2009   Surgery-was in ICU for three weeks  . HERNIA REPAIR    . OVARIAN CYST REMOVAL Left 09/29/2015   Procedure: OVARIAN CYSTECTOMY;   Surgeon: Benjaman Kindler, MD;  Location: [facility] ORS;  Service: Gynecology;  Laterality: Left;  . ROUX-EN-Y PROCEDURE    . VAGINAL HYSTERECTOMY N/A 09/29/2015   Procedure: HYSTERECTOMY VAGINAL;  Surgeon: Benjaman Kindler, MD;  Location: [facility] ORS;  Service: Gynecology;  Laterality: N/A;    Family Psychiatric History: Reviewed family psychiatric history from my progress note on 05/09/2017  Family History:  Family History  Problem Relation Age of Onset  . Arthritis/Rheumatoid Mother   . Heart block Mother   . Clotting disorder Mother   . Osteoporosis Mother   . Heart attack Mother   . Anxiety disorder Mother   . Depression Mother   . Heart disease Father   . Heart attack Father   . Depression Sister   . Anxiety disorder Sister     Social History: Reviewed social history from my progress note on 05/10/2027 Social History   Socioeconomic History  . Marital status: Single    Spouse name: Not on file  . Number of children: Not on file  . Years of education: Not on file  . Highest education level: Not on file  Occupational History  . Not on file  Social Needs  . Financial resource strain: Not on file  . Food insecurity:    Worry: Not on file    Inability: Not on file  . Transportation needs:    Medical: Not on file    Non-medical: Not on file  Tobacco Use  . Smoking status: Never Smoker  . Smokeless tobacco: Never Used  Substance and Sexual Activity  . Alcohol use: No    Alcohol/week: 0.0 standard drinks  . Drug use: No  . Sexual activity: Yes    Partners: Male    Birth control/protection: Surgical  Lifestyle  . Physical activity:    Days per week: Not on file    Minutes per session: Not on file  . Stress: Not on file  Relationships  . Social connections:    Talks on phone: Not on file    Gets together: Not on file    Attends religious service: Not on file    Active member of club or organization: Not on file    Attends meetings of clubs or organizations: Not on file     Relationship status: Not on file  Other Topics Concern  . Not on file  Social History Narrative  . Not on file    Allergies:  Allergies  Allergen Reactions  . Amoxicillin Anaphylaxis, Hives, Shortness Of Breath, Swelling and Rash    Has patient had a PCN reaction causing immediate rash, facial/tongue/throat swelling, SOB or lightheadedness with hypotension: Yes Has patient had a PCN reaction causing severe rash involving mucus membranes or skin necrosis: Yes Has patient had a PCN reaction that required hospitalization No Has patient had a PCN reaction occurring within the last 10 years: No If all of the  above answers are "NO", then may proceed with Cephalosporin use. Other reaction(s): ANAPHYLAXIS Other reaction(s): ANAPHYLAX  . Penicillins Anaphylaxis, Swelling and Rash    Has patient had a PCN reaction causing immediate rash, facial/tongue/throat swelling, SOB or lightheadedness with hypotension: Yes Has patient had a PCN reaction causing severe rash involving mucus membranes or skin necrosis: Yes Has patient had a PCN reaction that required hospitalization No Has patient had a PCN reaction occurring within the last 10 years: No If all of the above answers are "NO", then may proceed with Cephalosporin use.   [name] Morphine Other (See Comments)    Muscle spasms  . Sulfa Antibiotics     Other reaction(s): VOMITING    Metabolic Disorder Labs: Lab Results  Component Value Date   HGBA1C 4.8 03/23/2014   No results found for: PROLACTIN No results found for: CHOL, TRIG, HDL, CHOLHDL, VLDL, LDLCALC Lab Results  Component Value Date   TSH  05/13/2009    3.163 (NOTE)  Please note change in reference ranges for ages 53W to 61Y. Test methodology is 3rd generation TSH   TSH 1.503 Test methodology is 3rd generation TSH 09/05/2008    Therapeutic Level Labs: No results found for: LITHIUM No results found for: VALPROATE No components found for:  CBMZ  Current Medications: Current  Outpatient Medications  Medication Sig Dispense Refill  . acetaminophen (TYLENOL) 325 MG tablet Take 650 mg by mouth every 4 (four) hours as needed for moderate pain.     [name] buPROPion (WELLBUTRIN XL) 150 MG 24 hr tablet Take 2 tablets (300 mg total) by mouth daily. 60 tablet 3  . cyanocobalamin (,VITAMIN B-12,) 1000 MCG/ML injection Inject 1,000 mcg into the muscle every 30 (thirty) days.     . diphenoxylate-atropine (LOMOTIL) 2.5-0.025 MG tablet     . metoprolol succinate (TOPROL-XL) 25 MG 24 hr tablet Take by mouth.    . Multiple Vitamins-Minerals (HM MULTIVITAMIN ADULT GUMMY) CHEW Chew 2 tablets by mouth daily.    . SUMAtriptan (IMITREX) 100 MG tablet Take 100 mg by mouth every 2 (two) hours as needed for migraine or headache.     . topiramate (TOPAMAX) 50 MG tablet TAKE 1 TABLET BY MOUTH IN THE MORNING, AND 2 TABLETS EVERY NIGHT AT BEDTIME    . traMADol (ULTRAM) 50 MG tablet TAKE 50-100 MG EVERY 4 HOURS AS NEEDED FOR PAIN NO MORE THAN 8 TABS IN 24 HOURS  0  . Vilazodone HCl (VIIBRYD) 20 MG TABS Take 1.5 tablets (30 mg total) by mouth daily. 45 tablet 3  . zolpidem (AMBIEN CR) 12.5 MG CR tablet TAKE 1 TABLET(12.5 MG) BY MOUTH AT BEDTIME AS NEEDED FOR SLEEP 30 tablet 2  . thiamine (VITAMIN B-1) 100 MG tablet Take 100 mg by mouth.     No current facility-administered medications for this visit.      Musculoskeletal: Strength & Muscle Tone: within normal limits Gait & Station: normal Patient leans: N/A  Psychiatric Specialty Exam: Review of Systems  Psychiatric/Behavioral: Negative for depression. The patient is not nervous/anxious.   All other systems reviewed and are negative.   Blood pressure 109/75, pulse 75, height 5' 7.5" (1.715 m), weight 154 lb (69.9 kg).Body mass index is 23.76 kg/m.  General Appearance: Casual  Eye Contact:  Fair  Speech:  Normal Rate  Volume:  Normal  Mood:  Euthymic  Affect:  Congruent  Thought Process:  Goal Directed and Descriptions of Associations:  Intact  Orientation:  Full (Time, Place, and Person)  Thought Content: Logical   Suicidal Thoughts:  No  Homicidal Thoughts:  No  Memory:  Immediate;   Fair Recent;   Fair Remote;   Fair  Judgement:  Fair  Insight:  Fair  Psychomotor Activity:  Normal  Concentration:  Concentration: Fair and Attention Span: Fair  Recall:  [employer] of Knowledge: Fair  Language: Fair  Akathisia:  No  Handed:  Right  AIMS (if indicated): denies tremors, rigidity,stiffness  Assets:  Agricultural consultant Social Support  ADL's:  Intact  Cognition: WNL  Sleep:  Fair   Screenings:   Assessment and Plan: Sylwia is a 43 year old Caucasian female who has a history of depression, panic attacks, history of trauma, presented to clinic today for a follow-up visit.  Patient continues to make progress.  Patient reports mood symptoms and sleep are stable.  Will continue plan as noted below.  Plan For depression-stable Continue Wellbutrin XL at the reduced dose to 300 mg p.o. daily Patient is aware about the risk of Wellbutrin lowering seizure threshold however wants to stay on the same dosage at this time. Viibryd 30 mg p.o. daily  For anxiety symptoms-stable Propranolol 10 mg 3 times daily as needed  For insomnia-stable Ambien CR 12.5 mg Discussed with patient that that we can try to request to make an exception about her sleep medication since we received a letter regarding her Ambien not being covered.  Patient has failed multiple sleep medications previously like trazodone, Lunesta, temazepam, Sonata and so on.  Follow-up in clinic in 2 to 3 months or sooner if needed.  I have spent atleast 15 minutes  face to face with patient today. More than 50 % of the time was spent for psychoeducation and supportive psychotherapy and care coordination.  This note was generated in part or whole with voice recognition software. Voice recognition is usually quite accurate but  there are transcription errors that can and very often do occur. I apologize for any typographical errors that were not detected and corrected.         Ursula Alert, MD 04/12/2018, 4:43 PM

## 2018-04-13 ENCOUNTER — Telehealth: Payer: Self-pay

## 2018-04-13 NOTE — Telephone Encounter (Signed)
received a fax that prior auth was approved  until 02-15-19

## 2018-04-13 NOTE — Telephone Encounter (Signed)
thanks

## 2018-04-13 NOTE — Telephone Encounter (Signed)
faxed and confirmed the approval notice to pharmacy

## 2018-04-13 NOTE — Telephone Encounter (Signed)
[employer] prior authorization form was completed and faxed and confirmed to [facility] for the zolpidem tart er 12.5mg 

## 2018-04-26 ENCOUNTER — Other Ambulatory Visit: Payer: Self-pay

## 2018-04-26 ENCOUNTER — Emergency Department: Payer: Medicare Other

## 2018-04-26 ENCOUNTER — Encounter: Payer: Self-pay | Admitting: Emergency Medicine

## 2018-04-26 ENCOUNTER — Emergency Department
Admission: EM | Admit: 2018-04-26 | Discharge: 2018-04-26 | Disposition: A | Discharge status: Home / Self Care | Payer: Medicare Other | Attending: Emergency Medicine | Admitting: Emergency Medicine

## 2018-04-26 DIAGNOSIS — E119 Type 2 diabetes mellitus without complications: Secondary | ICD-10-CM | POA: Insufficient documentation

## 2018-04-26 DIAGNOSIS — M545 Low back pain, unspecified: Secondary | ICD-10-CM

## 2018-04-26 DIAGNOSIS — R109 Unspecified abdominal pain: Secondary | ICD-10-CM | POA: Diagnosis not present

## 2018-04-26 DIAGNOSIS — R197 Diarrhea, unspecified: Secondary | ICD-10-CM | POA: Insufficient documentation

## 2018-04-26 DIAGNOSIS — E860 Dehydration: Secondary | ICD-10-CM | POA: Diagnosis not present

## 2018-04-26 DIAGNOSIS — Z79899 Other long term (current) drug therapy: Secondary | ICD-10-CM | POA: Diagnosis not present

## 2018-04-26 LAB — COMPREHENSIVE METABOLIC PANEL
ALT: 18 U/L (ref 0–44)
AST: 17 U/L (ref 15–41)
Albumin: 4.1 g/dL (ref 3.5–5.0)
Alkaline Phosphatase: 32 U/L — ABNORMAL LOW (ref 38–126)
Anion gap: 8 (ref 5–15)
BUN: 9 mg/dL (ref 6–20)
CO2: 20 mmol/L — ABNORMAL LOW (ref 22–32)
Calcium: 8.7 mg/dL — ABNORMAL LOW (ref 8.9–10.3)
Chloride: 108 mmol/L (ref 98–111)
Creatinine, Ser: 0.57 mg/dL (ref 0.44–1.00)
GFR calc Af Amer: >60 mL/min (ref >60)
GFR calc non Af Amer: >60 mL/min (ref >60)
Glucose, Bld: 108 mg/dL — ABNORMAL HIGH (ref 70–99)
Potassium: 3.5 mmol/L (ref 3.5–5.1)
Sodium: 136 mmol/L (ref 135–145)
Total Bilirubin: 0.9 mg/dL (ref 0.3–1.2)
Total Protein: 7.4 g/dL (ref 6.5–8.1)

## 2018-04-26 LAB — CBC
HCT: 39.9 % (ref 36.0–46.0)
Hemoglobin: 12.7 g/dL (ref 12.0–15.0)
MCH: 28.1 pg (ref 26.0–34.0)
MCHC: 31.8 g/dL (ref 30.0–36.0)
MCV: 88.3 fL (ref 80.0–100.0)
Platelets: 286 10*3/uL (ref 150–400)
RBC: 4.52 MIL/uL (ref 3.87–5.11)
RDW: 12.8 % (ref 11.5–15.5)
WBC: 6.5 10*3/uL (ref 4.0–10.5)
nRBC: 0 % (ref 0.0–0.2)

## 2018-04-26 LAB — URINALYSIS, COMPLETE (UACMP) WITH MICROSCOPIC
Bacteria, UA: NONE SEEN
Bilirubin Urine: NEGATIVE
Glucose, UA: NEGATIVE mg/dL
Hgb urine dipstick: NEGATIVE
Ketones, ur: NEGATIVE mg/dL
Nitrite: NEGATIVE
Protein, ur: NEGATIVE mg/dL
Specific Gravity, Urine: 1.02 (ref 1.005–1.030)
pH: 5 (ref 5.0–8.0)

## 2018-04-26 LAB — PREGNANCY, URINE: Preg Test, Ur: NEGATIVE

## 2018-04-26 MED ORDER — IOHEXOL 300 MG/ML  SOLN
100.0000 mL | Freq: Once | INTRAMUSCULAR | Status: AC | PRN
  Administered 2018-04-26: 100 mL via INTRAVENOUS

## 2018-04-26 MED ORDER — SODIUM CHLORIDE 0.9 % IV BOLUS
1000.0000 mL | Freq: Once | INTRAVENOUS | Status: AC
  Administered 2018-04-26: 1000 mL via INTRAVENOUS

## 2018-04-26 MED ORDER — ONDANSETRON 4 MG PO TBDP: 4.0000 mg | ORAL_TABLET | Freq: Three times a day (TID) | ORAL | 0 refills | Status: DC | PRN

## 2018-04-26 MED ORDER — KETOROLAC TROMETHAMINE 30 MG/ML IJ SOLN
15.0000 mg | Freq: Once | INTRAMUSCULAR | Status: AC
  Administered 2018-04-26: 15 mg via INTRAVENOUS
  Filled 2018-04-26: qty 1

## 2018-04-26 MED ORDER — ONDANSETRON HCL 4 MG/2ML IJ SOLN
4.0000 mg | Freq: Once | INTRAMUSCULAR | Status: AC
  Administered 2018-04-26: 4 mg via INTRAVENOUS
  Filled 2018-04-26: qty 2

## 2018-04-26 MED ORDER — IBUPROFEN 600 MG PO TABS: 600.0000 mg | ORAL_TABLET | Freq: Four times a day (QID) | ORAL | 0 refills | Status: DC | PRN

## 2018-04-26 NOTE — ED Provider Notes (Signed)
[facility] Emergency Department Provider Note  ____________________________________________  Time seen: Approximately 3:42 PM  I have reviewed the triage vital signs and the nursing notes.   HISTORY  Chief Complaint Back Pain; Numbness; Abdominal Pain; and Diarrhea   HPI [name] is a 43 y.o. female with history of lumbar disc disease, diabetes, chronic abdominal pain, chronic back pain, neuropathy, anemia, anxiety and PTSD who presents for evaluation of several medical complaints.  Patient reports that she was in her usual state of health when she went to bed last night.  She woke up at 1:30AM after having a accidental BM while in bed.  When she woke up she also noted pain in her lower back that she describes as dull, constant, severe, going diffusely across the lower back and down the posterior aspect of her thighs all the way to her knees. She reports that the posterior aspect of both thighs feels numb to sharp sensation. She reports poking herself with the tip of a pen cap and not feeling sharp, only pressure. She reports a history of sciatica however reports that this pain is very different and the numbness is also new.  She denies bowel or urinary incontinence other than this episode that woke her up at 1:30 AM.  She denies weakness of her lower extremities, saddle anesthesia, new trauma to her back.  She reports having severe diarrhea since then with several episodes of watery diarrhea.  No melena or hematochezia.  She has had nausea but no vomiting.  She denies any abdominal pain or dizziness, no fever or chills   Past Medical History:  Diagnosis Date   Abnormal ECG    Anemia    Anxiety    Arthritis    B12 deficiency    Cardiomyopathy ([place])    Chronic abdominal pain    Complication of anesthesia    difficulty to get sedated during endoscopy   DDD (degenerative disc disease), lumbar    Depression    Diabetes mellitus without  complication ([place])    Dysrhythmia    GERD (gastroesophageal reflux disease)    Headache    Iron deficiency    Neuropathy    Obesity    Ovarian cyst    Pneumonia    within past five years   PTSD (post-traumatic stress disorder)    PTSD (post-traumatic stress disorder)    Rh negative status during pregnancy    Seizures ([place])    Small bowel obstruction ([place])     Patient Active Problem List   Diagnosis Date Noted   Iron deficiency anemia 11/12/2015   Uterine fibroid 09/30/2015   Abnormal uterine bleeding 09/29/2015   Abdominal pain, generalized 03/27/2015   Degeneration of intervertebral disc of lumbar region 12/18/2014   Abnormal ECG 10/09/2014   Achilles tendinitis 10/09/2014   Anxiety 10/09/2014   DDD (degenerative disc disease), lumbar 10/09/2014   Clinical depression 10/09/2014   Class 1 obesity 10/09/2014   Family planning 06/06/2014   Fibroid 06/06/2014   Bariatric surgery status 06/06/2014   Avitaminosis D 04/22/2014   Achilles bursitis 04/12/2014   DDD (degenerative disc disease), lumbosacral 04/12/2014   LBP (low back pain) 04/12/2014   Abdominal pain, right upper quadrant 04/12/2014   Degeneration of intervertebral disc of lumbosacral region 04/12/2014   Diabetes mellitus arising in pregnancy 04/01/2014   Personal history of surgery to heart and great vessels, presenting hazards to health 04/01/2014   Other specified postprocedural states 04/01/2014   Episode of syncope  02/04/2014   Breathlessness on exertion 02/04/2014   Awareness of heartbeats 12/11/2013   Cardiomyopathy ([place]) 10/29/2013   Dizziness 10/29/2013   Mixed incontinence 08/31/2012   Adiposity 08/31/2012   Chronic pain associated with significant psychosocial dysfunction 05/12/2012   Disorder of sacrum 05/12/2012   Arthropathy of lumbar facet joint 05/12/2012   Lumbar and sacral osteoarthritis 05/12/2012   Arthralgia, sacroiliac 05/12/2012     Past Surgical History:  Procedure Laterality Date   ABDOMINAL HYSTERECTOMY     BILATERAL SALPINGECTOMY Bilateral 09/29/2015   Procedure: BILATERAL SALPINGECTOMY;  Surgeon: Benjaman Kindler, MD;  Location: [facility] ORS;  Service: Gynecology;  Laterality: Bilateral;   bowel obstruction  2011   CHOLECYSTECTOMY     COLONOSCOPY WITH PROPOFOL N/A 01/24/2018   Procedure: COLONOSCOPY WITH PROPOFOL;  Surgeon: Toledo, Benay Pike, MD;  Location: [facility] ENDOSCOPY;  Service: Gastroenterology;  Laterality: N/A;   DILATION AND CURETTAGE OF UTERUS     ESOPHAGOGASTRODUODENOSCOPY     ESOPHAGOGASTRODUODENOSCOPY (EGD) WITH PROPOFOL N/A 01/24/2018   Procedure: ESOPHAGOGASTRODUODENOSCOPY (EGD) WITH PROPOFOL;  Surgeon: Toledo, Benay Pike, MD;  Location: [facility] ENDOSCOPY;  Service: Gastroenterology;  Laterality: N/A;   GASTRIC BYPASS     gi bleed  2009   Surgery-was in ICU for three weeks   HERNIA REPAIR     OVARIAN CYST REMOVAL Left 09/29/2015   Procedure: OVARIAN CYSTECTOMY;  Surgeon: Benjaman Kindler, MD;  Location: [facility] ORS;  Service: Gynecology;  Laterality: Left;   ROUX-EN-Y PROCEDURE     VAGINAL HYSTERECTOMY N/A 09/29/2015   Procedure: HYSTERECTOMY VAGINAL;  Surgeon: Benjaman Kindler, MD;  Location: [facility] ORS;  Service: Gynecology;  Laterality: N/A;    Prior to Admission medications   Medication Sig Start Date End Date Taking? Authorizing Provider  acetaminophen (TYLENOL) 325 MG tablet Take 650 mg by mouth every 4 (four) hours as needed for moderate pain.  04/03/14   [provider]  buPROPion (WELLBUTRIN XL) 150 MG 24 hr tablet Take 2 tablets (300 mg total) by mouth daily. 01/10/18   Ursula Alert, MD  cyanocobalamin (,VITAMIN B-12,) 1000 MCG/ML injection Inject 1,000 mcg into the muscle every 30 (thirty) days.  01/13/15   [provider]  diphenoxylate-atropine (LOMOTIL) 2.5-0.025 MG tablet  10/04/17   [provider]  ibuprofen (ADVIL,MOTRIN) 600 MG tablet Take 1 tablet  (600 mg total) by mouth every 6 (six) hours as needed. 04/26/18   Rudene Re, MD  metoprolol succinate (TOPROL-XL) 25 MG 24 hr tablet Take by mouth. 12/12/17 12/12/18  [provider]  Multiple Vitamins-Minerals (HM MULTIVITAMIN ADULT GUMMY) CHEW Chew 2 tablets by mouth daily.    [provider]  ondansetron (ZOFRAN ODT) 4 MG disintegrating tablet Take 1 tablet (4 mg total) by mouth every 8 (eight) hours as needed. 04/26/18   Rudene Re, MD  SUMAtriptan (IMITREX) 100 MG tablet Take 100 mg by mouth every 2 (two) hours as needed for migraine or headache.     [provider]  thiamine (VITAMIN B-1) 100 MG tablet Take 100 mg by mouth. 07/22/08   [provider]  topiramate (TOPAMAX) 50 MG tablet TAKE 1 TABLET BY MOUTH IN THE MORNING, AND 2 TABLETS EVERY NIGHT AT BEDTIME 11/01/17   [provider]  traMADol (ULTRAM) 50 MG tablet TAKE 50-100 MG EVERY 4 HOURS AS NEEDED FOR PAIN NO MORE THAN 8 TABS IN 24 HOURS 02/26/15   [provider]  Vilazodone HCl (VIIBRYD) 20 MG TABS Take 1.5 tablets (30 mg total) by mouth daily. 01/10/18  Ursula Alert, MD  zolpidem (AMBIEN CR) 12.5 MG CR tablet TAKE 1 TABLET(12.5 MG) BY MOUTH AT BEDTIME AS NEEDED FOR SLEEP 03/20/18   Ursula Alert, MD    Allergies Amoxicillin; Penicillins; Morphine; and Sulfa antibiotics  Family History  Problem Relation Age of Onset   Arthritis/Rheumatoid Mother    Heart block Mother    Clotting disorder Mother    Osteoporosis Mother    Heart attack Mother    Anxiety disorder Mother    Depression Mother    Heart disease Father    Heart attack Father    Depression Sister    Anxiety disorder Sister     Social History Social History   Tobacco Use   Smoking status: Never Smoker   Smokeless tobacco: Never Used  Substance Use Topics   Alcohol use: No    Alcohol/week: 0.0 standard drinks   Drug use: No    Review of Systems  Constitutional: Negative  for fever. Eyes: Negative for visual changes. ENT: Negative for sore throat. Neck: No neck pain  Cardiovascular: Negative for chest pain. Respiratory: Negative for shortness of breath. Gastrointestinal: Negative for abdominal pain, vomiting. + diarrhea. Genitourinary: Negative for dysuria. Musculoskeletal: + back pain. Skin: Negative for rash. Neurological: Negative for headaches. + numbness of legs Psych: No SI or HI  ____________________________________________   PHYSICAL EXAM:  VITAL SIGNS: ED Triage Vitals [04/26/18 1403]  Enc Vitals Group     BP 104/60     Pulse Rate (!) 110     Resp 16     Temp 99.7 F (37.6 C)     Temp Source Oral     SpO2 100 %     Weight 150 lb (68 kg)     Height 5\' 7"  (1.702 m)     Head Circumference      Peak Flow      Pain Score 8     Pain Loc      Pain Edu?      Excl. in [place]?     Constitutional: Alert and oriented. Well appearing and in no apparent distress. HEENT:      Head: Normocephalic and atraumatic.         Eyes: Conjunctivae are normal. Sclera is non-icteric.       Mouth/Throat: Mucous membranes are moist.       Neck: Supple with no signs of meningismus. Cardiovascular: Tachycardic with regular rhythm. No murmurs, gallops, or rubs. 2+ symmetrical distal pulses are present in all extremities. No JVD. Respiratory: Normal respiratory effort. Lungs are clear to auscultation bilaterally. No wheezes, crackles, or rhonchi.  Gastrointestinal: Soft, non tender, and non distended with positive bowel sounds. No rebound or guarding. Genitourinary: No CVA tenderness.  Rectal exam showed normal tone with no stool in the rectal vault Musculoskeletal: No midline C/T/L spine tenderness. Nontender with normal range of motion in all extremities. No edema, cyanosis, or erythema of extremities. Neurologic: Normal speech and language. Face is symmetric. Normal rectal tone, normal strength x4, no drift, 2+ DTRs of b/l LE, no urinary retention Skin: Skin  is warm, dry and intact. No rash noted. Psychiatric: Mood and affect are normal. Speech and behavior are normal.  ____________________________________________   LABS (all labs ordered are listed, but only abnormal results are displayed)  Labs Reviewed  COMPREHENSIVE METABOLIC PANEL - Abnormal; Notable for the following components:      Result Value   CO2 20 (*)    Glucose, Bld 108 (*)    Calcium  8.7 (*)    Alkaline Phosphatase 32 (*)    All other components within normal limits  URINALYSIS, COMPLETE (UACMP) WITH MICROSCOPIC - Abnormal; Notable for the following components:   Color, Urine YELLOW (*)    APPearance HAZY (*)    Leukocytes,Ua MODERATE (*)    All other components within normal limits  CBC  PREGNANCY, URINE   ____________________________________________  EKG  none  ____________________________________________  RADIOLOGY  I have personally reviewed the images performed during this visit and I agree with the Radiologist's read.   Interpretation by Radiologist:  Ct Abdomen Pelvis W Contrast  Result Date: 04/26/2018 CLINICAL DATA:  Low back pain and right-sided abdominal pain EXAM: CT ABDOMEN AND PELVIS WITH CONTRAST TECHNIQUE: Multidetector CT imaging of the abdomen and pelvis was performed using the standard protocol following bolus administration of intravenous contrast. CONTRAST:  163mL OMNIPAQUE 300 COMPARISON:  None FINDINGS: Lower chest: No acute abnormality. Hepatobiliary: No focal liver abnormality is seen. Status post cholecystectomy. No biliary dilatation. Pancreas: Unremarkable. No pancreatic ductal dilatation or surrounding inflammatory changes. Spleen: Normal in size without focal abnormality. Adrenals/Urinary Tract: Adrenal glands are within normal limits bilaterally. No renal calculi or obstructive changes are seen. The bladder is partially distended. Stomach/Bowel: Changes of prior gastric bypass are noted. Fluid is noted within the colon consistent with  the given clinical history of diarrhea. No obstructive or inflammatory changes are seen. The appendix is not well visualized. No inflammatory changes to suggest appendicitis are noted. Vascular/Lymphatic: No significant vascular findings are present. No enlarged abdominal or pelvic lymph nodes. Reproductive: Status post hysterectomy. No adnexal masses. Other: No abdominal wall hernia or abnormality. No abdominopelvic ascites. Musculoskeletal: No acute or significant osseous findings. IMPRESSION: Chronic changes without acute abnormality. Electronically Signed   By: Inez Catalina M.D.   On: 04/26/2018 16:14      ____________________________________________   PROCEDURES  Procedure(s) performed: None Procedures Critical Care performed:  None ____________________________________________   INITIAL IMPRESSION / ASSESSMENT AND PLAN / ED COURSE  43 y.o. female with history of lumbar disc disease, diabetes, chronic abdominal pain, chronic back pain, neuropathy, anemia, anxiety and PTSD who presents for evaluation of several medical complaints.  Patient complaining of back pain with numbness going down the posterior aspect of both of her legs and also several episodes of diarrhea.  The fact that all symptoms started together 1:30 AM makes her presentation slightly puzzling.  She could have acute on chronic exacerbation of her disc bulging disease.  At this time there is no signs or symptoms of cauda equina with normal PVR, normal rectal tone, normal reflexes, normal strength.  She does look a little bit dry on exam with mild tachycardia and a low-grade fever.  She is complaining of diarrhea and chills.  Most likely viral gastroenteritis.  Will get a CT abdomen pelvis to rule out colitis versus diverticulitis.  She has no respiratory symptoms therefore flu is less likely.  Will give Toradol, Zofran and fluids for her symptoms.  Clinical Course as of Apr 25 1736  Wed Apr 26, 2018  1734 Labs and CAT scan with  no acute findings.  After IV fluids, Zofran and Toradol patient reports full resolution of her back pain and numbness in her legs.  I spoke with Dr. Golden Circle from radiology who did not see any acute changes in patient's lumbar spine on CT when compared to recent imaging.  Patient remains completely neurologically intact.  No further episodes of diarrhea in the emergency room.  Will discharge home with a bland diet, Zofran for nausea, increase oral hydration, and follow-up with primary care doctor.  Discussed return precautions for any signs of cauda equina, dehydration, abdominal pain, or fever.   [CV]  2919 CT ABDOMEN PELVIS W CONTRAST [CV]    Clinical Course User Index [CV] Alfred Levins [place], MD     As part of my medical decision making, I reviewed the following data within the [facility] notes reviewed and incorporated, Labs reviewed , Old chart reviewed, Radiograph reviewed , Discussed with radiologist, Notes from prior ED visits and [place] Controlled Substance Database    Pertinent labs & imaging results that were available during my care of the patient were reviewed by me and considered in my medical decision making (see chart for details).    ____________________________________________   FINAL CLINICAL IMPRESSION(S) / ED DIAGNOSES  Final diagnoses:  Diarrhea of presumed infectious origin  Low back pain without sciatica, unspecified back pain laterality, unspecified chronicity  Dehydration      NEW MEDICATIONS STARTED DURING THIS VISIT:  ED Discharge Orders         Ordered    ibuprofen (ADVIL,MOTRIN) 600 MG tablet  Every 6 hours PRN     04/26/18 1737    ondansetron (ZOFRAN ODT) 4 MG disintegrating tablet  Every 8 hours PRN     04/26/18 1737           Note:  This document was prepared using Dragon voice recognition software and may include unintentional dictation errors.    Alfred Levins, [place], MD 04/26/18 [phone]

## 2018-04-26 NOTE — ED Notes (Signed)
Pt with full ROM x 4. No weakness noted.

## 2018-04-26 NOTE — ED Triage Notes (Signed)
Since 1am she has low back pain down legs.  Says numbness in thighs and bouttocks.  Right lower quadrqant pain and uncontrollable diarrhea.

## 2018-05-16 ENCOUNTER — Other Ambulatory Visit: Payer: Self-pay | Admitting: Psychiatry

## 2018-05-16 DIAGNOSIS — F4310 Post-traumatic stress disorder, unspecified: Secondary | ICD-10-CM

## 2018-05-16 DIAGNOSIS — F410 Panic disorder [episodic paroxysmal anxiety] without agoraphobia: Secondary | ICD-10-CM

## 2018-05-16 DIAGNOSIS — F331 Major depressive disorder, recurrent, moderate: Secondary | ICD-10-CM

## 2018-06-01 ENCOUNTER — Telehealth: Payer: Self-pay

## 2018-06-01 DIAGNOSIS — F331 Major depressive disorder, recurrent, moderate: Secondary | ICD-10-CM

## 2018-06-01 MED ORDER — ZOLPIDEM TARTRATE ER 12.5 MG PO TBCR: EXTENDED_RELEASE_TABLET | ORAL | 2 refills | Status: DC

## 2018-06-01 NOTE — Telephone Encounter (Signed)
pt called states she will not have enough zolpidem to get to her next appt.

## 2018-06-01 NOTE — Telephone Encounter (Signed)
Sent Ambien to pharmacy 

## 2018-06-07 ENCOUNTER — Other Ambulatory Visit: Payer: Self-pay | Admitting: Psychiatry

## 2018-06-07 DIAGNOSIS — F331 Major depressive disorder, recurrent, moderate: Secondary | ICD-10-CM

## 2018-06-16 ENCOUNTER — Telehealth: Payer: Self-pay

## 2018-06-16 DIAGNOSIS — F331 Major depressive disorder, recurrent, moderate: Secondary | ICD-10-CM

## 2018-06-16 MED ORDER — ZOLPIDEM TARTRATE ER 12.5 MG PO TBCR: EXTENDED_RELEASE_TABLET | ORAL | 0 refills | Status: DC

## 2018-06-16 NOTE — Telephone Encounter (Signed)
Patient called regarding her Zolpidem 12.5mg . The pharmacy that the prescription was called in to and she normally uses only did a partial fill (only 4-5 tablets). They told her the remainder of her prescription is on backorder and they have no idea when the remainder can be filled. There is another pharmacy, [facility] on [address] phone number [phone], that has the full prescription for her. Patient only has 1 tablet left and is very anxious about not having her full prescription. Can the prescription at her normal pharmacy ([facility] [id number] be cancelled and sent to [facility] at [address]. [place] for just this one time? I told her I would call her back and let her know the outcome before we leave today due to her anxiety concerning this.  Please review and advise. Thank you.

## 2018-06-16 NOTE — Telephone Encounter (Signed)
I just sent it to [facility] [address]

## 2018-06-16 NOTE — Telephone Encounter (Signed)
Sent ambien to pharmacy as per request.

## 2018-07-12 ENCOUNTER — Other Ambulatory Visit: Payer: Self-pay

## 2018-07-12 ENCOUNTER — Encounter: Payer: Self-pay | Admitting: Psychiatry

## 2018-07-12 ENCOUNTER — Ambulatory Visit (INDEPENDENT_AMBULATORY_CARE_PROVIDER_SITE_OTHER): Payer: Medicare Other | Admitting: Psychiatry

## 2018-07-12 ENCOUNTER — Ambulatory Visit: Payer: Medicare Other | Admitting: Psychiatry

## 2018-07-12 DIAGNOSIS — F410 Panic disorder [episodic paroxysmal anxiety] without agoraphobia: Secondary | ICD-10-CM | POA: Diagnosis not present

## 2018-07-12 DIAGNOSIS — F331 Major depressive disorder, recurrent, moderate: Secondary | ICD-10-CM

## 2018-07-12 DIAGNOSIS — F4310 Post-traumatic stress disorder, unspecified: Secondary | ICD-10-CM

## 2018-07-12 DIAGNOSIS — F5105 Insomnia due to other mental disorder: Secondary | ICD-10-CM | POA: Diagnosis not present

## 2018-07-12 MED ORDER — ZOLPIDEM TARTRATE ER 12.5 MG PO TBCR: EXTENDED_RELEASE_TABLET | ORAL | 3 refills | Status: DC

## 2018-07-12 NOTE — Progress Notes (Signed)
Virtual Visit via Video Note  I connected with [name] on 07/12/18 at  3:30 PM EDT by a video enabled telemedicine application and verified that I am speaking with the correct person using two identifiers.   I discussed the limitations of evaluation and management by telemedicine and the availability of in person appointments. The patient expressed understanding and agreed to proceed.    I discussed the assessment and treatment plan with the patient. The patient was provided an opportunity to ask questions and all were answered. The patient agreed with the plan and demonstrated an understanding of the instructions.   The patient was advised to call back or seek an in-person evaluation if the symptoms worsen or if the condition fails to improve as anticipated.   [place] MD OP Progress Note  07/12/2018 5:30 PM [name]  MRN:  [id number]  Chief Complaint:  Chief Complaint    Follow-up     HPI: [name] is a 43 year old Caucasian female, divorced, on SSD, lives in [place], has a history of MDD, panic attacks, PTSD, insomnia was evaluated by telemedicine today.  Patient reports she is currently coping okay with the COVID-19 outbreak.  She was mildly anxious initially however has been getting better.  She continues to follow precautions.  She reports she has been spending a lot of time with her daughter.  Patient reports her mood symptoms is fair.  She denies any significant depressive symptoms.  She reports sleep is okay on the Ambien.  Patient denies any suicidality, homicidality or perceptual disturbances.  Patient denies any other concerns today. Visit Diagnosis:    ICD-10-CM   1. MDD (major depressive disorder), recurrent episode, moderate (HCC) F33.1    in early remission  2. Panic disorder F41.0   3. PTSD (post-traumatic stress disorder) F43.10   4. Insomnia due to mental condition F51.05     Past Psychiatric History: I have reviewed past psychiatric  history from my progress note on 05/09/2017.  Past Medical History:  Past Medical History:  Diagnosis Date  . Abnormal ECG   . Anemia   . Anxiety   . Arthritis   . B12 deficiency   . Cardiomyopathy ([place])   . Chronic abdominal pain   . Complication of anesthesia    difficulty to get sedated during endoscopy  . DDD (degenerative disc disease), lumbar   . Depression   . Diabetes mellitus without complication ([place])   . Dysrhythmia   . GERD (gastroesophageal reflux disease)   . Headache   . Iron deficiency   . Neuropathy   . Obesity   . Ovarian cyst   . Pneumonia    within past five years  . PTSD (post-traumatic stress disorder)   . PTSD (post-traumatic stress disorder)   . Rh negative status during pregnancy   . Seizures ([place])   . Small bowel obstruction [facility])     Past Surgical History:  Procedure Laterality Date  . ABDOMINAL HYSTERECTOMY    . BILATERAL SALPINGECTOMY Bilateral 09/29/2015   Procedure: BILATERAL SALPINGECTOMY;  Surgeon: Benjaman Kindler, MD;  Location: [facility] ORS;  Service: Gynecology;  Laterality: Bilateral;  . bowel obstruction  2011  . CHOLECYSTECTOMY    . COLONOSCOPY WITH PROPOFOL N/A 01/24/2018   Procedure: COLONOSCOPY WITH PROPOFOL;  Surgeon: Toledo, Benay Pike, MD;  Location: [facility] ENDOSCOPY;  Service: Gastroenterology;  Laterality: N/A;  . DILATION AND CURETTAGE OF UTERUS    . ESOPHAGOGASTRODUODENOSCOPY    . ESOPHAGOGASTRODUODENOSCOPY (EGD) WITH PROPOFOL N/A 01/24/2018  Procedure: ESOPHAGOGASTRODUODENOSCOPY (EGD) WITH PROPOFOL;  Surgeon: Toledo, Benay Pike, MD;  Location: [facility] ENDOSCOPY;  Service: Gastroenterology;  Laterality: N/A;  . GASTRIC BYPASS    . gi bleed  2009   Surgery-was in ICU for three weeks  . HERNIA REPAIR    . OVARIAN CYST REMOVAL Left 09/29/2015   Procedure: OVARIAN CYSTECTOMY;  Surgeon: Benjaman Kindler, MD;  Location: [facility] ORS;  Service: Gynecology;  Laterality: Left;  . ROUX-EN-Y PROCEDURE    . VAGINAL HYSTERECTOMY N/A 09/29/2015    Procedure: HYSTERECTOMY VAGINAL;  Surgeon: Benjaman Kindler, MD;  Location: [facility] ORS;  Service: Gynecology;  Laterality: N/A;    Family Psychiatric History: Reviewed family psychiatric history from my progress note on 05/09/2017. Family History:  Family History  Problem Relation Age of Onset  . Arthritis/Rheumatoid Mother   . Heart block Mother   . Clotting disorder Mother   . Osteoporosis Mother   . Heart attack Mother   . Anxiety disorder Mother   . Depression Mother   . Heart disease Father   . Heart attack Father   . Depression Sister   . Anxiety disorder Sister     Social History: Reviewed social history from my progress note on 05/09/2017. Social History   Socioeconomic History  . Marital status: Single    Spouse name: Not on file  . Number of children: Not on file  . Years of education: Not on file  . Highest education level: Not on file  Occupational History  . Not on file  Social Needs  . Financial resource strain: Not on file  . Food insecurity:    Worry: Not on file    Inability: Not on file  . Transportation needs:    Medical: Not on file    Non-medical: Not on file  Tobacco Use  . Smoking status: Never Smoker  . Smokeless tobacco: Never Used  Substance and Sexual Activity  . Alcohol use: No    Alcohol/week: 0.0 standard drinks  . Drug use: No  . Sexual activity: Yes    Partners: Male    Birth control/protection: Surgical  Lifestyle  . Physical activity:    Days per week: Not on file    Minutes per session: Not on file  . Stress: Not on file  Relationships  . Social connections:    Talks on phone: Not on file    Gets together: Not on file    Attends religious service: Not on file    Active member of club or organization: Not on file    Attends meetings of clubs or organizations: Not on file    Relationship status: Not on file  Other Topics Concern  . Not on file  Social History Narrative  . Not on file    Allergies:  Allergies  Allergen  Reactions  . Amoxicillin Anaphylaxis, Hives, Shortness Of Breath, Swelling and Rash    Has patient had a PCN reaction causing immediate rash, facial/tongue/throat swelling, SOB or lightheadedness with hypotension: Yes Has patient had a PCN reaction causing severe rash involving mucus membranes or skin necrosis: Yes Has patient had a PCN reaction that required hospitalization No Has patient had a PCN reaction occurring within the last 10 years: No If all of the above answers are "NO", then may proceed with Cephalosporin use. Other reaction(s): ANAPHYLAXIS Other reaction(s): ANAPHYLAX  . Penicillins Anaphylaxis, Swelling and Rash    Has patient had a PCN reaction causing immediate rash, facial/tongue/throat swelling, SOB or lightheadedness with hypotension: Yes  Has patient had a PCN reaction causing severe rash involving mucus membranes or skin necrosis: Yes Has patient had a PCN reaction that required hospitalization No Has patient had a PCN reaction occurring within the last 10 years: No If all of the above answers are "NO", then may proceed with Cephalosporin use.   [name] Morphine Other (See Comments)    Muscle spasms  . Sulfa Antibiotics     Other reaction(s): VOMITING    Metabolic Disorder Labs: Lab Results  Component Value Date   HGBA1C 4.8 03/23/2014   No results found for: PROLACTIN No results found for: CHOL, TRIG, HDL, CHOLHDL, VLDL, LDLCALC Lab Results  Component Value Date   TSH  05/13/2009    3.163 (NOTE) Please note change in reference ranges for ages 70W to 32Y. Test methodology is 3rd generation TSH   TSH 1.503 Test methodology is 3rd generation TSH 09/05/2008    Therapeutic Level Labs: No results found for: LITHIUM No results found for: VALPROATE No components found for:  CBMZ  Current Medications: Current Outpatient Medications  Medication Sig Dispense Refill  . acetaminophen (TYLENOL) 325 MG tablet Take 650 mg by mouth every 4 (four) hours as needed for  moderate pain.     [name] buPROPion (WELLBUTRIN XL) 150 MG 24 hr tablet TAKE 2 TABLETS(300 MG) BY MOUTH DAILY 60 tablet 3  . cyanocobalamin (,VITAMIN B-12,) 1000 MCG/ML injection Inject 1,000 mcg into the muscle every 30 (thirty) days.     . diphenoxylate-atropine (LOMOTIL) 2.5-0.025 MG tablet     . ibuprofen (ADVIL,MOTRIN) 600 MG tablet Take 1 tablet (600 mg total) by mouth every 6 (six) hours as needed. 20 tablet 0  . metoprolol succinate (TOPROL-XL) 25 MG 24 hr tablet Take by mouth.    . Multiple Vitamins-Minerals (HM MULTIVITAMIN ADULT GUMMY) CHEW Chew 2 tablets by mouth daily.    . ondansetron (ZOFRAN ODT) 4 MG disintegrating tablet Take 1 tablet (4 mg total) by mouth every 8 (eight) hours as needed. 20 tablet 0  . SUMAtriptan (IMITREX) 100 MG tablet Take 100 mg by mouth every 2 (two) hours as needed for migraine or headache.     . thiamine (VITAMIN B-1) 100 MG tablet Take 100 mg by mouth.    . topiramate (TOPAMAX) 50 MG tablet TAKE 1 TABLET BY MOUTH IN THE MORNING, AND 2 TABLETS EVERY NIGHT AT BEDTIME    . traMADol (ULTRAM) 50 MG tablet TAKE 50-100 MG EVERY 4 HOURS AS NEEDED FOR PAIN NO MORE THAN 8 TABS IN 24 HOURS  0  . VIIBRYD 20 MG TABS TAKE 1 AND 1/2 TABLETS(30 MG) BY MOUTH DAILY 45 tablet 3  . zolpidem (AMBIEN CR) 12.5 MG CR tablet TAKE 1 TABLET(12.5 MG) BY MOUTH AT BEDTIME AS NEEDED FOR SLEEP 30 tablet 3   No current facility-administered medications for this visit.      Musculoskeletal: Strength & Muscle Tone: within normal limits Gait & Station: normal Patient leans: N/A  Psychiatric Specialty Exam: Review of Systems  Psychiatric/Behavioral: The patient is nervous/anxious.   All other systems reviewed and are negative.   There were no vitals taken for this visit.There is no height or weight on file to calculate BMI.  General Appearance: Casual  Eye Contact:  Fair  Speech:  Normal Rate  Volume:  Normal  Mood:  Anxious improving  Affect:  Congruent  Thought Process:  Goal  Directed and Descriptions of Associations: Intact  Orientation:  Full (Time, Place, and Person)  Thought Content: Logical  Suicidal Thoughts:  No  Homicidal Thoughts:  No  Memory:  Immediate;   Fair Recent;   Fair Remote;   Fair  Judgement:  Fair  Insight:  Fair  Psychomotor Activity:  Normal  Concentration:  Concentration: Fair and Attention Span: Fair  Recall:  [employer] of Knowledge: Fair  Language: Fair  Akathisia:  No  Handed:  Right  AIMS (if indicated): Denies tremors, rigidity, stiffness  Assets:  Communication Skills Desire for Improvement Social Support  ADL's:  Intact  Cognition: WNL  Sleep:  Fair   Screenings:   Assessment and Plan: [name] is a 43 year old Caucasian female who has a history of depression, anxiety, insomnia, was evaluated by telemedicine today.  Patient is currently making progress on the current medication regimen.  Plan as noted below.  Plan For MDD-stable Wellbutrin XL 300 mg p.o. daily-reduced dosage Patient is aware about the risk of Wellbutrin lowering seizure threshold. Viibryd 30 mg p.o. daily.  For anxiety symptoms- stable Propranolol 10 mg p.o. 3 times daily as needed  For insomnia-stable Ambien CR 12.5 mg p.o. nightly.   Follow-up in clinic in 2 to 3 months or sooner if needed.  Appointment scheduled for July 31 at 10:30 AM  I have spent atleast 15 minutes non face to face with patient today. More than 50 % of the time was spent for psychoeducation and supportive psychotherapy and care coordination.  This note was generated in part or whole with voice recognition software. Voice recognition is usually quite accurate but there are transcription errors that can and very often do occur. I apologize for any typographical errors that were not detected and corrected.       Ursula Alert, MD 07/12/2018, 5:30 PM

## 2018-07-26 DIAGNOSIS — G629 Polyneuropathy, unspecified: Secondary | ICD-10-CM | POA: Insufficient documentation

## 2018-08-01 DIAGNOSIS — M793 Panniculitis, unspecified: Secondary | ICD-10-CM | POA: Insufficient documentation

## 2018-09-15 ENCOUNTER — Encounter: Payer: Self-pay | Admitting: Psychiatry

## 2018-09-15 ENCOUNTER — Other Ambulatory Visit: Payer: Self-pay

## 2018-09-15 ENCOUNTER — Ambulatory Visit (INDEPENDENT_AMBULATORY_CARE_PROVIDER_SITE_OTHER): Payer: Medicare Other | Admitting: Psychiatry

## 2018-09-15 DIAGNOSIS — F5105 Insomnia due to other mental disorder: Secondary | ICD-10-CM

## 2018-09-15 DIAGNOSIS — F410 Panic disorder [episodic paroxysmal anxiety] without agoraphobia: Secondary | ICD-10-CM | POA: Diagnosis not present

## 2018-09-15 DIAGNOSIS — F331 Major depressive disorder, recurrent, moderate: Secondary | ICD-10-CM | POA: Diagnosis not present

## 2018-09-15 DIAGNOSIS — F4310 Post-traumatic stress disorder, unspecified: Secondary | ICD-10-CM

## 2018-09-15 MED ORDER — VILAZODONE HCL 40 MG PO TABS: 40.0000 mg | ORAL_TABLET | Freq: Every day | ORAL | 1 refills | Status: DC

## 2018-09-15 MED ORDER — BUPROPION HCL ER (XL) 150 MG PO TB24: ORAL_TABLET | ORAL | 3 refills | Status: DC

## 2018-09-15 NOTE — Progress Notes (Signed)
Virtual Visit via Video Note  I connected with [name] on 09/15/18 at 10:30 AM EDT by a video enabled telemedicine application and verified that I am speaking with the correct person using two identifiers.   I discussed the limitations of evaluation and management by telemedicine and the availability of in person appointments. The patient expressed understanding and agreed to proceed.    I discussed the assessment and treatment plan with the patient. The patient was provided an opportunity to ask questions and all were answered. The patient agreed with the plan and demonstrated an understanding of the instructions.   The patient was advised to call back or seek an in-person evaluation if the symptoms worsen or if the condition fails to improve as anticipated.   [place] MD OP Progress Note  09/15/2018 1:02 PM KEYARAH MCROY  MRN:  [id number]  Chief Complaint:  Chief Complaint    Follow-up     HPI: [name] is a 43 year old Caucasian female, divorced, on SSD, lives in [place], has a history of MDD, PTSD, panic attacks, insomnia was evaluated by telemedicine today.  Patient is currently struggling with some psychosocial stressors.  She reports she is worried about the COVID-19 as well as her son who is currently going through some health issues.  She reports the Viibryd does help to some extent.  She is interested in dosage increase.  She reports she struggles with some sadness, lack of motivation and some anxiety symptoms.  She denies any suicidality, homicidality or perceptual disturbances.  She reports sleep is okay on the Ambien.  Patient reports her daughter is soon going to start kindergarten and will be busy with that.  She is hoping she can start possibly therapy session with Otila Kluver after her daughter started school.  She denies any other concerns today. Visit Diagnosis:    ICD-10-CM   1. MDD (major depressive disorder), recurrent episode, moderate (HCC)  F33.1  buPROPion (WELLBUTRIN XL) 150 MG 24 hr tablet    Vilazodone HCl (VIIBRYD) 40 MG TABS   in early remission  2. Panic disorder  F41.0 Vilazodone HCl (VIIBRYD) 40 MG TABS  3. PTSD (post-traumatic stress disorder)  F43.10   4. Insomnia due to mental condition  F51.05     Past Psychiatric History: I have reviewed past psychiatric history from my progress note on 05/09/2017.  Past Medical History:  Past Medical History:  Diagnosis Date  . Abnormal ECG   . Anemia   . Anxiety   . Arthritis   . B12 deficiency   . Cardiomyopathy ([place])   . Chronic abdominal pain   . Complication of anesthesia    difficulty to get sedated during endoscopy  . DDD (degenerative disc disease), lumbar   . Depression   . Diabetes mellitus without complication ([place])   . Dysrhythmia   . GERD (gastroesophageal reflux disease)   . Headache   . Iron deficiency   . Neuropathy   . Obesity   . Ovarian cyst   . Pneumonia    within past five years  . PTSD (post-traumatic stress disorder)   . PTSD (post-traumatic stress disorder)   . Rh negative status during pregnancy   . Seizures ([place])   . Small bowel obstruction [facility])     Past Surgical History:  Procedure Laterality Date  . ABDOMINAL HYSTERECTOMY    . BILATERAL SALPINGECTOMY Bilateral 09/29/2015   Procedure: BILATERAL SALPINGECTOMY;  Surgeon: Benjaman Kindler, MD;  Location: [facility] ORS;  Service: Gynecology;  Laterality: Bilateral;  .  bowel obstruction  2011  . CHOLECYSTECTOMY    . COLONOSCOPY WITH PROPOFOL N/A 01/24/2018   Procedure: COLONOSCOPY WITH PROPOFOL;  Surgeon: Toledo, Benay Pike, MD;  Location: [facility] END[facility];  Service: Gastroenterology;  Laterality: N/A;  . DILATION AND CURETTAGE OF UTERUS    . ESOPHAGOGASTRODUODENOSCOPY    . ESOPHAGOGASTRODUODENOSCOPY (EGD) WITH PROPOFOL N/A 01/24/2018   Procedure: ESOPHAGOGASTRODUODENOSCOPY (EGD) WITH PROPOFOL;  Surgeon: Toledo, Benay Pike, MD;  Location: [facility] ENDOSCOPY;  Service: Gastroenterology;  Laterality: N/A;   . GASTRIC BYPASS    . gi bleed  2009   Surgery-was in ICU for three weeks  . HERNIA REPAIR    . OVARIAN CYST REMOVAL Left 09/29/2015   Procedure: OVARIAN CYSTECTOMY;  Surgeon: Benjaman Kindler, MD;  Location: [facility] ORS;  Service: Gynecology;  Laterality: Left;  . ROUX-EN-Y PROCEDURE    . VAGINAL HYSTERECTOMY N/A 09/29/2015   Procedure: HYSTERECTOMY VAGINAL;  Surgeon: Benjaman Kindler, MD;  Location: [facility] ORS;  Service: Gynecology;  Laterality: N/A;    Family Psychiatric History: I have reviewed family psychiatric history from my progress note on 05/09/2017.  Family History:  Family History  Problem Relation Age of Onset  . Arthritis/Rheumatoid Mother   . Heart block Mother   . Clotting disorder Mother   . Osteoporosis Mother   . Heart attack Mother   . Anxiety disorder Mother   . Depression Mother   . Heart disease Father   . Heart attack Father   . Depression Sister   . Anxiety disorder Sister     Social History: I have reviewed social history from my progress note on 05/09/2017. Social History   Socioeconomic History  . Marital status: Single    Spouse name: Not on file  . Number of children: Not on file  . Years of education: Not on file  . Highest education level: Not on file  Occupational History  . Not on file  Social Needs  . Financial resource strain: Not on file  . Food insecurity    Worry: Not on file    Inability: Not on file  . Transportation needs    Medical: Not on file    Non-medical: Not on file  Tobacco Use  . Smoking status: Never Smoker  . Smokeless tobacco: Never Used  Substance and Sexual Activity  . Alcohol use: No    Alcohol/week: 0.0 standard drinks  . Drug use: No  . Sexual activity: Yes    Partners: Male    Birth control/protection: Surgical  Lifestyle  . Physical activity    Days per week: Not on file    Minutes per session: Not on file  . Stress: Not on file  Relationships  . Social Herbalist on phone: Not on file     Gets together: Not on file    Attends religious service: Not on file    Active member of club or organization: Not on file    Attends meetings of clubs or organizations: Not on file    Relationship status: Not on file  Other Topics Concern  . Not on file  Social History Narrative  . Not on file    Allergies:  Allergies  Allergen Reactions  . Amoxicillin Anaphylaxis, Hives, Shortness Of Breath, Swelling and Rash    Has patient had a PCN reaction causing immediate rash, facial/tongue/throat swelling, SOB or lightheadedness with hypotension: Yes Has patient had a PCN reaction causing severe rash involving mucus membranes or skin necrosis: Yes Has patient had  a PCN reaction that required hospitalization No Has patient had a PCN reaction occurring within the last 10 years: No If all of the above answers are "NO", then may proceed with Cephalosporin use. Other reaction(s): ANAPHYLAXIS Other reaction(s): ANAPHYLAX  . Penicillins Anaphylaxis, Swelling and Rash    Has patient had a PCN reaction causing immediate rash, facial/tongue/throat swelling, SOB or lightheadedness with hypotension: Yes Has patient had a PCN reaction causing severe rash involving mucus membranes or skin necrosis: Yes Has patient had a PCN reaction that required hospitalization No Has patient had a PCN reaction occurring within the last 10 years: No If all of the above answers are "NO", then may proceed with Cephalosporin use.   [name] Morphine Other (See Comments)    Muscle spasms  . Sulfa Antibiotics     Other reaction(s): VOMITING    Metabolic Disorder Labs: Lab Results  Component Value Date   HGBA1C 4.8 03/23/2014   No results found for: PROLACTIN No results found for: CHOL, TRIG, HDL, CHOLHDL, VLDL, LDLCALC Lab Results  Component Value Date   TSH  05/13/2009    3.163 (NOTE) Please note change in reference ranges for ages 33W to 59Y.Test methodology is 3rd generation TSH   TSH 1.503Test methodology is 3rd  generation TSH 09/05/2008    Therapeutic Level Labs: No results found for: LITHIUM No results found for: VALPROATE No components found for:  CBMZ  Current Medications: Current Outpatient Medications  Medication Sig Dispense Refill  . acetaminophen (TYLENOL) 325 MG tablet Take 650 mg by mouth every 4 (four) hours as needed for moderate pain.     [name] buPROPion (WELLBUTRIN XL) 150 MG 24 hr tablet TAKE 2 TABLETS(300 MG) BY MOUTH DAILY 60 tablet 3  . cyanocobalamin (,VITAMIN B-12,) 1000 MCG/ML injection Inject 1,000 mcg into the muscle every 30 (thirty) days.     . diphenoxylate-atropine (LOMOTIL) 2.5-0.025 MG tablet     . ibuprofen (ADVIL,MOTRIN) 600 MG tablet Take 1 tablet (600 mg total) by mouth every 6 (six) hours as needed. 20 tablet 0  . metoprolol succinate (TOPROL-XL) 25 MG 24 hr tablet Take by mouth.    . Multiple Vitamins-Minerals (HM MULTIVITAMIN ADULT GUMMY) CHEW Chew 2 tablets by mouth daily.    . ondansetron (ZOFRAN ODT) 4 MG disintegrating tablet Take 1 tablet (4 mg total) by mouth every 8 (eight) hours as needed. 20 tablet 0  . SUMAtriptan (IMITREX) 100 MG tablet Take 100 mg by mouth every 2 (two) hours as needed for migraine or headache.     . thiamine (VITAMIN B-1) 100 MG tablet Take 100 mg by mouth.    . topiramate (TOPAMAX) 50 MG tablet TAKE 1 TABLET BY MOUTH IN THE MORNING, AND 2 TABLETS EVERY NIGHT AT BEDTIME    . traMADol (ULTRAM) 50 MG tablet TAKE 50-100 MG EVERY 4 HOURS AS NEEDED FOR PAIN NO MORE THAN 8 TABS IN 24 HOURS  0  . Vilazodone HCl (VIIBRYD) 40 MG TABS Take 1 tablet (40 mg total) by mouth daily. 30 tablet 1  . zolpidem (AMBIEN CR) 12.5 MG CR tablet TAKE 1 TABLET(12.5 MG) BY MOUTH AT BEDTIME AS NEEDED FOR SLEEP 30 tablet 3   No current facility-administered medications for this visit.      Musculoskeletal: Strength & Muscle Tone: UTA Gait & Station: normal Patient leans: N/A  Psychiatric Specialty Exam: Review of Systems  Psychiatric/Behavioral: The  patient is nervous/anxious.   All other systems reviewed and are negative.   There were no  vitals taken for this visit.There is no height or weight on file to calculate BMI.  General Appearance: Casual  Eye Contact:  Fair  Speech:  Clear and Coherent  Volume:  Normal  Mood:  Anxious  Affect:  Congruent  Thought Process:  Goal Directed and Descriptions of Associations: Intact  Orientation:  Full (Time, Place, and Person)  Thought Content: Logical   Suicidal Thoughts:  No  Homicidal Thoughts:  No  Memory:  Immediate;   Fair Recent;   Fair Remote;   Fair  Judgement:  Fair  Insight:  Fair  Psychomotor Activity:  Normal  Concentration:  Concentration: Fair and Attention Span: Fair  Recall:  [employer] of Knowledge: Fair  Language: Fair  Akathisia:  No  Handed:  Right  AIMS (if indicated): uta  Assets:  Communication Skills Desire for Improvement Housing Social Support Talents/Skills  ADL's:  Intact  Cognition: WNL  Sleep:  Fair   Screenings:   Assessment and Plan: [name] is a 43 year old Caucasian female who has a history of depression, anxiety, insomnia was evaluated by telemedicine today.  Patient is currently making progress on the current medication regimen.  She however continues to struggle with some depressive symptoms and will benefit from medication readjustment.  Plan For MDD- some progress Wellbutrin XL 300 mg p.o. daily-reduced dosage. Increase Viibryd to 40 mg p.o. daily  For anxiety symptoms- progress Propranolol 10 mg p.o. 3 times daily as needed Viibryd 40 mg p.o. daily  For insomnia-stable Ambien CR 12.5 mg p.o. nightly.   Follow-up in clinic in 2 months or sooner if needed.  September 30 at 8:30 AM  I have spent atleast 15 minutes non face to face with patient today. More than 50 % of the time was spent for psychoeducation and supportive psychotherapy and care coordination.  This note was generated in part or whole with voice recognition  software. Voice recognition is usually quite accurate but there are transcription errors that can and very often do occur. I apologize for any typographical errors that were not detected and corrected.       Ursula Alert, MD 09/15/2018, 1:02 PM

## 2018-11-15 ENCOUNTER — Other Ambulatory Visit: Payer: Self-pay

## 2018-11-15 ENCOUNTER — Encounter: Payer: Self-pay | Admitting: Psychiatry

## 2018-11-15 ENCOUNTER — Ambulatory Visit (INDEPENDENT_AMBULATORY_CARE_PROVIDER_SITE_OTHER): Payer: Medicare Other | Admitting: Psychiatry

## 2018-11-15 DIAGNOSIS — F5105 Insomnia due to other mental disorder: Secondary | ICD-10-CM

## 2018-11-15 DIAGNOSIS — F4310 Post-traumatic stress disorder, unspecified: Secondary | ICD-10-CM | POA: Diagnosis not present

## 2018-11-15 DIAGNOSIS — F331 Major depressive disorder, recurrent, moderate: Secondary | ICD-10-CM | POA: Diagnosis not present

## 2018-11-15 DIAGNOSIS — F410 Panic disorder [episodic paroxysmal anxiety] without agoraphobia: Secondary | ICD-10-CM

## 2018-11-15 MED ORDER — ZOLPIDEM TARTRATE ER 12.5 MG PO TBCR: EXTENDED_RELEASE_TABLET | ORAL | 3 refills | Status: DC

## 2018-11-15 NOTE — Progress Notes (Signed)
Virtual Visit via Video Note  I connected with [name] on 11/15/18 at  8:30 AM EDT by a video enabled telemedicine application and verified that I am speaking with the correct person using two identifiers.   I discussed the limitations of evaluation and management by telemedicine and the availability of in person appointments. The patient expressed understanding and agreed to proceed.   I discussed the assessment and treatment plan with the patient. The patient was provided an opportunity to ask questions and all were answered. The patient agreed with the plan and demonstrated an understanding of the instructions.   The patient was advised to call back or seek an in-person evaluation if the symptoms worsen or if the condition fails to improve as anticipated.   [facility] MD OP Progress Note  11/15/2018 12:33 PM [name]  MRN:  [id number]  Chief Complaint:  Chief Complaint    Follow-up     HPI: [name] is a 43 year old Caucasian female, divorced, on SSD, lives in [place], has a history of MDD, PTSD, panic attacks, insomnia was evaluated by telemedicine today.  Patient today reports that she has been having some depressive episodes recently.  She reports she feels like she cannot express any emotions anymore.  She also has been withdrawing from other people.  She does not like to be in crowds anymore.  She reports this has been going on for a while and she does not think it is a side effect of her medications that she is on right now.  She is compliant on her Wellbutrin as well as her Viibryd.  Patient was referred for psychotherapy sessions and reports today that she called her therapist this morning for an appointment.  Discussed with patient that her medications can be readjusted.  Discussed possibly tapering of Viibryd and starting a new medication.  She reports she wants to try  psychotherapy sessions initially and then will let writer know if she is  interested.  She denies any suicidality, homicidality or perceptual disturbances. Visit Diagnosis:    ICD-10-CM   1. MDD (major depressive disorder), recurrent episode, moderate (HCC)  F33.1 zolpidem (AMBIEN CR) 12.5 MG CR tablet  2. Panic disorder  F41.0 zolpidem (AMBIEN CR) 12.5 MG CR tablet  3. PTSD (post-traumatic stress disorder)  F43.10 zolpidem (AMBIEN CR) 12.5 MG CR tablet  4. Insomnia due to mental condition  F51.05 zolpidem (AMBIEN CR) 12.5 MG CR tablet    Past Psychiatric History: I have reviewed past psychiatric history from my progress note on 05/09/2017.  Past Medical History:  Past Medical History:  Diagnosis Date  . Abnormal ECG   . Anemia   . Anxiety   . Arthritis   . B12 deficiency   . Cardiomyopathy ([place])   . Chronic abdominal pain   . Complication of anesthesia    difficulty to get sedated during endoscopy  . DDD (degenerative disc disease), lumbar   . Depression   . Diabetes mellitus without complication ([place])   . Dysrhythmia   . GERD (gastroesophageal reflux disease)   . Headache   . Iron deficiency   . Neuropathy   . Obesity   . Ovarian cyst   . Pneumonia    within past five years  . PTSD (post-traumatic stress disorder)   . PTSD (post-traumatic stress disorder)   . Rh negative status during pregnancy   . Seizures ([place])   . Small bowel obstruction [facility])     Past Surgical History:  Procedure Laterality Date  .  ABDOMINAL HYSTERECTOMY    . BILATERAL SALPINGECTOMY Bilateral 09/29/2015   Procedure: BILATERAL SALPINGECTOMY;  Surgeon: Benjaman Kindler, MD;  Location: [facility] ORS;  Service: Gynecology;  Laterality: Bilateral;  . bowel obstruction  2011  . CHOLECYSTECTOMY    . COLONOSCOPY WITH PROPOFOL N/A 01/24/2018   Procedure: COLONOSCOPY WITH PROPOFOL;  Surgeon: Toledo, Benay Pike, MD;  Location: [facility] ENDOSCOPY;  Service: Gastroenterology;  Laterality: N/A;  . DILATION AND CURETTAGE OF UTERUS    . ESOPHAGOGASTRODUODENOSCOPY    .  ESOPHAGOGASTRODUODENOSCOPY (EGD) WITH PROPOFOL N/A 01/24/2018   Procedure: ESOPHAGOGASTRODUODENOSCOPY (EGD) WITH PROPOFOL;  Surgeon: Toledo, Benay Pike, MD;  Location: [facility] ENDOSCOPY;  Service: Gastroenterology;  Laterality: N/A;  . GASTRIC BYPASS    . gi bleed  2009   Surgery-was in ICU for three weeks  . HERNIA REPAIR    . OVARIAN CYST REMOVAL Left 09/29/2015   Procedure: OVARIAN CYSTECTOMY;  Surgeon: Benjaman Kindler, MD;  Location: [facility] ORS;  Service: Gynecology;  Laterality: Left;  . ROUX-EN-Y PROCEDURE    . VAGINAL HYSTERECTOMY N/A 09/29/2015   Procedure: HYSTERECTOMY VAGINAL;  Surgeon: Benjaman Kindler, MD;  Location: [facility] ORS;  Service: Gynecology;  Laterality: N/A;    Family Psychiatric History: I have reviewed family psychiatric history from my progress note on 05/09/2017.  Family History:  Family History  Problem Relation Age of Onset  . Arthritis/Rheumatoid Mother   . Heart block Mother   . Clotting disorder Mother   . Osteoporosis Mother   . Heart attack Mother   . Anxiety disorder Mother   . Depression Mother   . Heart disease Father   . Heart attack Father   . Depression Sister   . Anxiety disorder Sister     Social History: I have reviewed social history from my progress note on 05/09/2017. Social History   Socioeconomic History  . Marital status: Single    Spouse name: Not on file  . Number of children: Not on file  . Years of education: Not on file  . Highest education level: Not on file  Occupational History  . Not on file  Social Needs  . Financial resource strain: Not on file  . Food insecurity    Worry: Not on file    Inability: Not on file  . Transportation needs    Medical: Not on file    Non-medical: Not on file  Tobacco Use  . Smoking status: Never Smoker  . Smokeless tobacco: Never Used  Substance and Sexual Activity  . Alcohol use: No    Alcohol/week: 0.0 standard drinks  . Drug use: No  . Sexual activity: Yes    Partners: Male    Birth  control/protection: Surgical  Lifestyle  . Physical activity    Days per week: Not on file    Minutes per session: Not on file  . Stress: Not on file  Relationships  . Social Herbalist on phone: Not on file    Gets together: Not on file    Attends religious service: Not on file    Active member of club or organization: Not on file    Attends meetings of clubs or organizations: Not on file    Relationship status: Not on file  Other Topics Concern  . Not on file  Social History Narrative  . Not on file    Allergies:  Allergies  Allergen Reactions  . Amoxicillin Anaphylaxis, Hives, Shortness Of Breath, Swelling and Rash    Has patient had a  PCN reaction causing immediate rash, facial/tongue/throat swelling, SOB or lightheadedness with hypotension: Yes Has patient had a PCN reaction causing severe rash involving mucus membranes or skin necrosis: Yes Has patient had a PCN reaction that required hospitalization No Has patient had a PCN reaction occurring within the last 10 years: No If all of the above answers are "NO", then may proceed with Cephalosporin use. Other reaction(s): ANAPHYLAXIS Other reaction(s): ANAPHYLAX  . Penicillins Anaphylaxis, Swelling and Rash    Has patient had a PCN reaction causing immediate rash, facial/tongue/throat swelling, SOB or lightheadedness with hypotension: Yes Has patient had a PCN reaction causing severe rash involving mucus membranes or skin necrosis: Yes Has patient had a PCN reaction that required hospitalization No Has patient had a PCN reaction occurring within the last 10 years: No If all of the above answers are "NO", then may proceed with Cephalosporin use.   [name] Morphine Other (See Comments)    Muscle spasms  . Sulfa Antibiotics     Other reaction(s): VOMITING    Metabolic Disorder Labs: Lab Results  Component Value Date   HGBA1C 4.8 03/23/2014   No results found for: PROLACTIN No results found for: CHOL, TRIG, HDL,  CHOLHDL, VLDL, LDLCALC Lab Results  Component Value Date   TSH  05/13/2009    3.163 (NOTE) Please note change in reference ranges for ages 60W to 43Y. Test methodology is 3rd generation TSH   TSH 1.503 Test methodology is 3rd generation TSH 09/05/2008    Therapeutic Level Labs: No results found for: LITHIUM No results found for: VALPROATE No components found for:  CBMZ  Current Medications: Current Outpatient Medications  Medication Sig Dispense Refill  . gabapentin (NEURONTIN) 100 MG capsule Take by mouth.    [name] acetaminophen (TYLENOL) 325 MG tablet Take 650 mg by mouth every 4 (four) hours as needed for moderate pain.     [name] buPROPion (WELLBUTRIN XL) 150 MG 24 hr tablet TAKE 2 TABLETS(300 MG) BY MOUTH DAILY 60 tablet 3  . cyanocobalamin (,VITAMIN B-12,) 1000 MCG/ML injection Inject 1,000 mcg into the muscle every 30 (thirty) days.     . diphenoxylate-atropine (LOMOTIL) 2.5-0.025 MG tablet     . gabapentin (NEURONTIN) 100 MG capsule     . ibuprofen (ADVIL,MOTRIN) 600 MG tablet Take 1 tablet (600 mg total) by mouth every 6 (six) hours as needed. 20 tablet 0  . metoprolol succinate (TOPROL-XL) 25 MG 24 hr tablet Take by mouth.    . Multiple Vitamins-Minerals (HM MULTIVITAMIN ADULT GUMMY) CHEW Chew 2 tablets by mouth daily.    . ondansetron (ZOFRAN ODT) 4 MG disintegrating tablet Take 1 tablet (4 mg total) by mouth every 8 (eight) hours as needed. 20 tablet 0  . SUMAtriptan (IMITREX) 100 MG tablet Take 100 mg by mouth every 2 (two) hours as needed for migraine or headache.     . thiamine (VITAMIN B-1) 100 MG tablet Take 100 mg by mouth.    . topiramate (TOPAMAX) 50 MG tablet TAKE 1 TABLET BY MOUTH IN THE MORNING, AND 2 TABLETS EVERY NIGHT AT BEDTIME    . traMADol (ULTRAM) 50 MG tablet TAKE 50-100 MG EVERY 4 HOURS AS NEEDED FOR PAIN NO MORE THAN 8 TABS IN 24 HOURS  0  . Vilazodone HCl (VIIBRYD) 40 MG TABS Take 1 tablet (40 mg total) by mouth daily. 30 tablet 1  . [START ON 11/29/2018]  zolpidem (AMBIEN CR) 12.5 MG CR tablet TAKE 1 TABLET(12.5 MG) BY MOUTH AT BEDTIME AS NEEDED  FOR SLEEP 30 tablet 3   No current facility-administered medications for this visit.      Musculoskeletal: Strength & Muscle Tone: UTA Gait & Station: normal Patient leans: N/A  Psychiatric Specialty Exam: Review of Systems  Psychiatric/Behavioral: Positive for depression.  All other systems reviewed and are negative.   There were no vitals taken for this visit.There is no height or weight on file to calculate BMI.  General Appearance: Casual  Eye Contact:  Fair  Speech:  Clear and Coherent  Volume:  Normal  Mood:  Depressed  Affect:  Congruent  Thought Process:  Goal Directed and Descriptions of Associations: Intact  Orientation:  Full (Time, Place, and Person)  Thought Content: Logical   Suicidal Thoughts:  No  Homicidal Thoughts:  No  Memory:  Immediate;   Fair Recent;   Fair Remote;   Fair  Judgement:  Fair  Insight:  Fair  Psychomotor Activity:  Normal  Concentration:  Concentration: Fair and Attention Span: Fair  Recall:  [employer] of Knowledge: Fair  Language: Fair  Akathisia:  No  Handed:  Right  AIMS (if indicated):Denies tremors, rigidity  Assets:  Communication Skills Desire for Improvement Housing  ADL's:  Intact  Cognition: WNL  Sleep:  Fair   Screenings:   Assessment and Plan: Ellisha is a 43 year old Caucasian female who has a history of depression, anxiety, insomnia was evaluated by telemedicine today.  Patient currently reports depressive symptoms.  She however wants to start psychotherapy sessions.  Will monitor closely.  Plan as discussed below.  Plan MDD-unstable Wellbutrin XL 300 mg p.o. daily-reduced dosage Viibryd 40 mg p.o. daily  For anxiety symptoms- improving Propranolol 10 mg p.o. 3 times daily as needed Viibryd 40 mg p.o. daily   For insomnia-stable Ambien CR 12.5 mg p.o. nightly  Patient was referred to Ms. Berlin Hun will  restart psychotherapy sessions.  She reports she called Otila Kluver and is waiting to hear back.  Follow-up in clinic in 2 weeks or sooner if needed.  October 14 at 4:15 PM  I have spent atleast 15 minutes non  face to face with patient today. More than 50 % of the time was spent for psychoeducation and supportive psychotherapy and care coordination. This note was generated in part or whole with voice recognition software. Voice recognition is usually quite accurate but there are transcription errors that can and very often do occur. I apologize for any typographical errors that were not detected and corrected.      Ursula Alert, MD 11/15/2018, 12:33 PM

## 2018-11-26 ENCOUNTER — Other Ambulatory Visit: Payer: Self-pay | Admitting: Psychiatry

## 2018-11-26 DIAGNOSIS — F331 Major depressive disorder, recurrent, moderate: Secondary | ICD-10-CM

## 2018-11-26 DIAGNOSIS — F410 Panic disorder [episodic paroxysmal anxiety] without agoraphobia: Secondary | ICD-10-CM

## 2018-11-29 ENCOUNTER — Other Ambulatory Visit: Payer: Self-pay

## 2018-11-29 ENCOUNTER — Ambulatory Visit (INDEPENDENT_AMBULATORY_CARE_PROVIDER_SITE_OTHER): Payer: Medicare Other | Admitting: Psychiatry

## 2018-11-29 ENCOUNTER — Encounter: Payer: Self-pay | Admitting: Psychiatry

## 2018-11-29 DIAGNOSIS — F410 Panic disorder [episodic paroxysmal anxiety] without agoraphobia: Secondary | ICD-10-CM | POA: Diagnosis not present

## 2018-11-29 DIAGNOSIS — F5105 Insomnia due to other mental disorder: Secondary | ICD-10-CM

## 2018-11-29 DIAGNOSIS — F4310 Post-traumatic stress disorder, unspecified: Secondary | ICD-10-CM | POA: Diagnosis not present

## 2018-11-29 DIAGNOSIS — F331 Major depressive disorder, recurrent, moderate: Secondary | ICD-10-CM | POA: Diagnosis not present

## 2018-11-29 MED ORDER — HYDROXYZINE HCL 25 MG PO TABS: 12.5000 mg | ORAL_TABLET | Freq: Two times a day (BID) | ORAL | 1 refills | Status: DC | PRN

## 2018-11-29 MED ORDER — LAMOTRIGINE 25 MG PO TABS: 25.0000 mg | ORAL_TABLET | Freq: Every day | ORAL | 1 refills | Status: DC

## 2018-11-29 NOTE — Progress Notes (Signed)
Virtual Visit via Video Note  I connected with [name] on 11/29/18 at  4:15 PM EDT by a video enabled telemedicine application and verified that I am speaking with the correct person using two identifiers.   I discussed the limitations of evaluation and management by telemedicine and the availability of in person appointments. The patient expressed understanding and agreed to proceed.   I discussed the assessment and treatment plan with the patient. The patient was provided an opportunity to ask questions and all were answered. The patient agreed with the plan and demonstrated an understanding of the instructions.   The patient was advised to call back or seek an in-person evaluation if the symptoms worsen or if the condition fails to improve as anticipated.   [facility] MD OP Progress Note  11/29/2018 5:19 PM TREINA MANTE  MRN:  [id number]  Chief Complaint:  Chief Complaint    Follow-up     HPI: [name] is a 43 year old Caucasian female, divorced, on SSD, lives in [place], has a history of MDD, PTSD, panic attacks, insomnia was evaluated by telemedicine today.  Patient today reports she is currently struggling with a lot of anxiety symptoms.  She reports she goes through episodes of feeling sad and then feeling good for a few days.  She also reports a lot of racing thoughts.  As soon as he wakes up in the morning she has constant racing thoughts about different things.  She reports she is unable to control them.  This is affecting her ability to function during the day.  She reports she worries about her future, her kids future, financial problems, health insurance problems, her daughter's school and so on.  She reports she did go through an episode of racing thoughts in the past and now she does not remember how she was able to control it at that time.  Patient reports sleep is good.  She continues to take Ambien.  Patient is compliant on her medications as  prescribed.  Patient reports she is trying to reach Ms. Miguel Dibble however they have not been able to connect with each other yet.  She would definitely want to start psychotherapy sessions again.  She reports she is interested in medication changes however she does not want to be on any medication that can cause her weight gain.  She reports she is quite worried about that and struggles with a lot of low self-esteem related to her body image.  Patient denies any suicidality, homicidality or perceptual disturbances.     Visit Diagnosis:    ICD-10-CM   1. MDD (major depressive disorder), recurrent episode, moderate (HCC)  F33.1 lamoTRIgine (LAMICTAL) 25 MG tablet  2. Panic disorder  F41.0 hydrOXYzine (ATARAX/VISTARIL) 25 MG tablet  3. PTSD (post-traumatic stress disorder)  F43.10   4. Insomnia due to mental condition  F51.05     Past Psychiatric History: I reviewed past psychiatric history from my progress note on 05/09/2017.  Past Medical History:  Past Medical History:  Diagnosis Date  . Abnormal ECG   . Anemia   . Anxiety   . Arthritis   . B12 deficiency   . Cardiomyopathy ([place])   . Chronic abdominal pain   . Complication of anesthesia    difficulty to get sedated during endoscopy  . DDD (degenerative disc disease), lumbar   . Depression   . Diabetes mellitus without complication ([place])   . Dysrhythmia   . GERD (gastroesophageal reflux disease)   . Headache   .  Iron deficiency   . Neuropathy   . Obesity   . Ovarian cyst   . Pneumonia    within past five years  . PTSD (post-traumatic stress disorder)   . PTSD (post-traumatic stress disorder)   . Rh negative status during pregnancy   . Seizures ([place])   . Small bowel obstruction [facility])     Past Surgical History:  Procedure Laterality Date  . ABDOMINAL HYSTERECTOMY    . BILATERAL SALPINGECTOMY Bilateral 09/29/2015   Procedure: BILATERAL SALPINGECTOMY;  Surgeon: Benjaman Kindler, MD;  Location: [facility] ORS;  Service:  Gynecology;  Laterality: Bilateral;  . bowel obstruction  2011  . CHOLECYSTECTOMY    . COLONOSCOPY WITH PROPOFOL N/A 01/24/2018   Procedure: COLONOSCOPY WITH PROPOFOL;  Surgeon: Toledo, Benay Pike, MD;  Location: [facility] ENDOSCOPY;  Service: Gastroenterology;  Laterality: N/A;  . DILATION AND CURETTAGE OF UTERUS    . ESOPHAGOGASTRODUODENOSCOPY    . ESOPHAGOGASTRODUODENOSCOPY (EGD) WITH PROPOFOL N/A 01/24/2018   Procedure: ESOPHAGOGASTRODUODENOSCOPY (EGD) WITH PROPOFOL;  Surgeon: Toledo, Benay Pike, MD;  Location: [facility] ENDOSCOPY;  Service: Gastroenterology;  Laterality: N/A;  . GASTRIC BYPASS    . gi bleed  2009   Surgery-was in ICU for three weeks  . HERNIA REPAIR    . OVARIAN CYST REMOVAL Left 09/29/2015   Procedure: OVARIAN CYSTECTOMY;  Surgeon: Benjaman Kindler, MD;  Location: [facility] ORS;  Service: Gynecology;  Laterality: Left;  . ROUX-EN-Y PROCEDURE    . VAGINAL HYSTERECTOMY N/A 09/29/2015   Procedure: HYSTERECTOMY VAGINAL;  Surgeon: Benjaman Kindler, MD;  Location: [facility] ORS;  Service: Gynecology;  Laterality: N/A;    Family Psychiatric History: Reviewed family psychiatric history from my progress note on 05/09/2017.  Family History:  Family History  Problem Relation Age of Onset  . Arthritis/Rheumatoid Mother   . Heart block Mother   . Clotting disorder Mother   . Osteoporosis Mother   . Heart attack Mother   . Anxiety disorder Mother   . Depression Mother   . Heart disease Father   . Heart attack Father   . Depression Sister   . Anxiety disorder Sister     Social History: Reviewed social history from my progress note on 05/09/2017. Social History   Socioeconomic History  . Marital status: Single    Spouse name: Not on file  . Number of children: Not on file  . Years of education: Not on file  . Highest education level: Not on file  Occupational History  . Not on file  Social Needs  . Financial resource strain: Not on file  . Food insecurity    Worry: Not on file     Inability: Not on file  . Transportation needs    Medical: Not on file    Non-medical: Not on file  Tobacco Use  . Smoking status: Never Smoker  . Smokeless tobacco: Never Used  Substance and Sexual Activity  . Alcohol use: No    Alcohol/week: 0.0 standard drinks  . Drug use: No  . Sexual activity: Yes    Partners: Male    Birth control/protection: Surgical  Lifestyle  . Physical activity    Days per week: Not on file    Minutes per session: Not on file  . Stress: Not on file  Relationships  . Social Herbalist on phone: Not on file    Gets together: Not on file    Attends religious service: Not on file    Active member of club or organization:  Not on file    Attends meetings of clubs or organizations: Not on file    Relationship status: Not on file  Other Topics Concern  . Not on file  Social History Narrative  . Not on file    Allergies:  Allergies  Allergen Reactions  . Amoxicillin Anaphylaxis, Hives, Shortness Of Breath, Swelling and Rash    Has patient had a PCN reaction causing immediate rash, facial/tongue/throat swelling, SOB or lightheadedness with hypotension: Yes Has patient had a PCN reaction causing severe rash involving mucus membranes or skin necrosis: Yes Has patient had a PCN reaction that required hospitalization No Has patient had a PCN reaction occurring within the last 10 years: No If all of the above answers are "NO", then may proceed with Cephalosporin use. Other reaction(s): ANAPHYLAXIS Other reaction(s): ANAPHYLAX  . Penicillins Anaphylaxis, Swelling and Rash    Has patient had a PCN reaction causing immediate rash, facial/tongue/throat swelling, SOB or lightheadedness with hypotension: Yes Has patient had a PCN reaction causing severe rash involving mucus membranes or skin necrosis: Yes Has patient had a PCN reaction that required hospitalization No Has patient had a PCN reaction occurring within the last 10 years: No If all of the  above answers are "NO", then may proceed with Cephalosporin use.   [name] Morphine Other (See Comments)    Muscle spasms  . Sulfa Antibiotics     Other reaction(s): VOMITING    Metabolic Disorder Labs: Lab Results  Component Value Date   HGBA1C 4.8 03/23/2014   No results found for: PROLACTIN No results found for: CHOL, TRIG, HDL, CHOLHDL, VLDL, LDLCALC Lab Results  Component Value Date   TSH  05/13/2009    3.163 (NOTE)  Please note change in reference ranges for ages 23W to 57Y. Test methodology is 3rd generation TSH   TSH 1.503 Test methodology is 3rd generation TSH 09/05/2008    Therapeutic Level Labs: No results found for: LITHIUM No results found for: VALPROATE No components found for:  CBMZ  Current Medications: Current Outpatient Medications  Medication Sig Dispense Refill  . acetaminophen (TYLENOL) 325 MG tablet Take 650 mg by mouth every 4 (four) hours as needed for moderate pain.     [name] buPROPion (WELLBUTRIN XL) 150 MG 24 hr tablet TAKE 2 TABLETS(300 MG) BY MOUTH DAILY 60 tablet 3  . cyanocobalamin (,VITAMIN B-12,) 1000 MCG/ML injection Inject 1,000 mcg into the muscle every 30 (thirty) days.     . diphenoxylate-atropine (LOMOTIL) 2.5-0.025 MG tablet     . gabapentin (NEURONTIN) 100 MG capsule Take by mouth.    . gabapentin (NEURONTIN) 100 MG capsule     . hydrOXYzine (ATARAX/VISTARIL) 25 MG tablet Take 0.5-1 tablets (12.5-25 mg total) by mouth 2 (two) times daily as needed. For anxiety 60 tablet 1  . ibuprofen (ADVIL,MOTRIN) 600 MG tablet Take 1 tablet (600 mg total) by mouth every 6 (six) hours as needed. 20 tablet 0  . lamoTRIgine (LAMICTAL) 25 MG tablet Take 1-2 tablets (25-50 mg total) by mouth daily. Start 1 tablet daily for 2 weeks and increase to 2 tablets daily after that. 60 tablet 1  . metoprolol succinate (TOPROL-XL) 25 MG 24 hr tablet Take by mouth.    . Multiple Vitamins-Minerals (HM MULTIVITAMIN ADULT GUMMY) CHEW Chew 2 tablets by mouth daily.    .  ondansetron (ZOFRAN ODT) 4 MG disintegrating tablet Take 1 tablet (4 mg total) by mouth every 8 (eight) hours as needed. 20 tablet 0  . SUMAtriptan (IMITREX)  100 MG tablet Take 100 mg by mouth every 2 (two) hours as needed for migraine or headache.     . thiamine (VITAMIN B-1) 100 MG tablet Take 100 mg by mouth.    . topiramate (TOPAMAX) 50 MG tablet TAKE 1 TABLET BY MOUTH IN THE MORNING, AND 2 TABLETS EVERY NIGHT AT BEDTIME    . traMADol (ULTRAM) 50 MG tablet TAKE 50-100 MG EVERY 4 HOURS AS NEEDED FOR PAIN NO MORE THAN 8 TABS IN 24 HOURS  0  . VIIBRYD 40 MG TABS TAKE 1 TABLET(40 MG) BY MOUTH DAILY 30 tablet 1  . zolpidem (AMBIEN CR) 12.5 MG CR tablet TAKE 1 TABLET(12.5 MG) BY MOUTH AT BEDTIME AS NEEDED FOR SLEEP 30 tablet 3   No current facility-administered medications for this visit.      Musculoskeletal: Strength & Muscle Tone: UTA Gait & Station: normal Patient leans: N/A  Psychiatric Specialty Exam: Review of Systems  Psychiatric/Behavioral: Positive for depression. The patient is nervous/anxious.   All other systems reviewed and are negative.   There were no vitals taken for this visit.There is no height or weight on file to calculate BMI.  General Appearance: Casual  Eye Contact:  Fair  Speech:  Clear and Coherent  Volume:  Normal  Mood:  Anxious and Depressed  Affect:  Congruent  Thought Process:  Goal Directed and Descriptions of Associations: Intact  Orientation:  Full (Time, Place, and Person)  Thought Content: Logical   Suicidal Thoughts:  No  Homicidal Thoughts:  No  Memory:  Immediate;   Fair Recent;   Fair Remote;   Fair  Judgement:  Fair  Insight:  Fair  Psychomotor Activity:  Normal  Concentration:  Concentration: Fair and Attention Span: Fair  Recall:  [employer] of Knowledge: Fair  Language: Fair  Akathisia:  No  Handed:  Right  AIMS (if indicated): Denies tremors, rigidity  Assets:  Communication Skills Desire for Improvement Housing Social  Support  ADL's:  Intact  Cognition: WNL  Sleep:  Fair   Screenings:   Assessment and Plan: [name] is a 43 year old Caucasian female who has a history of depression, anxiety, insomnia was evaluated by telemedicine today.  Patient continues to struggle with mood symptoms, racing thoughts and worrying.  Patient will benefit from medication readjustment as well as psychotherapy sessions.    Plan MDD-unstable Wellbutrin XL 300 mg p.o. daily-reduced dosage Viibryd 40 mg p.o. daily Add Lamictal 25 mg po daily for 2 weeks and increase to 50 mg.  Anxiety symptoms-unstable Start Vistaril 12.5 - 25 mg po bid prn for severe anxiety. Viibryd 40 mg p.o. daily  Insomnia-stable Ambien CR 12.5 mg p.o. nightly  Restart CBT with Ms. Miguel Dibble.With patient in session , called Ms.Grandville Silos and left message to coordinate care .  Follow-up in clinic in 3 to 4 weeks or sooner if needed.Nov 13, 11 am   I have spent atleast 25 minutes non face to face with patient today. More than 50 % of the time was spent for psychoeducation and supportive psychotherapy and care coordination. This note was generated in part or whole with voice recognition software. Voice recognition is usually quite accurate but there are transcription errors that can and very often do occur. I apologize for any typographical errors that were not detected and corrected.       Ursula Alert, MD 11/29/2018, 5:19 PM

## 2018-12-29 ENCOUNTER — Other Ambulatory Visit: Payer: Self-pay

## 2018-12-29 ENCOUNTER — Encounter: Payer: Self-pay | Admitting: Psychiatry

## 2018-12-29 ENCOUNTER — Ambulatory Visit (INDEPENDENT_AMBULATORY_CARE_PROVIDER_SITE_OTHER): Payer: Medicare Other | Admitting: Psychiatry

## 2018-12-29 DIAGNOSIS — F410 Panic disorder [episodic paroxysmal anxiety] without agoraphobia: Secondary | ICD-10-CM

## 2018-12-29 DIAGNOSIS — F4310 Post-traumatic stress disorder, unspecified: Secondary | ICD-10-CM

## 2018-12-29 DIAGNOSIS — F5105 Insomnia due to other mental disorder: Secondary | ICD-10-CM | POA: Diagnosis not present

## 2018-12-29 DIAGNOSIS — F331 Major depressive disorder, recurrent, moderate: Secondary | ICD-10-CM

## 2018-12-29 MED ORDER — VIIBRYD 40 MG PO TABS: ORAL_TABLET | ORAL | 1 refills | Status: DC

## 2018-12-29 MED ORDER — LAMOTRIGINE 25 MG PO TABS: 75.0000 mg | ORAL_TABLET | ORAL | 1 refills | Status: DC

## 2018-12-29 MED ORDER — BUPROPION HCL ER (XL) 150 MG PO TB24: ORAL_TABLET | ORAL | 3 refills | Status: DC

## 2018-12-29 NOTE — Progress Notes (Signed)
Virtual Visit via Video Note  I connected with [name] on 12/29/18 at 11:00 AM EST by a video enabled telemedicine application and verified that I am speaking with the correct person using two identifiers.   I discussed the limitations of evaluation and management by telemedicine and the availability of in person appointments. The patient expressed understanding and agreed to proceed.     I discussed the assessment and treatment plan with the patient. The patient was provided an opportunity to ask questions and all were answered. The patient agreed with the plan and demonstrated an understanding of the instructions.   The patient was advised to call back or seek an in-person evaluation if the symptoms worsen or if the condition fails to improve as anticipated.   [facility] MD OP Progress Note  12/29/2018 12:13 PM [name]  MRN:  [id number]  Chief Complaint:  Chief Complaint    Follow-up     HPI: [name] is a 43 year old Caucasian female, divorced, on SSD, lives in [place], has a history of MDD, PTSD, panic attacks, insomnia was evaluated by telemedicine today.  Patient today reports she is currently struggling with depression and anxiety symptoms.  The Lamictal may have helped some however she continues to struggle with sadness, racing thoughts.  She has several psychosocial stressors including her chronic pain as well as financial problems.  She constantly worries about her financial situation.  She reports she is worried about her daughter's school.  Her pre-k is going to shut down in December and she has to find a new one.  She reports she does get disability however her daughter's father does not help much financially.  He pays child support on and off.  Patient reports sleep continues to be okay on the Ambien.  Patient reports she has not been able to start psychotherapy sessions yet.  She reports her therapist had to reschedule and she was unable to find another  appointment soon.  Patient denies any suicidality, homicidality or perceptual disturbances.  Patient denies any other concerns today.   Visit Diagnosis:    ICD-10-CM   1. MDD (major depressive disorder), recurrent episode, moderate (HCC)  F33.1 lamoTRIgine (LAMICTAL) 25 MG tablet  2. Panic disorder  F41.0 Vilazodone HCl (VIIBRYD) 40 MG TABS  3. PTSD (post-traumatic stress disorder)  F43.10 Vilazodone HCl (VIIBRYD) 40 MG TABS    buPROPion (WELLBUTRIN XL) 150 MG 24 hr tablet  4. Insomnia due to mental condition  F51.05     Past Psychiatric History: Reviewed past psychiatric history from my progress note on 05/09/2017  Past Medical History:  Past Medical History:  Diagnosis Date  . Abnormal ECG   . Anemia   . Anxiety   . Arthritis   . B12 deficiency   . Cardiomyopathy ([place])   . Chronic abdominal pain   . Complication of anesthesia    difficulty to get sedated during endoscopy  . DDD (degenerative disc disease), lumbar   . Depression   . Diabetes mellitus without complication ([place])   . Dysrhythmia   . GERD (gastroesophageal reflux disease)   . Headache   . Iron deficiency   . Neuropathy   . Obesity   . Ovarian cyst   . Pneumonia    within past five years  . PTSD (post-traumatic stress disorder)   . PTSD (post-traumatic stress disorder)   . Rh negative status during pregnancy   . Seizures ([place])   . Small bowel obstruction (HCC)     Past  Surgical History:  Procedure Laterality Date  . ABDOMINAL HYSTERECTOMY    . BILATERAL SALPINGECTOMY Bilateral 09/29/2015   Procedure: BILATERAL SALPINGECTOMY;  Surgeon: Benjaman Kindler, MD;  Location: [facility] ORS;  Service: Gynecology;  Laterality: Bilateral;  . bowel obstruction  2011  . CHOLECYSTECTOMY    . COLONOSCOPY WITH PROPOFOL N/A 01/24/2018   Procedure: COLONOSCOPY WITH PROPOFOL;  Surgeon: Toledo, Benay Pike, MD;  Location: [facility] END[facility];  Service: Gastroenterology;  Laterality: N/A;  . DILATION AND CURETTAGE OF UTERUS    .  ESOPHAGOGASTRODUODENOSCOPY    . ESOPHAGOGASTRODUODENOSCOPY (EGD) WITH PROPOFOL N/A 01/24/2018   Procedure: ESOPHAGOGASTRODUODENOSCOPY (EGD) WITH PROPOFOL;  Surgeon: Toledo, Benay Pike, MD;  Location: [facility] END[facility];  Service: Gastroenterology;  Laterality: N/A;  . GASTRIC BYPASS    . gi bleed  2009   Surgery-was in ICU for three weeks  . HERNIA REPAIR    . OVARIAN CYST REMOVAL Left 09/29/2015   Procedure: OVARIAN CYSTECTOMY;  Surgeon: Benjaman Kindler, MD;  Location: [facility] OR[facility];  Service: Gynecology;  Laterality: Left;  . ROUX-EN-Y PROCEDURE    . VAGINAL HYSTERECTOMY N/A 09/29/2015   Procedure: HYSTERECTOMY VAGINAL;  Surgeon: Benjaman Kindler, MD;  Location: [facility] OR[facility];  Service: Gynecology;  Laterality: N/A;    Family Psychiatric History: Reviewed family psychiatric history from my progress note on 05/09/2017  Family History:  Family History  Problem Relation Age of Onset  . Arthritis/Rheumatoid Mother   . Heart block Mother   . Clotting disorder Mother   . Osteoporosis Mother   . Heart attack Mother   . Anxiety disorder Mother   . Depression Mother   . Heart disease Father   . Heart attack Father   . Depression Sister   . Anxiety disorder Sister     Social History: Reviewed social history from my progress note on 05/09/2017 Social History   Socioeconomic History  . Marital status: Single    Spouse name: Not on file  . Number of children: Not on file  . Years of education: Not on file  . Highest education level: Not on file  Occupational History  . Not on file  Social Needs  . Financial resource strain: Not on file  . Food insecurity    Worry: Not on file    Inability: Not on file  . Transportation needs    Medical: Not on file    Non-medical: Not on file  Tobacco Use  . Smoking status: Never Smoker  . Smokeless tobacco: Never Used  Substance and Sexual Activity  . Alcohol use: No    Alcohol/week: 0.0 standard drinks  . Drug use: No  . Sexual activity: Yes     Partners: Male    Birth control/protection: Surgical  Lifestyle  . Physical activity    Days per week: Not on file    Minutes per session: Not on file  . Stress: Not on file  Relationships  . Social Herbalist on phone: Not on file    Gets together: Not on file    Attends religious service: Not on file    Active member of club or organization: Not on file    Attends meetings of clubs or organizations: Not on file    Relationship status: Not on file  Other Topics Concern  . Not on file  Social History Narrative  . Not on file    Allergies:  Allergies  Allergen Reactions  . Amoxicillin Anaphylaxis, Hives, Shortness Of Breath, Swelling and Rash  Has patient had a PCN reaction causing immediate rash, facial/tongue/throat swelling, SOB or lightheadedness with hypotension: Yes Has patient had a PCN reaction causing severe rash involving mucus membranes or skin necrosis: Yes Has patient had a PCN reaction that required hospitalization No Has patient had a PCN reaction occurring within the last 10 years: No If all of the above answers are "NO", then may proceed with Cephalosporin use. Other reaction(s): ANAPHYLAXIS Other reaction(s): ANAPHYLAX  . Penicillins Anaphylaxis, Swelling, Rash, Shortness Of Breath and Hives    Has patient had a PCN reaction causing immediate rash, facial/tongue/throat swelling, SOB or lightheadedness with hypotension: Yes Has patient had a PCN reaction causing severe rash involving mucus membranes or skin necrosis: Yes Has patient had a PCN reaction that required hospitalization No Has patient had a PCN reaction occurring within the last 10 years: No If all of the above answers are "NO", then may proceed with Cephalosporin use.  Other reaction(s): ANAPHYLAXIS Other reaction(s): ANAPHYLAXIS Has patient had a PCN reaction causing immediate rash, facial/tongue/throat swelling, SOB or lightheadedness with hypotension: Yes Has patient had a PCN  reaction causing severe rash involving mucus membranes or skin necrosis: Yes Has patient had a PCN reaction that required hospitalization No Has patient had a PCN reaction occurring within the last 10 years: No If all of the above answers are "NO", then may proceed with Cephalosporin use. Other reaction(s): ANAPHYLAXIS Other reaction(s): ANAPHYLAX Has patient had a PCN reaction causing immediate rash, facial/tongue/throat swelling, SOB or lightheadedness with hypotension: Yes Has patient had a PCN reaction causing severe rash involving mucus membranes or skin necrosis: Yes Has patient had a PCN reaction that required hospitalization No Has patient had a PCN reaction occurring within the last 10 years: No If all of the above answers are "NO", then may proceed with Cephalosporin use. Other reaction(s): ANAPHYLAXIS Other reaction(s): ANAPHYLAXIS   . Morphine Other (See Comments)    Muscle spasms  . Sulfa Antibiotics Nausea Only    Other reaction(s): VOMITING Other reaction(s): VOMITING Other reaction(s): VOMITING    Metabolic Disorder Labs: Lab Results  Component Value Date   HGBA1C 4.8 03/23/2014   No results found for: PROLACTIN No results found for: CHOL, TRIG, HDL, CHOLHDL, VLDL, LDLCALC Lab Results  Component Value Date   TSH  05/13/2009    3.163 (NOTE) Please note change in reference ranges for ages 85W to 55Y. Test methodology is 3rd generation TSH   TSH 1.503 Test methodology is 3rd generation TSH 09/05/2008    Therapeutic Level Labs: No results found for: LITHIUM No results found for: VALPROATE No components found for:  CBMZ  Current Medications: Current Outpatient Medications  Medication Sig Dispense Refill  . acetaminophen (TYLENOL) 325 MG tablet Take 650 mg by mouth every 4 (four) hours as needed for moderate pain.     [name] buPROPion (WELLBUTRIN XL) 150 MG 24 hr tablet TAKE 2 TABLETS(300 MG) BY MOUTH DAILY 60 tablet 3  . cyanocobalamin (,VITAMIN B-12,) 1000 MCG/ML  injection Inject 1,000 mcg into the muscle every 30 (thirty) days.     . diphenoxylate-atropine (LOMOTIL) 2.5-0.025 MG tablet     . gabapentin (NEURONTIN) 300 MG capsule     . hydrOXYzine (ATARAX/VISTARIL) 25 MG tablet Take 0.5-1 tablets (12.5-25 mg total) by mouth 2 (two) times daily as needed. For anxiety 60 tablet 1  . ibuprofen (ADVIL,MOTRIN) 600 MG tablet Take 1 tablet (600 mg total) by mouth every 6 (six) hours as needed. 20 tablet 0  . lamoTRIgine (  LAMICTAL) 25 MG tablet Take 3 tablets (75 mg total) by mouth as directed. Start 1 tablet AM and 2 tablets PM 90 tablet 1  . Multiple Vitamins-Minerals (HM MULTIVITAMIN ADULT GUMMY) CHEW Chew 2 tablets by mouth daily.    . SUMAtriptan (IMITREX) 100 MG tablet Take 100 mg by mouth every 2 (two) hours as needed for migraine or headache.     . topiramate (TOPAMAX) 50 MG tablet TAKE 1 TABLET BY MOUTH IN THE MORNING, AND 2 TABLETS EVERY NIGHT AT BEDTIME    . Vilazodone HCl (VIIBRYD) 40 MG TABS TAKE 1 TABLET(40 MG) BY MOUTH DAILY 30 tablet 1  . zolpidem (AMBIEN CR) 12.5 MG CR tablet TAKE 1 TABLET(12.5 MG) BY MOUTH AT BEDTIME AS NEEDED FOR SLEEP 30 tablet 3  . gabapentin (NEURONTIN) 100 MG capsule Take by mouth.    . gabapentin (NEURONTIN) 100 MG capsule     . gabapentin (NEURONTIN) 300 MG capsule Take by mouth.    . metoprolol succinate (TOPROL-XL) 25 MG 24 hr tablet Take by mouth.    . ondansetron (ZOFRAN ODT) 4 MG disintegrating tablet Take 1 tablet (4 mg total) by mouth every 8 (eight) hours as needed. (Patient not taking: Reported on 12/29/2018) 20 tablet 0  . thiamine (VITAMIN B-1) 100 MG tablet Take 100 mg by mouth.    . traMADol (ULTRAM) 50 MG tablet TAKE 50-100 MG EVERY 4 HOURS AS NEEDED FOR PAIN NO MORE THAN 8 TABS IN 24 HOURS  0   No current facility-administered medications for this visit.      Musculoskeletal: Strength & Muscle Tone: UTA Gait & Station: normal Patient leans: N/A  Psychiatric Specialty Exam: Review of Systems   Musculoskeletal: Positive for joint pain.  Psychiatric/Behavioral: Positive for depression. The patient is nervous/anxious.   All other systems reviewed and are negative.   There were no vitals taken for this visit.There is no height or weight on file to calculate BMI.  General Appearance: Casual  Eye Contact:  Fair  Speech:  Normal Rate  Volume:  Normal  Mood:  Anxious and Dysphoric  Affect:  Congruent  Thought Process:  Goal Directed and Descriptions of Associations: Intact  Orientation:  Full (Time, Place, and Person)  Thought Content: Logical   Suicidal Thoughts:  No  Homicidal Thoughts:  No  Memory:  Immediate;   Fair Recent;   Fair Remote;   Fair  Judgement:  Fair  Insight:  Fair  Psychomotor Activity:  Normal  Concentration:  Concentration: Fair and Attention Span: Fair  Recall:  [employer] of Knowledge: Fair  Language: Fair  Akathisia:  No  Handed:  Right  AIMS (if indicated): Denies tremors, rigidity  Assets:  Communication Skills Desire for Improvement Housing Social Support  ADL's:  Intact  Cognition: WNL  Sleep:  Fair   Screenings:   Assessment and Plan: [name] is a 43 year old Caucasian female, has a history of depression, anxiety, insomnia was evaluated by telemedicine today.  Patient continues to struggle with depression and anxiety symptoms.  She does have several psychosocial stressors including financial problems, chronic pain and the pandemic.  Plan as noted below.  Plan MDD-unstable Wellbutrin XL 300 mg p.o. daily-reduced dosage Viibryd 40 mg p.o. daily Increase Lamictal to 75 mg p.o. daily in divided dosage  Anxiety-unstable Hydroxyzine 12.5-  25 mg p.o. twice daily as needed for severe anxiety Viibryd 40 mg p.o. daily Patient referred for CBT-patient advised to schedule an appointment with her therapist.  Insomnia-stable Ambien CR  12.5 mg p.o. nightly  Patient was advised to restart psychotherapy sessions with Ms. Miguel Dibble  Follow-up in clinic in 3 to 4 weeks or sooner if needed.  December 11 at 9:20 AM  I have spent atleast 15 minutes non face to face with patient today. More than 50 % of the time was spent for psychoeducation and supportive psychotherapy and care coordination. This note was generated in part or whole with voice recognition software. Voice recognition is usually quite accurate but there are transcription errors that can and very often do occur. I apologize for any typographical errors that were not detected and corrected.       Ursula Alert, MD 12/29/2018, 12:13 PM

## 2019-01-18 DIAGNOSIS — U071 COVID-19: Secondary | ICD-10-CM

## 2019-01-18 HISTORY — DX: COVID-19: U07.1

## 2019-01-21 ENCOUNTER — Other Ambulatory Visit: Payer: Self-pay | Admitting: Psychiatry

## 2019-01-21 DIAGNOSIS — F4310 Post-traumatic stress disorder, unspecified: Secondary | ICD-10-CM

## 2019-01-21 DIAGNOSIS — F410 Panic disorder [episodic paroxysmal anxiety] without agoraphobia: Secondary | ICD-10-CM

## 2019-01-26 ENCOUNTER — Ambulatory Visit (INDEPENDENT_AMBULATORY_CARE_PROVIDER_SITE_OTHER): Payer: Medicare Other | Admitting: Psychiatry

## 2019-01-26 ENCOUNTER — Encounter: Payer: Self-pay | Admitting: Psychiatry

## 2019-01-26 ENCOUNTER — Other Ambulatory Visit: Payer: Self-pay

## 2019-01-26 DIAGNOSIS — F410 Panic disorder [episodic paroxysmal anxiety] without agoraphobia: Secondary | ICD-10-CM

## 2019-01-26 DIAGNOSIS — F4310 Post-traumatic stress disorder, unspecified: Secondary | ICD-10-CM

## 2019-01-26 DIAGNOSIS — F5105 Insomnia due to other mental disorder: Secondary | ICD-10-CM

## 2019-01-26 DIAGNOSIS — F331 Major depressive disorder, recurrent, moderate: Secondary | ICD-10-CM

## 2019-01-26 NOTE — Progress Notes (Signed)
Virtual Visit via Video Note  I connected with [name] on 01/26/19 at  9:20 AM EST by a video enabled telemedicine application and verified that I am speaking with the correct person using two identifiers.   I discussed the limitations of evaluation and management by telemedicine and the availability of in person appointments. The patient expressed understanding and agreed to proceed.     I discussed the assessment and treatment plan with the patient. The patient was provided an opportunity to ask questions and all were answered. The patient agreed with the plan and demonstrated an understanding of the instructions.   The patient was advised to call back or seek an in-person evaluation if the symptoms worsen or if the condition fails to improve as anticipated. Craig MD OP Progress Note  01/26/2019 12:28 PM [name]  MRN:  [id number]  Chief Complaint:  Chief Complaint    Follow-up     HPI: [name] is a 43 year old Caucasian female, divorced, on SSD, lives in [place], has a history of MDD, PTSD, panic attacks, insomnia was evaluated by telemedicine today.  Patient today reports she was diagnosed with COVID-19 on December 3.  She reports her daughter got infected since she was exposed to her dad who got infected.  She reports her daughter seems to be doing well and does not have any symptoms.  She however reports she is struggling with fatigue, aches and pains all over her body, loss of sense of smell and taste, she has lost her appetite, she has congestion.  She reports she has been having trouble sleeping since the past few nights.  Usually the Ambien helps her to rest through the night however she has noticed that it does not help anymore.  Patient is anxious about the situation.  She denies any suicidality or homicidality or perceptual disturbances.  She reports she is trying to stay away from other people at this time.  Her son stays in the same house however he  stays in his room and uses a separate bathroom.  Patient reports she had to reschedule her appointment with Ms. Miguel Dibble since she was infected however they spoke on the phone couple of days ago.  Patient denies any other concerns today. Visit Diagnosis:    ICD-10-CM   1. MDD (major depressive disorder), recurrent episode, moderate (HCC)  F33.1   2. Panic disorder  F41.0   3. PTSD (post-traumatic stress disorder)  F43.10   4. Insomnia due to mental condition  F51.05     Past Psychiatric History: I have reviewed past psychiatric history from my progress note on 05/09/2017.  Past Medical History:  Past Medical History:  Diagnosis Date  . Abnormal ECG   . Anemia   . Anxiety   . Arthritis   . B12 deficiency   . Cardiomyopathy ([place])   . Chronic abdominal pain   . Complication of anesthesia    difficulty to get sedated during endoscopy  . COVID-19 01/18/2019  . DDD (degenerative disc disease), lumbar   . Depression   . Diabetes mellitus without complication ([place])   . Dysrhythmia   . GERD (gastroesophageal reflux disease)   . Headache   . Iron deficiency   . Neuropathy   . Obesity   . Ovarian cyst   . Pneumonia    within past five years  . PTSD (post-traumatic stress disorder)   . PTSD (post-traumatic stress disorder)   . Rh negative status during pregnancy   . Seizures (  [place])   . Small bowel obstruction ([place])     Past Surgical History:  Procedure Laterality Date  . ABDOMINAL HYSTERECTOMY    . BILATERAL SALPINGECTOMY Bilateral 09/29/2015   Procedure: BILATERAL SALPINGECTOMY;  Surgeon: Benjaman Kindler, MD;  Location: [facility] ORS;  Service: Gynecology;  Laterality: Bilateral;  . bowel obstruction  2011  . CHOLECYSTECTOMY    . COLONOSCOPY WITH PROPOFOL N/A 01/24/2018   Procedure: COLONOSCOPY WITH PROPOFOL;  Surgeon: Toledo, Benay Pike, MD;  Location: [facility] END[facility]COPY;  Service: Gastroenterology;  Laterality: N/A;  . DILATION AND CURETTAGE OF UTERUS    .  ESOPHAGOGASTRODUODENOSCOPY    . ESOPHAGOGASTRODUODENOSCOPY (EGD) WITH PROPOFOL N/A 01/24/2018   Procedure: ESOPHAGOGASTRODUODENOSCOPY (EGD) WITH PROPOFOL;  Surgeon: Toledo, Benay Pike, MD;  Location: [facility] END[facility]COPY;  Service: Gastroenterology;  Laterality: N/A;  . GASTRIC BYPASS    . gi bleed  2009   Surgery-was in ICU for three weeks  . HERNIA REPAIR    . OVARIAN CYST REMOVAL Left 09/29/2015   Procedure: OVARIAN CYSTECTOMY;  Surgeon: Benjaman Kindler, MD;  Location: [facility] OR[facility];  Service: Gynecology;  Laterality: Left;  . ROUX-EN-Y PROCEDURE    . VAGINAL HYSTERECTOMY N/A 09/29/2015   Procedure: HYSTERECTOMY VAGINAL;  Surgeon: Benjaman Kindler, MD;  Location: [facility] OR[facility];  Service: Gynecology;  Laterality: N/A;    Family Psychiatric History: I have reviewed family psychiatric history from my progress note on 05/09/2017.  Family History:  Family History  Problem Relation Age of Onset  . Arthritis/Rheumatoid Mother   . Heart block Mother   . Clotting disorder Mother   . Osteoporosis Mother   . Heart attack Mother   . Anxiety disorder Mother   . Depression Mother   . Heart disease Father   . Heart attack Father   . Depression Sister   . Anxiety disorder Sister     Social History: I have reviewed social history from my progress note on 05/09/2017. Social History   Socioeconomic History  . Marital status: Single    Spouse name: Not on file  . Number of children: Not on file  . Years of education: Not on file  . Highest education level: Not on file  Occupational History  . Not on file  Tobacco Use  . Smoking status: Never Smoker  . Smokeless tobacco: Never Used  Substance and Sexual Activity  . Alcohol use: No    Alcohol/week: 0.0 standard drinks  . Drug use: No  . Sexual activity: Yes    Partners: Male    Birth control/protection: Surgical  Other Topics Concern  . Not on file  Social History Narrative  . Not on file   Social Determinants of Health   Financial Resource  Strain:   . Difficulty of Paying Living Expenses: Not on file  Food Insecurity:   . Worried About Charity fundraiser in the Last Year: Not on file  . Ran Out of Food in the Last Year: Not on file  Transportation Needs:   . Lack of Transportation (Medical): Not on file  . Lack of Transportation (Non-Medical): Not on file  Physical Activity:   . Days of Exercise per Week: Not on file  . Minutes of Exercise per Session: Not on file  Stress:   . Feeling of Stress : Not on file  Social Connections:   . Frequency of Communication with Friends and Family: Not on file  . Frequency of Social Gatherings with Friends and Family: Not on file  . Attends Religious Services: Not  on file  . Active Member of Clubs or Organizations: Not on file  . Attends Archivist Meetings: Not on file  . Marital Status: Not on file    Allergies:  Allergies  Allergen Reactions  . Amoxicillin Anaphylaxis, Hives, Shortness Of Breath, Swelling and Rash    Has patient had a PCN reaction causing immediate rash, facial/tongue/throat swelling, SOB or lightheadedness with hypotension: Yes Has patient had a PCN reaction causing severe rash involving mucus membranes or skin necrosis: Yes Has patient had a PCN reaction that required hospitalization No Has patient had a PCN reaction occurring within the last 10 years: No If all of the above answers are "NO", then may proceed with Cephalosporin use. Other reaction(s): ANAPHYLAXIS Other reaction(s): ANAPHYLAX  . Penicillins Anaphylaxis, Swelling, Rash, Shortness Of Breath and Hives    Has patient had a PCN reaction causing immediate rash, facial/tongue/throat swelling, SOB or lightheadedness with hypotension: Yes Has patient had a PCN reaction causing severe rash involving mucus membranes or skin necrosis: Yes Has patient had a PCN reaction that required hospitalization No Has patient had a PCN reaction occurring within the last 10 years: No If all of the above  answers are "NO", then may proceed with Cephalosporin use.  Other reaction(s): ANAPHYLAXIS Other reaction(s): ANAPHYLAXIS Has patient had a PCN reaction causing immediate rash, facial/tongue/throat swelling, SOB or lightheadedness with hypotension: Yes Has patient had a PCN reaction causing severe rash involving mucus membranes or skin necrosis: Yes Has patient had a PCN reaction that required hospitalization No Has patient had a PCN reaction occurring within the last 10 years: No If all of the above answers are "NO", then may proceed with Cephalosporin use. Other reaction(s): ANAPHYLAXIS Other reaction(s): ANAPHYLAX Has patient had a PCN reaction causing immediate rash, facial/tongue/throat swelling, SOB or lightheadedness with hypotension: Yes Has patient had a PCN reaction causing severe rash involving mucus membranes or skin necrosis: Yes Has patient had a PCN reaction that required hospitalization No Has patient had a PCN reaction occurring within the last 10 years: No If all of the above answers are "NO", then may proceed with Cephalosporin use. Other reaction(s): ANAPHYLAXIS Other reaction(s): ANAPHYLAXIS   . Morphine Other (See Comments)    Muscle spasms  . Sulfa Antibiotics Nausea Only    Other reaction(s): VOMITING Other reaction(s): VOMITING Other reaction(s): VOMITING    Metabolic Disorder Labs: Lab Results  Component Value Date   HGBA1C 4.8 03/23/2014   No results found for: PROLACTIN No results found for: CHOL, TRIG, HDL, CHOLHDL, VLDL, LDLCALC Lab Results  Component Value Date   TSH  05/13/2009    3.163 (NOTE) Please note change in reference ranges for ages 99W to 67Y. Test methodology is 3rd generation TSH   TSH 1.503 Test methodology is 3rd generation TSH 09/05/2008    Therapeutic Level Labs: No results found for: LITHIUM No results found for: VALPROATE No components found for:  CBMZ  Current Medications: Current Outpatient Medications  Medication Sig  Dispense Refill  . acetaminophen (TYLENOL) 325 MG tablet Take 650 mg by mouth every 4 (four) hours as needed for moderate pain.     [name] buPROPion (WELLBUTRIN XL) 150 MG 24 hr tablet TAKE 2 TABLETS(300 MG) BY MOUTH DAILY 60 tablet 3  . cyanocobalamin (,VITAMIN B-12,) 1000 MCG/ML injection Inject 1,000 mcg into the muscle every 30 (thirty) days.     . diphenoxylate-atropine (LOMOTIL) 2.5-0.025 MG tablet     . hydrOXYzine (ATARAX/VISTARIL) 25 MG tablet Take 0.5-1 tablets (  12.5-25 mg total) by mouth 2 (two) times daily as needed. For anxiety 60 tablet 1  . ibuprofen (ADVIL,MOTRIN) 600 MG tablet Take 1 tablet (600 mg total) by mouth every 6 (six) hours as needed. 20 tablet 0  . lamoTRIgine (LAMICTAL) 25 MG tablet Take 3 tablets (75 mg total) by mouth as directed. Start 1 tablet AM and 2 tablets PM 90 tablet 1  . Multiple Vitamins-Minerals (HM MULTIVITAMIN ADULT GUMMY) CHEW Chew 2 tablets by mouth daily.    . ondansetron (ZOFRAN ODT) 4 MG disintegrating tablet Take 1 tablet (4 mg total) by mouth every 8 (eight) hours as needed. 20 tablet 0  . SUMAtriptan (IMITREX) 100 MG tablet Take 100 mg by mouth every 2 (two) hours as needed for migraine or headache.     . topiramate (TOPAMAX) 50 MG tablet TAKE 1 TABLET BY MOUTH IN THE MORNING, AND 2 TABLETS EVERY NIGHT AT BEDTIME    . VIIBRYD 40 MG TABS TAKE 1 TABLET(40 MG) BY MOUTH DAILY 30 tablet 1  . zolpidem (AMBIEN CR) 12.5 MG CR tablet TAKE 1 TABLET(12.5 MG) BY MOUTH AT BEDTIME AS NEEDED FOR SLEEP 30 tablet 3  . metoprolol succinate (TOPROL-XL) 25 MG 24 hr tablet Take by mouth.    . thiamine (VITAMIN B-1) 100 MG tablet Take 100 mg by mouth.     No current facility-administered medications for this visit.     Musculoskeletal: Strength & Muscle Tone: UTA Gait & Station: Observed as seated Patient leans: N/A  Psychiatric Specialty Exam: Review of Systems  Constitutional: Positive for appetite change, fatigue and fever.  HENT: Positive for congestion.    Musculoskeletal: Positive for myalgias.  Psychiatric/Behavioral: Positive for sleep disturbance.    There were no vitals taken for this visit.There is no height or weight on file to calculate BMI.  General Appearance: Casual  Eye Contact:  Fair  Speech:  Normal Rate  Volume:  Normal  Mood:  Anxious  Affect:  Congruent  Thought Process:  Goal Directed and Descriptions of Associations: Intact  Orientation:  Full (Time, Place, and Person)  Thought Content: Logical   Suicidal Thoughts:  No  Homicidal Thoughts:  No  Memory:  Immediate;   Fair Recent;   Fair Remote;   Fair  Judgement:  Fair  Insight:  Fair  Psychomotor Activity:  Normal  Concentration:  Concentration: Fair and Attention Span: Fair  Recall:  [employer] of Knowledge: Fair  Language: Fair  Akathisia:  No  Handed:  Right  AIMS (if indicated): denies tremors, rigidity  Assets:  Communication Skills Desire for Improvement Housing Social Support Transportation Vocational/Educational  ADL's:  Intact  Cognition: WNL  Sleep:  restless   Screenings:   Assessment and Plan: [name] is a 43 year old Caucasian female who has a history of depression, anxiety, insomnia was evaluated by telemedicine today.  Patient is currently infected with COVID-19 and is in quarantine.  She struggles with symptoms of the infection as documented above.  Patient  is anxious and does have sleep problems.  Discussed plan as noted below.  Plan MDD-unstable Wellbutrin XL 300 mg p.o. daily-reduce dosage Viibryd 40 mg p.o. daily Lamictal 75 mg p.o. daily divided dosage   Anxiety-unstable Hydroxyzine 12.5 to 25 mg p.o. twice daily as needed for severe anxiety. Viibryd 40 mg p.o. daily Patient to continue psychotherapy sessions with Ms. Miguel Dibble.  Insomnia-unstable-likely due to her COVID-19 infection. Ambien CR 12.5 mg p.o. nightly. Discussed with patient to take hydroxyzine 25 mg if  she is waking up in the middle of the  night.  Patient advised to reach out to her therapist .  Crisis plan discussed with patient.  Follow-up in clinic in 3 to 4 weeks or sooner if needed.  January 4 at 8:30 AM  I have spent atleast 15 minutes non face to face with patient today. More than 50 % of the time was spent for psychoeducation and supportive psychotherapy and care coordination. This note was generated in part or whole with voice recognition software. Voice recognition is usually quite accurate but there are transcription errors that can and very often do occur. I apologize for any typographical errors that were not detected and corrected.      Ursula Alert, MD 01/26/2019, 12:28 PM

## 2019-02-19 ENCOUNTER — Encounter: Payer: Self-pay | Admitting: Psychiatry

## 2019-02-19 ENCOUNTER — Ambulatory Visit (INDEPENDENT_AMBULATORY_CARE_PROVIDER_SITE_OTHER): Payer: Medicare Other | Admitting: Psychiatry

## 2019-02-19 ENCOUNTER — Telehealth: Payer: Self-pay

## 2019-02-19 ENCOUNTER — Other Ambulatory Visit: Payer: Self-pay

## 2019-02-19 DIAGNOSIS — F410 Panic disorder [episodic paroxysmal anxiety] without agoraphobia: Secondary | ICD-10-CM

## 2019-02-19 DIAGNOSIS — F5105 Insomnia due to other mental disorder: Secondary | ICD-10-CM

## 2019-02-19 DIAGNOSIS — F4310 Post-traumatic stress disorder, unspecified: Secondary | ICD-10-CM | POA: Diagnosis not present

## 2019-02-19 DIAGNOSIS — F333 Major depressive disorder, recurrent, severe with psychotic symptoms: Secondary | ICD-10-CM

## 2019-02-19 MED ORDER — ZOLPIDEM TARTRATE ER 12.5 MG PO TBCR: EXTENDED_RELEASE_TABLET | ORAL | 0 refills | Status: DC

## 2019-02-19 MED ORDER — ZOLPIDEM TARTRATE ER 12.5 MG PO TBCR: EXTENDED_RELEASE_TABLET | ORAL | 3 refills | Status: DC

## 2019-02-19 MED ORDER — ARIPIPRAZOLE 2 MG PO TABS: 2.0000 mg | ORAL_TABLET | Freq: Every day | ORAL | 1 refills | Status: DC

## 2019-02-19 MED ORDER — LAMOTRIGINE 25 MG PO TABS: 75.0000 mg | ORAL_TABLET | ORAL | 1 refills | Status: DC

## 2019-02-19 NOTE — Progress Notes (Addendum)
Virtual Visit via Video Note  I connected with [name] on 02/19/19 at  8:30 AM EST by a video enabled telemedicine application and verified that I am speaking with the correct person using two identifiers.   I discussed the limitations of evaluation and management by telemedicine and the availability of in person appointments. The patient expressed understanding and agreed to proceed.    I discussed the assessment and treatment plan with the patient. The patient was provided an opportunity to ask questions and all were answered. The patient agreed with the plan and demonstrated an understanding of the instructions.   The patient was advised to call back or seek an in-person evaluation if the symptoms worsen or if the condition fails to improve as anticipated.  [place] MD OP Progress Note  02/19/2019 4:18 PM [name]  MRN:  [id number]  Chief Complaint:  Chief Complaint    Follow-up     HPI: [name] is a 44 year old Caucasian female, divorced, on SSD, lives in [place], has a history of MDD, PTSD, panic attacks, insomnia was evaluated by telemedicine today.  Patient today reports she is currently recovering from the COVID-19 infection.  She reports initially she had a lot of fatigue however she is better now.  She also struggled with a lot of sleep problems.  She reports she has took a dosage of Ambien during the day 2-3 times while she was struggling with COVID-19.  She hence may run out of her medication early this time.  She reports she understands it was wrong however she did not know what to do.  Patient also reports she has a lot of racing thoughts at night.  She also feels paranoid ever since being infected with COVID-19.  She reports she feels she is in the spotlight.  This is making her more and more withdrawn.  Patient reports she is depressed as well as anxious.  Patient denies any suicidality, homicidality.  She denies any other perceptual disturbances other  than paranoia.  Patient reports she has been following up with her therapist and has upcoming appointment scheduled.  She is compliant on medications.  Discussed adding Abilify which she has never tried before.  Patient agrees with plan.  Provided medication education.   Visit Diagnosis:    ICD-10-CM   1. MDD (major depressive disorder), recurrent, severe, with psychosis (HCC)  F33.3 ARIPiprazole (ABILIFY) 2 MG tablet    lamoTRIgine (LAMICTAL) 25 MG tablet    DISCONTINUED: zolpidem (AMBIEN CR) 12.5 MG CR tablet  2. Panic disorder  F41.0 DISCONTINUED: zolpidem (AMBIEN CR) 12.5 MG CR tablet  3. PTSD (post-traumatic stress disorder)  F43.10 DISCONTINUED: zolpidem (AMBIEN CR) 12.5 MG CR tablet  4. Insomnia due to mental condition  F51.05 DISCONTINUED: zolpidem (AMBIEN CR) 12.5 MG CR tablet    Past Psychiatric History: I have reviewed past psychiatric history from my progress note on 05/09/2017.  Past Medical History:  Past Medical History:  Diagnosis Date  . Abnormal ECG   . Anemia   . Anxiety   . Arthritis   . B12 deficiency   . Cardiomyopathy ([place])   . Chronic abdominal pain   . Complication of anesthesia    difficulty to get sedated during endoscopy  . COVID-19 01/18/2019  . DDD (degenerative disc disease), lumbar   . Depression   . Diabetes mellitus without complication ([place])   . Dysrhythmia   . GERD (gastroesophageal reflux disease)   . Headache   . Iron deficiency   .  Neuropathy   . Obesity   . Ovarian cyst   . Pneumonia    within past five years  . PTSD (post-traumatic stress disorder)   . PTSD (post-traumatic stress disorder)   . Rh negative status during pregnancy   . Seizures ([place])   . Small bowel obstruction [facility])     Past Surgical History:  Procedure Laterality Date  . ABDOMINAL HYSTERECTOMY    . BILATERAL SALPINGECTOMY Bilateral 09/29/2015   Procedure: BILATERAL SALPINGECTOMY;  Surgeon: Benjaman Kindler, MD;  Location: [facility] ORS;  Service: Gynecology;   Laterality: Bilateral;  . bowel obstruction  2011  . CHOLECYSTECTOMY    . COLONOSCOPY WITH PROPOFOL N/A 01/24/2018   Procedure: COLONOSCOPY WITH PROPOFOL;  Surgeon: Toledo, Benay Pike, MD;  Location: [facility] ENDOSCOPY;  Service: Gastroenterology;  Laterality: N/A;  . DILATION AND CURETTAGE OF UTERUS    . ESOPHAGOGASTRODUODENOSCOPY    . ESOPHAGOGASTRODUODENOSCOPY (EGD) WITH PROPOFOL N/A 01/24/2018   Procedure: ESOPHAGOGASTRODUODENOSCOPY (EGD) WITH PROPOFOL;  Surgeon: Toledo, Benay Pike, MD;  Location: [facility] ENDOSCOPY;  Service: Gastroenterology;  Laterality: N/A;  . GASTRIC BYPASS    . gi bleed  2009   Surgery-was in ICU for three weeks  . HERNIA REPAIR    . OVARIAN CYST REMOVAL Left 09/29/2015   Procedure: OVARIAN CYSTECTOMY;  Surgeon: Benjaman Kindler, MD;  Location: [facility] ORS;  Service: Gynecology;  Laterality: Left;  . ROUX-EN-Y PROCEDURE    . VAGINAL HYSTERECTOMY N/A 09/29/2015   Procedure: HYSTERECTOMY VAGINAL;  Surgeon: Benjaman Kindler, MD;  Location: [facility] ORS;  Service: Gynecology;  Laterality: N/A;    Family Psychiatric History: I have reviewed family psychiatric history from my progress note on 05/09/2017.  Family History:  Family History  Problem Relation Age of Onset  . Arthritis/Rheumatoid Mother   . Heart block Mother   . Clotting disorder Mother   . Osteoporosis Mother   . Heart attack Mother   . Anxiety disorder Mother   . Depression Mother   . Heart disease Father   . Heart attack Father   . Depression Sister   . Anxiety disorder Sister     Social History: I have reviewed social history from my progress note on 05/09/2017. Social History   Socioeconomic History  . Marital status: Single    Spouse name: Not on file  . Number of children: Not on file  . Years of education: Not on file  . Highest education level: Not on file  Occupational History  . Not on file  Tobacco Use  . Smoking status: Never Smoker  . Smokeless tobacco: Never Used  Substance and Sexual  Activity  . Alcohol use: No    Alcohol/week: 0.0 standard drinks  . Drug use: No  . Sexual activity: Yes    Partners: Male    Birth control/protection: Surgical  Other Topics Concern  . Not on file  Social History Narrative  . Not on file   Social Determinants of Health   Financial Resource Strain:   . Difficulty of Paying Living Expenses: Not on file  Food Insecurity:   . Worried About Charity fundraiser in the Last Year: Not on file  . Ran Out of Food in the Last Year: Not on file  Transportation Needs:   . Lack of Transportation (Medical): Not on file  . Lack of Transportation (Non-Medical): Not on file  Physical Activity:   . Days of Exercise per Week: Not on file  . Minutes of Exercise per Session: Not on file  Stress:   . Feeling of Stress : Not on file  Social Connections:   . Frequency of Communication with Friends and Family: Not on file  . Frequency of Social Gatherings with Friends and Family: Not on file  . Attends Religious Services: Not on file  . Active Member of Clubs or Organizations: Not on file  . Attends Archivist Meetings: Not on file  . Marital Status: Not on file    Allergies:  Allergies  Allergen Reactions  . Amoxicillin Anaphylaxis, Hives, Shortness Of Breath, Swelling and Rash    Has patient had a PCN reaction causing immediate rash, facial/tongue/throat swelling, SOB or lightheadedness with hypotension: Yes Has patient had a PCN reaction causing severe rash involving mucus membranes or skin necrosis: Yes Has patient had a PCN reaction that required hospitalization No Has patient had a PCN reaction occurring within the last 10 years: No If all of the above answers are "NO", then may proceed with Cephalosporin use. Other reaction(s): ANAPHYLAXIS Other reaction(s): ANAPHYLAX  . Penicillins Anaphylaxis, Swelling, Rash, Shortness Of Breath and Hives    Has patient had a PCN reaction causing immediate rash, facial/tongue/throat  swelling, SOB or lightheadedness with hypotension: Yes Has patient had a PCN reaction causing severe rash involving mucus membranes or skin necrosis: Yes Has patient had a PCN reaction that required hospitalization No Has patient had a PCN reaction occurring within the last 10 years: No If all of the above answers are "NO", then may proceed with Cephalosporin use.  Other reaction(s): ANAPHYLAXIS Other reaction(s): ANAPHYLAXIS Has patient had a PCN reaction causing immediate rash, facial/tongue/throat swelling, SOB or lightheadedness with hypotension: Yes Has patient had a PCN reaction causing severe rash involving mucus membranes or skin necrosis: Yes Has patient had a PCN reaction that required hospitalization No Has patient had a PCN reaction occurring within the last 10 years: No If all of the above answers are "NO", then may proceed with Cephalosporin use. Other reaction(s): ANAPHYLAXIS Other reaction(s): ANAPHYLAX Has patient had a PCN reaction causing immediate rash, facial/tongue/throat swelling, SOB or lightheadedness with hypotension: Yes Has patient had a PCN reaction causing severe rash involving mucus membranes or skin necrosis: Yes Has patient had a PCN reaction that required hospitalization No Has patient had a PCN reaction occurring within the last 10 years: No If all of the above answers are "NO", then may proceed with Cephalosporin use. Other reaction(s): ANAPHYLAXIS Other reaction(s): ANAPHYLAXIS   . Morphine Other (See Comments)    Muscle spasms  . Sulfa Antibiotics Nausea Only    Other reaction(s): VOMITING Other reaction(s): VOMITING Other reaction(s): VOMITING    Metabolic Disorder Labs: Lab Results  Component Value Date   HGBA1C 4.8 03/23/2014   No results found for: PROLACTIN No results found for: CHOL, TRIG, HDL, CHOLHDL, VLDL, LDLCALC Lab Results  Component Value Date   TSH  05/13/2009    3.163 (NOTE)  Please note change in reference ranges for ages  41W to 62Y. Test methodology is 3rd generation TSH   TSH 1.503 Test methodology is 3rd generation TSH 09/05/2008    Therapeutic Level Labs: No results found for: LITHIUM No results found for: VALPROATE No components found for:  CBMZ  Current Medications: Current Outpatient Medications  Medication Sig Dispense Refill  . gabapentin (NEURONTIN) 300 MG capsule Take by mouth.    [name] acetaminophen (TYLENOL) 325 MG tablet Take 650 mg by mouth every 4 (four) hours as needed for moderate pain.     [name]  ARIPiprazole (ABILIFY) 2 MG tablet Take 1 tablet (2 mg total) by mouth daily. For mood and paranoia 30 tablet 1  . buPROPion (WELLBUTRIN XL) 150 MG 24 hr tablet TAKE 2 TABLETS(300 MG) BY MOUTH DAILY 60 tablet 3  . cefdinir (OMNICEF) 300 MG capsule     . cyanocobalamin (,VITAMIN B-12,) 1000 MCG/ML injection Inject 1,000 mcg into the muscle every 30 (thirty) days.     . diphenoxylate-atropine (LOMOTIL) 2.5-0.025 MG tablet     . hydrOXYzine (ATARAX/VISTARIL) 25 MG tablet Take 0.5-1 tablets (12.5-25 mg total) by mouth 2 (two) times daily as needed. For anxiety 60 tablet 1  . ibuprofen (ADVIL,MOTRIN) 600 MG tablet Take 1 tablet (600 mg total) by mouth every 6 (six) hours as needed. 20 tablet 0  . lamoTRIgine (LAMICTAL) 25 MG tablet Take 3 tablets (75 mg total) by mouth as directed. Start 1 tablet AM and 2 tablets PM 90 tablet 1  . metoprolol succinate (TOPROL-XL) 25 MG 24 hr tablet Take by mouth.    . Multiple Vitamins-Minerals (HM MULTIVITAMIN ADULT GUMMY) CHEW Chew 2 tablets by mouth daily.    . ondansetron (ZOFRAN ODT) 4 MG disintegrating tablet Take 1 tablet (4 mg total) by mouth every 8 (eight) hours as needed. 20 tablet 0  . SUMAtriptan (IMITREX) 100 MG tablet Take 100 mg by mouth every 2 (two) hours as needed for migraine or headache.     . thiamine (VITAMIN B-1) 100 MG tablet Take 100 mg by mouth.    . topiramate (TOPAMAX) 50 MG tablet TAKE 1 TABLET BY MOUTH IN THE MORNING, AND 2 TABLETS EVERY NIGHT AT  BEDTIME    . VIIBRYD 40 MG TABS TAKE 1 TABLET(40 MG) BY MOUTH DAILY 30 tablet 1  . zolpidem (AMBIEN CR) 12.5 MG CR tablet TAKE 1 TABLET(12.5 MG) BY MOUTH AT BEDTIME AS NEEDED FOR SLEEP 3 tablet 0   No current facility-administered medications for this visit.     Musculoskeletal: Strength & Muscle Tone: UTA Gait & Station: normal Patient leans: N/A  Psychiatric Specialty Exam: Review of Systems  Psychiatric/Behavioral: The patient is nervous/anxious.   All other systems reviewed and are negative.   There were no vitals taken for this visit.There is no height or weight on file to calculate BMI.  General Appearance: Casual  Eye Contact:  Fair  Speech:  Clear and Coherent  Volume:  Normal  Mood:  Anxious  Affect:  Congruent  Thought Process:  Goal Directed and Descriptions of Associations: Intact  Orientation:  Full (Time, Place, and Person)  Thought Content: Paranoid Ideation and Rumination   Suicidal Thoughts:  No  Homicidal Thoughts:  No  Memory:  Immediate;   Fair Recent;   Fair Remote;   Fair  Judgement:  Fair  Insight:  Fair  Psychomotor Activity:  Normal  Concentration:  Concentration: Fair and Attention Span: Fair  Recall:  [employer] of Knowledge: Fair  Language: Fair  Akathisia:  No  Handed:  Right  AIMS (if indicated): Denies tremors, rigidity  Assets:  Communication Skills Desire for Improvement Housing Social Support  ADL's:  Intact  Cognition: WNL  Sleep:  Fair   Screenings:   Assessment and Plan: [name] is a 44 year old Caucasian female who has a history of depression, anxiety, insomnia was evaluated by telemedicine today.  Patient is currently recovering from COVID-19.  She is currently struggling with paranoia and worsening depression and anxiety symptoms and will benefit from medication readjustment.  Plan MDD-unstable Wellbutrin XL 300  mg p.o. daily-reduced dosage Viibryd 40 mg p.o. daily Lamictal 75 mg p.o. daily divided dosage Add  Abilify 2 mg p.o. daily for paranoia.  Anxiety-unstable Hydroxyzine 12.5 to 25 mg p.o. twice daily as needed for severe anxiety Viibryd 40 mg p.o. daily Patient to continue psychotherapy sessions with Ms. Miguel Dibble  Insomnia-improving Patient reports she took couple more pills of Ambien CR while she was struggling with COVID-19 infection to get some sleep during the day.  Patient is aware about taking more of her medication than what is prescribed.  Provided education. Continue Ambien CR 12.5 mg p.o. nightly Hydroxyzine 25 mg p.o. nightly as needed.  Follow-up in clinic in 3 weeks or sooner if needed.  January 25 at 8:30 AM  I have spent atleast 20 minutes non face to face with patient today. More than 50 % of the time was spent for psychoeducation and supportive psychotherapy and care coordination. This note was generated in part or whole with voice recognition software. Voice recognition is usually quite accurate but there are transcription errors that can and very often do occur. I apologize for any typographical errors that were not detected and corrected.       Ursula Alert, MD 02/19/2019, 4:18 PM

## 2019-02-19 NOTE — Telephone Encounter (Signed)
pt at pharmacy now states that you was suppose to send in just 3 pill only not 30

## 2019-02-19 NOTE — Patient Instructions (Signed)
Aripiprazole tablets What is this medicine? ARIPIPRAZOLE (ay ri PIP ray zole) is an atypical antipsychotic. It is used to treat schizophrenia and bipolar disorder, also known as manic-depression. It is also used to treat Tourette's disorder and some symptoms of autism. This medicine may also be used in combination with antidepressants to treat major depressive disorder. This medicine may be used for other purposes; ask your health care provider or pharmacist if you have questions. COMMON BRAND NAME(S): Abilify What should I tell my health care provider before I take this medicine? They need to know if you have any of these conditions:  dehydration  dementia  diabetes  heart disease  history of stroke  low blood counts, like low white cell, platelet, or red cell counts  Parkinson's disease  seizures  suicidal thoughts, plans, or attempt; a previous suicide attempt by you or a family member  an unusual or allergic reaction to aripiprazole, other medicines, foods, dyes, or preservatives  pregnant or trying to get pregnant  breast-feeding How should I use this medicine? Take this medicine by mouth with a glass of water. Follow the directions on the prescription label. You can take this medicine with or without food. Take your doses at regular intervals. Do not take your medicine more often than directed. Do not stop taking except on the advice of your doctor or health care professional. A special MedGuide will be given to you by the pharmacist with each prescription and refill. Be sure to read this information carefully each time. Talk to your pediatrician regarding the use of this medicine in children. While this drug may be prescribed for children as young as 6 years of age for selected conditions, precautions do apply. Overdosage: If you think you have taken too much of this medicine contact a poison control center or emergency room at once. NOTE: This medicine is only for you. Do  not share this medicine with others. What if I miss a dose? If you miss a dose, take it as soon as you can. If it is almost time for your next dose, take only that dose. Do not take double or extra doses. What may interact with this medicine? Do not take this medicine with any of the following medications:  brexpiprazole  cisapride  dronedarone  metoclopramide  pimozide  thioridazine This medicine may also interact with the following medications:  alcohol  carbamazepine  certain medicines for anxiety or sleep  certain medicines for blood pressure  certain medicines for fungal infections like ketoconazole, fluconazole, posaconazole, and itraconazole  clarithromycin  dofetilide  fluoxetine  other medicines that prolong the QT interval (cause an abnormal heart rhythm)  paroxetine  quinidine  rifampin  ziprasidone This list may not describe all possible interactions. Give your health care provider a list of all the medicines, herbs, non-prescription drugs, or dietary supplements you use. Also tell them if you smoke, drink alcohol, or use illegal drugs. Some items may interact with your medicine. What should I watch for while using this medicine? Visit your health care professional for regular checks on your progress. Tell your health care professional if symptoms do not start to get better or if they get worse. Do not stop taking except on your health care professional's advice. You may develop a severe reaction. Your health care professional will tell you how much medicine to take. Patients and their families should watch out for new or worsening depression or thoughts of suicide. Also watch out for sudden changes in feelings   such as feeling anxious, agitated, panicky, irritable, hostile, aggressive, impulsive, severely restless, overly excited and hyperactive, or not being able to sleep. If this happens, especially at the beginning of antidepressant treatment or after a  change in dose, call your health care professional. You may get dizzy or drowsy. Do not drive, use machinery, or do anything that needs mental alertness until you know how this medicine affects you. Do not stand or sit up quickly, especially if you are an older patient. This reduces the risk of dizzy or fainting spells. Alcohol may interfere with the effect of this medicine. Avoid alcoholic drinks. This drug can cause problems with controlling your body temperature. It can lower the response of your body to cold temperatures. If possible, stay indoors during cold weather. If you must go outdoors, wear warm clothes. It can also lower the response of your body to heat. Do not overheat. Do not over-exercise. Stay out of the sun when possible. If you must be in the sun, wear cool clothing. Drink plenty of water. If you have trouble controlling your body temperature, call your health care provider right away. This medicine may cause dry eyes and blurred vision. If you wear contact lenses, you may feel some discomfort. Lubricating drops may help. See your eye doctor if the problem does not go away or is severe. This medicine may increase blood sugar. Ask your health care provider if changes in diet or medicines are needed if you have diabetes. There are have been reports of increased sexual urges or other strong urges such as gambling while taking this medicine. If you experience any of these while taking this medicine, you should report this to your health care professional as soon as possible. What side effects may I notice from receiving this medicine? Side effects that you should report to your doctor or health care professional as soon as possible:  allergic reactions like skin rash, itching or hives, swelling of the face, lips, or tongue  breathing problems  confusion  fast, irregular heartbeat  fever or chills, sore throat  inability to keep still  males: prolonged or painful erection  new or  increased gambling urges, sexual urges, uncontrolled spending, binge or compulsive eating, or other urges  problems with balance, talking, walking  seizures  signs and symptoms of high blood sugar such as being more thirsty or hungry or having to urinate more than normal. You may also feel very tired or have blurry vision  signs and symptoms of low blood pressure like dizziness; feeling faint or lightheaded, falls; unusually weak or tired  signs and symptoms of neuroleptic malignant syndrome like confusion; fast or irregular heartbeat; high fever; increased sweating; stiff muscles  sudden numbness or weakness of the face, arm, or leg  suicidal thoughts or other mood changes  trouble swallowing  uncontrollable movements of the arms, face, head, mouth, neck, or upper body Side effects that usually do not require medical attention (report to your doctor or health care professional if they continue or are bothersome):  constipation  headache  nausea, vomiting  trouble sleeping  weight gain This list may not describe all possible side effects. Call your doctor for medical advice about side effects. You may report side effects to FDA at 1-800-FDA-1088. Where should I keep my medicine? Keep out of the reach of children. Store at room temperature between 15 and 30 degrees C (59 and 86 degrees F). Throw away any unused medicine after the expiration date. NOTE: This sheet   is a summary. It may not cover all possible information. If you have questions about this medicine, talk to your doctor, pharmacist, or health care provider.  2020 Elsevier/Gold Standard (2018-11-28 16:17:22)  

## 2019-02-19 NOTE — Telephone Encounter (Signed)
Sent Ambien Cr - 3 pills

## 2019-02-21 ENCOUNTER — Other Ambulatory Visit: Payer: Self-pay | Admitting: Psychiatry

## 2019-02-21 DIAGNOSIS — F4310 Post-traumatic stress disorder, unspecified: Secondary | ICD-10-CM

## 2019-03-01 ENCOUNTER — Other Ambulatory Visit: Payer: Self-pay | Admitting: Psychiatry

## 2019-03-01 DIAGNOSIS — F410 Panic disorder [episodic paroxysmal anxiety] without agoraphobia: Secondary | ICD-10-CM

## 2019-03-12 ENCOUNTER — Encounter: Payer: Self-pay | Admitting: Psychiatry

## 2019-03-12 ENCOUNTER — Ambulatory Visit (INDEPENDENT_AMBULATORY_CARE_PROVIDER_SITE_OTHER): Payer: Medicare Other | Admitting: Psychiatry

## 2019-03-12 ENCOUNTER — Ambulatory Visit: Payer: Medicare Other | Admitting: Psychiatry

## 2019-03-12 ENCOUNTER — Other Ambulatory Visit: Payer: Self-pay

## 2019-03-12 DIAGNOSIS — F410 Panic disorder [episodic paroxysmal anxiety] without agoraphobia: Secondary | ICD-10-CM

## 2019-03-12 DIAGNOSIS — F333 Major depressive disorder, recurrent, severe with psychotic symptoms: Secondary | ICD-10-CM

## 2019-03-12 DIAGNOSIS — F4310 Post-traumatic stress disorder, unspecified: Secondary | ICD-10-CM | POA: Diagnosis not present

## 2019-03-12 DIAGNOSIS — F5105 Insomnia due to other mental disorder: Secondary | ICD-10-CM

## 2019-03-12 MED ORDER — ZOLPIDEM TARTRATE ER 12.5 MG PO TBCR: EXTENDED_RELEASE_TABLET | ORAL | 2 refills | Status: DC

## 2019-03-12 MED ORDER — VIIBRYD 40 MG PO TABS: ORAL_TABLET | ORAL | 1 refills | Status: DC

## 2019-03-12 NOTE — Progress Notes (Signed)
Provider Location : [facility] Patient Location : Home   Virtual Visit via Video Note  I connected with [name] on 03/12/19 at  4:40 PM EST by a video enabled telemedicine application and verified that I am speaking with the correct person using two identifiers.   I discussed the limitations of evaluation and management by telemedicine and the availability of in person appointments. The patient expressed understanding and agreed to proceed.    I discussed the assessment and treatment plan with the patient. The patient was provided an opportunity to ask questions and all were answered. The patient agreed with the plan and demonstrated an understanding of the instructions.   The patient was advised to call back or seek an in-person evaluation if the symptoms worsen or if the condition fails to improve as anticipated.   [facility] MD OP Progress Note  03/12/2019 3:34 PM [name]  MRN:  [id number]  Chief Complaint:  Chief Complaint    Follow-up     HPI: Saidah is a 44 year old Caucasian female, divorced, on SSD, lives in [place], has a history of MDD, PTSD, panic attacks, insomnia was evaluated by telemedicine today.  Patient today reports she has recovered well from the COVID-19 infection.  She reports her mood symptoms have improved.  She is not as depressed or anxious as she used to be before.  She denies any racing thoughts.  She reports her paranoia has improved on the Abilify.  She is compliant on medications as prescribed.  Patient denies any suicidality, homicidality or perceptual disturbances.  She continues to work with her therapist Ms. Miguel Dibble and reports therapy sessions as going well.  Patient denies any other concerns today. Visit Diagnosis:    ICD-10-CM   1. MDD (major depressive disorder), recurrent, severe, with psychosis ([place])  F33.3 zolpidem (AMBIEN CR) 12.5 MG CR tablet  2. Panic disorder  F41.0 Vilazodone HCl (VIIBRYD) 40 MG TABS   zolpidem (AMBIEN CR) 12.5 MG CR tablet  3. PTSD (post-traumatic stress disorder)  F43.10 Vilazodone HCl (VIIBRYD) 40 MG TABS    zolpidem (AMBIEN CR) 12.5 MG CR tablet  4. Insomnia due to mental condition  F51.05 zolpidem (AMBIEN CR) 12.5 MG CR tablet    Past Psychiatric History: I have reviewed past psychiatric history from my progress note on 05/09/2017.  Past Medical History:  Past Medical History:  Diagnosis Date  . Abnormal ECG   . Anemia   . Anxiety   . Arthritis   . B12 deficiency   . Cardiomyopathy ([place])   . Chronic abdominal pain   . Complication of anesthesia    difficulty to get sedated during endoscopy  . COVID-19 01/18/2019  . DDD (degenerative disc disease), lumbar   . Depression   . Diabetes mellitus without complication ([place])   . Dysrhythmia   . GERD (gastroesophageal reflux disease)   . Headache   . Iron deficiency   . Neuropathy   . Obesity   . Ovarian cyst   . Pneumonia    within past five years  . PTSD (post-traumatic stress disorder)   . PTSD (post-traumatic stress disorder)   . Rh negative status during pregnancy   . Seizures ([place])   . Small bowel obstruction [facility])     Past Surgical History:  Procedure Laterality Date  . ABDOMINAL HYSTERECTOMY    . BILATERAL SALPINGECTOMY Bilateral 09/29/2015   Procedure: BILATERAL SALPINGECTOMY;  Surgeon: Benjaman Kindler, MD;  Location: [facility] ORS;  Service: Gynecology;  Laterality: Bilateral;  .  bowel obstruction  2011  . CHOLECYSTECTOMY    . COLONOSCOPY WITH PROPOFOL N/A 01/24/2018   Procedure: COLONOSCOPY WITH PROPOFOL;  Surgeon: Toledo, Benay Pike, MD;  Location: [facility] ENDOSCOPY;  Service: Gastroenterology;  Laterality: N/A;  . DILATION AND CURETTAGE OF UTERUS    . ESOPHAGOGASTRODUODENOSCOPY    . ESOPHAGOGASTRODUODENOSCOPY (EGD) WITH PROPOFOL N/A 01/24/2018   Procedure: ESOPHAGOGASTRODUODENOSCOPY (EGD) WITH PROPOFOL;  Surgeon: Toledo, Benay Pike, MD;  Location: [facility] ENDOSCOPY;  Service: Gastroenterology;   Laterality: N/A;  . GASTRIC BYPASS    . gi bleed  2009   Surgery-was in ICU for three weeks  . HERNIA REPAIR    . OVARIAN CYST REMOVAL Left 09/29/2015   Procedure: OVARIAN CYSTECTOMY;  Surgeon: Benjaman Kindler, MD;  Location: [facility] ORS;  Service: Gynecology;  Laterality: Left;  . ROUX-EN-Y PROCEDURE    . VAGINAL HYSTERECTOMY N/A 09/29/2015   Procedure: HYSTERECTOMY VAGINAL;  Surgeon: Benjaman Kindler, MD;  Location: [facility] ORS;  Service: Gynecology;  Laterality: N/A;    Family Psychiatric History: Reviewed family psychiatric history from my progress note on 05/09/2017.  Family History:  Family History  Problem Relation Age of Onset  . Arthritis/Rheumatoid Mother   . Heart block Mother   . Clotting disorder Mother   . Osteoporosis Mother   . Heart attack Mother   . Anxiety disorder Mother   . Depression Mother   . Heart disease Father   . Heart attack Father   . Depression Sister   . Anxiety disorder Sister     Social History: Reviewed social history from my progress note on 05/09/2017. Social History   Socioeconomic History  . Marital status: Single    Spouse name: Not on file  . Number of children: Not on file  . Years of education: Not on file  . Highest education level: Not on file  Occupational History  . Not on file  Tobacco Use  . Smoking status: Never Smoker  . Smokeless tobacco: Never Used  Substance and Sexual Activity  . Alcohol use: No    Alcohol/week: 0.0 standard drinks  . Drug use: No  . Sexual activity: Yes    Partners: Male    Birth control/protection: Surgical  Other Topics Concern  . Not on file  Social History Narrative  . Not on file   Social Determinants of Health   Financial Resource Strain:   . Difficulty of Paying Living Expenses: Not on file  Food Insecurity:   . Worried About Charity fundraiser in the Last Year: Not on file  . Ran Out of Food in the Last Year: Not on file  Transportation Needs:   . Lack of Transportation (Medical):  Not on file  . Lack of Transportation (Non-Medical): Not on file  Physical Activity:   . Days of Exercise per Week: Not on file  . Minutes of Exercise per Session: Not on file  Stress:   . Feeling of Stress : Not on file  Social Connections:   . Frequency of Communication with Friends and Family: Not on file  . Frequency of Social Gatherings with Friends and Family: Not on file  . Attends Religious Services: Not on file  . Active Member of Clubs or Organizations: Not on file  . Attends Archivist Meetings: Not on file  . Marital Status: Not on file    Allergies:  Allergies  Allergen Reactions  . Amoxicillin Anaphylaxis, Hives, Shortness Of Breath, Swelling and Rash    Has patient had a  PCN reaction causing immediate rash, facial/tongue/throat swelling, SOB or lightheadedness with hypotension: Yes Has patient had a PCN reaction causing severe rash involving mucus membranes or skin necrosis: Yes Has patient had a PCN reaction that required hospitalization No Has patient had a PCN reaction occurring within the last 10 years: No If all of the above answers are "NO", then may proceed with Cephalosporin use. Other reaction(s): ANAPHYLAXIS Other reaction(s): ANAPHYLAX  . Penicillins Anaphylaxis, Swelling, Rash, Shortness Of Breath and Hives    Has patient had a PCN reaction causing immediate rash, facial/tongue/throat swelling, SOB or lightheadedness with hypotension: Yes Has patient had a PCN reaction causing severe rash involving mucus membranes or skin necrosis: Yes Has patient had a PCN reaction that required hospitalization No Has patient had a PCN reaction occurring within the last 10 years: No If all of the above answers are "NO", then may proceed with Cephalosporin use.  Other reaction(s): ANAPHYLAXIS Other reaction(s): ANAPHYLAXIS Has patient had a PCN reaction causing immediate rash, facial/tongue/throat swelling, SOB or lightheadedness with hypotension: Yes Has  patient had a PCN reaction causing severe rash involving mucus membranes or skin necrosis: Yes Has patient had a PCN reaction that required hospitalization No Has patient had a PCN reaction occurring within the last 10 years: No If all of the above answers are "NO", then may proceed with Cephalosporin use. Other reaction(s): ANAPHYLAXIS Other reaction(s): ANAPHYLAX Has patient had a PCN reaction causing immediate rash, facial/tongue/throat swelling, SOB or lightheadedness with hypotension: Yes Has patient had a PCN reaction causing severe rash involving mucus membranes or skin necrosis: Yes Has patient had a PCN reaction that required hospitalization No Has patient had a PCN reaction occurring within the last 10 years: No If all of the above answers are "NO", then may proceed with Cephalosporin use. Other reaction(s): ANAPHYLAXIS Other reaction(s): ANAPHYLAXIS   . Morphine Other (See Comments)    Muscle spasms  . Sulfa Antibiotics Nausea Only    Other reaction(s): VOMITING Other reaction(s): VOMITING Other reaction(s): VOMITING    Metabolic Disorder Labs: Lab Results  Component Value Date   HGBA1C 4.8 03/23/2014   No results found for: PROLACTIN No results found for: CHOL, TRIG, HDL, CHOLHDL, VLDL, LDLCALC Lab Results  Component Value Date   TSH  05/13/2009    3.163 (NOTE) Please note change in reference ranges for ages 37W to 97Y. Test methodology is 3rd generation TSH   TSH 1.503 Test methodology is 3rd generation TSH 09/05/2008    Therapeutic Level Labs: No results found for: LITHIUM No results found for: VALPROATE No components found for:  CBMZ  Current Medications: Current Outpatient Medications  Medication Sig Dispense Refill  . acetaminophen (TYLENOL) 325 MG tablet Take 650 mg by mouth every 4 (four) hours as needed for moderate pain.     . ARIPiprazole (ABILIFY) 2 MG tablet Take 1 tablet (2 mg total) by mouth daily. For mood and paranoia 30 tablet 1  . buPROPion  (WELLBUTRIN XL) 150 MG 24 hr tablet TAKE 2 TABLETS(300 MG) BY MOUTH DAILY 60 tablet 3  . cefdinir (OMNICEF) 300 MG capsule     . cyanocobalamin (,VITAMIN B-12,) 1000 MCG/ML injection Inject 1,000 mcg into the muscle every 30 (thirty) days.     . diphenoxylate-atropine (LOMOTIL) 2.5-0.025 MG tablet     . gabapentin (NEURONTIN) 300 MG capsule Take by mouth.    . hydrOXYzine (ATARAX/VISTARIL) 25 MG tablet TAKE 1/2 TO 1 TABLET(12.5 TO 25 MG) BY MOUTH TWICE DAILY AS NEEDED FOR  ANXIETY 60 tablet 1  . ibuprofen (ADVIL,MOTRIN) 600 MG tablet Take 1 tablet (600 mg total) by mouth every 6 (six) hours as needed. 20 tablet 0  . lamoTRIgine (LAMICTAL) 25 MG tablet Take 3 tablets (75 mg total) by mouth as directed. Start 1 tablet AM and 2 tablets PM 90 tablet 1  . metoprolol succinate (TOPROL-XL) 25 MG 24 hr tablet Take by mouth.    . Multiple Vitamins-Minerals (HM MULTIVITAMIN ADULT GUMMY) CHEW Chew 2 tablets by mouth daily.    . ondansetron (ZOFRAN ODT) 4 MG disintegrating tablet Take 1 tablet (4 mg total) by mouth every 8 (eight) hours as needed. 20 tablet 0  . SUMAtriptan (IMITREX) 100 MG tablet Take 100 mg by mouth every 2 (two) hours as needed for migraine or headache.     . thiamine (VITAMIN B-1) 100 MG tablet Take 100 mg by mouth.    . topiramate (TOPAMAX) 50 MG tablet TAKE 1 TABLET BY MOUTH IN THE MORNING, AND 2 TABLETS EVERY NIGHT AT BEDTIME    . Vilazodone HCl (VIIBRYD) 40 MG TABS Take daily AM 30 tablet 1  . [START ON 03/22/2019] zolpidem (AMBIEN CR) 12.5 MG CR tablet TAKE 1 TABLET(12.5 MG) BY MOUTH AT BEDTIME AS NEEDED FOR SLEEP 30 tablet 2   No current facility-administered medications for this visit.     Musculoskeletal: Strength & Muscle Tone: UTA Gait & Station: normal Patient leans: N/A  Psychiatric Specialty Exam: Review of Systems  Psychiatric/Behavioral: Negative for agitation, behavioral problems, confusion, decreased concentration, dysphoric mood, hallucinations, self-injury, sleep  disturbance and suicidal ideas. The patient is not nervous/anxious and is not hyperactive.   All other systems reviewed and are negative.   There were no vitals taken for this visit.There is no height or weight on file to calculate BMI.  General Appearance: Casual  Eye Contact:  Fair  Speech:  Clear and Coherent  Volume:  Normal  Mood:  Euthymic  Affect:  Appropriate  Thought Process:  Goal Directed and Descriptions of Associations: Intact  Orientation:  Full (Time, Place, and Person)  Thought Content: Paranoid Ideationimproving    Suicidal Thoughts:  No  Homicidal Thoughts:  No  Memory:  Immediate;   Fair Recent;   Fair Remote;   Fair  Judgement:  Fair  Insight:  Fair  Psychomotor Activity:  Normal  Concentration:  Concentration: Fair and Attention Span: Fair  Recall:  [employer] of Knowledge: Fair  Language: Fair  Akathisia:  No  Handed:  Right  AIMS (if indicated): Denies tremors, rigidity  Assets:  Communication Skills Desire for Improvement Social Support  ADL's:  Intact  Cognition: WNL  Sleep:  Fair   Screenings:   Assessment and Plan: Genita is a 44 year old Caucasian female who has a history of depression, anxiety, insomnia was evaluated by telemedicine today.  Patient is currently making progress with regards to her mood symptoms on the current medication regimen.  Plan as noted below.  Plan MDD-improving Wellbutrin XL 300 mg p.o. daily-reduced dosage Viibryd 40 mg p.o. daily Lamictal 75 mg p.o. daily divided dosage Abilify 2 mg p.o. daily.  Anxiety-improving Hydroxyzine 12.5 to 25 mg p.o. twice daily as needed Continue CBT with Ms. Miguel Dibble  Insomnia-improving Ambien CR 12.5 mg p.o. nightly Hydroxyzine 25 mg p.o. nightly as needed  Follow-up in clinic in 6 weeks or sooner if needed.  March 5 at 10 AM  I have spent atleast 20 minutes non face to face with patient today. More than  50 % of the time was spent for  ordering medications and test  ,psychoeducation and supportive psychotherapy and care coordination,as well as documenting clinical information in electronic health record. This note was generated in part or whole with voice recognition software. Voice recognition is usually quite accurate but there are transcription errors that can and very often do occur. I apologize for any typographical errors that were not detected and corrected.       Ursula Alert, MD 03/12/2019, 3:34 PM

## 2019-03-20 ENCOUNTER — Other Ambulatory Visit: Payer: Self-pay | Admitting: Psychiatry

## 2019-03-20 DIAGNOSIS — F410 Panic disorder [episodic paroxysmal anxiety] without agoraphobia: Secondary | ICD-10-CM

## 2019-03-20 DIAGNOSIS — F4310 Post-traumatic stress disorder, unspecified: Secondary | ICD-10-CM

## 2019-04-15 ENCOUNTER — Other Ambulatory Visit: Payer: Self-pay | Admitting: Psychiatry

## 2019-04-15 DIAGNOSIS — F333 Major depressive disorder, recurrent, severe with psychotic symptoms: Secondary | ICD-10-CM

## 2019-04-19 ENCOUNTER — Telehealth: Payer: Self-pay | Admitting: Psychiatry

## 2019-04-19 NOTE — Telephone Encounter (Signed)
Received letter that Ambien will not be approved by her health insurance plan-[employer].  Olin Hauser CMA to look into prior authorization.

## 2019-04-20 ENCOUNTER — Telehealth (HOSPITAL_COMMUNITY): Payer: Self-pay

## 2019-04-20 ENCOUNTER — Other Ambulatory Visit: Payer: Self-pay

## 2019-04-20 ENCOUNTER — Ambulatory Visit (INDEPENDENT_AMBULATORY_CARE_PROVIDER_SITE_OTHER): Payer: Medicare Other | Admitting: Psychiatry

## 2019-04-20 ENCOUNTER — Encounter: Payer: Self-pay | Admitting: Psychiatry

## 2019-04-20 DIAGNOSIS — F3341 Major depressive disorder, recurrent, in partial remission: Secondary | ICD-10-CM | POA: Diagnosis not present

## 2019-04-20 DIAGNOSIS — F410 Panic disorder [episodic paroxysmal anxiety] without agoraphobia: Secondary | ICD-10-CM

## 2019-04-20 DIAGNOSIS — F5105 Insomnia due to other mental disorder: Secondary | ICD-10-CM | POA: Diagnosis not present

## 2019-04-20 DIAGNOSIS — F4310 Post-traumatic stress disorder, unspecified: Secondary | ICD-10-CM

## 2019-04-20 MED ORDER — LAMOTRIGINE 25 MG PO TABS: 75.0000 mg | ORAL_TABLET | ORAL | 1 refills | Status: DC

## 2019-04-20 MED ORDER — VIIBRYD 40 MG PO TABS: ORAL_TABLET | ORAL | 1 refills | Status: DC

## 2019-04-20 NOTE — Progress Notes (Signed)
Provider Location : [facility] Patient Location : Home  Virtual Visit via Video Note  I connected with [name] on 04/20/19 at 10:00 AM EST by a video enabled telemedicine application and verified that I am speaking with the correct person using two identifiers.   I discussed the limitations of evaluation and management by telemedicine and the availability of in person appointments. The patient expressed understanding and agreed to proceed.   I discussed the assessment and treatment plan with the patient. The patient was provided an opportunity to ask questions and all were answered. The patient agreed with the plan and demonstrated an understanding of the instructions.   The patient was advised to call back or seek an in-person evaluation if the symptoms worsen or if the condition fails to improve as anticipated.   [facility] MD OP Progress Note  04/20/2019 1:14 PM [name]  MRN:  [id number]  Chief Complaint:  Chief Complaint    Follow-up     HPI: [name] is a 44 year old Caucasian female, divorced, on SSD, lives in [place], has a history of MDD, PTSD, panic attacks, insomnia was evaluated by telemedicine today.  Patient today reports she is currently struggling with back pain.  She has an MRI scan scheduled for today with her provider.  She is also currently following up with [facility] pain management to get her pain under control.  She reports her mood symptoms as improving.  She rates her depression at a 5 the past few days, 10 being the worst.  Patient reports sleep as good.  She is compliant on her medications as prescribed.  Patient denies any suicidality, homicidality or perceptual disturbances.  She reports she had to reschedule her appointment with Ms. Miguel Dibble recently however is motivated to see her soon and continue psychotherapy sessions.  Patient denies any other concerns today. Visit Diagnosis:    ICD-10-CM   1. MDD (major depressive disorder),  recurrent, in partial remission (HCC)  F33.41 lamoTRIgine (LAMICTAL) 25 MG tablet  2. Panic disorder  F41.0 Vilazodone HCl (VIIBRYD) 40 MG TABS  3. PTSD (post-traumatic stress disorder)  F43.10 Vilazodone HCl (VIIBRYD) 40 MG TABS  4. Insomnia due to mental condition  F51.05     Past Psychiatric History: I have reviewed past psychiatric history from my progress note on 05/09/2017  Past Medical History:  Past Medical History:  Diagnosis Date  . Abnormal ECG   . Anemia   . Anxiety   . Arthritis   . B12 deficiency   . Cardiomyopathy ([place])   . Chronic abdominal pain   . Complication of anesthesia    difficulty to get sedated during endoscopy  . COVID-19 01/18/2019  . DDD (degenerative disc disease), lumbar   . Depression   . Diabetes mellitus without complication ([place])   . Dysrhythmia   . GERD (gastroesophageal reflux disease)   . Headache   . Iron deficiency   . Neuropathy   . Obesity   . Ovarian cyst   . Pneumonia    within past five years  . PTSD (post-traumatic stress disorder)   . PTSD (post-traumatic stress disorder)   . Rh negative status during pregnancy   . Seizures ([place])   . Small bowel obstruction [facility])     Past Surgical History:  Procedure Laterality Date  . ABDOMINAL HYSTERECTOMY    . BILATERAL SALPINGECTOMY Bilateral 09/29/2015   Procedure: BILATERAL SALPINGECTOMY;  Surgeon: Benjaman Kindler, MD;  Location: [facility] ORS;  Service: Gynecology;  Laterality: Bilateral;  .  bowel obstruction  2011  . CHOLECYSTECTOMY    . COLONOSCOPY WITH PROPOFOL N/A 01/24/2018   Procedure: COLONOSCOPY WITH PROPOFOL;  Surgeon: Toledo, Benay Pike, MD;  Location: [facility] ENDOSCOPY;  Service: Gastroenterology;  Laterality: N/A;  . DILATION AND CURETTAGE OF UTERUS    . ESOPHAGOGASTRODUODENOSCOPY    . ESOPHAGOGASTRODUODENOSCOPY (EGD) WITH PROPOFOL N/A 01/24/2018   Procedure: ESOPHAGOGASTRODUODENOSCOPY (EGD) WITH PROPOFOL;  Surgeon: Toledo, Benay Pike, MD;  Location: [facility] ENDOSCOPY;  Service:  Gastroenterology;  Laterality: N/A;  . GASTRIC BYPASS    . gi bleed  2009   Surgery-was in ICU for three weeks  . HERNIA REPAIR    . OVARIAN CYST REMOVAL Left 09/29/2015   Procedure: OVARIAN CYSTECTOMY;  Surgeon: Benjaman Kindler, MD;  Location: [facility] ORS;  Service: Gynecology;  Laterality: Left;  . ROUX-EN-Y PROCEDURE    . VAGINAL HYSTERECTOMY N/A 09/29/2015   Procedure: HYSTERECTOMY VAGINAL;  Surgeon: Benjaman Kindler, MD;  Location: [facility] ORS;  Service: Gynecology;  Laterality: N/A;    Family Psychiatric History: I have reviewed family psychiatric history from my progress note on 05/09/2017  Family History:  Family History  Problem Relation Age of Onset  . Arthritis/Rheumatoid Mother   . Heart block Mother   . Clotting disorder Mother   . Osteoporosis Mother   . Heart attack Mother   . Anxiety disorder Mother   . Depression Mother   . Heart disease Father   . Heart attack Father   . Depression Sister   . Anxiety disorder Sister     Social History: I have reviewed social history from my progress note on 05/09/2017 Social History   Socioeconomic History  . Marital status: Single    Spouse name: Not on file  . Number of children: Not on file  . Years of education: Not on file  . Highest education level: Not on file  Occupational History  . Not on file  Tobacco Use  . Smoking status: Never Smoker  . Smokeless tobacco: Never Used  Substance and Sexual Activity  . Alcohol use: No    Alcohol/week: 0.0 standard drinks  . Drug use: No  . Sexual activity: Yes    Partners: Male    Birth control/protection: Surgical  Other Topics Concern  . Not on file  Social History Narrative  . Not on file   Social Determinants of Health   Financial Resource Strain:   . Difficulty of Paying Living Expenses: Not on file  Food Insecurity:   . Worried About Charity fundraiser in the Last Year: Not on file  . Ran Out of Food in the Last Year: Not on file  Transportation Needs:   . Lack  of Transportation (Medical): Not on file  . Lack of Transportation (Non-Medical): Not on file  Physical Activity:   . Days of Exercise per Week: Not on file  . Minutes of Exercise per Session: Not on file  Stress:   . Feeling of Stress : Not on file  Social Connections:   . Frequency of Communication with Friends and Family: Not on file  . Frequency of Social Gatherings with Friends and Family: Not on file  . Attends Religious Services: Not on file  . Active Member of Clubs or Organizations: Not on file  . Attends Archivist Meetings: Not on file  . Marital Status: Not on file    Allergies:  Allergies  Allergen Reactions  . Amoxicillin Anaphylaxis, Hives, Shortness Of Breath, Swelling and Rash  Has patient had a PCN reaction causing immediate rash, facial/tongue/throat swelling, SOB or lightheadedness with hypotension: Yes Has patient had a PCN reaction causing severe rash involving mucus membranes or skin necrosis: Yes Has patient had a PCN reaction that required hospitalization No Has patient had a PCN reaction occurring within the last 10 years: No If all of the above answers are "NO", then may proceed with Cephalosporin use. Other reaction(s): ANAPHYLAXIS Other reaction(s): ANAPHYLAX  . Penicillins Anaphylaxis, Swelling, Rash, Shortness Of Breath and Hives    Has patient had a PCN reaction causing immediate rash, facial/tongue/throat swelling, SOB or lightheadedness with hypotension: Yes Has patient had a PCN reaction causing severe rash involving mucus membranes or skin necrosis: Yes Has patient had a PCN reaction that required hospitalization No Has patient had a PCN reaction occurring within the last 10 years: No If all of the above answers are "NO", then may proceed with Cephalosporin use.  Other reaction(s): ANAPHYLAXIS Other reaction(s): ANAPHYLAXIS Has patient had a PCN reaction causing immediate rash, facial/tongue/throat swelling, SOB or lightheadedness  with hypotension: Yes Has patient had a PCN reaction causing severe rash involving mucus membranes or skin necrosis: Yes Has patient had a PCN reaction that required hospitalization No Has patient had a PCN reaction occurring within the last 10 years: No If all of the above answers are "NO", then may proceed with Cephalosporin use. Other reaction(s): ANAPHYLAXIS Other reaction(s): ANAPHYLAX Has patient had a PCN reaction causing immediate rash, facial/tongue/throat swelling, SOB or lightheadedness with hypotension: Yes Has patient had a PCN reaction causing severe rash involving mucus membranes or skin necrosis: Yes Has patient had a PCN reaction that required hospitalization No Has patient had a PCN reaction occurring within the last 10 years: No If all of the above answers are "NO", then may proceed with Cephalosporin use. Other reaction(s): ANAPHYLAXIS Other reaction(s): ANAPHYLAXIS   . Morphine Other (See Comments)    Muscle spasms  . Sulfa Antibiotics Nausea Only    Other reaction(s): VOMITING Other reaction(s): VOMITING Other reaction(s): VOMITING    Metabolic Disorder Labs: Lab Results  Component Value Date   HGBA1C 4.8 03/23/2014   No results found for: PROLACTIN No results found for: CHOL, TRIG, HDL, CHOLHDL, VLDL, LDLCALC Lab Results  Component Value Date   TSH  05/13/2009    3.163 (NOTE) Please note change in reference ranges for ages 79W to 68Y. Test methodology is 3rd generation TSH   TSH 1.503 Test methodology is 3rd generation TSH 09/05/2008    Therapeutic Level Labs: No results found for: LITHIUM No results found for: VALPROATE No components found for:  CBMZ  Current Medications: Current Outpatient Medications  Medication Sig Dispense Refill  . acetaminophen (TYLENOL) 325 MG tablet Take 650 mg by mouth every 4 (four) hours as needed for moderate pain.     . ARIPiprazole (ABILIFY) 2 MG tablet TAKE 1 TABLET BY MOUTH DAILY FOR MOOD AND PARANOIA 30 tablet 1   . buPROPion (WELLBUTRIN XL) 150 MG 24 hr tablet TAKE 2 TABLETS(300 MG) BY MOUTH DAILY 60 tablet 3  . cefdinir (OMNICEF) 300 MG capsule     . cyanocobalamin (,VITAMIN B-12,) 1000 MCG/ML injection Inject 1,000 mcg into the muscle every 30 (thirty) days.     . diphenoxylate-atropine (LOMOTIL) 2.5-0.025 MG tablet     . gabapentin (NEURONTIN) 300 MG capsule Take by mouth.    . hydrOXYzine (ATARAX/VISTARIL) 25 MG tablet TAKE 1/2 TO 1 TABLET(12.5 TO 25 MG) BY MOUTH TWICE DAILY AS NEEDED  FOR ANXIETY 60 tablet 1  . ibuprofen (ADVIL,MOTRIN) 600 MG tablet Take 1 tablet (600 mg total) by mouth every 6 (six) hours as needed. 20 tablet 0  . lamoTRIgine (LAMICTAL) 25 MG tablet Take 3 tablets (75 mg total) by mouth as directed. Start 1 tablet AM and 2 tablets PM 90 tablet 1  . metoprolol succinate (TOPROL-XL) 25 MG 24 hr tablet Take by mouth.    . Multiple Vitamins-Minerals (HM MULTIVITAMIN ADULT GUMMY) CHEW Chew 2 tablets by mouth daily.    . ondansetron (ZOFRAN ODT) 4 MG disintegrating tablet Take 1 tablet (4 mg total) by mouth every 8 (eight) hours as needed. 20 tablet 0  . predniSONE (STERAPRED UNI-PAK 21 TAB) 10 MG (21) TBPK tablet Take 1 tablet by mouth as directed  [place]    . SUMAtriptan (IMITREX) 100 MG tablet Take 100 mg by mouth every 2 (two) hours as needed for migraine or headache.     . thiamine (VITAMIN B-1) 100 MG tablet Take 100 mg by mouth.    . topiramate (TOPAMAX) 50 MG tablet TAKE 1 TABLET BY MOUTH IN THE MORNING, AND 2 TABLETS EVERY NIGHT AT BEDTIME    . Vilazodone HCl (VIIBRYD) 40 MG TABS TAKE 1 TABLET(40 MG) BY MOUTH DAILY 30 tablet 1  . zolpidem (AMBIEN CR) 12.5 MG CR tablet TAKE 1 TABLET(12.5 MG) BY MOUTH AT BEDTIME AS NEEDED FOR SLEEP 30 tablet 2   No current facility-administered medications for this visit.     Musculoskeletal: Strength & Muscle Tone: UTA Gait & Station: normal Patient leans: N/A  Psychiatric Specialty Exam: Review of Systems  Musculoskeletal:  Positive for back pain.  Psychiatric/Behavioral: Positive for dysphoric mood.  All other systems reviewed and are negative.   There were no vitals taken for this visit.There is no height or weight on file to calculate BMI.  General Appearance: Casual  Eye Contact:  Fair  Speech:  Clear and Coherent  Volume:  Normal  Mood:  Depressed Improving  Affect:  Appropriate  Thought Process:  Goal Directed and Descriptions of Associations: Intact  Orientation:  Full (Time, Place, and Person)  Thought Content: Logical   Suicidal Thoughts:  No  Homicidal Thoughts:  No  Memory:  Immediate;   Fair Recent;   Fair Remote;   Fair  Judgement:  Fair  Insight:  Fair  Psychomotor Activity:  Normal  Concentration:  Concentration: Fair and Attention Span: Fair  Recall:  [employer] of Knowledge: Fair  Language: Fair  Akathisia:  No  Handed:  Right  AIMS (if indicated):UTA  Assets:  Communication Skills Desire for Improvement Housing Social Support Transportation Vocational/Educational  ADL's:  Intact  Cognition: WNL  Sleep:  Fair   Screenings:   Assessment and Plan: [name] is a 44 year old Caucasian female who has a history of depression, anxiety, insomnia was evaluated by telemedicine today.  Patient is currently making progress with regards to her mood symptoms.  She does have psychosocial stressors of pain which does affect her mood.  She however is compliant on medications.  She will continue to benefit from psychotherapy sessions.  Plan as noted below.  Plan MDD-improving Wellbutrin XL 300 mg p.o. daily-reduce dosage Viibryd 40 mg p.o. daily Lamictal 75 mg p.o. daily team divided dosage Abilify 2 mg p.o. daily  Anxiety-stable Hydroxyzine 12.5 to 25 mg p.o. twice daily as needed Encouraged patient to continue psychotherapy sessions with Ms. Miguel Dibble  Insomnia-stable Ambien CR 12.5 mg p.o. nightly Hydroxyzine 25  mg p.o. nightly as needed  Follow-up in clinic in 1 month or  sooner if needed.  I have spent atleast 20 minutes non face to face with patient today. More than 50 % of the time was spent for  ordering medications and test ,psychoeducation and supportive psychotherapy and care coordination,as well as documenting clinical information in electronic health record. This note was generated in part or whole with voice recognition software. Voice recognition is usually quite accurate but there are transcription errors that can and very often do occur. I apologize for any typographical errors that were not detected and corrected.     Ursula Alert, MD 04/20/2019, 1:14 PM

## 2019-04-20 NOTE — Telephone Encounter (Signed)
AUTHORIZATION ON FILE: [employer] PRESCRIPTION COVERAGE APPROVED    ZOLPIDEM 12.5MG  CR TABLET PA# [id number] EFFECTIVE 02/16/2019 TO 02/15/2020

## 2019-04-26 ENCOUNTER — Other Ambulatory Visit: Payer: Self-pay | Admitting: Psychiatry

## 2019-04-26 DIAGNOSIS — F410 Panic disorder [episodic paroxysmal anxiety] without agoraphobia: Secondary | ICD-10-CM

## 2019-05-02 ENCOUNTER — Ambulatory Visit
Admission: EM | Admit: 2019-05-02 | Discharge: 2019-05-02 | Disposition: A | Discharge status: Home / Self Care | Payer: Medicare Other | Attending: Emergency Medicine | Admitting: Emergency Medicine

## 2019-05-02 ENCOUNTER — Other Ambulatory Visit: Payer: Self-pay

## 2019-05-02 DIAGNOSIS — R300 Dysuria: Secondary | ICD-10-CM

## 2019-05-02 DIAGNOSIS — N3090 Cystitis, unspecified without hematuria: Secondary | ICD-10-CM | POA: Diagnosis not present

## 2019-05-02 LAB — POCT URINALYSIS DIP (MANUAL ENTRY)
Bilirubin, UA: NEGATIVE
Blood, UA: NEGATIVE
Glucose, UA: NEGATIVE mg/dL
Ketones, POC UA: NEGATIVE mg/dL
Leukocytes, UA: NEGATIVE
Nitrite, UA: NEGATIVE
Protein Ur, POC: NEGATIVE mg/dL
Spec Grav, UA: 1.01 (ref 1.010–1.025)
Urobilinogen, UA: 0.2 E.U./dL
pH, UA: 6.5 (ref 5.0–8.0)

## 2019-05-02 MED ORDER — NITROFURANTOIN MONOHYD MACRO 100 MG PO CAPS: 100.0000 mg | ORAL_CAPSULE | Freq: Two times a day (BID) | ORAL | 0 refills | Status: DC

## 2019-05-02 NOTE — Discharge Instructions (Addendum)
Take over-the-counter Azo for up to 2 days for relief of discomfort.  If your symptoms are not improving, start the [place].    Follow-up with your primary care provider if your symptoms are not improving.

## 2019-05-02 NOTE — ED Triage Notes (Signed)
Patient complains of urinary urgency, frequency and low back pain x 1 week. States that symptoms worsened over the last day.

## 2019-05-02 NOTE — ED Provider Notes (Signed)
[name]    CSN: [id number] Arrival date & time: 05/02/19  [id number]      History   Chief Complaint Chief Complaint  Patient presents with  . Urinary Urgency    HPI [name] is a 44 y.o. female.   Patient presents with 1 week history of urinary frequency, urgency, dysuria, bilateral lower back pain.  She states her symptoms are worse since yesterday evening.  She denies fever, chills, abdominal pain, vaginal discharge, pelvic pain, or other symptoms.  Treatment attempted at home with increased water intake.    The history is provided by the patient.    Past Medical History:  Diagnosis Date  . Abnormal ECG   . Anemia   . Anxiety   . Arthritis   . B12 deficiency   . Cardiomyopathy ([place])   . Chronic abdominal pain   . Complication of anesthesia    difficulty to get sedated during endoscopy  . COVID-19 01/18/2019  . DDD (degenerative disc disease), lumbar   . Depression   . Diabetes mellitus without complication ([place])   . Dysrhythmia   . GERD (gastroesophageal reflux disease)   . Headache   . Iron deficiency   . Neuropathy   . Obesity   . Ovarian cyst   . Pneumonia    within past five years  . PTSD (post-traumatic stress disorder)   . PTSD (post-traumatic stress disorder)   . Rh negative status during pregnancy   . Seizures ([place])   . Small bowel obstruction [facility])     Patient Active Problem List   Diagnosis Date Noted  . MDD (major depressive disorder), recurrent, severe, with psychosis ([place]) 03/12/2019  . MDD (major depressive disorder), recurrent episode, moderate ([place]) 09/15/2018  . Panic disorder 09/15/2018  . PTSD (post-traumatic stress disorder) 09/15/2018  . Insomnia due to mental condition 09/15/2018  . Panniculitis 08/01/2018  . Neuropathy 07/26/2018  . Iron deficiency anemia 11/12/2015  . Uterine fibroid 09/30/2015  . Abnormal uterine bleeding 09/29/2015  . Abdominal pain, generalized 03/27/2015  . Degeneration of  intervertebral disc of lumbar region 12/18/2014  . Abnormal ECG 10/09/2014  . Achilles tendinitis 10/09/2014  . Anxiety 10/09/2014  . DDD (degenerative disc disease), lumbar 10/09/2014  . Clinical depression 10/09/2014  . Class 1 obesity 10/09/2014  . Family planning 06/06/2014  . Fibroid 06/06/2014  . Bariatric surgery status 06/06/2014  . Avitaminosis D 04/22/2014  . Achilles bursitis 04/12/2014  . DDD (degenerative disc disease), lumbosacral 04/12/2014  . LBP (low back pain) 04/12/2014  . Abdominal pain, right upper quadrant 04/12/2014  . Degeneration of intervertebral disc of lumbosacral region 04/12/2014  . Diabetes mellitus arising in pregnancy 04/01/2014  . Personal history of surgery to heart and great vessels, presenting hazards to health 04/01/2014  . Other specified postprocedural states 04/01/2014  . Episode of syncope 02/04/2014  . Breathlessness on exertion 02/04/2014  . Awareness of heartbeats 12/11/2013  . Cardiomyopathy ([place]) 10/29/2013  . Dizziness 10/29/2013  . Mixed incontinence 08/31/2012  . Adiposity 08/31/2012  . Chronic pain associated with significant psychosocial dysfunction 05/12/2012  . Disorder of sacrum 05/12/2012  . Arthropathy of lumbar facet joint 05/12/2012  . Lumbar and sacral osteoarthritis 05/12/2012  . Arthralgia, sacroiliac 05/12/2012    Past Surgical History:  Procedure Laterality Date  . ABDOMINAL HYSTERECTOMY    . BILATERAL SALPINGECTOMY Bilateral 09/29/2015   Procedure: BILATERAL SALPINGECTOMY;  Surgeon: Benjaman Kindler, MD;  Location: [facility] ORS;  Service: Gynecology;  Laterality: Bilateral;  . bowel  obstruction  2011  . CHOLECYSTECTOMY    . COLONOSCOPY WITH PROPOFOL N/A 01/24/2018   Procedure: COLONOSCOPY WITH PROPOFOL;  Surgeon: Toledo, Benay Pike, MD;  Location: [facility] ENDOSCOPY;  Service: Gastroenterology;  Laterality: N/A;  . DILATION AND CURETTAGE OF UTERUS    . ESOPHAGOGASTRODUODENOSCOPY    . ESOPHAGOGASTRODUODENOSCOPY (EGD)  WITH PROPOFOL N/A 01/24/2018   Procedure: ESOPHAGOGASTRODUODENOSCOPY (EGD) WITH PROPOFOL;  Surgeon: Toledo, Benay Pike, MD;  Location: [facility] ENDOSCOPY;  Service: Gastroenterology;  Laterality: N/A;  . GASTRIC BYPASS    . gi bleed  2009   Surgery-was in ICU for three weeks  . HERNIA REPAIR    . OVARIAN CYST REMOVAL Left 09/29/2015   Procedure: OVARIAN CYSTECTOMY;  Surgeon: Benjaman Kindler, MD;  Location: [facility] ORS;  Service: Gynecology;  Laterality: Left;  . ROUX-EN-Y PROCEDURE    . VAGINAL HYSTERECTOMY N/A 09/29/2015   Procedure: HYSTERECTOMY VAGINAL;  Surgeon: Benjaman Kindler, MD;  Location: [facility] ORS;  Service: Gynecology;  Laterality: N/A;    OB History   No obstetric history on file.      Home Medications    Prior to Admission medications   Medication Sig Start Date End Date Taking? Authorizing Provider  acetaminophen (TYLENOL) 325 MG tablet Take 650 mg by mouth every 4 (four) hours as needed for moderate pain.  04/03/14  Yes [provider]  ARIPiprazole (ABILIFY) 2 MG tablet TAKE 1 TABLET BY MOUTH DAILY FOR MOOD AND PARANOIA 04/16/19  Yes Eappen, Ria Clock, MD  buPROPion (WELLBUTRIN XL) 150 MG 24 hr tablet TAKE 2 TABLETS(300 MG) BY MOUTH DAILY 02/21/19  Yes Eappen, Ria Clock, MD  cyanocobalamin (,VITAMIN B-12,) 1000 MCG/ML injection Inject 1,000 mcg into the muscle every 30 (thirty) days.  01/13/15  Yes [provider]  diphenoxylate-atropine (LOMOTIL) 2.5-0.025 MG tablet  10/04/17  Yes [provider]  gabapentin (NEURONTIN) 300 MG capsule Take by mouth. 02/03/19  Yes [provider]  hydrOXYzine (ATARAX/VISTARIL) 25 MG tablet TAKE 1/2 TO 1 TABLET(12.5 TO 25 MG) BY MOUTH TWICE DAILY AS NEEDED FOR ANXIETY 04/26/19  Yes Eappen, Ria Clock, MD  ibuprofen (ADVIL,MOTRIN) 600 MG tablet Take 1 tablet (600 mg total) by mouth every 6 (six) hours as needed. 04/26/18  Yes Veronese, [place], MD  lamoTRIgine (LAMICTAL) 25 MG tablet Take 3 tablets (75 mg total) by mouth as  directed. Start 1 tablet AM and 2 tablets PM 04/20/19  Yes Eappen, Saramma, MD  metoprolol succinate (TOPROL-XL) 25 MG 24 hr tablet Take by mouth. 12/12/17 05/02/19 Yes [provider]  Multiple Vitamins-Minerals (HM MULTIVITAMIN ADULT GUMMY) CHEW Chew 2 tablets by mouth daily.   Yes [provider]  SUMAtriptan (IMITREX) 100 MG tablet Take 100 mg by mouth every 2 (two) hours as needed for migraine or headache.    Yes [provider]  topiramate (TOPAMAX) 50 MG tablet TAKE 1 TABLET BY MOUTH IN THE MORNING, AND 2 TABLETS EVERY NIGHT AT BEDTIME 11/01/17  Yes [provider]  Vilazodone HCl (VIIBRYD) 40 MG TABS TAKE 1 TABLET(40 MG) BY MOUTH DAILY 04/20/19  Yes Eappen, Saramma, MD  zolpidem (AMBIEN CR) 12.5 MG CR tablet TAKE 1 TABLET(12.5 MG) BY MOUTH AT BEDTIME AS NEEDED FOR SLEEP 03/22/19  Yes Ursula Alert, MD  cefdinir (OMNICEF) 300 MG capsule  01/31/19   [provider]  nitrofurantoin, macrocrystal-monohydrate, (MACROBID) 100 MG capsule Take 1 capsule (100 mg total) by mouth 2 (two) times daily. 05/02/19   Sharion Balloon, NP  ondansetron (ZOFRAN ODT) 4 MG disintegrating tablet Take  1 tablet (4 mg total) by mouth every 8 (eight) hours as needed. 04/26/18   Alfred Levins, [place], MD  predniSONE (STERAPRED UNI-PAK 21 TAB) 10 MG (21) TBPK tablet Take 1 tablet by mouth as directed  6 [place] 04/16/19   [provider]  thiamine (VITAMIN B-1) 100 MG tablet Take 100 mg by mouth. 07/22/08   [provider]    Family History Family History  Problem Relation Age of Onset  . Arthritis/Rheumatoid Mother   . Heart block Mother   . Clotting disorder Mother   . Osteoporosis Mother   . Heart attack Mother   . Anxiety disorder Mother   . Depression Mother   . Heart disease Father   . Heart attack Father   . Depression Sister   . Anxiety disorder Sister     Social History Social History   Tobacco Use  . Smoking status: Never Smoker  . Smokeless  tobacco: Never Used  Substance Use Topics  . Alcohol use: No    Alcohol/week: 0.0 standard drinks  . Drug use: No     Allergies   Amoxicillin, Penicillins, Morphine, and Sulfa antibiotics   Review of Systems Review of Systems  Constitutional: Negative for chills and fever.  HENT: Negative for ear pain and sore throat.   Eyes: Negative for pain and visual disturbance.  Respiratory: Negative for cough and shortness of breath.   Cardiovascular: Negative for chest pain and palpitations.  Gastrointestinal: Negative for abdominal pain, diarrhea, nausea and vomiting.  Genitourinary: Positive for dysuria, frequency and urgency. Negative for hematuria, pelvic pain and vaginal discharge.  Musculoskeletal: Positive for back pain. Negative for arthralgias.  Skin: Negative for color change and rash.  Neurological: Negative for seizures and syncope.  All other systems reviewed and are negative.    Physical Exam Triage Vital Signs ED Triage Vitals  Enc Vitals Group     BP      Pulse      Resp      Temp      Temp src      SpO2      Weight      Height      Head Circumference      Peak Flow      Pain Score      Pain Loc      Pain Edu?      Excl. in [place]?    No data found.  Updated Vital Signs BP 120/80 (BP Location: Left Arm)   Pulse 83   Temp 98.9 F (37.2 C) (Oral)   Resp 18   Ht 5\' 7"  (1.702 m)   Wt 150 lb (68 kg)   LMP  (LMP Unknown)   SpO2 98%   BMI 23.49 kg/m   Visual Acuity Right Eye Distance:   Left Eye Distance:   Bilateral Distance:    Right Eye Near:   Left Eye Near:    Bilateral Near:     Physical Exam Vitals and nursing note reviewed.  Constitutional:      General: She is not in acute distress.    Appearance: She is well-developed. She is not ill-appearing.  HENT:     Head: Normocephalic and atraumatic.     Mouth/Throat:     Mouth: Mucous membranes are moist.     Pharynx: Oropharynx is clear.  Eyes:     Conjunctiva/sclera: Conjunctivae  normal.  Cardiovascular:     Rate and Rhythm: Normal rate and regular rhythm.  Heart sounds: No murmur.  Pulmonary:     Effort: Pulmonary effort is normal. No respiratory distress.     Breath sounds: Normal breath sounds.  Abdominal:     General: Bowel sounds are normal. There is no distension.     Palpations: Abdomen is soft.     Tenderness: There is no abdominal tenderness. There is right CVA tenderness and left CVA tenderness. There is no guarding or rebound.     Comments: Mild bilateral CVAT.  Musculoskeletal:     Cervical back: Neck supple.  Skin:    General: Skin is warm and dry.     Findings: No rash.  Neurological:     General: No focal deficit present.     Mental Status: She is alert and oriented to person, place, and time.  Psychiatric:        Mood and Affect: Mood normal.        Behavior: Behavior normal.      UC Treatments / Results  Labs (all labs ordered are listed, but only abnormal results are displayed) Labs Reviewed  POCT URINALYSIS DIP (MANUAL ENTRY)    EKG   Radiology No results found.  Procedures Procedures (including critical care time)  Medications Ordered in UC Medications - No data to display  Initial Impression / Assessment and Plan / UC Course  I have reviewed the triage vital signs and the nursing notes.  Pertinent labs & imaging results that were available during my care of the patient were reviewed by me and considered in my medical decision making (see chart for details).    Cystitis.  Instructed patient to take Azo as needed for discomfort x2 days.  Instructed her to start Macrobid if her symptoms are not improving or get worse.  Instructed her to follow-up with her PCP if her symptoms are not improving.  Patient agrees to plan of care.     Final Clinical Impressions(s) / UC Diagnoses   Final diagnoses:  Cystitis     Discharge Instructions     Take over-the-counter Azo for up to 2 days for relief of discomfort.  If  your symptoms are not improving, start the [place].    Follow-up with your primary care provider if your symptoms are not improving.        ED Prescriptions    Medication Sig Dispense Auth. Provider   nitrofurantoin, macrocrystal-monohydrate, (MACROBID) 100 MG capsule Take 1 capsule (100 mg total) by mouth 2 (two) times daily. 10 capsule Sharion Balloon, NP     PDMP not reviewed this encounter.   Sharion Balloon, NP 05/02/19 [phone]

## 2019-05-23 ENCOUNTER — Encounter: Payer: Self-pay | Admitting: Psychiatry

## 2019-05-23 ENCOUNTER — Ambulatory Visit (INDEPENDENT_AMBULATORY_CARE_PROVIDER_SITE_OTHER): Payer: Medicare Other | Admitting: Psychiatry

## 2019-05-23 ENCOUNTER — Other Ambulatory Visit: Payer: Self-pay

## 2019-05-23 DIAGNOSIS — F4310 Post-traumatic stress disorder, unspecified: Secondary | ICD-10-CM

## 2019-05-23 DIAGNOSIS — F3342 Major depressive disorder, recurrent, in full remission: Secondary | ICD-10-CM | POA: Diagnosis not present

## 2019-05-23 DIAGNOSIS — G4701 Insomnia due to medical condition: Secondary | ICD-10-CM | POA: Insufficient documentation

## 2019-05-23 DIAGNOSIS — F410 Panic disorder [episodic paroxysmal anxiety] without agoraphobia: Secondary | ICD-10-CM | POA: Diagnosis not present

## 2019-05-23 MED ORDER — ZOLPIDEM TARTRATE ER 12.5 MG PO TBCR: EXTENDED_RELEASE_TABLET | ORAL | 2 refills | Status: DC

## 2019-05-23 MED ORDER — BUPROPION HCL ER (XL) 150 MG PO TB24: ORAL_TABLET | ORAL | 3 refills | Status: DC

## 2019-05-23 MED ORDER — LAMOTRIGINE 25 MG PO TABS: 75.0000 mg | ORAL_TABLET | ORAL | 3 refills | Status: DC

## 2019-05-23 MED ORDER — VIIBRYD 40 MG PO TABS: ORAL_TABLET | ORAL | 3 refills | Status: DC

## 2019-05-23 MED ORDER — ARIPIPRAZOLE 2 MG PO TABS: ORAL_TABLET | ORAL | 3 refills | Status: DC

## 2019-05-23 NOTE — Progress Notes (Signed)
Provider Location : [facility] Patient Location : Home  Virtual Visit via Video Note  I connected with [name] on 05/23/19 at 10:20 AM EDT by a video enabled telemedicine application and verified that I am speaking with the correct person using two identifiers.   I discussed the limitations of evaluation and management by telemedicine and the availability of in person appointments. The patient expressed understanding and agreed to proceed.   I discussed the assessment and treatment plan with the patient. The patient was provided an opportunity to ask questions and all were answered. The patient agreed with the plan and demonstrated an understanding of the instructions.   The patient was advised to call back or seek an in-person evaluation if the symptoms worsen or if the condition fails to improve as anticipated.   [facility]man MD OP Progress Note  05/23/2019 10:55 AM [name]  MRN:  [id number]  Chief Complaint:  Chief Complaint    Follow-up     HPI: Tom is a 44 year old Caucasian female, divorced, on SSD, lives in [place], has a history of MDD, PTSD, panic attacks, insomnia was evaluated by telemedicine today.  Patient today reports she continues to struggle with back pain.  She reports she got epidural injection few months ago and she was doing well until yesterday when she lifted something and currently has excruciating pain.  She reports she is planning to get in touch with her provider again for management of her pain.  She reports her depression and anxiety symptoms are currently stable on the current medication regimen.  She is compliant on medications and denies side effects.  She denies any sleep issues.  She is compliant on Ambien.  She denies any suicidality, homicidality or perceptual disturbances.  She continues to follow-up with her therapist Ms. Miguel Dibble and agrees to make an appointment soon.  She denies any other concerns today. Visit  Diagnosis:    ICD-10-CM   1. MDD (major depressive disorder), recurrent, in full remission ([place])  F33.42 ARIPiprazole (ABILIFY) 2 MG tablet    zolpidem (AMBIEN CR) 12.5 MG CR tablet  2. Panic disorder  F41.0 Vilazodone HCl (VIIBRYD) 40 MG TABS    zolpidem (AMBIEN CR) 12.5 MG CR tablet  3. PTSD (post-traumatic stress disorder)  F43.10 buPROPion (WELLBUTRIN XL) 150 MG 24 hr tablet    Vilazodone HCl (VIIBRYD) 40 MG TABS    zolpidem (AMBIEN CR) 12.5 MG CR tablet  4. Insomnia due to medical condition  G47.01 lamoTRIgine (LAMICTAL) 25 MG tablet    Past Psychiatric History: I have reviewed past psychiatric history from my progress note on 05/09/2017  Past Medical History:  Past Medical History:  Diagnosis Date  . Abnormal ECG   . Anemia   . Anxiety   . Arthritis   . B12 deficiency   . Cardiomyopathy ([place])   . Chronic abdominal pain   . Complication of anesthesia    difficulty to get sedated during endoscopy  . COVID-19 01/18/2019  . DDD (degenerative disc disease), lumbar   . Depression   . Diabetes mellitus without complication ([place])   . Dysrhythmia   . GERD (gastroesophageal reflux disease)   . Headache   . Iron deficiency   . Neuropathy   . Obesity   . Ovarian cyst   . Pneumonia    within past five years  . PTSD (post-traumatic stress disorder)   . PTSD (post-traumatic stress disorder)   . Rh negative status during pregnancy   . Seizures (  [place])   . Small bowel obstruction ([place])     Past Surgical History:  Procedure Laterality Date  . ABDOMINAL HYSTERECTOMY    . BILATERAL SALPINGECTOMY Bilateral 09/29/2015   Procedure: BILATERAL SALPINGECTOMY;  Surgeon: Benjaman Kindler, MD;  Location: [facility] ORS;  Service: Gynecology;  Laterality: Bilateral;  . bowel obstruction  2011  . CHOLECYSTECTOMY    . COLONOSCOPY WITH PROPOFOL N/A 01/24/2018   Procedure: COLONOSCOPY WITH PROPOFOL;  Surgeon: Toledo, Benay Pike, MD;  Location: [facility] ENDOSCOPY;  Service: Gastroenterology;  Laterality:  N/A;  . DILATION AND CURETTAGE OF UTERUS    . ESOPHAGOGASTRODUODENOSCOPY    . ESOPHAGOGASTRODUODENOSCOPY (EGD) WITH PROPOFOL N/A 01/24/2018   Procedure: ESOPHAGOGASTRODUODENOSCOPY (EGD) WITH PROPOFOL;  Surgeon: Toledo, Benay Pike, MD;  Location: [facility] ENDOSCOPY;  Service: Gastroenterology;  Laterality: N/A;  . GASTRIC BYPASS    . gi bleed  2009   Surgery-was in ICU for three weeks  . HERNIA REPAIR    . OVARIAN CYST REMOVAL Left 09/29/2015   Procedure: OVARIAN CYSTECTOMY;  Surgeon: Benjaman Kindler, MD;  Location: [facility] ORS;  Service: Gynecology;  Laterality: Left;  . ROUX-EN-Y PROCEDURE    . VAGINAL HYSTERECTOMY N/A 09/29/2015   Procedure: HYSTERECTOMY VAGINAL;  Surgeon: Benjaman Kindler, MD;  Location: [facility] ORS;  Service: Gynecology;  Laterality: N/A;    Family Psychiatric History: I have reviewed family psychiatric history from my progress note on 05/09/2017  Family History:  Family History  Problem Relation Age of Onset  . Arthritis/Rheumatoid Mother   . Heart block Mother   . Clotting disorder Mother   . Osteoporosis Mother   . Heart attack Mother   . Anxiety disorder Mother   . Depression Mother   . Heart disease Father   . Heart attack Father   . Depression Sister   . Anxiety disorder Sister     Social History: Reviewed social history from my progress note on 05/09/2017 Social History   Socioeconomic History  . Marital status: Single    Spouse name: Not on file  . Number of children: Not on file  . Years of education: Not on file  . Highest education level: Not on file  Occupational History  . Not on file  Tobacco Use  . Smoking status: Never Smoker  . Smokeless tobacco: Never Used  Substance and Sexual Activity  . Alcohol use: No    Alcohol/week: 0.0 standard drinks  . Drug use: No  . Sexual activity: Yes    Partners: Male    Birth control/protection: Surgical  Other Topics Concern  . Not on file  Social History Narrative  . Not on file   Social Determinants  of Health   Financial Resource Strain:   . Difficulty of Paying Living Expenses:   Food Insecurity:   . Worried About Charity fundraiser in the Last Year:   . Arboriculturist in the Last Year:   Transportation Needs:   . Film/video editor (Medical):   [name] Lack of Transportation (Non-Medical):   Physical Activity:   . Days of Exercise per Week:   . Minutes of Exercise per Session:   Stress:   . Feeling of Stress :   Social Connections:   . Frequency of Communication with Friends and Family:   . Frequency of Social Gatherings with Friends and Family:   . Attends Religious Services:   . Active Member of Clubs or Organizations:   . Attends Archivist Meetings:   [name] Marital Status:  Allergies:  Allergies  Allergen Reactions  . Amoxicillin Anaphylaxis, Hives, Shortness Of Breath, Swelling and Rash    Has patient had a PCN reaction causing immediate rash, facial/tongue/throat swelling, SOB or lightheadedness with hypotension: Yes Has patient had a PCN reaction causing severe rash involving mucus membranes or skin necrosis: Yes Has patient had a PCN reaction that required hospitalization No Has patient had a PCN reaction occurring within the last 10 years: No If all of the above answers are "NO", then may proceed with Cephalosporin use. Other reaction(s): ANAPHYLAXIS Other reaction(s): ANAPHYLAX  . Penicillins Anaphylaxis, Swelling, Rash, Shortness Of Breath and Hives    Has patient had a PCN reaction causing immediate rash, facial/tongue/throat swelling, SOB or lightheadedness with hypotension: Yes Has patient had a PCN reaction causing severe rash involving mucus membranes or skin necrosis: Yes Has patient had a PCN reaction that required hospitalization No Has patient had a PCN reaction occurring within the last 10 years: No If all of the above answers are "NO", then may proceed with Cephalosporin use.  Other reaction(s): ANAPHYLAXIS Other reaction(s):  ANAPHYLAXIS Has patient had a PCN reaction causing immediate rash, facial/tongue/throat swelling, SOB or lightheadedness with hypotension: Yes Has patient had a PCN reaction causing severe rash involving mucus membranes or skin necrosis: Yes Has patient had a PCN reaction that required hospitalization No Has patient had a PCN reaction occurring within the last 10 years: No If all of the above answers are "NO", then may proceed with Cephalosporin use. Other reaction(s): ANAPHYLAXIS Other reaction(s): ANAPHYLAX Has patient had a PCN reaction causing immediate rash, facial/tongue/throat swelling, SOB or lightheadedness with hypotension: Yes Has patient had a PCN reaction causing severe rash involving mucus membranes or skin necrosis: Yes Has patient had a PCN reaction that required hospitalization No Has patient had a PCN reaction occurring within the last 10 years: No If all of the above answers are "NO", then may proceed with Cephalosporin use. Other reaction(s): ANAPHYLAXIS Other reaction(s): ANAPHYLAXIS   . Morphine Other (See Comments)    Muscle spasms  . Sulfa Antibiotics Nausea Only    Other reaction(s): VOMITING Other reaction(s): VOMITING Other reaction(s): VOMITING    Metabolic Disorder Labs: Lab Results  Component Value Date   HGBA1C 4.8 03/23/2014   No results found for: PROLACTIN No results found for: CHOL, TRIG, HDL, CHOLHDL, VLDL, LDLCALC Lab Results  Component Value Date   TSH  05/13/2009    3.163 (NOTE)  Please note change in reference ranges for ages 28W to 11Y. Test methodology is 3rd generation TSH   TSH 1.503 Test methodology is 3rd generation TSH 09/05/2008    Therapeutic Level Labs: No results found for: LITHIUM No results found for: VALPROATE No components found for:  CBMZ  Current Medications: Current Outpatient Medications  Medication Sig Dispense Refill  . acetaminophen (TYLENOL) 325 MG tablet Take 650 mg by mouth every 4 (four) hours as needed  for moderate pain.     . ARIPiprazole (ABILIFY) 2 MG tablet TAKE 1 TABLET BY MOUTH DAILY FOR MOOD AND PARANOIA 30 tablet 3  . buPROPion (WELLBUTRIN XL) 150 MG 24 hr tablet Daily AM 60 tablet 3  . cefdinir (OMNICEF) 300 MG capsule     . cyanocobalamin (,VITAMIN B-12,) 1000 MCG/ML injection Inject 1,000 mcg into the muscle every 30 (thirty) days.     . diphenoxylate-atropine (LOMOTIL) 2.5-0.025 MG tablet     . gabapentin (NEURONTIN) 300 MG capsule Take by mouth.    . hydrOXYzine (ATARAX/VISTARIL) 25  MG tablet TAKE 1/2 TO 1 TABLET(12.5 TO 25 MG) BY MOUTH TWICE DAILY AS NEEDED FOR ANXIETY 60 tablet 1  . ibuprofen (ADVIL,MOTRIN) 600 MG tablet Take 1 tablet (600 mg total) by mouth every 6 (six) hours as needed. 20 tablet 0  . lamoTRIgine (LAMICTAL) 25 MG tablet Take 3 tablets (75 mg total) by mouth as directed. Start 1 tablet AM and 2 tablets PM 90 tablet 3  . metoprolol succinate (TOPROL-XL) 25 MG 24 hr tablet Take by mouth.    . Multiple Vitamins-Minerals (HM MULTIVITAMIN ADULT GUMMY) CHEW Chew 2 tablets by mouth daily.    . nitrofurantoin, macrocrystal-monohydrate, (MACROBID) 100 MG capsule Take 1 capsule (100 mg total) by mouth 2 (two) times daily. 10 capsule 0  . ondansetron (ZOFRAN ODT) 4 MG disintegrating tablet Take 1 tablet (4 mg total) by mouth every 8 (eight) hours as needed. 20 tablet 0  . predniSONE (STERAPRED UNI-PAK 21 TAB) 10 MG (21) TBPK tablet Take 1 tablet by mouth as directed  [place]    . SUMAtriptan (IMITREX) 100 MG tablet Take 100 mg by mouth every 2 (two) hours as needed for migraine or headache.     . thiamine (VITAMIN B-1) 100 MG tablet Take 100 mg by mouth.    . topiramate (TOPAMAX) 50 MG tablet TAKE 1 TABLET BY MOUTH IN THE MORNING, AND 2 TABLETS EVERY NIGHT AT BEDTIME    . Vilazodone HCl (VIIBRYD) 40 MG TABS TAKE 1 TABLET(40 MG) BY MOUTH DAILY 30 tablet 3  . [START ON 06/14/2019] zolpidem (AMBIEN CR) 12.5 MG CR tablet TAKE 1 TABLET(12.5 MG) BY MOUTH AT BEDTIME AS  NEEDED FOR SLEEP 30 tablet 2   No current facility-administered medications for this visit.     Musculoskeletal: Strength & Muscle Tone: UTA Gait & Station: normal Patient leans: N/A  Psychiatric Specialty Exam: Review of Systems  Musculoskeletal: Positive for back pain.  Psychiatric/Behavioral: Negative for agitation, behavioral problems, confusion, decreased concentration, dysphoric mood, hallucinations, self-injury, sleep disturbance and suicidal ideas. The patient is not nervous/anxious and is not hyperactive.   All other systems reviewed and are negative.   There were no vitals taken for this visit.There is no height or weight on file to calculate BMI.  General Appearance: Casual  Eye Contact:  Fair  Speech:  Normal Rate  Volume:  Normal  Mood:  Euthymic  Affect:  Congruent  Thought Process:  Goal Directed and Descriptions of Associations: Intact  Orientation:  Full (Time, Place, and Person)  Thought Content: Logical   Suicidal Thoughts:  No  Homicidal Thoughts:  No  Memory:  Immediate;   Fair Recent;   Fair Remote;   Fair  Judgement:  Fair  Insight:  Fair  Psychomotor Activity:  Normal  Concentration:  Concentration: Fair and Attention Span: Fair  Recall:  [employer] of Knowledge: Fair  Language: Fair  Akathisia:  No  Handed:  Right  AIMS (if indicated): UTA  Assets:  Communication Skills Desire for Improvement Social Support  ADL's:  Intact  Cognition: WNL  Sleep:  Fair   Screenings:   Assessment and Plan: Julien is a 44 year old Caucasian female who has a history of depression, anxiety, insomnia was evaluated by telemedicine today.  Patient is currently stable on current medication regimen with regards to her mood.  She however does have psychosocial stressors of her pain and was encouraged to contact her provider for the same.  Plan as noted below.  Plan MDD-stable and in remission  Wellbutrin XL 300 mg p.o. daily-reduced dosage Viibryd 40 mg p.o.  daily Lamictal 75 mg p.o. daily in divided dosage Abilify 2 mg p.o. daily.  If she continues to do well then she can be gradually weaned off of the Abilify.  This was discussed with patient.  Anxiety-stable Hydroxyzine 12.5 to 25 mg p.o. twice daily as needed Encouraged patient to continue psychotherapy sessions with Ms. Miguel Dibble  Insomnia-stable Ambien CR 12.5 mg p.o. nightly Hydroxyzine 25 mg p.o. nightly as needed I have reviewed [place] controlled substance database.  Follow-up in clinic in 6 to 7 weeks or sooner if needed.  I have spent atleast 20 minutes non face to face with patient today. More than 50 % of the time was spent for preparing to see the patient ( e.g., review of test, records ),ordering medications and test ,psychoeducation and supportive psychotherapy and care coordination,as well as documenting clinical information in electronic health record. This note was generated in part or whole with voice recognition software. Voice recognition is usually quite accurate but there are transcription errors that can and very often do occur. I apologize for any typographical errors that were not detected and corrected.       Ursula Alert, MD 05/23/2019, 10:55 AM

## 2019-06-04 NOTE — Telephone Encounter (Signed)
Ok thanks , I spoke to her recently she does not have any concerns at this time about her medications.

## 2019-06-14 ENCOUNTER — Other Ambulatory Visit: Payer: Self-pay | Admitting: Psychiatry

## 2019-06-14 DIAGNOSIS — F3342 Major depressive disorder, recurrent, in full remission: Secondary | ICD-10-CM

## 2019-06-19 ENCOUNTER — Other Ambulatory Visit: Payer: Self-pay | Admitting: Psychiatry

## 2019-06-19 DIAGNOSIS — F4310 Post-traumatic stress disorder, unspecified: Secondary | ICD-10-CM

## 2019-06-28 ENCOUNTER — Other Ambulatory Visit: Payer: Self-pay | Admitting: Psychiatry

## 2019-06-28 DIAGNOSIS — F410 Panic disorder [episodic paroxysmal anxiety] without agoraphobia: Secondary | ICD-10-CM

## 2019-07-11 ENCOUNTER — Other Ambulatory Visit: Payer: Self-pay

## 2019-07-11 ENCOUNTER — Encounter: Payer: Self-pay | Admitting: Psychiatry

## 2019-07-11 ENCOUNTER — Telehealth (INDEPENDENT_AMBULATORY_CARE_PROVIDER_SITE_OTHER): Payer: Medicare Other | Admitting: Psychiatry

## 2019-07-11 DIAGNOSIS — G4701 Insomnia due to medical condition: Secondary | ICD-10-CM

## 2019-07-11 DIAGNOSIS — F4310 Post-traumatic stress disorder, unspecified: Secondary | ICD-10-CM

## 2019-07-11 DIAGNOSIS — F3342 Major depressive disorder, recurrent, in full remission: Secondary | ICD-10-CM

## 2019-07-11 DIAGNOSIS — F410 Panic disorder [episodic paroxysmal anxiety] without agoraphobia: Secondary | ICD-10-CM | POA: Diagnosis not present

## 2019-07-11 MED ORDER — LAMOTRIGINE 100 MG PO TABS: 100.0000 mg | ORAL_TABLET | Freq: Every day | ORAL | 1 refills | Status: DC

## 2019-07-11 NOTE — Progress Notes (Signed)
Provider Location : [facility] Patient Location : Home  Virtual Visit via Video Note  I connected with [name] on 07/11/19 at 10:00 AM EDT by a video enabled telemedicine application and verified that I am speaking with the correct person using two identifiers.   I discussed the limitations of evaluation and management by telemedicine and the availability of in person appointments. The patient expressed understanding and agreed to proceed.     I discussed the assessment and treatment plan with the patient. The patient was provided an opportunity to ask questions and all were answered. The patient agreed with the plan and demonstrated an understanding of the instructions.   The patient was advised to call back or seek an in-person evaluation if the symptoms worsen or if the condition fails to improve as anticipated.   [facility] MD OP Progress Note  07/11/2019 12:28 PM [name]  MRN:  [id number]  Chief Complaint:  Chief Complaint    Follow-up     HPI: [name] is a 44 year old Caucasian female, divorced, on SSD, lives in [place], has a history of MDD, PTSD, panic attacks, insomnia was evaluated by telemedicine today.  Patient today reports she is currently struggling with anxiety symptoms.  She does not know where it is coming from.  She also reports feeling socially withdrawn.  She reports she does not want to be around people a lot and this has been getting worse since the past few weeks.  She however does not know if she is depressed or not.  She does not feel sad.  She is sleeping okay.  She reports appetite is fair.  She reports she is worried about her weight changes.  She reports she may have gained 5 to 8 pounds since the past couple of months.  She reports she does not want to keep gaining weight and wonders what could be contributing to it.  She is aware that she is not very active.  She is planning to make some changes.  She is compliant on the Abilify  and the Lamictal.  She is not aware of any other side effects other than the weight change.  She reports she has been using the hydroxyzine as needed for anxiety and it does not help all the time.  She wonders whether she can increase the dosage.  She has not been compliant with psychotherapy sessions with Ms. Miguel Dibble.  She however reports her daughter is getting out of preschool for the summer and that will give her more time to take care of herself and maybe attend some therapy sessions.  She also reports that her mother's health is better and her mother will be able to watch her daughter while she gets the help.  Patient denies any suicidality, homicidality or perceptual disturbances.  Patient denies any other concerns today.  Visit Diagnosis:    ICD-10-CM   1. MDD (major depressive disorder), recurrent, in full remission ([place])  F33.42 lamoTRIgine (LAMICTAL) 100 MG tablet  2. Panic disorder  F41.0 lamoTRIgine (LAMICTAL) 100 MG tablet  3. PTSD (post-traumatic stress disorder)  F43.10 lamoTRIgine (LAMICTAL) 100 MG tablet  4. Insomnia due to medical condition  G47.01     Past Psychiatric History: I have reviewed past psychiatric history from my progress note on 05/09/2017  Past Medical History:  Past Medical History:  Diagnosis Date  . Abnormal ECG   . Anemia   . Anxiety   . Arthritis   . B12 deficiency   .  Cardiomyopathy ([place])   . Chronic abdominal pain   . Complication of anesthesia    difficulty to get sedated during endoscopy  . COVID-19 01/18/2019  . DDD (degenerative disc disease), lumbar   . Depression   . Diabetes mellitus without complication ([place])   . Dysrhythmia   . GERD (gastroesophageal reflux disease)   . Headache   . Iron deficiency   . Neuropathy   . Obesity   . Ovarian cyst   . Pneumonia    within past five years  . PTSD (post-traumatic stress disorder)   . PTSD (post-traumatic stress disorder)   . Rh negative status during pregnancy   . Seizures  ([place])   . Small bowel obstruction [facility])     Past Surgical History:  Procedure Laterality Date  . ABDOMINAL HYSTERECTOMY    . BILATERAL SALPINGECTOMY Bilateral 09/29/2015   Procedure: BILATERAL SALPINGECTOMY;  Surgeon: Benjaman Kindler, MD;  Location: [facility] ORS;  Service: Gynecology;  Laterality: Bilateral;  . bowel obstruction  2011  . CHOLECYSTECTOMY    . COLONOSCOPY WITH PROPOFOL N/A 01/24/2018   Procedure: COLONOSCOPY WITH PROPOFOL;  Surgeon: Toledo, Benay Pike, MD;  Location: [facility] ENDOSCOPY;  Service: Gastroenterology;  Laterality: N/A;  . DILATION AND CURETTAGE OF UTERUS    . ESOPHAGOGASTRODUODENOSCOPY    . ESOPHAGOGASTRODUODENOSCOPY (EGD) WITH PROPOFOL N/A 01/24/2018   Procedure: ESOPHAGOGASTRODUODENOSCOPY (EGD) WITH PROPOFOL;  Surgeon: Toledo, Benay Pike, MD;  Location: [facility] ENDOSCOPY;  Service: Gastroenterology;  Laterality: N/A;  . GASTRIC BYPASS    . gi bleed  2009   Surgery-was in ICU for three weeks  . HERNIA REPAIR    . OVARIAN CYST REMOVAL Left 09/29/2015   Procedure: OVARIAN CYSTECTOMY;  Surgeon: Benjaman Kindler, MD;  Location: [facility] ORS;  Service: Gynecology;  Laterality: Left;  . ROUX-EN-Y PROCEDURE    . VAGINAL HYSTERECTOMY N/A 09/29/2015   Procedure: HYSTERECTOMY VAGINAL;  Surgeon: Benjaman Kindler, MD;  Location: [facility] ORS;  Service: Gynecology;  Laterality: N/A;    Family Psychiatric History: I have reviewed family psychiatric history from my progress note on 05/09/2017  Family History:  Family History  Problem Relation Age of Onset  . Arthritis/Rheumatoid Mother   . Heart block Mother   . Clotting disorder Mother   . Osteoporosis Mother   . Heart attack Mother   . Anxiety disorder Mother   . Depression Mother   . Heart disease Father   . Heart attack Father   . Depression Sister   . Anxiety disorder Sister     Social History: I have reviewed social history from my progress note on 05/09/2017 Social History   Socioeconomic History  . Marital status: Single     Spouse name: Not on file  . Number of children: Not on file  . Years of education: Not on file  . Highest education level: Not on file  Occupational History  . Not on file  Tobacco Use  . Smoking status: Never Smoker  . Smokeless tobacco: Never Used  Substance and Sexual Activity  . Alcohol use: No    Alcohol/week: 0.0 standard drinks  . Drug use: No  . Sexual activity: Yes    Partners: Male    Birth control/protection: Surgical  Other Topics Concern  . Not on file  Social History Narrative  . Not on file   Social Determinants of Health   Financial Resource Strain:   . Difficulty of Paying Living Expenses:   Food Insecurity:   . Worried About Charity fundraiser  in the Last Year:   . [place] in the Last Year:   Transportation Needs:   . Film/video editor (Medical):   [name] Lack of Transportation (Non-Medical):   Physical Activity:   . Days of Exercise per Week:   . Minutes of Exercise per Session:   Stress:   . Feeling of Stress :   Social Connections:   . Frequency of Communication with Friends and Family:   . Frequency of Social Gatherings with Friends and Family:   . Attends Religious Services:   . Active Member of Clubs or Organizations:   . Attends Archivist Meetings:   [name] Marital Status:     Allergies:  Allergies  Allergen Reactions  . Amoxicillin Anaphylaxis, Hives, Shortness Of Breath, Swelling and Rash    Has patient had a PCN reaction causing immediate rash, facial/tongue/throat swelling, SOB or lightheadedness with hypotension: Yes Has patient had a PCN reaction causing severe rash involving mucus membranes or skin necrosis: Yes Has patient had a PCN reaction that required hospitalization No Has patient had a PCN reaction occurring within the last 10 years: No If all of the above answers are "NO", then may proceed with Cephalosporin use. Other reaction(s): ANAPHYLAXIS Other reaction(s): ANAPHYLAX  . Penicillins Anaphylaxis,  Swelling, Rash, Shortness Of Breath and Hives    Has patient had a PCN reaction causing immediate rash, facial/tongue/throat swelling, SOB or lightheadedness with hypotension: Yes Has patient had a PCN reaction causing severe rash involving mucus membranes or skin necrosis: Yes Has patient had a PCN reaction that required hospitalization No Has patient had a PCN reaction occurring within the last 10 years: No If all of the above answers are "NO", then may proceed with Cephalosporin use.  Other reaction(s): ANAPHYLAXIS Other reaction(s): ANAPHYLAXIS Has patient had a PCN reaction causing immediate rash, facial/tongue/throat swelling, SOB or lightheadedness with hypotension: Yes Has patient had a PCN reaction causing severe rash involving mucus membranes or skin necrosis: Yes Has patient had a PCN reaction that required hospitalization No Has patient had a PCN reaction occurring within the last 10 years: No If all of the above answers are "NO", then may proceed with Cephalosporin use. Other reaction(s): ANAPHYLAXIS Other reaction(s): ANAPHYLAX Has patient had a PCN reaction causing immediate rash, facial/tongue/throat swelling, SOB or lightheadedness with hypotension: Yes Has patient had a PCN reaction causing severe rash involving mucus membranes or skin necrosis: Yes Has patient had a PCN reaction that required hospitalization No Has patient had a PCN reaction occurring within the last 10 years: No If all of the above answers are "NO", then may proceed with Cephalosporin use. Other reaction(s): ANAPHYLAXIS Other reaction(s): ANAPHYLAXIS   . Morphine Other (See Comments)    Muscle spasms  . Sulfa Antibiotics Nausea Only    Other reaction(s): VOMITING Other reaction(s): VOMITING Other reaction(s): VOMITING    Metabolic Disorder Labs: Lab Results  Component Value Date   HGBA1C 4.8 03/23/2014   No results found for: PROLACTIN No results found for: CHOL, TRIG, HDL, CHOLHDL, VLDL,  LDLCALC Lab Results  Component Value Date   TSH  05/13/2009    3.163 (NOTE) Please note change in reference ranges for ages 5W to 76Y. Test methodology is 3rd generation TSH   TSH 1.503 Test methodology is 3rd generation TSH 09/05/2008    Therapeutic Level Labs: No results found for: LITHIUM No results found for: VALPROATE No components found for:  CBMZ  Current Medications: Current Outpatient Medications  Medication Sig  Dispense Refill  . traMADol (ULTRAM) 50 MG tablet Take by mouth.    [name] acetaminophen (TYLENOL) 325 MG tablet Take 650 mg by mouth every 4 (four) hours as needed for moderate pain.     . ARIPiprazole (ABILIFY) 2 MG tablet TAKE 1 TABLET BY MOUTH DAILY FOR MOOD AND PARANOIA 30 tablet 3  . buPROPion (WELLBUTRIN XL) 150 MG 24 hr tablet TAKE 2 TABLETS(300 MG) BY MOUTH DAILY 60 tablet 3  . cefdinir (OMNICEF) 300 MG capsule     . cyanocobalamin (,VITAMIN B-12,) 1000 MCG/ML injection Inject 1,000 mcg into the muscle every 30 (thirty) days.     . diphenoxylate-atropine (LOMOTIL) 2.5-0.025 MG tablet     . gabapentin (NEURONTIN) 300 MG capsule Take by mouth.    . hydrOXYzine (ATARAX/VISTARIL) 25 MG tablet TAKE 1/2 TO 1 TABLET(12.5 TO 25 MG) BY MOUTH TWICE DAILY AS NEEDED FOR ANXIETY 60 tablet 1  . ibuprofen (ADVIL,MOTRIN) 600 MG tablet Take 1 tablet (600 mg total) by mouth every 6 (six) hours as needed. 20 tablet 0  . lamoTRIgine (LAMICTAL) 100 MG tablet Take 1 tablet (100 mg total) by mouth daily. Take 1 tablet for 2 weeks and then increase to 125 mg daily by combining 1 tablet with 25 mg tablet 30 tablet 1  . metoprolol succinate (TOPROL-XL) 25 MG 24 hr tablet Take by mouth.    . Multiple Vitamins-Minerals (HM MULTIVITAMIN ADULT GUMMY) CHEW Chew 2 tablets by mouth daily.    . nitrofurantoin, macrocrystal-monohydrate, (MACROBID) 100 MG capsule Take 1 capsule (100 mg total) by mouth 2 (two) times daily. 10 capsule 0  . ondansetron (ZOFRAN ODT) 4 MG disintegrating tablet Take 1  tablet (4 mg total) by mouth every 8 (eight) hours as needed. 20 tablet 0  . predniSONE (STERAPRED UNI-PAK 21 TAB) 10 MG (21) TBPK tablet Take 1 tablet by mouth as directed  [place]    . SUMAtriptan (IMITREX) 100 MG tablet Take 100 mg by mouth every 2 (two) hours as needed for migraine or headache.     . thiamine (VITAMIN B-1) 100 MG tablet Take 100 mg by mouth.    . topiramate (TOPAMAX) 50 MG tablet TAKE 1 TABLET BY MOUTH IN THE MORNING, AND 2 TABLETS EVERY NIGHT AT BEDTIME    . Vilazodone HCl (VIIBRYD) 40 MG TABS TAKE 1 TABLET(40 MG) BY MOUTH DAILY 30 tablet 3  . zolpidem (AMBIEN CR) 12.5 MG CR tablet TAKE 1 TABLET(12.5 MG) BY MOUTH AT BEDTIME AS NEEDED FOR SLEEP 30 tablet 2   No current facility-administered medications for this visit.     Musculoskeletal: Strength & Muscle Tone: UTA Gait & Station: normal Patient leans: N/A  Psychiatric Specialty Exam: Review of Systems  Psychiatric/Behavioral: The patient is nervous/anxious.   All other systems reviewed and are negative.   There were no vitals taken for this visit.There is no height or weight on file to calculate BMI.  General Appearance: Casual  Eye Contact:  Fair  Speech:  Clear and Coherent  Volume:  Normal  Mood:  Anxious  Affect:  Congruent  Thought Process:  Goal Directed and Descriptions of Associations: Intact  Orientation:  Full (Time, Place, and Person)  Thought Content: Logical   Suicidal Thoughts:  No  Homicidal Thoughts:  No  Memory:  Immediate;   Fair Recent;   Good Remote;   Fair  Judgement:  Fair  Insight:  Fair  Psychomotor Activity:  Normal  Concentration:  Concentration: Fair and Attention Span: Fair  Recall:  Smiley Houseman of Knowledge: Fair  Language: Fair  Akathisia:  No  Handed:  Right  AIMS (if indicated):UTA  Assets:  Communication Skills Desire for Improvement Housing Social Support  ADL's:  Intact  Cognition: WNL  Sleep:  Fair   Screenings:   Assessment and Plan: [name]  Is a 44 year old Caucasian female who has a history of depression, anxiety, insomnia was evaluated by telemedicine today.  Patient is currently struggling with anxiety as well as being socially withdrawn on and off.  She will benefit from medication readjustment and psychotherapy sessions.  She has been noncompliant with therapy sessions.  Patient also with concerns of possible side effects to Abilify-possible weight gain.  Plan as noted below.  Plan MDD-in remission Wellbutrin XL 300 mg p.o. daily-reduce dosage Viibryd 40 mg p.o. daily Lamictal as prescribed Abilify 2 mg p.o. daily.  PTSD/ Panic disorder-unstable Patient will benefit from continued medication readjustment. Increase Lamictal to 100 mg p.o. daily for 2 weeks and then to 125 mg p.o. daily after that. Patient has been noncompliant with CBT, encouraged patient to restart CBT with Ms. Miguel Dibble Hydroxyzine 12.5 to 25 mg p.o. twice daily as needed  Insomnia-stable Ambien CR 12.5 mg p.o. nightly Hydroxyzine 25 mg p.o. nightly as needed I have reviewed [place] controlled substance database  Patient with concerns about side effects to Abilify-she will change her diet, and be active more and will monitor herself.  If she continues to struggle with weight gain and the Abilify needs to be discontinued.  In the meantime will react to start Lamictal.  She will also benefit from CBT as mentioned above.  Follow-up in clinic in 4 weeks or sooner if needed.  I have spent atleast 20 minutes non face to face with patient today. More than 50 % of the time was spent for preparing to see the patient ( e.g., review of test, records ), ordering medications and test ,psychoeducation and supportive psychotherapy and care coordination,as well as documenting clinical information in electronic health record. This note was generated in part or whole with voice recognition software. Voice recognition is usually quite accurate but there are  transcription errors that can and very often do occur. I apologize for any typographical errors that were not detected and corrected.      Ursula Alert, MD 07/11/2019, 12:28 PM

## 2019-07-28 ENCOUNTER — Other Ambulatory Visit: Payer: Self-pay

## 2019-07-28 ENCOUNTER — Encounter: Payer: Self-pay | Admitting: Emergency Medicine

## 2019-07-28 ENCOUNTER — Ambulatory Visit
Admission: EM | Admit: 2019-07-28 | Discharge: 2019-07-28 | Disposition: A | Discharge status: Home / Self Care | Payer: Medicare Other | Attending: Emergency Medicine | Admitting: Emergency Medicine

## 2019-07-28 DIAGNOSIS — J0100 Acute maxillary sinusitis, unspecified: Secondary | ICD-10-CM

## 2019-07-28 DIAGNOSIS — H6981 Other specified disorders of Eustachian tube, right ear: Secondary | ICD-10-CM

## 2019-07-28 MED ORDER — DOXYCYCLINE HYCLATE 100 MG PO CAPS: 100.0000 mg | ORAL_CAPSULE | Freq: Two times a day (BID) | ORAL | 0 refills | Status: DC

## 2019-07-28 MED ORDER — FLUTICASONE PROPIONATE 50 MCG/ACT NA SUSP: 2.0000 | Freq: Every day | NASAL | 0 refills | Status: DC

## 2019-07-28 MED ORDER — CETIRIZINE-PSEUDOEPHEDRINE ER 5-120 MG PO TB12: 1.0000 | ORAL_TABLET | Freq: Every day | ORAL | 0 refills | Status: DC

## 2019-07-28 NOTE — ED Triage Notes (Signed)
Patient c/o sinus congestion and pressure for the past 3 weeks.  Patient denies fevers.  Patient does not want a COVID test today.

## 2019-07-28 NOTE — Discharge Instructions (Addendum)
Use the Flonase on a daily basis for the next month.  If you are not improving follow-up with your primary care physician an ear nose and throat specialist ,or return to our clinic

## 2019-07-28 NOTE — ED Provider Notes (Signed)
[facility]    CSN: [id number] Arrival date & time: 07/28/19  0908      History   Chief Complaint Chief Complaint  Patient presents with  . Sinus Problem    HPI [name] is a 44 y.o. female.   HPI  44 year old female presents with sinus congestion and pressure that she has had for the past 3 weeks.  She feels as if her right ear is stuffy.  She has pain in her lower throat and tenderness also.  She has had tenderness of her sinuses.  She has tried several over-the-counter remedies which have not been beneficial.  She is afebrile and has not been running a fever over this time.  She has numerous comorbidities.  She is allergic to amoxicillin, penicillin morphine and sulfa antibiotics.  She does state that her daughter (71 years old) is currently taking Omnicef for some chest illness.       Past Medical History:  Diagnosis Date  . Abnormal ECG   . Anemia   . Anxiety   . Arthritis   . B12 deficiency   . Cardiomyopathy ([place])   . Chronic abdominal pain   . Complication of anesthesia    difficulty to get sedated during endoscopy  . COVID-19 01/18/2019  . DDD (degenerative disc disease), lumbar   . Depression   . Diabetes mellitus without complication ([place])   . Dysrhythmia   . GERD (gastroesophageal reflux disease)   . Headache   . Iron deficiency   . Neuropathy   . Obesity   . Ovarian cyst   . Pneumonia    within past five years  . PTSD (post-traumatic stress disorder)   . PTSD (post-traumatic stress disorder)   . Rh negative status during pregnancy   . Seizures ([place])   . Small bowel obstruction [facility])     Patient Active Problem List   Diagnosis Date Noted  . Insomnia due to medical condition 05/23/2019  . MDD (major depressive disorder), recurrent, severe, with psychosis ([place]) 03/12/2019  . MDD (major depressive disorder), recurrent episode, moderate ([place]) 09/15/2018  . Panic disorder 09/15/2018  . PTSD (post-traumatic stress disorder)  09/15/2018  . Insomnia due to mental condition 09/15/2018  . Panniculitis 08/01/2018  . Neuropathy 07/26/2018  . Iron deficiency anemia 11/12/2015  . Uterine fibroid 09/30/2015  . Abnormal uterine bleeding 09/29/2015  . Abdominal pain, generalized 03/27/2015  . Degeneration of intervertebral disc of lumbar region 12/18/2014  . Abnormal ECG 10/09/2014  . Achilles tendinitis 10/09/2014  . Anxiety 10/09/2014  . DDD (degenerative disc disease), lumbar 10/09/2014  . Clinical depression 10/09/2014  . Class 1 obesity 10/09/2014  . Family planning 06/06/2014  . Fibroid 06/06/2014  . Bariatric surgery status 06/06/2014  . Avitaminosis D 04/22/2014  . Achilles bursitis 04/12/2014  . DDD (degenerative disc disease), lumbosacral 04/12/2014  . LBP (low back pain) 04/12/2014  . Abdominal pain, right upper quadrant 04/12/2014  . Degeneration of intervertebral disc of lumbosacral region 04/12/2014  . Diabetes mellitus arising in pregnancy 04/01/2014  . Personal history of surgery to heart and great vessels, presenting hazards to health 04/01/2014  . Other specified postprocedural states 04/01/2014  . Episode of syncope 02/04/2014  . Breathlessness on exertion 02/04/2014  . Awareness of heartbeats 12/11/2013  . Cardiomyopathy ([place]) 10/29/2013  . Dizziness 10/29/2013  . Mixed incontinence 08/31/2012  . Adiposity 08/31/2012  . Chronic pain associated with significant psychosocial dysfunction 05/12/2012  . Disorder of sacrum 05/12/2012  . Arthropathy of  lumbar facet joint 05/12/2012  . Lumbar and sacral osteoarthritis 05/12/2012  . Arthralgia, sacroiliac 05/12/2012    Past Surgical History:  Procedure Laterality Date  . ABDOMINAL HYSTERECTOMY    . BILATERAL SALPINGECTOMY Bilateral 09/29/2015   Procedure: BILATERAL SALPINGECTOMY;  Surgeon: Benjaman Kindler, MD;  Location: [facility] ORS;  Service: Gynecology;  Laterality: Bilateral;  . bowel obstruction  2011  . CHOLECYSTECTOMY    . COLONOSCOPY  WITH PROPOFOL N/A 01/24/2018   Procedure: COLONOSCOPY WITH PROPOFOL;  Surgeon: Toledo, Benay Pike, MD;  Location: [facility] ENDOSCOPY;  Service: Gastroenterology;  Laterality: N/A;  . DILATION AND CURETTAGE OF UTERUS    . ESOPHAGOGASTRODUODENOSCOPY    . ESOPHAGOGASTRODUODENOSCOPY (EGD) WITH PROPOFOL N/A 01/24/2018   Procedure: ESOPHAGOGASTRODUODENOSCOPY (EGD) WITH PROPOFOL;  Surgeon: Toledo, Benay Pike, MD;  Location: [facility] ENDOSCOPY;  Service: Gastroenterology;  Laterality: N/A;  . GASTRIC BYPASS    . gi bleed  2009   Surgery-was in ICU for three weeks  . HERNIA REPAIR    . OVARIAN CYST REMOVAL Left 09/29/2015   Procedure: OVARIAN CYSTECTOMY;  Surgeon: Benjaman Kindler, MD;  Location: [facility] ORS;  Service: Gynecology;  Laterality: Left;  . ROUX-EN-Y PROCEDURE    . VAGINAL HYSTERECTOMY N/A 09/29/2015   Procedure: HYSTERECTOMY VAGINAL;  Surgeon: Benjaman Kindler, MD;  Location: [facility] ORS;  Service: Gynecology;  Laterality: N/A;    OB History   No obstetric history on file.      Home Medications    Prior to Admission medications   Medication Sig Start Date End Date Taking? Authorizing Provider  ARIPiprazole (ABILIFY) 2 MG tablet TAKE 1 TABLET BY MOUTH DAILY FOR MOOD AND PARANOIA 06/14/19  Yes Eappen, Ria Clock, MD  buPROPion (WELLBUTRIN XL) 150 MG 24 hr tablet TAKE 2 TABLETS(300 MG) BY MOUTH DAILY 06/19/19  Yes Eappen, Saramma, MD  cyanocobalamin (,VITAMIN B-12,) 1000 MCG/ML injection Inject 1,000 mcg into the muscle every 30 (thirty) days.  01/13/15  Yes [provider]  gabapentin (NEURONTIN) 300 MG capsule Take by mouth. 02/03/19  Yes [provider]  hydrOXYzine (ATARAX/VISTARIL) 25 MG tablet TAKE 1/2 TO 1 TABLET(12.5 TO 25 MG) BY MOUTH TWICE DAILY AS NEEDED FOR ANXIETY 06/29/19  Yes Eappen, Ria Clock, MD  lamoTRIgine (LAMICTAL) 100 MG tablet Take 1 tablet (100 mg total) by mouth daily. Take 1 tablet for 2 weeks and then increase to 125 mg daily by combining 1 tablet with 25 mg tablet  07/11/19  Yes Eappen, Saramma, MD  metoprolol succinate (TOPROL-XL) 25 MG 24 hr tablet Take by mouth. 12/12/17 07/28/19 Yes [provider]  Multiple Vitamins-Minerals (HM MULTIVITAMIN ADULT GUMMY) CHEW Chew 2 tablets by mouth daily.   Yes [provider]  SUMAtriptan (IMITREX) 100 MG tablet Take 100 mg by mouth every 2 (two) hours as needed for migraine or headache.    Yes [provider]  thiamine (VITAMIN B-1) 100 MG tablet Take 100 mg by mouth. 07/22/08  Yes [provider]  topiramate (TOPAMAX) 50 MG tablet TAKE 1 TABLET BY MOUTH IN THE MORNING, AND 2 TABLETS EVERY NIGHT AT BEDTIME 11/01/17  Yes [provider]  Vilazodone HCl (VIIBRYD) 40 MG TABS TAKE 1 TABLET(40 MG) BY MOUTH DAILY 05/23/19  Yes Eappen, Saramma, MD  zolpidem (AMBIEN CR) 12.5 MG CR tablet TAKE 1 TABLET(12.5 MG) BY MOUTH AT BEDTIME AS NEEDED FOR SLEEP 06/14/19  Yes Eappen, Ria Clock, MD  acetaminophen (TYLENOL) 325 MG tablet Take 650 mg by mouth every 4 (four) hours as needed for moderate pain.  04/03/14  [provider]  cetirizine-pseudoephedrine (ZYRTEC-D) 5-120 MG tablet Take 1 tablet by mouth daily. 07/28/19   Lorin Picket, PA-C  diphenoxylate-atropine (LOMOTIL) 2.5-0.025 MG tablet  10/04/17   [provider]  doxycycline (VIBRAMYCIN) 100 MG capsule Take 1 capsule (100 mg total) by mouth 2 (two) times daily. 07/28/19   Lorin Picket, PA-C  fluticasone (FLONASE) 50 MCG/ACT nasal spray Place 2 sprays into both nostrils daily. 07/28/19   Lorin Picket, PA-C  ibuprofen (ADVIL,MOTRIN) 600 MG tablet Take 1 tablet (600 mg total) by mouth every 6 (six) hours as needed. 04/26/18   Rudene Re, MD  ondansetron (ZOFRAN ODT) 4 MG disintegrating tablet Take 1 tablet (4 mg total) by mouth every 8 (eight) hours as needed. 04/26/18   Alfred Levins, [place], MD  predniSONE (STERAPRED UNI-PAK 21 TAB) 10 MG (21) TBPK tablet Take 1 tablet by mouth as directed  6 [place] 04/16/19    [provider]    Family History Family History  Problem Relation Age of Onset  . Arthritis/Rheumatoid Mother   . Heart block Mother   . Clotting disorder Mother   . Osteoporosis Mother   . Heart attack Mother   . Anxiety disorder Mother   . Depression Mother   . Heart disease Father   . Heart attack Father   . Depression Sister   . Anxiety disorder Sister     Social History Social History   Tobacco Use  . Smoking status: Never Smoker  . Smokeless tobacco: Never Used  Vaping Use  . Vaping Use: Never used  Substance Use Topics  . Alcohol use: No    Alcohol/week: 0.0 standard drinks  . Drug use: No     Allergies   Amoxicillin, Penicillins, Morphine, and Sulfa antibiotics   Review of Systems Review of Systems  Constitutional: Positive for activity change. Negative for appetite change, chills, fatigue and fever.  HENT: Positive for congestion, ear pain, rhinorrhea, sinus pressure, sinus pain and sore throat. Negative for ear discharge.   Respiratory: Negative for cough.   All other systems reviewed and are negative.    Physical Exam Triage Vital Signs ED Triage Vitals  Enc Vitals Group     BP 07/28/19 0933 107/70     Pulse Rate 07/28/19 0933 85     Resp 07/28/19 0933 14     Temp 07/28/19 0933 98.4 F (36.9 C)     Temp Source 07/28/19 0933 Oral     SpO2 07/28/19 0933 100 %     Weight 07/28/19 0930 153 lb (69.4 kg)     Height 07/28/19 0930 5\' 7"  (1.702 m)     Head Circumference --      Peak Flow --      Pain Score 07/28/19 0930 6     Pain Loc --      Pain Edu? --      Excl. in [place]? --    No data found.  Updated Vital Signs BP 107/70 (BP Location: Left Arm)   Pulse 85   Temp 98.4 F (36.9 C) (Oral)   Resp 14   Ht 5\' 7"  (1.702 m)   Wt 153 lb (69.4 kg)   LMP  (LMP Unknown)   SpO2 100%   BMI 23.96 kg/m   Visual Acuity Right Eye Distance:   Left Eye Distance:   Bilateral Distance:    Right Eye Near:   Left Eye Near:    Bilateral  Near:     Physical  Exam Vitals and nursing note reviewed.  Constitutional:      General: She is not in acute distress.    Appearance: She is normal weight. She is not ill-appearing or toxic-appearing.  HENT:     Head: Normocephalic and atraumatic.     Right Ear: Ear canal and external ear normal.     Left Ear: Tympanic membrane, ear canal and external ear normal.     Ears:     Comments: Right TM is cloudy with loss of light reflex    Nose: Congestion present. No rhinorrhea.     Comments: She has tenderness to percussion of the frontal sinus and maxillary sinuses more on the maxillary than frontal.  Is also tender along the anterior throat more on the right than the left.    Mouth/Throat:     Mouth: Mucous membranes are moist.     Pharynx: Oropharynx is clear.  Eyes:     Conjunctiva/sclera: Conjunctivae normal.  Cardiovascular:     Rate and Rhythm: Normal rate and regular rhythm.     Heart sounds: Normal heart sounds.  Pulmonary:     Effort: Pulmonary effort is normal.     Breath sounds: Normal breath sounds.  Musculoskeletal:        General: Normal range of motion.     Cervical back: Normal range of motion and neck supple. Tenderness present.  Skin:    General: Skin is warm and dry.  Neurological:     General: No focal deficit present.     Mental Status: She is alert and oriented to person, place, and time.  Psychiatric:        Mood and Affect: Mood normal.        Behavior: Behavior normal.        Thought Content: Thought content normal.        Judgment: Judgment normal.      UC Treatments / Results  Labs (all labs ordered are listed, but only abnormal results are displayed) Labs Reviewed - No data to display  EKG   Radiology No results found.  Procedures Procedures (including critical care time)  Medications Ordered in UC Medications - No data to display  Initial Impression / Assessment and Plan / UC Course  I have reviewed the triage vital signs and the  nursing notes.  Pertinent labs & imaging results that were available during my care of the patient were reviewed by me and considered in my medical decision making (see chart for details).   44 year old female presents with sinus congestion and pressure for the past 3 weeks.  She has had no fevers.  She has tried several over-the-counter medications which have not provided her with any relief.  Her 42-year-old daughter is currently on [place] for a chest illness.  Today she is afebrile pulse of 85 respirations of 14 O2 sats 100% on room air.  Physical exam shows significant tenderness of the maxillary greater than frontal sinuses.  Right ear is cloudy with loss of light reflex possibly from eustachian tube dysfunction due to the sinus congestion.  I will place her on doxycycline since she does have allergies to penicillin.  We will treat for 7 days.  In addition I have given her adjunctive occasions including Flonase nasal spray Zyrtec daily and encouraged her to take Tylenol as necessary to help with the fatigue that she has been feeling.  If she is not improving she should follow-up with her primary care physician, ear nose and throat, or  return to our clinic. Final Clinical Impressions(s) / UC Diagnoses   Final diagnoses:  Acute maxillary sinusitis, recurrence not specified  Eustachian tube dysfunction, right     Discharge Instructions     Use the Flonase on a daily basis for the next month.  If you are not improving follow-up with your primary care physician an ear nose and throat specialist ,or return to our clinic    ED Prescriptions    Medication Sig Dispense Auth. Provider   doxycycline (VIBRAMYCIN) 100 MG capsule Take 1 capsule (100 mg total) by mouth 2 (two) times daily. 14 capsule Crecencio Mc P, PA-C   fluticasone (FLONASE) 50 MCG/ACT nasal spray Place 2 sprays into both nostrils daily. 16 g Crecencio Mc P, PA-C   cetirizine-pseudoephedrine (ZYRTEC-D) 5-120 MG tablet Take 1  tablet by mouth daily. 30 tablet Lorin Picket, PA-C     PDMP not reviewed this encounter.   Lorin Picket, PA-C 07/28/19 1041

## 2019-08-13 ENCOUNTER — Encounter: Payer: Self-pay | Admitting: Psychiatry

## 2019-08-13 ENCOUNTER — Other Ambulatory Visit: Payer: Self-pay

## 2019-08-13 ENCOUNTER — Telehealth (INDEPENDENT_AMBULATORY_CARE_PROVIDER_SITE_OTHER): Payer: Medicare Other | Admitting: Psychiatry

## 2019-08-13 DIAGNOSIS — F410 Panic disorder [episodic paroxysmal anxiety] without agoraphobia: Secondary | ICD-10-CM | POA: Diagnosis not present

## 2019-08-13 DIAGNOSIS — F4310 Post-traumatic stress disorder, unspecified: Secondary | ICD-10-CM | POA: Diagnosis not present

## 2019-08-13 DIAGNOSIS — F3342 Major depressive disorder, recurrent, in full remission: Secondary | ICD-10-CM | POA: Diagnosis not present

## 2019-08-13 DIAGNOSIS — G4701 Insomnia due to medical condition: Secondary | ICD-10-CM

## 2019-08-13 MED ORDER — LAMOTRIGINE 150 MG PO TABS: 150.0000 mg | ORAL_TABLET | Freq: Every day | ORAL | 2 refills | Status: DC

## 2019-08-13 MED ORDER — ZOLPIDEM TARTRATE ER 12.5 MG PO TBCR: EXTENDED_RELEASE_TABLET | ORAL | 2 refills | Status: DC

## 2019-08-13 NOTE — Progress Notes (Signed)
Provider Location : [facility] Patient Location : Home  Virtual Visit via Video Note  I connected with [name] on 08/13/19 at 10:40 AM EDT by a video enabled telemedicine application and verified that I am speaking with the correct person using two identifiers.   I discussed the limitations of evaluation and management by telemedicine and the availability of in person appointments. The patient expressed understanding and agreed to proceed.    I discussed the assessment and treatment plan with the patient. The patient was provided an opportunity to ask questions and all were answered. The patient agreed with the plan and demonstrated an understanding of the instructions.   The patient was advised to call back or seek an in-person evaluation if the symptoms worsen or if the condition fails to improve as anticipated.  [facility] MD OP Progress Note  08/13/2019 10:54 AM [name]  MRN:  [id number]  Chief Complaint:  Chief Complaint    Follow-up     HPI: [name] is a 44 year old Caucasian female, divorced, lives in [place], has a history of MDD, PTSD, panic attacks, insomnia was evaluated by telemedicine today.  Patient today reports she is currently struggling with anxiety symptoms although improving.  She reports she is worried about her daughter's schooling.  She reports her daughter is going to start kindergarten.  She reports she got into a charter school 2 days ago and she is excited about that.  She however continues to worry about the pandemic and how the year is going to be for her daughter.  She reports she is compliant on her medications as prescribed.  She is tolerating the higher dosage of Lamictal.  She reports sleep as good.  She denies any suicidality, homicidality or perceptual disturbances.  She is currently recovering from an ear infection.  Reports she was prescribed doxycycline when she went to the urgent care recently.  Patient with  psychosocial stressors of her mother's health problems.  She reports her mother was recently found to have a mass in her lung and is currently following up with pulmonology.  She is worried about her mother.  Patient reports she was unable to get in touch with Ms. Miguel Dibble however agrees to do so.  She denies any other concerns today.    Visit Diagnosis:    ICD-10-CM   1. MDD (major depressive disorder), recurrent, in full remission ([place])  F33.42 zolpidem (AMBIEN CR) 12.5 MG CR tablet    lamoTRIgine (LAMICTAL) 150 MG tablet  2. Panic disorder  F41.0 zolpidem (AMBIEN CR) 12.5 MG CR tablet    lamoTRIgine (LAMICTAL) 150 MG tablet  3. PTSD (post-traumatic stress disorder)  F43.10 zolpidem (AMBIEN CR) 12.5 MG CR tablet    lamoTRIgine (LAMICTAL) 150 MG tablet  4. Insomnia due to medical condition  G47.01     Past Psychiatric History: I have reviewed past psychiatric history from my progress note on 05/09/2017.  Past Medical History:  Past Medical History:  Diagnosis Date  . Abnormal ECG   . Anemia   . Anxiety   . Arthritis   . B12 deficiency   . Cardiomyopathy ([place])   . Chronic abdominal pain   . Complication of anesthesia    difficulty to get sedated during endoscopy  . COVID-19 01/18/2019  . DDD (degenerative disc disease), lumbar   . Depression   . Diabetes mellitus without complication ([place])   . Dysrhythmia   . GERD (gastroesophageal reflux disease)   . Headache   .  Iron deficiency   . Neuropathy   . Obesity   . Ovarian cyst   . Pneumonia    within past five years  . PTSD (post-traumatic stress disorder)   . PTSD (post-traumatic stress disorder)   . Rh negative status during pregnancy   . Seizures ([place])   . Small bowel obstruction [facility])     Past Surgical History:  Procedure Laterality Date  . ABDOMINAL HYSTERECTOMY    . BILATERAL SALPINGECTOMY Bilateral 09/29/2015   Procedure: BILATERAL SALPINGECTOMY;  Surgeon: Benjaman Kindler, MD;  Location: [facility] ORS;   Service: Gynecology;  Laterality: Bilateral;  . bowel obstruction  2011  . CHOLECYSTECTOMY    . COLONOSCOPY WITH PROPOFOL N/A 01/24/2018   Procedure: COLONOSCOPY WITH PROPOFOL;  Surgeon: Toledo, Benay Pike, MD;  Location: [facility] ENDOSCOPY;  Service: Gastroenterology;  Laterality: N/A;  . DILATION AND CURETTAGE OF UTERUS    . ESOPHAGOGASTRODUODENOSCOPY    . ESOPHAGOGASTRODUODENOSCOPY (EGD) WITH PROPOFOL N/A 01/24/2018   Procedure: ESOPHAGOGASTRODUODENOSCOPY (EGD) WITH PROPOFOL;  Surgeon: Toledo, Benay Pike, MD;  Location: [facility] ENDOSCOPY;  Service: Gastroenterology;  Laterality: N/A;  . GASTRIC BYPASS    . gi bleed  2009   Surgery-was in ICU for three weeks  . HERNIA REPAIR    . OVARIAN CYST REMOVAL Left 09/29/2015   Procedure: OVARIAN CYSTECTOMY;  Surgeon: Benjaman Kindler, MD;  Location: [facility] ORS;  Service: Gynecology;  Laterality: Left;  . ROUX-EN-Y PROCEDURE    . VAGINAL HYSTERECTOMY N/A 09/29/2015   Procedure: HYSTERECTOMY VAGINAL;  Surgeon: Benjaman Kindler, MD;  Location: [facility] ORS;  Service: Gynecology;  Laterality: N/A;    Family Psychiatric History: Reviewed family psychiatric history from my progress note on 05/09/2017.  Family History:  Family History  Problem Relation Age of Onset  . Arthritis/Rheumatoid Mother   . Heart block Mother   . Clotting disorder Mother   . Osteoporosis Mother   . Heart attack Mother   . Anxiety disorder Mother   . Depression Mother   . Heart disease Father   . Heart attack Father   . Depression Sister   . Anxiety disorder Sister     Social History: Reviewed social history from my progress note on 05/09/2017. Social History   Socioeconomic History  . Marital status: Single    Spouse name: Not on file  . Number of children: Not on file  . Years of education: Not on file  . Highest education level: Not on file  Occupational History  . Not on file  Tobacco Use  . Smoking status: Never Smoker  . Smokeless tobacco: Never Used  Vaping Use  .  Vaping Use: Never used  Substance and Sexual Activity  . Alcohol use: No    Alcohol/week: 0.0 standard drinks  . Drug use: No  . Sexual activity: Yes    Partners: Male    Birth control/protection: Surgical  Other Topics Concern  . Not on file  Social History Narrative  . Not on file   Social Determinants of Health   Financial Resource Strain:   . Difficulty of Paying Living Expenses:   Food Insecurity:   . Worried About Charity fundraiser in the Last Year:   . Arboriculturist in the Last Year:   Transportation Needs:   . Film/video editor (Medical):   [name] Lack of Transportation (Non-Medical):   Physical Activity:   . Days of Exercise per Week:   . Minutes of Exercise per Session:   Stress:   .  Feeling of Stress :   Social Connections:   . Frequency of Communication with Friends and Family:   . Frequency of Social Gatherings with Friends and Family:   . Attends Religious Services:   . Active Member of Clubs or Organizations:   . Attends Archivist Meetings:   [name] Marital Status:     Allergies:  Allergies  Allergen Reactions  . Amoxicillin Anaphylaxis, Hives, Shortness Of Breath, Swelling and Rash    Has patient had a PCN reaction causing immediate rash, facial/tongue/throat swelling, SOB or lightheadedness with hypotension: Yes Has patient had a PCN reaction causing severe rash involving mucus membranes or skin necrosis: Yes Has patient had a PCN reaction that required hospitalization No Has patient had a PCN reaction occurring within the last 10 years: No If all of the above answers are "NO", then may proceed with Cephalosporin use. Other reaction(s): ANAPHYLAXIS Other reaction(s): ANAPHYLAX  . Penicillins Anaphylaxis, Swelling, Rash, Shortness Of Breath and Hives    Has patient had a PCN reaction causing immediate rash, facial/tongue/throat swelling, SOB or lightheadedness with hypotension: Yes Has patient had a PCN reaction causing severe rash involving  mucus membranes or skin necrosis: Yes Has patient had a PCN reaction that required hospitalization No Has patient had a PCN reaction occurring within the last 10 years: No If all of the above answers are "NO", then may proceed with Cephalosporin use.  Other reaction(s): ANAPHYLAXIS Other reaction(s): ANAPHYLAXIS Has patient had a PCN reaction causing immediate rash, facial/tongue/throat swelling, SOB or lightheadedness with hypotension: Yes Has patient had a PCN reaction causing severe rash involving mucus membranes or skin necrosis: Yes Has patient had a PCN reaction that required hospitalization No Has patient had a PCN reaction occurring within the last 10 years: No If all of the above answers are "NO", then may proceed with Cephalosporin use. Other reaction(s): ANAPHYLAXIS Other reaction(s): ANAPHYLAX Has patient had a PCN reaction causing immediate rash, facial/tongue/throat swelling, SOB or lightheadedness with hypotension: Yes Has patient had a PCN reaction causing severe rash involving mucus membranes or skin necrosis: Yes Has patient had a PCN reaction that required hospitalization No Has patient had a PCN reaction occurring within the last 10 years: No If all of the above answers are "NO", then may proceed with Cephalosporin use. Other reaction(s): ANAPHYLAXIS Other reaction(s): ANAPHYLAXIS   . Morphine Other (See Comments)    Muscle spasms  . Sulfa Antibiotics Nausea Only    Other reaction(s): VOMITING Other reaction(s): VOMITING Other reaction(s): VOMITING    Metabolic Disorder Labs: Lab Results  Component Value Date   HGBA1C 4.8 03/23/2014   No results found for: PROLACTIN No results found for: CHOL, TRIG, HDL, CHOLHDL, VLDL, LDLCALC Lab Results  Component Value Date   TSH  05/13/2009    3.163 (NOTE)  Please note change in reference ranges for ages 3W to 60Y. Test methodology is 3rd generation TSH   TSH 1.503 Test methodology is 3rd generation TSH 09/05/2008     Therapeutic Level Labs: No results found for: LITHIUM No results found for: VALPROATE No components found for:  CBMZ  Current Medications: Current Outpatient Medications  Medication Sig Dispense Refill  . acetaminophen (TYLENOL) 325 MG tablet Take 650 mg by mouth every 4 (four) hours as needed for moderate pain.     . ARIPiprazole (ABILIFY) 2 MG tablet TAKE 1 TABLET BY MOUTH DAILY FOR MOOD AND PARANOIA 30 tablet 3  . buPROPion (WELLBUTRIN XL) 150 MG 24 hr tablet TAKE 2  TABLETS(300 MG) BY MOUTH DAILY 60 tablet 3  . cetirizine-pseudoephedrine (ZYRTEC-D) 5-120 MG tablet Take 1 tablet by mouth daily. 30 tablet 0  . cyanocobalamin (,VITAMIN B-12,) 1000 MCG/ML injection Inject 1,000 mcg into the muscle every 30 (thirty) days.     . diphenoxylate-atropine (LOMOTIL) 2.5-0.025 MG tablet     . doxycycline (VIBRA-TABS) 100 MG tablet     . doxycycline (VIBRAMYCIN) 100 MG capsule Take 1 capsule (100 mg total) by mouth 2 (two) times daily. 14 capsule 0  . fluticasone (FLONASE) 50 MCG/ACT nasal spray Place 2 sprays into both nostrils daily. 16 g 0  . gabapentin (NEURONTIN) 300 MG capsule Take by mouth.    . hydrOXYzine (ATARAX/VISTARIL) 25 MG tablet TAKE 1/2 TO 1 TABLET(12.5 TO 25 MG) BY MOUTH TWICE DAILY AS NEEDED FOR ANXIETY 60 tablet 1  . ibuprofen (ADVIL,MOTRIN) 600 MG tablet Take 1 tablet (600 mg total) by mouth every 6 (six) hours as needed. 20 tablet 0  . lamoTRIgine (LAMICTAL) 150 MG tablet Take 1 tablet (150 mg total) by mouth daily. 30 tablet 2  . loratadine (CLARITIN) 10 MG tablet Take 10 mg by mouth daily.    . metoprolol succinate (TOPROL-XL) 25 MG 24 hr tablet Take by mouth.    . Multiple Vitamins-Minerals (HM MULTIVITAMIN ADULT GUMMY) CHEW Chew 2 tablets by mouth daily.    . ondansetron (ZOFRAN ODT) 4 MG disintegrating tablet Take 1 tablet (4 mg total) by mouth every 8 (eight) hours as needed. 20 tablet 0  . predniSONE (STERAPRED UNI-PAK 21 TAB) 10 MG (21) TBPK tablet Take 1 tablet by  mouth as directed  [place]    . SUMAtriptan (IMITREX) 100 MG tablet Take 100 mg by mouth every 2 (two) hours as needed for migraine or headache.     . thiamine (VITAMIN B-1) 100 MG tablet Take 100 mg by mouth.    . topiramate (TOPAMAX) 50 MG tablet TAKE 1 TABLET BY MOUTH IN THE MORNING, AND 2 TABLETS EVERY NIGHT AT BEDTIME    . Vilazodone HCl (VIIBRYD) 40 MG TABS TAKE 1 TABLET(40 MG) BY MOUTH DAILY 30 tablet 3  . [START ON 09/06/2019] zolpidem (AMBIEN CR) 12.5 MG CR tablet TAKE 1 TABLET(12.5 MG) BY MOUTH AT BEDTIME AS NEEDED FOR SLEEP 30 tablet 2   No current facility-administered medications for this visit.     Musculoskeletal: Strength & Muscle Tone: UTA Gait & Station: normal Patient leans: N/A  Psychiatric Specialty Exam: Review of Systems  Psychiatric/Behavioral: Negative for agitation, behavioral problems, confusion, decreased concentration, dysphoric mood, hallucinations, self-injury, sleep disturbance and suicidal ideas. The patient is nervous/anxious. The patient is not hyperactive.   All other systems reviewed and are negative.   There were no vitals taken for this visit.There is no height or weight on file to calculate BMI.  General Appearance: Casual  Eye Contact:  Fair  Speech:  Clear and Coherent  Volume:  Normal  Mood:  Anxious  Affect:  Congruent  Thought Process:  Goal Directed and Descriptions of Associations: Intact  Orientation:  Full (Time, Place, and Person)  Thought Content: Logical   Suicidal Thoughts:  No  Homicidal Thoughts:  No  Memory:  Immediate;   Fair Recent;   Fair Remote;   Fair  Judgement:  Fair  Insight:  Fair  Psychomotor Activity:  Normal  Concentration:  Concentration: Fair and Attention Span: Fair  Recall:  [employer] of Knowledge: Fair  Language: Fair  Akathisia:  No  Handed:  Right  AIMS (if indicated): UTA  Assets:  Communication Skills Desire for Improvement Housing Social Support  ADL's:  Intact  Cognition: WNL   Sleep:  Fair   Screenings:   Assessment and Plan: [name] is a 43 year old Caucasian female who has a history of depression, anxiety, insomnia was evaluated by telemedicine today.  Patient is currently struggling with anxiety symptoms due to multiple psychosocial stressors.  Patient has been noncompliant with psychotherapy sessions, encouraged her to do so.  Plan as noted below.   Plan MDD in remission Wellbutrin XL 300 mg p.o. daily-reduced dosage Viibryd 40 mg p.o. daily Lamictal as prescribed Abilify 2 mg p.o. daily.  Will try to taper off Abilify due to concerns of weight gain once she is more stable on the Lamictal and possibly with her psychotherapy appointments.  PTSD/panic disorder-improving She will continue to benefit from medication readjustment Increase lamotrigine to 150 mg p.o. daily Patient encouraged to establish care with Ms. Otila Kluver Thompson-therapist-patient has been noncompliant with recommendations for therapy. Hydroxyzine 12.5 to 25 mg p.o. twice daily as needed  Insomnia-stable Ambien CR 12.5 mg p.o. nightly Hydroxyzine 25 mg p.o. nightly as needed I have reviewed [place] controlled substance database  Follow-up in clinic in 6 weeks or sooner if needed.  I have spent atleast 20 minutes non face to face with patient today. More than 50 % of the time was spent for preparing to see the patient ( e.g., review of test, records ),  ordering medications and test ,psychoeducation and supportive psychotherapy and care coordination,as well as documenting clinical information in electronic health record. This note was generated in part or whole with voice recognition software. Voice recognition is usually quite accurate but there are transcription errors that can and very often do occur. I apologize for any typographical errors that were not detected and corrected.      Ursula Alert, MD 08/13/2019, 10:54 AM

## 2019-08-22 ENCOUNTER — Ambulatory Visit
Admission: EM | Admit: 2019-08-22 | Discharge: 2019-08-22 | Disposition: A | Discharge status: Home / Self Care | Payer: Medicare Other | Attending: Emergency Medicine | Admitting: Emergency Medicine

## 2019-08-22 ENCOUNTER — Other Ambulatory Visit: Payer: Self-pay

## 2019-08-22 DIAGNOSIS — R21 Rash and other nonspecific skin eruption: Secondary | ICD-10-CM | POA: Diagnosis not present

## 2019-08-22 MED ORDER — PREDNISONE 10 MG (21) PO TBPK: ORAL_TABLET | Freq: Every day | ORAL | 0 refills | Status: DC

## 2019-08-22 NOTE — ED Triage Notes (Signed)
Patient reports hives x4 days on extremities. Patient reports hx of neuropathy, so in unable to tell if the pain is chronic or acute. Reports her psychologist just increased her Lamitcal dose from 100mg  to 150mg  over the last few weeks and is concerned she could be having a reaction.   OTC benadryl the past two days with no relief.  Denies changes in laundry soaps, shampoo, conditioner, lotions, or body washes.

## 2019-08-22 NOTE — Discharge Instructions (Signed)
Take the prednisone and Benadryl as directed.    Follow up with your primary care provider if your symptoms are not improving.    

## 2019-08-22 NOTE — ED Provider Notes (Signed)
[name]    CSN: [id number] Arrival date & time: 08/22/19  1330      History   Chief Complaint Chief Complaint  Patient presents with  . Urticaria    HPI [name] is a 44 y.o. female.   Patient presents with a rash on her arms and legs x4 days.  She has been taking Benadryl with minimal relief.  She denies new medications, products, foods.  She does report an increase in her Lamictal dose recently but has been on this medication for years.  No difficulty swallowing or breathing.  She denies fever, chills, sore throat, cough, shortness of breath, abdominal pain, or other symptoms.  The history is provided by the patient.    Past Medical History:  Diagnosis Date  . Abnormal ECG   . Anemia   . Anxiety   . Arthritis   . B12 deficiency   . Cardiomyopathy ([place])   . Chronic abdominal pain   . Complication of anesthesia    difficulty to get sedated during endoscopy  . COVID-19 01/18/2019  . DDD (degenerative disc disease), lumbar   . Depression   . Diabetes mellitus without complication ([place])   . Dysrhythmia   . GERD (gastroesophageal reflux disease)   . Headache   . Iron deficiency   . Neuropathy   . Obesity   . Ovarian cyst   . Pneumonia    within past five years  . PTSD (post-traumatic stress disorder)   . PTSD (post-traumatic stress disorder)   . Rh negative status during pregnancy   . Seizures ([place])   . Small bowel obstruction [facility])     Patient Active Problem List   Diagnosis Date Noted  . MDD (major depressive disorder), recurrent, in full remission ([place]) 08/13/2019  . Insomnia due to medical condition 05/23/2019  . MDD (major depressive disorder), recurrent, severe, with psychosis ([place]) 03/12/2019  . MDD (major depressive disorder), recurrent episode, moderate ([place]) 09/15/2018  . Panic disorder 09/15/2018  . PTSD (post-traumatic stress disorder) 09/15/2018  . Insomnia due to mental condition 09/15/2018  . Panniculitis 08/01/2018  .  Neuropathy 07/26/2018  . Iron deficiency anemia 11/12/2015  . Uterine fibroid 09/30/2015  . Abnormal uterine bleeding 09/29/2015  . Abdominal pain, generalized 03/27/2015  . Degeneration of intervertebral disc of lumbar region 12/18/2014  . Abnormal ECG 10/09/2014  . Achilles tendinitis 10/09/2014  . Anxiety 10/09/2014  . DDD (degenerative disc disease), lumbar 10/09/2014  . Clinical depression 10/09/2014  . Class 1 obesity 10/09/2014  . Family planning 06/06/2014  . Fibroid 06/06/2014  . Bariatric surgery status 06/06/2014  . Avitaminosis D 04/22/2014  . Achilles bursitis 04/12/2014  . DDD (degenerative disc disease), lumbosacral 04/12/2014  . LBP (low back pain) 04/12/2014  . Abdominal pain, right upper quadrant 04/12/2014  . Degeneration of intervertebral disc of lumbosacral region 04/12/2014  . Diabetes mellitus arising in pregnancy 04/01/2014  . Personal history of surgery to heart and great vessels, presenting hazards to health 04/01/2014  . Other specified postprocedural states 04/01/2014  . Episode of syncope 02/04/2014  . Breathlessness on exertion 02/04/2014  . Awareness of heartbeats 12/11/2013  . Cardiomyopathy ([place]) 10/29/2013  . Dizziness 10/29/2013  . Mixed incontinence 08/31/2012  . Adiposity 08/31/2012  . Chronic pain associated with significant psychosocial dysfunction 05/12/2012  . Disorder of sacrum 05/12/2012  . Arthropathy of lumbar facet joint 05/12/2012  . Lumbar and sacral osteoarthritis 05/12/2012  . Arthralgia, sacroiliac 05/12/2012    Past Surgical History:  Procedure Laterality Date  . ABDOMINAL HYSTERECTOMY    . BILATERAL SALPINGECTOMY Bilateral 09/29/2015   Procedure: BILATERAL SALPINGECTOMY;  Surgeon: Benjaman Kindler, MD;  Location: [facility] ORS;  Service: Gynecology;  Laterality: Bilateral;  . bowel obstruction  2011  . CHOLECYSTECTOMY    . COLONOSCOPY WITH PROPOFOL N/A 01/24/2018   Procedure: COLONOSCOPY WITH PROPOFOL;  Surgeon: Toledo,  Benay Pike, MD;  Location: [facility] ENDOSCOPY;  Service: Gastroenterology;  Laterality: N/A;  . DILATION AND CURETTAGE OF UTERUS    . ESOPHAGOGASTRODUODENOSCOPY    . ESOPHAGOGASTRODUODENOSCOPY (EGD) WITH PROPOFOL N/A 01/24/2018   Procedure: ESOPHAGOGASTRODUODENOSCOPY (EGD) WITH PROPOFOL;  Surgeon: Toledo, Benay Pike, MD;  Location: [facility] ENDOSCOPY;  Service: Gastroenterology;  Laterality: N/A;  . GASTRIC BYPASS    . gi bleed  2009   Surgery-was in ICU for three weeks  . HERNIA REPAIR    . OVARIAN CYST REMOVAL Left 09/29/2015   Procedure: OVARIAN CYSTECTOMY;  Surgeon: Benjaman Kindler, MD;  Location: [facility] ORS;  Service: Gynecology;  Laterality: Left;  . ROUX-EN-Y PROCEDURE    . VAGINAL HYSTERECTOMY N/A 09/29/2015   Procedure: HYSTERECTOMY VAGINAL;  Surgeon: Benjaman Kindler, MD;  Location: [facility] ORS;  Service: Gynecology;  Laterality: N/A;    OB History   No obstetric history on file.      Home Medications    Prior to Admission medications   Medication Sig Start Date End Date Taking? Authorizing Provider  acetaminophen (TYLENOL) 325 MG tablet Take 650 mg by mouth every 4 (four) hours as needed for moderate pain.  04/03/14   [provider]  ARIPiprazole (ABILIFY) 2 MG tablet TAKE 1 TABLET BY MOUTH DAILY FOR MOOD AND PARANOIA 06/14/19   Ursula Alert, MD  buPROPion (WELLBUTRIN XL) 150 MG 24 hr tablet TAKE 2 TABLETS(300 MG) BY MOUTH DAILY 06/19/19   Ursula Alert, MD  cetirizine-pseudoephedrine (ZYRTEC-D) 5-120 MG tablet Take 1 tablet by mouth daily. 07/28/19   Lorin Picket, PA-C  cyanocobalamin (,VITAMIN B-12,) 1000 MCG/ML injection Inject 1,000 mcg into the muscle every 30 (thirty) days.  01/13/15   [provider]  diphenoxylate-atropine (LOMOTIL) 2.5-0.025 MG tablet  10/04/17   [provider]  doxycycline (VIBRA-TABS) 100 MG tablet  07/28/19   [provider]  doxycycline (VIBRAMYCIN) 100 MG capsule Take 1 capsule (100 mg total) by mouth 2 (two) times  daily. 07/28/19   Lorin Picket, PA-C  fluticasone (FLONASE) 50 MCG/ACT nasal spray Place 2 sprays into both nostrils daily. 07/28/19   Lorin Picket, PA-C  gabapentin (NEURONTIN) 300 MG capsule Take by mouth. 02/03/19   [provider]  hydrOXYzine (ATARAX/VISTARIL) 25 MG tablet TAKE 1/2 TO 1 TABLET(12.5 TO 25 MG) BY MOUTH TWICE DAILY AS NEEDED FOR ANXIETY 06/29/19   Ursula Alert, MD  ibuprofen (ADVIL,MOTRIN) 600 MG tablet Take 1 tablet (600 mg total) by mouth every 6 (six) hours as needed. 04/26/18   Rudene Re, MD  lamoTRIgine (LAMICTAL) 150 MG tablet Take 1 tablet (150 mg total) by mouth daily. 08/13/19   Ursula Alert, MD  loratadine (CLARITIN) 10 MG tablet Take 10 mg by mouth daily. 03/29/19   [provider]  metoprolol succinate (TOPROL-XL) 25 MG 24 hr tablet Take by mouth. 12/12/17 07/28/19  [provider]  Multiple Vitamins-Minerals (HM MULTIVITAMIN ADULT GUMMY) CHEW Chew 2 tablets by mouth daily.    [provider]  ondansetron (ZOFRAN ODT) 4 MG disintegrating tablet Take 1 tablet (4 mg total) by mouth every 8 (eight) hours as needed. 04/26/18  Alfred Levins, [place], MD  predniSONE (STERAPRED UNI-PAK 21 TAB) 10 MG (21) TBPK tablet Take by mouth daily. Take 6 tabs by mouth daily  for 1 day, then 5 tabs for 1 day, then 4 tabs for 1 day, then 3 tabs for 1 day, 2 tabs for 1 day, then 1 tab by mouth daily for 1 day 08/22/19   Sharion Balloon, NP  SUMAtriptan (IMITREX) 100 MG tablet Take 100 mg by mouth every 2 (two) hours as needed for migraine or headache.     [provider]  thiamine (VITAMIN B-1) 100 MG tablet Take 100 mg by mouth. 07/22/08   [provider]  topiramate (TOPAMAX) 50 MG tablet TAKE 1 TABLET BY MOUTH IN THE MORNING, AND 2 TABLETS EVERY NIGHT AT BEDTIME 11/01/17   [provider]  Vilazodone HCl (VIIBRYD) 40 MG TABS TAKE 1 TABLET(40 MG) BY MOUTH DAILY 05/23/19   Ursula Alert, MD  zolpidem (AMBIEN CR) 12.5 MG  CR tablet TAKE 1 TABLET(12.5 MG) BY MOUTH AT BEDTIME AS NEEDED FOR SLEEP 09/06/19   Ursula Alert, MD    Family History Family History  Problem Relation Age of Onset  . Arthritis/Rheumatoid Mother   . Heart block Mother   . Clotting disorder Mother   . Osteoporosis Mother   . Heart attack Mother   . Anxiety disorder Mother   . Depression Mother   . Heart disease Father   . Heart attack Father   . Depression Sister   . Anxiety disorder Sister     Social History Social History   Tobacco Use  . Smoking status: Never Smoker  . Smokeless tobacco: Never Used  Vaping Use  . Vaping Use: Never used  Substance Use Topics  . Alcohol use: No    Alcohol/week: 0.0 standard drinks  . Drug use: No     Allergies   Amoxicillin, Penicillins, Morphine, and Sulfa antibiotics   Review of Systems Review of Systems  Constitutional: Negative for chills and fever.  HENT: Negative for ear pain and sore throat.   Eyes: Negative for pain and visual disturbance.  Respiratory: Negative for cough and shortness of breath.   Cardiovascular: Negative for chest pain and palpitations.  Gastrointestinal: Negative for abdominal pain and vomiting.  Genitourinary: Negative for dysuria and hematuria.  Musculoskeletal: Negative for arthralgias and back pain.  Skin: Positive for rash. Negative for color change.  Neurological: Negative for seizures and syncope.  All other systems reviewed and are negative.    Physical Exam Triage Vital Signs ED Triage Vitals  Enc Vitals Group     BP      Pulse      Resp      Temp      Temp src      SpO2      Weight      Height      Head Circumference      Peak Flow      Pain Score      Pain Loc      Pain Edu?      Excl. in [place]?    No data found.  Updated Vital Signs BP 107/72   Pulse 94   Temp 98.7 F (37.1 C)   Resp 15   LMP  (LMP Unknown)   SpO2 97%   Visual Acuity Right Eye Distance:   Left Eye Distance:   Bilateral Distance:    Right  Eye Near:   Left Eye Near:  Bilateral Near:     Physical Exam Vitals and nursing note reviewed.  Constitutional:      General: She is not in acute distress.    Appearance: She is well-developed. She is not ill-appearing.  HENT:     Head: Normocephalic and atraumatic.     Mouth/Throat:     Mouth: Mucous membranes are moist.     Pharynx: Oropharynx is clear.     Comments: Speech clear.  No difficulty swallowing.   Eyes:     Conjunctiva/sclera: Conjunctivae normal.  Cardiovascular:     Rate and Rhythm: Normal rate and regular rhythm.     Heart sounds: Normal heart sounds. No murmur heard.   Pulmonary:     Effort: Pulmonary effort is normal. No respiratory distress.     Breath sounds: Normal breath sounds. No stridor. No wheezing or rhonchi.  Abdominal:     Palpations: Abdomen is soft.     Tenderness: There is no abdominal tenderness. There is no guarding or rebound.  Musculoskeletal:     Cervical back: Neck supple.  Skin:    General: Skin is warm and dry.     Findings: Rash present.     Comments: Pink papular, patchy rash on legs and arm.  No drainage.   Neurological:     General: No focal deficit present.     Mental Status: She is alert and oriented to person, place, and time.     Gait: Gait normal.  Psychiatric:        Mood and Affect: Mood normal.        Behavior: Behavior normal.      UC Treatments / Results  Labs (all labs ordered are listed, but only abnormal results are displayed) Labs Reviewed - No data to display  EKG   Radiology No results found.  Procedures Procedures (including critical care time)  Medications Ordered in UC Medications - No data to display  Initial Impression / Assessment and Plan / UC Course  I have reviewed the triage vital signs and the nursing notes.  Pertinent labs & imaging results that were available during my care of the patient were reviewed by me and considered in my medical decision making (see chart for  details).   Rash.  Treating with prednisone and Benadryl.  Precautions for drowsiness with Benadryl discussed with patient.  Instructed her to follow-up with her PCP if her symptoms or not improving.  Patient agrees to plan of care.     Final Clinical Impressions(s) / UC Diagnoses   Final diagnoses:  Rash     Discharge Instructions     Take the prednisone and Benadryl as directed.    Follow up with your primary care provider if your symptoms are not improving.       ED Prescriptions    Medication Sig Dispense Auth. Provider   predniSONE (STERAPRED UNI-PAK 21 TAB) 10 MG (21) TBPK tablet Take by mouth daily. Take 6 tabs by mouth daily  for 1 day, then 5 tabs for 1 day, then 4 tabs for 1 day, then 3 tabs for 1 day, 2 tabs for 1 day, then 1 tab by mouth daily for 1 day 21 tablet Sharion Balloon, NP     PDMP not reviewed this encounter.   Sharion Balloon, NP 08/22/19 1413

## 2019-08-24 ENCOUNTER — Other Ambulatory Visit: Payer: Self-pay | Admitting: Psychiatry

## 2019-08-24 DIAGNOSIS — F410 Panic disorder [episodic paroxysmal anxiety] without agoraphobia: Secondary | ICD-10-CM

## 2019-09-04 ENCOUNTER — Other Ambulatory Visit: Payer: Self-pay | Admitting: Psychiatry

## 2019-09-04 DIAGNOSIS — F4310 Post-traumatic stress disorder, unspecified: Secondary | ICD-10-CM

## 2019-09-04 DIAGNOSIS — F3342 Major depressive disorder, recurrent, in full remission: Secondary | ICD-10-CM

## 2019-09-04 DIAGNOSIS — F410 Panic disorder [episodic paroxysmal anxiety] without agoraphobia: Secondary | ICD-10-CM

## 2019-09-17 ENCOUNTER — Other Ambulatory Visit: Payer: Self-pay

## 2019-09-17 ENCOUNTER — Ambulatory Visit
Admission: EM | Admit: 2019-09-17 | Discharge: 2019-09-17 | Disposition: A | Discharge status: Home / Self Care | Payer: Medicare Other | Attending: Emergency Medicine | Admitting: Emergency Medicine

## 2019-09-17 DIAGNOSIS — J0101 Acute recurrent maxillary sinusitis: Secondary | ICD-10-CM

## 2019-09-17 MED ORDER — GUAIFENESIN ER 600 MG PO TB12: 1200.0000 mg | ORAL_TABLET | Freq: Two times a day (BID) | ORAL | 0 refills | Status: DC | PRN

## 2019-09-17 MED ORDER — AZITHROMYCIN 250 MG PO TABS: 250.0000 mg | ORAL_TABLET | Freq: Every day | ORAL | 0 refills | Status: DC

## 2019-09-17 NOTE — ED Triage Notes (Addendum)
Pt presents to UC for sinus pressures x 9days, worsening x4. Pt states she is experiencing ear pain and dental pain r/t sinus pressure. Pt also complaining of nasal congestion. Pt endorsing subjective fevers. Pt endorsing cough, denies sore throat. Pt states cough is productive with yellow phlegm. Pt has tried zyrtec, pseudoephedrine, and netti pot with out relief.

## 2019-09-17 NOTE — Discharge Instructions (Signed)
Take the Zithromax and Mucinex as directed.    Schedule an appointment with your primary care provider in 1 week for a recheck since your sinusitis is recurrent.

## 2019-09-17 NOTE — ED Provider Notes (Signed)
[name]    CSN: [id number] Arrival date & time: 09/17/19  3790      History   Chief Complaint Chief Complaint  Patient presents with   Nasal Congestion   Otalgia    HPI [name] is a 44 y.o. female.   Patient presents with 10-day history of sinus congestion.  She reports worsening symptoms x4 days; increased sinus pressure, facial pain, right ear pain, and increased congestion.  She felt warm but did not take her temperature.  She denies sore throat, shortness of breath, vomiting, diarrhea, rash, or other symptoms. Patient was treated with doxycycline for sinusitis on 07/28/2019 at [facility].    The history is provided by the patient.    Past Medical History:  Diagnosis Date   Abnormal ECG    Anemia    Anxiety    Arthritis    B12 deficiency    Cardiomyopathy ([place])    Chronic abdominal pain    Complication of anesthesia    difficulty to get sedated during endoscopy   COVID-19 01/18/2019   DDD (degenerative disc disease), lumbar    Depression    Diabetes mellitus without complication (HCC)    Dysrhythmia    GERD (gastroesophageal reflux disease)    Headache    Iron deficiency    Neuropathy    Obesity    Ovarian cyst    Pneumonia    within past five years   PTSD (post-traumatic stress disorder)    PTSD (post-traumatic stress disorder)    Rh negative status during pregnancy    Seizures ([place])    Small bowel obstruction [facility])     Patient Active Problem List   Diagnosis Date Noted   MDD (major depressive disorder), recurrent, in full remission ([place]) 08/13/2019   Insomnia due to medical condition 05/23/2019   MDD (major depressive disorder), recurrent, severe, with psychosis ([place]) 03/12/2019   MDD (major depressive disorder), recurrent episode, moderate ([place]) 09/15/2018   Panic disorder 09/15/2018   PTSD (post-traumatic stress disorder) 09/15/2018   Insomnia due to mental condition 09/15/2018    Panniculitis 08/01/2018   Neuropathy 07/26/2018   Iron deficiency anemia 11/12/2015   Uterine fibroid 09/30/2015   Abnormal uterine bleeding 09/29/2015   Abdominal pain, generalized 03/27/2015   Degeneration of intervertebral disc of lumbar region 12/18/2014   Abnormal ECG 10/09/2014   Achilles tendinitis 10/09/2014   Anxiety 10/09/2014   DDD (degenerative disc disease), lumbar 10/09/2014   Clinical depression 10/09/2014   Class 1 obesity 10/09/2014   Family planning 06/06/2014   Fibroid 06/06/2014   Bariatric surgery status 06/06/2014   Avitaminosis D 04/22/2014   Achilles bursitis 04/12/2014   DDD (degenerative disc disease), lumbosacral 04/12/2014   LBP (low back pain) 04/12/2014   Abdominal pain, right upper quadrant 04/12/2014   Degeneration of intervertebral disc of lumbosacral region 04/12/2014   Diabetes mellitus arising in pregnancy 04/01/2014   Personal history of surgery to heart and great vessels, presenting hazards to health 04/01/2014   Other specified postprocedural states 04/01/2014   Episode of syncope 02/04/2014   Breathlessness on exertion 02/04/2014   Awareness of heartbeats 12/11/2013   Cardiomyopathy ([place]) 10/29/2013   Dizziness 10/29/2013   Mixed incontinence 08/31/2012   Adiposity 08/31/2012   Chronic pain associated with significant psychosocial dysfunction 05/12/2012   Disorder of sacrum 05/12/2012   Arthropathy of lumbar facet joint 05/12/2012   Lumbar and sacral osteoarthritis 05/12/2012   Arthralgia, sacroiliac 05/12/2012    Past Surgical History:  Procedure  Laterality Date   ABDOMINAL HYSTERECTOMY     BILATERAL SALPINGECTOMY Bilateral 09/29/2015   Procedure: BILATERAL SALPINGECTOMY;  Surgeon: Benjaman Kindler, MD;  Location: [facility] ORS;  Service: Gynecology;  Laterality: Bilateral;   bowel obstruction  2011   CHOLECYSTECTOMY     COLONOSCOPY WITH PROPOFOL N/A 01/24/2018   Procedure: COLONOSCOPY WITH  PROPOFOL;  Surgeon: Toledo, Benay Pike, MD;  Location: [facility] ENDOSCOPY;  Service: Gastroenterology;  Laterality: N/A;   DILATION AND CURETTAGE OF UTERUS     ESOPHAGOGASTRODUODENOSCOPY     ESOPHAGOGASTRODUODENOSCOPY (EGD) WITH PROPOFOL N/A 01/24/2018   Procedure: ESOPHAGOGASTRODUODENOSCOPY (EGD) WITH PROPOFOL;  Surgeon: Toledo, Benay Pike, MD;  Location: [facility] ENDOSCOPY;  Service: Gastroenterology;  Laterality: N/A;   GASTRIC BYPASS     gi bleed  2009   Surgery-was in ICU for three weeks   HERNIA REPAIR     OVARIAN CYST REMOVAL Left 09/29/2015   Procedure: OVARIAN CYSTECTOMY;  Surgeon: Benjaman Kindler, MD;  Location: [facility] ORS;  Service: Gynecology;  Laterality: Left;   ROUX-EN-Y PROCEDURE     VAGINAL HYSTERECTOMY N/A 09/29/2015   Procedure: HYSTERECTOMY VAGINAL;  Surgeon: Benjaman Kindler, MD;  Location: [facility] ORS;  Service: Gynecology;  Laterality: N/A;    OB History   No obstetric history on file.      Home Medications    Prior to Admission medications   Medication Sig Start Date End Date Taking? Authorizing Provider  ARIPiprazole (ABILIFY) 2 MG tablet TAKE 1 TABLET BY MOUTH DAILY FOR MOOD AND PARANOIA 06/14/19  Yes Eappen, Ria Clock, MD  buPROPion (WELLBUTRIN XL) 150 MG 24 hr tablet TAKE 2 TABLETS(300 MG) BY MOUTH DAILY 06/19/19  Yes Eappen, Saramma, MD  cetirizine-pseudoephedrine (ZYRTEC-D) 5-120 MG tablet Take 1 tablet by mouth daily. 07/28/19  Yes Lorin Picket, PA-C  cyanocobalamin (,VITAMIN B-12,) 1000 MCG/ML injection Inject 1,000 mcg into the muscle every 30 (thirty) days.  01/13/15  Yes [provider]  diphenoxylate-atropine (LOMOTIL) 2.5-0.025 MG tablet  10/04/17  Yes [provider]  doxycycline (VIBRA-TABS) 100 MG tablet  07/28/19  Yes [provider]  gabapentin (NEURONTIN) 300 MG capsule Take by mouth. 02/03/19  Yes [provider]  hydrOXYzine (ATARAX/VISTARIL) 25 MG tablet TAKE 1/2 TO 1 TABLET(12.5 TO 25 MG) BY MOUTH TWICE DAILY AS  NEEDED FOR ANXIETY 08/24/19  Yes Eappen, Ria Clock, MD  lamoTRIgine (LAMICTAL) 150 MG tablet Take 1 tablet (150 mg total) by mouth daily. 08/13/19  Yes Eappen, Ria Clock, MD  ondansetron (ZOFRAN ODT) 4 MG disintegrating tablet Take 1 tablet (4 mg total) by mouth every 8 (eight) hours as needed. 04/26/18  Yes Alfred Levins, [place], MD  SUMAtriptan (IMITREX) 100 MG tablet Take 100 mg by mouth every 2 (two) hours as needed for migraine or headache.    Yes [provider]  Vilazodone HCl (VIIBRYD) 40 MG TABS TAKE 1 TABLET(40 MG) BY MOUTH DAILY 05/23/19  Yes Eappen, Saramma, MD  zolpidem (AMBIEN CR) 12.5 MG CR tablet TAKE 1 TABLET(12.5 MG) BY MOUTH AT BEDTIME AS NEEDED FOR SLEEP 09/06/19  Yes Eappen, Ria Clock, MD  acetaminophen (TYLENOL) 325 MG tablet Take 650 mg by mouth every 4 (four) hours as needed for moderate pain.  04/03/14   [provider]  azithromycin (ZITHROMAX) 250 MG tablet Take 1 tablet (250 mg total) by mouth daily. Take first 2 tablets together, then 1 every day until finished. 09/17/19   Sharion Balloon, NP  doxycycline (VIBRAMYCIN) 100 MG capsule Take 1 capsule (100 mg total) by mouth 2 (two) times daily.  07/28/19   Lorin Picket, PA-C  fluticasone (FLONASE) 50 MCG/ACT nasal spray Place 2 sprays into both nostrils daily. 07/28/19   Lorin Picket, PA-C  guaiFENesin (MUCINEX) 600 MG 12 hr tablet Take 2 tablets (1,200 mg total) by mouth 2 (two) times daily as needed. 09/17/19   Sharion Balloon, NP  ibuprofen (ADVIL,MOTRIN) 600 MG tablet Take 1 tablet (600 mg total) by mouth every 6 (six) hours as needed. 04/26/18   Rudene Re, MD  loratadine (CLARITIN) 10 MG tablet Take 10 mg by mouth daily. 03/29/19   [provider]  metoprolol succinate (TOPROL-XL) 25 MG 24 hr tablet Take by mouth. 12/12/17 07/28/19  [provider]  Multiple Vitamins-Minerals (HM MULTIVITAMIN ADULT GUMMY) CHEW Chew 2 tablets by mouth daily.    [provider]  predniSONE (STERAPRED  UNI-PAK 21 TAB) 10 MG (21) TBPK tablet Take by mouth daily. Take 6 tabs by mouth daily  for 1 day, then 5 tabs for 1 day, then 4 tabs for 1 day, then 3 tabs for 1 day, 2 tabs for 1 day, then 1 tab by mouth daily for 1 day 08/22/19   Sharion Balloon, NP  thiamine (VITAMIN B-1) 100 MG tablet Take 100 mg by mouth. 07/22/08   [provider]  topiramate (TOPAMAX) 50 MG tablet TAKE 1 TABLET BY MOUTH IN THE MORNING, AND 2 TABLETS EVERY NIGHT AT BEDTIME 11/01/17   [provider]    Family History Family History  Problem Relation Age of Onset   Arthritis/Rheumatoid Mother    Heart block Mother    Clotting disorder Mother    Osteoporosis Mother    Heart attack Mother    Anxiety disorder Mother    Depression Mother    Heart disease Father    Heart attack Father    Depression Sister    Anxiety disorder Sister     Social History Social History   Tobacco Use   Smoking status: Never Smoker   Smokeless tobacco: Never Used  Scientific laboratory technician Use: Never used  Substance Use Topics   Alcohol use: No    Alcohol/week: 0.0 standard drinks   Drug use: No     Allergies   Amoxicillin, Penicillins, Morphine, and Sulfa antibiotics   Review of Systems Review of Systems  Constitutional: Negative for chills and fever.  HENT: Positive for congestion, postnasal drip, rhinorrhea, sinus pressure and sinus pain. Negative for ear pain and sore throat.   Eyes: Negative for pain and visual disturbance.  Respiratory: Negative for cough and shortness of breath.   Cardiovascular: Negative for chest pain and palpitations.  Gastrointestinal: Negative for abdominal pain, diarrhea, nausea and vomiting.  Genitourinary: Negative for dysuria and hematuria.  Musculoskeletal: Negative for arthralgias and back pain.  Skin: Negative for color change and rash.  Neurological: Negative for seizures and syncope.  All other systems reviewed and are negative.    Physical Exam Triage  Vital Signs ED Triage Vitals  Enc Vitals Group     BP      Pulse      Resp      Temp      Temp src      SpO2      Weight      Height      Head Circumference      Peak Flow      Pain Score      Pain Loc      Pain Edu?  Excl. in [place]?    No data found.  Updated Vital Signs BP 106/71 (BP Location: Right Arm)    Pulse 92    Temp 98.3 F (36.8 C) (Oral)    Resp 16    LMP  (LMP Unknown)    SpO2 97%   Visual Acuity Right Eye Distance:   Left Eye Distance:   Bilateral Distance:    Right Eye Near:   Left Eye Near:    Bilateral Near:     Physical Exam Vitals and nursing note reviewed.  Constitutional:      General: She is not in acute distress.    Appearance: She is well-developed.  HENT:     Head: Normocephalic and atraumatic.     Right Ear: Tympanic membrane normal.     Left Ear: Tympanic membrane normal.     Nose: Congestion and rhinorrhea present.     Mouth/Throat:     Mouth: Mucous membranes are moist.     Pharynx: Oropharynx is clear.  Eyes:     Conjunctiva/sclera: Conjunctivae normal.  Cardiovascular:     Rate and Rhythm: Normal rate and regular rhythm.     Heart sounds: No murmur heard.   Pulmonary:     Effort: Pulmonary effort is normal. No respiratory distress.     Breath sounds: Normal breath sounds.  Abdominal:     Palpations: Abdomen is soft.     Tenderness: There is no abdominal tenderness. There is no guarding or rebound.  Musculoskeletal:     Cervical back: Neck supple.  Skin:    General: Skin is warm and dry.     Findings: No rash.  Neurological:     General: No focal deficit present.     Mental Status: She is alert and oriented to person, place, and time.     Gait: Gait normal.  Psychiatric:        Mood and Affect: Mood normal.        Behavior: Behavior normal.      UC Treatments / Results  Labs (all labs ordered are listed, but only abnormal results are displayed) Labs Reviewed - No data to display  EKG   Radiology No  results found.  Procedures Procedures (including critical care time)  Medications Ordered in UC Medications - No data to display  Initial Impression / Assessment and Plan / UC Course  I have reviewed the triage vital signs and the nursing notes.  Pertinent labs & imaging results that were available during my care of the patient were reviewed by me and considered in my medical decision making (see chart for details).   Acute recurrent sinusitis.  Treating with Zithromax and Mucinex.  (Patient was on doxycycline in June.)  Iinstructed patient to follow-up with her PCP in 1 week for recheck since her sinus infection is recurrent.  Discussed possible need for ENT.  Patient agrees to plan of care.     Final Clinical Impressions(s) / UC Diagnoses   Final diagnoses:  Acute recurrent maxillary sinusitis     Discharge Instructions     Take the Zithromax and Mucinex as directed.    Schedule an appointment with your primary care provider in 1 week for a recheck since your sinusitis is recurrent.           ED Prescriptions    Medication Sig Dispense Auth. Provider   guaiFENesin (MUCINEX) 600 MG 12 hr tablet Take 2 tablets (1,200 mg total) by mouth 2 (two) times daily as needed.  14 tablet Sharion Balloon, NP   azithromycin (ZITHROMAX) 250 MG tablet Take 1 tablet (250 mg total) by mouth daily. Take first 2 tablets together, then 1 every day until finished. 6 tablet Sharion Balloon, NP     PDMP not reviewed this encounter.   Sharion Balloon, NP 09/17/19 [id number]

## 2019-10-08 ENCOUNTER — Telehealth (INDEPENDENT_AMBULATORY_CARE_PROVIDER_SITE_OTHER): Payer: Medicare Other | Admitting: Psychiatry

## 2019-10-08 ENCOUNTER — Other Ambulatory Visit: Payer: Self-pay

## 2019-10-08 ENCOUNTER — Encounter: Payer: Self-pay | Admitting: Psychiatry

## 2019-10-08 DIAGNOSIS — F4310 Post-traumatic stress disorder, unspecified: Secondary | ICD-10-CM

## 2019-10-08 DIAGNOSIS — F410 Panic disorder [episodic paroxysmal anxiety] without agoraphobia: Secondary | ICD-10-CM

## 2019-10-08 DIAGNOSIS — Z79899 Other long term (current) drug therapy: Secondary | ICD-10-CM | POA: Insufficient documentation

## 2019-10-08 DIAGNOSIS — G4701 Insomnia due to medical condition: Secondary | ICD-10-CM | POA: Diagnosis not present

## 2019-10-08 DIAGNOSIS — F3342 Major depressive disorder, recurrent, in full remission: Secondary | ICD-10-CM

## 2019-10-08 MED ORDER — ARIPIPRAZOLE 2 MG PO TABS: ORAL_TABLET | ORAL | 3 refills | Status: DC

## 2019-10-08 MED ORDER — BUPROPION HCL ER (XL) 150 MG PO TB24: ORAL_TABLET | ORAL | 3 refills | Status: DC

## 2019-10-08 MED ORDER — VIIBRYD 40 MG PO TABS: ORAL_TABLET | ORAL | 3 refills | Status: DC

## 2019-10-08 NOTE — Progress Notes (Signed)
Provider Location : [facility] Patient Location : [place]  Participants: Patient , Provider  Virtual Visit via Video [name]  I connected with [name] on 10/08/19 at 10:40 AM EDT by a video enabled telemedicine application and verified that I am speaking with the correct person using two identifiers.   I discussed the limitations of evaluation and management by telemedicine and the availability of in person appointments. The patient expressed understanding and agreed to proceed.    I discussed the assessment and treatment plan with the patient. The patient was provided an opportunity to ask questions and all were answered. The patient agreed with the plan and demonstrated an understanding of the instructions.   The patient was advised to call back or seek an in-person evaluation if the symptoms worsen or if the condition fails to improve as anticipated.  [name]  10/08/2019 3:44 PM [name]  MRN:  [id number]  Chief Complaint:  Chief Complaint    Follow-up     HPI: [name] is a 44 year old Caucasian female, divorced, lives in [place], has a history of MDD, PTSD, panic attacks, insomnia was evaluated by telemedicine today.  Patient today reports she is currently making progress with regards to her mood symptoms.  She is compliant on medications as prescribed.  She reports sleep continues to be good on the Ambien.  Patient reports her daughter has started school and it is going well.  She reports her anxiety hence is more under control since her daughter is getting used to the school.  Patient reports she continues to have residual symptoms from her recent ear infection and may need an ENT evaluation soon.  Patient denies any suicidality, homicidality or perceptual disturbances.  Patient denies any other concerns today.  Visit Diagnosis:    ICD-10-CM   1. MDD (major depressive disorder), recurrent, in full remission ([place])  F33.42  ARIPiprazole (ABILIFY) 2 MG tablet    TSH  2. Panic disorder  F41.0 Vilazodone HCl (VIIBRYD) 40 MG TABS  3. PTSD (post-traumatic stress disorder)  F43.10 buPROPion (WELLBUTRIN XL) 150 MG 24 hr tablet    Vilazodone HCl (VIIBRYD) 40 MG TABS  4. Insomnia due to medical condition  G47.01   5. High risk medication use  Z79.899 Lipid panel    Hemoglobin A1C    Prolactin    CBC With Diff/Platelet    Past Psychiatric History: I have reviewed past psychiatric history from my progress [name] on 05/09/2017  Past Medical History:  Past Medical History:  Diagnosis Date  . Abnormal ECG   . Anemia   . Anxiety   . Arthritis   . B12 deficiency   . Cardiomyopathy ([place])   . Chronic abdominal pain   . Complication of anesthesia    difficulty to get sedated during endoscopy  . COVID-19 01/18/2019  . DDD (degenerative disc disease), lumbar   . Depression   . Diabetes mellitus without complication ([place])   . Dysrhythmia   . GERD (gastroesophageal reflux disease)   . Headache   . Iron deficiency   . Neuropathy   . Obesity   . Ovarian cyst   . Pneumonia    within past five years  . PTSD (post-traumatic stress disorder)   . PTSD (post-traumatic stress disorder)   . Rh negative status during pregnancy   . Seizures ([place])   . Small bowel obstruction [facility])     Past Surgical History:  Procedure Laterality Date  . ABDOMINAL HYSTERECTOMY    .  BILATERAL SALPINGECTOMY Bilateral 09/29/2015   Procedure: BILATERAL SALPINGECTOMY;  Surgeon: Benjaman Kindler, MD;  Location: [facility] ORS;  Service: Gynecology;  Laterality: Bilateral;  . bowel obstruction  2011  . CHOLECYSTECTOMY    . COLONOSCOPY WITH PROPOFOL N/A 01/24/2018   Procedure: COLONOSCOPY WITH PROPOFOL;  Surgeon: Toledo, Benay Pike, MD;  Location: [facility] ENDOSCOPY;  Service: Gastroenterology;  Laterality: N/A;  . DILATION AND CURETTAGE OF UTERUS    . ESOPHAGOGASTRODUODENOSCOPY    . ESOPHAGOGASTRODUODENOSCOPY (EGD) WITH PROPOFOL N/A 01/24/2018    Procedure: ESOPHAGOGASTRODUODENOSCOPY (EGD) WITH PROPOFOL;  Surgeon: Toledo, Benay Pike, MD;  Location: [facility] ENDOSCOPY;  Service: Gastroenterology;  Laterality: N/A;  . GASTRIC BYPASS    . gi bleed  2009   Surgery-was in ICU for three weeks  . HERNIA REPAIR    . OVARIAN CYST REMOVAL Left 09/29/2015   Procedure: OVARIAN CYSTECTOMY;  Surgeon: Benjaman Kindler, MD;  Location: [facility] ORS;  Service: Gynecology;  Laterality: Left;  . ROUX-EN-Y PROCEDURE    . VAGINAL HYSTERECTOMY N/A 09/29/2015   Procedure: HYSTERECTOMY VAGINAL;  Surgeon: Benjaman Kindler, MD;  Location: [facility] ORS;  Service: Gynecology;  Laterality: N/A;    Family Psychiatric History: I have reviewed family psychiatric history from my progress [name] on 05/09/2017  Family History:  Family History  Problem Relation Age of Onset  . Arthritis/Rheumatoid Mother   . Heart block Mother   . Clotting disorder Mother   . Osteoporosis Mother   . Heart attack Mother   . Anxiety disorder Mother   . Depression Mother   . Heart disease Father   . Heart attack Father   . Depression Sister   . Anxiety disorder Sister     Social History: Reviewed social history from my progress [name] on 05/09/2017 Social History   Socioeconomic History  . Marital status: Single    Spouse name: Not on file  . Number of children: Not on file  . Years of education: Not on file  . Highest education level: Not on file  Occupational History  . Not on file  Tobacco Use  . Smoking status: Never Smoker  . Smokeless tobacco: Never Used  Vaping Use  . Vaping Use: Never used  Substance and Sexual Activity  . Alcohol use: No    Alcohol/week: 0.0 standard drinks  . Drug use: No  . Sexual activity: Yes    Partners: Male    Birth control/protection: Surgical  Other Topics Concern  . Not on file  Social History Narrative  . Not on file   Social Determinants of Health   Financial Resource Strain:   . Difficulty of Paying Living Expenses: Not on file  Food  Insecurity:   . Worried About Charity fundraiser in the Last Year: Not on file  . Ran Out of Food in the Last Year: Not on file  Transportation Needs:   . Lack of Transportation (Medical): Not on file  . Lack of Transportation (Non-Medical): Not on file  Physical Activity:   . Days of Exercise per Week: Not on file  . Minutes of Exercise per Session: Not on file  Stress:   . Feeling of Stress : Not on file  Social Connections:   . Frequency of Communication with Friends and Family: Not on file  . Frequency of Social Gatherings with Friends and Family: Not on file  . Attends Religious Services: Not on file  . Active Member of Clubs or Organizations: Not on file  . Attends Archivist Meetings:  Not on file  . Marital Status: Not on file    Allergies:  Allergies  Allergen Reactions  . Amoxicillin Anaphylaxis, Hives, Shortness Of Breath, Swelling and Rash    Has patient had a PCN reaction causing immediate rash, facial/tongue/throat swelling, SOB or lightheadedness with hypotension: Yes Has patient had a PCN reaction causing severe rash involving mucus membranes or skin necrosis: Yes Has patient had a PCN reaction that required hospitalization No Has patient had a PCN reaction occurring within the last 10 years: No If all of the above answers are "NO", then may proceed with Cephalosporin use. Other reaction(s): ANAPHYLAXIS Other reaction(s): ANAPHYLAX  . Penicillins Anaphylaxis, Swelling, Rash, Shortness Of Breath and Hives    Has patient had a PCN reaction causing immediate rash, facial/tongue/throat swelling, SOB or lightheadedness with hypotension: Yes Has patient had a PCN reaction causing severe rash involving mucus membranes or skin necrosis: Yes Has patient had a PCN reaction that required hospitalization No Has patient had a PCN reaction occurring within the last 10 years: No If all of the above answers are "NO", then may proceed with Cephalosporin use.  Other  reaction(s): ANAPHYLAXIS Other reaction(s): ANAPHYLAXIS Has patient had a PCN reaction causing immediate rash, facial/tongue/throat swelling, SOB or lightheadedness with hypotension: Yes Has patient had a PCN reaction causing severe rash involving mucus membranes or skin necrosis: Yes Has patient had a PCN reaction that required hospitalization No Has patient had a PCN reaction occurring within the last 10 years: No If all of the above answers are "NO", then may proceed with Cephalosporin use. Other reaction(s): ANAPHYLAXIS Other reaction(s): ANAPHYLAX Has patient had a PCN reaction causing immediate rash, facial/tongue/throat swelling, SOB or lightheadedness with hypotension: Yes Has patient had a PCN reaction causing severe rash involving mucus membranes or skin necrosis: Yes Has patient had a PCN reaction that required hospitalization No Has patient had a PCN reaction occurring within the last 10 years: No If all of the above answers are "NO", then may proceed with Cephalosporin use. Other reaction(s): ANAPHYLAXIS Other reaction(s): ANAPHYLAXIS   . Morphine Other (See Comments)    Muscle spasms  . Sulfa Antibiotics Nausea Only    Other reaction(s): VOMITING Other reaction(s): VOMITING Other reaction(s): VOMITING    Metabolic Disorder Labs: Lab Results  Component Value Date   HGBA1C 4.8 03/23/2014   No results found for: PROLACTIN No results found for: CHOL, TRIG, HDL, CHOLHDL, VLDL, LDLCALC Lab Results  Component Value Date   TSH  05/13/2009    3.163 ([name])  Please [name] change in reference ranges for ages 68W to 3Y. Test methodology is 3rd generation TSH   TSH 1.503Test methodology is 3rd generation TSH 09/05/2008    Therapeutic Level Labs: No results found for: LITHIUM No results found for: VALPROATE No components found for:  CBMZ  Current Medications: Current Outpatient Medications  Medication Sig Dispense Refill  . acetaminophen (TYLENOL) 325 MG tablet Take 650  mg by mouth every 4 (four) hours as needed for moderate pain.     . ARIPiprazole (ABILIFY) 2 MG tablet TAKE 1 BY MOUTH DAILY 30 tablet 3  . azithromycin (ZITHROMAX) 250 MG tablet Take 1 tablet (250 mg total) by mouth daily. Take first 2 tablets together, then 1 every day until finished. 6 tablet 0  . buPROPion (WELLBUTRIN XL) 150 MG 24 hr tablet TAKE 2 TABLETS(300 MG) BY MOUTH DAILY 60 tablet 3  . cetirizine-pseudoephedrine (ZYRTEC-D) 5-120 MG tablet Take 1 tablet by mouth daily. 30 tablet 0  .  cyanocobalamin (,VITAMIN B-12,) 1000 MCG/ML injection Inject 1,000 mcg into the muscle every 30 (thirty) days.     . diphenoxylate-atropine (LOMOTIL) 2.5-0.025 MG tablet     . doxycycline (MONODOX) 100 MG capsule     . doxycycline (VIBRA-TABS) 100 MG tablet     . doxycycline (VIBRAMYCIN) 100 MG capsule Take 1 capsule (100 mg total) by mouth 2 (two) times daily. 14 capsule 0  . fluticasone (FLONASE) 50 MCG/ACT nasal spray Place 2 sprays into both nostrils daily. 16 g 0  . gabapentin (NEURONTIN) 300 MG capsule Take by mouth.    [name] guaiFENesin (MUCINEX) 600 MG 12 hr tablet Take 2 tablets (1,200 mg total) by mouth 2 (two) times daily as needed. 14 tablet 0  . hydrOXYzine (ATARAX/VISTARIL) 25 MG tablet TAKE 1/2 TO 1 TABLET(12.5 TO 25 MG) BY MOUTH TWICE DAILY AS NEEDED FOR ANXIETY 60 tablet 1  . ibuprofen (ADVIL,MOTRIN) 600 MG tablet Take 1 tablet (600 mg total) by mouth every 6 (six) hours as needed. 20 tablet 0  . lamoTRIgine (LAMICTAL) 150 MG tablet Take 1 tablet (150 mg total) by mouth daily. 30 tablet 2  . loratadine (CLARITIN) 10 MG tablet Take 10 mg by mouth daily.    . metoprolol succinate (TOPROL-XL) 25 MG 24 hr tablet Take by mouth.    . Multiple Vitamins-Minerals (HM MULTIVITAMIN ADULT GUMMY) CHEW Chew 2 tablets by mouth daily.    . ondansetron (ZOFRAN ODT) 4 MG disintegrating tablet Take 1 tablet (4 mg total) by mouth every 8 (eight) hours as needed. 20 tablet 0  . predniSONE (STERAPRED UNI-PAK 21  TAB) 10 MG (21) TBPK tablet Take by mouth daily. Take 6 tabs by mouth daily  for 1 day, then 5 tabs for 1 day, then 4 tabs for 1 day, then 3 tabs for 1 day, 2 tabs for 1 day, then 1 tab by mouth daily for 1 day 21 tablet 0  . SUMAtriptan (IMITREX) 100 MG tablet Take 100 mg by mouth every 2 (two) hours as needed for migraine or headache.     . thiamine (VITAMIN B-1) 100 MG tablet Take 100 mg by mouth.    . topiramate (TOPAMAX) 50 MG tablet TAKE 1 TABLET BY MOUTH IN THE MORNING, AND 2 TABLETS EVERY NIGHT AT BEDTIME    . Vilazodone HCl (VIIBRYD) 40 MG TABS TAKE 1 TABLET(40 MG) BY MOUTH DAILY 30 tablet 3  . zolpidem (AMBIEN CR) 12.5 MG CR tablet TAKE 1 TABLET(12.5 MG) BY MOUTH AT BEDTIME AS NEEDED FOR SLEEP 30 tablet 2   No current facility-administered medications for this visit.     Musculoskeletal: Strength & Muscle Tone: UTA Gait & Station: normal Patient leans: N/A  Psychiatric Specialty Exam: Review of Systems  HENT: Positive for tinnitus (on and off).   Psychiatric/Behavioral: Negative for agitation, behavioral problems, confusion, decreased concentration, dysphoric mood, hallucinations, self-injury, sleep disturbance and suicidal ideas. The patient is not nervous/anxious and is not hyperactive.   All other systems reviewed and are negative.   There were no vitals taken for this visit.There is no height or weight on file to calculate BMI.  General Appearance: Casual  Eye Contact:  Fair  Speech:  Clear and Coherent  Volume:  Normal  Mood:  Euthymic  Affect:  Congruent  Thought Process:  Goal Directed and Descriptions of Associations: Intact  Orientation:  Full (Time, Place, and Person)  Thought Content: Logical   Suicidal Thoughts:  No  Homicidal Thoughts:  No  Memory:  Immediate;   Fair Recent;   Fair Remote;   Fair  Judgement:  Fair  Insight:  Fair  Psychomotor Activity:  Normal  Concentration:  Concentration: Fair and Attention Span: Fair  Recall:  [employer] of  Knowledge: Fair  Language: Fair  Akathisia:  No  Handed:  Right  AIMS (if indicated):UTA  Assets:  Communication Skills Desire for Improvement Housing Social Support  ADL's:  Intact  Cognition: WNL  Sleep:  Fair   Screenings:   Assessment and Plan: [name] is a 44 year old Caucasian female who has a history of depression, anxiety, insomnia was evaluated by telemedicine today.  Patient is currently making progress on the current medication regimen.  Plan as noted below.  Plan MDD in remission Wellbutrin XL 300 mg p.o. daily-reduced dosage Viibryd 40 mg p.o. daily Lamictal as prescribed Abilify 2 mg p.o. daily.  The long-term goal is to taper her off of Abilify.  PTSD/panic disorder-stable Lamotrigine 150 mg p.o. daily Continue psychotherapy sessions as needed.  Offered to refer patient to our practice since her previous therapist will not accept her health insurance plan anymore.  Patient declined.  Insomnia-stable Ambien CR 12.5 mg p.o. nightly Hydroxyzine 25 mg p.o. nightly as needed I have reviewed [place] controlled substance database  High risk medication use-will order the following labs-TSH, lipid panel, hemoglobin A1c, prolactin, CBC with differential.  Patient will have the labs done at her primary care doctor's office.  Follow-up in clinic in 6 to 8 weeks or sooner if needed.  I have spent atleast 20 minutes face to face with patient today. More than 50 % of the time was spent for preparing to see the patient ( e.g., review of test, records ),ordering medications and test ,psychoeducation and supportive psychotherapy and care coordination,as well as documenting clinical information in electronic health record. This [name] was generated in part or whole with voice recognition software. Voice recognition is usually quite accurate but there are transcription errors that can and very often do occur. I apologize for any typographical errors that were not detected and  corrected.        Ursula Alert, MD 10/08/2019, 3:44 PM

## 2019-10-25 ENCOUNTER — Other Ambulatory Visit: Payer: Self-pay | Admitting: Psychiatry

## 2019-10-25 DIAGNOSIS — F410 Panic disorder [episodic paroxysmal anxiety] without agoraphobia: Secondary | ICD-10-CM

## 2019-11-20 ENCOUNTER — Other Ambulatory Visit: Payer: Self-pay | Admitting: Psychiatry

## 2019-11-20 DIAGNOSIS — F4310 Post-traumatic stress disorder, unspecified: Secondary | ICD-10-CM

## 2019-11-20 DIAGNOSIS — F3342 Major depressive disorder, recurrent, in full remission: Secondary | ICD-10-CM

## 2019-11-20 DIAGNOSIS — F410 Panic disorder [episodic paroxysmal anxiety] without agoraphobia: Secondary | ICD-10-CM

## 2019-11-21 ENCOUNTER — Telehealth: Payer: Self-pay

## 2019-11-21 ENCOUNTER — Other Ambulatory Visit: Payer: Self-pay | Admitting: Psychiatry

## 2019-11-21 DIAGNOSIS — F4310 Post-traumatic stress disorder, unspecified: Secondary | ICD-10-CM

## 2019-11-21 DIAGNOSIS — F3342 Major depressive disorder, recurrent, in full remission: Secondary | ICD-10-CM

## 2019-11-21 DIAGNOSIS — F410 Panic disorder [episodic paroxysmal anxiety] without agoraphobia: Secondary | ICD-10-CM

## 2019-11-21 MED ORDER — ZOLPIDEM TARTRATE ER 12.5 MG PO TBCR: EXTENDED_RELEASE_TABLET | ORAL | 2 refills | Status: DC

## 2019-11-21 NOTE — Telephone Encounter (Signed)
I have sent Ambien to pharmacy.  She has enough refills on all her other medications.

## 2019-11-21 NOTE — Telephone Encounter (Signed)
pt called states she needs refills on her medication Lorrin Mais

## 2019-12-10 ENCOUNTER — Telehealth: Payer: Self-pay | Admitting: Psychiatry

## 2019-12-10 NOTE — Telephone Encounter (Signed)
I have printed out labs-TSH, lipid panel, hemoglobin A1c, prolactin, CBC with differential.  We will have [facility] fax it to [facility] since patient request it.

## 2019-12-11 ENCOUNTER — Other Ambulatory Visit
Admission: RE | Admit: 2019-12-11 | Discharge: 2019-12-11 | Disposition: A | Discharge status: Home / Self Care | Payer: Medicare Other | Attending: Psychiatry | Admitting: Psychiatry

## 2019-12-11 DIAGNOSIS — F3342 Major depressive disorder, recurrent, in full remission: Secondary | ICD-10-CM | POA: Diagnosis present

## 2019-12-11 DIAGNOSIS — Z79899 Other long term (current) drug therapy: Secondary | ICD-10-CM | POA: Diagnosis not present

## 2019-12-11 LAB — CBC WITH DIFFERENTIAL/PLATELET
Abs Immature Granulocytes: 0.01 10*3/uL (ref 0.00–0.07)
Basophils Absolute: 0.1 10*3/uL (ref 0.0–0.1)
Basophils Relative: 2 %
Eosinophils Absolute: 0.2 10*3/uL (ref 0.0–0.5)
Eosinophils Relative: 3 %
HCT: 37.8 % (ref 36.0–46.0)
Hemoglobin: 11.7 g/dL — ABNORMAL LOW (ref 12.0–15.0)
Immature Granulocytes: 0 %
Lymphocytes Relative: 30 %
Lymphs Abs: 1.7 10*3/uL (ref 0.7–4.0)
MCH: 25.5 pg — ABNORMAL LOW (ref 26.0–34.0)
MCHC: 31 g/dL (ref 30.0–36.0)
MCV: 82.5 fL (ref 80.0–100.0)
Monocytes Absolute: 0.5 10*3/uL (ref 0.1–1.0)
Monocytes Relative: 8 %
Neutro Abs: 3.2 10*3/uL (ref 1.7–7.7)
Neutrophils Relative %: 57 %
Platelets: 381 10*3/uL (ref 150–400)
RBC: 4.58 MIL/uL (ref 3.87–5.11)
RDW: 14.8 % (ref 11.5–15.5)
WBC: 5.5 10*3/uL (ref 4.0–10.5)
nRBC: 0 % (ref 0.0–0.2)

## 2019-12-11 LAB — LIPID PANEL
Cholesterol: 162 mg/dL (ref 0–200)
HDL: 87 mg/dL (ref >40)
LDL Cholesterol: 65 mg/dL (ref 0–99)
Total CHOL/HDL Ratio: 1.9 RATIO
Triglycerides: 51 mg/dL (ref <150)
VLDL: 10 mg/dL (ref 0–40)

## 2019-12-11 LAB — HEMOGLOBIN A1C
Hgb A1c MFr Bld: 5.5 % (ref 4.8–5.6)
Mean Plasma Glucose: 111 mg/dL

## 2019-12-11 LAB — TSH: TSH: 1.886 u[IU]/mL (ref 0.350–4.500)

## 2019-12-11 NOTE — Telephone Encounter (Signed)
labwork orders for the lipid , a1c, prolactin, cbc with diff, tsh. faxed and confirmed.

## 2019-12-12 ENCOUNTER — Encounter: Payer: Self-pay | Admitting: Psychiatry

## 2019-12-12 ENCOUNTER — Telehealth: Payer: Self-pay | Admitting: *Deleted

## 2019-12-12 ENCOUNTER — Other Ambulatory Visit: Payer: Self-pay

## 2019-12-12 ENCOUNTER — Telehealth (INDEPENDENT_AMBULATORY_CARE_PROVIDER_SITE_OTHER): Payer: Medicare Other | Admitting: Psychiatry

## 2019-12-12 DIAGNOSIS — G4701 Insomnia due to medical condition: Secondary | ICD-10-CM

## 2019-12-12 DIAGNOSIS — F410 Panic disorder [episodic paroxysmal anxiety] without agoraphobia: Secondary | ICD-10-CM

## 2019-12-12 DIAGNOSIS — F4310 Post-traumatic stress disorder, unspecified: Secondary | ICD-10-CM

## 2019-12-12 DIAGNOSIS — F3342 Major depressive disorder, recurrent, in full remission: Secondary | ICD-10-CM

## 2019-12-12 DIAGNOSIS — Z79899 Other long term (current) drug therapy: Secondary | ICD-10-CM

## 2019-12-12 LAB — PROLACTIN: Prolactin: 12.1 ng/mL (ref 4.8–23.3)

## 2019-12-12 MED ORDER — VIIBRYD 40 MG PO TABS: ORAL_TABLET | ORAL | 3 refills | Status: DC

## 2019-12-12 MED ORDER — HYDROXYZINE HCL 25 MG PO TABS: ORAL_TABLET | ORAL | 2 refills | Status: DC

## 2019-12-12 MED ORDER — ARIPIPRAZOLE 2 MG PO TABS: ORAL_TABLET | ORAL | 3 refills | Status: DC

## 2019-12-12 MED ORDER — BUPROPION HCL ER (XL) 150 MG PO TB24: ORAL_TABLET | ORAL | 3 refills | Status: DC

## 2019-12-12 NOTE — Progress Notes (Signed)
Virtual Visit via Video Note  I connected with [name] on 12/12/19 at 10:40 AM EDT by a video enabled telemedicine application and verified that I am speaking with the correct person using two identifiers.  Location Provider Location : [facility] Patient Location : Car  Participants: Patient , Provider    I discussed the limitations of evaluation and management by telemedicine and the availability of in person appointments. The patient expressed understanding and agreed to proceed.    I discussed the assessment and treatment plan with the patient. The patient was provided an opportunity to ask questions and all were answered. The patient agreed with the plan and demonstrated an understanding of the instructions.   The patient was advised to call back or seek an in-person evaluation if the symptoms worsen or if the condition fails to improve as anticipated.   [place] MD OP Progress Note  12/12/2019 1:38 PM STEPANIE GRAVER  MRN:  [id number]  Chief Complaint:  Chief Complaint    Follow-up     HPI: [name] is a 44 year old Caucasian female, divorced, lives in [place], has a history of MDD, PTSD, panic attacks, insomnia was evaluated by telemedicine today.  Patient today reports she is currently stable on current medication regimen.  She describes her mood symptoms as stable.  She denies any sadness or crying spells.  She is not anxious.  She reports sleep continues to be good on the Ambien.  She is compliant on her medications and denies side effects.  She is spending time with her mother.  She looks forward to Thanksgiving and Christmas.  She is not interested in tapering off of the Abilify yet and wants to get Christmas and Thanksgiving before she wants to try doing that.  Patient denies any other concerns today.  Visit Diagnosis:    ICD-10-CM   1. MDD (major depressive disorder), recurrent, in full remission ([place])  F33.42 ARIPiprazole (ABILIFY) 2 MG tablet   2. Panic disorder  F41.0 hydrOXYzine (ATARAX/VISTARIL) 25 MG tablet    Vilazodone HCl (VIIBRYD) 40 MG TABS  3. PTSD (post-traumatic stress disorder)  F43.10 buPROPion (WELLBUTRIN XL) 150 MG 24 hr tablet    Vilazodone HCl (VIIBRYD) 40 MG TABS  4. Insomnia due to medical condition  G47.01   5. High risk medication use  Z79.899     Past Psychiatric History: I have reviewed past psychiatric history from my progress note on 05/09/2017.  Past Medical History:  Past Medical History:  Diagnosis Date  . Abnormal ECG   . Anemia   . Anxiety   . Arthritis   . B12 deficiency   . Cardiomyopathy ([place])   . Chronic abdominal pain   . Complication of anesthesia    difficulty to get sedated during endoscopy  . COVID-19 01/18/2019  . DDD (degenerative disc disease), lumbar   . Depression   . Diabetes mellitus without complication ([place])   . Dysrhythmia   . GERD (gastroesophageal reflux disease)   . Headache   . Iron deficiency   . Neuropathy   . Obesity   . Ovarian cyst   . Pneumonia    within past five years  . PTSD (post-traumatic stress disorder)   . PTSD (post-traumatic stress disorder)   . Rh negative status during pregnancy   . Seizures ([place])   . Small bowel obstruction [facility])     Past Surgical History:  Procedure Laterality Date  . ABDOMINAL HYSTERECTOMY    . BILATERAL SALPINGECTOMY Bilateral 09/29/2015  Procedure: BILATERAL SALPINGECTOMY;  Surgeon: Benjaman Kindler, MD;  Location: [facility] ORS;  Service: Gynecology;  Laterality: Bilateral;  . bowel obstruction  2011  . CHOLECYSTECTOMY    . COLONOSCOPY WITH PROPOFOL N/A 01/24/2018   Procedure: COLONOSCOPY WITH PROPOFOL;  Surgeon: Toledo, Benay Pike, MD;  Location: [facility] ENDOSCOPY;  Service: Gastroenterology;  Laterality: N/A;  . DILATION AND CURETTAGE OF UTERUS    . ESOPHAGOGASTRODUODENOSCOPY    . ESOPHAGOGASTRODUODENOSCOPY (EGD) WITH PROPOFOL N/A 01/24/2018   Procedure: ESOPHAGOGASTRODUODENOSCOPY (EGD) WITH PROPOFOL;  Surgeon:  Toledo, Benay Pike, MD;  Location: [facility] ENDOSCOPY;  Service: Gastroenterology;  Laterality: N/A;  . GASTRIC BYPASS    . gi bleed  2009   Surgery-was in ICU for three weeks  . HERNIA REPAIR    . OVARIAN CYST REMOVAL Left 09/29/2015   Procedure: OVARIAN CYSTECTOMY;  Surgeon: Benjaman Kindler, MD;  Location: [facility] ORS;  Service: Gynecology;  Laterality: Left;  . ROUX-EN-Y PROCEDURE    . VAGINAL HYSTERECTOMY N/A 09/29/2015   Procedure: HYSTERECTOMY VAGINAL;  Surgeon: Benjaman Kindler, MD;  Location: [facility] ORS;  Service: Gynecology;  Laterality: N/A;    Family Psychiatric History: I have reviewed family psychiatric history from my progress note on 05/09/2017.  Family History:  Family History  Problem Relation Age of Onset  . Arthritis/Rheumatoid Mother   . Heart block Mother   . Clotting disorder Mother   . Osteoporosis Mother   . Heart attack Mother   . Anxiety disorder Mother   . Depression Mother   . Heart disease Father   . Heart attack Father   . Depression Sister   . Anxiety disorder Sister     Social History: I have reviewed social history from my progress note on 05/09/2017. Social History   Socioeconomic History  . Marital status: Single    Spouse name: Not on file  . Number of children: Not on file  . Years of education: Not on file  . Highest education level: Not on file  Occupational History  . Not on file  Tobacco Use  . Smoking status: Never Smoker  . Smokeless tobacco: Never Used  Vaping Use  . Vaping Use: Never used  Substance and Sexual Activity  . Alcohol use: No    Alcohol/week: 0.0 standard drinks  . Drug use: No  . Sexual activity: Yes    Partners: Male    Birth control/protection: Surgical  Other Topics Concern  . Not on file  Social History Narrative  . Not on file   Social Determinants of Health   Financial Resource Strain:   . Difficulty of Paying Living Expenses: Not on file  Food Insecurity:   . Worried About Charity fundraiser in the Last  Year: Not on file  . Ran Out of Food in the Last Year: Not on file  Transportation Needs:   . Lack of Transportation (Medical): Not on file  . Lack of Transportation (Non-Medical): Not on file  Physical Activity:   . Days of Exercise per Week: Not on file  . Minutes of Exercise per Session: Not on file  Stress:   . Feeling of Stress : Not on file  Social Connections:   . Frequency of Communication with Friends and Family: Not on file  . Frequency of Social Gatherings with Friends and Family: Not on file  . Attends Religious Services: Not on file  . Active Member of Clubs or Organizations: Not on file  . Attends Archivist Meetings: Not on file  .  Marital Status: Not on file    Allergies:  Allergies  Allergen Reactions  . Amoxicillin Anaphylaxis, Hives, Shortness Of Breath, Swelling and Rash    Has patient had a PCN reaction causing immediate rash, facial/tongue/throat swelling, SOB or lightheadedness with hypotension: Yes Has patient had a PCN reaction causing severe rash involving mucus membranes or skin necrosis: Yes Has patient had a PCN reaction that required hospitalization No Has patient had a PCN reaction occurring within the last 10 years: No If all of the above answers are "NO", then may proceed with Cephalosporin use. Other reaction(s): ANAPHYLAXIS Other reaction(s): ANAPHYLAX  . Penicillins Anaphylaxis, Swelling, Rash, Shortness Of Breath and Hives    Has patient had a PCN reaction causing immediate rash, facial/tongue/throat swelling, SOB or lightheadedness with hypotension: Yes Has patient had a PCN reaction causing severe rash involving mucus membranes or skin necrosis: Yes Has patient had a PCN reaction that required hospitalization No Has patient had a PCN reaction occurring within the last 10 years: No If all of the above answers are "NO", then may proceed with Cephalosporin use.  Other reaction(s): ANAPHYLAXIS Other reaction(s): ANAPHYLAXIS Has  patient had a PCN reaction causing immediate rash, facial/tongue/throat swelling, SOB or lightheadedness with hypotension: Yes Has patient had a PCN reaction causing severe rash involving mucus membranes or skin necrosis: Yes Has patient had a PCN reaction that required hospitalization No Has patient had a PCN reaction occurring within the last 10 years: No If all of the above answers are "NO", then may proceed with Cephalosporin use. Other reaction(s): ANAPHYLAXIS Other reaction(s): ANAPHYLAX Has patient had a PCN reaction causing immediate rash, facial/tongue/throat swelling, SOB or lightheadedness with hypotension: Yes Has patient had a PCN reaction causing severe rash involving mucus membranes or skin necrosis: Yes Has patient had a PCN reaction that required hospitalization No Has patient had a PCN reaction occurring within the last 10 years: No If all of the above answers are "NO", then may proceed with Cephalosporin use. Other reaction(s): ANAPHYLAXIS Other reaction(s): ANAPHYLAXIS   . Morphine Other (See Comments)    Muscle spasms  . Sulfa Antibiotics Nausea Only    Other reaction(s): VOMITING Other reaction(s): VOMITING Other reaction(s): VOMITING    Metabolic Disorder Labs: Lab Results  Component Value Date   HGBA1C 5.5 12/11/2019   MPG 111 12/11/2019   Lab Results  Component Value Date   PROLACTIN 12.1 12/11/2019   Lab Results  Component Value Date   CHOL 162 12/11/2019   TRIG 51 12/11/2019   HDL 87 12/11/2019   CHOLHDL 1.9 12/11/2019   VLDL 10 12/11/2019   LDLCALC 65 12/11/2019   Lab Results  Component Value Date   TSH 1.886 12/11/2019   TSH  05/13/2009    3.163 (NOTE)  Please note change in reference ranges for ages 39W to 79Y. Test methodology is 3rd generation TSH    Therapeutic Level Labs: No results found for: LITHIUM No results found for: VALPROATE No components found for:  CBMZ  Current Medications: Current Outpatient Medications  Medication  Sig Dispense Refill  . gabapentin (NEURONTIN) 300 MG capsule Take by mouth.    . traMADol (ULTRAM) 50 MG tablet Take by mouth.    [name] acetaminophen (TYLENOL) 325 MG tablet Take 650 mg by mouth every 4 (four) hours as needed for moderate pain.     . ARIPiprazole (ABILIFY) 2 MG tablet TAKE 1 BY MOUTH DAILY 30 tablet 3  . azithromycin (ZITHROMAX) 250 MG tablet Take 1 tablet (250  mg total) by mouth daily. Take first 2 tablets together, then 1 every day until finished. 6 tablet 0  . buPROPion (WELLBUTRIN XL) 150 MG 24 hr tablet TAKE 2 TABLETS(300 MG) BY MOUTH DAILY 60 tablet 3  . cetirizine-pseudoephedrine (ZYRTEC-D) 5-120 MG tablet Take 1 tablet by mouth daily. 30 tablet 0  . cyanocobalamin (,VITAMIN B-12,) 1000 MCG/ML injection Inject 1,000 mcg into the muscle every 30 (thirty) days.     . diphenoxylate-atropine (LOMOTIL) 2.5-0.025 MG tablet     . doxycycline (MONODOX) 100 MG capsule     . doxycycline (VIBRA-TABS) 100 MG tablet     . doxycycline (VIBRAMYCIN) 100 MG capsule Take 1 capsule (100 mg total) by mouth 2 (two) times daily. 14 capsule 0  . fluticasone (FLONASE) 50 MCG/ACT nasal spray Place 2 sprays into both nostrils daily. 16 g 0  . gabapentin (NEURONTIN) 300 MG capsule Take by mouth.    [name] guaiFENesin (MUCINEX) 600 MG 12 hr tablet Take 2 tablets (1,200 mg total) by mouth 2 (two) times daily as needed. 14 tablet 0  . hydrOXYzine (ATARAX/VISTARIL) 25 MG tablet TAKE 1/2 TO 1 TABLET(12.5 TO 25 MG) BY MOUTH TWICE DAILY AS NEEDED FOR ANXIETY 60 tablet 2  . ibuprofen (ADVIL,MOTRIN) 600 MG tablet Take 1 tablet (600 mg total) by mouth every 6 (six) hours as needed. 20 tablet 0  . lamoTRIgine (LAMICTAL) 150 MG tablet TAKE 1 TABLET(150 MG) BY MOUTH DAILY 30 tablet 2  . loratadine (CLARITIN) 10 MG tablet Take 10 mg by mouth daily.    . metoprolol succinate (TOPROL-XL) 25 MG 24 hr tablet Take by mouth.    . Multiple Vitamins-Minerals (HM MULTIVITAMIN ADULT GUMMY) CHEW Chew 2 tablets by mouth daily.    .  ondansetron (ZOFRAN ODT) 4 MG disintegrating tablet Take 1 tablet (4 mg total) by mouth every 8 (eight) hours as needed. 20 tablet 0  . predniSONE (STERAPRED UNI-PAK 21 TAB) 10 MG (21) TBPK tablet Take by mouth daily. Take 6 tabs by mouth daily  for 1 day, then 5 tabs for 1 day, then 4 tabs for 1 day, then 3 tabs for 1 day, 2 tabs for 1 day, then 1 tab by mouth daily for 1 day 21 tablet 0  . SUMAtriptan (IMITREX) 100 MG tablet Take 100 mg by mouth every 2 (two) hours as needed for migraine or headache.     . thiamine (VITAMIN B-1) 100 MG tablet Take 100 mg by mouth.    . topiramate (TOPAMAX) 50 MG tablet TAKE 1 TABLET BY MOUTH IN THE MORNING, AND 2 TABLETS EVERY NIGHT AT BEDTIME    . Vilazodone HCl (VIIBRYD) 40 MG TABS TAKE 1 TABLET(40 MG) BY MOUTH DAILY 30 tablet 3  . zolpidem (AMBIEN CR) 12.5 MG CR tablet TAKE 1 TABLET(12.5 MG) BY MOUTH AT BEDTIME AS NEEDED FOR SLEEP 30 tablet 2   No current facility-administered medications for this visit.     Musculoskeletal: Strength & Muscle Tone: UTA Gait & Station: normal Patient leans: N/A  Psychiatric Specialty Exam: Review of Systems  Psychiatric/Behavioral: Negative for agitation, behavioral problems, confusion, decreased concentration, dysphoric mood, hallucinations, self-injury, sleep disturbance and suicidal ideas. The patient is not nervous/anxious and is not hyperactive.   All other systems reviewed and are negative.   There were no vitals taken for this visit.There is no height or weight on file to calculate BMI.  General Appearance: Casual  Eye Contact:  Good  Speech:  Clear and Coherent  Volume:  Normal  Mood:  Euthymic  Affect:  Congruent  Thought Process:  Goal Directed and Descriptions of Associations: Intact  Orientation:  Full (Time, Place, and Person)  Thought Content: Logical   Suicidal Thoughts:  No  Homicidal Thoughts:  No  Memory:  Immediate;   Fair Recent;   Fair Remote;   Fair  Judgement:  Fair  Insight:  Fair   Psychomotor Activity:  Normal  Concentration:  Concentration: Fair and Attention Span: Fair  Recall:  [employer] of Knowledge: Fair  Language: Fair  Akathisia:  No  Handed:  Right  AIMS (if indicated): UTA  Assets:  Communication Skills Desire for Improvement Housing Social Support  ADL's:  Intact  Cognition: WNL  Sleep:  Fair   Screenings:   Assessment and Plan: [name] is a 44 year old Caucasian female who has a history of depression, anxiety, insomnia was evaluated by telemedicine today.  She is currently stable.  Plan as noted below.  Plan MDD in remission Wellbutrin XL 300 mg p.o. daily-reduced dosage. Viibryd 40 mg p.o. daily Lamictal as prescribed Abilify 2 mg p.o. daily.  Long-term goal is to taper her off.  PTSD/panic disorder-stable Lamotrigine 150 mg p.o. daily Continue Viibryd 40 mg p.o. daily Hydroxyzine as needed. Continue CBT as needed  Insomnia-stable Ambien CR 12.5 mg p.o. nightly Hydroxyzine 25 mg p.o. nightly as needed   High risk medication use-I have reviewed the following labs TSH, lipid panel, hemoglobin A1c, prolactin-within normal limits.  CBC with differential-hemoglobin-low.  She will talk to her primary care provider.  Follow-up in clinic in 2 months or sooner if needed.  I have spent atleast 20 minutes face to face by video with patient today. More than 50 % of the time was spent for preparing to see the patient ( e.g., review of test, records ), ordering medications and test ,psychoeducation and supportive psychotherapy and care coordination,as well as documenting clinical information in electronic health record. This note was generated in part or whole with voice recognition software. Voice recognition is usually quite accurate but there are transcription errors that can and very often do occur. I apologize for any typographical errors that were not detected and corrected.        Ursula Alert, MD 12/13/2019, 5:21 PM

## 2019-12-12 NOTE — Telephone Encounter (Signed)
Patient called reporting that she recently had labs drawn and that her hgb as dropped. She is asking for an appointment to see if she needs to have iron infusion. Her last visit with our office was 11/03/2016. Please advise  CBC with Differential/Platelet Order: [id number] Status:  Final result   Visible to patient:  Yes (seen) Next appt:  None 0 Result Notes  Ref Range & Units 1 d ago 1 yr ago  WBC 4.0 - 10.5 K/uL 5.5  6.5   RBC 3.87 - 5.11 MIL/uL 4.58  4.52   Hemoglobin 12.0 - 15.0 g/dL 11.7 Low   12.7   HCT 36 - 46 % 37.8  39.9   MCV 80.0 - 100.0 fL 82.5  88.3   MCH 26.0 - 34.0 pg 25.5 Low   28.1   MCHC 30.0 - 36.0 g/dL 31.0  31.8   RDW 11.5 - 15.5 % 14.8  12.8   Platelets 150 - 400 K/uL 381  286   nRBC 0.0 - 0.2 % 0.0  0.0 CM   Neutrophils Relative % % 57    Neutro Abs 1.7 - 7.7 K/uL 3.2    Lymphocytes Relative % 30    Lymphs Abs 0.7 - 4.0 K/uL 1.7    Monocytes Relative % 8    Monocytes Absolute 0.1 - 1.0 K/uL 0.5    Eosinophils Relative % 3    Eosinophils Absolute 0.0 - 0.5 K/uL 0.2    Basophils Relative % 2    Basophils Absolute 0.0 - 0.1 K/uL 0.1    Immature Granulocytes % 0    Abs Immature Granulocytes 0.00 - 0.07 K/uL 0.01    Comment: Performed at [facility], [address] [place], [place] [address]  Resulting Agency  [facility] CL[facility] LAB [facility] LAB      Specimen Collected: 12/11/19 08:54 Last Resulted: 12/11/19 09:08     Lab Flowsheet   Order Details   View Encounter   Lab and Collection Details   Routing   Result History      CM=Additional comments      Result Care Coordination  Patient Communication  Add Comments  Seen Back to Top      Other Results from 12/11/2019  TSH  Status:  Final result   Visible to patient:  Yes (not seen) Next appt:  None Order: [id number] Result Notes  Ref Range & Units 1 d ago 10 yr ago  TSH 0.350 - 4.500 uIU/mL 1.886     Comment: Performed by a 3rd Generation assay with a functional sensitivity of <=0.01 uIU/mL.   Performed at [facility], [address] [place], [place] [address]   Resulting Agency  [facility] CL[facility] LAB [facility] CL[facility] LAB      Specimen Collected: 12/11/19 08:54 Last Resulted: 12/11/19 09:42     Lab Flowsheet   Order Details   View Encounter   Lab and Collection Details   Routing   Result History        Result Care Coordination  Patient Communication  Add Comments  Add Notifications  Back to Top        Prolactin  Status:  Final result   Visible to patient:  Yes (not seen) Next appt:  None Order: [id number] Result Notes  Ref Range & Units 1 d ago  Prolactin 4.8 - 23.3 ng/mL 12.1   Comment: (NOTE)  Performed At: [facility]  [address] [place], [place] [id number]  Rush Farmer MD  EM:[id number]   Resulting Agency  [place] [facility] LAB      Specimen Collected: 12/11/19 08:54 Last Resulted: 12/12/19 06:37     Lab Flowsheet   Order Details   View Encounter   Lab and Collection Details   Routing   Result History        Result Care Coordination  Patient Communication  Add Comments  Add Notifications  Back to Top        Hemoglobin A1c  Status:  Final result   Visible to patient:  Yes (not seen) Next appt:  None Order: [id number] Result Notes   1 HM Topic  Ref Range & Units 1 d ago 5 yr ago  Hgb A1c MFr Bld 4.8 - 5.6 % 5.5  4.8 R, CM   Comment: (NOTE)          Prediabetes: 5.7 - 6.4          Diabetes: >6.4          Glycemic control for adults with diabetes: <7.0   Mean Plasma Glucose mg/dL 111    Comment: (NOTE)  Performed At: [facility]  [address] [place], [place] [id number]  Rush Farmer MD LI:[id number]   Resulting Agency  [facility] CLIN LAB [place] [facility] CONVERSION      Specimen Collected: 12/11/19 08:54 Last Resulted: 12/11/19 23:35

## 2019-12-13 ENCOUNTER — Other Ambulatory Visit: Payer: Self-pay

## 2019-12-13 DIAGNOSIS — D509 Iron deficiency anemia, unspecified: Secondary | ICD-10-CM

## 2019-12-13 NOTE — Telephone Encounter (Signed)
I thinks she is ok, but she can have an appt in the next couple weeks with lab, MD, IV iron.

## 2019-12-13 NOTE — Telephone Encounter (Signed)
Future labs are in.

## 2019-12-20 ENCOUNTER — Encounter: Payer: Self-pay | Admitting: Oncology

## 2019-12-20 ENCOUNTER — Other Ambulatory Visit: Payer: Self-pay | Admitting: Oncology

## 2019-12-20 NOTE — Progress Notes (Signed)
Patient called for pre assessment. She states she is very weak and has been eating a lots of ice. She denies pain or other concerns at this time.

## 2019-12-21 ENCOUNTER — Inpatient Hospital Stay (HOSPITAL_BASED_OUTPATIENT_CLINIC_OR_DEPARTMENT_OTHER): Payer: Medicare Other | Admitting: Oncology

## 2019-12-21 ENCOUNTER — Inpatient Hospital Stay: Payer: Medicare Other

## 2019-12-21 ENCOUNTER — Inpatient Hospital Stay: Payer: Medicare Other | Attending: Oncology

## 2019-12-21 VITALS — BP 105/70 | HR 75

## 2019-12-21 VITALS — BP 112/69 | HR 75 | Temp 98.3°F | Resp 20 | Wt 159.7 lb

## 2019-12-21 DIAGNOSIS — D509 Iron deficiency anemia, unspecified: Secondary | ICD-10-CM | POA: Insufficient documentation

## 2019-12-21 DIAGNOSIS — Z9884 Bariatric surgery status: Secondary | ICD-10-CM | POA: Insufficient documentation

## 2019-12-21 DIAGNOSIS — D508 Other iron deficiency anemias: Secondary | ICD-10-CM

## 2019-12-21 LAB — FERRITIN: Ferritin: 4 ng/mL — ABNORMAL LOW (ref 11–307)

## 2019-12-21 LAB — CBC WITH DIFFERENTIAL/PLATELET
Abs Immature Granulocytes: 0.01 10*3/uL (ref 0.00–0.07)
Basophils Absolute: 0.1 10*3/uL (ref 0.0–0.1)
Basophils Relative: 1 %
Eosinophils Absolute: 0.2 10*3/uL (ref 0.0–0.5)
Eosinophils Relative: 4 %
HCT: 36.8 % (ref 36.0–46.0)
Hemoglobin: 11.6 g/dL — ABNORMAL LOW (ref 12.0–15.0)
Immature Granulocytes: 0 %
Lymphocytes Relative: 25 %
Lymphs Abs: 1.4 10*3/uL (ref 0.7–4.0)
MCH: 25.9 pg — ABNORMAL LOW (ref 26.0–34.0)
MCHC: 31.5 g/dL (ref 30.0–36.0)
MCV: 82.1 fL (ref 80.0–100.0)
Monocytes Absolute: 0.6 10*3/uL (ref 0.1–1.0)
Monocytes Relative: 11 %
Neutro Abs: 3.5 10*3/uL (ref 1.7–7.7)
Neutrophils Relative %: 59 %
Platelets: 394 10*3/uL (ref 150–400)
RBC: 4.48 MIL/uL (ref 3.87–5.11)
RDW: 14.8 % (ref 11.5–15.5)
WBC: 5.8 10*3/uL (ref 4.0–10.5)
nRBC: 0 % (ref 0.0–0.2)

## 2019-12-21 LAB — IRON AND TIBC
Iron: 30 ug/dL (ref 28–170)
Saturation Ratios: 6 % — ABNORMAL LOW (ref 10.4–31.8)
TIBC: 543 ug/dL — ABNORMAL HIGH (ref 250–450)
UIBC: 513 ug/dL

## 2019-12-21 MED ORDER — SODIUM CHLORIDE 0.9 % IV SOLN
Freq: Once | INTRAVENOUS | Status: AC
  Filled 2019-12-21: qty 250

## 2019-12-21 MED ORDER — SODIUM CHLORIDE 0.9 % IV SOLN
510.0000 mg | Freq: Once | INTRAVENOUS | Status: AC
  Administered 2019-12-21: 510 mg via INTRAVENOUS
  Filled 2019-12-21: qty 510

## 2019-12-22 NOTE — Progress Notes (Signed)
[place]  [phone] [phone]) [phone]  ID: [name] OB: 02/07/1976  MR#: [id number]  VVO#:[id number]  Patient Care Team: Leonard Downing, MD as PCP - General (Family Medicine)  CHIEF COMPLAINT: Iron deficiency anemia.   INTERVAL HISTORY: Patient was last seen in clinic in September 2018.  She returns today with complaints of increasing weakness and fatigue as well as pica eating more ice.  She otherwise feels well.  She has no neurologic complaints.  She denies any recent fevers or illnesses.  She has a good appetite and denies weight loss.  She has no chest pain, shortness of breath, cough, or hemoptysis.  She denies any nausea, vomiting, constipation, or diarrhea.  She has no melena or hematochezia.  She has no urinary complaints.  Patient offers no further specific complaints today.  REVIEW OF SYSTEMS:   Review of Systems  Constitutional: Positive for malaise/fatigue. Negative for fever and weight loss.  Respiratory: Negative.  Negative for cough and shortness of breath.   Cardiovascular: Negative.  Negative for chest pain and leg swelling.  Gastrointestinal: Negative.  Negative for abdominal pain, blood in stool and melena.  Genitourinary: Negative.  Negative for hematuria.  Musculoskeletal: Negative.  Negative for back pain, joint pain and neck pain.  Skin: Negative.  Negative for rash.  Neurological: Positive for weakness. Negative for dizziness.  Psychiatric/Behavioral: Negative.  Negative for depression. The patient is not nervous/anxious.     As per HPI. Otherwise, a complete review of systems is negative.  PAST MEDICAL HISTORY: Past Medical History:  Diagnosis Date  . Abnormal ECG   . Anemia   . Anxiety   . Arthritis   . B12 deficiency   . Cardiomyopathy ([place])   . Chronic abdominal pain   . Complication of anesthesia    difficulty to get sedated during endoscopy  . COVID-19 01/18/2019  . DDD (degenerative disc  disease), lumbar   . Depression   . Diabetes mellitus without complication ([place])   . Dysrhythmia   . GERD (gastroesophageal reflux disease)   . Headache   . Iron deficiency   . Neuropathy   . Obesity   . Ovarian cyst   . Pneumonia    within past five years  . PTSD (post-traumatic stress disorder)   . PTSD (post-traumatic stress disorder)   . Rh negative status during pregnancy   . Seizures ([place])   . Small bowel obstruction ([place])     PAST SURGICAL HISTORY: Past Surgical History:  Procedure Laterality Date  . ABDOMINAL HYSTERECTOMY    . BILATERAL SALPINGECTOMY Bilateral 09/29/2015   Procedure: BILATERAL SALPINGECTOMY;  Surgeon: Benjaman Kindler, MD;  Location: [facility];  Service: Gynecology;  Laterality: Bilateral;  . bowel obstruction  2011  . CHOLECYSTECTOMY    . COLONOSCOPY WITH PROPOFOL N/A 01/24/2018   Procedure: COLONOSCOPY WITH PROPOFOL;  Surgeon: Toledo, Benay Pike, MD;  Location: [facility];  Service: Gastroenterology;  Laterality: N/A;  . DILATION AND CURETTAGE OF UTERUS    . ESOPHAGOGASTRODUODENOSCOPY    . ESOPHAGOGASTRODUODENOSCOPY (EGD) WITH PROPOFOL N/A 01/24/2018   Procedure: ESOPHAGOGASTRODUODENOSCOPY (EGD) WITH PROPOFOL;  Surgeon: Toledo, Benay Pike, MD;  Location: [facility];  Service: Gastroenterology;  Laterality: N/A;  . GASTRIC BYPASS    . gi bleed  2009   Surgery-was in ICU for three weeks  . HERNIA REPAIR    . OVARIAN CYST REMOVAL Left 09/29/2015   Procedure: OVARIAN CYSTECTOMY;  Surgeon: Benjaman Kindler, MD;  Location: [facility];  Service: Gynecology;  Laterality: Left;  . ROUX-EN-Y PROCEDURE    . VAGINAL HYSTERECTOMY N/A 09/29/2015   Procedure: HYSTERECTOMY VAGINAL;  Surgeon: Benjaman Kindler, MD;  Location: [facility];  Service: Gynecology;  Laterality: N/A;    FAMILY HISTORY Family History  Problem Relation Age of Onset  . Arthritis/Rheumatoid Mother   . Heart block Mother   . Clotting disorder Mother   . Osteoporosis Mother   . Heart attack  Mother   . Anxiety disorder Mother   . Depression Mother   . Heart disease Father   . Heart attack Father   . Depression Sister   . Anxiety disorder Sister        ADVANCED DIRECTIVES:    HEALTH MAINTENANCE: Social History   Tobacco Use  . Smoking status: Never Smoker  . Smokeless tobacco: Never Used  Vaping Use  . Vaping Use: Never used  Substance Use Topics  . Alcohol use: No    Alcohol/week: 0.0 standard drinks  . Drug use: No     Colonoscopy:  PAP:  Bone density:  Lipid panel:  Allergies  Allergen Reactions  . Amoxicillin Anaphylaxis, Hives, Shortness Of Breath, Swelling and Rash    Has patient had a PCN reaction causing immediate rash, facial/tongue/throat swelling, SOB or lightheadedness with hypotension: Yes Has patient had a PCN reaction causing severe rash involving mucus membranes or skin necrosis: Yes Has patient had a PCN reaction that required hospitalization No Has patient had a PCN reaction occurring within the last 10 years: No If all of the above answers are "NO", then may proceed with Cephalosporin use. Other reaction(s): ANAPHYLAXIS Other reaction(s): ANAPHYLAX  . Penicillins Anaphylaxis, Swelling, Rash, Shortness Of Breath and Hives    Has patient had a PCN reaction causing immediate rash, facial/tongue/throat swelling, SOB or lightheadedness with hypotension: Yes Has patient had a PCN reaction causing severe rash involving mucus membranes or skin necrosis: Yes Has patient had a PCN reaction that required hospitalization No Has patient had a PCN reaction occurring within the last 10 years: No If all of the above answers are "NO", then may proceed with Cephalosporin use.  Other reaction(s): ANAPHYLAXIS Other reaction(s): ANAPHYLAXIS Has patient had a PCN reaction causing immediate rash, facial/tongue/throat swelling, SOB or lightheadedness with hypotension: Yes Has patient had a PCN reaction causing severe rash involving mucus membranes or skin  necrosis: Yes Has patient had a PCN reaction that required hospitalization No Has patient had a PCN reaction occurring within the last 10 years: No If all of the above answers are "NO", then may proceed with Cephalosporin use. Other reaction(s): ANAPHYLAXIS Other reaction(s): ANAPHYLAX Has patient had a PCN reaction causing immediate rash, facial/tongue/throat swelling, SOB or lightheadedness with hypotension: Yes Has patient had a PCN reaction causing severe rash involving mucus membranes or skin necrosis: Yes Has patient had a PCN reaction that required hospitalization No Has patient had a PCN reaction occurring within the last 10 years: No If all of the above answers are "NO", then may proceed with Cephalosporin use. Other reaction(s): ANAPHYLAXIS Other reaction(s): ANAPHYLAXIS   . Morphine Other (See Comments)    Muscle spasms  . Sulfa Antibiotics Nausea Only    Other reaction(s): VOMITING Other reaction(s): VOMITING Other reaction(s): VOMITING    Current Outpatient Medications  Medication Sig Dispense Refill  . acetaminophen (TYLENOL) 325 MG tablet Take 650 mg by mouth every 4 (four) hours as needed for moderate pain.     . ARIPiprazole (ABILIFY) 2 MG tablet TAKE 1 BY MOUTH DAILY  30 tablet 3  . azithromycin (ZITHROMAX) 250 MG tablet Take 1 tablet (250 mg total) by mouth daily. Take first 2 tablets together, then 1 every day until finished. 6 tablet 0  . buPROPion (WELLBUTRIN XL) 150 MG 24 hr tablet TAKE 2 TABLETS(300 MG) BY MOUTH DAILY 60 tablet 3  . cetirizine-pseudoephedrine (ZYRTEC-D) 5-120 MG tablet Take 1 tablet by mouth daily. 30 tablet 0  . cyanocobalamin (,VITAMIN B-12,) 1000 MCG/ML injection Inject 1,000 mcg into the muscle every 30 (thirty) days.     . diphenoxylate-atropine (LOMOTIL) 2.5-0.025 MG tablet     . doxycycline (MONODOX) 100 MG capsule     . doxycycline (VIBRA-TABS) 100 MG tablet     . doxycycline (VIBRAMYCIN) 100 MG capsule Take 1 capsule (100 mg total) by  mouth 2 (two) times daily. 14 capsule 0  . fluticasone (FLONASE) 50 MCG/ACT nasal spray Place 2 sprays into both nostrils daily. 16 g 0  . gabapentin (NEURONTIN) 300 MG capsule Take by mouth.    . gabapentin (NEURONTIN) 300 MG capsule Take by mouth.    [name] guaiFENesin (MUCINEX) 600 MG 12 hr tablet Take 2 tablets (1,200 mg total) by mouth 2 (two) times daily as needed. 14 tablet 0  . hydrOXYzine (ATARAX/VISTARIL) 25 MG tablet TAKE 1/2 TO 1 TABLET(12.5 TO 25 MG) BY MOUTH TWICE DAILY AS NEEDED FOR ANXIETY 60 tablet 2  . ibuprofen (ADVIL,MOTRIN) 600 MG tablet Take 1 tablet (600 mg total) by mouth every 6 (six) hours as needed. 20 tablet 0  . lamoTRIgine (LAMICTAL) 150 MG tablet TAKE 1 TABLET(150 MG) BY MOUTH DAILY 30 tablet 2  . loratadine (CLARITIN) 10 MG tablet Take 10 mg by mouth daily.    . Multiple Vitamins-Minerals (HM MULTIVITAMIN ADULT GUMMY) CHEW Chew 2 tablets by mouth daily.    . ondansetron (ZOFRAN ODT) 4 MG disintegrating tablet Take 1 tablet (4 mg total) by mouth every 8 (eight) hours as needed. 20 tablet 0  . predniSONE (STERAPRED UNI-PAK 21 TAB) 10 MG (21) TBPK tablet Take by mouth daily. Take 6 tabs by mouth daily  for 1 day, then 5 tabs for 1 day, then 4 tabs for 1 day, then 3 tabs for 1 day, 2 tabs for 1 day, then 1 tab by mouth daily for 1 day 21 tablet 0  . SUMAtriptan (IMITREX) 100 MG tablet Take 100 mg by mouth every 2 (two) hours as needed for migraine or headache.     . thiamine (VITAMIN B-1) 100 MG tablet Take 100 mg by mouth.    . topiramate (TOPAMAX) 50 MG tablet TAKE 1 TABLET BY MOUTH IN THE MORNING, AND 2 TABLETS EVERY NIGHT AT BEDTIME    . traMADol (ULTRAM) 50 MG tablet Take by mouth.    . Vilazodone HCl (VIIBRYD) 40 MG TABS TAKE 1 TABLET(40 MG) BY MOUTH DAILY 30 tablet 3  . zolpidem (AMBIEN CR) 12.5 MG CR tablet TAKE 1 TABLET(12.5 MG) BY MOUTH AT BEDTIME AS NEEDED FOR SLEEP 30 tablet 2  . metoprolol succinate (TOPROL-XL) 25 MG 24 hr tablet Take by mouth.     No current  facility-administered medications for this visit.    OBJECTIVE: Vitals:   12/21/19 1005  BP: 112/69  Pulse: 75  Resp: 20  Temp: 98.3 F (36.8 C)     Body mass index is 25.01 kg/m.    ECOG FS:0 - Asymptomatic  General: Well-developed, well-nourished, no acute distress. Eyes: Pink conjunctiva, anicteric sclera. HEENT: Normocephalic, moist mucous membranes. Lungs: No  audible wheezing or coughing. Heart: Regular rate and rhythm. Abdomen: Soft, nontender, no obvious distention. Musculoskeletal: No edema, cyanosis, or clubbing. Neuro: Alert, answering all questions appropriately. Cranial nerves grossly intact. Skin: No rashes or petechiae noted. Psych: Normal affect.   LAB RESULTS:  Lab Results  Component Value Date   NA 136 04/26/2018   K 3.5 04/26/2018   CL 108 04/26/2018   CO2 20 (L) 04/26/2018   GLUCOSE 108 (H) 04/26/2018   BUN 9 04/26/2018   CREATININE 0.57 04/26/2018   CALCIUM 8.7 (L) 04/26/2018   PROT 7.4 04/26/2018   ALBUMIN 4.1 04/26/2018   AST 17 04/26/2018   ALT 18 04/26/2018   ALKPHOS 32 (L) 04/26/2018   BILITOT 0.9 04/26/2018   GFRNONAA >60 04/26/2018   GFRAA >60 04/26/2018    Lab Results  Component Value Date   WBC 5.8 12/21/2019   NEUTROABS 3.5 12/21/2019   HGB 11.6 (L) 12/21/2019   HCT 36.8 12/21/2019   MCV 82.1 12/21/2019   PLT 394 12/21/2019   Lab Results  Component Value Date   FERRITIN 4 (L) 12/21/2019   Lab Results  Component Value Date   IRON 30 12/21/2019   TIBC 543 (H) 12/21/2019   IRONPCTSAT 6 (L) 12/21/2019      STUDIES: No results found.  ASSESSMENT: Iron deficiency anemia.  PLAN:    1. 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  Patient's hemoglobin is only mildly decreased at 11.6, but her iron stores are significantly reduced and she is symptomatic.  Proceed with 510 mg IV Feraheme today.  Return to clinic in 1 week for second infusion.  Patient will then return to clinic in 4  months with repeat laboratory work, further evaluation, and continuation of treatment if needed.  2. Rheumatoid arthritis: Continued workup to rheumatology. 3. Depression/anxiety:  Continue current medications as prescribed.  I spent a total of 30 minutes reviewing chart data, face-to-face evaluation with the patient, counseling and coordination of care as detailed above.   Patient expressed understanding and was in agreement with this plan. She also understands that She can call clinic at any time with any questions, concerns, or complaints.     Lloyd Huger, MD   12/22/2019 10:25 AM

## 2019-12-27 ENCOUNTER — Other Ambulatory Visit: Payer: Self-pay | Admitting: Psychiatry

## 2019-12-27 DIAGNOSIS — F410 Panic disorder [episodic paroxysmal anxiety] without agoraphobia: Secondary | ICD-10-CM

## 2019-12-28 ENCOUNTER — Inpatient Hospital Stay: Payer: Medicare Other

## 2019-12-28 ENCOUNTER — Other Ambulatory Visit: Payer: Self-pay

## 2019-12-28 VITALS — BP 103/69 | HR 76 | Temp 97.1°F | Resp 18

## 2019-12-28 DIAGNOSIS — D508 Other iron deficiency anemias: Secondary | ICD-10-CM

## 2019-12-28 DIAGNOSIS — D509 Iron deficiency anemia, unspecified: Secondary | ICD-10-CM | POA: Diagnosis not present

## 2019-12-28 MED ORDER — SODIUM CHLORIDE 0.9 % IV SOLN
510.0000 mg | Freq: Once | INTRAVENOUS | Status: AC
  Administered 2019-12-28: 510 mg via INTRAVENOUS
  Filled 2019-12-28: qty 510

## 2019-12-28 MED ORDER — SODIUM CHLORIDE 0.9 % IV SOLN
Freq: Once | INTRAVENOUS | Status: AC
  Filled 2019-12-28: qty 250

## 2019-12-28 NOTE — Progress Notes (Signed)
1245- Patient tolerated Feraheme infusion well. Patient and vital signs stable. Patient discharged to home at this time.

## 2020-01-09 ENCOUNTER — Telehealth: Payer: Medicare Other | Admitting: Psychiatry

## 2020-01-28 ENCOUNTER — Telehealth (INDEPENDENT_AMBULATORY_CARE_PROVIDER_SITE_OTHER): Payer: Medicare Other | Admitting: Psychiatry

## 2020-01-28 ENCOUNTER — Encounter: Payer: Self-pay | Admitting: Psychiatry

## 2020-01-28 ENCOUNTER — Other Ambulatory Visit: Payer: Self-pay

## 2020-01-28 DIAGNOSIS — F3342 Major depressive disorder, recurrent, in full remission: Secondary | ICD-10-CM

## 2020-01-28 DIAGNOSIS — F4310 Post-traumatic stress disorder, unspecified: Secondary | ICD-10-CM | POA: Diagnosis not present

## 2020-01-28 DIAGNOSIS — G4701 Insomnia due to medical condition: Secondary | ICD-10-CM

## 2020-01-28 DIAGNOSIS — F410 Panic disorder [episodic paroxysmal anxiety] without agoraphobia: Secondary | ICD-10-CM

## 2020-01-28 MED ORDER — ARIPIPRAZOLE 2 MG PO TABS
1.0000 mg | ORAL_TABLET | Freq: Every day | ORAL | 0 refills | Status: DC
Start: 2020-01-28 — End: 2020-02-25

## 2020-01-28 MED ORDER — ZOLPIDEM TARTRATE ER 12.5 MG PO TBCR
EXTENDED_RELEASE_TABLET | ORAL | 2 refills | Status: DC
Start: 2020-02-21 — End: 2020-04-03

## 2020-01-28 NOTE — Progress Notes (Signed)
Virtual Visit via Video Note  I connected with [name] on 01/28/20 at  8:30 AM EST by a video enabled telemedicine application and verified that I am speaking with the correct person using two identifiers.  Location Provider Location : [facility] Patient Location : Car  Participants: Patient , Provider   I discussed the limitations of evaluation and management by telemedicine and the availability of in person appointments. The patient expressed understanding and agreed to proceed.    I discussed the assessment and treatment plan with the patient. The patient was provided an opportunity to ask questions and all were answered. The patient agreed with the plan and demonstrated an understanding of the instructions.   The patient was advised to call back or seek an in-person evaluation if the symptoms worsen or if the condition fails to improve as anticipated.   [facility] MD OP Progress Note  01/28/2020 10:49 AM [name]  MRN:  [id number]  Chief Complaint:  Chief Complaint    Follow-up     HPI: [name] is a 44 year old Caucasian female, divorced, lives in [place], has a history of MDD, panic attacks, insomnia, PTSD was evaluated by telemedicine today.  Patient today reports she is currently doing well.  She denies any significant depression, mood lability, anxiety.  She reports sleep as good on the Ambien.  Patient reports she is compliant on all medications. Denies side effects.  Discussed tapering off of the Abilify since she is stable. She is agreeable.  Patient denies any other concerns today.    Visit Diagnosis:    ICD-10-CM   1. MDD (major depressive disorder), recurrent, in full remission ([place])  F33.42 ARIPiprazole (ABILIFY) 2 MG tablet    zolpidem (AMBIEN CR) 12.5 MG CR tablet  2. Panic disorder  F41.0 zolpidem (AMBIEN CR) 12.5 MG CR tablet  3. PTSD (post-traumatic stress disorder)  F43.10 zolpidem (AMBIEN CR) 12.5 MG CR tablet  4. Insomnia  due to medical condition  G47.01     Past Psychiatric History: I have reviewed past psychiatric history from my progress note from 05/09/2017  Past Medical History:  Past Medical History:  Diagnosis Date  . Abnormal ECG   . Anemia   . Anxiety   . Arthritis   . B12 deficiency   . Cardiomyopathy ([place])   . Chronic abdominal pain   . Complication of anesthesia    difficulty to get sedated during endoscopy  . COVID-19 01/18/2019  . DDD (degenerative disc disease), lumbar   . Depression   . Diabetes mellitus without complication ([place])   . Dysrhythmia   . GERD (gastroesophageal reflux disease)   . Headache   . Iron deficiency   . Neuropathy   . Obesity   . Ovarian cyst   . Pneumonia    within past five years  . PTSD (post-traumatic stress disorder)   . PTSD (post-traumatic stress disorder)   . Rh negative status during pregnancy   . Seizures ([place])   . Small bowel obstruction [facility])     Past Surgical History:  Procedure Laterality Date  . ABDOMINAL HYSTERECTOMY    . BILATERAL SALPINGECTOMY Bilateral 09/29/2015   Procedure: BILATERAL SALPINGECTOMY;  Surgeon: Benjaman Kindler, MD;  Location: [facility] ORS;  Service: Gynecology;  Laterality: Bilateral;  . bowel obstruction  2011  . CHOLECYSTECTOMY    . COLONOSCOPY WITH PROPOFOL N/A 01/24/2018   Procedure: COLONOSCOPY WITH PROPOFOL;  Surgeon: Toledo, Benay Pike, MD;  Location: [facility] ENDOSCOPY;  Service: Gastroenterology;  Laterality:  N/A;  . DILATION AND CURETTAGE OF UTERUS    . ESOPHAGOGASTRODUODENOSCOPY    . ESOPHAGOGASTRODUODENOSCOPY (EGD) WITH PROPOFOL N/A 01/24/2018   Procedure: ESOPHAGOGASTRODUODENOSCOPY (EGD) WITH PROPOFOL;  Surgeon: Toledo, Benay Pike, MD;  Location: [facility] ENDOSCOPY;  Service: Gastroenterology;  Laterality: N/A;  . GASTRIC BYPASS    . gi bleed  2009   Surgery-was in ICU for three weeks  . HERNIA REPAIR    . OVARIAN CYST REMOVAL Left 09/29/2015   Procedure: OVARIAN CYSTECTOMY;  Surgeon: Benjaman Kindler, MD;   Location: [facility] ORS;  Service: Gynecology;  Laterality: Left;  . ROUX-EN-Y PROCEDURE    . VAGINAL HYSTERECTOMY N/A 09/29/2015   Procedure: HYSTERECTOMY VAGINAL;  Surgeon: Benjaman Kindler, MD;  Location: [facility] ORS;  Service: Gynecology;  Laterality: N/A;    Family Psychiatric History: I have reviewed family psychiatric history from my progress note on 05/09/2017  Family History:  Family History  Problem Relation Age of Onset  . Arthritis/Rheumatoid Mother   . Heart block Mother   . Clotting disorder Mother   . Osteoporosis Mother   . Heart attack Mother   . Anxiety disorder Mother   . Depression Mother   . Heart disease Father   . Heart attack Father   . Depression Sister   . Anxiety disorder Sister     Social History: I have reviewed social history from my progress note on 05/09/2017 Social History   Socioeconomic History  . Marital status: Single    Spouse name: Not on file  . Number of children: Not on file  . Years of education: Not on file  . Highest education level: Not on file  Occupational History  . Not on file  Tobacco Use  . Smoking status: Never Smoker  . Smokeless tobacco: Never Used  Vaping Use  . Vaping Use: Never used  Substance and Sexual Activity  . Alcohol use: No    Alcohol/week: 0.0 standard drinks  . Drug use: No  . Sexual activity: Yes    Partners: Male    Birth control/protection: Surgical  Other Topics Concern  . Not on file  Social History Narrative  . Not on file   Social Determinants of Health   Financial Resource Strain: Not on file  Food Insecurity: Not on file  Transportation Needs: Not on file  Physical Activity: Not on file  Stress: Not on file  Social Connections: Not on file    Allergies:  Allergies  Allergen Reactions  . Amoxicillin Anaphylaxis, Hives, Shortness Of Breath, Swelling and Rash    Has patient had a PCN reaction causing immediate rash, facial/tongue/throat swelling, SOB or lightheadedness with hypotension:  Yes Has patient had a PCN reaction causing severe rash involving mucus membranes or skin necrosis: Yes Has patient had a PCN reaction that required hospitalization No Has patient had a PCN reaction occurring within the last 10 years: No If all of the above answers are "NO", then may proceed with Cephalosporin use. Other reaction(s): ANAPHYLAXIS Other reaction(s): ANAPHYLAX  . Penicillins Anaphylaxis, Swelling, Rash, Shortness Of Breath and Hives    Has patient had a PCN reaction causing immediate rash, facial/tongue/throat swelling, SOB or lightheadedness with hypotension: Yes Has patient had a PCN reaction causing severe rash involving mucus membranes or skin necrosis: Yes Has patient had a PCN reaction that required hospitalization No Has patient had a PCN reaction occurring within the last 10 years: No If all of the above answers are "NO", then may proceed with Cephalosporin use.  Other  reaction(s): ANAPHYLAXIS Other reaction(s): ANAPHYLAXIS Has patient had a PCN reaction causing immediate rash, facial/tongue/throat swelling, SOB or lightheadedness with hypotension: Yes Has patient had a PCN reaction causing severe rash involving mucus membranes or skin necrosis: Yes Has patient had a PCN reaction that required hospitalization No Has patient had a PCN reaction occurring within the last 10 years: No If all of the above answers are "NO", then may proceed with Cephalosporin use. Other reaction(s): ANAPHYLAXIS Other reaction(s): ANAPHYLAX Has patient had a PCN reaction causing immediate rash, facial/tongue/throat swelling, SOB or lightheadedness with hypotension: Yes Has patient had a PCN reaction causing severe rash involving mucus membranes or skin necrosis: Yes Has patient had a PCN reaction that required hospitalization No Has patient had a PCN reaction occurring within the last 10 years: No If all of the above answers are "NO", then may proceed with Cephalosporin use. Other  reaction(s): ANAPHYLAXIS Other reaction(s): ANAPHYLAXIS   . Morphine Other (See Comments)    Muscle spasms  . Sulfa Antibiotics Nausea Only    Other reaction(s): VOMITING Other reaction(s): VOMITING Other reaction(s): VOMITING    Metabolic Disorder Labs: Lab Results  Component Value Date   HGBA1C 5.5 12/11/2019   MPG 111 12/11/2019   Lab Results  Component Value Date   PROLACTIN 12.1 12/11/2019   Lab Results  Component Value Date   CHOL 162 12/11/2019   TRIG 51 12/11/2019   HDL 87 12/11/2019   CHOLHDL 1.9 12/11/2019   VLDL 10 12/11/2019   LDLCALC 65 12/11/2019   Lab Results  Component Value Date   TSH 1.886 12/11/2019   TSH  05/13/2009    3.163 (NOTE)  Please note change in reference ranges for ages 47W to 33Y. Test methodology is 3rd generation TSH    Therapeutic Level Labs: No results found for: LITHIUM No results found for: VALPROATE No components found for:  CBMZ  Current Medications: Current Outpatient Medications  Medication Sig Dispense Refill  . acetaminophen (TYLENOL) 325 MG tablet Take 650 mg by mouth every 4 (four) hours as needed for moderate pain.     . ARIPiprazole (ABILIFY) 2 MG tablet Take 0.5 tablets (1 mg total) by mouth daily. Start taking half tablet daily for 4 weeks and half tablet every other day for a week and stop taking it 15 tablet 0  . azithromycin (ZITHROMAX) 250 MG tablet Take 1 tablet (250 mg total) by mouth daily. Take first 2 tablets together, then 1 every day until finished. 6 tablet 0  . buPROPion (WELLBUTRIN XL) 150 MG 24 hr tablet TAKE 2 TABLETS(300 MG) BY MOUTH DAILY 60 tablet 3  . cetirizine-pseudoephedrine (ZYRTEC-D) 5-120 MG tablet Take 1 tablet by mouth daily. 30 tablet 0  . cyanocobalamin (,VITAMIN B-12,) 1000 MCG/ML injection Inject 1,000 mcg into the muscle every 30 (thirty) days.     . diphenoxylate-atropine (LOMOTIL) 2.5-0.025 MG tablet     . doxycycline (MONODOX) 100 MG capsule     . doxycycline (VIBRA-TABS) 100 MG  tablet     . doxycycline (VIBRAMYCIN) 100 MG capsule Take 1 capsule (100 mg total) by mouth 2 (two) times daily. 14 capsule 0  . fluticasone (FLONASE) 50 MCG/ACT nasal spray Place 2 sprays into both nostrils daily. 16 g 0  . gabapentin (NEURONTIN) 300 MG capsule Take by mouth.    . gabapentin (NEURONTIN) 300 MG capsule Take by mouth.    [name] guaiFENesin (MUCINEX) 600 MG 12 hr tablet Take 2 tablets (1,200 mg total) by mouth 2 (two)  times daily as needed. 14 tablet 0  . hydrOXYzine (ATARAX/VISTARIL) 25 MG tablet TAKE 1/2 TO 1 TABLET(12.5 TO 25 MG) BY MOUTH TWICE DAILY AS NEEDED FOR ANXIETY 60 tablet 2  . ibuprofen (ADVIL,MOTRIN) 600 MG tablet Take 1 tablet (600 mg total) by mouth every 6 (six) hours as needed. 20 tablet 0  . lamoTRIgine (LAMICTAL) 150 MG tablet TAKE 1 TABLET(150 MG) BY MOUTH DAILY 30 tablet 2  . loratadine (CLARITIN) 10 MG tablet Take 10 mg by mouth daily.    . metoprolol succinate (TOPROL-XL) 25 MG 24 hr tablet Take by mouth.    . Multiple Vitamins-Minerals (HM MULTIVITAMIN ADULT GUMMY) CHEW Chew 2 tablets by mouth daily.    . ondansetron (ZOFRAN ODT) 4 MG disintegrating tablet Take 1 tablet (4 mg total) by mouth every 8 (eight) hours as needed. 20 tablet 0  . predniSONE (STERAPRED UNI-PAK 21 TAB) 10 MG (21) TBPK tablet Take by mouth daily. Take 6 tabs by mouth daily  for 1 day, then 5 tabs for 1 day, then 4 tabs for 1 day, then 3 tabs for 1 day, 2 tabs for 1 day, then 1 tab by mouth daily for 1 day 21 tablet 0  . SUMAtriptan (IMITREX) 100 MG tablet Take 100 mg by mouth every 2 (two) hours as needed for migraine or headache.     . thiamine (VITAMIN B-1) 100 MG tablet Take 100 mg by mouth.    . topiramate (TOPAMAX) 50 MG tablet TAKE 1 TABLET BY MOUTH IN THE MORNING, AND 2 TABLETS EVERY NIGHT AT BEDTIME    . traMADol (ULTRAM) 50 MG tablet Take by mouth.    . Vilazodone HCl (VIIBRYD) 40 MG TABS TAKE 1 TABLET(40 MG) BY MOUTH DAILY 30 tablet 3  . [START ON 02/21/2020] zolpidem (AMBIEN CR)  12.5 MG CR tablet TAKE 1 TABLET(12.5 MG) BY MOUTH AT BEDTIME AS NEEDED FOR SLEEP 30 tablet 2   No current facility-administered medications for this visit.     Musculoskeletal: Strength & Muscle Tone: UTA Gait & Station: normal Patient leans: N/A  Psychiatric Specialty Exam: Review of Systems  Psychiatric/Behavioral: Negative for agitation, behavioral problems, confusion, decreased concentration, dysphoric mood, hallucinations, self-injury, sleep disturbance and suicidal ideas. The patient is not nervous/anxious and is not hyperactive.   All other systems reviewed and are negative.   There were no vitals taken for this visit.There is no height or weight on file to calculate BMI.  General Appearance: Casual  Eye Contact:  Fair  Speech:  Clear and Coherent  Volume:  Normal  Mood:  Euthymic  Affect:  Congruent  Thought Process:  Goal Directed and Descriptions of Associations: Intact  Orientation:  Full (Time, Place, and Person)  Thought Content: Logical   Suicidal Thoughts:  No  Homicidal Thoughts:  No  Memory:  Immediate;   Fair Recent;   Fair Remote;   Fair  Judgement:  Fair  Insight:  Fair  Psychomotor Activity:  Normal  Concentration:  Concentration: Fair and Attention Span: Fair  Recall:  [employer] of Knowledge: Fair  Language: Fair  Akathisia:  No  Handed:  Right  AIMS (if indicated):UTA  Assets:  Communication Skills Desire for Improvement Housing Social Support  ADL's:  Intact  Cognition: WNL  Sleep:  Fair   Screenings:   Assessment and Plan: [name] is a 44 year old Caucasian female who has a history of depression, anxiety, insomnia was evaluated by telemedicine today. Patient is currently stable and is agreeable  to coming off of Abilify. Discussed plan as noted below.  Plan MDD in remission Wellbutrin XL 300 mg p.o. daily-reduced dosage. Viibryd 40 mg p.o. daily Lamictal as prescribed Taper of Abilify-we will reduce Abilify to 1 mg p.o.  daily for the next 4 weeks and then every other day for the next 1 week and stop taking it.  PTSD/panic disorder-stable Lamictal 150 mg p.o. daily Viibryd 40 mg p.o. daily Hydroxyzine as needed Continue CBT as needed  Insomnia-stable Ambien CR 12.5 mg p.o. nightly Hydroxyzine 25 mg p.o. nightly as needed  Follow-up in clinic in 1 month or sooner if needed.  I have spent atleast 20 minutes face to face by video with patient today. More than 50 % of the time was spent for preparing to see the patient ( e.g., review of test, records ),  ordering medications and test ,psychoeducation and supportive psychotherapy and care coordination,as well as documenting clinical information in electronic health record. This note was generated in part or whole with voice recognition software. Voice recognition is usually quite accurate but there are transcription errors that can and very often do occur. I apologize for any typographical errors that were not detected and corrected.      Ursula Alert, MD 01/28/2020, 10:49 AM

## 2020-02-25 ENCOUNTER — Telehealth (INDEPENDENT_AMBULATORY_CARE_PROVIDER_SITE_OTHER): Payer: Medicare Other | Admitting: Psychiatry

## 2020-02-25 ENCOUNTER — Other Ambulatory Visit: Payer: Self-pay

## 2020-02-25 ENCOUNTER — Encounter: Payer: Self-pay | Admitting: Psychiatry

## 2020-02-25 DIAGNOSIS — F410 Panic disorder [episodic paroxysmal anxiety] without agoraphobia: Secondary | ICD-10-CM

## 2020-02-25 DIAGNOSIS — F330 Major depressive disorder, recurrent, mild: Secondary | ICD-10-CM | POA: Diagnosis not present

## 2020-02-25 DIAGNOSIS — G4701 Insomnia due to medical condition: Secondary | ICD-10-CM

## 2020-02-25 DIAGNOSIS — F4310 Post-traumatic stress disorder, unspecified: Secondary | ICD-10-CM | POA: Diagnosis not present

## 2020-02-25 MED ORDER — BUPROPION HCL ER (XL) 150 MG PO TB24
ORAL_TABLET | ORAL | 3 refills | Status: DC
Start: 2020-02-25 — End: 2020-06-09

## 2020-02-25 MED ORDER — LAMOTRIGINE 150 MG PO TABS
ORAL_TABLET | ORAL | 2 refills | Status: DC
Start: 2020-02-25 — End: 2020-04-03

## 2020-02-25 MED ORDER — LAMOTRIGINE 25 MG PO TABS
25.0000 mg | ORAL_TABLET | Freq: Every day | ORAL | 1 refills | Status: DC
Start: 2020-02-25 — End: 2020-04-03

## 2020-02-25 MED ORDER — VIIBRYD 40 MG PO TABS
ORAL_TABLET | ORAL | 2 refills | Status: DC
Start: 2020-02-25 — End: 2020-06-09

## 2020-02-25 NOTE — Progress Notes (Signed)
Virtual Visit via Video Note  I connected with [name] on 02/25/20 at  9:00 AM EST by a video enabled telemedicine application and verified that I am speaking with the correct person using two identifiers.  Location Provider Location : [facility] Patient Location : Home  Participants: Patient , Provider   I discussed the limitations of evaluation and management by telemedicine and the availability of in person appointments. The patient expressed understanding and agreed to proceed.   I discussed the assessment and treatment plan with the patient. The patient was provided an opportunity to ask questions and all were answered. The patient agreed with the plan and demonstrated an understanding of the instructions.   The patient was advised to call back or seek an in-person evaluation if the symptoms worsen or if the condition fails to improve as anticipated.  [place] MD OP Progress Note  02/25/2020 9:22 AM [name]  MRN:  [id number]  Chief Complaint:  Chief Complaint    Follow-up     HPI: [name] is a 45 year old Caucasian female, divorced, lives in [place], has a history of MDD, panic attacks, insomnia, PTSD was evaluated by telemedicine today.  Patient reports she is currently struggling with racing thoughts, sadness, rumination since being off of the Abilify.  It may have been 1 to 2 weeks since she stopped the Abilify.  She does not believe her racing thoughts as getting better as she continues to stay away from Abilify.  She continues to be compliant on her medications otherwise.  She denies side effects.  Patient reports appetite as fair.  She reports sleep as good.  Patient denies any suicidality, homicidality or perceptual disturbances.  She has been noncompliant with her psychotherapy sessions.  Patient reports her previous therapist not in network anymore with her health insurance plan.  She is not interested in establishing care with a new  therapist at this time.  Patient denies any other concerns today.  Visit Diagnosis:    ICD-10-CM   1. MDD (major depressive disorder), recurrent episode, mild (HCC)  F33.0 lamoTRIgine (LAMICTAL) 150 MG tablet    lamoTRIgine (LAMICTAL) 25 MG tablet  2. Panic disorder  F41.0 lamoTRIgine (LAMICTAL) 150 MG tablet    Vilazodone HCl (VIIBRYD) 40 MG TABS  3. PTSD (post-traumatic stress disorder)  F43.10 lamoTRIgine (LAMICTAL) 150 MG tablet    buPROPion (WELLBUTRIN XL) 150 MG 24 hr tablet    Vilazodone HCl (VIIBRYD) 40 MG TABS  4. Insomnia due to medical condition  G47.01     Past Psychiatric History: I have reviewed past psychiatric history primary progress note from 05/09/2017  Past Medical History:  Past Medical History:  Diagnosis Date  . Abnormal ECG   . Anemia   . Anxiety   . Arthritis   . B12 deficiency   . Cardiomyopathy ([place])   . Chronic abdominal pain   . Complication of anesthesia    difficulty to get sedated during endoscopy  . COVID-19 01/18/2019  . DDD (degenerative disc disease), lumbar   . Depression   . Diabetes mellitus without complication ([place])   . Dysrhythmia   . GERD (gastroesophageal reflux disease)   . Headache   . Iron deficiency   . Neuropathy   . Obesity   . Ovarian cyst   . Pneumonia    within past five years  . PTSD (post-traumatic stress disorder)   . PTSD (post-traumatic stress disorder)   . Rh negative status during pregnancy   . Seizures (  [place])   . Small bowel obstruction ([place])     Past Surgical History:  Procedure Laterality Date  . ABDOMINAL HYSTERECTOMY    . BILATERAL SALPINGECTOMY Bilateral 09/29/2015   Procedure: BILATERAL SALPINGECTOMY;  Surgeon: Benjaman Kindler, MD;  Location: [facility] ORS;  Service: Gynecology;  Laterality: Bilateral;  . bowel obstruction  2011  . CHOLECYSTECTOMY    . COLONOSCOPY WITH PROPOFOL N/A 01/24/2018   Procedure: COLONOSCOPY WITH PROPOFOL;  Surgeon: Toledo, Benay Pike, MD;  Location: [facility] END[facility]COPY;  Service:  Gastroenterology;  Laterality: N/A;  . DILATION AND CURETTAGE OF UTERUS    . ESOPHAGOGASTRODUODENOSCOPY    . ESOPHAGOGASTRODUODENOSCOPY (EGD) WITH PROPOFOL N/A 01/24/2018   Procedure: ESOPHAGOGASTRODUODENOSCOPY (EGD) WITH PROPOFOL;  Surgeon: Toledo, Benay Pike, MD;  Location: [facility] END[facility]COPY;  Service: Gastroenterology;  Laterality: N/A;  . GASTRIC BYPASS    . gi bleed  2009   Surgery-was in ICU for three weeks  . HERNIA REPAIR    . OVARIAN CYST REMOVAL Left 09/29/2015   Procedure: OVARIAN CYSTECTOMY;  Surgeon: Benjaman Kindler, MD;  Location: [facility] ORS;  Service: Gynecology;  Laterality: Left;  . ROUX-EN-Y PROCEDURE    . VAGINAL HYSTERECTOMY N/A 09/29/2015   Procedure: HYSTERECTOMY VAGINAL;  Surgeon: Benjaman Kindler, MD;  Location: [facility] ORS;  Service: Gynecology;  Laterality: N/A;    Family Psychiatric History: I have reviewed family psychiatric history from my progress note on 05/09/2017  Family History:  Family History  Problem Relation Age of Onset  . Arthritis/Rheumatoid Mother   . Heart block Mother   . Clotting disorder Mother   . Osteoporosis Mother   . Heart attack Mother   . Anxiety disorder Mother   . Depression Mother   . Heart disease Father   . Heart attack Father   . Depression Sister   . Anxiety disorder Sister     Social History: Review of social history from my progress note on 05/09/2017 Social History   Socioeconomic History  . Marital status: Single    Spouse name: Not on file  . Number of children: Not on file  . Years of education: Not on file  . Highest education level: Not on file  Occupational History  . Not on file  Tobacco Use  . Smoking status: Never Smoker  . Smokeless tobacco: Never Used  Vaping Use  . Vaping Use: Never used  Substance and Sexual Activity  . Alcohol use: No    Alcohol/week: 0.0 standard drinks  . Drug use: No  . Sexual activity: Yes    Partners: Male    Birth control/protection: Surgical  Other Topics Concern  . Not on  file  Social History Narrative  . Not on file   Social Determinants of Health   Financial Resource Strain: Not on file  Food Insecurity: Not on file  Transportation Needs: Not on file  Physical Activity: Not on file  Stress: Not on file  Social Connections: Not on file    Allergies:  Allergies  Allergen Reactions  . Amoxicillin Anaphylaxis, Hives, Shortness Of Breath, Swelling and Rash    Has patient had a PCN reaction causing immediate rash, facial/tongue/throat swelling, SOB or lightheadedness with hypotension: Yes Has patient had a PCN reaction causing severe rash involving mucus membranes or skin necrosis: Yes Has patient had a PCN reaction that required hospitalization No Has patient had a PCN reaction occurring within the last 10 years: No If all of the above answers are "NO", then may proceed with Cephalosporin use. Other reaction(s): ANAPHYLAXIS  Other reaction(s): ANAPHYLAX  . Penicillins Anaphylaxis, Swelling, Rash, Shortness Of Breath and Hives    Has patient had a PCN reaction causing immediate rash, facial/tongue/throat swelling, SOB or lightheadedness with hypotension: Yes Has patient had a PCN reaction causing severe rash involving mucus membranes or skin necrosis: Yes Has patient had a PCN reaction that required hospitalization No Has patient had a PCN reaction occurring within the last 10 years: No If all of the above answers are "NO", then may proceed with Cephalosporin use.  Other reaction(s): ANAPHYLAXIS Other reaction(s): ANAPHYLAXIS Has patient had a PCN reaction causing immediate rash, facial/tongue/throat swelling, SOB or lightheadedness with hypotension: Yes Has patient had a PCN reaction causing severe rash involving mucus membranes or skin necrosis: Yes Has patient had a PCN reaction that required hospitalization No Has patient had a PCN reaction occurring within the last 10 years: No If all of the above answers are "NO", then may proceed with  Cephalosporin use. Other reaction(s): ANAPHYLAXIS Other reaction(s): ANAPHYLAX Has patient had a PCN reaction causing immediate rash, facial/tongue/throat swelling, SOB or lightheadedness with hypotension: Yes Has patient had a PCN reaction causing severe rash involving mucus membranes or skin necrosis: Yes Has patient had a PCN reaction that required hospitalization No Has patient had a PCN reaction occurring within the last 10 years: No If all of the above answers are "NO", then may proceed with Cephalosporin use. Other reaction(s): ANAPHYLAXIS Other reaction(s): ANAPHYLAXIS   . Morphine Other (See Comments)    Muscle spasms  . Sulfa Antibiotics Nausea Only    Other reaction(s): VOMITING Other reaction(s): VOMITING Other reaction(s): VOMITING    Metabolic Disorder Labs: Lab Results  Component Value Date   HGBA1C 5.5 12/11/2019   MPG 111 12/11/2019   Lab Results  Component Value Date   PROLACTIN 12.1 12/11/2019   Lab Results  Component Value Date   CHOL 162 12/11/2019   TRIG 51 12/11/2019   HDL 87 12/11/2019   CHOLHDL 1.9 12/11/2019   VLDL 10 12/11/2019   LDLCALC 65 12/11/2019   Lab Results  Component Value Date   TSH 1.886 12/11/2019   TSH  05/13/2009    3.163 (NOTE) Please note change in reference ranges for ages 66W to 91Y. Test methodology is 3rd generation TSH    Therapeutic Level Labs: No results found for: LITHIUM No results found for: VALPROATE No components found for:  CBMZ  Current Medications: Current Outpatient Medications  Medication Sig Dispense Refill  . lamoTRIgine (LAMICTAL) 25 MG tablet Take 1 tablet (25 mg total) by mouth daily. Take along with 150 mg daily 30 tablet 1  . acetaminophen (TYLENOL) 325 MG tablet Take 650 mg by mouth every 4 (four) hours as needed for moderate pain.     [name] azithromycin (ZITHROMAX) 250 MG tablet Take 1 tablet (250 mg total) by mouth daily. Take first 2 tablets together, then 1 every day until finished. 6 tablet 0   . buPROPion (WELLBUTRIN XL) 150 MG 24 hr tablet TAKE 2 TABLETS(300 MG) BY MOUTH DAILY 60 tablet 3  . cetirizine-pseudoephedrine (ZYRTEC-D) 5-120 MG tablet Take 1 tablet by mouth daily. 30 tablet 0  . cyanocobalamin (,VITAMIN B-12,) 1000 MCG/ML injection Inject 1,000 mcg into the muscle every 30 (thirty) days.     . diphenoxylate-atropine (LOMOTIL) 2.5-0.025 MG tablet     . doxycycline (MONODOX) 100 MG capsule     . doxycycline (VIBRA-TABS) 100 MG tablet     . doxycycline (VIBRAMYCIN) 100 MG capsule Take 1 capsule (  100 mg total) by mouth 2 (two) times daily. 14 capsule 0  . fluticasone (FLONASE) 50 MCG/ACT nasal spray Place 2 sprays into both nostrils daily. 16 g 0  . gabapentin (NEURONTIN) 300 MG capsule Take by mouth.    . gabapentin (NEURONTIN) 300 MG capsule Take by mouth.    [name] guaiFENesin (MUCINEX) 600 MG 12 hr tablet Take 2 tablets (1,200 mg total) by mouth 2 (two) times daily as needed. 14 tablet 0  . hydrOXYzine (ATARAX/VISTARIL) 25 MG tablet TAKE 1/2 TO 1 TABLET(12.5 TO 25 MG) BY MOUTH TWICE DAILY AS NEEDED FOR ANXIETY 60 tablet 2  . ibuprofen (ADVIL,MOTRIN) 600 MG tablet Take 1 tablet (600 mg total) by mouth every 6 (six) hours as needed. 20 tablet 0  . lamoTRIgine (LAMICTAL) 150 MG tablet TAKE 1 TABLET(150 MG) BY MOUTH DAILY 30 tablet 2  . loratadine (CLARITIN) 10 MG tablet Take 10 mg by mouth daily.    . metoprolol succinate (TOPROL-XL) 25 MG 24 hr tablet Take by mouth.    . Multiple Vitamins-Minerals (HM MULTIVITAMIN ADULT GUMMY) CHEW Chew 2 tablets by mouth daily.    . ondansetron (ZOFRAN ODT) 4 MG disintegrating tablet Take 1 tablet (4 mg total) by mouth every 8 (eight) hours as needed. 20 tablet 0  . predniSONE (STERAPRED UNI-PAK 21 TAB) 10 MG (21) TBPK tablet Take by mouth daily. Take 6 tabs by mouth daily  for 1 day, then 5 tabs for 1 day, then 4 tabs for 1 day, then 3 tabs for 1 day, 2 tabs for 1 day, then 1 tab by mouth daily for 1 day 21 tablet 0  . SUMAtriptan (IMITREX) 100  MG tablet Take 100 mg by mouth every 2 (two) hours as needed for migraine or headache.     . thiamine (VITAMIN B-1) 100 MG tablet Take 100 mg by mouth.    . topiramate (TOPAMAX) 50 MG tablet TAKE 1 TABLET BY MOUTH IN THE MORNING, AND 2 TABLETS EVERY NIGHT AT BEDTIME    . traMADol (ULTRAM) 50 MG tablet Take by mouth.    . Vilazodone HCl (VIIBRYD) 40 MG TABS TAKE 1 TABLET(40 MG) BY MOUTH DAILY 30 tablet 2  . zolpidem (AMBIEN CR) 12.5 MG CR tablet TAKE 1 TABLET(12.5 MG) BY MOUTH AT BEDTIME AS NEEDED FOR SLEEP 30 tablet 2   No current facility-administered medications for this visit.     Musculoskeletal: Strength & Muscle Tone: UTA Gait & Station: UTA Patient leans: N/A  Psychiatric Specialty Exam: Review of Systems  Psychiatric/Behavioral: Positive for dysphoric mood.  All other systems reviewed and are negative.   There were no vitals taken for this visit.There is no height or weight on file to calculate BMI.  General Appearance: Casual  Eye Contact:  Fair  Speech:  Clear and Coherent  Volume:  Normal  Mood:  Depressed  Affect:  Congruent  Thought Process:  Goal Directed and Descriptions of Associations: Intact  Orientation:  Full (Time, Place, and Person)  Thought Content: Rumination   Suicidal Thoughts:  No  Homicidal Thoughts:  No  Memory:  Immediate;   Fair Recent;   Fair Remote;   Fair  Judgement:  Fair  Insight:  Good  Psychomotor Activity:  Normal  Concentration:  Concentration: Fair and Attention Span: Fair  Recall:  [employer] of Knowledge: Fair  Language: Fair  Akathisia:  No  Handed:  Right  AIMS (if indicated): UTA  Assets:  Communication Skills Desire for Improvement Housing Social  Support  ADL's:  Intact  Cognition: WNL  Sleep:  Fair   Screenings:   Assessment and Plan: [name] is a 45 year old Caucasian female who has a history of depression, anxiety, insomnia was evaluated by telemedicine today.  Patient is currently struggling with  depressive symptoms and will benefit from the following medication changes.  Plan as noted below.   MDD-unstable Wellbutrin XL 300 mg p.o. daily-reduced dosage Viibryd 40 mg p.o. daily Increase Lamictal to 175 mg p.o. daily. Discontinue Abilify-patient has been tapered off.  PTSD/panic disorder-stable Lamictal as prescribed Viibryd 40 mg p.o. daily Hydroxyzine as needed Continue CBT as needed  Insomnia-stable Ambien CR 12.5 mg p.o. nightly Hydroxyzine 25 mg p.o. nightly as needed  Follow-up in clinic in 3 to 4 weeks or sooner if needed.  I have spent atleast 20 minutes face to face by video with patient today. More than 50 % of the time was spent for preparing to see the patient ( e.g., review of test, records ), ordering medications and test ,psychoeducation and supportive psychotherapy and care coordination,as well as documenting clinical information in electronic health record. This note was generated in part or whole with voice recognition software. Voice recognition is usually quite accurate but there are transcription errors that can and very often do occur. I apologize for any typographical errors that were not detected and corrected.        Ursula Alert, MD 02/25/2020, 9:22 AM

## 2020-03-05 ENCOUNTER — Other Ambulatory Visit: Payer: Self-pay | Admitting: Psychiatry

## 2020-03-05 DIAGNOSIS — F4310 Post-traumatic stress disorder, unspecified: Secondary | ICD-10-CM

## 2020-03-05 DIAGNOSIS — F410 Panic disorder [episodic paroxysmal anxiety] without agoraphobia: Secondary | ICD-10-CM

## 2020-03-05 DIAGNOSIS — F330 Major depressive disorder, recurrent, mild: Secondary | ICD-10-CM

## 2020-03-05 NOTE — Telephone Encounter (Signed)
Patient uses [facility] for Ambien refill.  Will not recommend sending Ambien to multiple pharmacies since she has refills pending at [facility].

## 2020-03-05 NOTE — Telephone Encounter (Signed)
received a fax requesting a  90 day supply on the zolpiem tart er 12.5mg 

## 2020-03-14 ENCOUNTER — Telehealth: Payer: Self-pay

## 2020-03-14 NOTE — Telephone Encounter (Signed)
zolpidem tart er 12.5 mg approve until 02-14-21

## 2020-03-18 ENCOUNTER — Other Ambulatory Visit: Payer: Self-pay

## 2020-03-18 ENCOUNTER — Encounter: Payer: Self-pay | Admitting: Psychiatry

## 2020-03-18 ENCOUNTER — Telehealth (INDEPENDENT_AMBULATORY_CARE_PROVIDER_SITE_OTHER): Payer: Medicare Other | Admitting: Psychiatry

## 2020-03-18 DIAGNOSIS — G4701 Insomnia due to medical condition: Secondary | ICD-10-CM

## 2020-03-18 DIAGNOSIS — F4310 Post-traumatic stress disorder, unspecified: Secondary | ICD-10-CM | POA: Diagnosis not present

## 2020-03-18 DIAGNOSIS — F331 Major depressive disorder, recurrent, moderate: Secondary | ICD-10-CM | POA: Diagnosis not present

## 2020-03-18 DIAGNOSIS — F410 Panic disorder [episodic paroxysmal anxiety] without agoraphobia: Secondary | ICD-10-CM

## 2020-03-18 MED ORDER — ARIPIPRAZOLE 2 MG PO TABS: 2.0000 mg | ORAL_TABLET | Freq: Every day | ORAL | 0 refills | Status: DC

## 2020-03-18 NOTE — Progress Notes (Signed)
Virtual Visit via Video Note  I connected with [name] on 03/18/20 at  1:40 PM EST by a video enabled telemedicine application and verified that I am speaking with the correct person using two identifiers.  Location Provider Location : [facility] Patient Location : Car  Participants: Patient , Provider   I discussed the limitations of evaluation and management by telemedicine and the availability of in person appointments. The patient expressed understanding and agreed to proceed.    I discussed the assessment and treatment plan with the patient. The patient was provided an opportunity to ask questions and all were answered. The patient agreed with the plan and demonstrated an understanding of the instructions.   The patient was advised to call back or seek an in-person evaluation if the symptoms worsen or if the condition fails to improve as anticipated.  [facility] MD OP Progress Note  03/18/2020 2:49 PM [name]  MRN:  [id number]  Chief Complaint:  Chief Complaint    Follow-up     HPI: [name] is a 45 year old Caucasian female, divorced, lives in [place], has a history of MDD, panic attacks, PTSD, insomnia, was evaluated by telemedicine today.  Patient reports she is currently struggling with sadness, crying spell, low energy, anhedonia, lack of motivation, reduced appetite.  She also struggles with racing thoughts.  She reports her current symptoms started once she stopped taking the Abilify.  She reports her symptoms as getting worse and not responding to the increased dosage of Lamictal.  Patient reports she is tired of feeling this way and wants to get back on Abilify if possible.  Patient also has been noncompliant with psychotherapy sessions.  She reports her previous therapist does not take her health insurance plan anymore.  Patient denies any suicidality, homicidality or perceptual disturbances.  Patient reports sleep is good on the  Ambien.  Patient denies any other concerns today.  Visit Diagnosis:    ICD-10-CM   1. MDD (major depressive disorder), recurrent episode, moderate (HCC)  F33.1 ARIPiprazole (ABILIFY) 2 MG tablet  2. Panic disorder  F41.0   3. PTSD (post-traumatic stress disorder)  F43.10   4. Insomnia due to medical condition  G47.01    pain, mood disorder    Past Psychiatric History: I have reviewed past psychiatric history from my progress note on 05/09/2017  Past Medical History:  Past Medical History:  Diagnosis Date  . Abnormal ECG   . Anemia   . Anxiety   . Arthritis   . B12 deficiency   . Cardiomyopathy ([place])   . Chronic abdominal pain   . Complication of anesthesia    difficulty to get sedated during endoscopy  . COVID-19 01/18/2019  . DDD (degenerative disc disease), lumbar   . Depression   . Diabetes mellitus without complication ([place])   . Dysrhythmia   . GERD (gastroesophageal reflux disease)   . Headache   . Iron deficiency   . Neuropathy   . Obesity   . Ovarian cyst   . Pneumonia    within past five years  . PTSD (post-traumatic stress disorder)   . PTSD (post-traumatic stress disorder)   . Rh negative status during pregnancy   . Seizures ([place])   . Small bowel obstruction [facility])     Past Surgical History:  Procedure Laterality Date  . ABDOMINAL HYSTERECTOMY    . BILATERAL SALPINGECTOMY Bilateral 09/29/2015   Procedure: BILATERAL SALPINGECTOMY;  Surgeon: Benjaman Kindler, MD;  Location: [facility] ORS;  Service: Gynecology;  Laterality: Bilateral;  . bowel obstruction  2011  . CHOLECYSTECTOMY    . COLONOSCOPY WITH PROPOFOL N/A 01/24/2018   Procedure: COLONOSCOPY WITH PROPOFOL;  Surgeon: Toledo, Benay Pike, MD;  Location: [facility] ENDOSCOPY;  Service: Gastroenterology;  Laterality: N/A;  . DILATION AND CURETTAGE OF UTERUS    . ESOPHAGOGASTRODUODENOSCOPY    . ESOPHAGOGASTRODUODENOSCOPY (EGD) WITH PROPOFOL N/A 01/24/2018   Procedure: ESOPHAGOGASTRODUODENOSCOPY (EGD) WITH PROPOFOL;   Surgeon: Toledo, Benay Pike, MD;  Location: [facility] ENDOSCOPY;  Service: Gastroenterology;  Laterality: N/A;  . GASTRIC BYPASS    . gi bleed  2009   Surgery-was in ICU for three weeks  . HERNIA REPAIR    . OVARIAN CYST REMOVAL Left 09/29/2015   Procedure: OVARIAN CYSTECTOMY;  Surgeon: Benjaman Kindler, MD;  Location: [facility] ORS;  Service: Gynecology;  Laterality: Left;  . ROUX-EN-Y PROCEDURE    . VAGINAL HYSTERECTOMY N/A 09/29/2015   Procedure: HYSTERECTOMY VAGINAL;  Surgeon: Benjaman Kindler, MD;  Location: [facility] ORS;  Service: Gynecology;  Laterality: N/A;    Family Psychiatric History: I have reviewed family psychiatric history from my progress note on 05/09/2017  Family History:  Family History  Problem Relation Age of Onset  . Arthritis/Rheumatoid Mother   . Heart block Mother   . Clotting disorder Mother   . Osteoporosis Mother   . Heart attack Mother   . Anxiety disorder Mother   . Depression Mother   . Heart disease Father   . Heart attack Father   . Depression Sister   . Anxiety disorder Sister     Social History: I have reviewed social history from my progress note on 05/09/2017 Social History   Socioeconomic History  . Marital status: Single    Spouse name: Not on file  . Number of children: Not on file  . Years of education: Not on file  . Highest education level: Not on file  Occupational History  . Not on file  Tobacco Use  . Smoking status: Never Smoker  . Smokeless tobacco: Never Used  Vaping Use  . Vaping Use: Never used  Substance and Sexual Activity  . Alcohol use: No    Alcohol/week: 0.0 standard drinks  . Drug use: No  . Sexual activity: Yes    Partners: Male    Birth control/protection: Surgical  Other Topics Concern  . Not on file  Social History Narrative  . Not on file   Social Determinants of Health   Financial Resource Strain: Not on file  Food Insecurity: Not on file  Transportation Needs: Not on file  Physical Activity: Not on file   Stress: Not on file  Social Connections: Not on file    Allergies:  Allergies  Allergen Reactions  . Amoxicillin Anaphylaxis, Hives, Shortness Of Breath, Swelling and Rash    Has patient had a PCN reaction causing immediate rash, facial/tongue/throat swelling, SOB or lightheadedness with hypotension: Yes Has patient had a PCN reaction causing severe rash involving mucus membranes or skin necrosis: Yes Has patient had a PCN reaction that required hospitalization No Has patient had a PCN reaction occurring within the last 10 years: No If all of the above answers are "NO", then may proceed with Cephalosporin use. Other reaction(s): ANAPHYLAXIS Other reaction(s): ANAPHYLAX  . Penicillins Anaphylaxis, Swelling, Rash, Shortness Of Breath and Hives    Has patient had a PCN reaction causing immediate rash, facial/tongue/throat swelling, SOB or lightheadedness with hypotension: Yes Has patient had a PCN reaction causing severe rash involving mucus membranes or skin  necrosis: Yes Has patient had a PCN reaction that required hospitalization No Has patient had a PCN reaction occurring within the last 10 years: No If all of the above answers are "NO", then may proceed with Cephalosporin use.  Other reaction(s): ANAPHYLAXIS Other reaction(s): ANAPHYLAXIS Has patient had a PCN reaction causing immediate rash, facial/tongue/throat swelling, SOB or lightheadedness with hypotension: Yes Has patient had a PCN reaction causing severe rash involving mucus membranes or skin necrosis: Yes Has patient had a PCN reaction that required hospitalization No Has patient had a PCN reaction occurring within the last 10 years: No If all of the above answers are "NO", then may proceed with Cephalosporin use. Other reaction(s): ANAPHYLAXIS Other reaction(s): ANAPHYLAX Has patient had a PCN reaction causing immediate rash, facial/tongue/throat swelling, SOB or lightheadedness with hypotension: Yes Has patient had a  PCN reaction causing severe rash involving mucus membranes or skin necrosis: Yes Has patient had a PCN reaction that required hospitalization No Has patient had a PCN reaction occurring within the last 10 years: No If all of the above answers are "NO", then may proceed with Cephalosporin use. Other reaction(s): ANAPHYLAXIS Other reaction(s): ANAPHYLAXIS   . Morphine Other (See Comments)    Muscle spasms  . Sulfa Antibiotics Nausea Only    Other reaction(s): VOMITING Other reaction(s): VOMITING Other reaction(s): VOMITING    Metabolic Disorder Labs: Lab Results  Component Value Date   HGBA1C 5.5 12/11/2019   MPG 111 12/11/2019   Lab Results  Component Value Date   PROLACTIN 12.1 12/11/2019   Lab Results  Component Value Date   CHOL 162 12/11/2019   TRIG 51 12/11/2019   HDL 87 12/11/2019   CHOLHDL 1.9 12/11/2019   VLDL 10 12/11/2019   LDLCALC 65 12/11/2019   Lab Results  Component Value Date   TSH 1.886 12/11/2019   TSH  05/13/2009    3.163 (NOTE) Please note change in reference ranges for ages 47W to 40Y.Test methodology is 3rd generation TSH    Therapeutic Level Labs: No results found for: LITHIUM No results found for: VALPROATE No components found for:  CBMZ  Current Medications: Current Outpatient Medications  Medication Sig Dispense Refill  . ARIPiprazole (ABILIFY) 2 MG tablet Take 1 tablet (2 mg total) by mouth daily. 30 tablet 0  . acetaminophen (TYLENOL) 325 MG tablet Take 650 mg by mouth every 4 (four) hours as needed for moderate pain.     [name] azithromycin (ZITHROMAX) 250 MG tablet Take 1 tablet (250 mg total) by mouth daily. Take first 2 tablets together, then 1 every day until finished. 6 tablet 0  . buPROPion (WELLBUTRIN XL) 150 MG 24 hr tablet TAKE 2 TABLETS(300 MG) BY MOUTH DAILY 60 tablet 3  . cetirizine-pseudoephedrine (ZYRTEC-D) 5-120 MG tablet Take 1 tablet by mouth daily. 30 tablet 0  . cyanocobalamin (,VITAMIN B-12,) 1000 MCG/ML injection Inject  1,000 mcg into the muscle every 30 (thirty) days.     . diphenoxylate-atropine (LOMOTIL) 2.5-0.025 MG tablet     . doxycycline (MONODOX) 100 MG capsule     . doxycycline (VIBRA-TABS) 100 MG tablet     . doxycycline (VIBRAMYCIN) 100 MG capsule Take 1 capsule (100 mg total) by mouth 2 (two) times daily. 14 capsule 0  . fluticasone (FLONASE) 50 MCG/ACT nasal spray Place 2 sprays into both nostrils daily. 16 g 0  . gabapentin (NEURONTIN) 300 MG capsule Take by mouth.    . gabapentin (NEURONTIN) 300 MG capsule Take by mouth.    [name]  guaiFENesin (MUCINEX) 600 MG 12 hr tablet Take 2 tablets (1,200 mg total) by mouth 2 (two) times daily as needed. 14 tablet 0  . hydrOXYzine (ATARAX/VISTARIL) 25 MG tablet TAKE 1/2 TO 1 TABLET(12.5 TO 25 MG) BY MOUTH TWICE DAILY AS NEEDED FOR ANXIETY 60 tablet 2  . ibuprofen (ADVIL,MOTRIN) 600 MG tablet Take 1 tablet (600 mg total) by mouth every 6 (six) hours as needed. 20 tablet 0  . lamoTRIgine (LAMICTAL) 150 MG tablet TAKE 1 TABLET(150 MG) BY MOUTH DAILY 30 tablet 2  . lamoTRIgine (LAMICTAL) 25 MG tablet Take 1 tablet (25 mg total) by mouth daily. Take along with 150 mg daily 30 tablet 1  . loratadine (CLARITIN) 10 MG tablet Take 10 mg by mouth daily.    . metoprolol succinate (TOPROL-XL) 25 MG 24 hr tablet Take by mouth.    . Multiple Vitamins-Minerals (HM MULTIVITAMIN ADULT GUMMY) CHEW Chew 2 tablets by mouth daily.    . ondansetron (ZOFRAN ODT) 4 MG disintegrating tablet Take 1 tablet (4 mg total) by mouth every 8 (eight) hours as needed. 20 tablet 0  . predniSONE (STERAPRED UNI-PAK 21 TAB) 10 MG (21) TBPK tablet Take by mouth daily. Take 6 tabs by mouth daily  for 1 day, then 5 tabs for 1 day, then 4 tabs for 1 day, then 3 tabs for 1 day, 2 tabs for 1 day, then 1 tab by mouth daily for 1 day 21 tablet 0  . SUMAtriptan (IMITREX) 100 MG tablet Take 100 mg by mouth every 2 (two) hours as needed for migraine or headache.     . thiamine (VITAMIN B-1) 100 MG tablet Take 100  mg by mouth.    . topiramate (TOPAMAX) 50 MG tablet TAKE 1 TABLET BY MOUTH IN THE MORNING, AND 2 TABLETS EVERY NIGHT AT BEDTIME    . traMADol (ULTRAM) 50 MG tablet Take by mouth.    . Vilazodone HCl (VIIBRYD) 40 MG TABS TAKE 1 TABLET(40 MG) BY MOUTH DAILY 30 tablet 2  . zolpidem (AMBIEN CR) 12.5 MG CR tablet TAKE 1 TABLET(12.5 MG) BY MOUTH AT BEDTIME AS NEEDED FOR SLEEP 30 tablet 2   No current facility-administered medications for this visit.     Musculoskeletal: Strength & Muscle Tone: UTA Gait & Station: normal Patient leans: N/A  Psychiatric Specialty Exam: Review of Systems  Psychiatric/Behavioral: Positive for dysphoric mood.  All other systems reviewed and are negative.   There were no vitals taken for this visit.There is no height or weight on file to calculate BMI.  General Appearance: Casual  Eye Contact:  Fair  Speech:  Clear and Coherent  Volume:  Normal  Mood:  Depressed  Affect:  Tearful  Thought Process:  Goal Directed and Descriptions of Associations: Intact  Orientation:  Full (Time, Place, and Person)  Thought Content: Rumination   Suicidal Thoughts:  No  Homicidal Thoughts:  No  Memory:  Immediate;   Fair Recent;   Fair Remote;   Fair  Judgement:  Fair  Insight:  Fair  Psychomotor Activity:  Normal  Concentration:  Concentration: Fair and Attention Span: Fair  Recall:  [employer] of Knowledge: Fair  Language: Fair  Akathisia:  No  Handed:  Right  AIMS (if indicated): UTA  Assets:  Communication Skills Desire for Improvement Housing Social Support  ADL's:  Intact  Cognition: WNL  Sleep:  Fair   Screenings: PHQ2-9   Flowsheet Row Video Visit from 03/18/2020 in [place]  PHQ-2  Total Score 6  PHQ-9 Total Score 18       Assessment and Plan: [name] is a 45 year old Caucasian female who has a history of depression, anxiety, insomnia was evaluated by telemedicine today.  Patient is currently struggling  with depressive symptoms since being off of the Abilify.  Patient with psychosocial stressors of the pandemic, her own pain.  She will benefit from the following plan.  Plan MDD-unstable PHQ 9 today equals 18 Continue Wellbutrin XL 300 mg p.o. daily-reduced dosage Viibryd 40 mg p.o. daily Lamictal 175 mg p.o. daily Restart Abilify 2 mg p.o. daily Will refer patient to her therapist here at the practice.  PTSD/panic disorder-stable Lamictal as prescribed Viibryd 40 mg p.o. daily Hydroxyzine 25 mg p.o. nightly as needed Continue CBT.  Insomnia-stable Ambien CR 12.5 mg p.o. nightly Hydroxyzine 25 mg p.o. nightly as needed  Discussed referral for TMS therapy if she continues to decompensate.  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  Follow-up in clinic in 2 weeks or sooner if needed.  I have spent atleast 20 minutes face to face by video with patient today. More than 50 % of the time was spent for preparing to see the patient ( e.g., review of test, records ), ordering medications and test ,psychoeducation and supportive psychotherapy and care coordination,as well as documenting clinical information in electronic health record. This note was generated in part or whole with voice recognition software. Voice recognition is usually quite accurate but there are transcription errors that can and very often do occur. I apologize for any typographical errors that were not detected and corrected.      Ursula Alert, MD 03/18/2020, 2:49 PM

## 2020-03-26 ENCOUNTER — Ambulatory Visit: Payer: Medicare Other | Admitting: Licensed Clinical Social Worker

## 2020-04-03 ENCOUNTER — Other Ambulatory Visit: Payer: Self-pay

## 2020-04-03 ENCOUNTER — Telehealth (INDEPENDENT_AMBULATORY_CARE_PROVIDER_SITE_OTHER): Payer: Medicare Other | Admitting: Psychiatry

## 2020-04-03 ENCOUNTER — Encounter: Payer: Self-pay | Admitting: Psychiatry

## 2020-04-03 DIAGNOSIS — F410 Panic disorder [episodic paroxysmal anxiety] without agoraphobia: Secondary | ICD-10-CM

## 2020-04-03 DIAGNOSIS — F4310 Post-traumatic stress disorder, unspecified: Secondary | ICD-10-CM | POA: Diagnosis not present

## 2020-04-03 DIAGNOSIS — F331 Major depressive disorder, recurrent, moderate: Secondary | ICD-10-CM | POA: Diagnosis not present

## 2020-04-03 DIAGNOSIS — F5101 Primary insomnia: Secondary | ICD-10-CM

## 2020-04-03 MED ORDER — LAMOTRIGINE 200 MG PO TABS: 100.0000 mg | ORAL_TABLET | Freq: Two times a day (BID) | ORAL | 0 refills | Status: DC

## 2020-04-03 MED ORDER — ZOLPIDEM TARTRATE ER 12.5 MG PO TBCR: EXTENDED_RELEASE_TABLET | ORAL | 2 refills | Status: DC

## 2020-04-03 NOTE — Progress Notes (Signed)
Virtual Visit via Video Note  I connected with [name] on 04/03/20 at  9:00 AM EST by a video enabled telemedicine application and verified that I am speaking with the correct person using two identifiers. Location Provider Location : [facility] Patient Location : Home  Participants: Patient , Provider   I discussed the limitations of evaluation and management by telemedicine and the availability of in person appointments. The patient expressed understanding and agreed to proceed.   I discussed the assessment and treatment plan with the patient. The patient was provided an opportunity to ask questions and all were answered. The patient agreed with the plan and demonstrated an understanding of the instructions.   The patient was advised to call back or seek an in-person evaluation if the symptoms worsen or if the condition fails to improve as anticipated. [place] MD OP Progress Note  04/03/2020 1:30 PM [name]  MRN:  [id number]  Chief Complaint:  Chief Complaint    Follow-up     HPI: [name] is a 45 year old Caucasian female, divorced, lives in [place], has a history of MDD, panic attacks, PTSD, insomnia was evaluated by telemedicine today.  Patient reports she continues to struggle with depressive symptoms.  She reports she is struggling with lack of motivation, low energy, reduced appetite.  She is constantly worrying about the past as well as the future.  She reports she has psychosocial stressors of medical problems including her pain as well as financial stressors and relationship struggles.  Patient however today reports that she did not start the Abilify that was restarted last visit.  She reports she did not want to take more medications.  She also did not follow-up with psychotherapy recommendations.  Patient today reports she had a talk with her pastor and she is currently taking her life one day at a time.  Patient reports she is trying to focus on  the present moment and is trying to cope with her stressors.  Patient reports she is not interested in adding more medications today however is okay with increasing the Lamictal dosage.  Patient also wants to think about starting psychotherapy sessions again and reports she will let writer know.  She denies any suicidality, homicidality or perceptual disturbances.  She is constantly in pain and reports right now.  Her pain is at a 6 out of 10, 10 being the worst.  She has pain in her back, hands and her feet.  She however reports the 6 out of 10 is good for her and she has regular follow-ups with her providers.  Patient denies any other concerns today.  Visit Diagnosis:    ICD-10-CM   1. MDD (major depressive disorder), recurrent episode, moderate (HCC)  F33.1 lamoTRIgine (LAMICTAL) 200 MG tablet  2. Panic disorder  F41.0 zolpidem (AMBIEN CR) 12.5 MG CR tablet  3. PTSD (post-traumatic stress disorder)  F43.10 zolpidem (AMBIEN CR) 12.5 MG CR tablet  4. Primary insomnia  F51.01     Past Psychiatric History: I have reviewed past psychiatric history from my progress note on 05/09/2017  Past Medical History:  Past Medical History:  Diagnosis Date  . Abnormal ECG   . Anemia   . Anxiety   . Arthritis   . B12 deficiency   . Cardiomyopathy ([place])   . Chronic abdominal pain   . Complication of anesthesia    difficulty to get sedated during endoscopy  . COVID-19 01/18/2019  . DDD (degenerative disc disease), lumbar   .  Depression   . Diabetes mellitus without complication ([place])   . Dysrhythmia   . GERD (gastroesophageal reflux disease)   . Headache   . Iron deficiency   . Neuropathy   . Obesity   . Ovarian cyst   . Pneumonia    within past five years  . PTSD (post-traumatic stress disorder)   . PTSD (post-traumatic stress disorder)   . Rh negative status during pregnancy   . Seizures ([place])   . Small bowel obstruction [facility])     Past Surgical History:  Procedure Laterality Date   . ABDOMINAL HYSTERECTOMY    . BILATERAL SALPINGECTOMY Bilateral 09/29/2015   Procedure: BILATERAL SALPINGECTOMY;  Surgeon: Benjaman Kindler, MD;  Location: [facility] ORS;  Service: Gynecology;  Laterality: Bilateral;  . bowel obstruction  2011  . CHOLECYSTECTOMY    . COLONOSCOPY WITH PROPOFOL N/A 01/24/2018   Procedure: COLONOSCOPY WITH PROPOFOL;  Surgeon: Toledo, Benay Pike, MD;  Location: [facility] ENDOSCOPY;  Service: Gastroenterology;  Laterality: N/A;  . DILATION AND CURETTAGE OF UTERUS    . ESOPHAGOGASTRODUODENOSCOPY    . ESOPHAGOGASTRODUODENOSCOPY (EGD) WITH PROPOFOL N/A 01/24/2018   Procedure: ESOPHAGOGASTRODUODENOSCOPY (EGD) WITH PROPOFOL;  Surgeon: Toledo, Benay Pike, MD;  Location: [facility] ENDOSCOPY;  Service: Gastroenterology;  Laterality: N/A;  . GASTRIC BYPASS    . gi bleed  2009   Surgery-was in ICU for three weeks  . HERNIA REPAIR    . OVARIAN CYST REMOVAL Left 09/29/2015   Procedure: OVARIAN CYSTECTOMY;  Surgeon: Benjaman Kindler, MD;  Location: [facility] ORS;  Service: Gynecology;  Laterality: Left;  . ROUX-EN-Y PROCEDURE    . VAGINAL HYSTERECTOMY N/A 09/29/2015   Procedure: HYSTERECTOMY VAGINAL;  Surgeon: Benjaman Kindler, MD;  Location: [facility] ORS;  Service: Gynecology;  Laterality: N/A;    Family Psychiatric History: I have reviewed family psychiatric history from my progress note from 05/09/2017  Family History:  Family History  Problem Relation Age of Onset  . Arthritis/Rheumatoid Mother   . Heart block Mother   . Clotting disorder Mother   . Osteoporosis Mother   . Heart attack Mother   . Anxiety disorder Mother   . Depression Mother   . Heart disease Father   . Heart attack Father   . Depression Sister   . Anxiety disorder Sister     Social History: I have reviewed social history from my progress note on 05/09/2017 Social History   Socioeconomic History  . Marital status: Single    Spouse name: Not on file  . Number of children: Not on file  . Years of education: Not on  file  . Highest education level: Not on file  Occupational History  . Not on file  Tobacco Use  . Smoking status: Never Smoker  . Smokeless tobacco: Never Used  Vaping Use  . Vaping Use: Never used  Substance and Sexual Activity  . Alcohol use: No    Alcohol/week: 0.0 standard drinks  . Drug use: No  . Sexual activity: Yes    Partners: Male    Birth control/protection: Surgical  Other Topics Concern  . Not on file  Social History Narrative  . Not on file   Social Determinants of Health   Financial Resource Strain: Not on file  Food Insecurity: Not on file  Transportation Needs: Not on file  Physical Activity: Not on file  Stress: Not on file  Social Connections: Not on file    Allergies:  Allergies  Allergen Reactions  . Amoxicillin Anaphylaxis, Hives, Shortness Of Breath,  Swelling and Rash    Has patient had a PCN reaction causing immediate rash, facial/tongue/throat swelling, SOB or lightheadedness with hypotension: Yes Has patient had a PCN reaction causing severe rash involving mucus membranes or skin necrosis: Yes Has patient had a PCN reaction that required hospitalization No Has patient had a PCN reaction occurring within the last 10 years: No If all of the above answers are "NO", then may proceed with Cephalosporin use. Other reaction(s): ANAPHYLAXIS Other reaction(s): ANAPHYLAX  . Penicillins Anaphylaxis, Swelling, Rash, Shortness Of Breath and Hives    Has patient had a PCN reaction causing immediate rash, facial/tongue/throat swelling, SOB or lightheadedness with hypotension: Yes Has patient had a PCN reaction causing severe rash involving mucus membranes or skin necrosis: Yes Has patient had a PCN reaction that required hospitalization No Has patient had a PCN reaction occurring within the last 10 years: No If all of the above answers are "NO", then may proceed with Cephalosporin use.  Other reaction(s): ANAPHYLAXIS Other reaction(s): ANAPHYLAXIS Has  patient had a PCN reaction causing immediate rash, facial/tongue/throat swelling, SOB or lightheadedness with hypotension: Yes Has patient had a PCN reaction causing severe rash involving mucus membranes or skin necrosis: Yes Has patient had a PCN reaction that required hospitalization No Has patient had a PCN reaction occurring within the last 10 years: No If all of the above answers are "NO", then may proceed with Cephalosporin use. Other reaction(s): ANAPHYLAXIS Other reaction(s): ANAPHYLAX Has patient had a PCN reaction causing immediate rash, facial/tongue/throat swelling, SOB or lightheadedness with hypotension: Yes Has patient had a PCN reaction causing severe rash involving mucus membranes or skin necrosis: Yes Has patient had a PCN reaction that required hospitalization No Has patient had a PCN reaction occurring within the last 10 years: No If all of the above answers are "NO", then may proceed with Cephalosporin use. Other reaction(s): ANAPHYLAXIS Other reaction(s): ANAPHYLAXIS   . Morphine Other (See Comments)    Muscle spasms  . Sulfa Antibiotics Nausea Only    Other reaction(s): VOMITING Other reaction(s): VOMITING Other reaction(s): VOMITING    Metabolic Disorder Labs: Lab Results  Component Value Date   HGBA1C 5.5 12/11/2019   MPG 111 12/11/2019   Lab Results  Component Value Date   PROLACTIN 12.1 12/11/2019   Lab Results  Component Value Date   CHOL 162 12/11/2019   TRIG 51 12/11/2019   HDL 87 12/11/2019   CHOLHDL 1.9 12/11/2019   VLDL 10 12/11/2019   LDLCALC 65 12/11/2019   Lab Results  Component Value Date   TSH 1.886 12/11/2019   TSH  05/13/2009    3.163 (NOTE)  Please note change in reference ranges for ages 75W to 54Y.Test methodology is 3rd generation TSH    Therapeutic Level Labs: No results found for: LITHIUM No results found for: VALPROATE No components found for:  CBMZ  Current Medications: Current Outpatient Medications  Medication  Sig Dispense Refill  . lamoTRIgine (LAMICTAL) 200 MG tablet Take 0.5 tablets (100 mg total) by mouth 2 (two) times daily. 90 tablet 0  . acetaminophen (TYLENOL) 325 MG tablet Take 650 mg by mouth every 4 (four) hours as needed for moderate pain.     [name] azithromycin (ZITHROMAX) 250 MG tablet Take 1 tablet (250 mg total) by mouth daily. Take first 2 tablets together, then 1 every day until finished. 6 tablet 0  . buPROPion (WELLBUTRIN XL) 150 MG 24 hr tablet TAKE 2 TABLETS(300 MG) BY MOUTH DAILY 60 tablet 3  .  cetirizine-pseudoephedrine (ZYRTEC-D) 5-120 MG tablet Take 1 tablet by mouth daily. 30 tablet 0  . cyanocobalamin (,VITAMIN B-12,) 1000 MCG/ML injection Inject 1,000 mcg into the muscle every 30 (thirty) days.     . diphenoxylate-atropine (LOMOTIL) 2.5-0.025 MG tablet     . doxycycline (MONODOX) 100 MG capsule     . doxycycline (VIBRA-TABS) 100 MG tablet     . doxycycline (VIBRAMYCIN) 100 MG capsule Take 1 capsule (100 mg total) by mouth 2 (two) times daily. 14 capsule 0  . fluticasone (FLONASE) 50 MCG/ACT nasal spray Place 2 sprays into both nostrils daily. 16 g 0  . gabapentin (NEURONTIN) 300 MG capsule Take by mouth.    . gabapentin (NEURONTIN) 300 MG capsule Take by mouth.    [name] guaiFENesin (MUCINEX) 600 MG 12 hr tablet Take 2 tablets (1,200 mg total) by mouth 2 (two) times daily as needed. 14 tablet 0  . hydrOXYzine (ATARAX/VISTARIL) 25 MG tablet TAKE 1/2 TO 1 TABLET(12.5 TO 25 MG) BY MOUTH TWICE DAILY AS NEEDED FOR ANXIETY 60 tablet 2  . ibuprofen (ADVIL,MOTRIN) 600 MG tablet Take 1 tablet (600 mg total) by mouth every 6 (six) hours as needed. 20 tablet 0  . loratadine (CLARITIN) 10 MG tablet Take 10 mg by mouth daily.    . metoprolol succinate (TOPROL-XL) 25 MG 24 hr tablet Take by mouth.    . Multiple Vitamins-Minerals (HM MULTIVITAMIN ADULT GUMMY) CHEW Chew 2 tablets by mouth daily.    . ondansetron (ZOFRAN ODT) 4 MG disintegrating tablet Take 1 tablet (4 mg total) by mouth every 8  (eight) hours as needed. 20 tablet 0  . predniSONE (STERAPRED UNI-PAK 21 TAB) 10 MG (21) TBPK tablet Take by mouth daily. Take 6 tabs by mouth daily  for 1 day, then 5 tabs for 1 day, then 4 tabs for 1 day, then 3 tabs for 1 day, 2 tabs for 1 day, then 1 tab by mouth daily for 1 day 21 tablet 0  . SUMAtriptan (IMITREX) 100 MG tablet Take 100 mg by mouth every 2 (two) hours as needed for migraine or headache.     . thiamine (VITAMIN B-1) 100 MG tablet Take 100 mg by mouth.    . topiramate (TOPAMAX) 50 MG tablet TAKE 1 TABLET BY MOUTH IN THE MORNING, AND 2 TABLETS EVERY NIGHT AT BEDTIME    . traMADol (ULTRAM) 50 MG tablet Take by mouth.    . Vilazodone HCl (VIIBRYD) 40 MG TABS TAKE 1 TABLET(40 MG) BY MOUTH DAILY 30 tablet 2  . zolpidem (AMBIEN CR) 12.5 MG CR tablet TAKE 1 TABLET(12.5 MG) BY MOUTH AT BEDTIME AS NEEDED FOR SLEEP 30 tablet 2   No current facility-administered medications for this visit.     Musculoskeletal: Strength & Muscle Tone: UTA Gait & Station: normal Patient leans: N/A  Psychiatric Specialty Exam: Review of Systems  Constitutional: Positive for fatigue.  Musculoskeletal: Positive for arthralgias and back pain.  Psychiatric/Behavioral: Positive for decreased concentration and dysphoric mood. The patient is nervous/anxious.   All other systems reviewed and are negative.   There were no vitals taken for this visit.There is no height or weight on file to calculate BMI.  General Appearance: Casual  Eye Contact:  Fair  Speech:  Normal Rate  Volume:  Normal  Mood:  Anxious and Depressed  Affect:  Congruent  Thought Process:  Goal Directed and Descriptions of Associations: Intact  Orientation:  Full (Time, Place, and Person)  Thought Content: Logical   Suicidal  Thoughts:  No  Homicidal Thoughts:  No  Memory:  Immediate;   Fair Recent;   Fair Remote;   Fair  Judgement:  Fair  Insight:  Fair  Psychomotor Activity:  Normal  Concentration:  Concentration: Fair and  Attention Span: Fair  Recall:  [employer] of Knowledge: Fair  Language: Fair  Akathisia:  No  Handed:  Right  AIMS (if indicated): UTA  Assets:  Communication Skills Desire for Improvement Housing Social Support  ADL's:  Intact  Cognition: WNL  Sleep:  Fair   Screenings: PHQ2-9   Flowsheet Row Video Visit from 04/03/2020 in [place] Video Visit from 03/18/2020 in [facility] [place]  PHQ-2 Total Score 4 6  PHQ-9 Total Score 12 18    Flowsheet Row Video Visit from 04/03/2020 in [place] No Risk       Assessment and Plan: RIFKY LAPRE is a 45 year old Caucasian female who has a history of depression, anxiety, insomnia was evaluated by telemedicine today.  Patient is currently struggling with depression and anxiety symptoms, has multiple medical problems including pain.  Patient also noncompliant with medication as well as psychotherapy recommendations.  Patient will benefit from the following plan.  Plan MDD-unstable PHQ 9 today equals 12 Discontinue Abilify for noncompliance Increase Lamictal to 100 mg twice a day. Continue Wellbutrin XL 300 mg p.o. daily-reduced dosage. Viibryd 40 mg p.o. daily Patient was referred for psychotherapy sessions last visit however she has been noncompliant.  She agrees to think about it and Biochemist, clinical know.  PTSD/panic disorder-stable Lamictal as prescribed Viibryd 40 mg p.o. daily Hydroxyzine 25 mg p.o. nightly as needed   Insomnia-stable Ambien CR 12.5 mg p.o. nightly Hydroxyzine 25 mg p.o. nightly as needed  Also discussed referral for [place] if she continues to decompensate.  Encouraged compliance with medication as well as psychotherapy recommendations.  Follow-up in clinic in 2 to 3 weeks or sooner if needed.  I have spent atleast 20 minutes face to face by video with patient today. More than 50 % of the time was spent for  preparing to see the patient ( e.g., review of test, records ),ordering medications and test ,psychoeducation and supportive psychotherapy and care coordination,as well as documenting clinical information in electronic health record. This note was generated in part or whole with voice recognition software. Voice recognition is usually quite accurate but there are transcription errors that can and very often do occur. I apologize for any typographical errors that were not detected and corrected.     Ursula Alert, MD 04/04/2020, 4:19 AM

## 2020-04-23 ENCOUNTER — Telehealth (INDEPENDENT_AMBULATORY_CARE_PROVIDER_SITE_OTHER): Payer: Medicare Other | Admitting: Psychiatry

## 2020-04-23 ENCOUNTER — Encounter: Payer: Self-pay | Admitting: Psychiatry

## 2020-04-23 ENCOUNTER — Other Ambulatory Visit: Payer: Self-pay

## 2020-04-23 DIAGNOSIS — F410 Panic disorder [episodic paroxysmal anxiety] without agoraphobia: Secondary | ICD-10-CM

## 2020-04-23 DIAGNOSIS — F5101 Primary insomnia: Secondary | ICD-10-CM | POA: Diagnosis not present

## 2020-04-23 DIAGNOSIS — F4310 Post-traumatic stress disorder, unspecified: Secondary | ICD-10-CM

## 2020-04-23 DIAGNOSIS — F3341 Major depressive disorder, recurrent, in partial remission: Secondary | ICD-10-CM | POA: Diagnosis not present

## 2020-04-23 NOTE — Progress Notes (Signed)
Virtual Visit via Video Note  I connected with [name] on 04/23/20 at  9:00 AM EST by a video enabled telemedicine application and verified that I am speaking with the correct person using two identifiers.  Location Provider Location : [facility] Patient Location : Home  Participants: Patient , Provider   I discussed the limitations of evaluation and management by telemedicine and the availability of in person appointments. The patient expressed understanding and agreed to proceed.    I discussed the assessment and treatment plan with the patient. The patient was provided an opportunity to ask questions and all were answered. The patient agreed with the plan and demonstrated an understanding of the instructions.   The patient was advised to call back or seek an in-person evaluation if the symptoms worsen or if the condition fails to improve as anticipated.  [facility] MD OP Progress Note  04/23/2020 9:21 AM TRELLA THURMOND  MRN:  [id number]  Chief Complaint:  Chief Complaint    Follow-up; Anxiety; Depression     HPI: [name] is a 45 year old Caucasian female, divorced, lives in [place], has a history of MDD, panic attacks, PTSD, insomnia was evaluated by telemedicine today.  Patient today reports she has noticed improvement in her depressive symptoms.  She continues to struggle with low energy or feeling drained out.  She however reports it could also be because she is due for her infusions and has to go to the cancer center soon for the same.  She has a history of anemia.  Patient reports she however feels more motivated than before.  She reports appetite is fair.  Patient denies any suicidality, homicidality or perceptual disturbances.  Patient is compliant on medications and is tolerating the higher dosage of Lamictal well.  Patient reports she is interested in starting psychotherapy sessions and is agreeable to scheduling appointment with therapist at our  practice.  Patient denies any other concerns today.  Visit Diagnosis:    ICD-10-CM   1. MDD (major depressive disorder), recurrent, in partial remission ([place])  F33.41   2. Panic disorder  F41.0   3. PTSD (post-traumatic stress disorder)  F43.10   4. Primary insomnia  F51.01     Past Psychiatric History: I have reviewed past psychiatric history from my progress note on 05/09/2017  Past Medical History:  Past Medical History:  Diagnosis Date  . Abnormal ECG   . Anemia   . Anxiety   . Arthritis   . B12 deficiency   . Cardiomyopathy ([place])   . Chronic abdominal pain   . Complication of anesthesia    difficulty to get sedated during endoscopy  . COVID-19 01/18/2019  . DDD (degenerative disc disease), lumbar   . Depression   . Diabetes mellitus without complication ([place])   . Dysrhythmia   . GERD (gastroesophageal reflux disease)   . Headache   . Iron deficiency   . Neuropathy   . Obesity   . Ovarian cyst   . Pneumonia    within past five years  . PTSD (post-traumatic stress disorder)   . PTSD (post-traumatic stress disorder)   . Rh negative status during pregnancy   . Seizures ([place])   . Small bowel obstruction [facility])     Past Surgical History:  Procedure Laterality Date  . ABDOMINAL HYSTERECTOMY    . BILATERAL SALPINGECTOMY Bilateral 09/29/2015   Procedure: BILATERAL SALPINGECTOMY;  Surgeon: Benjaman Kindler, MD;  Location: [facility] ORS;  Service: Gynecology;  Laterality: Bilateral;  . bowel  obstruction  2011  . CHOLECYSTECTOMY    . COLONOSCOPY WITH PROPOFOL N/A 01/24/2018   Procedure: COLONOSCOPY WITH PROPOFOL;  Surgeon: Toledo, Benay Pike, MD;  Location: [facility] ENDOSCOPY;  Service: Gastroenterology;  Laterality: N/A;  . DILATION AND CURETTAGE OF UTERUS    . ESOPHAGOGASTRODUODENOSCOPY    . ESOPHAGOGASTRODUODENOSCOPY (EGD) WITH PROPOFOL N/A 01/24/2018   Procedure: ESOPHAGOGASTRODUODENOSCOPY (EGD) WITH PROPOFOL;  Surgeon: Toledo, Benay Pike, MD;  Location: [facility] ENDOSCOPY;   Service: Gastroenterology;  Laterality: N/A;  . GASTRIC BYPASS    . gi bleed  2009   Surgery-was in ICU for three weeks  . HERNIA REPAIR    . OVARIAN CYST REMOVAL Left 09/29/2015   Procedure: OVARIAN CYSTECTOMY;  Surgeon: Benjaman Kindler, MD;  Location: [facility] ORS;  Service: Gynecology;  Laterality: Left;  . ROUX-EN-Y PROCEDURE    . VAGINAL HYSTERECTOMY N/A 09/29/2015   Procedure: HYSTERECTOMY VAGINAL;  Surgeon: Benjaman Kindler, MD;  Location: [facility] ORS;  Service: Gynecology;  Laterality: N/A;    Family Psychiatric History: Reviewed family psychiatric history from my progress note on 05/09/2017  Family History:  Family History  Problem Relation Age of Onset  . Arthritis/Rheumatoid Mother   . Heart block Mother   . Clotting disorder Mother   . Osteoporosis Mother   . Heart attack Mother   . Anxiety disorder Mother   . Depression Mother   . Heart disease Father   . Heart attack Father   . Depression Sister   . Anxiety disorder Sister     Social History: Reviewed social history from my progress note on 05/09/2017 Social History   Socioeconomic History  . Marital status: Single    Spouse name: Not on file  . Number of children: Not on file  . Years of education: Not on file  . Highest education level: Not on file  Occupational History  . Not on file  Tobacco Use  . Smoking status: Never Smoker  . Smokeless tobacco: Never Used  Vaping Use  . Vaping Use: Never used  Substance and Sexual Activity  . Alcohol use: No    Alcohol/week: 0.0 standard drinks  . Drug use: No  . Sexual activity: Yes    Partners: Male    Birth control/protection: Surgical  Other Topics Concern  . Not on file  Social History Narrative  . Not on file   Social Determinants of Health   Financial Resource Strain: Not on file  Food Insecurity: Not on file  Transportation Needs: Not on file  Physical Activity: Not on file  Stress: Not on file  Social Connections: Not on file    Allergies:   Allergies  Allergen Reactions  . Amoxicillin Anaphylaxis, Hives, Shortness Of Breath, Swelling and Rash    Has patient had a PCN reaction causing immediate rash, facial/tongue/throat swelling, SOB or lightheadedness with hypotension: Yes Has patient had a PCN reaction causing severe rash involving mucus membranes or skin necrosis: Yes Has patient had a PCN reaction that required hospitalization No Has patient had a PCN reaction occurring within the last 10 years: No If all of the above answers are "NO", then may proceed with Cephalosporin use. Other reaction(s): ANAPHYLAXIS Other reaction(s): ANAPHYLAX  . Penicillins Anaphylaxis, Swelling, Rash, Shortness Of Breath and Hives    Has patient had a PCN reaction causing immediate rash, facial/tongue/throat swelling, SOB or lightheadedness with hypotension: Yes Has patient had a PCN reaction causing severe rash involving mucus membranes or skin necrosis: Yes Has patient had a PCN reaction that  required hospitalization No Has patient had a PCN reaction occurring within the last 10 years: No If all of the above answers are "NO", then may proceed with Cephalosporin use.  Other reaction(s): ANAPHYLAXIS Other reaction(s): ANAPHYLAXIS Has patient had a PCN reaction causing immediate rash, facial/tongue/throat swelling, SOB or lightheadedness with hypotension: Yes Has patient had a PCN reaction causing severe rash involving mucus membranes or skin necrosis: Yes Has patient had a PCN reaction that required hospitalization No Has patient had a PCN reaction occurring within the last 10 years: No If all of the above answers are "NO", then may proceed with Cephalosporin use. Other reaction(s): ANAPHYLAXIS Other reaction(s): ANAPHYLAX Has patient had a PCN reaction causing immediate rash, facial/tongue/throat swelling, SOB or lightheadedness with hypotension: Yes Has patient had a PCN reaction causing severe rash involving mucus membranes or skin  necrosis: Yes Has patient had a PCN reaction that required hospitalization No Has patient had a PCN reaction occurring within the last 10 years: No If all of the above answers are "NO", then may proceed with Cephalosporin use. Other reaction(s): ANAPHYLAXIS Other reaction(s): ANAPHYLAXIS   . Morphine Other (See Comments)    Muscle spasms  . Sulfa Antibiotics Nausea Only    Other reaction(s): VOMITING Other reaction(s): VOMITING Other reaction(s): VOMITING    Metabolic Disorder Labs: Lab Results  Component Value Date   HGBA1C 5.5 12/11/2019   MPG 111 12/11/2019   Lab Results  Component Value Date   PROLACTIN 12.1 12/11/2019   Lab Results  Component Value Date   CHOL 162 12/11/2019   TRIG 51 12/11/2019   HDL 87 12/11/2019   CHOLHDL 1.9 12/11/2019   VLDL 10 12/11/2019   LDLCALC 65 12/11/2019   Lab Results  Component Value Date   TSH 1.886 12/11/2019   TSH  05/13/2009    3.163 (NOTE)  Please note change in reference ranges for ages 59W to 48Y.Test methodology is 3rd generation TSH    Therapeutic Level Labs: No results found for: LITHIUM No results found for: VALPROATE No components found for:  CBMZ  Current Medications: Current Outpatient Medications  Medication Sig Dispense Refill  . acetaminophen (TYLENOL) 325 MG tablet Take 650 mg by mouth every 4 (four) hours as needed for moderate pain.     [name] buPROPion (WELLBUTRIN XL) 150 MG 24 hr tablet TAKE 2 TABLETS(300 MG) BY MOUTH DAILY 60 tablet 3  . cyanocobalamin (,VITAMIN B-12,) 1000 MCG/ML injection Inject 1,000 mcg into the muscle every 30 (thirty) days.     . diphenoxylate-atropine (LOMOTIL) 2.5-0.025 MG tablet     . gabapentin (NEURONTIN) 300 MG capsule Take by mouth.    . gabapentin (NEURONTIN) 300 MG capsule Take by mouth.    . hydrOXYzine (ATARAX/VISTARIL) 25 MG tablet TAKE 1/2 TO 1 TABLET(12.5 TO 25 MG) BY MOUTH TWICE DAILY AS NEEDED FOR ANXIETY 60 tablet 2  . lamoTRIgine (LAMICTAL) 200 MG tablet Take 0.5  tablets (100 mg total) by mouth 2 (two) times daily. 90 tablet 0  . loratadine (CLARITIN) 10 MG tablet Take 10 mg by mouth daily.    . metoprolol succinate (TOPROL-XL) 25 MG 24 hr tablet Take by mouth.    . Multiple Vitamins-Minerals (HM MULTIVITAMIN ADULT GUMMY) CHEW Chew 2 tablets by mouth daily.    . ondansetron (ZOFRAN ODT) 4 MG disintegrating tablet Take 1 tablet (4 mg total) by mouth every 8 (eight) hours as needed. 20 tablet 0  . SUMAtriptan (IMITREX) 100 MG tablet Take 100 mg by mouth every  2 (two) hours as needed for migraine or headache.     . thiamine (VITAMIN B-1) 100 MG tablet Take 100 mg by mouth.    . topiramate (TOPAMAX) 50 MG tablet TAKE 1 TABLET BY MOUTH IN THE MORNING, AND 2 TABLETS EVERY NIGHT AT BEDTIME    . traMADol (ULTRAM) 50 MG tablet Take by mouth.    . Vilazodone HCl (VIIBRYD) 40 MG TABS TAKE 1 TABLET(40 MG) BY MOUTH DAILY 30 tablet 2  . zolpidem (AMBIEN CR) 12.5 MG CR tablet TAKE 1 TABLET(12.5 MG) BY MOUTH AT BEDTIME AS NEEDED FOR SLEEP 30 tablet 2   No current facility-administered medications for this visit.     Musculoskeletal: Strength & Muscle Tone: UTA Gait & Station: normal Patient leans: N/A  Psychiatric Specialty Exam: Review of Systems  Constitutional: Positive for fatigue.  Psychiatric/Behavioral: Positive for dysphoric mood. The patient is nervous/anxious.   All other systems reviewed and are negative.   There were no vitals taken for this visit.There is no height or weight on file to calculate BMI.  General Appearance: Casual  Eye Contact:  Fair  Speech:  Clear and Coherent  Volume:  Normal  Mood:  Anxious and Dysphoric Improving  Affect:  Congruent  Thought Process:  Goal Directed and Descriptions of Associations: Intact  Orientation:  Full (Time, Place, and Person)  Thought Content: Logical   Suicidal Thoughts:  No  Homicidal Thoughts:  No  Memory:  Immediate;   Fair Recent;   Fair Remote;   Fair  Judgement:  Fair  Insight:  Fair   Psychomotor Activity:  Normal  Concentration:  Concentration: Fair and Attention Span: Fair  Recall:  [employer] of Knowledge: Fair  Language: Fair  Akathisia:  No  Handed:  Right  AIMS (if indicated): UTA  Assets:  Communication Skills Desire for Improvement Housing Social Support  ADL's:  Intact  Cognition: WNL  Sleep:  Fair   Screenings: PHQ2-9   Flowsheet Row Video Visit from 04/23/2020 in [facility] Video Visit from 04/03/2020 in [facility] Video Visit from 03/18/2020 in [facility]  PHQ-2 Total Score 3 4 6   PHQ-9 Total Score 7 12 18     Flowsheet Row Video Visit from 04/03/2020 in [facility] [place]  C-SSRS RISK CATEGORY No Risk       Assessment and Plan: [name] is a 45 year old Caucasian female who has a history of depression, anxiety, insomnia was evaluated by telemedicine today.  Patient is currently making progress with regards to her depression.  She however will benefit from psychotherapy sessions.  Plan as noted below.  Plan MDD-in partial remission PHQ 9 today equals 7 Continue Lamictal 100 mg p.o. twice daily Wellbutrin XL 300 mg p.o. daily-reduced dosage Viibryd 40 mg p.o. daily Refer for CBT.  PTSD/panic disorder-stable Lamictal as prescribed Viibryd 40 mg p.o. daily Hydroxyzine 25 mg p.o. nightly as needed  Insomnia-stable Ambien CR 12.5 mg p.o. nightly Hydroxyzine 25 mg p.o. nightly as needed  Follow-up in clinic in 6 to 8 weeks or sooner if needed.  In the meantime patient advised to pursue psychotherapy sessions.  This note was generated in part or whole with voice recognition software. Voice recognition is usually quite accurate but there are transcription errors that can and very often do occur. I apologize for any typographical errors that were not detected and corrected.      Ursula Alert, MD 04/23/2020, 9:21 AM

## 2020-04-24 ENCOUNTER — Telehealth: Payer: Self-pay | Admitting: *Deleted

## 2020-04-24 ENCOUNTER — Inpatient Hospital Stay: Payer: Medicare Other | Attending: Oncology

## 2020-04-24 DIAGNOSIS — D509 Iron deficiency anemia, unspecified: Secondary | ICD-10-CM | POA: Diagnosis not present

## 2020-04-24 LAB — CBC WITH DIFFERENTIAL/PLATELET
Abs Immature Granulocytes: 0.02 10*3/uL (ref 0.00–0.07)
Basophils Absolute: 0.1 10*3/uL (ref 0.0–0.1)
Basophils Relative: 1 %
Eosinophils Absolute: 0.1 10*3/uL (ref 0.0–0.5)
Eosinophils Relative: 2 %
HCT: 42.1 % (ref 36.0–46.0)
Hemoglobin: 13.4 g/dL (ref 12.0–15.0)
Immature Granulocytes: 0 %
Lymphocytes Relative: 27 %
Lymphs Abs: 1.6 10*3/uL (ref 0.7–4.0)
MCH: 29.3 pg (ref 26.0–34.0)
MCHC: 31.8 g/dL (ref 30.0–36.0)
MCV: 92.1 fL (ref 80.0–100.0)
Monocytes Absolute: 0.6 10*3/uL (ref 0.1–1.0)
Monocytes Relative: 10 %
Neutro Abs: 3.6 10*3/uL (ref 1.7–7.7)
Neutrophils Relative %: 60 %
Platelets: 277 10*3/uL (ref 150–400)
RBC: 4.57 MIL/uL (ref 3.87–5.11)
RDW: 14 % (ref 11.5–15.5)
WBC: 6 10*3/uL (ref 4.0–10.5)
nRBC: 0 % (ref 0.0–0.2)

## 2020-04-24 LAB — IRON AND TIBC
Iron: 124 ug/dL (ref 28–170)
Saturation Ratios: 33 % — ABNORMAL HIGH (ref 10.4–31.8)
TIBC: 371 ug/dL (ref 250–450)
UIBC: 247 ug/dL

## 2020-04-24 LAB — FERRITIN: Ferritin: 49 ng/mL (ref 11–307)

## 2020-04-24 NOTE — Telephone Encounter (Signed)
FYI...   Pt called and left a VM wanting to cx her appt with [facility]/+/- IV Iron sched on 04/25/20. She stated that she viewed her labs results on MyChart and her labs are fine and that she didn't have any need to keep that appt.  Both appts were cx per pt request

## 2020-04-25 ENCOUNTER — Inpatient Hospital Stay: Payer: Medicare Other

## 2020-04-25 ENCOUNTER — Inpatient Hospital Stay: Payer: Medicare Other | Admitting: Oncology

## 2020-04-27 ENCOUNTER — Ambulatory Visit
Admission: EM | Admit: 2020-04-27 | Discharge: 2020-04-27 | Disposition: A | Discharge status: Home / Self Care | Payer: Medicare Other | Attending: Family Medicine | Admitting: Family Medicine

## 2020-04-27 ENCOUNTER — Other Ambulatory Visit: Payer: Self-pay

## 2020-04-27 DIAGNOSIS — N3000 Acute cystitis without hematuria: Secondary | ICD-10-CM | POA: Insufficient documentation

## 2020-04-27 DIAGNOSIS — B379 Candidiasis, unspecified: Secondary | ICD-10-CM | POA: Insufficient documentation

## 2020-04-27 LAB — URINALYSIS, COMPLETE (UACMP) WITH MICROSCOPIC
Bilirubin Urine: NEGATIVE
Glucose, UA: NEGATIVE mg/dL
Ketones, ur: NEGATIVE mg/dL
Nitrite: NEGATIVE
Protein, ur: NEGATIVE mg/dL
Specific Gravity, Urine: 1.01 (ref 1.005–1.030)
WBC, UA: >50 WBC/hpf (ref 0–5)
pH: 5.5 (ref 5.0–8.0)

## 2020-04-27 MED ORDER — FLUCONAZOLE 150 MG PO TABS: 150.0000 mg | ORAL_TABLET | Freq: Once | ORAL | 0 refills | Status: AC

## 2020-04-27 MED ORDER — NITROFURANTOIN MONOHYD MACRO 100 MG PO CAPS: 100.0000 mg | ORAL_CAPSULE | Freq: Two times a day (BID) | ORAL | 0 refills | Status: DC

## 2020-04-27 NOTE — ED Triage Notes (Signed)
Pt c/o urine odor, urinary urgency/frequency, burning and some cramping for several days, increasing today. Pt also reports some chills. Pt denies f/n/v/d or other symptoms.

## 2020-04-27 NOTE — Discharge Instructions (Addendum)
Medication as prescribed.  Take care  Dr. Chriss Mannan  

## 2020-04-27 NOTE — ED Provider Notes (Signed)
[facility]    CSN: [id number] Arrival date & time: 04/27/20  0804      History   Chief Complaint Chief Complaint  Patient presents with  . Dysuria   HPI  45 year old female presents with urinary symptoms.  Patient reports that she has had symptoms for the past few days.  Worsened today.  She reports urinary odor, frequency and urgency.  She reports dysuria.  She states that she feels like she is having spasms.  No fever.  She does endorse chills.  Some back pain.  No flank pain.  No significant abdominal pain.  No relieving factors.  No other associated symptoms.  No other complaints.  Past Medical History:  Diagnosis Date  . Abnormal ECG   . Anemia   . Anxiety   . Arthritis   . B12 deficiency   . Cardiomyopathy ([place])   . Chronic abdominal pain   . Complication of anesthesia    difficulty to get sedated during endoscopy  . COVID-19 01/18/2019  . DDD (degenerative disc disease), lumbar   . Depression   . Diabetes mellitus without complication ([place])   . Dysrhythmia   . GERD (gastroesophageal reflux disease)   . Headache   . Iron deficiency   . Neuropathy   . Obesity   . Ovarian cyst   . Pneumonia    within past five years  . PTSD (post-traumatic stress disorder)   . PTSD (post-traumatic stress disorder)   . Rh negative status during pregnancy   . Seizures ([place])   . Small bowel obstruction [facility])     Patient Active Problem List   Diagnosis Date Noted  . Primary insomnia 04/03/2020  . High risk medication use 10/08/2019  . MDD (major depressive disorder), recurrent, in full remission ([place]) 08/13/2019  . Insomnia due to medical condition 05/23/2019  . MDD (major depressive disorder), recurrent, severe, with psychosis ([place]) 03/12/2019  . MDD (major depressive disorder), recurrent episode, moderate ([place]) 09/15/2018  . Panic disorder 09/15/2018  . PTSD (post-traumatic stress disorder) 09/15/2018  . Insomnia due to mental condition 09/15/2018  .  Panniculitis 08/01/2018  . Neuropathy 07/26/2018  . Iron deficiency anemia 11/12/2015  . Uterine fibroid 09/30/2015  . Abnormal uterine bleeding 09/29/2015  . Abdominal pain, generalized 03/27/2015  . Degeneration of intervertebral disc of lumbar region 12/18/2014  . Abnormal ECG 10/09/2014  . Achilles tendinitis 10/09/2014  . Anxiety 10/09/2014  . DDD (degenerative disc disease), lumbar 10/09/2014  . Clinical depression 10/09/2014  . Class 1 obesity 10/09/2014  . Family planning 06/06/2014  . Fibroid 06/06/2014  . Bariatric surgery status 06/06/2014  . Avitaminosis D 04/22/2014  . Achilles bursitis 04/12/2014  . DDD (degenerative disc disease), lumbosacral 04/12/2014  . LBP (low back pain) 04/12/2014  . Abdominal pain, right upper quadrant 04/12/2014  . Degeneration of intervertebral disc of lumbosacral region 04/12/2014  . Diabetes mellitus arising in pregnancy 04/01/2014  . Personal history of surgery to heart and great vessels, presenting hazards to health 04/01/2014  . Other specified postprocedural states 04/01/2014  . Episode of syncope 02/04/2014  . Breathlessness on exertion 02/04/2014  . Awareness of heartbeats 12/11/2013  . Cardiomyopathy ([place]) 10/29/2013  . Dizziness 10/29/2013  . Mixed incontinence 08/31/2012  . Adiposity 08/31/2012  . Chronic pain associated with significant psychosocial dysfunction 05/12/2012  . Disorder of sacrum 05/12/2012  . Arthropathy of lumbar facet joint 05/12/2012  . Lumbar and sacral osteoarthritis 05/12/2012  . Arthralgia, sacroiliac 05/12/2012  Past Surgical History:  Procedure Laterality Date  . ABDOMINAL HYSTERECTOMY    . BILATERAL SALPINGECTOMY Bilateral 09/29/2015   Procedure: BILATERAL SALPINGECTOMY;  Surgeon: Benjaman Kindler, MD;  Location: [facility] ORS;  Service: Gynecology;  Laterality: Bilateral;  . bowel obstruction  2011  . CHOLECYSTECTOMY    . COLONOSCOPY WITH PROPOFOL N/A 01/24/2018   Procedure: COLONOSCOPY WITH  PROPOFOL;  Surgeon: Toledo, Benay Pike, MD;  Location: [facility] ENDOSCOPY;  Service: Gastroenterology;  Laterality: N/A;  . DILATION AND CURETTAGE OF UTERUS    . ESOPHAGOGASTRODUODENOSCOPY    . ESOPHAGOGASTRODUODENOSCOPY (EGD) WITH PROPOFOL N/A 01/24/2018   Procedure: ESOPHAGOGASTRODUODENOSCOPY (EGD) WITH PROPOFOL;  Surgeon: Toledo, Benay Pike, MD;  Location: [facility] ENDOSCOPY;  Service: Gastroenterology;  Laterality: N/A;  . GASTRIC BYPASS    . gi bleed  2009   Surgery-was in ICU for three weeks  . HERNIA REPAIR    . OVARIAN CYST REMOVAL Left 09/29/2015   Procedure: OVARIAN CYSTECTOMY;  Surgeon: Benjaman Kindler, MD;  Location: [facility] ORS;  Service: Gynecology;  Laterality: Left;  . ROUX-EN-Y PROCEDURE    . VAGINAL HYSTERECTOMY N/A 09/29/2015   Procedure: HYSTERECTOMY VAGINAL;  Surgeon: Benjaman Kindler, MD;  Location: [facility] ORS;  Service: Gynecology;  Laterality: N/A;    OB History   No obstetric history on file.      Home Medications    Prior to Admission medications   Medication Sig Start Date End Date Taking? Authorizing Provider  acetaminophen (TYLENOL) 325 MG tablet Take 650 mg by mouth every 4 (four) hours as needed for moderate pain.  04/03/14  Yes [provider]  buPROPion (WELLBUTRIN XL) 150 MG 24 hr tablet TAKE 2 TABLETS(300 MG) BY MOUTH DAILY 02/25/20  Yes Eappen, Ria Clock, MD  cyanocobalamin (,VITAMIN B-12,) 1000 MCG/ML injection Inject 1,000 mcg into the muscle every 30 (thirty) days.  01/13/15  Yes [provider]  diphenoxylate-atropine (LOMOTIL) 2.5-0.025 MG tablet  10/04/17  Yes [provider]  fluconazole (DIFLUCAN) 150 MG tablet Take 1 tablet (150 mg total) by mouth once for 1 dose. Repeat dose in 72 hours. 04/27/20 04/27/20 Yes Karena Kinker G, DO  gabapentin (NEURONTIN) 300 MG capsule Take by mouth. 11/05/19  Yes [provider]  hydrOXYzine (ATARAX/VISTARIL) 25 MG tablet TAKE 1/2 TO 1 TABLET(12.5 TO 25 MG) BY MOUTH TWICE DAILY AS NEEDED FOR ANXIETY  12/27/19  Yes Ursula Alert, MD  lamoTRIgine (LAMICTAL) 200 MG tablet Take 0.5 tablets (100 mg total) by mouth 2 (two) times daily. 04/03/20  Yes Ursula Alert, MD  metoprolol succinate (TOPROL-XL) 25 MG 24 hr tablet Take by mouth. 12/12/17 04/27/20 Yes [provider]  Multiple Vitamins-Minerals (HM MULTIVITAMIN ADULT GUMMY) CHEW Chew 2 tablets by mouth daily.   Yes [provider]  nitrofurantoin, macrocrystal-monohydrate, (MACROBID) 100 MG capsule Take 1 capsule (100 mg total) by mouth 2 (two) times daily. 04/27/20  Yes Arel Tippen G, DO  SUMAtriptan (IMITREX) 100 MG tablet Take 100 mg by mouth every 2 (two) hours as needed for migraine or headache.    Yes [provider]  topiramate (TOPAMAX) 50 MG tablet TAKE 1 TABLET BY MOUTH IN THE MORNING, AND 2 TABLETS EVERY NIGHT AT BEDTIME 11/01/17  Yes [provider]  traMADol (ULTRAM) 50 MG tablet Take by mouth. 12/10/19  Yes [provider]  Vilazodone HCl (VIIBRYD) 40 MG TABS TAKE 1 TABLET(40 MG) BY MOUTH DAILY 02/25/20  Yes Eappen, Saramma, MD  zolpidem (AMBIEN CR) 12.5 MG CR tablet TAKE 1 TABLET(12.5 MG) BY MOUTH AT  BEDTIME AS NEEDED FOR SLEEP 04/03/20  Yes Ursula Alert, MD  lamoTRIgine (LAMICTAL) 25 MG tablet Take by mouth. 04/03/20   [provider]  loratadine (CLARITIN) 10 MG tablet Take 10 mg by mouth daily. 03/29/19   [provider]  thiamine (VITAMIN B-1) 100 MG tablet Take 100 mg by mouth. 07/22/08   [provider]    Family History Family History  Problem Relation Age of Onset  . Arthritis/Rheumatoid Mother   . Heart block Mother   . Clotting disorder Mother   . Osteoporosis Mother   . Heart attack Mother   . Anxiety disorder Mother   . Depression Mother   . Heart disease Father   . Heart attack Father   . Depression Sister   . Anxiety disorder Sister     Social History Social History   Tobacco Use  . Smoking status: Never Smoker  . Smokeless tobacco:  Never Used  Vaping Use  . Vaping Use: Never used  Substance Use Topics  . Alcohol use: No    Alcohol/week: 0.0 standard drinks  . Drug use: No     Allergies   Amoxicillin, Penicillins, Morphine, and Sulfa antibiotics   Review of Systems Review of Systems Per HPI  Physical Exam Triage Vital Signs ED Triage Vitals  Enc Vitals Group     BP 04/27/20 0814 112/76     Pulse Rate 04/27/20 0814 80     Resp 04/27/20 0814 18     Temp 04/27/20 0814 98.3 F (36.8 C)     Temp Source 04/27/20 0814 Oral     SpO2 04/27/20 0814 100 %     Weight 04/27/20 0811 150 lb (68 kg)     Height 04/27/20 0811 5\' 7"  (1.702 m)     Head Circumference --      Peak Flow --      Pain Score 04/27/20 0810 6     Pain Loc --      Pain Edu? --      Excl. in [place]? --    Updated Vital Signs BP 112/76 (BP Location: Left Arm)   Pulse 80   Temp 98.3 F (36.8 C) (Oral)   Resp 18   Ht 5\' 7"  (1.702 m)   Wt 68 kg   LMP  (LMP Unknown)   SpO2 100%   BMI 23.49 kg/m   Visual Acuity Right Eye Distance:   Left Eye Distance:   Bilateral Distance:    Right Eye Near:   Left Eye Near:    Bilateral Near:     Physical Exam Vitals and nursing note reviewed.  Constitutional:      General: She is not in acute distress.    Appearance: Normal appearance. She is not ill-appearing.  HENT:     Head: Normocephalic and atraumatic.  Eyes:     General:        Right eye: No discharge.        Left eye: No discharge.     Conjunctiva/sclera: Conjunctivae normal.  Cardiovascular:     Rate and Rhythm: Normal rate and regular rhythm.     Heart sounds: No murmur heard.   Pulmonary:     Effort: Pulmonary effort is normal.     Breath sounds: Normal breath sounds. No wheezing, rhonchi or rales.  Abdominal:     General: There is no distension.     Palpations: Abdomen is soft.     Tenderness: There is no abdominal tenderness.  Neurological:  Mental Status: She is alert.  Psychiatric:        Mood and Affect: Mood  normal.        Behavior: Behavior normal.    UC Treatments / Results  Labs (all labs ordered are listed, but only abnormal results are displayed) Labs Reviewed  URINALYSIS, COMPLETE (UACMP) WITH MICROSCOPIC - Abnormal; Notable for the following components:      Result Value   APPearance HAZY (*)    Hgb urine dipstick LARGE (*)    Leukocytes,Ua SMALL (*)    Bacteria, UA FEW (*)    All other components within normal limits  URINE CULTURE    EKG   Radiology No results found.  Procedures Procedures (including critical care time)  Medications Ordered in UC Medications - No data to display  Initial Impression / Assessment and Plan / UC Course  I have reviewed the triage vital signs and the nursing notes.  Pertinent labs & imaging results that were available during my care of the patient were reviewed by me and considered in my medical decision making (see chart for details).    45 year old female presents with UTI. Sending culture. Placing on macrobid. Yeast noted in urine as well. Diflucan as prescribed.  Final Clinical Impressions(s) / UC Diagnoses   Final diagnoses:  Acute cystitis without hematuria  Yeast infection     Discharge Instructions     Medication as prescribed.  Take care  Dr. Lacinda Axon     ED Prescriptions    Medication Sig Dispense Auth. Provider   nitrofurantoin, macrocrystal-monohydrate, (MACROBID) 100 MG capsule Take 1 capsule (100 mg total) by mouth 2 (two) times daily. 14 capsule Jossue Rubenstein G, DO   fluconazole (DIFLUCAN) 150 MG tablet Take 1 tablet (150 mg total) by mouth once for 1 dose. Repeat dose in 72 hours. 2 tablet Coral Spikes, DO     PDMP not reviewed this encounter.   Coral Spikes, [place] 04/27/20 [phone]

## 2020-04-29 LAB — URINE CULTURE: Culture: 20000 — AB

## 2020-05-05 ENCOUNTER — Other Ambulatory Visit: Payer: Self-pay | Admitting: Psychiatry

## 2020-06-09 ENCOUNTER — Other Ambulatory Visit: Payer: Self-pay

## 2020-06-09 ENCOUNTER — Encounter: Payer: Self-pay | Admitting: Psychiatry

## 2020-06-09 ENCOUNTER — Telehealth (INDEPENDENT_AMBULATORY_CARE_PROVIDER_SITE_OTHER): Payer: Medicare Other | Admitting: Psychiatry

## 2020-06-09 DIAGNOSIS — F4310 Post-traumatic stress disorder, unspecified: Secondary | ICD-10-CM

## 2020-06-09 DIAGNOSIS — F3341 Major depressive disorder, recurrent, in partial remission: Secondary | ICD-10-CM | POA: Insufficient documentation

## 2020-06-09 DIAGNOSIS — F5101 Primary insomnia: Secondary | ICD-10-CM | POA: Diagnosis not present

## 2020-06-09 DIAGNOSIS — F410 Panic disorder [episodic paroxysmal anxiety] without agoraphobia: Secondary | ICD-10-CM | POA: Diagnosis not present

## 2020-06-09 DIAGNOSIS — F3342 Major depressive disorder, recurrent, in full remission: Secondary | ICD-10-CM | POA: Diagnosis not present

## 2020-06-09 MED ORDER — BUPROPION HCL ER (XL) 150 MG PO TB24: ORAL_TABLET | ORAL | 2 refills | Status: DC

## 2020-06-09 MED ORDER — ZOLPIDEM TARTRATE ER 12.5 MG PO TBCR
EXTENDED_RELEASE_TABLET | ORAL | 2 refills | Status: DC
Start: 2020-06-09 — End: 2020-10-02

## 2020-06-09 MED ORDER — HYDROXYZINE HCL 25 MG PO TABS: ORAL_TABLET | ORAL | 2 refills | Status: DC

## 2020-06-09 MED ORDER — VIIBRYD 40 MG PO TABS: ORAL_TABLET | ORAL | 2 refills | Status: DC

## 2020-06-09 MED ORDER — LAMOTRIGINE 200 MG PO TABS: 100.0000 mg | ORAL_TABLET | Freq: Two times a day (BID) | ORAL | 0 refills | Status: DC

## 2020-06-09 NOTE — Progress Notes (Signed)
Virtual Visit via Video Note  I connected with [name] on 06/09/20 at  9:00 AM EDT by a video enabled telemedicine application and verified that I am speaking with the correct person using two identifiers.  Location Provider Location : [facility] Patient Location : Home  Participants: Patient , Provider   I discussed the limitations of evaluation and management by telemedicine and the availability of in person appointments. The patient expressed understanding and agreed to proceed.  I discussed the assessment and treatment plan with the patient. The patient was provided an opportunity to ask questions and all were answered. The patient agreed with the plan and demonstrated an understanding of the instructions.   The patient was advised to call back or seek an in-person evaluation if the symptoms worsen or if the condition fails to improve as anticipated.   [place] MD OP Progress Note  06/09/2020 3:28 PM [name]  MRN:  [id number]  Chief Complaint:  Chief Complaint    Follow-up; Depression; Fatigue     HPI: [name] is a 45 year old Caucasian female, divorced, lives in [place], has a history of MDD, panic attack, PTSD, insomnia was evaluated by telemedicine today.  Patient today reports she continues to feel tired and fatigued during the day.  She reports she had follow-up with her oncologist who manages her anemia and infusions [facility] recently.  She however was told her iron levels are higher than where it should be.   She does continue to have back pain on a regular basis.  Patient reports sleep is overall okay.  She denies any significant depression or anxiety.  She is coping with her anxiety symptoms better.  She denies any suicidality, homicidality or perceptual disturbances.  Patient denies any other concerns today.  Visit Diagnosis:    ICD-10-CM   1. MDD (major depressive disorder), recurrent, in full remission ([name])  F33.42  lamoTRIgine (LAMICTAL) 200 MG tablet  2. Panic disorder  F41.0 Vilazodone HCl (VIIBRYD) 40 MG TABS    zolpidem (AMBIEN CR) 12.5 MG CR tablet    hydrOXYzine (ATARAX/VISTARIL) 25 MG tablet  3. PTSD (post-traumatic stress disorder)  F43.10 Vilazodone HCl (VIIBRYD) 40 MG TABS    buPROPion (WELLBUTRIN XL) 150 MG 24 hr tablet    zolpidem (AMBIEN CR) 12.5 MG CR tablet  4. Primary insomnia  F51.01     Past Psychiatric History: I have reviewed past psychiatric history from progress note on 05/09/2017  Past Medical History:  Past Medical History:  Diagnosis Date  . Abnormal ECG   . Anemia   . Anxiety   . Arthritis   . B12 deficiency   . Cardiomyopathy ([place])   . Chronic abdominal pain   . Complication of anesthesia    difficulty to get sedated during endoscopy  . COVID-19 01/18/2019  . DDD (degenerative disc disease), lumbar   . Depression   . Diabetes mellitus without complication ([place])   . Dysrhythmia   . GERD (gastroesophageal reflux disease)   . Headache   . Iron deficiency   . Neuropathy   . Obesity   . Ovarian cyst   . Pneumonia    within past five years  . PTSD (post-traumatic stress disorder)   . PTSD (post-traumatic stress disorder)   . Rh negative status during pregnancy   . Seizures ([place])   . Small bowel obstruction [facility])     Past Surgical History:  Procedure Laterality Date  . ABDOMINAL HYSTERECTOMY    .  BILATERAL SALPINGECTOMY Bilateral 09/29/2015   Procedure: BILATERAL SALPINGECTOMY;  Surgeon: Benjaman Kindler, MD;  Location: [facility] ORS;  Service: Gynecology;  Laterality: Bilateral;  . bowel obstruction  2011  . CHOLECYSTECTOMY    . COLONOSCOPY WITH PROPOFOL N/A 01/24/2018   Procedure: COLONOSCOPY WITH PROPOFOL;  Surgeon: Toledo, Benay Pike, MD;  Location: [facility] ENDOSCOPY;  Service: Gastroenterology;  Laterality: N/A;  . DILATION AND CURETTAGE OF UTERUS    . ESOPHAGOGASTRODUODENOSCOPY    . ESOPHAGOGASTRODUODENOSCOPY (EGD) WITH PROPOFOL N/A 01/24/2018   Procedure:  ESOPHAGOGASTRODUODENOSCOPY (EGD) WITH PROPOFOL;  Surgeon: Toledo, Benay Pike, MD;  Location: [facility] ENDOSCOPY;  Service: Gastroenterology;  Laterality: N/A;  . GASTRIC BYPASS    . gi bleed  2009   Surgery-was in ICU for three weeks  . HERNIA REPAIR    . OVARIAN CYST REMOVAL Left 09/29/2015   Procedure: OVARIAN CYSTECTOMY;  Surgeon: Benjaman Kindler, MD;  Location: [facility] ORS;  Service: Gynecology;  Laterality: Left;  . ROUX-EN-Y PROCEDURE    . VAGINAL HYSTERECTOMY N/A 09/29/2015   Procedure: HYSTERECTOMY VAGINAL;  Surgeon: Benjaman Kindler, MD;  Location: [facility] ORS;  Service: Gynecology;  Laterality: N/A;    Family Psychiatric History: I have reviewed family psychiatric history from progress note on 05/09/2017  Family History:  Family History  Problem Relation Age of Onset  . Arthritis/Rheumatoid Mother   . Heart block Mother   . Clotting disorder Mother   . Osteoporosis Mother   . Heart attack Mother   . Anxiety disorder Mother   . Depression Mother   . Heart disease Father   . Heart attack Father   . Depression Sister   . Anxiety disorder Sister     Social History: Reviewed social history from progress note on 05/09/2017 Social History   Socioeconomic History  . Marital status: Single    Spouse name: Not on file  . Number of children: Not on file  . Years of education: Not on file  . Highest education level: Not on file  Occupational History  . Not on file  Tobacco Use  . Smoking status: Never Smoker  . Smokeless tobacco: Never Used  Vaping Use  . Vaping Use: Never used  Substance and Sexual Activity  . Alcohol use: No    Alcohol/week: 0.0 standard drinks  . Drug use: No  . Sexual activity: Yes    Partners: Male    Birth control/protection: Surgical  Other Topics Concern  . Not on file  Social History Narrative  . Not on file   Social Determinants of Health   Financial Resource Strain: Not on file  Food Insecurity: Not on file  Transportation Needs: Not on file   Physical Activity: Not on file  Stress: Not on file  Social Connections: Not on file    Allergies:  Allergies  Allergen Reactions  . Amoxicillin Anaphylaxis, Hives, Shortness Of Breath, Swelling and Rash    Has patient had a PCN reaction causing immediate rash, facial/tongue/throat swelling, SOB or lightheadedness with hypotension: Yes Has patient had a PCN reaction causing severe rash involving mucus membranes or skin necrosis: Yes Has patient had a PCN reaction that required hospitalization No Has patient had a PCN reaction occurring within the last 10 years: No If all of the above answers are "NO", then may proceed with Cephalosporin use. Other reaction(s): ANAPHYLAXIS Other reaction(s): ANAPHYLAX  . Penicillins Anaphylaxis, Swelling, Rash, Shortness Of Breath and Hives    Has patient had a PCN reaction causing immediate rash, facial/tongue/throat swelling, SOB or  lightheadedness with hypotension: Yes Has patient had a PCN reaction causing severe rash involving mucus membranes or skin necrosis: Yes Has patient had a PCN reaction that required hospitalization No Has patient had a PCN reaction occurring within the last 10 years: No If all of the above answers are "NO", then may proceed with Cephalosporin use.  Other reaction(s): ANAPHYLAXIS Other reaction(s): ANAPHYLAXIS Has patient had a PCN reaction causing immediate rash, facial/tongue/throat swelling, SOB or lightheadedness with hypotension: Yes Has patient had a PCN reaction causing severe rash involving mucus membranes or skin necrosis: Yes Has patient had a PCN reaction that required hospitalization No Has patient had a PCN reaction occurring within the last 10 years: No If all of the above answers are "NO", then may proceed with Cephalosporin use. Other reaction(s): ANAPHYLAXIS Other reaction(s): ANAPHYLAX Has patient had a PCN reaction causing immediate rash, facial/tongue/throat swelling, SOB or lightheadedness with  hypotension: Yes Has patient had a PCN reaction causing severe rash involving mucus membranes or skin necrosis: Yes Has patient had a PCN reaction that required hospitalization No Has patient had a PCN reaction occurring within the last 10 years: No If all of the above answers are "NO", then may proceed with Cephalosporin use. Other reaction(s): ANAPHYLAXIS Other reaction(s): ANAPHYLAXIS   . Morphine Other (See Comments)    Muscle spasms  . Sulfa Antibiotics Nausea Only    Other reaction(s): VOMITING Other reaction(s): VOMITING Other reaction(s): VOMITING    Metabolic Disorder Labs: Lab Results  Component Value Date   HGBA1C 5.5 12/11/2019   MPG 111 12/11/2019   Lab Results  Component Value Date   PROLACTIN 12.1 12/11/2019   Lab Results  Component Value Date   CHOL 162 12/11/2019   TRIG 51 12/11/2019   HDL 87 12/11/2019   CHOLHDL 1.9 12/11/2019   VLDL 10 12/11/2019   LDLCALC 65 12/11/2019   Lab Results  Component Value Date   TSH 1.886 12/11/2019   TSH  05/13/2009    3.163 (NOTE)  Please note change in reference ranges for ages 39W to 6Y. Test methodology is 3rd generation TSH    Therapeutic Level Labs: No results found for: LITHIUM No results found for: VALPROATE No components found for:  CBMZ  Current Medications: Current Outpatient Medications  Medication Sig Dispense Refill  . SUMAtriptan (IMITREX) 100 MG tablet Take 100 mg by mouth every 2 (two) hours as needed for migraine or headache.     [name] acetaminophen (TYLENOL) 325 MG tablet Take 650 mg by mouth every 4 (four) hours as needed for moderate pain.     [name] buPROPion (WELLBUTRIN XL) 150 MG 24 hr tablet TAKE 2 TABLETS(300 MG) BY MOUTH DAILY 60 tablet 2  . cyanocobalamin (,VITAMIN B-12,) 1000 MCG/ML injection Inject 1,000 mcg into the muscle every 30 (thirty) days.     . diphenoxylate-atropine (LOMOTIL) 2.5-0.025 MG tablet     . gabapentin (NEURONTIN) 300 MG capsule Take 300 mg by mouth 2 (two) times daily.     . hydrOXYzine (ATARAX/VISTARIL) 25 MG tablet Take 12.5-25 mg by mouth twice a day as needed for anxiety 60 tablet 2  . lamoTRIgine (LAMICTAL) 200 MG tablet Take 0.5 tablets (100 mg total) by mouth 2 (two) times daily. 90 tablet 0  . lamoTRIgine (LAMICTAL) 25 MG tablet Take by mouth. (Patient not taking: Reported on 06/09/2020)    . loratadine (CLARITIN) 10 MG tablet Take 10 mg by mouth daily.    . metoprolol succinate (TOPROL-XL) 25 MG 24 hr tablet  Take by mouth.    . Multiple Vitamins-Minerals (HM MULTIVITAMIN ADULT GUMMY) CHEW Chew 2 tablets by mouth daily.    . nitrofurantoin, macrocrystal-monohydrate, (MACROBID) 100 MG capsule Take 1 capsule (100 mg total) by mouth 2 (two) times daily. (Patient not taking: Reported on 06/09/2020) 14 capsule 0  . thiamine (VITAMIN B-1) 100 MG tablet Take 100 mg by mouth.    . topiramate (TOPAMAX) 50 MG tablet TAKE 1 TABLET BY MOUTH IN THE MORNING, AND 2 TABLETS EVERY NIGHT AT BEDTIME    . traMADol (ULTRAM) 50 MG tablet Take by mouth.    . Vilazodone HCl (VIIBRYD) 40 MG TABS TAKE 1 TABLET(40 MG) BY MOUTH DAILY 30 tablet 2  . zolpidem (AMBIEN CR) 12.5 MG CR tablet TAKE 1 TABLET(12.5 MG) BY MOUTH AT BEDTIME AS NEEDED FOR SLEEP 30 tablet 2   No current facility-administered medications for this visit.     Musculoskeletal: Strength & Muscle Tone: UTA Gait & Station: UTA Patient leans: N/A  Psychiatric Specialty Exam: Review of Systems  Constitutional: Positive for fatigue.  Musculoskeletal:       Chronic pain - back  Psychiatric/Behavioral: Negative for agitation, behavioral problems, confusion, decreased concentration, dysphoric mood, hallucinations, self-injury, sleep disturbance and suicidal ideas. The patient is not nervous/anxious and is not hyperactive.   All other systems reviewed and are negative.   There were no vitals taken for this visit.There is no height or weight on file to calculate BMI.  General Appearance: Casual  Eye Contact:  Fair   Speech:  Clear and Coherent  Volume:  Normal  Mood:  Euthymic  Affect:  Congruent  Thought Process:  Goal Directed and Descriptions of Associations: Intact  Orientation:  Full (Time, Place, and Person)  Thought Content: Logical   Suicidal Thoughts:  No  Homicidal Thoughts:  No  Memory:  Immediate;   Fair Recent;   Fair Remote;   Fair  Judgement:  Fair  Insight:  Fair  Psychomotor Activity:  Normal  Concentration:  Concentration: Fair and Attention Span: Fair  Recall:  [employer] of Knowledge: Fair  Language: Fair  Akathisia:  No  Handed:  Right  AIMS (if indicated): UTA  Assets:  Communication Skills Desire for Improvement Housing Social Support  ADL's:  Intact  Cognition: WNL  Sleep:  Fair   Screenings: PHQ2-9   Flowsheet Row Video Visit from 06/09/2020 in [place] Video Visit from 04/23/2020 in [place] Video Visit from 04/03/2020 in [place] Video Visit from 03/18/2020 in [place]  PHQ-2 Total Score 2 3 4 6   PHQ-9 Total Score 8 7 12 18     [place] ED from 04/27/2020 in [facility]  Video Visit from 04/03/2020 in [place] Error: Question 6 not populated No Risk       Assessment and Plan: [name] is a 45 year old Caucasian female who has a history of depression, anxiety, insomnia was evaluated by telemedicine today.  Patient is currently making progress with regards to her mood however does report fatigue likely multifactorial.  Discussed plan as noted below.  Plan MDD in remission Lamictal 100 mg p.o. twice daily Wellbutrin XL 300 mg p.o. daily-reduced dosage Viibryd 40 mg p.o. daily Patient was referred for CBT in the past however she has been noncompliant.  PTSD/panic disorder-stable Lamictal as prescribed Viibryd 40 mg p.o. daily Hydroxyzine 25 mg  p.o. nightly as  needed  Insomnia-stable Ambien CR 12.5 mg p.o. nightly Hydroxyzine 25 mg p.o. nightly as needed  Patient does report fatigue likely multifactorial including her pain as well as being on polypharmacy.  Patient advised to take medications that can cause her to be tired at the end of the day or in the afternoon.  Patient will also benefit from continued pain management.  She will continue to follow-up with her primary care provider.  Follow-up in clinic in 2 months or sooner if needed.  This note was generated in part or whole with voice recognition software. Voice recognition is usually quite accurate but there are transcription errors that can and very often do occur. I apologize for any typographical errors that were not detected and corrected.      Ursula Alert, MD 06/09/2020, 3:28 PM

## 2020-08-04 ENCOUNTER — Telehealth (INDEPENDENT_AMBULATORY_CARE_PROVIDER_SITE_OTHER): Payer: Medicare Other | Admitting: Psychiatry

## 2020-08-04 ENCOUNTER — Other Ambulatory Visit: Payer: Self-pay

## 2020-08-04 ENCOUNTER — Encounter: Payer: Self-pay | Admitting: Psychiatry

## 2020-08-04 DIAGNOSIS — F410 Panic disorder [episodic paroxysmal anxiety] without agoraphobia: Secondary | ICD-10-CM | POA: Diagnosis not present

## 2020-08-04 DIAGNOSIS — F3342 Major depressive disorder, recurrent, in full remission: Secondary | ICD-10-CM

## 2020-08-04 DIAGNOSIS — F5101 Primary insomnia: Secondary | ICD-10-CM | POA: Diagnosis not present

## 2020-08-04 DIAGNOSIS — F4310 Post-traumatic stress disorder, unspecified: Secondary | ICD-10-CM

## 2020-08-04 NOTE — Progress Notes (Signed)
Virtual Visit via Video Note  I connected with [name] on 08/04/20 at  9:30 AM EDT by a video enabled telemedicine application and verified that I am speaking with the correct person using two identifiers.  Location Provider Location : [facility] Patient Location : Home  Participants: Patient , Provider   I discussed the limitations of evaluation and management by telemedicine and the availability of in person appointments. The patient expressed understanding and agreed to proceed.    I discussed the assessment and treatment plan with the patient. The patient was provided an opportunity to ask questions and all were answered. The patient agreed with the plan and demonstrated an understanding of the instructions.   The patient was advised to call back or seek an in-person evaluation if the symptoms worsen or if the condition fails to improve as anticipated.                                                                   [facility] MD OP Progress Note  08/04/2020 5:16 PM SAJA BARTOLINI  MRN:  [id number]  Chief Complaint:  Chief Complaint   Follow-up; Depression; Anxiety    HPI: [name] is a 45 year old Caucasian female, divorced, lives in [place], has a history of MDD, panic attacks, PTSD, insomnia was evaluated by telemedicine today.  Patient today reports she is currently doing well with regards to her depression and anxiety.  She does have anxiety symptoms however overall she is managing it okay and it does not affect her much.  Patient reports sleep as good on the Ambien.  She continues to struggle with pain, neuropathy pain as well as back pain and knee pain.  She is currently under the care of pain management and has upcoming appointment.  Patient denies suicidality, homicidality.  She is compliant with medications and denies side effects.  Patient denies any other concerns today.  Visit Diagnosis:    ICD-10-CM   1. MDD (major depressive disorder),  recurrent, in full remission ([place])  F33.42     2. Panic disorder  F41.0     3. PTSD (post-traumatic stress disorder)  F43.10     4. Primary insomnia  F51.01       Past Psychiatric History: I have reviewed past psychiatric history from progress note on 05/09/2017.  Past Medical History:  Past Medical History:  Diagnosis Date   Abnormal ECG    Anemia    Anxiety    Arthritis    B12 deficiency    Cardiomyopathy ([place])    Chronic abdominal pain    Complication of anesthesia    difficulty to get sedated during endoscopy   COVID-19 01/18/2019   DDD (degenerative disc disease), lumbar    Depression    Diabetes mellitus without complication (HCC)    Dysrhythmia    GERD (gastroesophageal reflux disease)    Headache    Iron deficiency    Neuropathy    Obesity    Ovarian cyst    Pneumonia    within past five years   PTSD (post-traumatic stress disorder)    PTSD (post-traumatic stress disorder)    Rh negative status during pregnancy    Seizures ([place])    Small bowel obstruction ([place])  Past Surgical History:  Procedure Laterality Date   ABDOMINAL HYSTERECTOMY     BILATERAL SALPINGECTOMY Bilateral 09/29/2015   Procedure: BILATERAL SALPINGECTOMY;  Surgeon: Benjaman Kindler, MD;  Location: [facility];  Service: Gynecology;  Laterality: Bilateral;   bowel obstruction  2011   CHOLECYSTECTOMY     COLONOSCOPY WITH PROPOFOL N/A 01/24/2018   Procedure: COLONOSCOPY WITH PROPOFOL;  Surgeon: Toledo, Benay Pike, MD;  Location: [facility] END[facility]COPY;  Service: Gastroenterology;  Laterality: N/A;   DILATION AND CURETTAGE OF UTERUS     ESOPHAGOGASTRODUODENOSCOPY     ESOPHAGOGASTRODUODENOSCOPY (EGD) WITH PROPOFOL N/A 01/24/2018   Procedure: ESOPHAGOGASTRODUODENOSCOPY (EGD) WITH PROPOFOL;  Surgeon: Toledo, Benay Pike, MD;  Location: [facility] END[facility]COPY;  Service: Gastroenterology;  Laterality: N/A;   GASTRIC BYPASS     gi bleed  2009   Surgery-was in ICU for three weeks   HERNIA REPAIR     OVARIAN CYST  REMOVAL Left 09/29/2015   Procedure: OVARIAN CYSTECTOMY;  Surgeon: Benjaman Kindler, MD;  Location: [facility] OR[facility];  Service: Gynecology;  Laterality: Left;   ROUX-EN-Y PROCEDURE     VAGINAL HYSTERECTOMY N/A 09/29/2015   Procedure: HYSTERECTOMY VAGINAL;  Surgeon: Benjaman Kindler, MD;  Location: [facility] OR[facility];  Service: Gynecology;  Laterality: N/A;    Family Psychiatric History: I have reviewed family psychiatric history from progress note on 05/09/2017.  Family History:  Family History  Problem Relation Age of Onset   Arthritis/Rheumatoid Mother    Heart block Mother    Clotting disorder Mother    Osteoporosis Mother    Heart attack Mother    Anxiety disorder Mother    Depression Mother    Heart disease Father    Heart attack Father    Depression Sister    Anxiety disorder Sister     Social History: Reviewed social history from progress note on 05/09/2017. Social History   Socioeconomic History   Marital status: Single    Spouse name: Not on file   Number of children: Not on file   Years of education: Not on file   Highest education level: Not on file  Occupational History   Not on file  Tobacco Use   Smoking status: Never   Smokeless tobacco: Never  Vaping Use   Vaping Use: Never used  Substance and Sexual Activity   Alcohol use: No    Alcohol/week: 0.0 standard drinks   Drug use: No   Sexual activity: Yes    Partners: Male    Birth control/protection: Surgical  Other Topics Concern   Not on file  Social History Narrative   Not on file   Social Determinants of Health   Financial Resource Strain: Not on file  Food Insecurity: Not on file  Transportation Needs: Not on file  Physical Activity: Not on file  Stress: Not on file  Social Connections: Not on file    Allergies:  Allergies  Allergen Reactions   Amoxicillin Anaphylaxis, Hives, Shortness Of Breath, Swelling and Rash    Has patient had a PCN reaction causing immediate rash, facial/tongue/throat swelling, SOB  or lightheadedness with hypotension: Yes Has patient had a PCN reaction causing severe rash involving mucus membranes or skin necrosis: Yes Has patient had a PCN reaction that required hospitalization No Has patient had a PCN reaction occurring within the last 10 years: No If all of the above answers are "NO", then may proceed with Cephalosporin use. Other reaction(s): ANAPHYLAXIS Other reaction(s): ANAPHYLAX   Penicillins Anaphylaxis, Swelling, Rash, Shortness Of Breath and Hives  Has patient had a PCN reaction causing immediate rash, facial/tongue/throat swelling, SOB or lightheadedness with hypotension: Yes Has patient had a PCN reaction causing severe rash involving mucus membranes or skin necrosis: Yes Has patient had a PCN reaction that required hospitalization No Has patient had a PCN reaction occurring within the last 10 years: No If all of the above answers are "NO", then may proceed with Cephalosporin use.  Other reaction(s): ANAPHYLAXIS Other reaction(s): ANAPHYLAXIS Has patient had a PCN reaction causing immediate rash, facial/tongue/throat swelling, SOB or lightheadedness with hypotension: Yes Has patient had a PCN reaction causing severe rash involving mucus membranes or skin necrosis: Yes Has patient had a PCN reaction that required hospitalization No Has patient had a PCN reaction occurring within the last 10 years: No If all of the above answers are "NO", then may proceed with Cephalosporin use. Other reaction(s): ANAPHYLAXIS Other reaction(s): ANAPHYLAX Has patient had a PCN reaction causing immediate rash, facial/tongue/throat swelling, SOB or lightheadedness with hypotension: Yes Has patient had a PCN reaction causing severe rash involving mucus membranes or skin necrosis: Yes Has patient had a PCN reaction that required hospitalization No Has patient had a PCN reaction occurring within the last 10 years: No If all of the above answers are "NO", then may proceed with  Cephalosporin use. Other reaction(s): ANAPHYLAXIS Other reaction(s): ANAPHYLAXIS    Morphine Other (See Comments)    Muscle spasms   Sulfa Antibiotics Nausea Only    Other reaction(s): VOMITING Other reaction(s): VOMITING Other reaction(s): VOMITING    Metabolic Disorder Labs: Lab Results  Component Value Date   HGBA1C 5.5 12/11/2019   MPG 111 12/11/2019   Lab Results  Component Value Date   PROLACTIN 12.1 12/11/2019   Lab Results  Component Value Date   CHOL 162 12/11/2019   TRIG 51 12/11/2019   HDL 87 12/11/2019   CHOLHDL 1.9 12/11/2019   VLDL 10 12/11/2019   LDLCALC 65 12/11/2019   Lab Results  Component Value Date   TSH 1.886 12/11/2019   TSH  05/13/2009    3.163 (NOTE) Please note change in reference ranges for ages 106W to 29Y. Test methodology is 3rd generation TSH    Therapeutic Level Labs: No results found for: LITHIUM No results found for: VALPROATE No components found for:  CBMZ  Current Medications: Current Outpatient Medications  Medication Sig Dispense Refill   topiramate (TOPAMAX) 50 MG tablet Take by mouth.     acetaminophen (TYLENOL) 325 MG tablet Take 650 mg by mouth every 4 (four) hours as needed for moderate pain.      buPROPion (WELLBUTRIN XL) 150 MG 24 hr tablet TAKE 2 TABLETS(300 MG) BY MOUTH DAILY 60 tablet 2   cyanocobalamin (,VITAMIN B-12,) 1000 MCG/ML injection Inject 1,000 mcg into the muscle every 30 (thirty) days.      diphenoxylate-atropine (LOMOTIL) 2.5-0.025 MG tablet      gabapentin (NEURONTIN) 300 MG capsule Take 300 mg by mouth 2 (two) times daily.     hydrOXYzine (ATARAX/VISTARIL) 25 MG tablet Take 12.5-25 mg by mouth twice a day as needed for anxiety 60 tablet 2   lamoTRIgine (LAMICTAL) 200 MG tablet Take 0.5 tablets (100 mg total) by mouth 2 (two) times daily. 90 tablet 0   lamoTRIgine (LAMICTAL) 25 MG tablet Take by mouth. (Patient not taking: Reported on 06/09/2020)     loratadine (CLARITIN) 10 MG tablet Take 10 mg by mouth  daily.     metoprolol succinate (TOPROL-XL) 25 MG 24 hr tablet Take  by mouth.     Multiple Vitamins-Minerals (HM MULTIVITAMIN ADULT GUMMY) CHEW Chew 2 tablets by mouth daily.     nitrofurantoin, macrocrystal-monohydrate, (MACROBID) 100 MG capsule Take 1 capsule (100 mg total) by mouth 2 (two) times daily. (Patient not taking: Reported on 06/09/2020) 14 capsule 0   SUMAtriptan (IMITREX) 100 MG tablet Take 100 mg by mouth every 2 (two) hours as needed for migraine or headache.      thiamine (VITAMIN B-1) 100 MG tablet Take 100 mg by mouth.     topiramate (TOPAMAX) 50 MG tablet TAKE 1 TABLET BY MOUTH IN THE MORNING, AND 2 TABLETS EVERY NIGHT AT BEDTIME     traMADol (ULTRAM) 50 MG tablet Take by mouth.     Vilazodone HCl (VIIBRYD) 40 MG TABS TAKE 1 TABLET(40 MG) BY MOUTH DAILY 30 tablet 2   zolpidem (AMBIEN CR) 12.5 MG CR tablet TAKE 1 TABLET(12.5 MG) BY MOUTH AT BEDTIME AS NEEDED FOR SLEEP 30 tablet 2   No current facility-administered medications for this visit.     Musculoskeletal: Strength & Muscle Tone:  UTA Gait & Station:  UTA Patient leans: N/A  Psychiatric Specialty Exam: Review of Systems  Constitutional:  Positive for fatigue.  Musculoskeletal:  Positive for back pain.       Knee pain, BL as well as neuropathy of hands and feet   All other systems reviewed and are negative.  There were no vitals taken for this visit.There is no height or weight on file to calculate BMI.  General Appearance: Fairly Groomed  Eye Contact:  Fair  Speech:  Clear and Coherent  Volume:  Normal  Mood:  Anxious stable  Affect:  Congruent  Thought Process:  Goal Directed and Descriptions of Associations: Intact  Orientation:  Full (Time, Place, and Person)  Thought Content: Logical   Suicidal Thoughts:  No  Homicidal Thoughts:  No  Memory:  Immediate;   Fair Recent;   Fair Remote;   Fair  Judgement:  Fair  Insight:  Fair  Psychomotor Activity:  Normal  Concentration:  Concentration: Fair and  Attention Span: Fair  Recall:  [employer] of Knowledge: Fair  Language: Fair  Akathisia:  No  Handed:  Right  AIMS (if indicated): not done  Assets:  Communication Skills Desire for Improvement Housing Social Support  ADL's:  Intact  Cognition: WNL  Sleep:  Fair   Screenings: PHQ2-9    Flowsheet Row Video Visit from 08/04/2020 in [facility] Video Visit from 06/09/2020 in [facility] Video Visit from 04/23/2020 in [facility] [place] Video Visit from 04/03/2020 in [place] Video Visit from 03/18/2020 in [facility]  PHQ-2 Total Score 0 2 3 4 6   PHQ-9 Total Score -- 8 7 12 18       [facility] from 04/27/2020 in [facility]  Video Visit from 04/03/2020 in [place] Error: Question 6 not populated No Risk        Assessment and Plan: [name] is a 45 year old Caucasian female who has a history of depression, anxiety, insomnia was evaluated by telemedicine today.  Patient is currently stable on current medication regimen however does continues to struggle with chronic fatigue as well as chronic pain.  She will continue to need pain management.  Plan MDD in remission Lamictal 100 mg p.o. twice daily Wellbutrin XL 300 mg p.o. daily-reduced dosage. Viibryd  40 mg p.o. daily  PTSD/panic disorder-stable Lamictal as prescribed Viibryd 40 mg p.o. daily Hydroxyzine 25 mg p.o. nightly as needed  Insomnia-stable Ambien CR 12.5 mg p.o. nightly Hydroxyzine 25 mg p.o. nightly as needed  Continue follow-up with primary care provider as well as pain management.  Follow-up in clinic in 2 months or sooner if needed.  This note was generated in part or whole with voice recognition software. Voice recognition is usually quite accurate but there are transcription errors  that can and very often do occur. I apologize for any typographical errors that were not detected and corrected.      Ursula Alert, MD 08/04/2020, 5:16 PM

## 2020-08-29 ENCOUNTER — Inpatient Hospital Stay: Payer: Medicare Other

## 2020-09-01 ENCOUNTER — Inpatient Hospital Stay: Payer: Medicare Other

## 2020-09-01 ENCOUNTER — Other Ambulatory Visit: Payer: Self-pay | Admitting: Psychiatry

## 2020-09-01 ENCOUNTER — Inpatient Hospital Stay: Payer: Medicare Other | Admitting: Oncology

## 2020-09-01 DIAGNOSIS — F410 Panic disorder [episodic paroxysmal anxiety] without agoraphobia: Secondary | ICD-10-CM

## 2020-09-01 DIAGNOSIS — F4310 Post-traumatic stress disorder, unspecified: Secondary | ICD-10-CM

## 2020-09-03 ENCOUNTER — Other Ambulatory Visit: Payer: Self-pay | Admitting: Psychiatry

## 2020-09-03 DIAGNOSIS — F4310 Post-traumatic stress disorder, unspecified: Secondary | ICD-10-CM

## 2020-09-03 DIAGNOSIS — F410 Panic disorder [episodic paroxysmal anxiety] without agoraphobia: Secondary | ICD-10-CM

## 2020-09-26 ENCOUNTER — Inpatient Hospital Stay: Payer: Medicare Other | Attending: Oncology

## 2020-09-26 DIAGNOSIS — Z79899 Other long term (current) drug therapy: Secondary | ICD-10-CM | POA: Diagnosis not present

## 2020-09-26 DIAGNOSIS — Z86018 Personal history of other benign neoplasm: Secondary | ICD-10-CM | POA: Diagnosis not present

## 2020-09-26 DIAGNOSIS — Z9884 Bariatric surgery status: Secondary | ICD-10-CM | POA: Diagnosis not present

## 2020-09-26 DIAGNOSIS — D509 Iron deficiency anemia, unspecified: Secondary | ICD-10-CM | POA: Diagnosis not present

## 2020-09-26 LAB — CBC WITH DIFFERENTIAL/PLATELET
Abs Immature Granulocytes: 0.02 10*3/uL (ref 0.00–0.07)
Basophils Absolute: 0 10*3/uL (ref 0.0–0.1)
Basophils Relative: 1 %
Eosinophils Absolute: 0.1 10*3/uL (ref 0.0–0.5)
Eosinophils Relative: 2 %
HCT: 41 % (ref 36.0–46.0)
Hemoglobin: 13.2 g/dL (ref 12.0–15.0)
Immature Granulocytes: 0 %
Lymphocytes Relative: 27 %
Lymphs Abs: 1.4 10*3/uL (ref 0.7–4.0)
MCH: 30.1 pg (ref 26.0–34.0)
MCHC: 32.2 g/dL (ref 30.0–36.0)
MCV: 93.4 fL (ref 80.0–100.0)
Monocytes Absolute: 0.4 10*3/uL (ref 0.1–1.0)
Monocytes Relative: 8 %
Neutro Abs: 3.1 10*3/uL (ref 1.7–7.7)
Neutrophils Relative %: 62 %
Platelets: 280 10*3/uL (ref 150–400)
RBC: 4.39 MIL/uL (ref 3.87–5.11)
RDW: 12.8 % (ref 11.5–15.5)
WBC: 5 10*3/uL (ref 4.0–10.5)
nRBC: 0 % (ref 0.0–0.2)

## 2020-09-26 LAB — IRON AND TIBC
Iron: 94 ug/dL (ref 28–170)
Saturation Ratios: 25 % (ref 10.4–31.8)
TIBC: 372 ug/dL (ref 250–450)
UIBC: 278 ug/dL

## 2020-09-26 LAB — FERRITIN: Ferritin: 42 ng/mL (ref 11–307)

## 2020-09-29 ENCOUNTER — Telehealth: Payer: Self-pay

## 2020-09-29 ENCOUNTER — Inpatient Hospital Stay (HOSPITAL_BASED_OUTPATIENT_CLINIC_OR_DEPARTMENT_OTHER): Payer: Medicare Other | Admitting: Oncology

## 2020-09-29 ENCOUNTER — Encounter: Payer: Self-pay | Admitting: Oncology

## 2020-09-29 ENCOUNTER — Other Ambulatory Visit: Payer: Self-pay | Admitting: Psychiatry

## 2020-09-29 DIAGNOSIS — D508 Other iron deficiency anemias: Secondary | ICD-10-CM | POA: Diagnosis not present

## 2020-09-29 DIAGNOSIS — F3342 Major depressive disorder, recurrent, in full remission: Secondary | ICD-10-CM

## 2020-09-29 MED ORDER — LAMOTRIGINE 25 MG PO TABS: 25.0000 mg | ORAL_TABLET | Freq: Every day | ORAL | 0 refills | Status: DC

## 2020-09-29 NOTE — Telephone Encounter (Signed)
Ordered for a month.

## 2020-09-29 NOTE — Telephone Encounter (Signed)
pt called left a message that dr. Thurnell Lose told her to take lamotrigine '25mg'$  in am and '200mg'$  in the pm. pt states that she is just now running out of the '25mg'$  so she needs a new rx sent to pharmacy.

## 2020-09-29 NOTE — Progress Notes (Signed)
Patient states no concerns at the moment. 

## 2020-09-29 NOTE — Progress Notes (Signed)
[facility]  [phone] [phone]) [phone]  ID: RAINEY ROBIES OB: 1975/11/03  MR#: [id number]  [id number]  Patient Care Team: Leonard Downing, MD as PCP - General (Family Medicine)  CHIEF COMPLAINT: Iron deficiency anemia.  I connected with DESEREA EIDSON on 09/29/20 at  9:30 AM EDT by video enabled telemedicine visit and verified that I am speaking with the correct person using two identifiers.   I discussed the limitations, risks, security and privacy concerns of performing an evaluation and management service by telemedicine and the availability of in-person appointments. I also discussed with the patient that there may be a patient responsible charge related to this service. The patient expressed understanding and agreed to proceed.   Other persons participating in the visit and their role in the encounter: NP and Patient    Patient's location: Home  Provider's location: Clinic    Chief Complaint: follow-up     INTERVAL HISTORY: Mrs. Lu Duffel is a 45 year old female with past medical history significant for cardiomyopathy, neuropathy, uterine fibroid with abnormal bleeding s/p histerectomy (2017), dizziness, bariatric surgery (2009), obesity, anxiety who is followed by Dr. Grayland Ormond for iron deficiency anemia.  She was last seen in November 2021 and received 2 doses of IV Feraheme on 12/21/2019 and 12/28/2019.  In the interim she was treated for an ecoli UTI at urgent care with Macrobid with prophylactic Diflucan on 04/27/2020. Symptoms resolved.   Today, she reports no new symptoms.  REVIEW OF SYSTEMS:   Review of Systems  Constitutional:  Positive for malaise/fatigue. Negative for fever and weight loss.  Respiratory: Negative.  Negative for cough and shortness of breath.   Cardiovascular: Negative.  Negative for chest pain and leg swelling.  Gastrointestinal: Negative.  Negative for abdominal pain, blood in stool and melena.   Genitourinary: Negative.  Negative for hematuria.  Musculoskeletal: Negative.  Negative for back pain, joint pain and neck pain.  Skin: Negative.  Negative for rash.  Neurological:  Positive for weakness. Negative for dizziness.  Psychiatric/Behavioral: Negative.  Negative for depression. The patient is not nervous/anxious.    As per HPI. Otherwise, a complete review of systems is negative.  PAST MEDICAL HISTORY: Past Medical History:  Diagnosis Date   Abnormal ECG    Anemia    Anxiety    Arthritis    B12 deficiency    Cardiomyopathy ([place])    Chronic abdominal pain    Complication of anesthesia    difficulty to get sedated during endoscopy   COVID-19 01/18/2019   DDD (degenerative disc disease), lumbar    Depression    Diabetes mellitus without complication (HCC)    Dysrhythmia    GERD (gastroesophageal reflux disease)    Headache    Iron deficiency    Neuropathy    Obesity    Ovarian cyst    Pneumonia    within past five years   PTSD (post-traumatic stress disorder)    PTSD (post-traumatic stress disorder)    Rh negative status during pregnancy    Seizures ([place])    Small bowel obstruction ([place])     PAST SURGICAL HISTORY: Past Surgical History:  Procedure Laterality Date   ABDOMINAL HYSTERECTOMY     BILATERAL SALPINGECTOMY Bilateral 09/29/2015   Procedure: BILATERAL SALPINGECTOMY;  Surgeon: Benjaman Kindler, MD;  Location: [facility];  Service: Gynecology;  Laterality: Bilateral;   bowel obstruction  2011   CHOLECYSTECTOMY     COLONOSCOPY WITH PROPOFOL N/A 01/24/2018   Procedure: COLONOSCOPY WITH PROPOFOL;  Surgeon: Toledo, Benay Pike, MD;  Location: [facility];  Service: Gastroenterology;  Laterality: N/A;   DILATION AND CURETTAGE OF UTERUS     ESOPHAGOGASTRODUODENOSCOPY     ESOPHAGOGASTRODUODENOSCOPY (EGD) WITH PROPOFOL N/A 01/24/2018   Procedure: ESOPHAGOGASTRODUODENOSCOPY (EGD) WITH PROPOFOL;  Surgeon: Toledo, Benay Pike, MD;  Location: [facility];   Service: Gastroenterology;  Laterality: N/A;   GASTRIC BYPASS     gi bleed  2009   Surgery-was in ICU for three weeks   HERNIA REPAIR     OVARIAN CYST REMOVAL Left 09/29/2015   Procedure: OVARIAN CYSTECTOMY;  Surgeon: Benjaman Kindler, MD;  Location: [facility];  Service: Gynecology;  Laterality: Left;   ROUX-EN-Y PROCEDURE     VAGINAL HYSTERECTOMY N/A 09/29/2015   Procedure: HYSTERECTOMY VAGINAL;  Surgeon: Benjaman Kindler, MD;  Location: [facility];  Service: Gynecology;  Laterality: N/A;    FAMILY HISTORY Family History  Problem Relation Age of Onset   Arthritis/Rheumatoid Mother    Heart block Mother    Clotting disorder Mother    Osteoporosis Mother    Heart attack Mother    Anxiety disorder Mother    Depression Mother    Heart disease Father    Heart attack Father    Depression Sister    Anxiety disorder Sister        ADVANCED DIRECTIVES:    HEALTH MAINTENANCE: Social History   Tobacco Use   Smoking status: Never   Smokeless tobacco: Never  Vaping Use   Vaping Use: Never used  Substance Use Topics   Alcohol use: No    Alcohol/week: 0.0 standard drinks   Drug use: No     Colonoscopy:  PAP:  Bone density:  Lipid panel:  Allergies  Allergen Reactions   Amoxicillin Anaphylaxis, Hives, Shortness Of Breath, Swelling and Rash    Has patient had a PCN reaction causing immediate rash, facial/tongue/throat swelling, SOB or lightheadedness with hypotension: Yes Has patient had a PCN reaction causing severe rash involving mucus membranes or skin necrosis: Yes Has patient had a PCN reaction that required hospitalization No Has patient had a PCN reaction occurring within the last 10 years: No If all of the above answers are "NO", then may proceed with Cephalosporin use. Other reaction(s): ANAPHYLAXIS Other reaction(s): ANAPHYLAX   Penicillins Anaphylaxis, Swelling, Rash, Shortness Of Breath and Hives    Has patient had a PCN reaction causing immediate rash,  facial/tongue/throat swelling, SOB or lightheadedness with hypotension: Yes Has patient had a PCN reaction causing severe rash involving mucus membranes or skin necrosis: Yes Has patient had a PCN reaction that required hospitalization No Has patient had a PCN reaction occurring within the last 10 years: No If all of the above answers are "NO", then may proceed with Cephalosporin use.  Other reaction(s): ANAPHYLAXIS Other reaction(s): ANAPHYLAXIS Has patient had a PCN reaction causing immediate rash, facial/tongue/throat swelling, SOB or lightheadedness with hypotension: Yes Has patient had a PCN reaction causing severe rash involving mucus membranes or skin necrosis: Yes Has patient had a PCN reaction that required hospitalization No Has patient had a PCN reaction occurring within the last 10 years: No If all of the above answers are "NO", then may proceed with Cephalosporin use. Other reaction(s): ANAPHYLAXIS Other reaction(s): ANAPHYLAX Has patient had a PCN reaction causing immediate rash, facial/tongue/throat swelling, SOB or lightheadedness with hypotension: Yes Has patient had a PCN reaction causing severe rash involving mucus membranes or skin necrosis: Yes Has patient had a PCN reaction that required hospitalization No Has  patient had a PCN reaction occurring within the last 10 years: No If all of the above answers are "NO", then may proceed with Cephalosporin use. Other reaction(s): ANAPHYLAXIS Other reaction(s): ANAPHYLAXIS    Morphine Other (See Comments)    Muscle spasms   Sulfa Antibiotics Nausea Only    Other reaction(s): VOMITING Other reaction(s): VOMITING Other reaction(s): VOMITING    Current Outpatient Medications  Medication Sig Dispense Refill   acetaminophen (TYLENOL) 325 MG tablet Take 650 mg by mouth every 4 (four) hours as needed for moderate pain.      buPROPion (WELLBUTRIN XL) 150 MG 24 hr tablet TAKE 2 TABLETS(300 MG) BY MOUTH DAILY 60 tablet 2    cyanocobalamin (,VITAMIN B-12,) 1000 MCG/ML injection Inject 1,000 mcg into the muscle every 30 (thirty) days.      diphenoxylate-atropine (LOMOTIL) 2.5-0.025 MG tablet      gabapentin (NEURONTIN) 300 MG capsule Take 300 mg by mouth 2 (two) times daily.     hydrOXYzine (ATARAX/VISTARIL) 25 MG tablet TAKE 1/2 TO 1 TABLET BY MOUTH TWICE DAILY AS NEEDED FOR ANXIETY 60 tablet 2   lamoTRIgine (LAMICTAL) 200 MG tablet Take 0.5 tablets (100 mg total) by mouth 2 (two) times daily. 90 tablet 0   lamoTRIgine (LAMICTAL) 25 MG tablet Take by mouth.     loratadine (CLARITIN) 10 MG tablet Take 10 mg by mouth daily.     Multiple Vitamins-Minerals (HM MULTIVITAMIN ADULT GUMMY) CHEW Chew 2 tablets by mouth daily.     SUMAtriptan (IMITREX) 100 MG tablet Take 100 mg by mouth every 2 (two) hours as needed for migraine or headache.      thiamine (VITAMIN B-1) 100 MG tablet Take 100 mg by mouth.     topiramate (TOPAMAX) 50 MG tablet TAKE 1 TABLET BY MOUTH IN THE MORNING, AND 2 TABLETS EVERY NIGHT AT BEDTIME     topiramate (TOPAMAX) 50 MG tablet Take by mouth.     Vilazodone HCl (VIIBRYD) 40 MG TABS TAKE 1 TABLET(40 MG) BY MOUTH DAILY 30 tablet 2   zolpidem (AMBIEN CR) 12.5 MG CR tablet TAKE 1 TABLET(12.5 MG) BY MOUTH AT BEDTIME AS NEEDED FOR SLEEP 30 tablet 2   metoprolol succinate (TOPROL-XL) 25 MG 24 hr tablet Take by mouth. (Patient not taking: Reported on 09/29/2020)     nitrofurantoin, macrocrystal-monohydrate, (MACROBID) 100 MG capsule Take 1 capsule (100 mg total) by mouth 2 (two) times daily. (Patient not taking: No sig reported) 14 capsule 0   traMADol (ULTRAM) 50 MG tablet Take by mouth. (Patient not taking: Reported on 09/29/2020)     No current facility-administered medications for this visit.    OBJECTIVE: There were no vitals filed for this visit.    There is no height or weight on file to calculate BMI.    ECOG FS:0 - Asymptomatic  Physical Exam Constitutional:      Appearance: Normal appearance.   Neurological:     Mental Status: She is alert and oriented to person, place, and time.  Psychiatric:        Mood and Affect: Mood normal.        Behavior: Behavior normal.     LAB RESULTS:  Lab Results  Component Value Date   NA 136 04/26/2018   K 3.5 04/26/2018   CL 108 04/26/2018   CO2 20 (L) 04/26/2018   GLUCOSE 108 (H) 04/26/2018   BUN 9 04/26/2018   CREATININE 0.57 04/26/2018   CALCIUM 8.7 (L) 04/26/2018   PROT 7.4 04/26/2018  ALBUMIN 4.1 04/26/2018   AST 17 04/26/2018   ALT 18 04/26/2018   ALKPHOS 32 (L) 04/26/2018   BILITOT 0.9 04/26/2018   GFRNONAA >60 04/26/2018   GFRAA >60 04/26/2018    Lab Results  Component Value Date   WBC 5.0 09/26/2020   NEUTROABS 3.1 09/26/2020   HGB 13.2 09/26/2020   HCT 41.0 09/26/2020   MCV 93.4 09/26/2020   PLT 280 09/26/2020   Lab Results  Component Value Date   FERRITIN 42 09/26/2020   Lab Results  Component Value Date   IRON 94 09/26/2020   TIBC 372 09/26/2020   IRONPCTSAT 25 09/26/2020      STUDIES: No results found.  ASSESSMENT: Iron deficiency anemia.  PLAN: Your good Ms. Gallbladder something crazy like that like you know when he was like deep in his  1. Iron deficiency anemia:  Patient has been iron deficient ever since her gastric bypass surgery.  She also has history of uterine fibroids with abnormal uterine bleeding.  Denies any additional bleeding at this time.  She last received IV Feraheme in November 2021.  She is unable to tolerate oral iron.  Labs from 09/26/2020 show a ferritin of 42 with iron saturation of 25%.  Hemoglobin is normal at 13.2.  No additional IV iron needed at this time.  Plan is to continue every 4 to 53-monthfollow-up with lab work and possible IV iron.  Disposition- RTC in 4 months for repeat lab work (CBC, ferritin, iron panel), MD assessment plus or minus IV iron.  I spent 20 minutes dedicated to the care of this patient (face-to-face and non-face-to-face) on the date of the  encounter to include what is described in the assessment and plan.  Patient expressed understanding and was in agreement with this plan. She also understands that She can call clinic at any time with any questions, concerns, or complaints.     JJacquelin Hawking NP   09/29/2020 9:14 AM

## 2020-09-30 ENCOUNTER — Inpatient Hospital Stay: Payer: Medicare Other

## 2020-10-02 ENCOUNTER — Other Ambulatory Visit: Payer: Self-pay | Admitting: Psychiatry

## 2020-10-02 DIAGNOSIS — F410 Panic disorder [episodic paroxysmal anxiety] without agoraphobia: Secondary | ICD-10-CM

## 2020-10-02 DIAGNOSIS — F4310 Post-traumatic stress disorder, unspecified: Secondary | ICD-10-CM

## 2020-10-11 ENCOUNTER — Other Ambulatory Visit: Payer: Self-pay | Admitting: Psychiatry

## 2020-10-11 DIAGNOSIS — F410 Panic disorder [episodic paroxysmal anxiety] without agoraphobia: Secondary | ICD-10-CM

## 2020-10-11 DIAGNOSIS — F4310 Post-traumatic stress disorder, unspecified: Secondary | ICD-10-CM

## 2020-10-15 ENCOUNTER — Telehealth (INDEPENDENT_AMBULATORY_CARE_PROVIDER_SITE_OTHER): Payer: Medicare Other | Admitting: Psychiatry

## 2020-10-15 ENCOUNTER — Encounter: Payer: Self-pay | Admitting: Psychiatry

## 2020-10-15 ENCOUNTER — Other Ambulatory Visit: Payer: Self-pay

## 2020-10-15 DIAGNOSIS — F5101 Primary insomnia: Secondary | ICD-10-CM

## 2020-10-15 DIAGNOSIS — F4310 Post-traumatic stress disorder, unspecified: Secondary | ICD-10-CM

## 2020-10-15 DIAGNOSIS — F3342 Major depressive disorder, recurrent, in full remission: Secondary | ICD-10-CM

## 2020-10-15 DIAGNOSIS — F410 Panic disorder [episodic paroxysmal anxiety] without agoraphobia: Secondary | ICD-10-CM

## 2020-10-15 MED ORDER — ZOLPIDEM TARTRATE ER 12.5 MG PO TBCR: EXTENDED_RELEASE_TABLET | ORAL | 2 refills | Status: DC

## 2020-10-15 MED ORDER — LAMOTRIGINE 25 MG PO TABS: 25.0000 mg | ORAL_TABLET | Freq: Every day | ORAL | 0 refills | Status: DC

## 2020-10-15 NOTE — Progress Notes (Signed)
Virtual Visit via Video Note  I connected with [name] on 10/15/20 at 10:00 AM EDT by a video enabled telemedicine application and verified that I am speaking with the correct person using two identifiers.  Location Provider Location : [facility] Patient Location : Car  Participants: Patient , Provider    I discussed the limitations of evaluation and management by telemedicine and the availability of in person appointments. The patient expressed understanding and agreed to proceed.   I discussed the assessment and treatment plan with the patient. The patient was provided an opportunity to ask questions and all were answered. The patient agreed with the plan and demonstrated an understanding of the instructions.   The patient was advised to call back or seek an in-person evaluation if the symptoms worsen or if the condition fails to improve as anticipated.   [facility] MD OP Progress Note  10/15/2020 10:12 AM [name]  MRN:  [id number]  Chief Complaint:  Chief Complaint   Follow-up; Anxiety; Depression    HPI: [name] is a 45 year old Caucasian female, divorced, lives in [place], has a history of MDD, panic attacks, PTSD, insomnia was evaluated by telemedicine today.  Patient today reports overall she is doing well with regards to her mood.  She does have anxiety symptoms when she has certain stressors however she has learned to cope with it.  She also uses hydroxyzine as needed which has been helpful.  Reports sleep is good on the Ambien.  She continues to have pain however reports overall she is managing it okay.  Continues to follow-up with her providers.  Reports appetite is fair.  Patient denies any suicidality, homicidality or perceptual disturbances.  She reports her daughter is currently on academic break and they are spending time together.  Her daughter is currently in first grade.  Patient continues to be not wanting to pursue psychotherapy  sessions.  She reports overall she is doing well and is not interested in psychotherapy at this time.  Patient denies any other concerns today.  Visit Diagnosis:    ICD-10-CM   1. MDD (major depressive disorder), recurrent, in full remission ([place])  F33.42 lamoTRIgine (LAMICTAL) 25 MG tablet    2. Panic disorder  F41.0 zolpidem (AMBIEN CR) 12.5 MG CR tablet    3. PTSD (post-traumatic stress disorder)  F43.10 zolpidem (AMBIEN CR) 12.5 MG CR tablet    lamoTRIgine (LAMICTAL) 25 MG tablet    4. Primary insomnia  F51.01       Past Psychiatric History: I have reviewed past psychiatric history from progress note on 05/09/2017.  Past Medical History:  Past Medical History:  Diagnosis Date   Abnormal ECG    Anemia    Anxiety    Arthritis    B12 deficiency    Cardiomyopathy ([place])    Chronic abdominal pain    Complication of anesthesia    difficulty to get sedated during endoscopy   COVID-19 01/18/2019   DDD (degenerative disc disease), lumbar    Depression    Diabetes mellitus without complication (HCC)    Dysrhythmia    GERD (gastroesophageal reflux disease)    Headache    Iron deficiency    Neuropathy    Obesity    Ovarian cyst    Pneumonia    within past five years   PTSD (post-traumatic stress disorder)    PTSD (post-traumatic stress disorder)    Rh negative status during pregnancy    Seizures ([place])    Small  bowel obstruction [facility])     Past Surgical History:  Procedure Laterality Date   ABDOMINAL HYSTERECTOMY     BILATERAL SALPINGECTOMY Bilateral 09/29/2015   Procedure: BILATERAL SALPINGECTOMY;  Surgeon: Benjaman Kindler, MD;  Location: [facility] ORS;  Service: Gynecology;  Laterality: Bilateral;   bowel obstruction  2011   CHOLECYSTECTOMY     COLONOSCOPY WITH PROPOFOL N/A 01/24/2018   Procedure: COLONOSCOPY WITH PROPOFOL;  Surgeon: Toledo, Benay Pike, MD;  Location: [facility] ENDOSCOPY;  Service: Gastroenterology;  Laterality: N/A;   DILATION AND CURETTAGE OF UTERUS      ESOPHAGOGASTRODUODENOSCOPY     ESOPHAGOGASTRODUODENOSCOPY (EGD) WITH PROPOFOL N/A 01/24/2018   Procedure: ESOPHAGOGASTRODUODENOSCOPY (EGD) WITH PROPOFOL;  Surgeon: Toledo, Benay Pike, MD;  Location: [facility] ENDOSCOPY;  Service: Gastroenterology;  Laterality: N/A;   GASTRIC BYPASS     gi bleed  2009   Surgery-was in ICU for three weeks   HERNIA REPAIR     OVARIAN CYST REMOVAL Left 09/29/2015   Procedure: OVARIAN CYSTECTOMY;  Surgeon: Benjaman Kindler, MD;  Location: [facility] ORS;  Service: Gynecology;  Laterality: Left;   ROUX-EN-Y PROCEDURE     VAGINAL HYSTERECTOMY N/A 09/29/2015   Procedure: HYSTERECTOMY VAGINAL;  Surgeon: Benjaman Kindler, MD;  Location: [facility] ORS;  Service: Gynecology;  Laterality: N/A;    Family Psychiatric History: Reviewed family psychiatric history from progress note on 05/09/2017.  Family History:  Family History  Problem Relation Age of Onset   Arthritis/Rheumatoid Mother    Heart block Mother    Clotting disorder Mother    Osteoporosis Mother    Heart attack Mother    Anxiety disorder Mother    Depression Mother    Heart disease Father    Heart attack Father    Depression Sister    Anxiety disorder Sister     Social History: Reviewed social history from progress note on 05/09/2017. Social History   Socioeconomic History   Marital status: Single    Spouse name: Not on file   Number of children: Not on file   Years of education: Not on file   Highest education level: Not on file  Occupational History   Not on file  Tobacco Use   Smoking status: Never   Smokeless tobacco: Never  Vaping Use   Vaping Use: Never used  Substance and Sexual Activity   Alcohol use: No    Alcohol/week: 0.0 standard drinks   Drug use: No   Sexual activity: Yes    Partners: Male    Birth control/protection: Surgical  Other Topics Concern   Not on file  Social History Narrative   Not on file   Social Determinants of Health   Financial Resource Strain: Not on file  Food  Insecurity: Not on file  Transportation Needs: Not on file  Physical Activity: Not on file  Stress: Not on file  Social Connections: Not on file    Allergies:  Allergies  Allergen Reactions   Amoxicillin Anaphylaxis, Hives, Shortness Of Breath, Swelling and Rash    Has patient had a PCN reaction causing immediate rash, facial/tongue/throat swelling, SOB or lightheadedness with hypotension: Yes Has patient had a PCN reaction causing severe rash involving mucus membranes or skin necrosis: Yes Has patient had a PCN reaction that required hospitalization No Has patient had a PCN reaction occurring within the last 10 years: No If all of the above answers are "NO", then may proceed with Cephalosporin use. Other reaction(s): ANAPHYLAXIS Other reaction(s): ANAPHYLAX   Penicillins Anaphylaxis, Swelling, Rash, Shortness Of Breath  and Hives    Has patient had a PCN reaction causing immediate rash, facial/tongue/throat swelling, SOB or lightheadedness with hypotension: Yes Has patient had a PCN reaction causing severe rash involving mucus membranes or skin necrosis: Yes Has patient had a PCN reaction that required hospitalization No Has patient had a PCN reaction occurring within the last 10 years: No If all of the above answers are "NO", then may proceed with Cephalosporin use.  Other reaction(s): ANAPHYLAXIS Other reaction(s): ANAPHYLAXIS Has patient had a PCN reaction causing immediate rash, facial/tongue/throat swelling, SOB or lightheadedness with hypotension: Yes Has patient had a PCN reaction causing severe rash involving mucus membranes or skin necrosis: Yes Has patient had a PCN reaction that required hospitalization No Has patient had a PCN reaction occurring within the last 10 years: No If all of the above answers are "NO", then may proceed with Cephalosporin use. Other reaction(s): ANAPHYLAXIS Other reaction(s): ANAPHYLAX Has patient had a PCN reaction causing immediate rash,  facial/tongue/throat swelling, SOB or lightheadedness with hypotension: Yes Has patient had a PCN reaction causing severe rash involving mucus membranes or skin necrosis: Yes Has patient had a PCN reaction that required hospitalization No Has patient had a PCN reaction occurring within the last 10 years: No If all of the above answers are "NO", then may proceed with Cephalosporin use. Other reaction(s): ANAPHYLAXIS Other reaction(s): ANAPHYLAXIS    Morphine Other (See Comments)    Muscle spasms   Sulfa Antibiotics Nausea Only    Other reaction(s): VOMITING Other reaction(s): VOMITING Other reaction(s): VOMITING    Metabolic Disorder Labs: Lab Results  Component Value Date   HGBA1C 5.5 12/11/2019   MPG 111 12/11/2019   Lab Results  Component Value Date   PROLACTIN 12.1 12/11/2019   Lab Results  Component Value Date   CHOL 162 12/11/2019   TRIG 51 12/11/2019   HDL 87 12/11/2019   CHOLHDL 1.9 12/11/2019   VLDL 10 12/11/2019   LDLCALC 65 12/11/2019   Lab Results  Component Value Date   TSH 1.886 12/11/2019   TSH  05/13/2009    3.163 (NOTE) Please note change in reference ranges for ages 49W to 70Y. Test methodology is 3rd generation TSH    Therapeutic Level Labs: No results found for: LITHIUM No results found for: VALPROATE No components found for:  CBMZ  Current Medications: Current Outpatient Medications  Medication Sig Dispense Refill   ergocalciferol (VITAMIN D2) 1.25 MG (50000 UT) capsule Take by mouth.     acetaminophen (TYLENOL) 325 MG tablet Take 650 mg by mouth every 4 (four) hours as needed for moderate pain.      buPROPion (WELLBUTRIN XL) 150 MG 24 hr tablet TAKE 2 TABLETS(300 MG) BY MOUTH DAILY 60 tablet 2   cyanocobalamin (,VITAMIN B-12,) 1000 MCG/ML injection Inject 1,000 mcg into the muscle every 30 (thirty) days.      diphenoxylate-atropine (LOMOTIL) 2.5-0.025 MG tablet      gabapentin (NEURONTIN) 300 MG capsule Take 300 mg by mouth 2 (two) times  daily.     hydrOXYzine (ATARAX/VISTARIL) 25 MG tablet TAKE 1/2 TO 1 TABLET BY MOUTH TWICE DAILY AS NEEDED FOR ANXIETY 60 tablet 2   lamoTRIgine (LAMICTAL) 200 MG tablet TAKE 1/2 TABLET(100 MG) BY MOUTH TWICE DAILY 90 tablet 0   lamoTRIgine (LAMICTAL) 25 MG tablet Take 1 tablet (25 mg total) by mouth daily. 90 tablet 0   loratadine (CLARITIN) 10 MG tablet Take 10 mg by mouth daily.     metoprolol succinate (TOPROL-XL)  25 MG 24 hr tablet Take by mouth. (Patient not taking: Reported on 09/29/2020)     Multiple Vitamins-Minerals (HM MULTIVITAMIN ADULT GUMMY) CHEW Chew 2 tablets by mouth daily.     nitrofurantoin, macrocrystal-monohydrate, (MACROBID) 100 MG capsule Take 1 capsule (100 mg total) by mouth 2 (two) times daily. (Patient not taking: No sig reported) 14 capsule 0   SUMAtriptan (IMITREX) 100 MG tablet Take 100 mg by mouth every 2 (two) hours as needed for migraine or headache.      thiamine (VITAMIN B-1) 100 MG tablet Take 100 mg by mouth.     topiramate (TOPAMAX) 50 MG tablet TAKE 1 TABLET BY MOUTH IN THE MORNING, AND 2 TABLETS EVERY NIGHT AT BEDTIME     topiramate (TOPAMAX) 50 MG tablet Take by mouth.     traMADol (ULTRAM) 50 MG tablet Take by mouth. (Patient not taking: Reported on 09/29/2020)     Vilazodone HCl (VIIBRYD) 40 MG TABS TAKE 1 TABLET(40 MG) BY MOUTH DAILY 30 tablet 2   zolpidem (AMBIEN CR) 12.5 MG CR tablet Take 1 tablet at bedtime 30 tablet 2   No current facility-administered medications for this visit.     Musculoskeletal: Strength & Muscle Tone:  UTA Gait & Station: normal Patient leans: N/A  Psychiatric Specialty Exam: Review of Systems  Psychiatric/Behavioral:  The patient is nervous/anxious.   All other systems reviewed and are negative.  There were no vitals taken for this visit.There is no height or weight on file to calculate BMI.  General Appearance: Casual  Eye Contact:  Fair  Speech:  Clear and Coherent  Volume:  Normal  Mood:  Anxious coping well   Affect:  Appropriate  Thought Process:  Goal Directed and Descriptions of Associations: Intact  Orientation:  Full (Time, Place, and Person)  Thought Content: Logical   Suicidal Thoughts:  No  Homicidal Thoughts:  No  Memory:  Immediate;   Fair Recent;   Fair Remote;   Fair  Judgement:  Fair  Insight:  Fair  Psychomotor Activity:  Normal  Concentration:  Concentration: Fair and Attention Span: Fair  Recall:  [employer] of Knowledge: Fair  Language: Fair  Akathisia:  No  Handed:  Right  AIMS (if indicated): not done  Assets:  Communication Skills Desire for Improvement Housing Social Support  ADL's:  Intact  Cognition: WNL  Sleep:  Fair   Screenings: PHQ2-9    Flowsheet Row Video Visit from 08/04/2020 in [place] Video Visit from 06/09/2020 in [place] Video Visit from 04/23/2020 in [place] Video Visit from 04/03/2020 in [place] Video Visit from 03/18/2020 in [place]  PHQ-2 Total Score 0 '2 3 4 6  '$ PHQ-9 Total Score -- '8 7 12 18      '$ [place] [facility] from 04/27/2020 in [facility]  Video Visit from 04/03/2020 in [place] Error: Question 6 not populated No Risk        Assessment and Plan: [name] is a 45 year old Caucasian female who has a history of depression, anxiety, insomnia was evaluated by telemedicine today.  Patient is currently stable.  Plan as noted below.  Plan MDD in remission Lamotrigine 225 mg p.o. daily in divided dosage Wellbutrin XL 300 mg p.o. daily-reduced dosage Viibryd 40 mg p.o. daily  PTSD/panic disorder-stable Lamictal as prescribed Viibryd 40 mg p.o. daily Hydroxyzine 25 mg p.o.  nightly as needed  Insomnia-stable Ambien CR 12.5 mg p.o. nightly Reviewed [place] PMP AWARE. Hydroxyzine 25 mg p.o. nightly  as needed  Continue pain management.  Follow-up in clinic in 3 months in office.  This note was generated in part or whole with voice recognition software. Voice recognition is usually quite accurate but there are transcription errors that can and very often do occur. I apologize for any typographical errors that were not detected and corrected.     Ursula Alert, MD 10/16/2020, 8:45 AM

## 2020-11-09 ENCOUNTER — Other Ambulatory Visit: Payer: Self-pay | Admitting: Psychiatry

## 2020-11-09 DIAGNOSIS — F410 Panic disorder [episodic paroxysmal anxiety] without agoraphobia: Secondary | ICD-10-CM

## 2020-11-09 DIAGNOSIS — F4310 Post-traumatic stress disorder, unspecified: Secondary | ICD-10-CM

## 2020-11-26 ENCOUNTER — Other Ambulatory Visit: Payer: Self-pay | Admitting: Psychiatry

## 2020-11-26 DIAGNOSIS — F4310 Post-traumatic stress disorder, unspecified: Secondary | ICD-10-CM

## 2020-11-26 DIAGNOSIS — F410 Panic disorder [episodic paroxysmal anxiety] without agoraphobia: Secondary | ICD-10-CM

## 2020-12-26 ENCOUNTER — Telehealth: Payer: Self-pay

## 2020-12-26 DIAGNOSIS — F3342 Major depressive disorder, recurrent, in full remission: Secondary | ICD-10-CM

## 2020-12-26 DIAGNOSIS — F4310 Post-traumatic stress disorder, unspecified: Secondary | ICD-10-CM

## 2020-12-26 MED ORDER — LAMOTRIGINE 25 MG PO TABS
25.0000 mg | ORAL_TABLET | Freq: Every day | ORAL | 0 refills | Status: DC
Start: 2020-12-26 — End: 2021-03-23

## 2020-12-26 MED ORDER — LAMOTRIGINE 200 MG PO TABS: ORAL_TABLET | ORAL | 0 refills | Status: DC

## 2020-12-26 NOTE — Telephone Encounter (Signed)
I have sent Lamictal to pharmacy. °

## 2020-12-26 NOTE — Telephone Encounter (Signed)
received fax requesting refills on the lamotrigine 200mg 

## 2021-01-08 ENCOUNTER — Other Ambulatory Visit: Payer: Self-pay | Admitting: Psychiatry

## 2021-01-08 DIAGNOSIS — F410 Panic disorder [episodic paroxysmal anxiety] without agoraphobia: Secondary | ICD-10-CM

## 2021-01-16 ENCOUNTER — Other Ambulatory Visit: Payer: Self-pay

## 2021-01-16 ENCOUNTER — Encounter: Payer: Self-pay | Admitting: Psychiatry

## 2021-01-16 ENCOUNTER — Ambulatory Visit (INDEPENDENT_AMBULATORY_CARE_PROVIDER_SITE_OTHER): Payer: Medicare Other | Admitting: Psychiatry

## 2021-01-16 VITALS — BP 111/78 | HR 87 | Temp 98.7°F | Wt 149.0 lb

## 2021-01-16 DIAGNOSIS — F3342 Major depressive disorder, recurrent, in full remission: Secondary | ICD-10-CM

## 2021-01-16 DIAGNOSIS — F410 Panic disorder [episodic paroxysmal anxiety] without agoraphobia: Secondary | ICD-10-CM

## 2021-01-16 DIAGNOSIS — F5101 Primary insomnia: Secondary | ICD-10-CM

## 2021-01-16 DIAGNOSIS — F4310 Post-traumatic stress disorder, unspecified: Secondary | ICD-10-CM | POA: Diagnosis not present

## 2021-01-16 MED ORDER — ZOLPIDEM TARTRATE ER 12.5 MG PO TBCR: EXTENDED_RELEASE_TABLET | ORAL | 2 refills | Status: DC

## 2021-01-16 NOTE — Progress Notes (Signed)
[name] OP Progress Note  01/16/2021 9:01 AM [name]  MRN:  [id number]  Chief Complaint:  Chief Complaint   Follow-up; Anxiety    HPI: [name] is a 45 year old Caucasian female, divorced, lives in [place], has a history of MDD, panic attacks, PTSD, insomnia was evaluated in the office today.  Patient today reports she recently had COVID-19 infection.  This was 3 weeks ago.  Patient reports she currently struggles with severe neuropathy pain which may have gotten worse since her COVID-19 infection.  She has been struggling with chronic pain since the past several years.  Currently follows up with [facility] pain [facility].  Patient otherwise reports mood symptoms are stable.  She is compliant on her medications.  Denies side effects.  Patient denies suicidality, homicidality or perceptual disturbances.  Patient reports sleep is good on the current medication, zolpidem.  Patient denies any other concerns today.  Visit Diagnosis:    ICD-10-CM   1. MDD (major depressive disorder), recurrent, in full remission ([name])  F33.42     2. Panic disorder  F41.0 zolpidem (AMBIEN CR) 12.5 MG CR tablet    3. PTSD (post-traumatic stress disorder)  F43.10 zolpidem (AMBIEN CR) 12.5 MG CR tablet    4. Primary insomnia  F51.01       Past Psychiatric History: Reviewed past psychiatric history from progress note on 05/09/2017  Past Medical History:  Past Medical History:  Diagnosis Date   Abnormal ECG    Anemia    Anxiety    Arthritis    B12 deficiency    Cardiomyopathy ([place])    Chronic abdominal pain    Complication of anesthesia    difficulty to get sedated during endoscopy   COVID-19 01/18/2019   DDD (degenerative disc disease), lumbar    Depression    Diabetes mellitus without complication (HCC)    Dysrhythmia    GERD (gastroesophageal reflux disease)    Headache    Iron deficiency    Neuropathy    Obesity    Ovarian cyst    Pneumonia    within past five years    PTSD (post-traumatic stress disorder)    PTSD (post-traumatic stress disorder)    Rh negative status during pregnancy    Seizures ([place])    Small bowel obstruction ([place])     Past Surgical History:  Procedure Laterality Date   ABDOMINAL HYSTERECTOMY     BILATERAL SALPINGECTOMY Bilateral 09/29/2015   Procedure: BILATERAL SALPINGECTOMY;  Surgeon: [name];  Location: [facility];  Service: Gynecology;  Laterality: Bilateral;   bowel obstruction  2011   CHOLECYSTECTOMY     COLONOSCOPY WITH PROPOFOL N/A 01/24/2018   Procedure: COLONOSCOPY WITH PROPOFOL;  Surgeon: [name];  Location: [facility];  Service: Gastroenterology;  Laterality: N/A;   DILATION AND CURETTAGE OF UTERUS     ESOPHAGOGASTRODUODENOSCOPY     ESOPHAGOGASTRODUODENOSCOPY (EGD) WITH PROPOFOL N/A 01/24/2018   Procedure: ESOPHAGOGASTRODUODENOSCOPY (EGD) WITH PROPOFOL;  Surgeon: [name];  Location: [facility];  Service: Gastroenterology;  Laterality: N/A;   GASTRIC BYPASS     gi bleed  2009   Surgery-was in ICU for three weeks   HERNIA REPAIR     OVARIAN CYST REMOVAL Left 09/29/2015   Procedure: OVARIAN CYSTECTOMY;  Surgeon: [name];  Location: [facility];  Service: Gynecology;  Laterality: Left;   ROUX-EN-Y PROCEDURE     VAGINAL HYSTERECTOMY N/A 09/29/2015   Procedure: HYSTERECTOMY VAGINAL;  Surgeon: [name];  Location: [facility];  Service: Gynecology;  Laterality: N/A;    Family Psychiatric History: Reviewed family psychiatric history from progress note on 05/09/2017  Family History:  Family History  Problem Relation Age of Onset   Arthritis/Rheumatoid Mother    Heart block Mother    Clotting disorder Mother    Osteoporosis Mother    Heart attack Mother    Anxiety disorder Mother    Depression Mother    Heart disease Father    Heart attack Father    Depression Sister    Anxiety disorder Sister     Social History: Reviewed social history from progress  note on 05/09/2017 Social History   Socioeconomic History   Marital status: Single    Spouse name: Not on file   Number of children: Not on file   Years of education: Not on file   Highest education level: Not on file  Occupational History   Not on file  Tobacco Use   Smoking status: Never   Smokeless tobacco: Never  Vaping Use   Vaping Use: Never used  Substance and Sexual Activity   Alcohol use: No    Alcohol/week: 0.0 standard drinks   Drug use: No   Sexual activity: Yes    Partners: Male    Birth control/protection: Surgical  Other Topics Concern   Not on file  Social History Narrative   Not on file   Social Determinants of Health   Financial Resource Strain: Not on file  Food Insecurity: Not on file  Transportation Needs: Not on file  Physical Activity: Not on file  Stress: Not on file  Social Connections: Not on file    Allergies:  Allergies  Allergen Reactions   Amoxicillin Anaphylaxis, Hives, Shortness Of Breath, Swelling and Rash    Has patient had a PCN reaction causing immediate rash, facial/tongue/throat swelling, SOB or lightheadedness with hypotension: Yes Has patient had a PCN reaction causing severe rash involving mucus membranes or skin necrosis: Yes Has patient had a PCN reaction that required hospitalization No Has patient had a PCN reaction occurring within the last 10 years: No If all of the above answers are "NO", then may proceed with Cephalosporin use. Other reaction(s): ANAPHYLAXIS Other reaction(s): ANAPHYLAX   Penicillins Anaphylaxis, Swelling, Rash, Shortness Of Breath and Hives    Has patient had a PCN reaction causing immediate rash, facial/tongue/throat swelling, SOB or lightheadedness with hypotension: Yes Has patient had a PCN reaction causing severe rash involving mucus membranes or skin necrosis: Yes Has patient had a PCN reaction that required hospitalization No Has patient had a PCN reaction occurring within the last 10 years:  No If all of the above answers are "NO", then may proceed with Cephalosporin use.  Other reaction(s): ANAPHYLAXIS Other reaction(s): ANAPHYLAXIS Has patient had a PCN reaction causing immediate rash, facial/tongue/throat swelling, SOB or lightheadedness with hypotension: Yes Has patient had a PCN reaction causing severe rash involving mucus membranes or skin necrosis: Yes Has patient had a PCN reaction that required hospitalization No Has patient had a PCN reaction occurring within the last 10 years: No If all of the above answers are "NO", then may proceed with Cephalosporin use. Other reaction(s): ANAPHYLAXIS Other reaction(s): ANAPHYLAX Has patient had a PCN reaction causing immediate rash, facial/tongue/throat swelling, SOB or lightheadedness with hypotension: Yes Has patient had a PCN reaction causing severe rash involving mucus membranes or skin necrosis: Yes Has patient had a PCN reaction that required hospitalization No Has patient had a PCN reaction  occurring within the last 10 years: No If all of the above answers are "NO", then may proceed with Cephalosporin use. Other reaction(s): ANAPHYLAXIS Other reaction(s): ANAPHYLAXIS    Morphine Other (See Comments)    Muscle spasms   Sulfa Antibiotics Nausea Only    Other reaction(s): VOMITING Other reaction(s): VOMITING Other reaction(s): VOMITING    Metabolic Disorder Labs: Lab Results  Component Value Date   HGBA1C 5.5 12/11/2019   MPG 111 12/11/2019   Lab Results  Component Value Date   PROLACTIN 12.1 12/11/2019   Lab Results  Component Value Date   CHOL 162 12/11/2019   TRIG 51 12/11/2019   HDL 87 12/11/2019   CHOLHDL 1.9 12/11/2019   VLDL 10 12/11/2019   LDLCALC 65 12/11/2019   Lab Results  Component Value Date   TSH 1.886 12/11/2019   TSH  05/13/2009    3.163 (NOTE) Please note change in reference ranges for ages 35W to 48Y. Test methodology is 3rd generation TSH    Therapeutic Level Labs: No results  found for: LITHIUM No results found for: VALPROATE No components found for:  CBMZ  Current Medications: Current Outpatient Medications  Medication Sig Dispense Refill   acetaminophen (TYLENOL) 325 MG tablet Take 650 mg by mouth every 4 (four) hours as needed for moderate pain.      buPROPion (WELLBUTRIN XL) 150 MG 24 hr tablet TAKE 2 TABLETS(300 MG) BY MOUTH DAILY 60 tablet 2   cyanocobalamin (,VITAMIN B-12,) 1000 MCG/ML injection Inject 1,000 mcg into the muscle every 30 (thirty) days.      diphenoxylate-atropine (LOMOTIL) 2.5-0.025 MG tablet      ergocalciferol (VITAMIN D2) 1.25 MG (50000 UT) capsule Take by mouth.     gabapentin (NEURONTIN) 300 MG capsule Take 300 mg by mouth 2 (two) times daily.     hydrOXYzine (ATARAX/VISTARIL) 25 MG tablet TAKE 1/2 TO 1 TABLET BY MOUTH TWICE DAILY AS NEEDED FOR ANXIETY 60 tablet 2   lamoTRIgine (LAMICTAL) 200 MG tablet TAKE 1/2 TABLET(100 MG) BY MOUTH TWICE DAILY 90 tablet 0   lamoTRIgine (LAMICTAL) 25 MG tablet Take 1 tablet (25 mg total) by mouth daily. 90 tablet 0   loratadine (CLARITIN) 10 MG tablet Take 10 mg by mouth daily.     Multiple Vitamins-Minerals (HM MULTIVITAMIN ADULT GUMMY) CHEW Chew 2 tablets by mouth daily.     nitrofurantoin, macrocrystal-monohydrate, (MACROBID) 100 MG capsule Take 1 capsule (100 mg total) by mouth 2 (two) times daily. 14 capsule 0   SUMAtriptan (IMITREX) 100 MG tablet Take 100 mg by mouth every 2 (two) hours as needed for migraine or headache.      thiamine (VITAMIN B-1) 100 MG tablet Take 100 mg by mouth.     topiramate (TOPAMAX) 50 MG tablet Take by mouth.     traMADol (ULTRAM) 50 MG tablet Take by mouth.     Vilazodone HCl (VIIBRYD) 40 MG TABS TAKE 1 TABLET(40 MG) BY MOUTH DAILY 90 tablet 0   metoprolol succinate (TOPROL-XL) 25 MG 24 hr tablet Take by mouth. (Patient not taking: Reported on 09/29/2020)     zolpidem (AMBIEN CR) 12.5 MG CR tablet Take 1 tablet at bedtime 30 tablet 2   No current  facility-administered medications for this visit.     Musculoskeletal: Strength & Muscle Tone: within normal limits Gait & Station: normal Patient leans: N/A  Psychiatric Specialty Exam: Review of Systems  Musculoskeletal:        Pain all over her body, does have hx  of chronic pain  Psychiatric/Behavioral:  Negative for agitation, behavioral problems, confusion, decreased concentration, dysphoric mood, hallucinations, self-injury, sleep disturbance and suicidal ideas. The patient is not nervous/anxious and is not hyperactive.   All other systems reviewed and are negative.  Blood pressure 111/78, pulse 87, temperature 98.7 F (37.1 C), temperature source Temporal, weight 149 lb (67.6 kg).Body mass index is 23.34 kg/m.  General Appearance: Casual  Eye Contact:  Fair  Speech:  Clear and Coherent  Volume:  Normal  Mood:  Euthymic  Affect:  Congruent  Thought Process:  Goal Directed and Descriptions of Associations: Intact  Orientation:  Full (Time, Place, and Person)  Thought Content: Logical   Suicidal Thoughts:  No  Homicidal Thoughts:  No  Memory:  Immediate;   Fair Recent;   Fair Remote;   Fair  Judgement:  Fair  Insight:  Fair  Psychomotor Activity:  Normal  Concentration:  Concentration: Fair and Attention Span: Fair  Recall:  [employer] of Knowledge: Fair  Language: Fair  Akathisia:  No  Handed:  Right  AIMS (if indicated): done,0  Assets:  Communication Skills Desire for Improvement Social Support  ADL's:  Intact  Cognition: WNL  Sleep:  Fair   Screenings: PHQ2-9    [place] Office Visit from 01/16/2021 in [place] Video Visit from 08/04/2020 in [place] Video Visit from 06/09/2020 in [place] Video Visit from 04/23/2020 in [place] Video Visit from 04/03/2020 in [place]  PHQ-2 Total Score 0 0 2 3 4    PHQ-9 Total Score -- -- 8 7 12       [place] [facility] from 04/27/2020 in [facility]  Video Visit from 04/03/2020 in [place] Error: Question 6 not populated No Risk        Assessment and Plan: JOELYNN DUST is a 45 year old Caucasian female who has a history of depression, anxiety, insomnia was evaluated in office today.  Patient is currently stable with regards to her mood.  Plan as noted below.  Plan MDD in remission Lamotrigine 225 mg p.o. daily divided dosage Wellbutrin XL 300 mg daily-reduced dosage Viibryd 40 mg p.o. daily   PTSD-stable Lamictal as prescribed Viibryd 40 mg p.o. daily Hydroxyzine 25 mg p.o. nightly as needed  Panic disorder-stable Hydroxyzine 25 mg manage nightly as needed   Insomnia-stable Ambien CR 12.5 mg p.o. nightly Reviewed [place] PMP aware Hydroxyzine 25 mg p.o. nightly as needed  Patient advised to follow up with pain medicine for her worsening pain.  Follow-up in clinic in 3 months or sooner if needed.  This note was generated in part or whole with voice recognition software. Voice recognition is usually quite accurate but there are transcription errors that can and very often do occur. I apologize for any typographical errors that were not detected and corrected.    Ursula Alert, [name] 01/16/2021, 9:01 AM

## 2021-01-22 ENCOUNTER — Other Ambulatory Visit: Payer: Self-pay | Admitting: *Deleted

## 2021-01-22 DIAGNOSIS — D509 Iron deficiency anemia, unspecified: Secondary | ICD-10-CM

## 2021-01-22 DIAGNOSIS — D508 Other iron deficiency anemias: Secondary | ICD-10-CM

## 2021-01-26 ENCOUNTER — Telehealth: Payer: Self-pay | Admitting: Oncology

## 2021-01-26 NOTE — Telephone Encounter (Signed)
Pt called to reschedule her appt till after Christmas. Please call back at [phone]

## 2021-01-27 ENCOUNTER — Inpatient Hospital Stay: Payer: Medicare Other

## 2021-01-29 ENCOUNTER — Inpatient Hospital Stay: Payer: Medicare Other

## 2021-01-29 ENCOUNTER — Inpatient Hospital Stay: Payer: Medicare Other | Admitting: Oncology

## 2021-02-06 ENCOUNTER — Other Ambulatory Visit: Payer: Self-pay | Admitting: Psychiatry

## 2021-02-06 DIAGNOSIS — F4310 Post-traumatic stress disorder, unspecified: Secondary | ICD-10-CM

## 2021-02-06 DIAGNOSIS — F410 Panic disorder [episodic paroxysmal anxiety] without agoraphobia: Secondary | ICD-10-CM

## 2021-02-14 ENCOUNTER — Other Ambulatory Visit: Payer: Self-pay | Admitting: Psychiatry

## 2021-02-14 DIAGNOSIS — F4310 Post-traumatic stress disorder, unspecified: Secondary | ICD-10-CM

## 2021-02-14 DIAGNOSIS — F330 Major depressive disorder, recurrent, mild: Secondary | ICD-10-CM

## 2021-02-14 DIAGNOSIS — F410 Panic disorder [episodic paroxysmal anxiety] without agoraphobia: Secondary | ICD-10-CM

## 2021-02-25 ENCOUNTER — Other Ambulatory Visit: Payer: Self-pay | Admitting: *Deleted

## 2021-02-25 DIAGNOSIS — D509 Iron deficiency anemia, unspecified: Secondary | ICD-10-CM

## 2021-03-02 ENCOUNTER — Inpatient Hospital Stay: Payer: Medicare Other

## 2021-03-02 NOTE — Progress Notes (Deleted)
[place]  [phone] [phone]  ID: [name] OB: Jul 03, 1975  MR#: [id number]  TFT#:[id number]  Patient Care Team: Leonard Downing, MD as PCP - General (Family Medicine)  CHIEF COMPLAINT: Iron deficiency anemia.   INTERVAL HISTORY: Patient was last seen in clinic in September 2018.  She returns today with complaints of increasing weakness and fatigue as well as pica eating more ice.  She otherwise feels well.  She has no neurologic complaints.  She denies any recent fevers or illnesses.  She has a good appetite and denies weight loss.  She has no chest pain, shortness of breath, cough, or hemoptysis.  She denies any nausea, vomiting, constipation, or diarrhea.  She has no melena or hematochezia.  She has no urinary complaints.  Patient offers no further specific complaints today.  REVIEW OF SYSTEMS:   Review of Systems  Constitutional:  Positive for malaise/fatigue. Negative for fever and weight loss.  Respiratory: Negative.  Negative for cough and shortness of breath.   Cardiovascular: Negative.  Negative for chest pain and leg swelling.  Gastrointestinal: Negative.  Negative for abdominal pain, blood in stool and melena.  Genitourinary: Negative.  Negative for hematuria.  Musculoskeletal: Negative.  Negative for back pain, joint pain and neck pain.  Skin: Negative.  Negative for rash.  Neurological:  Positive for weakness. Negative for dizziness.  Psychiatric/Behavioral: Negative.  Negative for depression. The patient is not nervous/anxious.    As per HPI. Otherwise, a complete review of systems is negative.  PAST MEDICAL HISTORY: Past Medical History:  Diagnosis Date   Abnormal ECG    Anemia    Anxiety    Arthritis    B12 deficiency    Cardiomyopathy ([place])    Chronic abdominal pain    Complication of anesthesia    difficulty to get sedated during endoscopy   COVID-19 01/18/2019   DDD (degenerative disc disease),  lumbar    Depression    Diabetes mellitus without complication (HCC)    Dysrhythmia    GERD (gastroesophageal reflux disease)    Headache    Iron deficiency    Neuropathy    Obesity    Ovarian cyst    Pneumonia    within past five years   PTSD (post-traumatic stress disorder)    PTSD (post-traumatic stress disorder)    Rh negative status during pregnancy    Seizures ([place])    Small bowel obstruction ([place])     PAST SURGICAL HISTORY: Past Surgical History:  Procedure Laterality Date   ABDOMINAL HYSTERECTOMY     BILATERAL SALPINGECTOMY Bilateral 09/29/2015   Procedure: BILATERAL SALPINGECTOMY;  Surgeon: Benjaman Kindler, MD;  Location: [facility];  Service: Gynecology;  Laterality: Bilateral;   bowel obstruction  2011   CHOLECYSTECTOMY     COLONOSCOPY WITH PROPOFOL N/A 01/24/2018   Procedure: COLONOSCOPY WITH PROPOFOL;  Surgeon: Toledo, Benay Pike, MD;  Location: [facility];  Service: Gastroenterology;  Laterality: N/A;   DILATION AND CURETTAGE OF UTERUS     ESOPHAGOGASTRODUODENOSCOPY     ESOPHAGOGASTRODUODENOSCOPY (EGD) WITH PROPOFOL N/A 01/24/2018   Procedure: ESOPHAGOGASTRODUODENOSCOPY (EGD) WITH PROPOFOL;  Surgeon: Toledo, Benay Pike, MD;  Location: [facility];  Service: Gastroenterology;  Laterality: N/A;   GASTRIC BYPASS     gi bleed  2009   Surgery-was in ICU for three weeks   HERNIA REPAIR     OVARIAN CYST REMOVAL Left 09/29/2015   Procedure: OVARIAN CYSTECTOMY;  Surgeon: Benjaman Kindler, MD;  Location: [facility];  Service:  Gynecology;  Laterality: Left;   ROUX-EN-Y PROCEDURE     VAGINAL HYSTERECTOMY N/A 09/29/2015   Procedure: HYSTERECTOMY VAGINAL;  Surgeon: Benjaman Kindler, MD;  Location: [facility];  Service: Gynecology;  Laterality: N/A;    FAMILY HISTORY Family History  Problem Relation Age of Onset   Arthritis/Rheumatoid Mother    Heart block Mother    Clotting disorder Mother    Osteoporosis Mother    Heart attack Mother    Anxiety disorder Mother     Depression Mother    Heart disease Father    Heart attack Father    Depression Sister    Anxiety disorder Sister        ADVANCED DIRECTIVES:    HEALTH MAINTENANCE: Social History   Tobacco Use   Smoking status: Never   Smokeless tobacco: Never  Vaping Use   Vaping Use: Never used  Substance Use Topics   Alcohol use: No    Alcohol/week: 0.0 standard drinks   Drug use: No     Colonoscopy:  PAP:  Bone density:  Lipid panel:  Allergies  Allergen Reactions   Amoxicillin Anaphylaxis, Hives, Shortness Of Breath, Swelling and Rash    Has patient had a PCN reaction causing immediate rash, facial/tongue/throat swelling, SOB or lightheadedness with hypotension: Yes Has patient had a PCN reaction causing severe rash involving mucus membranes or skin necrosis: Yes Has patient had a PCN reaction that required hospitalization No Has patient had a PCN reaction occurring within the last 10 years: No If all of the above answers are "NO", then may proceed with Cephalosporin use. Other reaction(s): ANAPHYLAXIS Other reaction(s): ANAPHYLAX   Penicillins Anaphylaxis, Swelling, Rash, Shortness Of Breath and Hives    Has patient had a PCN reaction causing immediate rash, facial/tongue/throat swelling, SOB or lightheadedness with hypotension: Yes Has patient had a PCN reaction causing severe rash involving mucus membranes or skin necrosis: Yes Has patient had a PCN reaction that required hospitalization No Has patient had a PCN reaction occurring within the last 10 years: No If all of the above answers are "NO", then may proceed with Cephalosporin use.  Other reaction(s): ANAPHYLAXIS Other reaction(s): ANAPHYLAXIS Has patient had a PCN reaction causing immediate rash, facial/tongue/throat swelling, SOB or lightheadedness with hypotension: Yes Has patient had a PCN reaction causing severe rash involving mucus membranes or skin necrosis: Yes Has patient had a PCN reaction that required  hospitalization No Has patient had a PCN reaction occurring within the last 10 years: No If all of the above answers are "NO", then may proceed with Cephalosporin use. Other reaction(s): ANAPHYLAXIS Other reaction(s): ANAPHYLAX Has patient had a PCN reaction causing immediate rash, facial/tongue/throat swelling, SOB or lightheadedness with hypotension: Yes Has patient had a PCN reaction causing severe rash involving mucus membranes or skin necrosis: Yes Has patient had a PCN reaction that required hospitalization No Has patient had a PCN reaction occurring within the last 10 years: No If all of the above answers are "NO", then may proceed with Cephalosporin use. Other reaction(s): ANAPHYLAXIS Other reaction(s): ANAPHYLAXIS    Morphine Other (See Comments)    Muscle spasms   Sulfa Antibiotics Nausea Only    Other reaction(s): VOMITING Other reaction(s): VOMITING Other reaction(s): VOMITING    Current Outpatient Medications  Medication Sig Dispense Refill   acetaminophen (TYLENOL) 325 MG tablet Take 650 mg by mouth every 4 (four) hours as needed for moderate pain.      buPROPion (WELLBUTRIN XL) 150 MG 24 hr tablet TAKE 2  TABLETS(300 MG) BY MOUTH DAILY 60 tablet 2   cyanocobalamin (,VITAMIN B-12,) 1000 MCG/ML injection Inject 1,000 mcg into the muscle every 30 (thirty) days.      diphenoxylate-atropine (LOMOTIL) 2.5-0.025 MG tablet      ergocalciferol (VITAMIN D2) 1.25 MG (50000 UT) capsule Take by mouth.     gabapentin (NEURONTIN) 300 MG capsule Take 300 mg by mouth 2 (two) times daily.     hydrOXYzine (ATARAX/VISTARIL) 25 MG tablet TAKE 1/2 TO 1 TABLET BY MOUTH TWICE DAILY AS NEEDED FOR ANXIETY 60 tablet 2   lamoTRIgine (LAMICTAL) 200 MG tablet TAKE 1/2 TABLET(100 MG) BY MOUTH TWICE DAILY 90 tablet 0   lamoTRIgine (LAMICTAL) 25 MG tablet Take 1 tablet (25 mg total) by mouth daily. 90 tablet 0   loratadine (CLARITIN) 10 MG tablet Take 10 mg by mouth daily.     metoprolol succinate  (TOPROL-XL) 25 MG 24 hr tablet Take by mouth. (Patient not taking: Reported on 09/29/2020)     Multiple Vitamins-Minerals (HM MULTIVITAMIN ADULT GUMMY) CHEW Chew 2 tablets by mouth daily.     nitrofurantoin, macrocrystal-monohydrate, (MACROBID) 100 MG capsule Take 1 capsule (100 mg total) by mouth 2 (two) times daily. 14 capsule 0   SUMAtriptan (IMITREX) 100 MG tablet Take 100 mg by mouth every 2 (two) hours as needed for migraine or headache.      thiamine (VITAMIN B-1) 100 MG tablet Take 100 mg by mouth.     topiramate (TOPAMAX) 50 MG tablet Take by mouth.     traMADol (ULTRAM) 50 MG tablet Take by mouth.     Vilazodone HCl (VIIBRYD) 40 MG TABS TAKE 1 TABLET(40 MG) BY MOUTH DAILY 90 tablet 0   zolpidem (AMBIEN CR) 12.5 MG CR tablet Take 1 tablet at bedtime 30 tablet 2   No current facility-administered medications for this visit.    OBJECTIVE: There were no vitals filed for this visit.    There is no height or weight on file to calculate BMI.    ECOG FS:0 - Asymptomatic  General: Well-developed, well-nourished, no acute distress. Eyes: Pink conjunctiva, anicteric sclera. HEENT: Normocephalic, moist mucous membranes. Lungs: No audible wheezing or coughing. Heart: Regular rate and rhythm. Abdomen: Soft, nontender, no obvious distention. Musculoskeletal: No edema, cyanosis, or clubbing. Neuro: Alert, answering all questions appropriately. Cranial nerves grossly intact. Skin: No rashes or petechiae noted. Psych: Normal affect.   LAB RESULTS:  Lab Results  Component Value Date   NA 136 04/26/2018   K 3.5 04/26/2018   CL 108 04/26/2018   CO2 20 (L) 04/26/2018   GLUCOSE 108 (H) 04/26/2018   BUN 9 04/26/2018   CREATININE 0.57 04/26/2018   CALCIUM 8.7 (L) 04/26/2018   PROT 7.4 04/26/2018   ALBUMIN 4.1 04/26/2018   AST 17 04/26/2018   ALT 18 04/26/2018   ALKPHOS 32 (L) 04/26/2018   BILITOT 0.9 04/26/2018   GFRNONAA >60 04/26/2018   GFRAA >60 04/26/2018    Lab Results   Component Value Date   WBC 5.0 09/26/2020   NEUTROABS 3.1 09/26/2020   HGB 13.2 09/26/2020   HCT 41.0 09/26/2020   MCV 93.4 09/26/2020   PLT 280 09/26/2020   Lab Results  Component Value Date   FERRITIN 42 09/26/2020   Lab Results  Component Value Date   IRON 94 09/26/2020   TIBC 372 09/26/2020   IRONPCTSAT 25 09/26/2020      STUDIES: No results found.  ASSESSMENT: Iron deficiency anemia.  PLAN:    1.  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  Patient's hemoglobin is only mildly decreased at 11.6, but her iron stores are significantly reduced and she is symptomatic.  Proceed with 510 mg IV Feraheme today.  Return to clinic in 1 week for second infusion.  Patient will then return to clinic in 4 months with repeat laboratory work, further evaluation, and continuation of treatment if needed.  2. Rheumatoid arthritis: Continued workup to rheumatology. 3. Depression/anxiety:  Continue current medications as prescribed.  I spent a total of 30 minutes reviewing chart data, face-to-face evaluation with the patient, counseling and coordination of care as detailed above.   Patient expressed understanding and was in agreement with this plan. She also understands that She can call clinic at any time with any questions, concerns, or complaints.     Lloyd Huger, MD   03/02/2021 3:38 PM

## 2021-03-03 ENCOUNTER — Encounter: Payer: Self-pay | Admitting: Oncology

## 2021-03-03 ENCOUNTER — Inpatient Hospital Stay: Payer: Medicare Other | Admitting: Oncology

## 2021-03-03 ENCOUNTER — Inpatient Hospital Stay: Payer: Medicare Other

## 2021-03-03 ENCOUNTER — Inpatient Hospital Stay: Payer: Medicare Other | Attending: Oncology

## 2021-03-03 ENCOUNTER — Other Ambulatory Visit: Payer: Self-pay

## 2021-03-03 ENCOUNTER — Telehealth: Payer: Self-pay

## 2021-03-03 DIAGNOSIS — D509 Iron deficiency anemia, unspecified: Secondary | ICD-10-CM

## 2021-03-03 LAB — CBC WITH DIFFERENTIAL/PLATELET
Abs Immature Granulocytes: 0.03 10*3/uL (ref 0.00–0.07)
Basophils Absolute: 0.1 10*3/uL (ref 0.0–0.1)
Basophils Relative: 1 %
Eosinophils Absolute: 0.1 10*3/uL (ref 0.0–0.5)
Eosinophils Relative: 1 %
HCT: 42.9 % (ref 36.0–46.0)
Hemoglobin: 13.8 g/dL (ref 12.0–15.0)
Immature Granulocytes: 0 %
Lymphocytes Relative: 12 %
Lymphs Abs: 1.1 10*3/uL (ref 0.7–4.0)
MCH: 29.2 pg (ref 26.0–34.0)
MCHC: 32.2 g/dL (ref 30.0–36.0)
MCV: 90.9 fL (ref 80.0–100.0)
Monocytes Absolute: 0.6 10*3/uL (ref 0.1–1.0)
Monocytes Relative: 6 %
Neutro Abs: 7.6 10*3/uL (ref 1.7–7.7)
Neutrophils Relative %: 80 %
Platelets: 391 10*3/uL (ref 150–400)
RBC: 4.72 MIL/uL (ref 3.87–5.11)
RDW: 13.1 % (ref 11.5–15.5)
WBC: 9.4 10*3/uL (ref 4.0–10.5)
nRBC: 0 % (ref 0.0–0.2)

## 2021-03-03 LAB — FERRITIN: Ferritin: 60 ng/mL (ref 11–307)

## 2021-03-03 LAB — IRON AND TIBC
Iron: 55 ug/dL (ref 28–170)
Saturation Ratios: 14 % (ref 10.4–31.8)
TIBC: 392 ug/dL (ref 250–450)
UIBC: 337 ug/dL

## 2021-03-03 NOTE — Telephone Encounter (Signed)
Per Dr. Grayland Ormond patient lab work looked good and she doesn't need her feraheme and doesn't need appointment for today. I spoke with patient to let her know per Woodfin Ganja we will see her back in 3-4 months.

## 2021-03-05 ENCOUNTER — Emergency Department
Admission: EM | Admit: 2021-03-05 | Discharge: 2021-03-05 | Disposition: A | Discharge status: Home / Self Care | Payer: Medicare Other | Attending: Emergency Medicine | Admitting: Emergency Medicine

## 2021-03-05 ENCOUNTER — Other Ambulatory Visit: Payer: Self-pay

## 2021-03-05 ENCOUNTER — Emergency Department: Payer: Medicare Other

## 2021-03-05 DIAGNOSIS — K529 Noninfective gastroenteritis and colitis, unspecified: Secondary | ICD-10-CM | POA: Insufficient documentation

## 2021-03-05 DIAGNOSIS — E860 Dehydration: Secondary | ICD-10-CM | POA: Diagnosis not present

## 2021-03-05 DIAGNOSIS — R197 Diarrhea, unspecified: Secondary | ICD-10-CM | POA: Diagnosis present

## 2021-03-05 LAB — BASIC METABOLIC PANEL
Anion gap: 14 (ref 5–15)
BUN: 13 mg/dL (ref 6–20)
CO2: 20 mmol/L — ABNORMAL LOW (ref 22–32)
Calcium: 9.9 mg/dL (ref 8.9–10.3)
Chloride: 104 mmol/L (ref 98–111)
Creatinine, Ser: 0.77 mg/dL (ref 0.44–1.00)
GFR, Estimated: >60 mL/min (ref >60)
Glucose, Bld: 98 mg/dL (ref 70–99)
Potassium: 4.3 mmol/L (ref 3.5–5.1)
Sodium: 138 mmol/L (ref 135–145)

## 2021-03-05 LAB — CBC
HCT: 43.8 % (ref 36.0–46.0)
Hemoglobin: 14.3 g/dL (ref 12.0–15.0)
MCH: 29.9 pg (ref 26.0–34.0)
MCHC: 32.6 g/dL (ref 30.0–36.0)
MCV: 91.6 fL (ref 80.0–100.0)
Platelets: 398 10*3/uL (ref 150–400)
RBC: 4.78 MIL/uL (ref 3.87–5.11)
RDW: 13 % (ref 11.5–15.5)
WBC: 7.2 10*3/uL (ref 4.0–10.5)
nRBC: 0 % (ref 0.0–0.2)

## 2021-03-05 LAB — URINALYSIS, COMPLETE (UACMP) WITH MICROSCOPIC
Bacteria, UA: NONE SEEN
Glucose, UA: NEGATIVE mg/dL
Hgb urine dipstick: NEGATIVE
Ketones, ur: 80 mg/dL — AB
Leukocytes,Ua: NEGATIVE
Nitrite: NEGATIVE
Protein, ur: 30 mg/dL — AB
Specific Gravity, Urine: 1.036 — ABNORMAL HIGH (ref 1.005–1.030)
pH: 5 (ref 5.0–8.0)

## 2021-03-05 LAB — LIPASE, BLOOD: Lipase: 44 U/L (ref 11–51)

## 2021-03-05 MED ORDER — BUTALBITAL-APAP-CAFFEINE 50-325-40 MG PO TABS
2.0000 | ORAL_TABLET | Freq: Once | ORAL | Status: AC
Start: 2021-03-05 — End: 2021-03-05
  Administered 2021-03-05: 2 via ORAL
  Filled 2021-03-05: qty 2

## 2021-03-05 MED ORDER — ONDANSETRON HCL 4 MG/2ML IJ SOLN
4.0000 mg | Freq: Once | INTRAMUSCULAR | Status: AC
  Administered 2021-03-05: 4 mg via INTRAVENOUS
  Filled 2021-03-05: qty 2

## 2021-03-05 MED ORDER — ONDANSETRON 4 MG PO TBDP: 4.0000 mg | ORAL_TABLET | Freq: Three times a day (TID) | ORAL | 0 refills | Status: DC | PRN

## 2021-03-05 MED ORDER — LACTATED RINGERS IV BOLUS
2000.0000 mL | Freq: Once | INTRAVENOUS | Status: AC
  Administered 2021-03-05: 2000 mL via INTRAVENOUS

## 2021-03-05 MED ORDER — METOCLOPRAMIDE HCL 5 MG/ML IJ SOLN
10.0000 mg | Freq: Once | INTRAMUSCULAR | Status: AC
  Administered 2021-03-05: 10 mg via INTRAVENOUS
  Filled 2021-03-05: qty 2

## 2021-03-05 MED ORDER — IOHEXOL 300 MG/ML  SOLN
80.0000 mL | Freq: Once | INTRAMUSCULAR | Status: AC | PRN
  Administered 2021-03-05: 80 mL via INTRAVENOUS

## 2021-03-05 NOTE — ED Triage Notes (Signed)
Pt to ED for diarrhea and nausea for several days. States emesis if eating or drinking. +lower abd cramping

## 2021-03-05 NOTE — ED Provider Notes (Signed)
[facility] Provider Note    Event Date/Time   First MD Initiated Contact with Patient 03/05/21 1056     (approximate)   History   Diarrhea   HPI  [name] is a 46 y.o. female who presents to the ED for evaluation of Diarrhea    I reviewed her urgent care visit from earlier this morning where patient was seen for similar complaints of N, V, D.  Looks like she was recently placed on doxycycline for a sinus infection 3 days ago.  Patient has a remote history of gastric bypass  Patient presents to the ED for evaluation of about 3 days of emesis, nausea and diarrhea.  She reports recurrent watery stool and nonbloody nonbilious emesis.  Denies fevers, hematochezia.  Denies severe abdominal pain, but reports some mild discomfort to her LUQ.[name]  Reports her sinus symptoms cleared up with doxycycline  Physical Exam   Triage Vital Signs: ED Triage Vitals [03/05/21 0925]  Enc Vitals Group     BP 130/77     Pulse Rate 98     Resp 18     Temp 98.2 F (36.8 C)     Temp Source Oral     SpO2 99 %     Weight 139 lb (63 kg)     Height 5\' 7"  (1.702 m)     Head Circumference      Peak Flow      Pain Score 5     Pain Loc      Pain Edu?      Excl. in [place]?     Most recent vital signs: Vitals:   03/05/21 0925 03/05/21 1150  BP: 130/77 131/70  Pulse: 98 90  Resp: 18 (!) 21  Temp: 98.2 F (36.8 C) 98 F (36.7 C)  SpO2: 99% 100%    General: Awake, no distress.  Dry mucous membranes. CV:  Good peripheral perfusion.  Resp:  Normal effort.  Abd:  No distention.  Mild tenderness to left-sided abdomen without guarding or peritoneal features.  Benign lower abdomen. MSK:  No deformity noted.  Neuro:  No focal deficits appreciated. Other:     ED Results / Procedures / Treatments   Labs (all labs ordered are listed, but only abnormal results are displayed) Labs Reviewed  BASIC METABOLIC PANEL - Abnormal; Notable for the following components:       Result Value   CO2 20 (*)    All other components within normal limits  URINALYSIS, COMPLETE (UACMP) WITH MICROSCOPIC - Abnormal; Notable for the following components:   Color, Urine YELLOW (*)    APPearance CLOUDY (*)    Specific Gravity, Urine 1.036 (*)    Bilirubin Urine SMALL (*)    Ketones, ur 80 (*)    Protein, ur 30 (*)    All other components within normal limits  CBC  LIPASE, BLOOD    EKG    RADIOLOGY CT abdomen/pelvis reviewed by me with postsurgical changes without evidence of SBO.  Official radiology report(s): CT ABDOMEN PELVIS W CONTRAST  Result Date: 03/05/2021 CLINICAL DATA:  Status post gastric bypass with left-sided pain, nausea, emesis, and diarrhea. Evaluate signs of small-bowel obstruction. History of multiple surgeries to upper abdomen. EXAM: CT ABDOMEN AND PELVIS WITH CONTRAST TECHNIQUE: Multidetector CT imaging of the abdomen and pelvis was performed using the standard protocol following bolus administration of intravenous contrast. RADIATION DOSE REDUCTION: This exam was performed according to the departmental dose-optimization program which includes  automated exposure control, adjustment of the mA and/or kV according to patient size and/or use of iterative reconstruction technique. CONTRAST:  51mL OMNIPAQUE IOHEXOL 300 MG/ML  SOLN COMPARISON:  CT abdomen and pelvis 04/26/2018 FINDINGS: Lower chest: Lung bases are unremarkable. Hepatobiliary: Smooth liver contours. Focal fatty deposition adjacent to the falciform ligament is again incidentally noted. Status post cholecystectomy. No intrahepatic or extrahepatic biliary ductal dilatation. Pancreas: No mass or inflammatory fat stranding. No pancreatic ductal dilatation is seen. Spleen: Normal in size without focal abnormality. Adrenals/Urinary Tract: Adrenal glands are unremarkable. The kidneys enhance uniformly and are symmetric in size without hydronephrosis. No renal stone is seen. No renal mass is seen. No focal  urinary bladder wall thickening. Stomach/Bowel: Postsurgical changes are again seen of gastric bypass with gastrojejunostomy and jejunojejunostomy sutures. No dilated loops of bowel to indicate bowel obstruction. The appendix is not confidently identified, however no inflammatory changes are seen around the cecum to indicate secondary signs of acute appendicitis. There is moderate distention of the distal sigmoid colon and rectum with air. Vascular/Lymphatic: No abdominal aortic aneurysm. Reproductive: The uterus is surgically absent. No adnexal mass is seen. Other: No abdominal wall hernia or abnormality.No abdominopelvic ascites. No pneumoperitoneum. Musculoskeletal: Moderate L5-S1 disc space narrowing and endplate sclerosis degenerative changes. IMPRESSION:: IMPRESSION: 1. Postsurgical changes of gastric bypass. No evidence of bowel obstruction. No inflammatory bowel changes. 2. No acute abnormality is seen within the abdomen or pelvis. Electronically Signed   By: Yvonne Kendall M.D.   On: 03/05/2021 13:05    PROCEDURES and INTERVENTIONS:  .1-3 Lead EKG Interpretation Performed by: Vladimir Crofts, MD Authorized by: Vladimir Crofts, MD     Interpretation: normal     ECG rate:  91   ECG rate assessment: normal     Rhythm: sinus rhythm     Ectopy: none     Conduction: normal    Medications  lactated ringers bolus 2,000 mL (0 mLs Intravenous Stopped 03/05/21 1328)  ondansetron (ZOFRAN) injection 4 mg (4 mg Intravenous Given 03/05/21 1107)  butalbital-acetaminophen-caffeine (FIORICET) 50-325-40 MG per tablet 2 tablet (2 tablets Oral Given 03/05/21 1202)  iohexol (OMNIPAQUE) 300 MG/ML solution 80 mL (80 mLs Intravenous Contrast Given 03/05/21 1244)  metoCLOPramide (REGLAN) injection 10 mg (10 mg Intravenous Given 03/05/21 1354)     IMPRESSION / MDM / ASSESSMENT AND PLAN / ED COURSE  I reviewed the triage vital signs and the nursing notes.  46 year old female with history of gastric bypass presents to  the ED with evidence of dehydration from likely gastroenteritis, ultimately suitable for outpatient management.  Vitals are normal on room air and she looks dehydrated on examination with dry mucous membranes.  Has some mild LUQ and left-sided tenderness to palpation, but no guarding or peritoneal features.  Work-up is consistent with dehydration with ketonuria and mild decrease in bicarb.  Otherwise normal electrolytes and no evidence of sepsis or CBC derangements.  Lipase is normal.  Due to her surgical history, CT obtained without evidence of SBO, focal colitis or other complication.  After IV rehydration and antiemetics, she is feeling better and tolerating p.o. intake.  We will provide prescription for antiemetics at home and discharged with return precautions.  Clinical Course as of 03/05/21 1437  Thu Mar 05, 2021  1413 Reassessed.  Feeling better.  Finished 2 L of fluids and antiemetics.  Has been sipping on water without emesis.  We discussed reassuring CT scan and management at home.  She is in agreement. [DS]  Clinical Course User Index [DS] Vladimir Crofts, MD     FINAL CLINICAL IMPRESSION(S) / ED DIAGNOSES   Final diagnoses:  Gastroenteritis  Dehydration     Rx / DC Orders   ED Discharge Orders          Ordered    ondansetron (ZOFRAN-ODT) 4 MG disintegrating tablet  Every 8 hours PRN        03/05/21 1414             Note:  This document was prepared using Dragon voice recognition software and may include unintentional dictation errors.   Vladimir Crofts, MD 03/05/21 1438

## 2021-03-13 ENCOUNTER — Other Ambulatory Visit: Payer: Self-pay

## 2021-03-13 ENCOUNTER — Emergency Department: Payer: Medicare Other

## 2021-03-13 ENCOUNTER — Emergency Department
Admission: EM | Admit: 2021-03-13 | Discharge: 2021-03-13 | Disposition: A | Discharge status: Home / Self Care | Payer: Medicare Other | Attending: Emergency Medicine | Admitting: Emergency Medicine

## 2021-03-13 DIAGNOSIS — R824 Acetonuria: Secondary | ICD-10-CM | POA: Diagnosis not present

## 2021-03-13 DIAGNOSIS — J0100 Acute maxillary sinusitis, unspecified: Secondary | ICD-10-CM | POA: Diagnosis not present

## 2021-03-13 DIAGNOSIS — E860 Dehydration: Secondary | ICD-10-CM | POA: Diagnosis not present

## 2021-03-13 DIAGNOSIS — R42 Dizziness and giddiness: Secondary | ICD-10-CM | POA: Insufficient documentation

## 2021-03-13 DIAGNOSIS — R110 Nausea: Secondary | ICD-10-CM | POA: Diagnosis not present

## 2021-03-13 DIAGNOSIS — R1012 Left upper quadrant pain: Secondary | ICD-10-CM | POA: Insufficient documentation

## 2021-03-13 LAB — URINALYSIS, COMPLETE (UACMP) WITH MICROSCOPIC
Bacteria, UA: NONE SEEN
Glucose, UA: NEGATIVE mg/dL
Hgb urine dipstick: NEGATIVE
Ketones, ur: 80 mg/dL — AB
Leukocytes,Ua: NEGATIVE
Nitrite: NEGATIVE
Protein, ur: NEGATIVE mg/dL
Specific Gravity, Urine: >1.03 — ABNORMAL HIGH (ref 1.005–1.030)
pH: 5 (ref 5.0–8.0)

## 2021-03-13 LAB — BASIC METABOLIC PANEL
Anion gap: 9 (ref 5–15)
BUN: 11 mg/dL (ref 6–20)
CO2: 25 mmol/L (ref 22–32)
Calcium: 9.5 mg/dL (ref 8.9–10.3)
Chloride: 105 mmol/L (ref 98–111)
Creatinine, Ser: 0.85 mg/dL (ref 0.44–1.00)
GFR, Estimated: >60 mL/min (ref >60)
Glucose, Bld: 89 mg/dL (ref 70–99)
Potassium: 4.1 mmol/L (ref 3.5–5.1)
Sodium: 139 mmol/L (ref 135–145)

## 2021-03-13 LAB — CBC
HCT: 43.8 % (ref 36.0–46.0)
Hemoglobin: 14 g/dL (ref 12.0–15.0)
MCH: 29.4 pg (ref 26.0–34.0)
MCHC: 32 g/dL (ref 30.0–36.0)
MCV: 91.8 fL (ref 80.0–100.0)
Platelets: 270 10*3/uL (ref 150–400)
RBC: 4.77 MIL/uL (ref 3.87–5.11)
RDW: 12.6 % (ref 11.5–15.5)
WBC: 8.4 10*3/uL (ref 4.0–10.5)
nRBC: 0 % (ref 0.0–0.2)

## 2021-03-13 LAB — LIPASE, BLOOD: Lipase: 45 U/L (ref 11–51)

## 2021-03-13 MED ORDER — DOXYCYCLINE HYCLATE 100 MG PO TABS: 100.0000 mg | ORAL_TABLET | Freq: Two times a day (BID) | ORAL | 0 refills | Status: AC

## 2021-03-13 MED ORDER — LACTATED RINGERS IV BOLUS
1000.0000 mL | Freq: Once | INTRAVENOUS | Status: AC
Start: 2021-03-13 — End: 2021-03-13
  Administered 2021-03-13: 1000 mL via INTRAVENOUS

## 2021-03-13 MED ORDER — ONDANSETRON 4 MG PO TBDP: 4.0000 mg | ORAL_TABLET | Freq: Three times a day (TID) | ORAL | 0 refills | Status: AC | PRN

## 2021-03-13 MED ORDER — ONDANSETRON HCL 4 MG/2ML IJ SOLN
4.0000 mg | Freq: Once | INTRAMUSCULAR | Status: AC
  Administered 2021-03-13: 4 mg via INTRAVENOUS
  Filled 2021-03-13: qty 2

## 2021-03-13 NOTE — Discharge Instructions (Signed)
-  Recommend nasal decongestants such as pseudoephedrine or short-term use of Afrin (oxymetazoline)  -For ongoing mucus production, recommend trying Astepro (azelastine) spray. -Take the entire course of antibiotics as prescribed. -Take ondansetron as needed for nausea. -Follow-up with the gastroenterologist listed above. -Return to the emergency department anytime if you begin to experience any new or worsening symptoms.

## 2021-03-13 NOTE — ED Notes (Signed)
Pt stated she did not want her vitals repeated. Pt left.

## 2021-03-13 NOTE — ED Notes (Signed)
See triage note.states she was seen about 1 week ago with some n/v/d and dizziness   states the nausea and diarrhea was better  but cont's to have LUQ

## 2021-03-13 NOTE — ED Provider Notes (Signed)
[facility] Provider Note    Event Date/Time   First MD Initiated Contact with Patient 03/13/21 1410     (approximate)   History   Chief Complaint Dizziness   HPI [name] is a 46 y.o. female, history of anxietydepression and remote history of gastric bypass, presents the emergency department for evaluation of abdominal pain and nausea.  Patient states that she has been experiencing left upper quadrant abdominal pain, nausea, and intermittent dizziness since 03/02/2021.  She states that pain is exacerbated by eating. She states that she initially presented to the emergency department on 03/05/2021 for similar symptoms where she was diagnosed with gastroenteritis after a negative abdominal CT scan and negative lab work-up.  In parallel, she states that she was also experiencing a sinus infection which she had received antibiotics for earlier that morning from urgent care.  Since then, she states that she has continued to have persistent nausea, though no vomiting or diarrhea.  She has ran out of her nausea medication and she believes that her sinus infection has failed to improve because she stopped taking her antibiotics after a couple days of treatment.  Denies fever/chills, chest pain, shortness of breath, flank pain, headache, or urinary symptoms.   Per external records review, [facility] was evaluated at [facility]21 where she was prescribed doxycycline for sinusitis.  History Limitations: No limitations.      Physical Exam  Triage Vital Signs: ED Triage Vitals  Enc Vitals Group     BP 03/13/21 1257 124/76     Pulse Rate 03/13/21 1257 87     Resp 03/13/21 1257 18     Temp 03/13/21 1257 98.6 F (37 C)     Temp Source 03/13/21 1257 Oral     SpO2 03/13/21 1257 94 %     Weight 03/13/21 1258 139 lb (63 kg)     Height 03/13/21 1258 5\' 7"  (1.702 m)     Head Circumference --      Peak Flow --      Pain Score 03/13/21 1258 1     Pain Loc --       Pain Edu? --      Excl. in [place]? --     Most recent vital signs: Vitals:   03/13/21 1257 03/13/21 1519  BP: 124/76 111/75  Pulse: 87 74  Resp: 18 16  Temp: 98.6 F (37 C)   SpO2: 94% 100%    General: Awake, appears uncomfortable. CV: Good peripheral perfusion.  Resp: Normal effort.  Abd: Soft, non-tender. No distention.  Neuro: At baseline  Other: Dry mucous membranes.   Physical Exam    ED Results / Procedures / Treatments  Labs (all labs ordered are listed, but only abnormal results are displayed) Labs Reviewed  URINALYSIS, COMPLETE (UACMP) WITH MICROSCOPIC - Abnormal; Notable for the following components:      Result Value   APPearance CLOUDY (*)    Specific Gravity, Urine >1.030 (*)    Bilirubin Urine MODERATE (*)    Ketones, ur 80 (*)    All other components within normal limits  CBC  BASIC METABOLIC PANEL  LIPASE, BLOOD     EKG Sinus rhythm, rate 87, no ST segment changes, no AV blocks, no axis deviations.   RADIOLOGY  ED Provider Interpretation: I personally interpreted the chest x-ray.  No acute cardiopulmonary disease  DG Chest 2 View  Result Date: 03/13/2021 CLINICAL DATA:  Shortness of breath EXAM: CHEST -  2 VIEW COMPARISON:  Chest x-ray 01/30/2016 FINDINGS: Heart size and mediastinal contours are within normal limits. Lungs are hyperinflated. No suspicious pulmonary opacities identified. No pleural effusion or pneumothorax visualized. No acute osseous abnormality appreciated. IMPRESSION: No acute intrathoracic process identified. Electronically Signed   By: Ofilia Neas M.D.   On: 03/13/2021 13:42    PROCEDURES:  Critical Care performed: None.  Procedures    MEDICATIONS ORDERED IN ED: Medications  lactated ringers bolus 1,000 mL (1,000 mLs Intravenous New Bag/Given 03/13/21 1507)  ondansetron (ZOFRAN) injection 4 mg (4 mg Intravenous Given 03/13/21 1527)     IMPRESSION / MDM / ASSESSMENT AND PLAN / ED COURSE  I reviewed the triage  vital signs and the nursing notes.                              [name] is a 46 y.o. female, story of anxietydepression and remote history of gastric bypass, presents the emergency department for evaluation of abdominal pain and nausea.  Patient states that she has been experiencing left upper quadrant abdominal pain, nausea, and intermittent dizziness since 03/02/2021  Differential diagnosis includes, but is not limited to, gastroenteritis, gastric bypass complication, pancreatitis, appendicitis, sinusitis   ED Course Vital signs within normal limits.  She is afebrile.  Urinalysis significant for elevated specific gravity and ketonuria, consistent with dehydration.  We will go ahead and treat with 1000 mL of LR.  We will additionally provide ondansetron for nausea.  Lipase unremarkable at 45.  BMP shows no electrolyte abnormalities.  CBC unremarkable for leukocytosis or anemia.  Chest x-ray shows no acute cardiopulmonary abnormalities.  EKG unremarkable.  Assessment/Plan Physical exam unremarkable for any major abdominal tenderness.  Given unremarkable labs, unlikely to be any serious or life-threatening pathology.  Signs and symptoms are consistent with gastroenteritis. The cessation of her vomiting and diarrhea is reassuring, though she does continue to endorse persistent nausea which is returning her from hydrating at home.  She appeared to respond well to fluid bolus.  We will provide her with a prescription for ondansetron at home.    Additionally she continues to endorse maxillary sinus tenderness, particular on the right side.  This is consistent with her original presentation at urgent care a week prior which she says she started antibiotics, but stopped after it started to resolve.  Her symptoms have not returned.  I advised her that continued antibiotics has the potential to cause worsening nausea/vomiting and diarrhea, though patient states that she would rather have her  sinus symptoms treated and is willing to incur the risk.  We will provide with a prescription for doxycycline.  We will plan to discharge this patient.  Patient was provided with anticipatory guidance, return precautions, and educational material. Encouraged the patient to return to the emergency department at any time if they begin to experience any new or worsening symptoms.       FINAL CLINICAL IMPRESSION(S) / ED DIAGNOSES   Final diagnoses:  Nausea  Acute maxillary sinusitis, recurrence not specified     Rx / DC Orders   ED Discharge Orders          Ordered    doxycycline (VIBRA-TABS) 100 MG tablet  2 times daily        03/13/21 1721    ondansetron (ZOFRAN-ODT) 4 MG disintegrating tablet  Every 8 hours PRN        03/13/21 1721  Note:  This document was prepared using Dragon voice recognition software and may include unintentional dictation errors.   Teodoro Spray, [place] 03/13/21 1733    Blake Divine, MD 03/13/21 [phone]

## 2021-03-13 NOTE — ED Triage Notes (Signed)
Pt to ED for conitnued sx from 1/19. +dizziness, LUQ abd pain, headache, sore throat, n/v.  Reports zofran and imodium help diarrhea and emesis.

## 2021-03-21 ENCOUNTER — Other Ambulatory Visit: Payer: Self-pay | Admitting: Psychiatry

## 2021-03-21 DIAGNOSIS — F3342 Major depressive disorder, recurrent, in full remission: Secondary | ICD-10-CM

## 2021-03-23 ENCOUNTER — Telehealth: Payer: Self-pay | Admitting: Psychiatry

## 2021-03-23 DIAGNOSIS — F4310 Post-traumatic stress disorder, unspecified: Secondary | ICD-10-CM

## 2021-03-23 DIAGNOSIS — F3342 Major depressive disorder, recurrent, in full remission: Secondary | ICD-10-CM

## 2021-03-23 MED ORDER — LAMOTRIGINE 200 MG PO TABS: ORAL_TABLET | ORAL | 0 refills | Status: DC

## 2021-03-23 MED ORDER — LAMOTRIGINE 25 MG PO TABS: 25.0000 mg | ORAL_TABLET | Freq: Every day | ORAL | 0 refills | Status: DC

## 2021-03-23 NOTE — Telephone Encounter (Signed)
I have sent Lamictal to pharmacy. °

## 2021-04-07 ENCOUNTER — Telehealth: Payer: Self-pay

## 2021-04-07 NOTE — Telephone Encounter (Signed)
prior auth for zolpidem tart er 12.5mg  was proved until 02-14-22

## 2021-04-13 ENCOUNTER — Telehealth: Payer: Self-pay

## 2021-04-13 DIAGNOSIS — F410 Panic disorder [episodic paroxysmal anxiety] without agoraphobia: Secondary | ICD-10-CM

## 2021-04-13 DIAGNOSIS — F4310 Post-traumatic stress disorder, unspecified: Secondary | ICD-10-CM

## 2021-04-13 MED ORDER — HYDROXYZINE HCL 25 MG PO TABS: 12.5000 mg | ORAL_TABLET | Freq: Two times a day (BID) | ORAL | 2 refills | Status: DC | PRN

## 2021-04-13 MED ORDER — BUPROPION HCL ER (XL) 150 MG PO TB24: 300.0000 mg | ORAL_TABLET | Freq: Every day | ORAL | 2 refills | Status: DC

## 2021-04-13 MED ORDER — ZOLPIDEM TARTRATE ER 12.5 MG PO TBCR: EXTENDED_RELEASE_TABLET | ORAL | 2 refills | Status: DC

## 2021-04-13 MED ORDER — VILAZODONE HCL 40 MG PO TABS: 40.0000 mg | ORAL_TABLET | Freq: Every day | ORAL | 0 refills | Status: DC

## 2021-04-13 NOTE — Telephone Encounter (Signed)
pt left a message that she needs a refill on the wellbutrin and hydroxyzine

## 2021-04-13 NOTE — Telephone Encounter (Signed)
received fax requesting a refill of the zolpidem er 12.5mg 

## 2021-04-13 NOTE — Telephone Encounter (Signed)
pt needs refills also on the zolpidem pt is in the lobby.

## 2021-04-13 NOTE — Telephone Encounter (Signed)
I have sent her Wellbutrin, zolpidem, hydroxyzine as well as Viibryd to pharmacy for future refills.

## 2021-04-16 ENCOUNTER — Telehealth (INDEPENDENT_AMBULATORY_CARE_PROVIDER_SITE_OTHER): Payer: Medicare Other | Admitting: Psychiatry

## 2021-04-16 ENCOUNTER — Encounter: Payer: Self-pay | Admitting: Psychiatry

## 2021-04-16 ENCOUNTER — Other Ambulatory Visit: Payer: Self-pay

## 2021-04-16 DIAGNOSIS — F4310 Post-traumatic stress disorder, unspecified: Secondary | ICD-10-CM

## 2021-04-16 DIAGNOSIS — F5101 Primary insomnia: Secondary | ICD-10-CM

## 2021-04-16 DIAGNOSIS — F410 Panic disorder [episodic paroxysmal anxiety] without agoraphobia: Secondary | ICD-10-CM

## 2021-04-16 DIAGNOSIS — F330 Major depressive disorder, recurrent, mild: Secondary | ICD-10-CM | POA: Diagnosis not present

## 2021-04-16 NOTE — Progress Notes (Signed)
Virtual Visit via Video Note  I connected with [name] on 04/16/21 at  8:30 AM EST by a video enabled telemedicine application and verified that I am speaking with the correct person using two identifiers.  Location Provider Location : [facility] Patient Location : Home  Participants: Patient , Provider   I discussed the limitations of evaluation and management by telemedicine and the availability of in person appointments. The patient expressed understanding and agreed to proceed.    I discussed the assessment and treatment plan with the patient. The patient was provided an opportunity to ask questions and all were answered. The patient agreed with the plan and demonstrated an understanding of the instructions.   The patient was advised to call back or seek an in-person evaluation if the symptoms worsen or if the condition fails to improve as anticipated.    [place] MD OP Progress Note  04/16/2021 9:32 AM [name]  MRN:  [id number]  Chief Complaint:  Chief Complaint  Patient presents with   Follow-up: 46 year old Caucasian female with history of MDD, PTSD, panic disorder, insomnia presented for medication management.   HPI: [name] is a 46 year old Caucasian divorced, lives in [place], has a history of MDD, panic disorder, PTSD, primary insomnia, was evaluated by telemedicine today.  Patient today reports she is currently struggling with GI problems, reports she has left-sided abdominal pain, has been referred to GI, currently awaiting consultation.  Has not been eating well due to nausea and has lost a few pounds.   She reports her GI problems does make her sad, does have low mood on and off.  She also feels tired, that does affect her day-to-day functioning to some extent.  She however reports she is not interested in making any changes with her medications and would like to stay on her current psychotropics.  Reports sleep is good as long as she takes  the zolpidem patient denies any suicidality patient denies any other concerns today.  Currently compliant on medications like Viibryd, Wellbutrin, lamotrigine.  Denies any side effects.  Patient denies any other concerns today.  Visit Diagnosis:    ICD-10-CM   1. MDD (major depressive disorder), recurrent episode, mild ([place])  F33.0     2. Panic disorder  F41.0     3. PTSD (post-traumatic stress disorder)  F43.10     4. Primary insomnia  F51.01       Past Psychiatric History: Reviewed past psychiatric history from progress note on 05/09/2017.  Past Medical History:  Past Medical History:  Diagnosis Date   Abnormal ECG    Anemia    Anxiety    Arthritis    B12 deficiency    Cardiomyopathy ([place])    Chronic abdominal pain    Complication of anesthesia    difficulty to get sedated during endoscopy   COVID-19 01/18/2019   DDD (degenerative disc disease), lumbar    Depression    Diabetes mellitus without complication (HCC)    Dysrhythmia    GERD (gastroesophageal reflux disease)    Headache    Iron deficiency    Neuropathy    Obesity    Ovarian cyst    Pneumonia    within past five years   PTSD (post-traumatic stress disorder)    PTSD (post-traumatic stress disorder)    Rh negative status during pregnancy    Seizures ([place])    Small bowel obstruction ([place])     Past Surgical History:  Procedure Laterality Date  ABDOMINAL HYSTERECTOMY     BILATERAL SALPINGECTOMY Bilateral 09/29/2015   Procedure: BILATERAL SALPINGECTOMY;  Surgeon: Benjaman Kindler, MD;  Location: [facility] ORS;  Service: Gynecology;  Laterality: Bilateral;   bowel obstruction  2011   CHOLECYSTECTOMY     COLONOSCOPY WITH PROPOFOL N/A 01/24/2018   Procedure: COLONOSCOPY WITH PROPOFOL;  Surgeon: Toledo, Benay Pike, MD;  Location: [facility] END[facility];  Service: Gastroenterology;  Laterality: N/A;   DILATION AND CURETTAGE OF UTERUS     ESOPHAGOGASTRODUODENOSCOPY     ESOPHAGOGASTRODUODENOSCOPY (EGD) WITH PROPOFOL N/A  01/24/2018   Procedure: ESOPHAGOGASTRODUODENOSCOPY (EGD) WITH PROPOFOL;  Surgeon: Toledo, Benay Pike, MD;  Location: [facility] END[facility];  Service: Gastroenterology;  Laterality: N/A;   GASTRIC BYPASS     gi bleed  2009   Surgery-was in ICU for three weeks   HERNIA REPAIR     OVARIAN CYST REMOVAL Left 09/29/2015   Procedure: OVARIAN CYSTECTOMY;  Surgeon: Benjaman Kindler, MD;  Location: [facility] OR[facility];  Service: Gynecology;  Laterality: Left;   ROUX-EN-Y PROCEDURE     VAGINAL HYSTERECTOMY N/A 09/29/2015   Procedure: HYSTERECTOMY VAGINAL;  Surgeon: Benjaman Kindler, MD;  Location: [facility] OR[facility];  Service: Gynecology;  Laterality: N/A;    Family Psychiatric History: Reviewed family psychiatric history from progress note on 05/09/2017  Family History:  Family History  Problem Relation Age of Onset   Arthritis/Rheumatoid Mother    Heart block Mother    Clotting disorder Mother    Osteoporosis Mother    Heart attack Mother    Anxiety disorder Mother    Depression Mother    Heart disease Father    Heart attack Father    Depression Sister    Anxiety disorder Sister     Social History: Reviewed social history from progress note on 05/09/2017. Social History   Socioeconomic History   Marital status: Single    Spouse name: Not on file   Number of children: Not on file   Years of education: Not on file   Highest education level: Not on file  Occupational History   Not on file  Tobacco Use   Smoking status: Never   Smokeless tobacco: Never  Vaping Use   Vaping Use: Never used  Substance and Sexual Activity   Alcohol use: No    Alcohol/week: 0.0 standard drinks   Drug use: No   Sexual activity: Yes    Partners: Male    Birth control/protection: Surgical  Other Topics Concern   Not on file  Social History Narrative   Not on file   Social Determinants of Health   Financial Resource Strain: Not on file  Food Insecurity: Not on file  Transportation Needs: Not on file  Physical Activity: Not  on file  Stress: Not on file  Social Connections: Not on file    Allergies:  Allergies  Allergen Reactions   Amoxicillin Anaphylaxis, Hives, Shortness Of Breath, Swelling and Rash    Has patient had a PCN reaction causing immediate rash, facial/tongue/throat swelling, SOB or lightheadedness with hypotension: Yes Has patient had a PCN reaction causing severe rash involving mucus membranes or skin necrosis: Yes Has patient had a PCN reaction that required hospitalization No Has patient had a PCN reaction occurring within the last 10 years: No If all of the above answers are "NO", then may proceed with Cephalosporin use. Other reaction(s): ANAPHYLAXIS Other reaction(s): ANAPHYLAX   Penicillins Anaphylaxis, Swelling, Rash, Shortness Of Breath and Hives    Has patient had a PCN reaction causing immediate rash, facial/tongue/throat swelling,  SOB or lightheadedness with hypotension: Yes Has patient had a PCN reaction causing severe rash involving mucus membranes or skin necrosis: Yes Has patient had a PCN reaction that required hospitalization No Has patient had a PCN reaction occurring within the last 10 years: No If all of the above answers are "NO", then may proceed with Cephalosporin use.  Other reaction(s): ANAPHYLAXIS Other reaction(s): ANAPHYLAXIS Has patient had a PCN reaction causing immediate rash, facial/tongue/throat swelling, SOB or lightheadedness with hypotension: Yes Has patient had a PCN reaction causing severe rash involving mucus membranes or skin necrosis: Yes Has patient had a PCN reaction that required hospitalization No Has patient had a PCN reaction occurring within the last 10 years: No If all of the above answers are "NO", then may proceed with Cephalosporin use. Other reaction(s): ANAPHYLAXIS Other reaction(s): ANAPHYLAX Has patient had a PCN reaction causing immediate rash, facial/tongue/throat swelling, SOB or lightheadedness with hypotension: Yes Has patient  had a PCN reaction causing severe rash involving mucus membranes or skin necrosis: Yes Has patient had a PCN reaction that required hospitalization No Has patient had a PCN reaction occurring within the last 10 years: No If all of the above answers are "NO", then may proceed with Cephalosporin use. Other reaction(s): ANAPHYLAXIS Other reaction(s): ANAPHYLAXIS    Morphine Other (See Comments)    Muscle spasms   Sulfa Antibiotics Nausea Only    Other reaction(s): VOMITING Other reaction(s): VOMITING Other reaction(s): VOMITING    Metabolic Disorder Labs: Lab Results  Component Value Date   HGBA1C 5.5 12/11/2019   MPG 111 12/11/2019   Lab Results  Component Value Date   PROLACTIN 12.1 12/11/2019   Lab Results  Component Value Date   CHOL 162 12/11/2019   TRIG 51 12/11/2019   HDL 87 12/11/2019   CHOLHDL 1.9 12/11/2019   VLDL 10 12/11/2019   LDLCALC 65 12/11/2019   Lab Results  Component Value Date   TSH 1.886 12/11/2019   TSH  05/13/2009    3.163 (NOTE)  Please note change in reference ranges for ages 61W to 28Y. Test methodology is 3rd generation TSH    Therapeutic Level Labs: No results found for: LITHIUM No results found for: VALPROATE No components found for:  CBMZ  Current Medications: Current Outpatient Medications  Medication Sig Dispense Refill   cefdinir (OMNICEF) 300 MG capsule Take 2 capsules by mouth daily.     ondansetron (ZOFRAN-ODT) 4 MG disintegrating tablet Take by mouth.     pregabalin (LYRICA) 75 MG capsule Take 1 capsule at HS x 5 nights then BID x 5 days then TID thereafter     acetaminophen (TYLENOL) 325 MG tablet Take 650 mg by mouth every 4 (four) hours as needed for moderate pain.      buPROPion (WELLBUTRIN XL) 150 MG 24 hr tablet Take 2 tablets (300 mg total) by mouth daily. 60 tablet 2   cyanocobalamin (,VITAMIN B-12,) 1000 MCG/ML injection Inject 1,000 mcg into the muscle every 30 (thirty) days.      diphenoxylate-atropine (LOMOTIL)  2.5-0.025 MG tablet      ergocalciferol (VITAMIN D2) 1.25 MG (50000 UT) capsule Take by mouth.     gabapentin (NEURONTIN) 300 MG capsule Take 300 mg by mouth 2 (two) times daily.     hydrOXYzine (ATARAX) 25 MG tablet Take 0.5-1 tablets (12.5-25 mg total) by mouth 2 (two) times daily as needed. for anxiety 60 tablet 2   lamoTRIgine (LAMICTAL) 200 MG tablet TAKE 1/2 TABLET(100 MG) BY MOUTH TWICE  DAILY 90 tablet 0   lamoTRIgine (LAMICTAL) 25 MG tablet Take 1 tablet (25 mg total) by mouth daily. 90 tablet 0   loratadine (CLARITIN) 10 MG tablet Take 10 mg by mouth daily.     metoprolol succinate (TOPROL-XL) 25 MG 24 hr tablet Take by mouth. (Patient not taking: Reported on 09/29/2020)     Multiple Vitamins-Minerals (HM MULTIVITAMIN ADULT GUMMY) CHEW Chew 2 tablets by mouth daily.     nitrofurantoin, macrocrystal-monohydrate, (MACROBID) 100 MG capsule Take 1 capsule (100 mg total) by mouth 2 (two) times daily. 14 capsule 0   oxybutynin (DITROPAN-XL) 5 MG 24 hr tablet Take by mouth.     SUMAtriptan (IMITREX) 100 MG tablet Take 100 mg by mouth every 2 (two) hours as needed for migraine or headache.      thiamine (VITAMIN B-1) 100 MG tablet Take 100 mg by mouth.     topiramate (TOPAMAX) 50 MG tablet Take by mouth.     traMADol (ULTRAM) 50 MG tablet Take by mouth.     Vilazodone HCl (VIIBRYD) 40 MG TABS Take 1 tablet (40 mg total) by mouth daily. 90 tablet 0   zolpidem (AMBIEN CR) 12.5 MG CR tablet Take 1 tablet at bedtime 30 tablet 2   No current facility-administered medications for this visit.     Musculoskeletal: Strength & Muscle Tone:  UTA Gait & Station:  Seated Patient leans: N/A  Psychiatric Specialty Exam: Review of Systems  Gastrointestinal:  Positive for abdominal pain and nausea.  Psychiatric/Behavioral:  Positive for dysphoric mood.   All other systems reviewed and are negative.  There were no vitals taken for this visit.There is no height or weight on file to calculate BMI.   General Appearance: Casual  Eye Contact:  Fair  Speech:  Clear and Coherent  Volume:  Normal  Mood:  Depressed  Affect:  Congruent  Thought Process:  Goal Directed and Descriptions of Associations: Intact  Orientation:  Full (Time, Place, and Person)  Thought Content: Logical   Suicidal Thoughts:  No  Homicidal Thoughts:  No  Memory:  Immediate;   Fair Recent;   Fair Remote;   Fair  Judgement:  Fair  Insight:  Fair  Psychomotor Activity:  Normal  Concentration:  Concentration: Fair and Attention Span: Fair  Recall:  [employer] of Knowledge: Fair  Language: Fair  Akathisia:  No  Handed:  Right  AIMS (if indicated): not done  Assets:  Communication Skills Desire for Improvement Housing Social Support  ADL's:  Intact  Cognition: WNL  Sleep:  Fair   Screenings: PHQ2-9    Flowsheet Row Video Visit from 04/16/2021 in [place] Office Visit from 01/16/2021 in [place] Video Visit from 08/04/2020 in [place] Video Visit from 06/09/2020 in [place] Video Visit from 04/23/2020 in [place]  PHQ-2 Total Score 3 0 0 2 3  PHQ-9 Total Score 8 -- -- 8 7      Flowsheet Row Video Visit from 04/16/2021 in [place] ED from 03/13/2021 in [place] ED from 03/05/2021 in [place] CATEGORY No Risk No Risk No Risk        Assessment and Plan: [name] is a 46 year old Caucasian female who has a history of depression, manage currently, insomnia was evaluated by telemedicine today.  Patient is currently struggling with GI problems which does  have an impact on her mood, energy level.  Patient hence with worsening depressive symptoms likely due to her medical problems, currently not interested in dosage  readjustment of her medications.  She would benefit from the following plan.  Plan MDD -unstable Lamotrigine 225 mg p.o. daily Wellbutrin XL 300 mg p.o. daily-reduced dosage. Viibryd 40 mg p.o. daily Patient currently with symptoms of nausea, appetite changes-awaiting consultation with gastroenterology.  Once she is cleared by gastroenterology patient advised to return for medication changes as needed.  PTSD-stable Lamictal as prescribed Viibryd 40 mg p.o. daily Hydroxyzine 25 mg p.o. nightly  Panic disorder-stable Hydroxyzine 25 mg p.o. nightly as needed  Insomnia-stable Ambien CR 12.5 mg p.o. nightly Hydroxyzine was prescribed Reviewed [place] PMP aware    Reviewed notes per emergency department physician-Dr.Jessup -03/13/2021-patient presented with nausea, dizziness-labs were unremarkable.  Signs and symptoms consistent with gastroenteritis.  Patient was provided Zofran.'    Collaboration of Care: Collaboration of Care: Other patient encouraged to follow up with gastroenterology-advised to sign a release to obtain medical records.  Patient/Guardian was advised Release of Information must be obtained prior to any record release in order to collaborate their care with an outside provider. Patient/Guardian was advised if they have not already done so to contact the registration department to sign all necessary forms in order for [place] to release information regarding their care.   Consent: Patient/Guardian gives verbal consent for treatment and assignment of benefits for services provided during this visit. Patient/Guardian expressed understanding and agreed to proceed.    Follow-up in clinic in 2 months or sooner if needed.  This note was generated in part or whole with voice recognition software. Voice recognition is usually quite accurate but there are transcription errors that can and very often do occur. I apologize for any typographical errors that were not detected and  corrected.      Ursula Alert, MD 04/16/2021, 9:32 AM

## 2021-06-16 ENCOUNTER — Encounter: Payer: Self-pay | Admitting: Psychiatry

## 2021-06-16 ENCOUNTER — Telehealth (INDEPENDENT_AMBULATORY_CARE_PROVIDER_SITE_OTHER): Payer: Medicare Other | Admitting: Psychiatry

## 2021-06-16 DIAGNOSIS — F4310 Post-traumatic stress disorder, unspecified: Secondary | ICD-10-CM | POA: Diagnosis not present

## 2021-06-16 DIAGNOSIS — F410 Panic disorder [episodic paroxysmal anxiety] without agoraphobia: Secondary | ICD-10-CM | POA: Diagnosis not present

## 2021-06-16 DIAGNOSIS — F3342 Major depressive disorder, recurrent, in full remission: Secondary | ICD-10-CM

## 2021-06-16 DIAGNOSIS — F5101 Primary insomnia: Secondary | ICD-10-CM

## 2021-06-16 MED ORDER — LAMOTRIGINE 200 MG PO TABS: ORAL_TABLET | ORAL | 0 refills | Status: DC

## 2021-06-16 MED ORDER — LAMOTRIGINE 25 MG PO TABS: 25.0000 mg | ORAL_TABLET | Freq: Every day | ORAL | 0 refills | Status: DC

## 2021-06-16 MED ORDER — HYDROXYZINE HCL 25 MG PO TABS: 12.5000 mg | ORAL_TABLET | Freq: Two times a day (BID) | ORAL | 2 refills | Status: DC | PRN

## 2021-06-16 MED ORDER — ZOLPIDEM TARTRATE ER 12.5 MG PO TBCR: EXTENDED_RELEASE_TABLET | ORAL | 2 refills | Status: DC

## 2021-06-16 MED ORDER — VILAZODONE HCL 40 MG PO TABS: 40.0000 mg | ORAL_TABLET | Freq: Every day | ORAL | 0 refills | Status: DC

## 2021-06-16 MED ORDER — BUPROPION HCL ER (XL) 150 MG PO TB24: 300.0000 mg | ORAL_TABLET | Freq: Every day | ORAL | 2 refills | Status: DC

## 2021-06-16 NOTE — Progress Notes (Signed)
Virtual Visit via Video Note ? ?I connected with [name] on 06/16/21 at 10:00 AM EDT by a video enabled telemedicine application and verified that I am speaking with the correct person using two identifiers. ? ?Location ?Provider Location : [facility] ?Patient Location : Home ? ?Participants: Patient , Provider ? ?  ?I discussed the limitations of evaluation and management by telemedicine and the availability of in person appointments. The patient expressed understanding and agreed to proceed. ?  ?I discussed the assessment and treatment plan with the patient. The patient was provided an opportunity to ask questions and all were answered. The patient agreed with the plan and demonstrated an understanding of the instructions. ?  ?The patient was advised to call back or seek an in-person evaluation if the symptoms worsen or if the condition fails to improve as anticipated. ? ? ? ?[place] MD OP Progress Note ? ?06/16/2021 2:34 PM ?[name]  ?MRN:  [id number] ? ?Chief Complaint:  ?Chief Complaint  ?Patient presents with  ? Follow-up: 46 year old Caucasian female with history of MDD, PTSD, panic disorder, insomnia, presented for medication management.  ? ?HPI: [name] is a 46 year old Caucasian female, divorced, lives in [place], has a history of MDD, panic disorder, PTSD, primary insomnia was evaluated by telemedicine today. ? ?Patient today reports she currently has multiple psychosocial stressors.  She reports her father was diagnosed with stage III cancer.  Her daughter's father is also going through health problems.  That does worry her. ? ?Patient however reports she has been taking breaks and trying to handle her anxiety which has been working so far. ? ?Patient does have sleep problems, reports the Ambien does not help her to stay asleep.  She has not been taking the hydroxyzine at night.  Agreeable to start taking it.  Likely anxiety could be contributing to sleep issues as well. ? ?She is  currently taking all of her medications. ? ?Denies side effects. ? ?Not currently under the care of a therapist.  She is not interested. ? ?Denies suicidality, homicidality or perceptual disturbances. ? ?Does have GI problems and is currently under the care of gastroenterologist.  Has lost a few pounds.  Reports appetite has improved some. ? ?Patient denies any other concerns today. ? ?Visit Diagnosis:  ?  ICD-10-CM   ?1. MDD (major depressive disorder), recurrent, in full remission ([place])  F33.42 lamoTRIgine (LAMICTAL) 25 MG tablet  ?  lamoTRIgine (LAMICTAL) 200 MG tablet  ?  ?2. Panic disorder  F41.0 zolpidem (AMBIEN CR) 12.5 MG CR tablet  ?  Vilazodone HCl (VIIBRYD) 40 MG TABS  ?  hydrOXYzine (ATARAX) 25 MG tablet  ?  ?3. PTSD (post-traumatic stress disorder)  F43.10 zolpidem (AMBIEN CR) 12.5 MG CR tablet  ?  Vilazodone HCl (VIIBRYD) 40 MG TABS  ?  lamoTRIgine (LAMICTAL) 25 MG tablet  ?  buPROPion (WELLBUTRIN XL) 150 MG 24 hr tablet  ?  ?4. Primary insomnia  F51.01   ?  ? ? ?Past Psychiatric History: Reviewed past psychiatric history from progress note on 05/09/2017. ? ?Past Medical History:  ?Past Medical History:  ?Diagnosis Date  ? Abnormal ECG   ? Anemia   ? Anxiety   ? Arthritis   ? B12 deficiency   ? Cardiomyopathy ([place])   ? Chronic abdominal pain   ? Complication of anesthesia   ? difficulty to get sedated during endoscopy  ? COVID-19 01/18/2019  ? DDD (degenerative disc disease), lumbar   ? Depression   ?  Diabetes mellitus without complication ([place])   ? Dysrhythmia   ? GERD (gastroesophageal reflux disease)   ? Headache   ? Iron deficiency   ? Neuropathy   ? Obesity   ? Ovarian cyst   ? Pneumonia   ? within past five years  ? PTSD (post-traumatic stress disorder)   ? PTSD (post-traumatic stress disorder)   ? Rh negative status during pregnancy   ? Seizures ([place])   ? Small bowel obstruction ([place])   ?  ?Past Surgical History:  ?Procedure Laterality Date  ? ABDOMINAL HYSTERECTOMY    ? BILATERAL SALPINGECTOMY  Bilateral 09/29/2015  ? Procedure: BILATERAL SALPINGECTOMY;  Surgeon: Benjaman Kindler, MD;  Location: [facility];  Service: Gynecology;  Laterality: Bilateral;  ? bowel obstruction  2011  ? CHOLECYSTECTOMY    ? COLONOSCOPY WITH PROPOFOL N/A 01/24/2018  ? Procedure: COLONOSCOPY WITH PROPOFOL;  Surgeon: Toledo, Benay Pike, MD;  Location: [facility];  Service: Gastroenterology;  Laterality: N/A;  ? DILATION AND CURETTAGE OF UTERUS    ? ESOPHAGOGASTRODUODENOSCOPY    ? ESOPHAGOGASTRODUODENOSCOPY (EGD) WITH PROPOFOL N/A 01/24/2018  ? Procedure: ESOPHAGOGASTRODUODENOSCOPY (EGD) WITH PROPOFOL;  Surgeon: Toledo, Benay Pike, MD;  Location: [facility];  Service: Gastroenterology;  Laterality: N/A;  ? GASTRIC BYPASS    ? gi bleed  2009  ? Surgery-was in ICU for three weeks  ? HERNIA REPAIR    ? OVARIAN CYST REMOVAL Left 09/29/2015  ? Procedure: OVARIAN CYSTECTOMY;  Surgeon: Benjaman Kindler, MD;  Location: [facility];  Service: Gynecology;  Laterality: Left;  ? ROUX-EN-Y PROCEDURE    ? VAGINAL HYSTERECTOMY N/A 09/29/2015  ? Procedure: HYSTERECTOMY VAGINAL;  Surgeon: Benjaman Kindler, MD;  Location: [facility];  Service: Gynecology;  Laterality: N/A;  ? ? ?Family Psychiatric History: Reviewed family psychiatric history from progress note on 05/09/2017. ? ?Family History:  ?Family History  ?Problem Relation Age of Onset  ? Arthritis/Rheumatoid Mother   ? Heart block Mother   ? Clotting disorder Mother   ? Osteoporosis Mother   ? Heart attack Mother   ? Anxiety disorder Mother   ? Depression Mother   ? Heart disease Father   ? Heart attack Father   ? Depression Sister   ? Anxiety disorder Sister   ? ? ?Social History: Reviewed social history from progress note on 05/09/2017. ?Social History  ? ?Socioeconomic History  ? Marital status: Single  ?  Spouse name: Not on file  ? Number of children: Not on file  ? Years of education: Not on file  ? Highest education level: Not on file  ?Occupational History  ? Not on file  ?Tobacco Use  ?  Smoking status: Never  ? Smokeless tobacco: Never  ?Vaping Use  ? Vaping Use: Never used  ?Substance and Sexual Activity  ? Alcohol use: No  ?  Alcohol/week: 0.0 standard drinks  ? Drug use: No  ? Sexual activity: Yes  ?  Partners: Male  ?  Birth control/protection: Surgical  ?Other Topics Concern  ? Not on file  ?Social History Narrative  ? Not on file  ? ?Social Determinants of Health  ? ?Financial Resource Strain: Not on file  ?Food Insecurity: Not on file  ?Transportation Needs: Not on file  ?Physical Activity: Not on file  ?Stress: Not on file  ?Social Connections: Not on file  ? ? ?Allergies:  ?Allergies  ?Allergen Reactions  ? Amoxicillin Anaphylaxis, Hives, Shortness Of Breath, Swelling and Rash  ?  Has patient had a PCN reaction  causing immediate rash, facial/tongue/throat swelling, SOB or lightheadedness with hypotension: Yes ?Has patient had a PCN reaction causing severe rash involving mucus membranes or skin necrosis: Yes ?Has patient had a PCN reaction that required hospitalization No ?Has patient had a PCN reaction occurring within the last 10 years: No ?If all of the above answers are "NO", then may proceed with Cephalosporin use. ?Other reaction(s): ANAPHYLAXIS ?Other reaction(s): ANAPHYLAX  ? Penicillins Anaphylaxis, Swelling, Rash, Shortness Of Breath and Hives  ?  Has patient had a PCN reaction causing immediate rash, facial/tongue/throat swelling, SOB or lightheadedness with hypotension: Yes ?Has patient had a PCN reaction causing severe rash involving mucus membranes or skin necrosis: Yes ?Has patient had a PCN reaction that required hospitalization No ?Has patient had a PCN reaction occurring within the last 10 years: No ?If all of the above answers are "NO", then may proceed with Cephalosporin use. ? ?Other reaction(s): ANAPHYLAXIS ?Other reaction(s): ANAPHYLAXIS ?Has patient had a PCN reaction causing immediate rash, facial/tongue/throat swelling, SOB or lightheadedness with hypotension:  Yes ?Has patient had a PCN reaction causing severe rash involving mucus membranes or skin necrosis: Yes ?Has patient had a PCN reaction that required hospitalization No ?Has patient had a PCN reaction occurrin

## 2021-06-18 ENCOUNTER — Encounter: Payer: Self-pay | Admitting: Physician Assistant

## 2021-06-25 ENCOUNTER — Other Ambulatory Visit: Payer: Self-pay

## 2021-06-25 DIAGNOSIS — D509 Iron deficiency anemia, unspecified: Secondary | ICD-10-CM

## 2021-06-28 NOTE — Progress Notes (Deleted)
[place]  [phone] [phone]) [phone]  ID: [name] OB: [birth year]  MR#: [id number]  YSA#:[id number]  Patient Care Team: Leonard Downing, MD as PCP - General (Family Medicine)  CHIEF COMPLAINT: Iron deficiency anemia.   INTERVAL HISTORY: Patient was last seen in clinic in September 2018.  She returns today with complaints of increasing weakness and fatigue as well as pica eating more ice.  She otherwise feels well.  She has no neurologic complaints.  She denies any recent fevers or illnesses.  She has a good appetite and denies weight loss.  She has no chest pain, shortness of breath, cough, or hemoptysis.  She denies any nausea, vomiting, constipation, or diarrhea.  She has no melena or hematochezia.  She has no urinary complaints.  Patient offers no further specific complaints today.  REVIEW OF SYSTEMS:   Review of Systems  Constitutional:  Positive for malaise/fatigue. Negative for fever and weight loss.  Respiratory: Negative.  Negative for cough and shortness of breath.   Cardiovascular: Negative.  Negative for chest pain and leg swelling.  Gastrointestinal: Negative.  Negative for abdominal pain, blood in stool and melena.  Genitourinary: Negative.  Negative for hematuria.  Musculoskeletal: Negative.  Negative for back pain, joint pain and neck pain.  Skin: Negative.  Negative for rash.  Neurological:  Positive for weakness. Negative for dizziness.  Psychiatric/Behavioral: Negative.  Negative for depression. The patient is not nervous/anxious.    As per HPI. Otherwise, a complete review of systems is negative.  PAST MEDICAL HISTORY: Past Medical History:  Diagnosis Date   Abnormal ECG    Anemia    Anxiety    Arthritis    B12 deficiency    Cardiomyopathy ([place])    Chronic abdominal pain    Complication of anesthesia    difficulty to get sedated during endoscopy   COVID-19 01/18/2019   DDD (degenerative disc disease),  lumbar    Depression    Diabetes mellitus without complication (HCC)    Dysrhythmia    GERD (gastroesophageal reflux disease)    Headache    Iron deficiency    Neuropathy    Obesity    Ovarian cyst    Pneumonia    within past five years   PTSD (post-traumatic stress disorder)    PTSD (post-traumatic stress disorder)    Rh negative status during pregnancy    Seizures ([place])    Small bowel obstruction ([place])     PAST SURGICAL HISTORY: Past Surgical History:  Procedure Laterality Date   ABDOMINAL HYSTERECTOMY     BILATERAL SALPINGECTOMY Bilateral 09/29/2015   Procedure: BILATERAL SALPINGECTOMY;  Surgeon: Benjaman Kindler, MD;  Location: [facility];  Service: Gynecology;  Laterality: Bilateral;   bowel obstruction  2011   CHOLECYSTECTOMY     COLONOSCOPY WITH PROPOFOL N/A 01/24/2018   Procedure: COLONOSCOPY WITH PROPOFOL;  Surgeon: Toledo, Benay Pike, MD;  Location: [facility];  Service: Gastroenterology;  Laterality: N/A;   DILATION AND CURETTAGE OF UTERUS     ESOPHAGOGASTRODUODENOSCOPY     ESOPHAGOGASTRODUODENOSCOPY (EGD) WITH PROPOFOL N/A 01/24/2018   Procedure: ESOPHAGOGASTRODUODENOSCOPY (EGD) WITH PROPOFOL;  Surgeon: Toledo, Benay Pike, MD;  Location: [facility];  Service: Gastroenterology;  Laterality: N/A;   GASTRIC BYPASS     gi bleed  2009   Surgery-was in ICU for three weeks   HERNIA REPAIR     OVARIAN CYST REMOVAL Left 09/29/2015   Procedure: OVARIAN CYSTECTOMY;  Surgeon: Benjaman Kindler, MD;  Location: [facility];  Service:  Gynecology;  Laterality: Left;   ROUX-EN-Y PROCEDURE     VAGINAL HYSTERECTOMY N/A 09/29/2015   Procedure: HYSTERECTOMY VAGINAL;  Surgeon: Benjaman Kindler, MD;  Location: [facility];  Service: Gynecology;  Laterality: N/A;    FAMILY HISTORY Family History  Problem Relation Age of Onset   Arthritis/Rheumatoid Mother    Heart block Mother    Clotting disorder Mother    Osteoporosis Mother    Heart attack Mother    Anxiety disorder Mother     Depression Mother    Heart disease Father    Heart attack Father    Depression Sister    Anxiety disorder Sister        ADVANCED DIRECTIVES:    HEALTH MAINTENANCE: Social History   Tobacco Use   Smoking status: Never   Smokeless tobacco: Never  Vaping Use   Vaping Use: Never used  Substance Use Topics   Alcohol use: No    Alcohol/week: 0.0 standard drinks   Drug use: No     Colonoscopy:  PAP:  Bone density:  Lipid panel:  Allergies  Allergen Reactions   Amoxicillin Anaphylaxis, Hives, Shortness Of Breath, Swelling and Rash    Has patient had a PCN reaction causing immediate rash, facial/tongue/throat swelling, SOB or lightheadedness with hypotension: Yes Has patient had a PCN reaction causing severe rash involving mucus membranes or skin necrosis: Yes Has patient had a PCN reaction that required hospitalization No Has patient had a PCN reaction occurring within the last 10 years: No If all of the above answers are "NO", then may proceed with Cephalosporin use. Other reaction(s): ANAPHYLAXIS Other reaction(s): ANAPHYLAX   Penicillins Anaphylaxis, Swelling, Rash, Shortness Of Breath and Hives    Has patient had a PCN reaction causing immediate rash, facial/tongue/throat swelling, SOB or lightheadedness with hypotension: Yes Has patient had a PCN reaction causing severe rash involving mucus membranes or skin necrosis: Yes Has patient had a PCN reaction that required hospitalization No Has patient had a PCN reaction occurring within the last 10 years: No If all of the above answers are "NO", then may proceed with Cephalosporin use.  Other reaction(s): ANAPHYLAXIS Other reaction(s): ANAPHYLAXIS Has patient had a PCN reaction causing immediate rash, facial/tongue/throat swelling, SOB or lightheadedness with hypotension: Yes Has patient had a PCN reaction causing severe rash involving mucus membranes or skin necrosis: Yes Has patient had a PCN reaction that required  hospitalization No Has patient had a PCN reaction occurring within the last 10 years: No If all of the above answers are "NO", then may proceed with Cephalosporin use. Other reaction(s): ANAPHYLAXIS Other reaction(s): ANAPHYLAX Has patient had a PCN reaction causing immediate rash, facial/tongue/throat swelling, SOB or lightheadedness with hypotension: Yes Has patient had a PCN reaction causing severe rash involving mucus membranes or skin necrosis: Yes Has patient had a PCN reaction that required hospitalization No Has patient had a PCN reaction occurring within the last 10 years: No If all of the above answers are "NO", then may proceed with Cephalosporin use. Other reaction(s): ANAPHYLAXIS Other reaction(s): ANAPHYLAXIS    Morphine Other (See Comments)    Muscle spasms   Sulfa Antibiotics Nausea Only    Other reaction(s): VOMITING Other reaction(s): VOMITING Other reaction(s): VOMITING    Current Outpatient Medications  Medication Sig Dispense Refill   acetaminophen (TYLENOL) 325 MG tablet Take 650 mg by mouth every 4 (four) hours as needed for moderate pain.      buPROPion (WELLBUTRIN XL) 150 MG 24 hr tablet Take 2  tablets (300 mg total) by mouth daily. 60 tablet 2   cefdinir (OMNICEF) 300 MG capsule Take 2 capsules by mouth daily.     cyanocobalamin (,VITAMIN B-12,) 1000 MCG/ML injection Inject 1,000 mcg into the muscle every 30 (thirty) days.      diphenoxylate-atropine (LOMOTIL) 2.5-0.025 MG tablet      ergocalciferol (VITAMIN D2) 1.25 MG (50000 UT) capsule Take by mouth.     hydrOXYzine (ATARAX) 25 MG tablet Take 0.5-1 tablets (12.5-25 mg total) by mouth 2 (two) times daily as needed. for anxiety 60 tablet 2   lamoTRIgine (LAMICTAL) 200 MG tablet TAKE 1/2 TABLET(100 MG) BY MOUTH TWICE DAILY 90 tablet 0   lamoTRIgine (LAMICTAL) 25 MG tablet Take 1 tablet (25 mg total) by mouth daily. 90 tablet 0   loratadine (CLARITIN) 10 MG tablet Take 10 mg by mouth daily.     metoprolol  succinate (TOPROL-XL) 25 MG 24 hr tablet Take by mouth. (Patient not taking: Reported on 09/29/2020)     Multiple Vitamins-Minerals (HM MULTIVITAMIN ADULT GUMMY) CHEW Chew 2 tablets by mouth daily.     nitrofurantoin, macrocrystal-monohydrate, (MACROBID) 100 MG capsule Take 1 capsule (100 mg total) by mouth 2 (two) times daily. 14 capsule 0   ondansetron (ZOFRAN-ODT) 4 MG disintegrating tablet Take by mouth.     oxybutynin (DITROPAN-XL) 5 MG 24 hr tablet Take by mouth.     pregabalin (LYRICA) 75 MG capsule Take 1 capsule at HS x 5 nights then BID x 5 days then TID thereafter     SUMAtriptan (IMITREX) 100 MG tablet Take 100 mg by mouth every 2 (two) hours as needed for migraine or headache.      thiamine (VITAMIN B-1) 100 MG tablet Take 100 mg by mouth.     topiramate (TOPAMAX) 50 MG tablet Take by mouth.     traMADol (ULTRAM) 50 MG tablet Take by mouth.     Vilazodone HCl (VIIBRYD) 40 MG TABS Take 1 tablet (40 mg total) by mouth daily. 90 tablet 0   zolpidem (AMBIEN CR) 12.5 MG CR tablet Take 1 tablet at bedtime 30 tablet 2   No current facility-administered medications for this visit.    OBJECTIVE: There were no vitals filed for this visit.    There is no height or weight on file to calculate BMI.    ECOG FS:0 - Asymptomatic  General: Well-developed, well-nourished, no acute distress. Eyes: Pink conjunctiva, anicteric sclera. HEENT: Normocephalic, moist mucous membranes. Lungs: No audible wheezing or coughing. Heart: Regular rate and rhythm. Abdomen: Soft, nontender, no obvious distention. Musculoskeletal: No edema, cyanosis, or clubbing. Neuro: Alert, answering all questions appropriately. Cranial nerves grossly intact. Skin: No rashes or petechiae noted. Psych: Normal affect.   LAB RESULTS:  Lab Results  Component Value Date   NA 139 03/13/2021   K 4.1 03/13/2021   CL 105 03/13/2021   CO2 25 03/13/2021   GLUCOSE 89 03/13/2021   BUN 11 03/13/2021   CREATININE 0.85  03/13/2021   CALCIUM 9.5 03/13/2021   PROT 7.4 04/26/2018   ALBUMIN 4.1 04/26/2018   AST 17 04/26/2018   ALT 18 04/26/2018   ALKPHOS 32 (L) 04/26/2018   BILITOT 0.9 04/26/2018   GFRNONAA >60 03/13/2021   GFRAA >60 04/26/2018    Lab Results  Component Value Date   WBC 8.4 03/13/2021   NEUTROABS 7.6 03/03/2021   HGB 14.0 03/13/2021   HCT 43.8 03/13/2021   MCV 91.8 03/13/2021   PLT 270 03/13/2021   Lab  Results  Component Value Date   FERRITIN 60 03/03/2021   Lab Results  Component Value Date   IRON 55 03/03/2021   TIBC 392 03/03/2021   IRONPCTSAT 14 03/03/2021      STUDIES: No results found.  ASSESSMENT: Iron deficiency anemia.  PLAN:    1. 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  Patient's hemoglobin is only mildly decreased at 11.6, but her iron stores are significantly reduced and she is symptomatic.  Proceed with 510 mg IV Feraheme today.  Return to clinic in 1 week for second infusion.  Patient will then return to clinic in 4 months with repeat laboratory work, further evaluation, and continuation of treatment if needed.  2. Rheumatoid arthritis: Continued workup to rheumatology. 3. Depression/anxiety:  Continue current medications as prescribed.  I spent a total of 30 minutes reviewing chart data, face-to-face evaluation with the patient, counseling and coordination of care as detailed above.   Patient expressed understanding and was in agreement with this plan. She also understands that She can call clinic at any time with any questions, concerns, or complaints.     Lloyd Huger, MD   06/28/2021 7:01 PM

## 2021-06-29 ENCOUNTER — Inpatient Hospital Stay: Payer: Medicare Other

## 2021-06-30 ENCOUNTER — Inpatient Hospital Stay: Payer: Medicare Other

## 2021-06-30 ENCOUNTER — Inpatient Hospital Stay: Payer: Medicare Other | Admitting: Oncology

## 2021-07-02 ENCOUNTER — Inpatient Hospital Stay: Payer: Medicare Other | Attending: Oncology

## 2021-07-02 DIAGNOSIS — D509 Iron deficiency anemia, unspecified: Secondary | ICD-10-CM | POA: Insufficient documentation

## 2021-07-02 LAB — CBC WITH DIFFERENTIAL/PLATELET
Abs Immature Granulocytes: 0.03 10*3/uL (ref 0.00–0.07)
Basophils Absolute: 0 10*3/uL (ref 0.0–0.1)
Basophils Relative: 1 %
Eosinophils Absolute: 0.1 10*3/uL (ref 0.0–0.5)
Eosinophils Relative: 2 %
HCT: 42.4 % (ref 36.0–46.0)
Hemoglobin: 13.8 g/dL (ref 12.0–15.0)
Immature Granulocytes: 1 %
Lymphocytes Relative: 24 %
Lymphs Abs: 1.5 10*3/uL (ref 0.7–4.0)
MCH: 30.7 pg (ref 26.0–34.0)
MCHC: 32.5 g/dL (ref 30.0–36.0)
MCV: 94.2 fL (ref 80.0–100.0)
Monocytes Absolute: 0.5 10*3/uL (ref 0.1–1.0)
Monocytes Relative: 7 %
Neutro Abs: 4.2 10*3/uL (ref 1.7–7.7)
Neutrophils Relative %: 65 %
Platelets: 313 10*3/uL (ref 150–400)
RBC: 4.5 MIL/uL (ref 3.87–5.11)
RDW: 12.9 % (ref 11.5–15.5)
WBC: 6.4 10*3/uL (ref 4.0–10.5)
nRBC: 0 % (ref 0.0–0.2)

## 2021-07-02 LAB — IRON AND TIBC
Iron: 128 ug/dL (ref 28–170)
Saturation Ratios: 32 % — ABNORMAL HIGH (ref 10.4–31.8)
TIBC: 395 ug/dL (ref 250–450)
UIBC: 267 ug/dL

## 2021-07-02 LAB — FERRITIN: Ferritin: 24 ng/mL (ref 11–307)

## 2021-07-03 ENCOUNTER — Ambulatory Visit: Payer: Medicare Other | Admitting: Physician Assistant

## 2021-07-03 ENCOUNTER — Inpatient Hospital Stay: Payer: Medicare Other

## 2021-07-15 ENCOUNTER — Other Ambulatory Visit: Payer: Self-pay | Admitting: Neurology

## 2021-07-15 DIAGNOSIS — G3184 Mild cognitive impairment, so stated: Secondary | ICD-10-CM

## 2021-07-27 ENCOUNTER — Ambulatory Visit
Admission: RE | Admit: 2021-07-27 | Discharge: 2021-07-27 | Disposition: A | Discharge status: Home / Self Care | Payer: Medicare Other | Source: Ambulatory Visit | Attending: Neurology | Admitting: Neurology

## 2021-07-27 DIAGNOSIS — G3184 Mild cognitive impairment, so stated: Secondary | ICD-10-CM | POA: Insufficient documentation

## 2021-08-05 DIAGNOSIS — M5412 Radiculopathy, cervical region: Secondary | ICD-10-CM | POA: Insufficient documentation

## 2021-08-12 NOTE — Progress Notes (Deleted)
08/12/2021 [name] [id number] 11-13-1975  Referring provider: Leonard Downing, * Primary GI doctor: {acdocs:27040} (previously saw [facility] Dr. Alice Reichert 2019)  ASSESSMENT AND PLAN:   There are no diagnoses linked to this encounter.   Patient Care Team: Leonard Downing, MD as PCP - General (Family Medicine)  HISTORY OF PRESENT ILLNESS: 46 y.o. female with a past medical history of Roux-en-Y gastric bypass 2009, history of GI bleed and small bowel obstruction 2010 mother with adenomatous colon polyps.  Status post hysterectomy, cholecystectomy, history of reflux B12 deficiency, depression/anxiety, type 2 diabetes, and others listed below presents for evaluation of weight loss, early satiety, diarrhea.   12/12/2017 office visit with [facility] for fecal incontinence 01/24/2018 colonoscopy and upper endoscopy with Dr. Alice Reichert at [place] [facility] Colonoscopy showed redundant colon, bowel prep, normal rectal exam, normal sphincter tone mildly tortuous but normal colon pathology negative for microscopic colitis, dysplasia or malignancy nonbleeding internal hemorrhoids grade 1 endoscopy showed evidence of Roux-en-Y, anastomosis healthy-appearing mucosa normal esophagus normal jejunum.  Pathology negative for celiac, dysplasia and malignancy 03/05/2021 T abdomen pelvis with contrast left-sided pain nausea emesis and diarrhea.  Postsurgical changes of gastric bypass, no bowel obstruction no bowel changes no acute findings.  Did show moderate distention of distal sigmoid colon and rectum with air.  Normal pancreas, normal spleen smooth liver contours with focal fatty deposition, status postcholecystectomy Lari ductal dilatation Current Medications:    Current Outpatient Medications (Cardiovascular):    metoprolol succinate (TOPROL-XL) 25 MG 24 hr tablet, Take by mouth. (Patient not taking: Reported on 09/29/2020)  Current Outpatient Medications (Respiratory):     loratadine (CLARITIN) 10 MG tablet, Take 10 mg by mouth daily.  Current Outpatient Medications (Analgesics):    acetaminophen (TYLENOL) 325 MG tablet, Take 650 mg by mouth every 4 (four) hours as needed for moderate pain.    SUMAtriptan (IMITREX) 100 MG tablet, Take 100 mg by mouth every 2 (two) hours as needed for migraine or headache.    traMADol (ULTRAM) 50 MG tablet, Take by mouth.  Current Outpatient Medications (Hematological):    cyanocobalamin (,VITAMIN B-12,) 1000 MCG/ML injection, Inject 1,000 mcg into the muscle every 30 (thirty) days.   Current Outpatient Medications (Other):    buPROPion (WELLBUTRIN XL) 150 MG 24 hr tablet, Take 2 tablets (300 mg total) by mouth daily.   cefdinir (OMNICEF) 300 MG capsule, Take 2 capsules by mouth daily.   diphenoxylate-atropine (LOMOTIL) 2.5-0.025 MG tablet,    ergocalciferol (VITAMIN D2) 1.25 MG (50000 UT) capsule, Take by mouth.   hydrOXYzine (ATARAX) 25 MG tablet, Take 0.5-1 tablets (12.5-25 mg total) by mouth 2 (two) times daily as needed. for anxiety   lamoTRIgine (LAMICTAL) 200 MG tablet, TAKE 1/2 TABLET(100 MG) BY MOUTH TWICE DAILY   lamoTRIgine (LAMICTAL) 25 MG tablet, Take 1 tablet (25 mg total) by mouth daily.   Multiple Vitamins-Minerals (HM MULTIVITAMIN ADULT GUMMY) CHEW, Chew 2 tablets by mouth daily.   nitrofurantoin, macrocrystal-monohydrate, (MACROBID) 100 MG capsule, Take 1 capsule (100 mg total) by mouth 2 (two) times daily.   ondansetron (ZOFRAN-ODT) 4 MG disintegrating tablet, Take by mouth.   oxybutynin (DITROPAN-XL) 5 MG 24 hr tablet, Take by mouth.   pregabalin (LYRICA) 75 MG capsule, Take 1 capsule at HS x 5 nights then BID x 5 days then TID thereafter   thiamine (VITAMIN B-1) 100 MG tablet, Take 100 mg by mouth.   topiramate (TOPAMAX) 50 MG tablet, Take by mouth.   Vilazodone HCl (  VIIBRYD) 40 MG TABS, Take 1 tablet (40 mg total) by mouth daily.   zolpidem (AMBIEN CR) 12.5 MG CR tablet, Take 1 tablet at  bedtime  Medical History:  Past Medical History:  Diagnosis Date   Abnormal ECG    Anemia    Anxiety    Arthritis    B12 deficiency    Cardiomyopathy ([place])    Chronic abdominal pain    Complication of anesthesia    difficulty to get sedated during endoscopy   COVID-19 01/18/2019   DDD (degenerative disc disease), lumbar    Depression    Diabetes mellitus without complication (HCC)    Dysrhythmia    GERD (gastroesophageal reflux disease)    Headache    Iron deficiency    Neuropathy    Obesity    Ovarian cyst    Pneumonia    within past five years   PTSD (post-traumatic stress disorder)    PTSD (post-traumatic stress disorder)    Rh negative status during pregnancy    Seizures ([place])    Small bowel obstruction (HCC)    Allergies:  Allergies  Allergen Reactions   Amoxicillin Anaphylaxis, Hives, Shortness Of Breath, Swelling and Rash    Has patient had a PCN reaction causing immediate rash, facial/tongue/throat swelling, SOB or lightheadedness with hypotension: Yes Has patient had a PCN reaction causing severe rash involving mucus membranes or skin necrosis: Yes Has patient had a PCN reaction that required hospitalization No Has patient had a PCN reaction occurring within the last 10 years: No If all of the above answers are "NO", then may proceed with Cephalosporin use. Other reaction(s): ANAPHYLAXIS Other reaction(s): ANAPHYLAX   Penicillins Anaphylaxis, Swelling, Rash, Shortness Of Breath and Hives    Has patient had a PCN reaction causing immediate rash, facial/tongue/throat swelling, SOB or lightheadedness with hypotension: Yes Has patient had a PCN reaction causing severe rash involving mucus membranes or skin necrosis: Yes Has patient had a PCN reaction that required hospitalization No Has patient had a PCN reaction occurring within the last 10 years: No If all of the above answers are "NO", then may proceed with Cephalosporin use.  Other reaction(s):  ANAPHYLAXIS Other reaction(s): ANAPHYLAXIS Has patient had a PCN reaction causing immediate rash, facial/tongue/throat swelling, SOB or lightheadedness with hypotension: Yes Has patient had a PCN reaction causing severe rash involving mucus membranes or skin necrosis: Yes Has patient had a PCN reaction that required hospitalization No Has patient had a PCN reaction occurring within the last 10 years: No If all of the above answers are "NO", then may proceed with Cephalosporin use. Other reaction(s): ANAPHYLAXIS Other reaction(s): ANAPHYLAX Has patient had a PCN reaction causing immediate rash, facial/tongue/throat swelling, SOB or lightheadedness with hypotension: Yes Has patient had a PCN reaction causing severe rash involving mucus membranes or skin necrosis: Yes Has patient had a PCN reaction that required hospitalization No Has patient had a PCN reaction occurring within the last 10 years: No If all of the above answers are "NO", then may proceed with Cephalosporin use. Other reaction(s): ANAPHYLAXIS Other reaction(s): ANAPHYLAXIS    Morphine Other (See Comments)    Muscle spasms   Sulfa Antibiotics Nausea Only    Other reaction(s): VOMITING Other reaction(s): VOMITING Other reaction(s): VOMITING     Surgical History:  She  has a past surgical history that includes Gastric bypass; Cholecystectomy; Hernia repair; gi bleed (2009); bowel obstruction (2011); Vaginal hysterectomy (N/A, 09/29/2015); Bilateral salpingectomy (Bilateral, 09/29/2015); Ovarian cyst removal (Left, 09/29/2015); Abdominal hysterectomy;  Dilation and curettage of uterus; Esophagogastroduodenoscopy; Roux-en-y procedure; Esophagogastroduodenoscopy (egd) with propofol (N/A, 01/24/2018); and Colonoscopy with propofol (N/A, 01/24/2018). Family History:  Her family history includes Anxiety disorder in her mother and sister; Arthritis/Rheumatoid in her mother; Clotting disorder in her mother; Depression in her mother and  sister; Heart attack in her father and mother; Heart block in her mother; Heart disease in her father; Osteoporosis in her mother. Social History:   reports that she has never smoked. She has never used smokeless tobacco. She reports that she does not drink alcohol and does not use drugs.  REVIEW OF SYSTEMS  : All other systems reviewed and negative except where noted in the History of Present Illness.   PHYSICAL EXAM: LMP  (LMP Unknown)  General:   Pleasant, well developed female in no acute distress Head:   Normocephalic and atraumatic. Eyes:  {sclerae:26738},conjunctive {conjuctiva:26739}  Heart:   {HEART EXAM HEM/ONC:21750} Pulm:  Clear anteriorly; no wheezing Abdomen:   {BlankSingle:19197::"Distended","Ridged","Soft"}, {BlankSingle:19197::"Flat","Obese","Non-distended"} AB, {BlankSingle:19197::"Absent","Hyperactive, tinkling","Hypoactive","Sluggish","Active"} bowel sounds. {actendernessAB:27319} tenderness {anatomy; site abdomen:5010}. {BlankMultiple:19196::"Without guarding","With guarding","Without rebound","With rebound"}, No organomegaly appreciated. Rectal: {acrectalexam:27461} Extremities:  {With/Without:304960234} edema. Msk: Symmetrical without gross deformities. Peripheral pulses intact.  Neurologic:  Alert and  oriented x4;  No focal deficits.  Skin:   Dry and intact without significant lesions or rashes. Psychiatric:  Cooperative. Normal mood and affect.    Vladimir Crofts, PA-C 11:54 AM

## 2021-08-14 ENCOUNTER — Ambulatory Visit: Payer: Medicare Other | Admitting: Physician Assistant

## 2021-09-09 NOTE — Progress Notes (Deleted)
09/09/2021 [name] [id number] [birth year]  Referring provider: Leonard Downing, * Primary GI doctor: {acdocs:[id number]}  ASSESSMENT AND PLAN:   There are no diagnoses linked to this encounter.  ?SIBO versus trial of pancreatic enzyme Patient Care Team: Leonard Downing, MD as PCP - General (Family Medicine)  HISTORY OF PRESENT ILLNESS: 46 y.o. female with a past medical history of cardiomyopathy, type 2 diabetes, PTSD, iron deficiency anemia and others listed below presents for evaluation of weight loss, decreased appetite, diarrhea.  01/24/[facility]. Alice Reichert for functional diarrhea, fecal incontinence  Colonoscopy difficult due to redundant colon, good bowel prep normal sphincter tone, normal colonoscopy nonbleeding internal hemorrhoids grade 1 biopsies unremarkable. Endoscopy shows evidence of Roux-en-Y gastrojejunostomy healthy-appearing anastomosis, normal examined jejunum 03/05/2021 CT abdomen pelvis with contrast for left-sided abdominal pain, nausea, emesis and diarrhea history of multiple surgeries evaluate for obstruction showed postsurgical changes of gastric bypass no evidence of bowel obstruction or inflammatory bowel changes no acute abnormality.  Focal fatty disposition of the liver status postcholecystectomy without biliary dilatation.  Normal pancreas  07/15/2021 Labs reviewed show iron 128, saturation slightly increased at 32, ferritin normal at 24, normal CBC without anemia or leukocytosis.  Negative HIV, A1c 5.6, folate normal at 7.7, B12 very low at 80, normal thyroid at 1.4 sed rate normal. Patient did have a low ferritin 12/21/2019 iron was low normal at 30 with decreased saturations.  Current Medications:    Current Outpatient Medications (Cardiovascular):    metoprolol succinate (TOPROL-XL) 25 MG 24 hr tablet, Take by mouth. (Patient not taking: Reported on 09/29/2020)  Current  Outpatient Medications (Respiratory):    loratadine (CLARITIN) 10 MG tablet, Take 10 mg by mouth daily.  Current Outpatient Medications (Analgesics):    acetaminophen (TYLENOL) 325 MG tablet, Take 650 mg by mouth every 4 (four) hours as needed for moderate pain.    SUMAtriptan (IMITREX) 100 MG tablet, Take 100 mg by mouth every 2 (two) hours as needed for migraine or headache.    traMADol (ULTRAM) 50 MG tablet, Take by mouth.  Current Outpatient Medications (Hematological):    cyanocobalamin (,VITAMIN B-12,) 1000 MCG/ML injection, Inject 1,000 mcg into the muscle every 30 (thirty) days.   Current Outpatient Medications (Other):    buPROPion (WELLBUTRIN XL) 150 MG 24 hr tablet, Take 2 tablets (300 mg total) by mouth daily.   cefdinir (OMNICEF) 300 MG capsule, Take 2 capsules by mouth daily.   diphenoxylate-atropine (LOMOTIL) 2.5-0.025 MG tablet,    ergocalciferol (VITAMIN D2) 1.25 MG (50000 UT) capsule, Take by mouth.   hydrOXYzine (ATARAX) 25 MG tablet, Take 0.5-1 tablets (12.5-25 mg total) by mouth 2 (two) times daily as needed. for anxiety   lamoTRIgine (LAMICTAL) 200 MG tablet, TAKE 1/2 TABLET(100 MG) BY MOUTH TWICE DAILY   lamoTRIgine (LAMICTAL) 25 MG tablet, Take 1 tablet (25 mg total) by mouth daily.   Multiple Vitamins-Minerals (HM MULTIVITAMIN ADULT GUMMY) CHEW, Chew 2 tablets by mouth daily.   nitrofurantoin, macrocrystal-monohydrate, (MACROBID) 100 MG capsule, Take 1 capsule (100 mg total) by mouth 2 (two) times daily.   ondansetron (ZOFRAN-ODT) 4 MG disintegrating tablet, Take by mouth.   oxybutynin (DITROPAN-XL) 5 MG 24 hr tablet, Take by mouth.   pregabalin (LYRICA) 75 MG capsule, Take 1 capsule at HS x 5 nights then BID x 5 days then TID thereafter   thiamine (VITAMIN B-1) 100 MG tablet, Take 100 mg by mouth.   topiramate (TOPAMAX) 50 MG  tablet, Take by mouth.   Vilazodone HCl (VIIBRYD) 40 MG TABS, Take 1 tablet (40 mg total) by mouth daily.   zolpidem (AMBIEN CR) 12.5 MG CR  tablet, Take 1 tablet at bedtime  Medical History:  Past Medical History:  Diagnosis Date   Abnormal ECG    Anemia    Anxiety    Arthritis    Asthma    B12 deficiency    Cardiomyopathy ([place])    Chronic abdominal pain    Complication of anesthesia    difficulty to get sedated during endoscopy   COVID-19 01/18/2019   DDD (degenerative disc disease), lumbar    Depression    Diabetes mellitus without complication (HCC)    Dysrhythmia    GERD (gastroesophageal reflux disease)    Iron deficiency    Migraine headache    Neuropathy    Obesity    Ovarian cyst    Pneumonia    within past five years   PTSD (post-traumatic stress disorder)    PTSD (post-traumatic stress disorder)    Rh negative status during pregnancy    Seizures ([place])    Small bowel obstruction (HCC)    Allergies:  Allergies  Allergen Reactions   Amoxicillin Anaphylaxis, Hives, Shortness Of Breath, Swelling and Rash    Has patient had a PCN reaction causing immediate rash, facial/tongue/throat swelling, SOB or lightheadedness with hypotension: Yes Has patient had a PCN reaction causing severe rash involving mucus membranes or skin necrosis: Yes Has patient had a PCN reaction that required hospitalization No Has patient had a PCN reaction occurring within the last 10 years: No If all of the above answers are "NO", then may proceed with Cephalosporin use. Other reaction(s): ANAPHYLAXIS Other reaction(s): ANAPHYLAX   Penicillins Anaphylaxis, Swelling, Rash, Shortness Of Breath and Hives    Has patient had a PCN reaction causing immediate rash, facial/tongue/throat swelling, SOB or lightheadedness with hypotension: Yes Has patient had a PCN reaction causing severe rash involving mucus membranes or skin necrosis: Yes Has patient had a PCN reaction that required hospitalization No Has patient had a PCN reaction occurring within the last 10 years: No If all of the above answers are "NO", then may proceed with  Cephalosporin use.  Other reaction(s): ANAPHYLAXIS Other reaction(s): ANAPHYLAXIS Has patient had a PCN reaction causing immediate rash, facial/tongue/throat swelling, SOB or lightheadedness with hypotension: Yes Has patient had a PCN reaction causing severe rash involving mucus membranes or skin necrosis: Yes Has patient had a PCN reaction that required hospitalization No Has patient had a PCN reaction occurring within the last 10 years: No If all of the above answers are "NO", then may proceed with Cephalosporin use. Other reaction(s): ANAPHYLAXIS Other reaction(s): ANAPHYLAX Has patient had a PCN reaction causing immediate rash, facial/tongue/throat swelling, SOB or lightheadedness with hypotension: Yes Has patient had a PCN reaction causing severe rash involving mucus membranes or skin necrosis: Yes Has patient had a PCN reaction that required hospitalization No Has patient had a PCN reaction occurring within the last 10 years: No If all of the above answers are "NO", then may proceed with Cephalosporin use. Other reaction(s): ANAPHYLAXIS Other reaction(s): ANAPHYLAXIS    Morphine Other (See Comments)    Muscle spasms   Sulfa Antibiotics Nausea Only    Other reaction(s): VOMITING Other reaction(s): VOMITING Other reaction(s): VOMITING     Surgical History:  She  has a past surgical history that includes Gastric bypass (2010); Cholecystectomy; Hernia repair; gi bleed (2009); bowel obstruction (2011); Vaginal  hysterectomy (N/A, 09/29/2015); Bilateral salpingectomy (Bilateral, 09/29/2015); Ovarian cyst removal (Left, 09/29/2015); Abdominal hysterectomy; Dilation and curettage of uterus; Esophagogastroduodenoscopy; Roux-en-y procedure; Esophagogastroduodenoscopy (egd) with propofol (N/A, 01/24/2018); and Colonoscopy with propofol (N/A, 01/24/2018). Family History:  Her family history includes Anxiety disorder in her mother and sister; Arthritis/Rheumatoid in her mother; Clotting  disorder in her mother; Colon polyps in her mother and sister; Depression in her mother and sister; Heart attack in her father and mother; Heart block in her mother; Heart disease in her father; Osteoporosis in her mother. Social History:   reports that she has never smoked. She has never used smokeless tobacco. She reports that she does not drink alcohol and does not use drugs.  REVIEW OF SYSTEMS  : All other systems reviewed and negative except where noted in the History of Present Illness.   PHYSICAL EXAM: LMP  (LMP Unknown)  General:   Pleasant, well developed female in no acute distress Head:   Normocephalic and atraumatic. Eyes:  {sclerae:26738},conjunctive {conjuctiva:26739}  Heart:   {HEART EXAM HEM/ONC:21750} Pulm:  Clear anteriorly; no wheezing Abdomen:   {BlankSingle:19197::"Distended","Ridged","Soft"}, {BlankSingle:19197::"Flat","Obese","Non-distended"} AB, {BlankSingle:19197::"Absent","Hyperactive, tinkling","Hypoactive","Sluggish","Active"} bowel sounds. {actendernessAB:27319} tenderness {anatomy; site abdomen:5010}. {BlankMultiple:19196::"Without guarding","With guarding","Without rebound","With rebound"}, No organomegaly appreciated. Rectal: {acrectalexam:27461} Extremities:  {With/Without:304960234} edema. Msk: Symmetrical without gross deformities. Peripheral pulses intact.  Neurologic:  Alert and  oriented x4;  No focal deficits.  Skin:   Dry and intact without significant lesions or rashes. Psychiatric:  Cooperative. Normal mood and affect.    Vladimir Crofts, PA-C 12:31 PM

## 2021-09-10 ENCOUNTER — Ambulatory Visit: Payer: Medicare Other | Admitting: Physician Assistant

## 2021-09-11 ENCOUNTER — Other Ambulatory Visit: Payer: Self-pay | Admitting: Psychiatry

## 2021-09-11 DIAGNOSIS — F4310 Post-traumatic stress disorder, unspecified: Secondary | ICD-10-CM

## 2021-09-11 DIAGNOSIS — F410 Panic disorder [episodic paroxysmal anxiety] without agoraphobia: Secondary | ICD-10-CM

## 2021-09-13 ENCOUNTER — Other Ambulatory Visit: Payer: Self-pay | Admitting: Psychiatry

## 2021-09-13 DIAGNOSIS — F4310 Post-traumatic stress disorder, unspecified: Secondary | ICD-10-CM

## 2021-09-13 DIAGNOSIS — F410 Panic disorder [episodic paroxysmal anxiety] without agoraphobia: Secondary | ICD-10-CM

## 2021-09-13 MED ORDER — ZOLPIDEM TARTRATE ER 12.5 MG PO TBCR: EXTENDED_RELEASE_TABLET | ORAL | 0 refills | Status: DC

## 2021-09-27 ENCOUNTER — Other Ambulatory Visit: Payer: Self-pay | Admitting: Psychiatry

## 2021-09-27 DIAGNOSIS — F410 Panic disorder [episodic paroxysmal anxiety] without agoraphobia: Secondary | ICD-10-CM

## 2021-09-28 ENCOUNTER — Telehealth: Payer: Self-pay

## 2021-09-28 DIAGNOSIS — F4310 Post-traumatic stress disorder, unspecified: Secondary | ICD-10-CM

## 2021-09-28 DIAGNOSIS — F410 Panic disorder [episodic paroxysmal anxiety] without agoraphobia: Secondary | ICD-10-CM

## 2021-09-28 DIAGNOSIS — F3342 Major depressive disorder, recurrent, in full remission: Secondary | ICD-10-CM

## 2021-09-28 MED ORDER — BUPROPION HCL ER (XL) 150 MG PO TB24: 300.0000 mg | ORAL_TABLET | Freq: Every day | ORAL | 2 refills | Status: DC

## 2021-09-28 MED ORDER — LAMOTRIGINE 25 MG PO TABS: 25.0000 mg | ORAL_TABLET | Freq: Every day | ORAL | 0 refills | Status: DC

## 2021-09-28 MED ORDER — LAMOTRIGINE 200 MG PO TABS: ORAL_TABLET | ORAL | 0 refills | Status: DC

## 2021-09-28 MED ORDER — VILAZODONE HCL 40 MG PO TABS: 40.0000 mg | ORAL_TABLET | Freq: Every day | ORAL | 0 refills | Status: DC

## 2021-09-28 MED ORDER — ZOLPIDEM TARTRATE ER 12.5 MG PO TBCR: EXTENDED_RELEASE_TABLET | ORAL | 1 refills | Status: DC

## 2021-09-28 NOTE — Telephone Encounter (Signed)
pt called states she will not have enough medication to get to her next appt. she will need the bupripion, lamotrigine 212mand '25mg'$ t and the viibryd, and zolpidem.

## 2021-09-28 NOTE — Telephone Encounter (Signed)
I have sent all of her medications that needed refill including Lamictal, Viibryd, Wellbutrin, and Ambien to pharmacy.

## 2021-10-05 NOTE — Progress Notes (Signed)
10/12/2021 [name] [id number] 23-Dec-1975  Referring provider: Leonard Downing, * Primary GI doctor: Dr. Lorenso Courier  ASSESSMENT AND PLAN:   S/P gastric bypass with GERD, loss of weight, dyspepsia, dad with stomach cancer Normal CT Ab and pelvis EGD 2019 Will plan for repeat EGD, add pantoprazole.  Possible EPI- will do trial of zenpep  Diarrhea, unspecified type with fecal incontinence, back pain, urinary incontinence- has had changes since gastric bypass Normal colonoscopy 2019 hemorrhoids, recall 10 years.  Has recocele and cystocele, will refer to urogyn for evaluation, likely contributing Possible from EPI versus SIBO Will do trial of zenpep, if not helping will do SIBO testing with history of bypass -     diphenoxylate-atropine (LOMOTIL) 2.5-0.025 MG tablet; Take 1 tablet by mouth as needed.  Iron deficiency anemia, unspecified iron deficiency anemia type B12 def Normal EGD/Colon 2019 Will repeat EGD Likely from bypass  Patient Care Team: Leonard Downing, MD as PCP - General (Family Medicine)  HISTORY OF PRESENT ILLNESS: 46 y.o. female with a past medical history of cardiomyopathy, type 2 diabetes, PTSD, iron deficiency anemia and others listed below presents for evaluation of weight loss, decreased appetite, diarrhea.   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  Patient has seen Dr. May God as well as Dr. Revonda [facility] in the past.  From multiple abdominal surgeries, likely adhesions. She also had unremarkable colon 2008 at [facility] and EGD 2008 and 2015 unremarkable at [facility].  01[facility]24/[facility] Dr. Alice Reichert for functional diarrhea, fecal incontinence  Colonoscopy difficult due to redundant colon, good bowel prep normal sphincter tone, normal colonoscopy nonbleeding internal hemorrhoids grade 1 biopsies unremarkable. Endoscopy shows evidence of  Roux-en-Y gastrojejunostomy healthy-appearing anastomosis, normal examined jejunum 03/05/2021 CT abdomen pelvis with contrast for left-sided abdominal pain, nausea, emesis and diarrhea history of multiple surgeries evaluate for obstruction showed postsurgical changes of gastric bypass no evidence of bowel obstruction or inflammatory bowel changes no acute abnormality.  Focal fatty disposition of the liver status postcholecystectomy without biliary dilatation.  Normal pancreas  07/15/2021 Labs reviewed show iron 128, saturation slightly increased at 32, ferritin normal at 24, normal CBC without anemia or leukocytosis.  Negative HIV, A1c 5.6, folate normal at 7.7,  B12 very low at 80, normal thyroid at 1.4 sed rate normal. Patient did have a low ferritin 12/21/2019 iron was low normal at 30 with decreased saturations.  Currently on iron IV and B12 injections with hematology.   She states starting in Jan she started to have nausea/vomiting, could not stand the "smell of anything", had weight loss.  Could not drink coffee, felt taste buds changed.  She had the normal CT AB and pelvis.  Went on for about a month.  Symptoms have improved however she continues to have some issues.  She states if she eats something sweet she will have dumping syndrome, then if she eats greasy or sweats she has explosive diarrhea after eating anything though.  She will take lomitil and that helps significantly when she goes out to eat.  She has some epigastric burning when she eats and some epigastric pain.  She has fecal incontinence.  She has a lot of AB bloating.   Father just diagnosed with stomach cancer.    Current Medications:     Current Outpatient Medications (Respiratory):    loratadine (CLARITIN) 10 MG tablet, Take 10 mg by mouth daily. (Patient not taking: Reported  on 10/12/2021)  Current Outpatient Medications (Analgesics):    acetaminophen (TYLENOL) 325 MG tablet, Take 650 mg by mouth every 4 (four)  hours as needed for moderate pain.    AIMOVIG 140 MG/ML SOAJ, SMARTSIG:140 Milligram(s) SUB-Q Every 4 Weeks   HYDROcodone-acetaminophen (NORCO) 7.5-325 MG tablet, Take 1 tablet by mouth every 6 (six) hours as needed.   SUMAtriptan (IMITREX) 100 MG tablet, Take 100 mg by mouth every 2 (two) hours as needed for migraine or headache.    traMADol (ULTRAM) 50 MG tablet, Take by mouth. (Patient not taking: Reported on 10/06/2021)  Current Outpatient Medications (Hematological):    cyanocobalamin (,VITAMIN B-12,) 1000 MCG/ML injection, Inject 1,000 mcg into the muscle every 30 (thirty) days.   Current Outpatient Medications (Other):    buPROPion (WELLBUTRIN XL) 150 MG 24 hr tablet, Take 2 tablets (300 mg total) by mouth daily.   cyclobenzaprine (FLEXERIL) 5 MG tablet,    ergocalciferol (VITAMIN D2) 1.25 MG (50000 UT) capsule, Take 50,000 Units by mouth once a week.   hydrOXYzine (ATARAX) 25 MG tablet, TAKE 1/2 TO 1 TABLET(12.5 TO 25 MG) BY MOUTH TWICE DAILY AS NEEDED FOR ANXIETY   lamoTRIgine (LAMICTAL) 200 MG tablet, TAKE 1/2 TABLET(100 MG) BY MOUTH TWICE DAILY   lamoTRIgine (LAMICTAL) 25 MG tablet, Take 1 tablet (25 mg total) by mouth daily.   Multiple Vitamins-Minerals (HM MULTIVITAMIN ADULT GUMMY) CHEW, Chew 2 tablets by mouth daily.   thiamine (VITAMIN B-1) 100 MG tablet, Take 100 mg by mouth.   topiramate (TOPAMAX) 50 MG tablet, Take 100 mg by mouth 2 (two) times daily.   Vilazodone HCl (VIIBRYD) 40 MG TABS, Take 1 tablet (40 mg total) by mouth daily.   [START ON 10/20/2021] zolpidem (AMBIEN CR) 12.5 MG CR tablet, Take 1 tablet at bedtime   diphenoxylate-atropine (LOMOTIL) 2.5-0.025 MG tablet, Take 1 tablet by mouth as needed.   pantoprazole (PROTONIX) 40 MG tablet, Take 1 tablet (40 mg total) by mouth daily.  Medical History:  Past Medical History:  Diagnosis Date   Abnormal ECG    Anemia    Anxiety    Arthritis    Asthma    B12 deficiency    Cardiomyopathy ([place])    Chronic abdominal  pain    Complication of anesthesia    difficulty to get sedated during endoscopy   COVID-19 01/18/2019   DDD (degenerative disc disease), lumbar    Depression    Diabetes mellitus without complication (HCC)    Dysrhythmia    GERD (gastroesophageal reflux disease)    Iron deficiency    Migraine headache    Neuropathy    Obesity    Ovarian cyst    Pneumonia    within past five years   PTSD (post-traumatic stress disorder)    PTSD (post-traumatic stress disorder)    Rh negative status during pregnancy    Seizures ([place])    Small bowel obstruction (HCC)    Allergies:  Allergies  Allergen Reactions   Amoxicillin Anaphylaxis, Hives, Shortness Of Breath, Swelling and Rash    Has patient had a PCN reaction causing immediate rash, facial/tongue/throat swelling, SOB or lightheadedness with hypotension: Yes Has patient had a PCN reaction causing severe rash involving mucus membranes or skin necrosis: Yes Has patient had a PCN reaction that required hospitalization No Has patient had a PCN reaction occurring within the last 10 years: No If all of the above answers are "NO", then may proceed with Cephalosporin use. Other reaction(s): ANAPHYLAXIS Other reaction(s): ANAPHYLAX  Penicillins Anaphylaxis, Swelling, Rash, Shortness Of Breath and Hives    Has patient had a PCN reaction causing immediate rash, facial/tongue/throat swelling, SOB or lightheadedness with hypotension: Yes Has patient had a PCN reaction causing severe rash involving mucus membranes or skin necrosis: Yes Has patient had a PCN reaction that required hospitalization No Has patient had a PCN reaction occurring within the last 10 years: No If all of the above answers are "NO", then may proceed with Cephalosporin use.  Other reaction(s): ANAPHYLAXIS Other reaction(s): ANAPHYLAXIS Has patient had a PCN reaction causing immediate rash, facial/tongue/throat swelling, SOB or lightheadedness with hypotension: Yes Has patient  had a PCN reaction causing severe rash involving mucus membranes or skin necrosis: Yes Has patient had a PCN reaction that required hospitalization No Has patient had a PCN reaction occurring within the last 10 years: No If all of the above answers are "NO", then may proceed with Cephalosporin use. Other reaction(s): ANAPHYLAXIS Other reaction(s): ANAPHYLAX Has patient had a PCN reaction causing immediate rash, facial/tongue/throat swelling, SOB or lightheadedness with hypotension: Yes Has patient had a PCN reaction causing severe rash involving mucus membranes or skin necrosis: Yes Has patient had a PCN reaction that required hospitalization No Has patient had a PCN reaction occurring within the last 10 years: No If all of the above answers are "NO", then may proceed with Cephalosporin use. Other reaction(s): ANAPHYLAXIS Other reaction(s): ANAPHYLAXIS    Morphine Other (See Comments)    Muscle spasms   Sulfa Antibiotics Nausea Only    Other reaction(s): VOMITING Other reaction(s): VOMITING Other reaction(s): VOMITING     Surgical History:  She  has a past surgical history that includes Gastric bypass (2010); Cholecystectomy; Hernia repair; gi bleed (2009); bowel obstruction (2011); Vaginal hysterectomy (N/A, 09/29/2015); Bilateral salpingectomy (Bilateral, 09/29/2015); Ovarian cyst removal (Left, 09/29/2015); Abdominal hysterectomy; Dilation and curettage of uterus; Esophagogastroduodenoscopy; Roux-en-y procedure; Esophagogastroduodenoscopy (egd) with propofol (N/A, 01/24/2018); and Colonoscopy with propofol (N/A, 01/24/2018). Family History:  Her family history includes Anxiety disorder in her mother and sister; Arthritis/Rheumatoid in her mother; Clotting disorder in her mother; Colon polyps in her mother and sister; Depression in her mother and sister; Heart attack in her father and mother; Heart block in her mother; Heart disease in her father; Osteoporosis in her mother; Stomach cancer  in her father. Social History:   reports that she has never smoked. She has never used smokeless tobacco. She reports that she does not drink alcohol and does not use drugs.  REVIEW OF SYSTEMS  : All other systems reviewed and negative except where noted in the History of Present Illness.   PHYSICAL EXAM: BP 106/78   Pulse 80   Ht '5\' 7"'$  (1.702 m)   Wt 141 lb 8 oz (64.2 kg)   LMP  (LMP Unknown)   BMI 22.16 kg/m  General:   Pleasant, well developed female in no acute distress Head:   Normocephalic and atraumatic. Eyes:  sclerae anicteric,conjunctive pink  Heart:   regular rate and rhythm Pulm:  Clear anteriorly; no wheezing Abdomen:   Soft, Non-distended AB, Active bowel sounds. mild tenderness in the epigastrium. Without guarding and Without rebound, No organomegaly appreciated. Rectal: Not evaluated Extremities:  Without edema. Msk: Symmetrical without gross deformities. Peripheral pulses intact.  Neurologic:  Alert and  oriented x4;  No focal deficits.  Skin:   Dry and intact without significant lesions or rashes. Psychiatric:  Cooperative. Normal mood and affect.    Vladimir Crofts, PA-C 9:28 AM

## 2021-10-06 ENCOUNTER — Telehealth (INDEPENDENT_AMBULATORY_CARE_PROVIDER_SITE_OTHER): Payer: Medicare Other | Admitting: Psychiatry

## 2021-10-06 ENCOUNTER — Encounter: Payer: Self-pay | Admitting: Psychiatry

## 2021-10-06 DIAGNOSIS — F5101 Primary insomnia: Secondary | ICD-10-CM

## 2021-10-06 DIAGNOSIS — F4310 Post-traumatic stress disorder, unspecified: Secondary | ICD-10-CM | POA: Diagnosis not present

## 2021-10-06 DIAGNOSIS — F410 Panic disorder [episodic paroxysmal anxiety] without agoraphobia: Secondary | ICD-10-CM | POA: Diagnosis not present

## 2021-10-06 DIAGNOSIS — F3342 Major depressive disorder, recurrent, in full remission: Secondary | ICD-10-CM | POA: Diagnosis not present

## 2021-10-06 DIAGNOSIS — F4320 Adjustment disorder, unspecified: Secondary | ICD-10-CM

## 2021-10-06 NOTE — Progress Notes (Unsigned)
Virtual Visit via Video Note  I connected with [name] on 10/06/21 at  8:30 AM EDT by a video enabled telemedicine application and verified that I am speaking with the correct person using two identifiers.  Location Provider Location : [facility] Patient Location : Car  Participants: Patient , Provider    I discussed the limitations of evaluation and management by telemedicine and the availability of in person appointments. The patient expressed understanding and agreed to proceed.   I discussed the assessment and treatment plan with the patient. The patient was provided an opportunity to ask questions and all were answered. The patient agreed with the plan and demonstrated an understanding of the instructions.   The patient was advised to call back or seek an in-person evaluation if the symptoms worsen or if the condition fails to improve as anticipated.   [name] OP Progress Note  10/07/2021 8:17 AM DYNESHIA BACCAM  MRN:  [id number]  Chief Complaint:  Chief Complaint  Patient presents with   Follow-up: 46 year old Caucasian female with history of depression, anxiety, insomnia, chronic pain, presented for medication management.   HPI: [name] is a 46 year old Caucasian female, divorced, lives in [place], has a history of MDD, panic disorder, PTSD, primary insomnia was evaluated by telemedicine today.  Patient today reports she is currently struggling with multiple psychosocial stressors.  She reports she is in pain of her neck.  Recently got nerve block for her lower back and that helped.  She is planning to get another nerve block for her neck.  She reports the pain does affect her mood as well as affects her sleep at night.  Otherwise the zolpidem has been beneficial.  Patient reports she is currently on hydrocodone, however does not want to stay on it since it is making her constipated.  She does have upcoming appointment with [facility].  Reports she also  has psychosocial stressors of her dad who was recently diagnosed with stomach cancer.  That does worry her a lot.  Patient reports sadness, tearfulness when she thinks about her dad.    Patient denies any appetite changes.  Patient denies any suicidality, homicidality or perceptual disturbances.  Patient is compliant on her medications.  Denies side effects.  Continues to be noncompliant with psychotherapy referral, however agreeable to reach out to Ms. Miguel Dibble, previous therapist.  Denies any other concerns.  Visit Diagnosis:    ICD-10-CM   1. MDD (major depressive disorder), recurrent, in full remission ([place])  F33.42     2. Panic disorder  F41.0     3. PTSD (post-traumatic stress disorder)  F43.10     4. Primary insomnia  F51.01     5. Adjustment disorder, unspecified type  F43.20       Past Psychiatric History: Reviewed past psychiatric history from progress note on 05/09/2017.  Past Medical History:  Past Medical History:  Diagnosis Date   Abnormal ECG    Anemia    Anxiety    Arthritis    Asthma    B12 deficiency    Cardiomyopathy ([place])    Chronic abdominal pain    Complication of anesthesia    difficulty to get sedated during endoscopy   COVID-19 01/18/2019   DDD (degenerative disc disease), lumbar    Depression    Diabetes mellitus without complication (HCC)    Dysrhythmia    GERD (gastroesophageal reflux disease)    Iron deficiency    Migraine headache    Neuropathy  Obesity    Ovarian cyst    Pneumonia    within past five years   PTSD (post-traumatic stress disorder)    PTSD (post-traumatic stress disorder)    Rh negative status during pregnancy    Seizures ([place])    Small bowel obstruction (HCC)     Past Surgical History:  Procedure Laterality Date   ABDOMINAL HYSTERECTOMY     BILATERAL SALPINGECTOMY Bilateral 09/29/2015   Procedure: BILATERAL SALPINGECTOMY;  Surgeon: Benjaman Kindler, MD;  Location: [facility];  Service: Gynecology;   Laterality: Bilateral;   bowel obstruction  2011   CHOLECYSTECTOMY     COLONOSCOPY WITH PROPOFOL N/A 01/24/2018   Procedure: COLONOSCOPY WITH PROPOFOL;  Surgeon: Toledo, Benay Pike, MD;  Location: [facility];  Service: Gastroenterology;  Laterality: N/A;   DILATION AND CURETTAGE OF UTERUS     ESOPHAGOGASTRODUODENOSCOPY     ESOPHAGOGASTRODUODENOSCOPY (EGD) WITH PROPOFOL N/A 01/24/2018   Procedure: ESOPHAGOGASTRODUODENOSCOPY (EGD) WITH PROPOFOL;  Surgeon: Toledo, Benay Pike, MD;  Location: [facility];  Service: Gastroenterology;  Laterality: N/A;   GASTRIC BYPASS  2010   gi bleed  2009   Surgery-was in ICU for three weeks   HERNIA REPAIR     OVARIAN CYST REMOVAL Left 09/29/2015   Procedure: OVARIAN CYSTECTOMY;  Surgeon: Benjaman Kindler, MD;  Location: [facility];  Service: Gynecology;  Laterality: Left;   ROUX-EN-Y PROCEDURE     VAGINAL HYSTERECTOMY N/A 09/29/2015   Procedure: HYSTERECTOMY VAGINAL;  Surgeon: Benjaman Kindler, MD;  Location: [facility];  Service: Gynecology;  Laterality: N/A;    Family Psychiatric History: Reviewed family psychiatric history from progress note on 05/09/2017.  Family History:  Family History  Problem Relation Age of Onset   Arthritis/Rheumatoid Mother    Heart block Mother    Clotting disorder Mother    Osteoporosis Mother    Heart attack Mother    Anxiety disorder Mother    Depression Mother    Colon polyps Mother    Heart disease Father    Heart attack Father    Depression Sister    Anxiety disorder Sister    Colon polyps Sister     Social History: Reviewed social history from progress note on 05/09/2017. Social History   Socioeconomic History   Marital status: Single    Spouse name: Not on file   Number of children: Not on file   Years of education: Not on file   Highest education level: Not on file  Occupational History   Not on file  Tobacco Use   Smoking status: Never   Smokeless tobacco: Never  Vaping Use   Vaping Use: Never  used  Substance and Sexual Activity   Alcohol use: No    Alcohol/week: 0.0 standard drinks of alcohol   Drug use: No   Sexual activity: Yes    Partners: Male    Birth control/protection: Surgical  Other Topics Concern   Not on file  Social History Narrative   Not on file   Social Determinants of Health   Financial Resource Strain: Not on file  Food Insecurity: Not on file  Transportation Needs: Not on file  Physical Activity: Not on file  Stress: Not on file  Social Connections: Not on file    Allergies:  Allergies  Allergen Reactions   Amoxicillin Anaphylaxis, Hives, Shortness Of Breath, Swelling and Rash    Has patient had a PCN reaction causing immediate rash, facial/tongue/throat swelling, SOB or lightheadedness with hypotension: Yes Has patient had a PCN reaction causing  severe rash involving mucus membranes or skin necrosis: Yes Has patient had a PCN reaction that required hospitalization No Has patient had a PCN reaction occurring within the last 10 years: No If all of the above answers are "NO", then may proceed with Cephalosporin use. Other reaction(s): ANAPHYLAXIS Other reaction(s): ANAPHYLAX   Penicillins Anaphylaxis, Swelling, Rash, Shortness Of Breath and Hives    Has patient had a PCN reaction causing immediate rash, facial/tongue/throat swelling, SOB or lightheadedness with hypotension: Yes Has patient had a PCN reaction causing severe rash involving mucus membranes or skin necrosis: Yes Has patient had a PCN reaction that required hospitalization No Has patient had a PCN reaction occurring within the last 10 years: No If all of the above answers are "NO", then may proceed with Cephalosporin use.  Other reaction(s): ANAPHYLAXIS Other reaction(s): ANAPHYLAXIS Has patient had a PCN reaction causing immediate rash, facial/tongue/throat swelling, SOB or lightheadedness with hypotension: Yes Has patient had a PCN reaction causing severe rash involving mucus  membranes or skin necrosis: Yes Has patient had a PCN reaction that required hospitalization No Has patient had a PCN reaction occurring within the last 10 years: No If all of the above answers are "NO", then may proceed with Cephalosporin use. Other reaction(s): ANAPHYLAXIS Other reaction(s): ANAPHYLAX Has patient had a PCN reaction causing immediate rash, facial/tongue/throat swelling, SOB or lightheadedness with hypotension: Yes Has patient had a PCN reaction causing severe rash involving mucus membranes or skin necrosis: Yes Has patient had a PCN reaction that required hospitalization No Has patient had a PCN reaction occurring within the last 10 years: No If all of the above answers are "NO", then may proceed with Cephalosporin use. Other reaction(s): ANAPHYLAXIS Other reaction(s): ANAPHYLAXIS    Morphine Other (See Comments)    Muscle spasms   Sulfa Antibiotics Nausea Only    Other reaction(s): VOMITING Other reaction(s): VOMITING Other reaction(s): VOMITING    Metabolic Disorder Labs: Lab Results  Component Value Date   HGBA1C 5.5 12/11/2019   MPG 111 12/11/2019   Lab Results  Component Value Date   PROLACTIN 12.1 12/11/2019   Lab Results  Component Value Date   CHOL 162 12/11/2019   TRIG 51 12/11/2019   HDL 87 12/11/2019   CHOLHDL 1.9 12/11/2019   VLDL 10 12/11/2019   LDLCALC 65 12/11/2019   Lab Results  Component Value Date   TSH 1.886 12/11/2019   TSH  05/13/2009    3.163 (NOTE)  Please note change in reference ranges for ages 19W to 21Y. Test methodology is 3rd generation TSH    Therapeutic Level Labs: No results found for: "LITHIUM" No results found for: "VALPROATE" No results found for: "CBMZ"  Current Medications: Current Outpatient Medications  Medication Sig Dispense Refill   acetaminophen (TYLENOL) 325 MG tablet Take 650 mg by mouth every 4 (four) hours as needed for moderate pain.      AIMOVIG 140 MG/ML SOAJ SMARTSIG:140 Milligram(s) SUB-Q  Every 4 Weeks     buPROPion (WELLBUTRIN XL) 150 MG 24 hr tablet Take 2 tablets (300 mg total) by mouth daily. 60 tablet 2   cyanocobalamin (,VITAMIN B-12,) 1000 MCG/ML injection Inject 1,000 mcg into the muscle every 30 (thirty) days.      diphenoxylate-atropine (LOMOTIL) 2.5-0.025 MG tablet      ergocalciferol (VITAMIN D2) 1.25 MG (50000 UT) capsule Take by mouth.     HYDROcodone-acetaminophen (NORCO) 7.5-325 MG tablet Take 1 tablet by mouth every 6 (six) hours as needed.  hydrOXYzine (ATARAX) 25 MG tablet TAKE 1/2 TO 1 TABLET(12.5 TO 25 MG) BY MOUTH TWICE DAILY AS NEEDED FOR ANXIETY 60 tablet 2   lamoTRIgine (LAMICTAL) 200 MG tablet TAKE 1/2 TABLET(100 MG) BY MOUTH TWICE DAILY 90 tablet 0   lamoTRIgine (LAMICTAL) 25 MG tablet Take 1 tablet (25 mg total) by mouth daily. 90 tablet 0   loratadine (CLARITIN) 10 MG tablet Take 10 mg by mouth daily.     methylPREDNISolone (MEDROL DOSEPAK) 4 MG TBPK tablet Take by mouth as directed.     Multiple Vitamins-Minerals (HM MULTIVITAMIN ADULT GUMMY) CHEW Chew 2 tablets by mouth daily.     SUMAtriptan (IMITREX) 100 MG tablet Take 100 mg by mouth every 2 (two) hours as needed for migraine or headache.      thiamine (VITAMIN B-1) 100 MG tablet Take 100 mg by mouth.     topiramate (TOPAMAX) 50 MG tablet Take by mouth.     Vilazodone HCl (VIIBRYD) 40 MG TABS Take 1 tablet (40 mg total) by mouth daily. 90 tablet 0   [START ON 10/20/2021] zolpidem (AMBIEN CR) 12.5 MG CR tablet Take 1 tablet at bedtime 30 tablet 1   cyclobenzaprine (FLEXERIL) 5 MG tablet Take 1 tablet twice a day by oral route for 30 days. (Patient not taking: Reported on 10/06/2021)     traMADol (ULTRAM) 50 MG tablet Take by mouth. (Patient not taking: Reported on 10/06/2021)     No current facility-administered medications for this visit.     Musculoskeletal: Strength & Muscle Tone:  UTA Gait & Station:  Seated Patient leans: N/A  Psychiatric Specialty Exam: Review of Systems   Musculoskeletal:  Positive for back pain and neck pain.  Psychiatric/Behavioral:  Positive for dysphoric mood and sleep disturbance.   All other systems reviewed and are negative.   There were no vitals taken for this visit.There is no height or weight on file to calculate BMI.  General Appearance: Casual  Eye Contact:  Fair  Speech:  Clear and Coherent  Volume:  Normal  Mood:  Dysphoric  Affect:  Tearful  Thought Process:  Goal Directed and Descriptions of Associations: Intact  Orientation:  Full (Time, Place, and Person)  Thought Content: Logical   Suicidal Thoughts:  No  Homicidal Thoughts:  No  Memory:  Immediate;   Fair Recent;   Fair Remote;   Fair  Judgement:  Fair  Insight:  Fair  Psychomotor Activity:  Normal  Concentration:  Concentration: Fair and Attention Span: Fair  Recall:  [employer] of Knowledge: Fair  Language: Fair  Akathisia:  No  Handed:  Right  AIMS (if indicated): not done  Assets:  Communication Skills Desire for Improvement Housing Social Support  ADL's:  Intact  Cognition: WNL  Sleep:  Poor due to pain   Screenings: AIMS    Flowsheet Row Video Visit from 06/16/2021 in [place] Total Score 0      GAD-7    Flowsheet Row Video Visit from 06/16/2021 in [place]  Total GAD-7 Score 7      PHQ2-9    [place] Video Visit from 10/06/2021 in [place] Video Visit from 06/16/2021 in [place] Video Visit from 04/16/2021 in [place] Office Visit from 01/16/2021 in [place] Video Visit from 08/04/2020 in [place]  PHQ-2 Total Score '1 1 3 '$ 0 0  PHQ-9 Total Score '9 6 8 '$ -- --  Flowsheet Row Video Visit from 10/06/2021 in [place] Video Visit from 04/16/2021 in [place] ED from 03/13/2021 in [place] CATEGORY No Risk No Risk No Risk        Assessment and Plan: [name] is a 46 year old Caucasian female who has a history of depression, anxiety, insomnia was evaluated by telemedicine today.  Patient is currently struggling with multiple psychosocial stressors, chronic pain although having depression as well as sleep problems due to current stressors and pain reports she has been coping well, motivated to start psychotherapy sessions.  Plan MDD-in partial remission Lamotrigine 225 mg p.o. daily.  We will consider increasing the lamotrigine dosage in the future.  However patient today wants to pursue psychotherapy sessions prior to making more medication changes. Wellbutrin XL 300 mg p.o. daily-reduced dosage. Viibryd 40 mg p.o. daily  PTSD-stable Viibryd 40 mg p.o. daily Hydroxyzine 25 mg p.o. nightly  Panic disorder-stable Hydroxyzine 25 mg p.o. nightly as needed  Insomnia-unstable due to pain She will need sufficient pain management. Ambien CR 12.5 mg p.o. nightly.  Patient advised not to combine Ambien with opioids and other CNS depressants like Flexeril. Hydroxyzine 25 mg p.o. nightly Reviewed [place] PMP aware  Adjustment disorder-unspecified-unstable Patient advised to reestablish care with therapist.  Agrees to contact Ms. Miguel Dibble.  Also provided community resources.  Follow-up in clinic in 2 months or sooner if needed.    Collaboration of Care: Collaboration of Care: Referral or follow-up with counselor/therapist AEB encouraged to follow up with therapist and Other encouraged to follow-up with pain provider  Patient/Guardian was advised Release of Information must be obtained prior to any record release in order to collaborate their care with an outside provider. Patient/Guardian was advised if they have not already done so to contact the registration department  to sign all necessary forms in order for [place] to release information regarding their care.   Consent: Patient/Guardian gives verbal consent for treatment and assignment of benefits for services provided during this visit. Patient/Guardian expressed understanding and agreed to proceed.   This note was generated in part or whole with voice recognition software. Voice recognition is usually quite accurate but there are transcription errors that can and very often do occur. I apologize for any typographical errors that were not detected and corrected.      Ursula Alert, MD 10/07/2021, 8:17 AM

## 2021-10-06 NOTE — Patient Instructions (Signed)
[email or web address]  [email or web address]  [email or web address]   [facility] [place] [phone]  [facility] - [phone]

## 2021-10-07 DIAGNOSIS — F4320 Adjustment disorder, unspecified: Secondary | ICD-10-CM | POA: Insufficient documentation

## 2021-10-12 ENCOUNTER — Encounter: Payer: Self-pay | Admitting: Physician Assistant

## 2021-10-12 ENCOUNTER — Ambulatory Visit (INDEPENDENT_AMBULATORY_CARE_PROVIDER_SITE_OTHER): Payer: Medicare Other | Admitting: Physician Assistant

## 2021-10-12 DIAGNOSIS — R1013 Epigastric pain: Secondary | ICD-10-CM

## 2021-10-12 DIAGNOSIS — R197 Diarrhea, unspecified: Secondary | ICD-10-CM

## 2021-10-12 DIAGNOSIS — R634 Abnormal weight loss: Secondary | ICD-10-CM | POA: Diagnosis not present

## 2021-10-12 DIAGNOSIS — K219 Gastro-esophageal reflux disease without esophagitis: Secondary | ICD-10-CM | POA: Diagnosis not present

## 2021-10-12 DIAGNOSIS — G8929 Other chronic pain: Secondary | ICD-10-CM

## 2021-10-12 DIAGNOSIS — Z9884 Bariatric surgery status: Secondary | ICD-10-CM | POA: Diagnosis not present

## 2021-10-12 DIAGNOSIS — D509 Iron deficiency anemia, unspecified: Secondary | ICD-10-CM

## 2021-10-12 MED ORDER — PANTOPRAZOLE SODIUM 40 MG PO TBEC: 40.0000 mg | DELAYED_RELEASE_TABLET | Freq: Every day | ORAL | 3 refills | Status: DC

## 2021-10-12 MED ORDER — DIPHENOXYLATE-ATROPINE 2.5-0.025 MG PO TABS: 1.0000 | ORAL_TABLET | ORAL | 0 refills | Status: DC | PRN

## 2021-10-12 NOTE — Patient Instructions (Addendum)
If you are age 44 or younger, your body mass index should be between 19-25. Your Body mass index is 22.16 kg/m. If this is out of the aformentioned range listed, please consider follow up with your Primary Care Provider.  ________________________________________________________  The [facility] providers would like to encourage you to use [facility] to communicate with providers for non-urgent requests or questions.  Due to long hold times on the telephone, sending your provider a message by [facility] may be a faster and more efficient way to get a response.  Please allow 48 business hours for a response.  Please remember that this is for non-urgent requests.  _______________________________________________________  Dennis Bast have been scheduled for an endoscopy. Please follow written instructions given to you at your visit today. If you use inhalers (even only as needed), please bring them with you on the day of your procedure.  Due to recent changes in healthcare laws, you may see the results of your imaging and laboratory studies on MyChart before your provider has had a chance to review them.  We understand that in some cases there may be results that are confusing or concerning to you. Not all laboratory results come back in the same time frame and the provider may be waiting for multiple results in order to interpret others.  Please give [place] 48 hours in order for your provider to thoroughly review all the results before contacting the office for clarification of your results.   Follow up with GYN/Surgery for possible repair Can do pelvic floor PT  We may want to evaluate you for small intestinal bacterial overgrowth, this can cause increase gas, bloating, loose stools or constipation.  There is a test for this we can do or sometimes we will treat a patient with an antibiotic to see if it helps.   Please take your proton pump inhibitor medication, pantoprazole  Please take this medication 30 minutes to 1  hour before meals- this makes it more effective.  Avoid spicy and acidic foods Avoid fatty foods Limit your intake of coffee, tea, alcohol, and carbonated drinks Work to maintain a healthy weight Keep the head of the bed elevated at least 3 inches with blocks or a wedge pillow if you are having any nighttime symptoms Stay upright for 2 hours after eating Avoid meals and snacks three to four hours before bedtime  Do smaller more frequent meals Try to do low fat Do more lean protein Avoid too much fiber like lentils and beans- do smaller portions.  Avoid alcohol and do not smoke.  Remember to take the pills 2 with every meal and 1 with snacks  Pancreatitic Eating Plan What are tips for following this plan? Reading food labels Use the information on food labels to help keep track of how much fat you eat: Check the serving size. Look for the amount of total fat in grams (g) in one serving. Low-fat foods have 3 g of fat or less per serving. Fat-free foods have 0.5 g of fat or less per serving. Keep track of how much fat you eat based on how many servings you eat. For example, if you eat two servings, the amount of fat you eat will be two times what is listed on the label. Shopping  Buy low-fat or nonfat foods, such as: Fresh, frozen, or canned fruits and vegetables. Grains, including pasta, bread, and rice. Lean meat, poultry, fish, and other protein foods. Low-fat or nonfat dairy. Avoid buying bakery products and other sweets  made with whole milk, butter, and eggs. Avoid buying snack foods with added fat, such as anything with butter or cheese flavoring. Cooking Remove skin from poultry, and remove extra fat from meat. Limit the amount of fat and oil you use to 6 teaspoons or less per day. Cook using low-fat methods, such as boiling, broiling, grilling, steaming, or baking. Use spray oil to cook. Add fat-free chicken broth to add flavor and moisture. Avoid adding cream to thicken  soups or sauces. Use other thickeners such as corn starch or tomato paste. Meal planning  Eat a low-fat diet as told by your dietitian. For most people, this means having no more than 55-65 grams of fat each day. Eat small, frequent meals throughout the day. For example, you may have 5-6 small meals instead of 3 large meals. Drink enough fluid to keep your urine pale yellow. Do not drink alcohol. Talk to your health care provider if you need help stopping. Limit how much caffeine you have, including black coffee, black and green tea, caffeinated soft drinks, and energy drinks. General information Let your health care provider or dietitian know if you have unplanned weight loss on this eating plan. You may be instructed to follow a clear liquid diet during a flare of symptoms. Talk with your health care provider about how to manage your diet during symptoms of a flare. Take any vitamins or supplements as told by your health care provider. Work with a Microbiologist, especially if you have other conditions such as obesity or diabetes mellitus. What foods should I avoid? Fruits Fried fruits. Fruits served with butter or cream. Vegetables Fried vegetables. Vegetables cooked with butter, cheese, or cream. Grains Biscuits, waffles, donuts, pastries, and croissants. Pies and cookies. Butter-flavored popcorn. Regular crackers. Meats and other protein foods Fatty cuts of meat. Poultry with skin. Organ meats. Bacon, sausage, and cold cuts. Whole eggs. Nuts and nut butters. Dairy Whole and 2% milk. Whole milk yogurt. Whole milk ice cream. Cream and half-and-half. Cream cheese. Sour cream. Cheese. Beverages Wine, beer, and liquor. The items listed above may not be a complete list of foods and beverages to avoid. Contact a dietitian for more information.  This information is not intended to replace advice given to you by your health care provider. Make sure you discuss any questions you have with your  health care provider. Document Revised: 05/25/2018 Document Reviewed: 05/10/2017 Elsevier Patient Education  2022 [place] you for entrusting me with your care and choosing [facility].  Vicie Mutters, PA-C

## 2021-10-13 ENCOUNTER — Ambulatory Visit (AMBULATORY_SURGERY_CENTER): Payer: Medicare Other | Admitting: Internal Medicine

## 2021-10-13 ENCOUNTER — Encounter: Payer: Self-pay | Admitting: Internal Medicine

## 2021-10-13 VITALS — BP 107/67 | HR 76 | Temp 98.7°F | Resp 16 | Ht 67.0 in | Wt 141.0 lb

## 2021-10-13 DIAGNOSIS — R634 Abnormal weight loss: Secondary | ICD-10-CM | POA: Diagnosis not present

## 2021-10-13 DIAGNOSIS — K219 Gastro-esophageal reflux disease without esophagitis: Secondary | ICD-10-CM | POA: Diagnosis not present

## 2021-10-13 DIAGNOSIS — R1013 Epigastric pain: Secondary | ICD-10-CM | POA: Diagnosis not present

## 2021-10-13 DIAGNOSIS — K317 Polyp of stomach and duodenum: Secondary | ICD-10-CM

## 2021-10-13 DIAGNOSIS — K3189 Other diseases of stomach and duodenum: Secondary | ICD-10-CM | POA: Diagnosis not present

## 2021-10-13 DIAGNOSIS — K319 Disease of stomach and duodenum, unspecified: Secondary | ICD-10-CM | POA: Diagnosis not present

## 2021-10-13 MED ORDER — SODIUM CHLORIDE 0.9 % IV SOLN: 500.0000 mL | Freq: Once | INTRAVENOUS | Status: DC

## 2021-10-13 MED ORDER — PANTOPRAZOLE SODIUM 40 MG PO TBEC: 40.0000 mg | DELAYED_RELEASE_TABLET | Freq: Two times a day (BID) | ORAL | 3 refills | Status: DC

## 2021-10-13 NOTE — Progress Notes (Signed)
Called to room to assist during endoscopic procedure.  Patient ID and intended procedure confirmed with present staff. Received instructions for my participation in the procedure from the performing physician.  

## 2021-10-13 NOTE — Patient Instructions (Signed)
Use Protonix (pantoprazole) 40 mg by mouth twice a day for 8 weeks as a trial.   Return to GI clinic in 6 weeks.   YOU HAD AN ENDOSCOPIC PROCEDURE TODAY AT [facility]:   Refer to the procedure report that was given to you for any specific questions about what was found during the examination.  If the procedure report does not answer your questions, please call your gastroenterologist to clarify.  If you requested that your care partner not be given the details of your procedure findings, then the procedure report has been included in a sealed envelope for you to review at your convenience later.  YOU SHOULD EXPECT: Some feelings of bloating in the abdomen. Passage of more gas than usual.  Walking can help get rid of the air that was put into your GI tract during the procedure and reduce the bloating. If you had a lower endoscopy (such as a colonoscopy or flexible sigmoidoscopy) you may notice spotting of blood in your stool or on the toilet paper. If you underwent a bowel prep for your procedure, you may not have a normal bowel movement for a few days.  Please Note:  You might notice some irritation and congestion in your nose or some drainage.  This is from the oxygen used during your procedure.  There is no need for concern and it should clear up in a day or so.  SYMPTOMS TO REPORT IMMEDIATELY  Following upper endoscopy (EGD)  Vomiting of blood or coffee ground material  New chest pain or pain under the shoulder blades  Painful or persistently difficult swallowing  New shortness of breath  Fever of 100F or higher  Black, tarry-looking stools  For urgent or emergent issues, a gastroenterologist can be reached at any hour by calling [phone]. Do not use MyChart messaging for urgent concerns.    DIET:  We do recommend a small meal at first, but then you may proceed to your regular diet.  Drink plenty of fluids but you should avoid alcoholic beverages for 24  hours.  ACTIVITY:  You should plan to take it easy for the rest of today and you should NOT DRIVE or use heavy machinery until tomorrow (because of the sedation medicines used during the test).    FOLLOW UP: Our staff will call the number listed on your records the next business day following your procedure.  We will call around 7:15- 8:00 am to check on you and address any questions or concerns that you may have regarding the information given to you following your procedure. If we do not reach you, we will leave a message.  If you develop any symptoms (ie: fever, flu-like symptoms, shortness of breath, cough etc.) before then, please call [phone].  If you test positive for Covid 19 in the 2 weeks post procedure, please call and report this information to [place].    If any biopsies were taken you will be contacted by phone or by letter within the next 1-3 weeks.  Please call us at [phone] if you have not heard about the biopsies in 3 weeks.    SIGNATURES/CONFIDENTIALITY: You and/or your care partner have signed paperwork which will be entered into your electronic medical record.  These signatures attest to the fact that that the information above on your After Visit Summary has been reviewed and is understood.  Full responsibility of the confidentiality of this discharge information lies with you and/or your care-partner.

## 2021-10-13 NOTE — Progress Notes (Signed)
GASTROENTEROLOGY PROCEDURE H&P NOTE   Primary Care Physician: Leonard Downing, MD    Reason for Procedure:   Dyspepsia, weight loss, IDA, history of gastric bypass, family history of stomach cancer  Plan:    EGD  Patient is appropriate for endoscopic procedure(s) in the ambulatory ([place]) setting.  The nature of the procedure, as well as the risks, benefits, and alternatives were carefully and thoroughly reviewed with the patient. Ample time for discussion and questions allowed. The patient understood, was satisfied, and agreed to proceed.     HPI: TIFFINE HENIGAN is a 46 y.o. female who presents for EGD for evaluation of dyspepsia, weight loss, IDA, history of gastric bypass, family history of stomach cancer .  Patient was most recently seen in the Gastroenterology Clinic on 10/12/21.  No interval change in medical history since that appointment. Please refer to that note for full details regarding GI history and clinical presentation.   Past Medical History:  Diagnosis Date   Abnormal ECG    Anemia    Anxiety    Arthritis    Asthma    B12 deficiency    Cardiomyopathy ([place])    Chronic abdominal pain    Complication of anesthesia    difficulty to get sedated during endoscopy   COVID-19 01/18/2019   DDD (degenerative disc disease), lumbar    Depression    Diabetes mellitus without complication (HCC)    Dysrhythmia    GERD (gastroesophageal reflux disease)    Iron deficiency    Migraine headache    Neuropathy    Obesity    Ovarian cyst    Pneumonia    within past five years   PTSD (post-traumatic stress disorder)    PTSD (post-traumatic stress disorder)    Rh negative status during pregnancy    Seizures ([place])    Small bowel obstruction ([place])     Past Surgical History:  Procedure Laterality Date   ABDOMINAL HYSTERECTOMY     BILATERAL SALPINGECTOMY Bilateral 09/29/2015   Procedure: BILATERAL SALPINGECTOMY;  Surgeon: Benjaman Kindler, MD;  Location: [facility]  ORS;  Service: Gynecology;  Laterality: Bilateral;   bowel obstruction  2011   CHOLECYSTECTOMY     COLONOSCOPY WITH PROPOFOL N/A 01/24/2018   Procedure: COLONOSCOPY WITH PROPOFOL;  Surgeon: Toledo, Benay Pike, MD;  Location: [facility] END[facility]COPY;  Service: Gastroenterology;  Laterality: N/A;   DILATION AND CURETTAGE OF UTERUS     ESOPHAGOGASTRODUODENOSCOPY     ESOPHAGOGASTRODUODENOSCOPY (EGD) WITH PROPOFOL N/A 01/24/2018   Procedure: ESOPHAGOGASTRODUODENOSCOPY (EGD) WITH PROPOFOL;  Surgeon: Toledo, Benay Pike, MD;  Location: [facility] END[facility]COPY;  Service: Gastroenterology;  Laterality: N/A;   GASTRIC BYPASS  2010   gi bleed  2009   Surgery-was in ICU for three weeks   HERNIA REPAIR     OVARIAN CYST REMOVAL Left 09/29/2015   Procedure: OVARIAN CYSTECTOMY;  Surgeon: Benjaman Kindler, MD;  Location: [facility] ORS;  Service: Gynecology;  Laterality: Left;   ROUX-EN-Y PROCEDURE     VAGINAL HYSTERECTOMY N/A 09/29/2015   Procedure: HYSTERECTOMY VAGINAL;  Surgeon: Benjaman Kindler, MD;  Location: [facility] ORS;  Service: Gynecology;  Laterality: N/A;    Prior to Admission medications   Medication Sig Start Date End Date Taking? Authorizing Provider  acetaminophen (TYLENOL) 325 MG tablet Take 650 mg by mouth every 4 (four) hours as needed for moderate pain.  04/03/14  Yes [provider]  buPROPion (WELLBUTRIN XL) 150 MG 24 hr tablet Take 2 tablets (300 mg total) by mouth daily. 09/28/21  Yes Ursula Alert, MD  cyclobenzaprine (FLEXERIL) 5 MG tablet    Yes [provider]  diphenoxylate-atropine (LOMOTIL) 2.5-0.025 MG tablet Take 1 tablet by mouth as needed. 10/12/21  Yes Vicie Mutters R, PA-C  ergocalciferol (VITAMIN D2) 1.25 MG (50000 UT) capsule Take 50,000 Units by mouth once a week. 05/23/14  Yes [provider]  HYDROcodone-acetaminophen (NORCO) 7.5-325 MG tablet Take 1 tablet by mouth every 6 (six) hours as needed. 10/01/21  Yes [provider]  hydrOXYzine (ATARAX) 25 MG tablet  TAKE 1/2 TO 1 TABLET(12.5 TO 25 MG) BY MOUTH TWICE DAILY AS NEEDED FOR ANXIETY 09/28/21  Yes Eappen, Ria Clock, MD  lamoTRIgine (LAMICTAL) 200 MG tablet TAKE 1/2 TABLET(100 MG) BY MOUTH TWICE DAILY 09/28/21  Yes Ursula Alert, MD  lamoTRIgine (LAMICTAL) 25 MG tablet Take 1 tablet (25 mg total) by mouth daily. 09/28/21  Yes Ursula Alert, MD  Multiple Vitamins-Minerals (HM MULTIVITAMIN ADULT GUMMY) CHEW Chew 2 tablets by mouth daily.   Yes [provider]  thiamine (VITAMIN B-1) 100 MG tablet Take 100 mg by mouth. 07/22/08  Yes [provider]  topiramate (TOPAMAX) 50 MG tablet Take 100 mg by mouth 2 (two) times daily. 10/16/20  Yes [provider]  Vilazodone HCl (VIIBRYD) 40 MG TABS Take 1 tablet (40 mg total) by mouth daily. 09/28/21  Yes Ursula Alert, MD  zolpidem (AMBIEN CR) 12.5 MG CR tablet Take 1 tablet at bedtime 10/20/21  Yes Eappen, Ria Clock, MD  AIMOVIG 140 MG/ML SOAJ SMARTSIG:140 Milligram(s) SUB-Q Every 4 Weeks 09/07/21   [provider]  cyanocobalamin (,VITAMIN B-12,) 1000 MCG/ML injection Inject 1,000 mcg into the muscle every 30 (thirty) days.  01/13/15   [provider]  loratadine (CLARITIN) 10 MG tablet Take 10 mg by mouth daily. Patient not taking: Reported on 10/12/2021 03/29/19   [provider]  pantoprazole (PROTONIX) 40 MG tablet Take 1 tablet (40 mg total) by mouth daily. Patient not taking: Reported on 10/13/2021 10/12/21   Vladimir Crofts, PA-C  SUMAtriptan (IMITREX) 100 MG tablet Take 100 mg by mouth every 2 (two) hours as needed for migraine or headache.     [provider]  traMADol (ULTRAM) 50 MG tablet Take by mouth. Patient not taking: Reported on 10/06/2021 12/10/19   [provider]    Current Outpatient Medications  Medication Sig Dispense Refill   acetaminophen (TYLENOL) 325 MG tablet Take 650 mg by mouth every 4 (four) hours as needed for moderate pain.      buPROPion (WELLBUTRIN XL) 150 MG 24  hr tablet Take 2 tablets (300 mg total) by mouth daily. 60 tablet 2   cyclobenzaprine (FLEXERIL) 5 MG tablet      diphenoxylate-atropine (LOMOTIL) 2.5-0.025 MG tablet Take 1 tablet by mouth as needed. 60 tablet 0   ergocalciferol (VITAMIN D2) 1.25 MG (50000 UT) capsule Take 50,000 Units by mouth once a week.     HYDROcodone-acetaminophen (NORCO) 7.5-325 MG tablet Take 1 tablet by mouth every 6 (six) hours as needed.     hydrOXYzine (ATARAX) 25 MG tablet TAKE 1/2 TO 1 TABLET(12.5 TO 25 MG) BY MOUTH TWICE DAILY AS NEEDED FOR ANXIETY 60 tablet 2   lamoTRIgine (LAMICTAL) 200 MG tablet TAKE 1/2 TABLET(100 MG) BY MOUTH TWICE DAILY 90 tablet 0   lamoTRIgine (LAMICTAL) 25 MG tablet Take 1 tablet (25 mg total) by mouth daily. 90 tablet 0   Multiple Vitamins-Minerals (HM MULTIVITAMIN ADULT GUMMY) CHEW Chew 2 tablets by mouth daily.  thiamine (VITAMIN B-1) 100 MG tablet Take 100 mg by mouth.     topiramate (TOPAMAX) 50 MG tablet Take 100 mg by mouth 2 (two) times daily.     Vilazodone HCl (VIIBRYD) 40 MG TABS Take 1 tablet (40 mg total) by mouth daily. 90 tablet 0   [START ON 10/20/2021] zolpidem (AMBIEN CR) 12.5 MG CR tablet Take 1 tablet at bedtime 30 tablet 1   AIMOVIG 140 MG/ML SOAJ SMARTSIG:140 Milligram(s) SUB-Q Every 4 Weeks     cyanocobalamin (,VITAMIN B-12,) 1000 MCG/ML injection Inject 1,000 mcg into the muscle every 30 (thirty) days.      loratadine (CLARITIN) 10 MG tablet Take 10 mg by mouth daily. (Patient not taking: Reported on 10/12/2021)     pantoprazole (PROTONIX) 40 MG tablet Take 1 tablet (40 mg total) by mouth daily. (Patient not taking: Reported on 10/13/2021) 30 tablet 3   SUMAtriptan (IMITREX) 100 MG tablet Take 100 mg by mouth every 2 (two) hours as needed for migraine or headache.      traMADol (ULTRAM) 50 MG tablet Take by mouth. (Patient not taking: Reported on 10/06/2021)     Current Facility-Administered Medications  Medication Dose Route Frequency Provider Last Rate Last Admin    0.9 %  sodium chloride infusion  500 mL Intravenous Once Sharyn Creamer, MD        Allergies as of 10/13/2021 - Review Complete 10/13/2021  Allergen Reaction Noted   Amoxicillin Anaphylaxis, Hives, Shortness Of Breath, Swelling, and Rash 10/09/2014   Penicillins Anaphylaxis, Swelling, Rash, Shortness Of Breath, and Hives 03/01/2012   Morphine Other (See Comments) 10/09/2014   Sulfa antibiotics Nausea Only 03/01/2012    Family History  Problem Relation Age of Onset   Arthritis/Rheumatoid Mother    Heart block Mother    Clotting disorder Mother    Osteoporosis Mother    Heart attack Mother    Anxiety disorder Mother    Depression Mother    Colon polyps Mother    Stomach cancer Father 31   Heart disease Father    Heart attack Father    Depression Sister    Anxiety disorder Sister    Colon polyps Sister    Colon cancer Neg Hx    Esophageal cancer Neg Hx    Pancreatic cancer Neg Hx     Social History   Socioeconomic History   Marital status: Single    Spouse name: Not on file   Number of children: Not on file   Years of education: Not on file   Highest education level: Not on file  Occupational History   Not on file  Tobacco Use   Smoking status: Never   Smokeless tobacco: Never  Vaping Use   Vaping Use: Never used  Substance and Sexual Activity   Alcohol use: No    Alcohol/week: 0.0 standard drinks of alcohol   Drug use: No   Sexual activity: Yes    Partners: Male    Birth control/protection: Surgical  Other Topics Concern   Not on file  Social History Narrative   Not on file   Social Determinants of Health   Financial Resource Strain: Not on file  Food Insecurity: Not on file  Transportation Needs: Not on file  Physical Activity: Not on file  Stress: Not on file  Social Connections: Not on file  Intimate Partner Violence: Not on file    Physical Exam: Vital signs in last 24 hours: BP 103/62   Pulse 85  Temp 98.7 F (37.1 C)   Ht '5\' 7"'$   (1.702 m)   Wt 141 lb (64 kg)   LMP  (LMP Unknown)   SpO2 100%   BMI 22.08 kg/m  GEN: NAD EYE: Sclerae anicteric ENT: MMM CV: Non-tachycardic Pulm: No increased WOB GI: Soft NEURO:  Alert & Oriented   Christia Reading, MD [facility]   10/13/2021 1:27 PM

## 2021-10-13 NOTE — Progress Notes (Signed)
Pt in recovery with monitors in place, VSS. Report given to receiving RN. Bite guard was placed with pt awake to ensure comfort. No dental or soft tissue damage noted. 

## 2021-10-13 NOTE — Op Note (Addendum)
[facility] Patient Name: [name] Procedure Date: 10/13/2021 1:33 PM MRN: [id number] Endoscopist: Sonny Masters "[name] ,  Age: 46 Referring MD:  Date of Birth: 12-25-1975 Gender: Female Account #: [id number] Procedure:                Upper GI endoscopy Indications:              Dyspepsia, Weight loss, history of RYGB, family                            history of stomach cancer Medicines:                Monitored Anesthesia Care Procedure:                Pre-Anesthesia Assessment:                           - Prior to the procedure, a History and Physical                            was performed, and patient medications and                            allergies were reviewed. The patient's tolerance of                            previous anesthesia was also reviewed. The risks                            and benefits of the procedure and the sedation                            options and risks were discussed with the patient.                            All questions were answered, and informed consent                            was obtained. Prior Anticoagulants: The patient has                            taken no previous anticoagulant or antiplatelet                            agents. ASA Grade Assessment: II - A patient with                            mild systemic disease. After reviewing the risks                            and benefits, the patient was deemed in                            satisfactory condition to undergo the procedure.  After obtaining informed consent, the endoscope was                            passed under direct vision. Throughout the                            procedure, the patient's blood pressure, pulse, and                            oxygen saturations were monitored continuously. The                            GIF [id number] #[id number] was introduced through the                            mouth, and advanced to the  second part of duodenum.                            The upper GI endoscopy was accomplished without                            difficulty. The patient tolerated the procedure                            well. Scope In: Scope Out: Findings:                 The examined esophagus was normal. Biopsies were                            taken with a cold forceps for histology.                           Evidence of a Roux-en-Y gastrojejunostomy was                            found. The gastrojejunal anastomosis was                            characterized by healthy appearing mucosa. This was                            traversed. The pouch-to-jejunum limb was                            characterized by healthy appearing mucosa.                           A single 5 mm sessile polyp with no bleeding and no                            stigmata of recent bleeding was found in the                            cardia. The polyp was  removed with a cold snare.                            Resection and retrieval were complete.                           A single 2 mm sessile polyp with no bleeding and no                            stigmata of recent bleeding was found in the                            gastric fundus. The polyp was removed with a cold                            biopsy forceps. Resection and retrieval were                            complete.                           The examined jejunum was normal. Biopsies were                            taken with a cold forceps for histology. Complications:            No immediate complications. Estimated Blood Loss:     Estimated blood loss was minimal. Impression:               - Normal esophagus. Biopsied.                           - Roux-en-Y gastrojejunostomy with gastrojejunal                            anastomosis characterized by healthy appearing                            mucosa.                           - A single gastric polyp. Resected and  retrieved.                           - A single gastric polyp. Resected and retrieved.                           - Normal examined jejunum. Biopsied. Recommendation:           - Discharge patient to home (with escort).                           - Await pathology results.                           - Use Protonix (pantoprazole) 40 mg PO BID for 8  weeks as a trial.                           - The findings and recommendations were discussed                            with the patient.                           - Return to GI clinic in 6 weeks. Dr Georgian Co "[name]" [place],  10/13/2021 2:07:06 PM

## 2021-10-14 ENCOUNTER — Telehealth: Payer: Self-pay

## 2021-10-14 NOTE — Telephone Encounter (Signed)
  Follow up Call-     10/13/2021   12:26 PM  Call back number  Post procedure Call Back phone  # [phone]  Permission to leave phone message Yes     Patient questions:  Do you have a fever, pain , or abdominal swelling? No. Pain Score  0 *  Have you tolerated food without any problems? Yes.    Have you been able to return to your normal activities? Yes.    Do you have any questions about your discharge instructions: Diet   No. Medications  No. Follow up visit  No.  Do you have questions or concerns about your Care? No.  Actions: * If pain score is 4 or above: No action needed, pain <4.

## 2021-10-16 ENCOUNTER — Encounter: Payer: Self-pay | Admitting: Internal Medicine

## 2021-10-21 ENCOUNTER — Ambulatory Visit: Payer: Medicare Other | Admitting: Pain Medicine

## 2021-11-16 ENCOUNTER — Other Ambulatory Visit: Payer: Self-pay | Admitting: Neurology

## 2021-11-16 DIAGNOSIS — G932 Benign intracranial hypertension: Secondary | ICD-10-CM

## 2021-11-28 DIAGNOSIS — M899 Disorder of bone, unspecified: Secondary | ICD-10-CM | POA: Insufficient documentation

## 2021-11-28 DIAGNOSIS — G894 Chronic pain syndrome: Secondary | ICD-10-CM | POA: Insufficient documentation

## 2021-11-28 DIAGNOSIS — Z79899 Other long term (current) drug therapy: Secondary | ICD-10-CM | POA: Insufficient documentation

## 2021-11-28 DIAGNOSIS — Z789 Other specified health status: Secondary | ICD-10-CM | POA: Insufficient documentation

## 2021-11-28 NOTE — Progress Notes (Unsigned)
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 would be a candidate for interventional therapies. The decision will be based on the results of diagnostic studies, as well as [name]'s risk profile.  Procedure(s) under consideration:  Pending results of ordered studies      Interventional Therapies  Risk  Complexity Considerations:   Estimated body mass index is 22.08 kg/m as calculated from the following:   Height as of 10/13/21: _0  (1.702 m).   Weight as of 10/13/21: 141 lb (64 kg). WNL   Planned  Pending:   See above for possible orders   Under consideration:   Pending completion of evaluation   Completed:   None at this time   Completed by other providers:   None at this time   Therapeutic  Palliative (PRN) options:   None established      Provider-requested follow-up: No follow-ups on file.  Future Appointments  Date Time Provider [place]  11/30/2021  9:00 AM Milinda Pointer, MD [facility] None  12/01/2021   9:50 AM Sharyn Creamer, MD [facility]  12/04/2021  9:00 AM [id number]  12/14/2021  9:00 AM Ursula Alert, MD [facility] None    Note by: Gaspar Cola, MD Date: 11/30/2021; Time: 8:08 AM

## 2021-11-30 ENCOUNTER — Other Ambulatory Visit: Payer: Self-pay | Admitting: Family Medicine

## 2021-11-30 ENCOUNTER — Ambulatory Visit: Payer: Medicare Other | Attending: Pain Medicine | Admitting: Pain Medicine

## 2021-11-30 ENCOUNTER — Encounter: Payer: Self-pay | Admitting: Pain Medicine

## 2021-11-30 VITALS — BP 117/67 | HR 77 | Temp 97.3°F | Resp 16 | Ht 67.0 in | Wt 135.0 lb

## 2021-11-30 DIAGNOSIS — K912 Postsurgical malabsorption, not elsewhere classified: Secondary | ICD-10-CM | POA: Diagnosis present

## 2021-11-30 DIAGNOSIS — G894 Chronic pain syndrome: Secondary | ICD-10-CM | POA: Diagnosis present

## 2021-11-30 DIAGNOSIS — R937 Abnormal findings on diagnostic imaging of other parts of musculoskeletal system: Secondary | ICD-10-CM | POA: Diagnosis present

## 2021-11-30 DIAGNOSIS — M79641 Pain in right hand: Secondary | ICD-10-CM | POA: Insufficient documentation

## 2021-11-30 DIAGNOSIS — M79672 Pain in left foot: Secondary | ICD-10-CM | POA: Insufficient documentation

## 2021-11-30 DIAGNOSIS — Z79899 Other long term (current) drug therapy: Secondary | ICD-10-CM | POA: Insufficient documentation

## 2021-11-30 DIAGNOSIS — M79604 Pain in right leg: Secondary | ICD-10-CM | POA: Insufficient documentation

## 2021-11-30 DIAGNOSIS — M5441 Lumbago with sciatica, right side: Secondary | ICD-10-CM | POA: Diagnosis present

## 2021-11-30 DIAGNOSIS — G8929 Other chronic pain: Secondary | ICD-10-CM | POA: Diagnosis present

## 2021-11-30 DIAGNOSIS — M542 Cervicalgia: Secondary | ICD-10-CM | POA: Insufficient documentation

## 2021-11-30 DIAGNOSIS — Z789 Other specified health status: Secondary | ICD-10-CM | POA: Diagnosis present

## 2021-11-30 DIAGNOSIS — K9589 Other complications of other bariatric procedure: Secondary | ICD-10-CM | POA: Diagnosis present

## 2021-11-30 DIAGNOSIS — M79671 Pain in right foot: Secondary | ICD-10-CM | POA: Insufficient documentation

## 2021-11-30 DIAGNOSIS — Z9884 Bariatric surgery status: Secondary | ICD-10-CM | POA: Diagnosis present

## 2021-11-30 DIAGNOSIS — Z8669 Personal history of other diseases of the nervous system and sense organs: Secondary | ICD-10-CM | POA: Insufficient documentation

## 2021-11-30 DIAGNOSIS — M79642 Pain in left hand: Secondary | ICD-10-CM | POA: Insufficient documentation

## 2021-11-30 DIAGNOSIS — M899 Disorder of bone, unspecified: Secondary | ICD-10-CM | POA: Diagnosis present

## 2021-11-30 DIAGNOSIS — M25512 Pain in left shoulder: Secondary | ICD-10-CM | POA: Insufficient documentation

## 2021-11-30 DIAGNOSIS — G609 Hereditary and idiopathic neuropathy, unspecified: Secondary | ICD-10-CM | POA: Diagnosis present

## 2021-11-30 NOTE — Patient Instructions (Signed)
____________________________________________________________________________________________  Drug Holidays (Slow)  What is a "Drug Holiday"? Drug Holiday: is the name given to the period of time during which a patient stops taking a medication(s) for the purpose of eliminating tolerance to the drug.  Benefits . Improved effectiveness of opioids. . Decreased opioid dose needed to achieve benefits. . Improved pain with lesser dose.  What is tolerance? Tolerance: is the progressive decreased in effectiveness of a drug due to its repetitive use. With repetitive use, the body gets use to the medication and as a consequence, it loses its effectiveness. This is a common problem seen with opioid pain medications. As a result, a larger dose of the drug is needed to achieve the same effect that used to be obtained with a smaller dose.  How long should a "Drug Holiday" last? You should stay off of the pain medicine for at least 14 consecutive days. (2 weeks)  Should I stop the medicine "cold turkey"? No. You should always coordinate with your Pain Specialist so that he/she can provide you with the correct medication dose to make the transition as smoothly as possible.  How do I stop the medicine? Slowly. You will be instructed to decrease the daily amount of pills that you take by one (1) pill every seven (7) days. This is called a "slow downward taper" of your dose. For example: if you normally take four (4) pills per day, you will be asked to drop this dose to three (3) pills per day for seven (7) days, then to two (2) pills per day for seven (7) days, then to one (1) per day for seven (7) days, and at the end of those last seven (7) days, this is when the "Drug Holiday" would start.   Will I have withdrawals? By doing a "slow downward taper" like this one, it is unlikely that you will experience any significant withdrawal symptoms. Typically, what triggers withdrawals is the sudden stop of a high  dose opioid therapy. Withdrawals can usually be avoided by slowly decreasing the dose over a prolonged period of time. If you do not follow these instructions and decide to stop your medication abruptly, withdrawals may be possible.  What are withdrawals? Withdrawals: refers to the wide range of symptoms that occur after stopping or dramatically reducing opiate drugs after heavy and prolonged use. Withdrawal symptoms do not occur to patients that use low dose opioids, or those who take the medication sporadically. Contrary to benzodiazepine (example: Valium, Xanax, etc.) or alcohol withdrawals ("Delirium Tremens"), opioid withdrawals are not lethal. Withdrawals are the physical manifestation of the body getting rid of the excess receptors.  Expected Symptoms Early symptoms of withdrawal may include: . Agitation . Anxiety . Muscle aches . Increased tearing . Insomnia . Runny nose . Sweating . Yawning  Late symptoms of withdrawal may include: . Abdominal cramping . Diarrhea . Dilated pupils . Goose bumps . Nausea . Vomiting  Will I experience withdrawals? Due to the slow nature of the taper, it is very unlikely that you will experience any.  What is a slow taper? Taper: refers to the gradual decrease in dose.  (Last update: 09/05/2019) ____________________________________________________________________________________________     

## 2021-11-30 NOTE — Progress Notes (Signed)
Safety precautions to be maintained throughout the outpatient stay will include: orient to surroundings, keep bed in low position, maintain call bell within reach at all times, provide assistance with transfer out of bed and ambulation.  

## 2021-12-01 ENCOUNTER — Encounter: Payer: Self-pay | Admitting: Internal Medicine

## 2021-12-01 ENCOUNTER — Ambulatory Visit (INDEPENDENT_AMBULATORY_CARE_PROVIDER_SITE_OTHER): Payer: Medicare Other | Admitting: Internal Medicine

## 2021-12-01 ENCOUNTER — Other Ambulatory Visit (INDEPENDENT_AMBULATORY_CARE_PROVIDER_SITE_OTHER): Payer: Medicare Other

## 2021-12-01 VITALS — BP 110/70 | HR 85 | Ht 67.0 in | Wt 138.0 lb

## 2021-12-01 DIAGNOSIS — K219 Gastro-esophageal reflux disease without esophagitis: Secondary | ICD-10-CM | POA: Diagnosis not present

## 2021-12-01 DIAGNOSIS — R1013 Epigastric pain: Secondary | ICD-10-CM

## 2021-12-01 DIAGNOSIS — R140 Abdominal distension (gaseous): Secondary | ICD-10-CM

## 2021-12-01 DIAGNOSIS — R634 Abnormal weight loss: Secondary | ICD-10-CM

## 2021-12-01 DIAGNOSIS — D509 Iron deficiency anemia, unspecified: Secondary | ICD-10-CM

## 2021-12-01 DIAGNOSIS — G8929 Other chronic pain: Secondary | ICD-10-CM

## 2021-12-01 LAB — HEPATIC FUNCTION PANEL
ALT: 14 U/L (ref 0–35)
AST: 12 U/L (ref 0–37)
Albumin: 4.4 g/dL (ref 3.5–5.2)
Alkaline Phosphatase: 28 U/L — ABNORMAL LOW (ref 39–117)
Bilirubin, Direct: 0.1 mg/dL (ref 0.0–0.3)
Total Bilirubin: 0.4 mg/dL (ref 0.2–1.2)
Total Protein: 7.3 g/dL (ref 6.0–8.3)

## 2021-12-01 NOTE — Progress Notes (Signed)
12/01/2021 [name] [id number] 05-Aug-1975  ASSESSMENT AND PLAN:   Diarrhea with weight loss, dyspepsia.  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 Possible from EPI versus SIBO. Trial of Zenpep was not effective but patient may not have been taking it appropriately. Will test for SIBO and rule out parasite infection and pancreatic insufficiency. Patient was concerned about her nutritional status so will update her albumin and pre-albumin results. She has had recurrent vit D and vitamin B12 deficiency that has not been responsive to weekly dosing so will escalate her dosing to see if this helps with her levels. - Will plan for SIBO breath testing - Check LFTs, pre-albumin, TTG IgA, IgA - Will follow up with results of other vitamin tests - Check fecal elastase, O&P - Continue Lomotil PRN - Start vitamin D supplements 50,000 U daily for 5 days, then weekly. Then check vitamin D level in 2 weeks - Will plan to give vitamin B12 injection every other day for 2 weeks, then go back every week. Check vitamin B12 in 2 weeks - Consider further dietary changes in the future - RTC 2 months  Patient Care Team: Leonard Downing, MD as PCP - General (Family Medicine)  HISTORY OF PRESENT ILLNESS: 46 y.o. female with a past medical history of cardiomyopathy, type 2 diabetes, PTSD, iron deficiency anemia and others listed below presents for evaluation of weight loss, decreased appetite, diarrhea. 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  Interval History: She is still having some nausea. Still having dumping syndrome symptoms. She has not really seen any significant improvement on Zenpep (though she was not taking this with foods). She only uses Lomotil if she is about to go out to eat to help prevent diarrhea. If she eats anything, she will have  diarrhea and become very sweaty. She is having about 3-5 BMs per day, which has been longstanding in nature. Greasy foods seem to make it worse. She has been having more malabsorption issues. Her vitamin B12 has been slowly increasing with the B12 supplements but her last level was still low. Vit D levels are low as well and she has been on supplements for months.   Wt Readings from Last 3 Encounters:  12/01/21 138 lb (62.6 kg)  11/30/21 135 lb (61.2 kg)  10/13/21 141 lb (64 kg)   Labs/Imaging/Endoscopies: 07/15/2021 Labs reviewed show iron 128, saturation slightly increased at 32, ferritin normal at 24, normal CBC without anemia or leukocytosis.  Negative HIV, A1c 5.6, folate normal at 7.7,  B12 very low at 80, normal thyroid at 1.4 sed rate normal. Patient did have a low ferritin 12/21/2019 iron was low normal at 30 with decreased saturations.   03/05/2021 CT abdomen pelvis with contrast for left-sided abdominal pain, nausea, emesis and diarrhea history of multiple surgeries evaluate for obstruction showed postsurgical changes of gastric bypass no evidence of bowel obstruction or inflammatory bowel changes no acute abnormality.  Focal fatty disposition of the liver status postcholecystectomy without biliary dilatation.  Normal pancreas  She also had unremarkable colon 2008 at [place] and EGD 2008 and 2015 unremarkable at [facility]  01/24/2018 colonoscopy and upper endoscopy At [facility] with Dr. Alice Reichert for functional diarrhea, fecal incontinence  Colonoscopy difficult due to redundant colon, good bowel prep normal sphincter tone, normal colonoscopy nonbleeding internal hemorrhoids grade 1 biopsies unremarkable. Endoscopy shows evidence of Roux-en-Y gastrojejunostomy  healthy-appearing anastomosis, normal examined jejunum  EGD 10/13/21: - Normal esophagus. Biopsied. - Roux-en-Y gastrojejunostomy with gastrojejunal anastomosis characterized by healthy appearing mucosa. - A single gastric  polyp. Resected and retrieved. - A single gastric polyp. Resected and retrieved. - Normal examined jejunum. Biopsied. Path: 1. Surgical [P], small bowel - SMALL INTESTINAL MUCOSA WITH NO SPECIFIC HISTOPATHOLOGIC CHANGES - NEGATIVE FOR INCREASED INTRAEPITHELIAL LYMPHOCYTES OR VILLOUS ARCHITECTURAL CHANGES 2. Surgical [P], gastric - GASTRIC ANTRAL MUCOSA WITH MILD NONSPECIFIC REACTIVE GASTROPATHY - HELICOBACTER PYLORI-LIKE ORGANISMS ARE NOT IDENTIFIED ON ROUTINE H&E STAIN 3. Surgical [P], nodular gastric mucosa - GASTRIC ANTRAL AND OXYNTIC MUCOSA WITH NONSPECIFIC HYPERPLASTIC CHANGES - NEGATIVE FOR INTESTINAL METAPLASIA OR DYSPLASIA 4. Surgical [P], esophageal - ESOPHAGEAL SQUAMOUS MUCOSA WITH VASCULAR CONGESTION, SQUAMOUS BALLOONING AND FEW INTRAEPITHELIAL EOSINOPHILS (UP TO 3/HIGH POWER FIELD), CONSISTENT WITH REFLUX ESOPHAGITIS   Current Medications:      Current Outpatient Medications (Analgesics):    acetaminophen (TYLENOL) 325 MG tablet, Take 650 mg by mouth every 4 (four) hours as needed for moderate pain.    AIMOVIG 140 MG/ML SOAJ, SMARTSIG:140 Milligram(s) SUB-Q Every 4 Weeks   HYDROcodone-acetaminophen (NORCO) 7.5-325 MG tablet, Take 1 tablet by mouth every 6 (six) hours as needed.   SUMAtriptan (IMITREX) 100 MG tablet, Take 100 mg by mouth every 2 (two) hours as needed for migraine or headache.   Current Outpatient Medications (Hematological):    cyanocobalamin (,VITAMIN B-12,) 1000 MCG/ML injection, Inject 1,000 mcg into the muscle every 30 (thirty) days.   Current Outpatient Medications (Other):    buPROPion (WELLBUTRIN XL) 150 MG 24 hr tablet, Take 2 tablets (300 mg total) by mouth daily.   diphenoxylate-atropine (LOMOTIL) 2.5-0.025 MG tablet, Take 1 tablet by mouth as needed.   ergocalciferol (VITAMIN D2) 1.25 MG (50000 UT) capsule, Take 50,000 Units by mouth once a week.   hydrOXYzine (ATARAX) 25 MG tablet, TAKE 1/2 TO 1 TABLET(12.5 TO 25 MG) BY MOUTH TWICE DAILY AS  NEEDED FOR ANXIETY   lamoTRIgine (LAMICTAL) 200 MG tablet, TAKE 1/2 TABLET(100 MG) BY MOUTH TWICE DAILY   Multiple Vitamins-Minerals (HM MULTIVITAMIN ADULT GUMMY) CHEW, Chew 2 tablets by mouth daily.   pantoprazole (PROTONIX) 40 MG tablet, Take 1 tablet (40 mg total) by mouth 2 (two) times daily.   thiamine (VITAMIN B-1) 100 MG tablet, Take 100 mg by mouth.   topiramate (TOPAMAX) 50 MG tablet, Take 100 mg by mouth 2 (two) times daily.   Vilazodone HCl (VIIBRYD) 40 MG TABS, Take 1 tablet (40 mg total) by mouth daily.   zolpidem (AMBIEN CR) 12.5 MG CR tablet, Take 1 tablet at bedtime   cyclobenzaprine (FLEXERIL) 5 MG tablet,   PHYSICAL EXAM: BP 110/70   Pulse 85   Ht '5\' 7"'$  (1.702 m)   Wt 138 lb (62.6 kg)   LMP  (LMP Unknown)   BMI 21.61 kg/m  General:   Pleasant, well developed female in no acute distress Head:   Normocephalic and atraumatic. Eyes:  sclerae anicteric,conjunctive pink  Heart:   regular rate and rhythm Pulm:  Clear anteriorly; no wheezing Abdomen:   Soft, Non-distended AB, Active bowel sounds. Non-tender. Without guarding and Without rebound, No organomegaly appreciated. Rectal: Not evaluated Extremities:  Without edema. Msk: Symmetrical without gross deformities. Peripheral pulses intact.  Neurologic:  Alert and  oriented x4;  No focal deficits.  Skin:   Dry and intact without significant lesions or rashes. Psychiatric:  Cooperative. Normal mood and affect.  Sharyn Creamer, MD 10:10 AM  I spent  44 minutes of time, including in depth chart review, independent review of results as outlined above, communicating results with the patient directly, face-to-face time with the patient, coordinating care, ordering studies and medications as appropriate, and documentation.

## 2021-12-01 NOTE — Patient Instructions (Signed)
Your provider has requested that you go to the basement level for lab work before leaving today. Press "B" on the elevator. The lab is located at the first door on the left as you exit the elevator.  You have been given a testing kit to check for small intestine bacterial overgrowth (SIBO) which is completed by a company named [facility]. Make sure to return your test in the mail using the return mailing label given to you along with the kit. Your demographic and insurance information have already been sent to the company and they should be in contact with you over the next 1-2 weeks regarding this test. [facility] will collect an upfront charge of $99.74 for commercial insurance plans and $209.74 is you are paying cash. Make sure to discuss with [facility] PRIOR to having the test to see if they have gotten information from your insurance company as to how much your testing will cost out of pocket, if any. Please keep in mind that you will be getting a call from phone number [phone] or a similar number. If you do not hear from them within this time frame, please call our office at [phone] or call [facility] directly at [phone].   Change your Vitamin d to 50,000 units daily x 5 days then go back to weekly dosing.   Also ,change your Vitamin B12 to every other day dosing x 2 weeks then back to every week.   Please go to our basement lab in 2 weeks for repeat labs. You do not need an appointment.   The [facility] providers would like to encourage you to use [facility] to communicate with providers for non-urgent requests or questions.  Due to long hold times on the telephone, sending your provider a message by [facility] may be a faster and more efficient way to get a response.  Please allow 48 business hours for a response.  Please remember that this is for non-urgent requests.   Due to recent changes in healthcare laws, you may see the results of your imaging and  laboratory studies on MyChart before your provider has had a chance to review them.  We understand that in some cases there may be results that are confusing or concerning to you. Not all laboratory results come back in the same time frame and the provider may be waiting for multiple results in order to interpret others.  Please give [place] 48 hours in order for your provider to thoroughly review all the results before contacting the office for clarification of your results.

## 2021-12-02 ENCOUNTER — Other Ambulatory Visit: Payer: Self-pay | Admitting: Family Medicine

## 2021-12-02 DIAGNOSIS — Z9884 Bariatric surgery status: Secondary | ICD-10-CM

## 2021-12-02 DIAGNOSIS — M791 Myalgia, unspecified site: Secondary | ICD-10-CM

## 2021-12-02 DIAGNOSIS — E559 Vitamin D deficiency, unspecified: Secondary | ICD-10-CM

## 2021-12-02 DIAGNOSIS — Z1231 Encounter for screening mammogram for malignant neoplasm of breast: Secondary | ICD-10-CM

## 2021-12-02 LAB — IGA: Immunoglobulin A: 196 mg/dL (ref 47–310)

## 2021-12-02 LAB — PREALBUMIN: Prealbumin: 26 mg/dL (ref 17–34)

## 2021-12-02 LAB — TISSUE TRANSGLUTAMINASE, IGA: (tTG) Ab, IgA: <1 U/mL

## 2021-12-03 LAB — COMPLIANCE DRUG ANALYSIS, UR

## 2021-12-04 ENCOUNTER — Other Ambulatory Visit: Payer: Self-pay

## 2021-12-04 ENCOUNTER — Ambulatory Visit
Admission: RE | Admit: 2021-12-04 | Discharge: 2021-12-04 | Disposition: A | Discharge status: Home / Self Care | Payer: Medicare Other | Source: Ambulatory Visit | Attending: Neurology | Admitting: Neurology

## 2021-12-04 DIAGNOSIS — G932 Benign intracranial hypertension: Secondary | ICD-10-CM | POA: Insufficient documentation

## 2021-12-04 DIAGNOSIS — G43909 Migraine, unspecified, not intractable, without status migrainosus: Secondary | ICD-10-CM | POA: Diagnosis present

## 2021-12-04 LAB — PROTEIN, CSF: Total  Protein, CSF: 16 mg/dL (ref 15–45)

## 2021-12-04 LAB — CSF CELL COUNT WITH DIFFERENTIAL
Eosinophils, CSF: 0 %
Lymphs, CSF: 50 %
Monocyte-Macrophage-Spinal Fluid: 17 %
Other Cells, CSF: 6
RBC Count, CSF: 39 /mm3 — ABNORMAL HIGH (ref 0–3)
Segmented Neutrophils-CSF: 33 %
Tube #: 3
WBC, CSF: 2 /mm3 (ref 0–5)

## 2021-12-04 LAB — GLUCOSE, CSF: Glucose, CSF: 61 mg/dL (ref 40–70)

## 2021-12-04 LAB — VITAMIN K1, SERUM: VITAMIN K1: <0.1 ng/mL — ABNORMAL LOW (ref 0.10–2.20)

## 2021-12-04 MED ORDER — ACETAMINOPHEN 325 MG PO TABS: 650.0000 mg | ORAL_TABLET | ORAL | Status: DC | PRN

## 2021-12-04 MED ORDER — LIDOCAINE HCL (PF) 1 % IJ SOLN
10.0000 mL | Freq: Once | INTRAMUSCULAR | Status: AC
  Administered 2021-12-04: 5 mL via INTRADERMAL

## 2021-12-04 NOTE — Discharge Instructions (Signed)
Lumbar Puncture, Care After Refer to this sheet in the next few weeks. These instructions provide you with information on caring for yourself after your procedure. Your health care provider may also give you more specific instructions. Your treatment has been planned according to current medical practices, but problems sometimes occur. Call your health care provider if you have any problems or questions after your procedure. What can I expect after the procedure? After your procedure, it is typical to have the following sensations: Mild discomfort or pain at the insertion site. Mild headache that is relieved with pain medicines.  Follow these instructions at home:  Avoid lifting anything heavier than 10 lb (4.5 kg) for at least 12 hours after the procedure. Drink enough fluids to keep your urine clear or pale yellow. Lay flat or as flat as possible for the remainder of the day. Contact a health care provider if: You have fever or chills. You have nausea or vomiting. You have a headache that lasts for more than 2 days. Get help right away if: You have any numbness or tingling in your legs. You are unable to control your bowel or bladder. You have bleeding or swelling in your back at the insertion site. You are dizzy or faint. This information is not intended to replace advice given to you by your health care provider. Make sure you discuss any questions you have with your health care provider. Document Released: 02/06/2013 Document Revised: 07/10/2015 Document Reviewed: 10/10/2012 Elsevier Interactive Patient Education  2017 [employer] 

## 2021-12-04 NOTE — Procedures (Signed)
Technically successful fluoro guided LP at L3-L4 level with opening pressure of 13 cm H2O and closing pressure of 11 cm H2O 8 cc of clear CSF sent to lab for analysis.  No immediate post procedural complication.  Please see imaging section of Epic for full dictation.    Reatha Armour, PA-C 12/04/2021, 10:38 AM

## 2021-12-07 ENCOUNTER — Other Ambulatory Visit: Payer: Self-pay | Admitting: Psychiatry

## 2021-12-07 ENCOUNTER — Telehealth: Payer: Self-pay | Admitting: Psychiatry

## 2021-12-07 DIAGNOSIS — F410 Panic disorder [episodic paroxysmal anxiety] without agoraphobia: Secondary | ICD-10-CM

## 2021-12-07 DIAGNOSIS — F4310 Post-traumatic stress disorder, unspecified: Secondary | ICD-10-CM

## 2021-12-07 DIAGNOSIS — F3342 Major depressive disorder, recurrent, in full remission: Secondary | ICD-10-CM

## 2021-12-07 NOTE — Telephone Encounter (Signed)
Patient called in and RS appointment on 10/30 due to being double booked with a shoulder specialist. She took the next available on 12/05 but will need her medication refilled before then.  Her medications are:  buPROPion (WELLBUTRIN XL) 150 MG 24 hr tablet  lamoTRIgine (LAMICTAL) 200 MG tablet  Vilazodone HCl (VIIBRYD) 40 MG TABS  zolpidem (AMBIEN CR) 12.5 MG CR tablet  Pharmacy: [id number] [address], [place] (Ph: [phone])

## 2021-12-08 ENCOUNTER — Ambulatory Visit
Admission: RE | Admit: 2021-12-08 | Discharge: 2021-12-08 | Disposition: A | Discharge status: Home / Self Care | Payer: Medicare Other | Source: Ambulatory Visit | Attending: Pain Medicine | Admitting: Pain Medicine

## 2021-12-08 ENCOUNTER — Emergency Department
Admission: EM | Admit: 2021-12-08 | Discharge: 2021-12-08 | Disposition: A | Discharge status: Home / Self Care | Payer: Medicare Other | Attending: Emergency Medicine | Admitting: Emergency Medicine

## 2021-12-08 ENCOUNTER — Ambulatory Visit (HOSPITAL_BASED_OUTPATIENT_CLINIC_OR_DEPARTMENT_OTHER): Payer: Medicare Other | Admitting: Pain Medicine

## 2021-12-08 ENCOUNTER — Encounter: Payer: Self-pay | Admitting: Pain Medicine

## 2021-12-08 ENCOUNTER — Other Ambulatory Visit: Payer: Self-pay

## 2021-12-08 VITALS — BP 114/64 | HR 67 | Temp 97.4°F | Resp 14 | Ht 67.0 in | Wt 134.0 lb

## 2021-12-08 DIAGNOSIS — G971 Other reaction to spinal and lumbar puncture: Secondary | ICD-10-CM | POA: Insufficient documentation

## 2021-12-08 DIAGNOSIS — G8929 Other chronic pain: Secondary | ICD-10-CM | POA: Diagnosis present

## 2021-12-08 DIAGNOSIS — R937 Abnormal findings on diagnostic imaging of other parts of musculoskeletal system: Secondary | ICD-10-CM | POA: Diagnosis present

## 2021-12-08 DIAGNOSIS — M79672 Pain in left foot: Secondary | ICD-10-CM | POA: Diagnosis present

## 2021-12-08 DIAGNOSIS — M79671 Pain in right foot: Secondary | ICD-10-CM | POA: Diagnosis present

## 2021-12-08 DIAGNOSIS — R519 Headache, unspecified: Secondary | ICD-10-CM | POA: Diagnosis present

## 2021-12-08 DIAGNOSIS — Z8669 Personal history of other diseases of the nervous system and sense organs: Secondary | ICD-10-CM

## 2021-12-08 DIAGNOSIS — M5441 Lumbago with sciatica, right side: Secondary | ICD-10-CM | POA: Diagnosis present

## 2021-12-08 DIAGNOSIS — M79604 Pain in right leg: Secondary | ICD-10-CM | POA: Diagnosis present

## 2021-12-08 MED ORDER — LIDOCAINE HCL 2 % IJ SOLN
20.0000 mL | Freq: Once | INTRAMUSCULAR | Status: AC
  Administered 2021-12-08: 400 mg

## 2021-12-08 MED ORDER — LIDOCAINE HCL 2 % IJ SOLN
INTRAMUSCULAR | Status: AC
  Filled 2021-12-08: qty 20

## 2021-12-08 MED ORDER — SODIUM CHLORIDE 0.9% FLUSH
10.0000 mL | Freq: Once | INTRAVENOUS | Status: AC
  Administered 2021-12-08: 10 mL

## 2021-12-08 MED ORDER — LACTATED RINGERS IV SOLN: Freq: Once | INTRAVENOUS | Status: AC

## 2021-12-08 MED ORDER — ONDANSETRON 4 MG PO TBDP
4.0000 mg | ORAL_TABLET | Freq: Once | ORAL | Status: AC
  Administered 2021-12-08: 4 mg via ORAL
  Filled 2021-12-08: qty 1

## 2021-12-08 MED ORDER — IBUPROFEN 400 MG PO TABS
400.0000 mg | ORAL_TABLET | Freq: Once | ORAL | Status: DC
  Filled 2021-12-08: qty 1

## 2021-12-08 MED ORDER — PENTAFLUOROPROP-TETRAFLUOROETH EX AERO: INHALATION_SPRAY | Freq: Once | CUTANEOUS | Status: DC

## 2021-12-08 MED ORDER — SODIUM CHLORIDE (PF) 0.9 % IJ SOLN
INTRAMUSCULAR | Status: AC
  Filled 2021-12-08: qty 10

## 2021-12-08 MED ORDER — ACETAMINOPHEN-CAFFEINE 500-65 MG PO TABS
1.0000 | ORAL_TABLET | Freq: Once | ORAL | Status: AC
  Administered 2021-12-08: 1 via ORAL
  Filled 2021-12-08: qty 1

## 2021-12-08 NOTE — Progress Notes (Signed)
PROVIDER NOTE: Interpretation of information contained herein should be left to medically-trained personnel. Specific patient instructions are provided elsewhere under "Patient Instructions" section of medical record. This document was created in part using STT-dictation technology, any transcriptional errors that may result from this process are unintentional.  Patient: [name] Type: Established DOB: [birth year] MRN: [id number] PCP: Kaleen Mask, MD  Service: Procedure DOS: 12/08/2021 Setting: Ambulatory Location: Ambulatory outpatient facility Delivery: Face-to-face Provider: Oswaldo Done, MD Specialty: Interventional Pain Management Specialty designation: 09 Location: Outpatient facility Ref. Prov.: Kaleen Mask, *     Primary Reason for Visit: Interventional Pain Management Treatment. CC: Other (Post lumbar puncture headache )   Interventional Treatment          Procedure: Epidural blood patch Laterality: Midline Paramedial  Level: Lumbosacral   No./Series: Not applicable  Type: Epidural injection Purpose: Therapeutic  Region: Posterior Lumbosacral  Target Area: Posterior epidural space Approach: Percutaneous        Analgesic Technique:  Local Anesthesia only  Type: Local Anesthesia Local Anesthetic: Lidocaine 1-2% Sedation: None  Indication(s):  Analgesia Route: Infiltration ([place]/IM) IV Access: N/A         Position / Prep / Materials:  Position: Prone   Prep solution: DuraPrep (Iodine Povacrylex [0.7% available iodine] and Isopropyl Alcohol, 74% w/w)  Materials:       Tray: Epidural      Needle(s):  Type:  Epidural   Gauge (G):  17   Length:  3.5   Qty:  1    Prep Area: Entire  Lower  Lumbar  Area          1. Postdural puncture headache   2. History of idiopathic intracranial hypertension    NAS-11 Pain score:   Pre-procedure: 10-Worst pain ever (when laying down pain level is approx 3)/10   Post-procedure: 0-No pain/10    Pre-op H&P Assessment:  [name] is a 46 y.o. (year old), female patient, seen today for interventional treatment. She  has a past surgical history that includes Gastric bypass (2010); Cholecystectomy; Hernia repair; gi bleed (2009); bowel obstruction (2011); Vaginal hysterectomy (N/A, 09/29/2015); Bilateral salpingectomy (Bilateral, 09/29/2015); Ovarian cyst removal (Left, 09/29/2015); Abdominal hysterectomy; Dilation and curettage of uterus; Esophagogastroduodenoscopy; Roux-en-y procedure; Esophagogastroduodenoscopy (egd) with propofol (N/A, 01/24/2018); and Colonoscopy with propofol (N/A, 01/24/2018). [name] has a current medication list which includes the following prescription(s): acetaminophen, aimovig, bupropion, cyanocobalamin, diphenoxylate-atropine, ergocalciferol, hydroxyzine, lamotrigine, hm multivitamin adult gummy, pantoprazole, sumatriptan, thiamine, topiramate, vilazodone hcl, zolpidem, cyclobenzaprine, and hydrocodone-acetaminophen, and the following Facility-Administered Medications: pentafluoroprop-tetrafluoroeth. Her primarily concern today is the Other (Post lumbar puncture headache )  Initial Vital Signs:  Pulse/HCG Rate: 66ECG Heart Rate: 70 Temp: (!) 97.4 F (36.3 C) Resp: 16 BP: 111/67 SpO2: 100 %  BMI: Estimated body mass index is 20.99 kg/m as calculated from the following:   Height as of this encounter: 5\' 7"  (1.702 m).   Weight as of this encounter: 134 lb (60.8 kg).  Risk Assessment: Allergies: Reviewed. She is allergic to amoxicillin, penicillins, morphine, and sulfa antibiotics.  Allergy Precautions: None required Coagulopathies: Reviewed. None identified.  Blood-thinner therapy: None at this time Active Infection(s): Reviewed. None identified. [name] is afebrile  Site Confirmation: [name] was asked to confirm the procedure and laterality before marking the site Procedure checklist: Completed Consent: Before the procedure and under  the influence of no sedative(s), amnesic(s), or anxiolytics, the patient was informed of the treatment options, risks and possible complications. To fulfill our ethical  and legal obligations, as recommended by the American Medical Association's Code of Ethics, I have informed the patient of my clinical impression; the nature and purpose of the treatment or procedure; the risks, benefits, and possible complications of the intervention; the alternatives, including doing nothing; the risk(s) and benefit(s) of the alternative treatment(s) or procedure(s); and the risk(s) and benefit(s) of doing nothing. The patient was provided information about the general risks and possible complications associated with the procedure. These may include, but are not limited to: failure to achieve desired goals, infection, bleeding, organ or nerve damage, allergic reactions, paralysis, and death. In addition, the patient was informed of those risks and complications associated to Spine-related procedures, such as failure to decrease pain; infection (i.e.: Meningitis, epidural or intraspinal abscess); bleeding (i.e.: epidural hematoma, subarachnoid hemorrhage, or any other type of intraspinal or peri-dural bleeding); organ or nerve damage (i.e.: Any type of peripheral nerve, nerve root, or spinal cord injury) with subsequent damage to sensory, motor, and/or autonomic systems, resulting in permanent pain, numbness, and/or weakness of one or several areas of the body; allergic reactions; (i.e.: anaphylactic reaction); and/or death. Furthermore, the patient was informed of those risks and complications associated with the medications. These include, but are not limited to: allergic reactions (i.e.: anaphylactic or anaphylactoid reaction(s)); adrenal axis suppression; blood sugar elevation that in diabetics may result in ketoacidosis or comma; water retention that in patients with history of congestive heart failure may result in  shortness of breath, pulmonary edema, and decompensation with resultant heart failure; weight gain; swelling or edema; medication-induced neural toxicity; particulate matter embolism and blood vessel occlusion with resultant organ, and/or nervous system infarction; and/or aseptic necrosis of one or more joints. Finally, the patient was informed that Medicine is not an exact science; therefore, there is also the possibility of unforeseen or unpredictable risks and/or possible complications that may result in a catastrophic outcome. The patient indicated having understood very clearly. We have given the patient no guarantees and we have made no promises. Enough time was given to the patient to ask questions, all of which were answered to the patient's satisfaction. Ms. Tandon has indicated that she wanted to continue with the procedure. Attestation: I, the ordering provider, attest that I have discussed with the patient the benefits, risks, side-effects, alternatives, likelihood of achieving goals, and potential problems during recovery for the procedure that I have provided informed consent. Date  Time: 12/08/2021 10:12 AM  Pre-Procedure Preparation:  Monitoring: As per clinic protocol. Respiration, ETCO2, SpO2, BP, heart rate and rhythm monitor placed and checked for adequate function Safety Precautions: Patient was assessed for positional comfort and pressure points before starting the procedure. Time-out: I initiated and conducted the "Time-out" before starting the procedure, as per protocol. The patient was asked to participate by confirming the accuracy of the "Time Out" information. Verification of the correct person, site, and procedure were performed and confirmed by me, the nursing staff, and the patient. "Time-out" conducted as per Joint Commission's Universal Protocol (UP.01.01.01). Time: 1115  Description of Procedure:        vs Description of Procedure:       Area Prepped: Entire      Region Prepping solution: ChloraPrep (2% chlorhexidine gluconate and 70% isopropyl alcohol) Safety Precautions: Aspiration looking for blood return was conducted prior to all injections. At no point did we inject any substances, as a needle was being advanced. No attempts were made at seeking any paresthesias. Safe injection practices and needle disposal techniques used.  Medications properly checked for expiration dates. SDV (single dose vial) medications used. Technical process: Protocol guidelines were followed. The patient was positioned over the fluoroscopy table. The target area was identified, using fluoroscopy, and the area prepped in the usual manner. Skin (epidermis, dermis, and hypodermis), as well as deeper tissues (fat, connective tissue and muscle) were infiltrated with a short acting local anesthetic (PF-Lidocaine 2.0%) (1.24mL/level), using a 10cc syringe with a 25G needle (unused portion was discarded).  Appropriate amount of time allowed to pass for local anesthetics to take effect.    Procedural Technique Safety Precautions: Aspiration looking for blood return was conducted prior to all injections. At no point did we inject any substances, as a needle was being advanced. No attempts were made at seeking any paresthesias. Safe injection practices and needle disposal techniques used. Medications properly checked for expiration dates. SDV (single dose vial) medications used. Description of the Procedure: Protocol guidelines were followed. The patient was assisted into a comfortable position. The target area was identified and the area prepped in the usual manner. Skin & deeper tissues infiltrated with local anesthetic. Appropriate amount of time allowed to pass for local anesthetics to take effect. The procedure needles were then advanced to the target area. Proper needle placement secured. Negative aspiration confirmed. Solution injected in intermittent fashion, asking for systemic symptoms  every 0.5cc of injectate. The needles were then removed and the area cleansed, making sure to leave some of the prepping solution back to take advantage of its long term bactericidal properties.vsDescription of the Procedure: Protocol guidelines were followed. The patient was placed in position over the procedure table. The patient was assisted into a comfortable position. The target area was identified and the area prepped in the usual manner. Skin & deeper tissues infiltrated with local anesthetic. Appropriate amount of time allowed to pass for local anesthetics to take effect. The procedure needles were then advanced to the target area. Proper needle placement secured. Negative aspiration confirmed. Solution injected in intermittent fashion, asking for systemic symptoms every 0.5cc of injectate. The needles were then removed and the area cleansed, making sure to leave some of the prepping solution back to take advantage of its long term bactericidal properties.  Vitals:   12/08/21 1120 12/08/21 1125 12/08/21 1135 12/08/21 1203  BP: (!) 141/86 135/69 116/75 114/64  Pulse: 67     Resp: 18 18 14 14   Temp:      TempSrc:      SpO2: 100% 100% 100% 100%  Weight:      Height:         Start Time: 1115 hrs. End Time: 1125 hrs. Materials:  Needle(s) Type: Spinal Needle Gauge: 22G Length: 3.5-in Medication(s): Please see orders for medications and dosing details. vsMaterials:  Needle(s) Type: Epidural needle Gauge: 20G Length: 3.5-in Medication(s): Please see orders for medications and dosing details.  Imaging Guidance:          Type of Imaging Technique: Fluoroscopy Guidance (Spinal) Indication(s): Assistance in needle guidance and placement for procedures requiring needle placement in or near specific anatomical locations not easily accessible without such assistance. Exposure Time: Please see nurses notes. Contrast: None used. Fluoroscopic Guidance: I was personally present during the use  of fluoroscopy. "Tunnel Vision Technique" used to obtain the best possible view of the target area. Parallax error corrected before commencing the procedure. "Direction-depth-direction" technique used to introduce the needle under continuous pulsed fluoroscopy. Once target was reached, antero-posterior, oblique, and lateral fluoroscopic projection used confirm needle placement  in all planes. Images permanently stored in EMR. Ultrasound Guidance: N/A Interpretation: I personally interpreted the imaging intraoperatively. Adequate needle placement confirmed in multiple planes. Appropriate spread of contrast into desired area was observed. No evidence of afferent or efferent intravascular uptake. No intrathecal or subarachnoid spread observed. Permanent images saved into the patient's record.  Post-operative Assessment:  Post-procedure Vital Signs:  Pulse/HCG Rate: 6766 Temp: (!) 97.4 F (36.3 C) Resp: 14 BP: 114/64 SpO2: 100 %  EBL: None  Complications: No immediate post-treatment complications observed by team, or reported by patient.  Note: The patient tolerated the entire procedure well. A repeat set of vitals were taken after the procedure and the patient was kept under observation following institutional policy, for this type of procedure. Post-procedural neurological assessment was performed, showing return to baseline, prior to discharge. The patient was provided with post-procedure discharge instructions, including a section on how to identify potential problems. Should any problems arise concerning this procedure, the patient was given instructions to immediately contact us, at any time, without hesitation. In any case, we plan to contact the patient by telephone for a follow-up status report regarding this interventional procedure.  Comments:  No additional relevant information.  Plan of Care  Orders:  Orders Placed This Encounter  Procedures   EPIDURAL BLOOD PATCH    Scheduling  Instructions:     Side: Midline     Level: L3-4     Sedation: No Sedation.     Timeframe: Today    Order Specific Question:   Where will this procedure be performed?    Answer:   [facility]   DG [facility] C-ARM 1-60 MIN NO REPORT    Intraoperative interpretation by procedural physician at [facility].    Standing Status:   Standing    Number of Occurrences:   1    Order Specific Question:   Reason for exam:    Answer:   Assistance in needle guidance and placement for procedures requiring needle placement in or near specific anatomical locations not easily accessible without such assistance.   Informed Consent Details: Physician/Practitioner Attestation; Transcribe to consent form and obtain patient signature    Order Specific Question:   Physician/Practitioner attestation of informed consent for procedure/surgical case    Answer:   I, the physician/practitioner, attest that I have discussed with the patient the benefits, risks, side effects, alternatives, likelihood of achieving goals and potential problems during recovery for the procedure that I have provided informed consent.    Order Specific Question:   Procedure    Answer:   Lumbar epidural blood patch under fluoroscopic guidance    Order Specific Question:   Physician/Practitioner performing the procedure    Answer:   Zebedee Segundo A. Laban Emperor, MD    Order Specific Question:   Indication/Reason    Answer:   Postdural puncture headache   Provide equipment / supplies at bedside    "Epidural Tray" (Disposable  single use) Catheter: NOT required    Standing Status:   Standing    Number of Occurrences:   1    Order Specific Question:   Specify    Answer:   Epidural Tray   Chronic Opioid Analgesic:  Hydrocodone/APAP 7.5/325, 1 tab p.o. 4 times daily (# 120) (last filled on 11/12/2021) MME/day: 30 mg/day   Medications ordered for procedure: Meds ordered this encounter  Medications   lidocaine (XYLOCAINE) 2 %  (with pres) injection 400 mg   pentafluoroprop-tetrafluoroeth (GEBAUERS) aerosol   lactated  ringers infusion   sodium chloride flush (NS) 0.9 % injection 10 mL   Medications administered: We administered lidocaine, lactated ringers, and sodium chloride flush.  See the medical record for exact dosing, route, and time of administration.  Follow-up plan:   Return for scheduled encounter.       Interventional Therapies  Risk  Complexity Considerations:   Estimated body mass index is 21.14 kg/m as calculated from the following:   Height as of this encounter: 5\' 7"  (1.702 m).   Weight as of this encounter: 135 lb (61.2 kg). WNL   Planned  Pending:   See above for possible orders   Under consideration:   Pending completion of evaluation   Completed:   Therapeutic L3-4 epidural blood patch x1 (12/08/2021)    Completed by other providers:   None at this time   Therapeutic  Palliative (PRN) options:   None established     Recent Visits Date Type Provider Dept  11/30/21 Office Visit Delano Metz, MD [facility]  Showing recent visits within past 90 days and meeting all other requirements Today's Visits Date Type Provider Dept  12/08/21 Procedure visit Delano Metz, MD [facility]  Showing today's visits and meeting all other requirements Future Appointments Date Type Provider Dept  01/11/22 Appointment Delano Metz, MD [facility]  Showing future appointments within next 90 days and meeting all other requirements  Disposition: Discharge home  Discharge (Date  Time): 12/08/2021;   hrs.   Primary Care Physician: Kaleen Mask, MD Location: [facility] Note by: Oswaldo Done, MD Date: 12/08/2021; Time: 1:16 PM  Disclaimer:  Medicine is not an exact science. The only guarantee in medicine is that nothing is guaranteed. It is important to note that the decision to proceed with this  intervention was based on the information collected from the patient. The Data and conclusions were drawn from the patient's questionnaire, the interview, and the physical examination. Because the information was provided in large part by the patient, it cannot be guaranteed that it has not been purposely or unconsciously manipulated. Every effort has been made to obtain as much relevant data as possible for this evaluation. It is important to note that the conclusions that lead to this procedure are derived in large part from the available data. Always take into account that the treatment will also be dependent on availability of resources and existing treatment guidelines, considered by other Pain Management Practitioners as being common knowledge and practice, at the time of the intervention. For Medico-Legal purposes, it is also important to point out that variation in procedural techniques and pharmacological choices are the acceptable norm. The indications, contraindications, technique, and results of the above procedure should only be interpreted and judged by a Board-Certified Interventional Pain Specialist with extensive familiarity and expertise in the same exact procedure and technique.

## 2021-12-08 NOTE — Discharge Instructions (Addendum)
-  Please report directly to [facility].  [address], [place], [place] [address]. They will be expecting you.  You may call them at [phone] if needed.  -You may continue to treat your symptoms with acetaminophen and caffeine as needed.  -Return to the emergency department anytime if you begin to experience any new or worsening symptoms.

## 2021-12-08 NOTE — ED Provider Notes (Signed)
[facility] Provider Note    Event Date/Time   First MD Initiated Contact with Patient 12/08/21 [phone]     (approximate)   History   Chief Complaint Headache   HPI [name] is a 46 y.o. female, history of chronic pain syndrome, cardiomyopathy, anxiety, depression, obesity, iron deficiency anemia, PTSD, MDD, presents to the emergency department for evaluation of headache.  Patient states that she reportedly had a lumbar puncture this past Friday.  The next day, she reportedly developed a diffuse headache.  She has been treating with Tylenol with very little relief.  She states that it is significantly worse whenever she is sitting upright, and relieved when laying supine.  A lumbar puncture was ordered by her neurologist in order to evaluate for recurrent migraines.  Denies fever/chills, neck pain/stiffness, cough, congestion, chest pain, shortness of breath, abdominal pain, vomiting, dizziness/lightheadedness, vertigo, numbness/tingling upper or lower extremities, vision changes, hearing changes, or rash/lesions.  History Limitations: No limitations.        Physical Exam  Triage Vital Signs: ED Triage Vitals  Enc Vitals Group     BP --      Pulse --      Resp --      Temp --      Temp src --      SpO2 --      Weight 12/08/21 0816 134 lb 14.7 oz (61.2 kg)     Height 12/08/21 0816 '5\' 7"'$  (1.702 m)     Head Circumference --      Peak Flow --      Pain Score 12/08/21 0814 10     Pain Loc --      Pain Edu? --      Excl. in [place]? --     Most recent vital signs: Vitals:   12/08/21 0819 12/08/21 0935  BP: (!) 119/47 117/75  Pulse: 75 66  Resp: 20 16  Temp: (!) 97.5 F (36.4 C)   SpO2: 99% 100%    General: Awake, NAD.  Skin: Warm, dry. No rashes or lesions.  Eyes: PERRL. Conjunctivae normal.  CV: Good peripheral perfusion.  Resp: Normal effort.  Abd: Soft, non-tender. No distention.  Neuro: At baseline. No gross neurological deficits.   No facial droop.  Cranial nerves II through XII intact.  Normal strength and sensation in both upper and lower extremities. Musculoskeletal: Normal ROM of all extremities.  Focused Exam: No nuchal rigidity.  Physical Exam    ED Results / Procedures / Treatments  Labs (all labs ordered are listed, but only abnormal results are displayed) Labs Reviewed - No data to display   EKG N/A.    RADIOLOGY  ED Provider Interpretation: N/A.  No results found.  PROCEDURES:  Critical Care performed: N/A.  Procedures    MEDICATIONS ORDERED IN ED: Medications  ibuprofen (ADVIL) tablet 400 mg (400 mg Oral Patient Refused/Not Given 12/08/21 0859)  acetaminophen-caffeine (EXCEDRIN TENSION HEADACHE) 500-65 MG per tablet 1 tablet (1 tablet Oral Given 12/08/21 0857)  ondansetron (ZOFRAN-ODT) disintegrating tablet 4 mg (4 mg Oral Given 12/08/21 0858)     IMPRESSION / MDM / ASSESSMENT AND PLAN / ED COURSE  I reviewed the triage vital signs and the nursing notes.                              Differential diagnosis includes, but is not limited to, tension headache, migraine headache, post  LP headache, cluster headache.  Assessment/Plan Patient presents with persistent pressure-like headache x3 days.  She had a lumbar puncture ordered by her neurologist to evaluate for reported recurrent migraines 4 days ago.  She appears well clinically.  History and physical exam not suggestive of subarachnoid hemorrhage or meningitis.  Reviewed the results of the lumbar puncture performed on 12/04/2021, which showed RBCs, but otherwise no evidence of infection.  I suspect that this is likely a post LP headache that she is experiencing.  Treated her here with ondansetron and acetaminophen/caffeine.  Spoke with [facility], who advised that her regular pain doctor, Dr. Dossie Arbour would see her in clinic today and determine the need for blood patching.  I believe this is reasonable.  Spoke with  the patient about this, who agreed to see them today.  Will discharge.  Provided the patient with anticipatory guidance, return precautions, and educational material. Encouraged the patient to return to the emergency department at any time if they begin to experience any new or worsening symptoms. Patient expressed understanding and agreed with the plan.   Patient's presentation is most consistent with acute, uncomplicated illness.       FINAL CLINICAL IMPRESSION(S) / ED DIAGNOSES   Final diagnoses:  Post lumbar puncture headache     Rx / DC Orders   ED Discharge Orders     None        Note:  This document was prepared using Dragon voice recognition software and may include unintentional dictation errors.   Teodoro Spray, [place] 12/08/21 1003    Lavonia Drafts, MD 12/08/21 1010

## 2021-12-08 NOTE — ED Notes (Signed)
Pt given morning meds and tolerated well. Pt cannot take ibuprofen due to hx of GI bleeds. Pt ambulated to bathroom well with a steady gait.

## 2021-12-08 NOTE — Progress Notes (Signed)
PROVIDER NOTE: Information contained herein reflects review and annotations entered in association with encounter. Interpretation of such information and data should be left to medically-trained personnel. Information provided to patient can be located elsewhere in the medical record under "Patient Instructions". Document created using STT-dictation technology, any transcriptional errors that may result from process are unintentional.    Patient: [name]  Service Category: E/M  Provider: Gaspar Cola, MD  DOB: 04/08/1975  DOS: 12/08/2021  Referring Provider: Leonard Downing, *  MRN: [id number]  Specialty: Interventional Pain Management  PCP: Leonard Downing, MD  Type: Established Patient  Setting: Ambulatory outpatient    Location: Office  Delivery: Face-to-face     HPI  [name], a 46 y.o. 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 Ms. Hoheisel primary complain today is Other (Post lumbar puncture headache ) Last encounter: My last encounter with her was on 11/30/2021. Pertinent problems: Ms. Weinrich has Achilles bursitis; Achilles tendinitis; Chronic pain associated with significant psychosocial dysfunction; DDD (degenerative disc disease), lumbar; Disorder of sacrum; DDD (degenerative disc disease), lumbosacral; Arthropathy of lumbar facet joint; Chronic low back pain (2ry area of Pain) (Right) w/ sciatica (Right); Lumbar and sacral osteoarthritis; Abdominal pain, right upper quadrant; Arthralgia, sacroiliac; Degeneration of intervertebral disc of lumbosacral region; Degeneration of intervertebral disc of lumbar region; Abdominal pain, generalized; Neuropathy; Cervical radiculopathy; History of idiopathic intracranial hypertension; Abnormal MRI, lumbar spine (01/28/2017); History of bariatric surgery; Idiopathic small fiber peripheral neuropathy (1ry area of Pain) (feet> hands); Chronic feet pain (Bilateral);  Chronic hand pain (Bilateral); Chronic lower extremity pain (3ry area of Pain) (Right) (intermittent); Chronic neck pain (4th area of Pain) (Midline) (Left); Cervicalgia; Chronic shoulder pain (5th area of Pain) (Left); Complications of bariatric procedures; Postoperative intestinal malabsorption; and Postdural puncture headache on their pertinent problem list. Pain Assessment: Severity of Acute pain is reported as a 10-Worst pain ever (when laying down pain level is approx 3)/10. Location: Head Other (Comment)/denies. Onset: In the past 7 days (lumbar puncture last Friday). Quality: Constant, Discomfort, Headache. Timing: Constant. Modifying factor(s): laying down. Vitals:  height is 5' 7"  (1.702 m) and weight is 134 lb (60.8 kg). Her temporal temperature is 97.4 F (36.3 C) (abnormal). Her blood pressure is 111/67 and her pulse is 66. Her respiration is 16 and oxygen saturation is 100%.   Reason for encounter:  The patient was added to the schedule today after having had a diagnostic lumbar puncture apparently done by radiology Neysa Bonito, James Ivanoff, [place]) on 12/04/2021.  The patient returned to ED today 12/08/2021 with a complaint of possible lumbar puncture headache.  The patient apparently had been sent to radiology for the diagnostic lumbar puncture by Dr. [facility] neurology) due to a diagnosis of idiopathic intracranial hypertension with headache.  According to a brief note by interventional radiology, the lumbar puncture was done at the L3-4 level with an opening pressure of 13 cm of water and closing pressure of 11 cm of water.  8 mL of clear CSF was sent to the lab for analysis.  Glucose level was within normal limits, protein levels were within normal limits.  CSF cell count shows an elevated red blood cell count which could have been secondary to procedural trauma.  Physical exam: The patient describes worsening of the headache with standing (10/10) when compared to laying flat on her back  (3/10).  She denies any fever and although she does have a history of  migraine headaches she describes this 1 to be completely different.  The patient has indicated being interested in having an epidural blood patch today.  Obviously this was not on today's schedule.  Today the patient was informed of the risk and possible complications of the epidural blood patch.  The patient understood and accepted.  Pharmacotherapy Assessment  Analgesic: No chronic opioid analgesics therapy prescribed by our practice. Hydrocodone/APAP 7.5/325, 1 tab p.o. 4 times daily (# 120) (last filled on 11/12/2021) MME/day: 30 mg/day   Monitoring: [place] PMP: PDMP reviewed during this encounter.       Pharmacotherapy: No side-effects or adverse reactions reported. Compliance: No problems identified. Effectiveness: Clinically acceptable.  Janett Billow, RN  12/08/2021 10:12 AM  Sign when Signing Visit Safety precautions to be maintained throughout the outpatient stay will include: orient to surroundings, keep bed in low position, maintain call bell within reach at all times, provide assistance with transfer out of bed and ambulation.     No results found for: "CBDTHCR" No results found for: "D8THCCBX" No results found for: "D9THCCBX"  UDS:  Summary  Date Value Ref Range Status  11/30/2021 Note  Final    Comment:    ==================================================================== Compliance Drug Analysis, Ur ==================================================================== Test                             Result       Flag       Units  Drug Present   Topiramate                     PRESENT   Lamotrigine                    PRESENT   Zolpidem                       PRESENT   Zolpidem Acid                  PRESENT    Zolpidem acid is an expected metabolite of zolpidem.    Bupropion                      PRESENT   Hydroxybupropion               PRESENT    Hydroxybupropion is an expected metabolite of  bupropion.    Vilazodone                     PRESENT   Acetaminophen                  PRESENT ==================================================================== Test                      Result    Flag   Units      Ref Range   Creatinine              36               mg/dL      >=20 ==================================================================== Declared Medications:  Medication list was not provided. ==================================================================== For clinical consultation, please call [phone]. ====================================================================       ROS  Constitutional: Denies any fever or chills Gastrointestinal: No reported hemesis, hematochezia, vomiting, or acute GI distress Musculoskeletal: Denies any acute onset joint swelling, redness, loss of ROM, or weakness Neurological: No  reported episodes of acute onset apraxia, aphasia, dysarthria, agnosia, amnesia, paralysis, loss of coordination, or loss of consciousness  Medication Review  Erenumab-aooe, HM Multivitamin Adult Gummy, HYDROcodone-acetaminophen, SUMAtriptan, Vilazodone HCl, acetaminophen, buPROPion, cyanocobalamin, cyclobenzaprine, diphenoxylate-atropine, ergocalciferol, hydrOXYzine, lamoTRIgine, pantoprazole, thiamine, topiramate, and zolpidem  History Review  Allergy: Ms. Spranger is allergic to amoxicillin, penicillins, morphine, and sulfa antibiotics. Drug: Ms. Perella  reports no history of drug use. Alcohol:  reports no history of alcohol use. Tobacco:  reports that she has never smoked. She has never used smokeless tobacco. Social: Ms. Beauchesne  reports that she has never smoked. She has never used smokeless tobacco. She reports that she does not drink alcohol and does not use drugs. Medical:  has a past medical history of Abnormal ECG, Anemia, Anxiety, Arthritis, Asthma, B12 deficiency, Cardiomyopathy ([place]), Chronic abdominal pain, Complication of  anesthesia, COVID-19 (01/18/2019), DDD (degenerative disc disease), lumbar, Depression, Diabetes mellitus without complication ([place]), Dysrhythmia, GERD (gastroesophageal reflux disease), Iron deficiency, Migraine headache, Neuropathy, Obesity, Ovarian cyst, Pneumonia, PTSD (post-traumatic stress disorder), PTSD (post-traumatic stress disorder), Rh negative status during pregnancy, Seizures ([place]), and Small bowel obstruction ([place]). Surgical: Ms. Repetto  has a past surgical history that includes Gastric bypass (2010); Cholecystectomy; Hernia repair; gi bleed (2009); bowel obstruction (2011); Vaginal hysterectomy (N/A, 09/29/2015); Bilateral salpingectomy (Bilateral, 09/29/2015); Ovarian cyst removal (Left, 09/29/2015); Abdominal hysterectomy; Dilation and curettage of uterus; Esophagogastroduodenoscopy; Roux-en-y procedure; Esophagogastroduodenoscopy (egd) with propofol (N/A, 01/24/2018); and Colonoscopy with propofol (N/A, 01/24/2018). Family: family history includes Anxiety disorder in her mother and sister; Arthritis/Rheumatoid in her mother; Clotting disorder in her mother; Colon polyps in her mother and sister; Depression in her mother and sister; Heart attack in her father and mother; Heart block in her mother; Heart disease in her father; Osteoporosis in her mother; Stomach cancer (age of onset: 78) in her father.  Laboratory Chemistry Profile   Renal Lab Results  Component Value Date   BUN 8 11/30/2021   CREATININE 0.68 11/30/2021   BCR 12 11/30/2021   GFRAA >60 04/26/2018   GFRNONAA >60 03/13/2021    Hepatic Lab Results  Component Value Date   AST 12 12/01/2021   ALT 14 12/01/2021   ALBUMIN 4.4 12/01/2021   ALKPHOS 28 (L) 12/01/2021   LIPASE 45 03/13/2021    Electrolytes Lab Results  Component Value Date   NA 141 11/30/2021   K 4.3 11/30/2021   CL 107 (H) 11/30/2021   CALCIUM 9.5 11/30/2021   MG 2.2 11/30/2021   PHOS 4.1 01/20/2009    Bone Lab Results  Component Value Date    25OHVITD1 WILL FOLLOW 11/30/2021   25OHVITD2 WILL FOLLOW 11/30/2021   25OHVITD3 WILL FOLLOW 11/30/2021    Inflammation (CRP: Acute Phase) (ESR: Chronic Phase) Lab Results  Component Value Date   CRP <1 11/30/2021   ESRSEDRATE 3 11/30/2021   LATICACIDVEN 0.5 05/19/2009         Note: Above Lab results reviewed.  Recent Imaging Review  DG FLUORO GUIDED LOC OF NEEDLE/CATH TIP FOR SPINAL INJECT LT CLINICAL DATA:  Patient with history of migraines since 2014. Ordering provider reports possible idiopathic intracranial hypertension. Patient reports ordering provider is concerned about inflammation. Request is for image guided diagnostic lumbar puncture.  EXAM: DIAGNOSTIC LUMBAR PUNCTURE UNDER FLUOROSCOPIC GUIDANCE  COMPARISON:  None Available.  FLUOROSCOPY: Radiation Exposure Index (as provided by the fluoroscopic device): 1.70 mGy Kerma  PROCEDURE: Procedure was performed by Reatha Armour, PA-C and supervised by Darrin Nipper, MD. Informed consent was obtained from the patient  prior to the procedure, including potential complications of headache, allergy, and pain. With the patient prone, the lower back was prepped with Betadine. 1% Lidocaine was used for local anesthesia. Lumbar puncture was performed at the L3-L4 level using a 20 gauge needle with return of clear CSF with an opening pressure of 13 cm water. 8 ml of CSF were obtained for laboratory studies. Closing pressure of 11 cm water. The patient tolerated the procedure well, and there were no apparent complications.  IMPRESSION: 1.  Technically successful lumbar puncture at L3-L4 level  2.  Opening pressure 13 cm water, closing pressure 11 cm water  3.  8 mL of clear CSF collected and sent for laboratory testing  4.  Patient tolerated procedure well with no apparent complications  Read by: Reatha Armour, PA-C  Electronically Signed   By: Darrin Nipper M.D.   On: 12/04/2021 11:01 Note: Reviewed        Physical  Exam  General appearance: Well nourished, well developed, and well hydrated. In no apparent acute distress Mental status: Alert, oriented x 3 (person, place, & time)       Respiratory: No evidence of acute respiratory distress Eyes: PERLA Vitals: BP 111/67 (BP Location: Right Arm, Patient Position: Sitting, Cuff Size: Normal)   Pulse 66   Temp (!) 97.4 F (36.3 C) (Temporal)   Resp 16   Ht 5' 7"  (1.702 m)   Wt 134 lb (60.8 kg)   LMP  (LMP Unknown)   SpO2 100%   BMI 20.99 kg/m  BMI: Estimated body mass index is 20.99 kg/m as calculated from the following:   Height as of this encounter: 5' 7"  (1.702 m).   Weight as of this encounter: 134 lb (60.8 kg). Ideal: Ideal body weight: 61.6 kg (135 lb 12.9 oz)  Assessment   Diagnosis Status  1. Postdural puncture headache   2. History of idiopathic intracranial hypertension    Controlled Controlled Controlled   Updated Problems: Problem  Postdural Puncture Headache   Status post diagnostic lumbar puncture (12/04/2021).      Plan of Care  Problem-specific:  No problem-specific Assessment & Plan notes found for this encounter.  Ms. KYLYN MCDADE has a current medication list which includes the following long-term medication(s): bupropion, lamotrigine, pantoprazole, sumatriptan, topiramate, vilazodone hcl, and zolpidem.  Pharmacotherapy (Medications Ordered): Meds ordered this encounter  Medications   lidocaine (XYLOCAINE) 2 % (with pres) injection 400 mg   pentafluoroprop-tetrafluoroeth (GEBAUERS) aerosol   lactated ringers infusion   sodium chloride flush (NS) 0.9 % injection 10 mL   Orders:  Orders Placed This Encounter  Procedures   EPIDURAL BLOOD PATCH    Scheduling Instructions:     Side: Midline     Level: L3-4     Sedation: No Sedation.     Timeframe: Today    Order Specific Question:   Where will this procedure be performed?    Answer:   [facility]   DG PAIN CL[facility] C-ARM 1-60 MIN NO REPORT     Intraoperative interpretation by procedural physician at [place].    Standing Status:   Standing    Number of Occurrences:   1    Order Specific Question:   Reason for exam:    Answer:   Assistance in needle guidance and placement for procedures requiring needle placement in or near specific anatomical locations not easily accessible without such assistance.   Provide equipment / supplies at bedside    "Epidural  Tray" (Disposable  single use) Catheter: NOT required    Standing Status:   Standing    Number of Occurrences:   1    Order Specific Question:   Specify    Answer:   Epidural Tray   Follow-up plan:   Return for scheduled encounter.     Interventional Therapies  Risk  Complexity Considerations:   Estimated body mass index is 21.14 kg/m as calculated from the following:   Height as of this encounter: 5' 7"  (1.702 m).   Weight as of this encounter: 135 lb (61.2 kg). WNL   Planned  Pending:   See above for possible orders   Under consideration:   Pending completion of evaluation   Completed:   None at this time   Completed by other providers:   None at this time   Therapeutic  Palliative (PRN) options:   None established    Recent Visits Date Type Provider Dept  11/30/21 Office Visit Milinda Pointer, MD [facility]  Showing recent visits within past 90 days and meeting all other requirements Today's Visits Date Type Provider Dept  12/08/21 Procedure visit Milinda Pointer, MD [facility]  Showing today's visits and meeting all other requirements Future Appointments Date Type Provider Dept  01/11/22 Appointment Milinda Pointer, MD [facility]  Showing future appointments within next 90 days and meeting all other requirements  I discussed the assessment and treatment plan with the patient. The patient was provided an opportunity to ask questions and all were answered. The patient agreed with the plan and  demonstrated an understanding of the instructions.  Patient advised to call back or seek an in-person evaluation if the symptoms or condition worsens.  Duration of encounter: 20 minutes.  Total time on encounter, as per AMA guidelines included both the face-to-face and non-face-to-face time personally spent by the physician and/or other qualified health care professional(s) on the day of the encounter (includes time in activities that require the physician or other qualified health care professional and does not include time in activities normally performed by clinical staff). Physician's time may include the following activities when performed: preparing to see the patient (eg, review of tests, pre-charting review of records) obtaining and/or reviewing separately obtained history performing a medically appropriate examination and/or evaluation counseling and educating the patient/family/caregiver ordering medications, tests, or procedures referring and communicating with other health care professionals (when not separately reported) documenting clinical information in the electronic or other health record independently interpreting results (not separately reported) and communicating results to the patient/ family/caregiver care coordination (not separately reported)  Note by: Gaspar Cola, MD Date: 12/08/2021; Time: 10:57 AM

## 2021-12-08 NOTE — Progress Notes (Signed)
Safety precautions to be maintained throughout the outpatient stay will include: orient to surroundings, keep bed in low position, maintain call bell within reach at all times, provide assistance with transfer out of bed and ambulation.  

## 2021-12-08 NOTE — Patient Instructions (Signed)
____________________________________________________________________________________________  General Risks and Possible Complications  Patient Responsibilities: It is important that you read this as it is part of your informed consent. It is our duty to inform you of the risks and possible complications associated with treatments offered to you. It is your responsibility as a patient to read this and to ask questions about anything that is not clear or that you believe was not covered in this document.  Patient's Rights: You have the right to refuse treatment. You also have the right to change your mind, even after initially having agreed to have the treatment done. However, under this last option, if you wait until the last second to change your mind, you may be charged for the materials used up to that point.  Introduction: Medicine is not an exact science. Everything in Medicine, including the lack of treatment(s), carries the potential for danger, harm, or loss (which is by definition: Risk). In Medicine, a complication is a secondary problem, condition, or disease that can aggravate an already existing one. All treatments carry the risk of possible complications. The fact that a side effects or complications occurs, does not imply that the treatment was conducted incorrectly. It must be clearly understood that these can happen even when everything is done following the highest safety standards.  No treatment: You can choose not to proceed with the proposed treatment alternative. The "PRO(s)" would include: avoiding the risk of complications associated with the therapy. The "CON(s)" would include: not getting any of the treatment benefits. These benefits fall under one of three categories: diagnostic; therapeutic; and/or palliative. Diagnostic benefits include: getting information which can ultimately lead to improvement of the disease or symptom(s). Therapeutic benefits are those associated with the  successful treatment of the disease. Finally, palliative benefits are those related to the decrease of the primary symptoms, without necessarily curing the condition (example: decreasing the pain from a flare-up of a chronic condition, such as incurable terminal cancer).  General Risks and Complications: These are associated to most interventional treatments. They can occur alone, or in combination. They fall under one of the following six (6) categories: no benefit or worsening of symptoms; bleeding; infection; nerve damage; allergic reactions; and/or death. No benefits or worsening of symptoms: In Medicine there are no guarantees, only probabilities. No healthcare provider can ever guarantee that a medical treatment will work, they can only state the probability that it may. Furthermore, there is always the possibility that the condition may worsen, either directly, or indirectly, as a consequence of the treatment. Bleeding: This is more common if the patient is taking a blood thinner, either prescription or over the counter (example: Goody Powders, Fish oil, Aspirin, Garlic, etc.), or if suffering a condition associated with impaired coagulation (example: Hemophilia, cirrhosis of the liver, low platelet counts, etc.). However, even if you do not have one on these, it can still happen. If you have any of these conditions, or take one of these drugs, make sure to notify your treating physician. Infection: This is more common in patients with a compromised immune system, either due to disease (example: diabetes, cancer, human immunodeficiency virus [HIV], etc.), or due to medications or treatments (example: therapies used to treat cancer and rheumatological diseases). However, even if you do not have one on these, it can still happen. If you have any of these conditions, or take one of these drugs, make sure to notify your treating physician. Nerve Damage: This is more common when the treatment is an invasive    one, but it can also happen with the use of medications, such as those used in the treatment of cancer. The damage can occur to small secondary nerves, or to large primary ones, such as those in the spinal cord and brain. This damage may be temporary or permanent and it may lead to impairments that can range from temporary numbness to permanent paralysis and/or brain death. Allergic Reactions: Any time a substance or material comes in contact with our body, there is the possibility of an allergic reaction. These can range from a mild skin rash (contact dermatitis) to a severe systemic reaction (anaphylactic reaction), which can result in death. Death: In general, any medical intervention can result in death, most of the time due to an unforeseen complication. ____________________________________________________________________________________________ ____________________________________________________________________________________________  Post-Procedure Discharge Instructions  Instructions: Apply ice:  Purpose: This will minimize any swelling and discomfort after procedure.  When: Day of procedure, as soon as you get home. How: Fill a plastic sandwich bag with crushed ice. Cover it with a small towel and apply to injection site. How long: (15 min on, 15 min off) Apply for 15 minutes then remove x 15 minutes.  Repeat sequence on day of procedure, until you go to bed. Apply heat:  Purpose: To treat any soreness and discomfort from the procedure. When: Starting the next day after the procedure. How: Apply heat to procedure site starting the day following the procedure. How long: May continue to repeat daily, until discomfort goes away. Food intake: Start with clear liquids (like water) and advance to regular food, as tolerated.  Physical activities: Keep activities to a minimum for the first 8 hours after the procedure. After that, then as tolerated. Driving: If you have received any sedation,  be responsible and do not drive. You are not allowed to drive for 24 hours after having sedation. Blood thinner: (Applies only to those taking blood thinners) You may restart your blood thinner 6 hours after your procedure. Insulin: (Applies only to Diabetic patients taking insulin) As soon as you can eat, you may resume your normal dosing schedule. Infection prevention: Keep procedure site clean and dry. Shower daily and clean area with soap and water. Post-procedure Pain Diary: Extremely important that this be done correctly and accurately. Recorded information will be used to determine the next step in treatment. For the purpose of accuracy, follow these rules: Evaluate only the area treated. Do not report or include pain from an untreated area. For the purpose of this evaluation, ignore all other areas of pain, except for the treated area. After your procedure, avoid taking a long nap and attempting to complete the pain diary after you wake up. Instead, set your alarm clock to go off every hour, on the hour, for the initial 8 hours after the procedure. Document the duration of the numbing medicine, and the relief you are getting from it. Do not go to sleep and attempt to complete it later. It will not be accurate. If you received sedation, it is likely that you were given a medication that may cause amnesia. Because of this, completing the diary at a later time may cause the information to be inaccurate. This information is needed to plan your care. Follow-up appointment: Keep your post-procedure follow-up evaluation appointment after the procedure (usually 2 weeks for most procedures, 6 weeks for radiofrequencies). DO NOT FORGET to bring you pain diary with you.   Expect: (What should I expect to see with my procedure?) From numbing medicine (AKA: Local  Anesthetics): Numbness or decrease in pain. You may also experience some weakness, which if present, could last for the duration of the local  anesthetic. Onset: Full effect within 15 minutes of injected. Duration: It will depend on the type of local anesthetic used. On the average, 1 to 8 hours.   From procedure: Some discomfort is to be expected once the numbing medicine wears off. This should be minimal if ice and heat are applied as instructed.  Call if: (When should I call?) You experience numbness and weakness that gets worse with time, as opposed to wearing off. New onset bowel or bladder incontinence. (Applies only to procedures done in the spine)  Emergency Numbers: Durning business hours (Monday - Thursday, 8:00 AM - 4:00 PM) (Friday, 9:00 AM - 12:00 Noon): [phone] After hours: [phone] NOTE: If you are having a problem and are unable connect with, or to talk to a provider, then go to your nearest urgent care or emergency department. If the problem is serious and urgent, please call 911. ____________________________________________________________________________________________

## 2021-12-08 NOTE — ED Triage Notes (Signed)
Pt here with a headache and back pain. Pt had a lumbar puncture on last Monday. Pt states she has been dealing with a severe headache and back pain since the puncture. Pt states pain is worse when she she is up and ambulating.

## 2021-12-09 ENCOUNTER — Ambulatory Visit
Admission: RE | Admit: 2021-12-09 | Discharge: 2021-12-09 | Disposition: A | Discharge status: Home / Self Care | Payer: Medicare Other | Source: Ambulatory Visit | Attending: Pain Medicine | Admitting: Pain Medicine

## 2021-12-09 ENCOUNTER — Telehealth: Payer: Self-pay

## 2021-12-09 DIAGNOSIS — G971 Other reaction to spinal and lumbar puncture: Secondary | ICD-10-CM | POA: Insufficient documentation

## 2021-12-09 DIAGNOSIS — R937 Abnormal findings on diagnostic imaging of other parts of musculoskeletal system: Secondary | ICD-10-CM | POA: Insufficient documentation

## 2021-12-09 DIAGNOSIS — G8929 Other chronic pain: Secondary | ICD-10-CM | POA: Insufficient documentation

## 2021-12-09 DIAGNOSIS — M79671 Pain in right foot: Secondary | ICD-10-CM | POA: Insufficient documentation

## 2021-12-09 DIAGNOSIS — M79604 Pain in right leg: Secondary | ICD-10-CM | POA: Insufficient documentation

## 2021-12-09 DIAGNOSIS — M79672 Pain in left foot: Secondary | ICD-10-CM | POA: Insufficient documentation

## 2021-12-09 DIAGNOSIS — M5441 Lumbago with sciatica, right side: Secondary | ICD-10-CM | POA: Insufficient documentation

## 2021-12-09 DIAGNOSIS — Z8669 Personal history of other diseases of the nervous system and sense organs: Secondary | ICD-10-CM | POA: Insufficient documentation

## 2021-12-09 LAB — OLIGOCLONAL BANDS, CSF + SERM

## 2021-12-09 NOTE — Telephone Encounter (Signed)
Post procedure phone call.  Patient states she ishaving some low back pain but her headache is gone.

## 2021-12-10 LAB — 25-HYDROXY VITAMIN D LCMS D2+D3
25-Hydroxy, Vitamin D-2: 5.1 ng/mL
25-Hydroxy, Vitamin D-3: 7.3 ng/mL
25-Hydroxy, Vitamin D: 12 ng/mL — ABNORMAL LOW

## 2021-12-10 LAB — SEDIMENTATION RATE: Sed Rate: 3 mm/hr (ref 0–32)

## 2021-12-10 LAB — SELENIUM SERUM: Selenium, S/P: 128 ug/L (ref 93–198)

## 2021-12-10 LAB — COMP. METABOLIC PANEL (12)
AST: 13 IU/L (ref 0–40)
Albumin/Globulin Ratio: 1.8 (ref 1.2–2.2)
Albumin: 4.4 g/dL (ref 3.9–4.9)
Alkaline Phosphatase: 34 IU/L — ABNORMAL LOW (ref 44–121)
BUN/Creatinine Ratio: 12 (ref 9–23)
BUN: 8 mg/dL (ref 6–24)
Bilirubin Total: 0.4 mg/dL (ref 0.0–1.2)
Calcium: 9.5 mg/dL (ref 8.7–10.2)
Chloride: 107 mmol/L — ABNORMAL HIGH (ref 96–106)
Creatinine, Ser: 0.68 mg/dL (ref 0.57–1.00)
Globulin, Total: 2.5 g/dL (ref 1.5–4.5)
Glucose: 82 mg/dL (ref 70–99)
Potassium: 4.3 mmol/L (ref 3.5–5.2)
Sodium: 141 mmol/L (ref 134–144)
Total Protein: 6.9 g/dL (ref 6.0–8.5)
eGFR: 111 mL/min/{1.73_m2} (ref >59)

## 2021-12-10 LAB — C-REACTIVE PROTEIN: CRP: <1 mg/L (ref 0–10)

## 2021-12-10 LAB — MAGNESIUM: Magnesium: 2.2 mg/dL (ref 1.6–2.3)

## 2021-12-10 LAB — PARATHYROID HORMONE, INTACT (NO CA): PTH: 41 pg/mL (ref 15–65)

## 2021-12-10 LAB — VITAMIN E
Vitamin E (Alpha Tocopherol): 8 mg/L (ref 7.0–25.1)
Vitamin E(Gamma Tocopherol): 1.3 mg/L (ref 0.5–5.5)

## 2021-12-10 LAB — VITAMIN B1: Thiamine: 138.7 nmol/L (ref 66.5–200.0)

## 2021-12-10 LAB — VITAMIN B12: Vitamin B-12: 223 pg/mL — ABNORMAL LOW (ref 232–1245)

## 2021-12-10 LAB — COPPER, SERUM: Copper: 143 ug/dL (ref 80–158)

## 2021-12-10 MED ORDER — LAMOTRIGINE 200 MG PO TABS: ORAL_TABLET | ORAL | 0 refills | Status: DC

## 2021-12-10 MED ORDER — ZOLPIDEM TARTRATE ER 12.5 MG PO TBCR: EXTENDED_RELEASE_TABLET | ORAL | 2 refills | Status: DC

## 2021-12-10 MED ORDER — BUPROPION HCL ER (XL) 150 MG PO TB24: 300.0000 mg | ORAL_TABLET | Freq: Every day | ORAL | 2 refills | Status: DC

## 2021-12-10 MED ORDER — VILAZODONE HCL 40 MG PO TABS: 40.0000 mg | ORAL_TABLET | Freq: Every day | ORAL | 0 refills | Status: DC

## 2021-12-10 NOTE — Telephone Encounter (Signed)
pt notifed that rxs' was sent to pharmacy .

## 2021-12-10 NOTE — Telephone Encounter (Signed)
I have sent bupropion, Lamictal, Viibryd and Ambien to pharmacy.

## 2021-12-11 LAB — VITAMIN A: Vitamin A: 41.4 ug/dL (ref 20.1–62.0)

## 2021-12-11 LAB — FOLATE: Folate: 8.3 ng/mL (ref >3.0)

## 2021-12-11 LAB — ZINC: Zinc: 80 ug/dL (ref 44–115)

## 2021-12-14 ENCOUNTER — Ambulatory Visit: Payer: Medicare Other | Admitting: Psychiatry

## 2021-12-16 ENCOUNTER — Encounter: Payer: Self-pay | Admitting: Pain Medicine

## 2021-12-16 DIAGNOSIS — E538 Deficiency of other specified B group vitamins: Secondary | ICD-10-CM | POA: Insufficient documentation

## 2021-12-17 ENCOUNTER — Telehealth: Payer: Self-pay

## 2021-12-17 NOTE — Telephone Encounter (Signed)
     Patient  visit on 10/24  at [place]   Have you been able to follow up with your primary care physician? YES  The patient was or was not able to obtain any needed medicine or equipment. YES  Are there diet recommendations that you are having difficulty following? NA  Patient expresses understanding of discharge instructions and education provided has no other needs at this time. [place], [facility], Care Management  [phone] [address], [place], [place] [address] Phone: [phone] Email: [email or web address]

## 2021-12-18 ENCOUNTER — Other Ambulatory Visit: Payer: Self-pay

## 2021-12-18 ENCOUNTER — Other Ambulatory Visit (INDEPENDENT_AMBULATORY_CARE_PROVIDER_SITE_OTHER): Payer: Medicare Other

## 2021-12-18 DIAGNOSIS — D509 Iron deficiency anemia, unspecified: Secondary | ICD-10-CM

## 2021-12-18 DIAGNOSIS — R634 Abnormal weight loss: Secondary | ICD-10-CM | POA: Diagnosis not present

## 2021-12-18 DIAGNOSIS — R1013 Epigastric pain: Secondary | ICD-10-CM | POA: Diagnosis not present

## 2021-12-18 DIAGNOSIS — G8929 Other chronic pain: Secondary | ICD-10-CM

## 2021-12-18 DIAGNOSIS — E559 Vitamin D deficiency, unspecified: Secondary | ICD-10-CM

## 2021-12-18 DIAGNOSIS — K219 Gastro-esophageal reflux disease without esophagitis: Secondary | ICD-10-CM | POA: Diagnosis not present

## 2021-12-18 DIAGNOSIS — R140 Abdominal distension (gaseous): Secondary | ICD-10-CM

## 2021-12-18 LAB — VITAMIN B12: Vitamin B-12: 293 pg/mL (ref 211–911)

## 2021-12-18 LAB — VITAMIN D 25 HYDROXY (VIT D DEFICIENCY, FRACTURES): VITD: 21.08 ng/mL — ABNORMAL LOW (ref 30.00–100.00)

## 2021-12-18 MED ORDER — ERGOCALCIFEROL 1.25 MG (50000 UT) PO CAPS: ORAL_CAPSULE | ORAL | 0 refills | Status: DC

## 2021-12-18 NOTE — Progress Notes (Signed)
Hi [name], please send her PO vitamin D 50,000 IU daily for 2 weeks, then go back to weekly dosing. We will recheck her vitamin D level in another 2 weeks.

## 2021-12-20 ENCOUNTER — Other Ambulatory Visit: Payer: Self-pay | Admitting: Psychiatry

## 2021-12-20 DIAGNOSIS — F410 Panic disorder [episodic paroxysmal anxiety] without agoraphobia: Secondary | ICD-10-CM

## 2021-12-21 ENCOUNTER — Other Ambulatory Visit: Payer: Self-pay

## 2021-12-21 ENCOUNTER — Emergency Department
Admission: EM | Admit: 2021-12-21 | Discharge: 2021-12-21 | Disposition: A | Discharge status: Home / Self Care | Payer: Medicare Other | Attending: Emergency Medicine | Admitting: Emergency Medicine

## 2021-12-21 ENCOUNTER — Encounter: Payer: Self-pay | Admitting: Pain Medicine

## 2021-12-21 DIAGNOSIS — Z8616 Personal history of COVID-19: Secondary | ICD-10-CM | POA: Insufficient documentation

## 2021-12-21 DIAGNOSIS — J45909 Unspecified asthma, uncomplicated: Secondary | ICD-10-CM | POA: Diagnosis not present

## 2021-12-21 DIAGNOSIS — R519 Headache, unspecified: Secondary | ICD-10-CM | POA: Diagnosis not present

## 2021-12-21 DIAGNOSIS — Z20822 Contact with and (suspected) exposure to covid-19: Secondary | ICD-10-CM | POA: Insufficient documentation

## 2021-12-21 DIAGNOSIS — R42 Dizziness and giddiness: Secondary | ICD-10-CM | POA: Diagnosis present

## 2021-12-21 DIAGNOSIS — E1140 Type 2 diabetes mellitus with diabetic neuropathy, unspecified: Secondary | ICD-10-CM | POA: Insufficient documentation

## 2021-12-21 LAB — COMPREHENSIVE METABOLIC PANEL
ALT: 22 U/L (ref 0–44)
AST: 15 U/L (ref 15–41)
Albumin: 3.6 g/dL (ref 3.5–5.0)
Alkaline Phosphatase: 25 U/L — ABNORMAL LOW (ref 38–126)
Anion gap: 6 (ref 5–15)
BUN: 11 mg/dL (ref 6–20)
CO2: 21 mmol/L — ABNORMAL LOW (ref 22–32)
Calcium: 9 mg/dL (ref 8.9–10.3)
Chloride: 110 mmol/L (ref 98–111)
Creatinine, Ser: 0.72 mg/dL (ref 0.44–1.00)
GFR, Estimated: >60 mL/min (ref >60)
Glucose, Bld: 94 mg/dL (ref 70–99)
Potassium: 5 mmol/L (ref 3.5–5.1)
Sodium: 137 mmol/L (ref 135–145)
Total Bilirubin: 0.8 mg/dL (ref 0.3–1.2)
Total Protein: 6.8 g/dL (ref 6.5–8.1)

## 2021-12-21 LAB — CBC WITH DIFFERENTIAL/PLATELET
Abs Immature Granulocytes: 0.03 10*3/uL (ref 0.00–0.07)
Basophils Absolute: 0.1 10*3/uL (ref 0.0–0.1)
Basophils Relative: 1 %
Eosinophils Absolute: 0.1 10*3/uL (ref 0.0–0.5)
Eosinophils Relative: 1 %
HCT: 40.9 % (ref 36.0–46.0)
Hemoglobin: 13.1 g/dL (ref 12.0–15.0)
Immature Granulocytes: 0 %
Lymphocytes Relative: 24 %
Lymphs Abs: 1.6 10*3/uL (ref 0.7–4.0)
MCH: 29.1 pg (ref 26.0–34.0)
MCHC: 32 g/dL (ref 30.0–36.0)
MCV: 90.9 fL (ref 80.0–100.0)
Monocytes Absolute: 0.5 10*3/uL (ref 0.1–1.0)
Monocytes Relative: 7 %
Neutro Abs: 4.5 10*3/uL (ref 1.7–7.7)
Neutrophils Relative %: 67 %
Platelets: 341 10*3/uL (ref 150–400)
RBC: 4.5 MIL/uL (ref 3.87–5.11)
RDW: 13.2 % (ref 11.5–15.5)
WBC: 6.8 10*3/uL (ref 4.0–10.5)
nRBC: 0 % (ref 0.0–0.2)

## 2021-12-21 LAB — URINALYSIS, ROUTINE W REFLEX MICROSCOPIC
Bilirubin Urine: NEGATIVE
Glucose, UA: NEGATIVE mg/dL
Hgb urine dipstick: NEGATIVE
Ketones, ur: NEGATIVE mg/dL
Leukocytes,Ua: NEGATIVE
Nitrite: NEGATIVE
Protein, ur: NEGATIVE mg/dL
Specific Gravity, Urine: 1.021 (ref 1.005–1.030)
pH: 6 (ref 5.0–8.0)

## 2021-12-21 LAB — SARS CORONAVIRUS 2 BY RT PCR: SARS Coronavirus 2 by RT PCR: NEGATIVE

## 2021-12-21 MED ORDER — ONDANSETRON 4 MG PO TBDP
4.0000 mg | ORAL_TABLET | Freq: Once | ORAL | Status: AC
  Administered 2021-12-21: 4 mg via ORAL
  Filled 2021-12-21: qty 1

## 2021-12-21 NOTE — ED Provider Notes (Signed)
[facility] Provider Note    Event Date/Time   First MD Initiated Contact with Patient 12/21/21 0830     (approximate)   History   Dizziness and Hypertension   HPI  [name] is a 46 y.o. female past medical history of chronic pain, cardiomyopathy, anxiety depression who presents with elevated blood pressure and dizziness.  Patient had a lumbar puncture done as an outpatient for evaluation for IIH.  Afterward she developed significant headache was seen in the ED on 10/24 referred back to pain clinic and had a blood patch placed.  Her severe postdural puncture headache has improved but since 10/27 she tells me she just feeling off.  She feels a pressure like pulsation throughout her whole body.  On 10/27 she had brief period where she felt like she could not swallow and was short of breath this is now resolved.  She does endorse some blurred vision no double vision no new numbness or tingling but does feel weak in bilateral legs.  When she stands up she feels dizzy which she denies as feeling like a spinning sensation or like she is going to pass out just feels off balance.  This improves after she is standing up for some time.  Denies urinary symptoms shortness of breath chest pain fevers chills cough congestion.  She attributes her symptoms to her recent blood patch.     Past Medical History:  Diagnosis Date   Abnormal ECG    Anemia    Anxiety    Arthritis    Asthma    B12 deficiency    Cardiomyopathy ([place])    Chronic abdominal pain    Complication of anesthesia    difficulty to get sedated during endoscopy   COVID-19 01/18/2019   DDD (degenerative disc disease), lumbar    Depression    Diabetes mellitus without complication ([place])    ONLY gestational diabetes, does not have chronic diabetes   Dysrhythmia    GERD (gastroesophageal reflux disease)    Iron deficiency    Migraine headache    Neuropathy    Obesity    gastric bypass 2009    Ovarian cyst    Pneumonia    within past five years   PTSD (post-traumatic stress disorder)    PTSD (post-traumatic stress disorder)    Rh negative status during pregnancy    Seizures ([place])    post-gestational   Small bowel obstruction (H[facility])     Patient Active Problem List   Diagnosis Date Noted   Vitamin B12 deficiency 12/16/2021   Postdural puncture headache 12/08/2021   History of idiopathic intracranial hypertension 11/30/2021   Abnormal MRI, lumbar spine (01/28/2017) 11/30/2021   History of bariatric surgery 11/30/2021   Idiopathic small fiber peripheral neuropathy (1ry area of Pain) (feet> hands) 11/30/2021   Chronic feet pain (Bilateral) 11/30/2021   Chronic hand pain (Bilateral) 11/30/2021   Chronic lower extremity pain (3ry area of Pain) (Right) (intermittent) 11/30/2021   Chronic neck pain (4th area of Pain) (Midline) (Left) 11/30/2021   Cervicalgia 11/30/2021   Chronic shoulder pain (5th area of Pain) (Left) 71/24/5809   Complications of bariatric procedures 11/30/2021   Postoperative intestinal malabsorption 11/30/2021   Chronic pain syndrome 11/28/2021   Pharmacologic therapy 11/28/2021   Disorder of skeletal system 11/28/2021   Problems influencing health status 11/28/2021   Adjustment disorder 10/07/2021   Cervical radiculopathy 08/05/2021   MDD (major depressive disorder), recurrent, in partial remission ([place]) 06/09/2020   Primary  insomnia 04/03/2020   MDD (major depressive disorder), recurrent, in full remission ([place]) 08/13/2019   Insomnia due to medical condition 05/23/2019   MDD (major depressive disorder), recurrent, severe, 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   MDD (major depressive disorder), recurrent episode, moderate ([place]) 09/15/2018   Panic disorder 09/15/2018   PTSD (post-traumatic stress disorder) 09/15/2018   Insomnia due to mental condition 09/15/2018   Panniculitis 08/01/2018   Neuropathy 07/26/2018   Iron deficiency anemia 11/12/2015   Uterine  fibroid 09/30/2015   Abnormal uterine bleeding 09/29/2015   Abdominal pain, generalized 03/27/2015   Degeneration of intervertebral disc of lumbar region 12/18/2014   Abnormal ECG 10/09/2014   Achilles tendinitis 10/09/2014   Anxiety 10/09/2014   DDD (degenerative disc disease), lumbar 10/09/2014   Clinical depression 10/09/2014   Class 1 obesity 10/09/2014   Family planning 06/06/2014   Fibroid 06/06/2014   Bariatric surgery status 06/06/2014   Vitamin D deficiency 04/22/2014   Achilles bursitis 04/12/2014   DDD (degenerative disc disease), lumbosacral 04/12/2014   Chronic low back pain (2ry area of Pain) (Right) w/ sciatica (Right) 04/12/2014   Abdominal pain, right upper quadrant 04/12/2014   Degeneration of intervertebral disc of lumbosacral region 04/12/2014   Diabetes mellitus arising in pregnancy 04/01/2014   Personal history of surgery to heart and great vessels, presenting hazards to health 04/01/2014   Other specified postprocedural states 04/01/2014   Episode of syncope 02/04/2014   Breathlessness on exertion 02/04/2014   Awareness of heartbeats 12/11/2013   Cardiomyopathy ([place]) 10/29/2013   Dizziness 10/29/2013   Mixed incontinence 08/31/2012   Adiposity 08/31/2012   Chronic pain associated with significant psychosocial dysfunction 05/12/2012   Disorder of sacrum 05/12/2012   Arthropathy of lumbar facet joint 05/12/2012   Lumbar and sacral osteoarthritis 05/12/2012   Arthralgia, sacroiliac 05/12/2012     Physical Exam  Triage Vital Signs: ED Triage Vitals  Enc Vitals Group     BP      Pulse      Resp      Temp      Temp src      SpO2      Weight      Height      Head Circumference      Peak Flow      Pain Score      Pain Loc      Pain Edu?      Excl. in [place]?     Most recent vital signs: Vitals:   12/21/21 0833  BP: 129/87  Pulse: 90  Resp: 18  Temp: 98.4 F (36.9 C)  SpO2: 100%     General: Awake, no distress.  CV:  Good peripheral  perfusion.  Resp:  Normal effort.  Abd:  No distention.  Neuro:             Awake, Alert, Oriented x 3  Other:  Aox3, nml speech  PERRL, EOMI, face symmetric, nml tongue movement  5/5 strength in the BL upper and lower extremities  Sensation grossly intact in the BL upper and lower extremities  Finger-nose-finger intact BL    ED Results / Procedures / Treatments  Labs (all labs ordered are listed, but only abnormal results are displayed) Labs Reviewed  COMPREHENSIVE METABOLIC PANEL - Abnormal; Notable for the following components:      Result Value   CO2 21 (*)    Alkaline Phosphatase 25 (*)    All other components within normal limits  URINALYSIS, ROUTINE W REFLEX  MICROSCOPIC - Abnormal; Notable for the following components:   Color, Urine YELLOW (*)    APPearance HAZY (*)    All other components within normal limits  SARS CORONAVIRUS 2 BY RT PCR  CBC WITH DIFFERENTIAL/PLATELET  POC URINE PREG, ED  CBG MONITORING, ED  POC URINE PREG, ED     EKG  EKG interpretation performed by myself: NSR, nml axis, short PR interval, no delta wave, no acute ischemic changes    RADIOLOGY   PROCEDURES:  Critical Care performed: No  Procedures   MEDICATIONS ORDERED IN ED: Medications  ondansetron (ZOFRAN-ODT) disintegrating tablet 4 mg (4 mg Oral Given 12/21/21 0851)     IMPRESSION / MDM / ASSESSMENT AND PLAN / ED COURSE  I reviewed the triage vital signs and the nursing notes.                              Patient's presentation is most consistent with acute, uncomplicated illness.  Differential diagnosis includes, but is not limited to, viral illness, electrolyte abnormality, AKI, hypovolemia, orthostatic hypotension, anemia  Patient is a 46 year old female who presents because of generalized symptoms.  She underwent recent lumbar puncture had significant posterior puncture headache and then had a blood patch placed on 10/24.  Since 11/2015 describes just feeling off  describes pulsation throughout her entire body and pressure-like sensation as well as feeling somewhat off balance when she stands up.  She denies any focal neurologic symptoms but does feel weak in bilateral legs.  No other infectious or ischemic symptoms.  Patient was concerned because blood pressure was significantly elevated at home 230s over 160s.  However in the ED is 129/87 I suspect that these were spurious readings.  She looks well neurologic exam is intact.  Plan to check basic labs to rule out anemia electrolyte abnormality etc.  Will give Zofran as patient having some nausea.  Patient's labs are reassuring.  This is negative.  Bicarb mildly low at 21.  Patient's blood pressure has been normal throughout ED stay.  Patient is questioning whether her symptoms could be due to rebound or cranial hypertension, which is a complication of an epidural blood patch.  Reviewed the literature and while it is not currently reported is a potential complication of epidural blood patching.  Typically presents with severe headache nausea and vomiting, and headache that is worse with being recumbent.  Patient had initially not stressed the headache and more complaint and full body throbbing.  On reassessment she is complaining of headache but it is not severe.  She is not having any visual changes currently.  Looks like typically the symptoms will start within 24 to 48 hours of blood patching as well and her symptoms started after that time.  This would be repeat lumbar puncture for CSF drainage which patient is very much not interested and I agree do not feel that this would be the best course of action at this time, as I am not convinced that this is a diagnosis and patient does not have severe pain at this time.  Recommend she follow-up with her pain specialist who performed epidural blood patch as well as her neurologist Dr. Manuella Ghazi.       FINAL CLINICAL IMPRESSION(S) / ED DIAGNOSES   Final diagnoses:   Dizziness  Nonintractable headache, unspecified chronicity pattern, unspecified headache type     Rx / DC Orders   ED Discharge Orders  None        Note:  This document was prepared using Dragon voice recognition software and may include unintentional dictation errors.   Rada Hay, MD 12/21/21 1005

## 2021-12-21 NOTE — ED Triage Notes (Signed)
Pt comes in with complaints of dizziness, nausea, hypertension, and mild pounding headache. Pt states that she has not feeling well since she had a blood patch for migraine/lumbar puncture testing/24/2023.  Pt is alert and oriented with no signs of acute distress at this time.

## 2021-12-21 NOTE — Discharge Instructions (Signed)
Please follow-up with your neurologist and your pain specialist regarding your headache.  You can continue to take Tylenol Motrin for pain.  If any of your symptoms are worsening you develop any new numbness tingling weakness or persistent double vision please return to the emergency department.

## 2021-12-28 ENCOUNTER — Other Ambulatory Visit: Payer: Medicare Other

## 2021-12-28 DIAGNOSIS — K219 Gastro-esophageal reflux disease without esophagitis: Secondary | ICD-10-CM

## 2021-12-28 DIAGNOSIS — G8929 Other chronic pain: Secondary | ICD-10-CM

## 2021-12-28 DIAGNOSIS — R634 Abnormal weight loss: Secondary | ICD-10-CM

## 2021-12-28 DIAGNOSIS — D509 Iron deficiency anemia, unspecified: Secondary | ICD-10-CM

## 2021-12-29 ENCOUNTER — Ambulatory Visit
Admission: RE | Admit: 2021-12-29 | Discharge: 2021-12-29 | Disposition: A | Discharge status: Home / Self Care | Payer: Medicare Other | Source: Ambulatory Visit | Attending: Family Medicine | Admitting: Family Medicine

## 2021-12-29 ENCOUNTER — Other Ambulatory Visit: Payer: Self-pay | Admitting: Internal Medicine

## 2021-12-29 DIAGNOSIS — Z1231 Encounter for screening mammogram for malignant neoplasm of breast: Secondary | ICD-10-CM | POA: Diagnosis not present

## 2021-12-29 DIAGNOSIS — M791 Myalgia, unspecified site: Secondary | ICD-10-CM | POA: Diagnosis present

## 2021-12-29 DIAGNOSIS — E559 Vitamin D deficiency, unspecified: Secondary | ICD-10-CM

## 2021-12-29 DIAGNOSIS — Z13820 Encounter for screening for osteoporosis: Secondary | ICD-10-CM | POA: Diagnosis not present

## 2021-12-29 DIAGNOSIS — Z9884 Bariatric surgery status: Secondary | ICD-10-CM

## 2021-12-29 DIAGNOSIS — M8589 Other specified disorders of bone density and structure, multiple sites: Secondary | ICD-10-CM | POA: Diagnosis not present

## 2021-12-29 DIAGNOSIS — Z780 Asymptomatic menopausal state: Secondary | ICD-10-CM | POA: Diagnosis not present

## 2022-01-04 LAB — OVA AND PARASITE EXAMINATION
CONCENTRATE RESULT:: NONE SEEN
MICRO NUMBER:: 14180257
SPECIMEN QUALITY:: ADEQUATE
TRICHROME RESULT:: NONE SEEN

## 2022-01-04 LAB — PANCREATIC ELASTASE, FECAL: Pancreatic Elastase-1, Stool: >500 mcg/g

## 2022-01-09 NOTE — Progress Notes (Signed)
PROVIDER NOTE: Information contained herein reflects review and annotations entered in association with encounter. Interpretation of such information and data should be left to medically-trained personnel. Information provided to patient can be located elsewhere in the medical record under "Patient Instructions". Document created using STT-dictation technology, any transcriptional errors that may result from process are unintentional.    Patient: [name]  Service Category: E/M  Provider: Gaspar Cola, MD  DOB: 05/25/1975  DOS: 01/11/2022  Referring Provider: Leonard Downing, *  MRN: [id number]  Specialty: Interventional Pain Management  PCP: Leonard Downing, MD  Type: Established Patient  Setting: Ambulatory outpatient    Location: Office  Delivery: Face-to-face     Primary Reason(s) for Visit: Encounter for evaluation before starting new chronic pain management plan of care (Level of risk: moderate) CC: Headache (New pain after her procedure)  HPI  [name] is a 46 y.o. year old, female patient, who comes today for a follow-up evaluation to review the test results and decide on a treatment plan. She has Abnormal ECG; Achilles bursitis; Achilles tendinitis; Anxiety; Family planning; Cardiomyopathy [facility]); Chronic pain associated with significant psychosocial dysfunction; DDD (degenerative disc disease), lumbar; Clinical depression; Disorder of sacrum; DDD (degenerative disc disease), lumbosacral; Fibroid; Dizziness; Lumbar facet arthropathy; Diabetes mellitus arising in pregnancy; Bariatric surgery status; Personal history of surgery to heart and great vessels, presenting hazards to health; Chronic low back pain (2ry area of Pain) (Right) w/ sciatica (Right); Lumbar and sacral osteoarthritis; Mixed incontinence; Abdominal pain, right upper quadrant; Awareness of heartbeats; Adiposity; Class 1 obesity; Vitamin D deficiency; Episode of syncope; Breathlessness on exertion;  Arthralgia, sacroiliac; Other specified postprocedural states; Abdominal pain, generalized; Abnormal uterine bleeding; Uterine fibroid; Iron deficiency anemia; Neuropathy; Panniculitis; MDD (major depressive disorder), recurrent episode, moderate ([place]); Panic disorder; PTSD (post-traumatic stress disorder); Insomnia due to mental condition; MDD (major depressive disorder), recurrent, severe, with psychosis ([place]); Insomnia due to medical condition; MDD (major depressive disorder), recurrent, in full remission ([place]); Primary insomnia; MDD (major depressive disorder), recurrent, in partial remission ([place]); Cervical radiculitis; Adjustment disorder; Chronic pain syndrome; Pharmacologic therapy; Disorder of skeletal system; Problems influencing health status; History of idiopathic intracranial hypertension; Abnormal MRI, lumbar spine (01/28/2017 & 12/11/2021); History of bariatric surgery; Idiopathic small fiber peripheral neuropathy (1ry area of Pain) (feet> hands); Chronic feet pain (Bilateral); Chronic hand pain (Bilateral); Chronic lower extremity pain (3ry area of Pain) (Right) (intermittent); Chronic neck pain (4th area of Pain) (Midline) (Left); Cervicalgia; Chronic shoulder pain (5th area of Pain) (Left); Complications of bariatric procedures; Postoperative intestinal malabsorption; Vitamin B12 deficiency; Abnormal MRI, cervical spine (09/29/2021) ([facility]); Cervicogenic headache (Bilateral); and Occipital neuralgia (Bilateral) on their problem list. Her primarily concern today is the Headache (New pain after her procedure)  Pain Assessment: Location: Right, Left, Lower Head (pain in her ears) Radiating: pain radiaities around her head Onset: More than a month ago Duration: Acute pain Quality: Aching, Constant, Discomfort, Pounding Severity: 1 /10 (subjective, self-reported pain score)  Effect on ADL: limits my daily activities Timing: Constant Modifying factors: sat up in in recliner, BP: 115/66   HR: 82  [name] comes in today for a follow-up visit after her initial evaluation on 12/09/2021. Today we went over the results of her tests. These were explained in "Layman's terms". During today's appointment we went over my diagnostic impression, as well as the proposed treatment plan.  Review of initial evaluation (11/30/2021): "According to the patient the primary area of pain is that of her hands and  feet, secondary to a peripheral neuropathy.  She indicates that the feet typically hurt more than the hands.  She has both pain and numbness (tingling sensation).  She describes having had a nerve conduction test (NCT) as well as a punch biopsy at the [facility] neurology/pain center.  She describes as having being diagnosed with "small fiber disease".  She believes that this is secondary to malabsorption status post gastric bypass for bariatric surgery.  She describes over the years constantly taking vitamin D and vitamin B12 through injections and pills, but it does not seem to improve.  She denies having diabetes except for an episode of gestational diabetes.  She does have a prior history of having had bariatric surgery.  The patient's secondary area pain is that of the lower back (Right).  She denies any prior surgeries but she does admit to having had physical therapy several times over the years with the last time having been more than 10 years ago.  She refers having done physical therapy for approximately 6 months.  The patient describes having had multiple nerve blocks some of which were done at [facility] by Dr. Laroy Apple (PMR) for her low back pain.  She describes having these injections on a regular basis.  The patient also describes having also had injections done in [place] by "Dr. Shona Simpson" that since has moved away and for this reason she had to continue getting her injections at [facility].  She denies having had any recent x-rays of the lower back.  The patient's third area  pain is that of the right lower extremity.  She describes this pain to be intermittent and going all the way down to the top of her foot and the big toe and what appears to be an L5 dermatomal distribution.  She describes this pain to be an electrical-like shooting pain.  She denies any back surgeries, recent x-rays, but she does admit to having had that nerve conduction test at [facility] neurology.  She denies having had any nerve blocks or joint injections in the leg.  The patient's third area pain is that of the neck (posterior aspect) (Bilateral) (L>R).  She refers the pain to radiate towards her left shoulder.  She does have a history of "migraine headaches" which she describes as being bilateral and behind her eyes.  She describes that this is being treated by Dr. Manuella [facility] neurology) she denies any pain in the occipital region or any type of pain or numbness in the distribution of the greater or lesser occipital nerves.  She describes that typically she has this pain treated with PRN Imitrex and monthly injections done by Dr. Manuella Ghazi, using Aimovig 140 mg/ml (preventative migraine treatment) (Aimovig selectively targets and blocks the calcitonin gene-related peptide receptor (CGRP-?R), disrupting a key component of migraine pathophysiology).  The patient denies any cervical surgeries, recent x-rays, physical therapy.  She indicates having had injections in the neck that were apparently done by Dr. Wyn Quaker (Board certified in anesthesiology and pain medicine) at [facility]).  The patient's fifth area pain is that of the left shoulder.  She describes that this pain seems to be associated to that of the neck.  The patient describes having had some shoulder injections done by Dr. Lamount Cranker at [facility].  She denies having had shoulder surgery.  She also denies any recent x-rays of the lower shoulder.  The patient's sixth area of concern is that of generalized  myalgias which she attributes  to Fibromyalgia.  The patient indicates having been diagnosed by her PCP Dr. Arelia Sneddon.  Pharmacotherapy: The patient indicates taking that Imitrex in the [place] for the migraine headaches.  She also describes taking Norco 7.5/325 every 4 to 6 hours.  She refers that the hydrocodone causes her to have some severe constipation and therefore she sometimes has to stop it for a week at a time.  She describes always having leftovers from her prescriptions.  She states that this is being prescribed by the [facility] pain clinic.  In addition she takes topiramate for her "neuropathy" as well as Lyrica.  She indicates that the Lyrica makes her sleepy all the time and she used to also take gabapentin in the past but it created a lot of problems with "brain fog" and she had to stop it Doximity 3 to 6 months ago.  Since she did, she has noticed that the brain fog went away but the pain also got worse.  She denies taking any muscle relaxants or NSAIDs.  In particular she refers not being able to take the NSAIDs secondary to her gastric bypass and the fact that over the years she has had several GI bleeds."  "The patient was added to the schedule on 12/08/2021 after having had a diagnostic lumbar puncture apparently done by radiology Neysa Bonito, James Ivanoff, [place]) on 12/04/2021.  The patient returned to ED on 12/08/2021 with a complaint of possible lumbar post-dural puncture headache.  The patient apparently had been sent to radiology for the diagnostic lumbar puncture by Dr. Jennings Books [facility] neurology) due to a diagnosis of idiopathic intracranial hypertension with headache.  According to a brief note by interventional radiology, the lumbar puncture was done at the L3-4 level with an opening pressure of 13 cm of water (Normal: 6-25 cm H2O) and closing pressure of 11 cm of water (.  8 mL of clear CSF was sent to the lab for analysis.  Glucose level was within normal limits (N: >60% of serum), protein levels were  within normal limits (N:<45 mg/dL).  CSF cell count shows an elevated red blood cell count which could have been secondary to procedural trauma."  The patient indicates having attained an ongoing 100% relief of the postdural puncture headache.  However, she still having the original headache which she describes as different.  She describes the pain to be located in the occipital region and to be referred over the top of the years getting also a sensation of fullness in the ears.  This seems to follow the distribution of the lesser occipital nerve, bilaterally.  The patient also indicates to be having neck pain as well.  The patient indicates that this discomfort started after her postdural puncture headache.  But the headache was completely different since in the case of the postdural puncture headache she would get the pain when she stood up and now she gets it when she lays flat.  Today she indicated having gone to [facility] where Wyn Quaker, MD gave her a cervical epidural steroid injection at the C7-T1 level on 10/16/2021 for a diagnosis of cervical radiculitis.  At the time she was also experiencing pain in the left shoulder but the cervical epidural steroid injection did not help that.  Subsequently she had a left shoulder injection done by Dr. Arbie [facility]) which did seem to help her shoulder pain.  At this point the plan is to provide the patient with a steroid taper and reassess her pain in 2 weeks.  If she continues to have pain in that area, then we will plan on doing a bilateral occipital nerve block over the area of the lesser occipital nerve.  Post-procedure evaluation   Procedure: Epidural Blood Patch x1 DOS: 12/08/2021    Effectiveness:  Initial hour after procedure: 100 %. Subsequent 4-6 hours post-procedure: 100 %. Analgesia past initial 6 hours: 100 %. Ongoing improvement:  Analgesic: The indicates having 100% ongoing relief of the posterior puncture  headache. Function: [name] reports improvement in function ROM: [name] reports improvement in ROM  Review of ordered tests: The patient was found to have a vitamin D deficiency.  She was also found to have a decreased alkaline phosphatase level. Test: Vitamin D levels Finding(s): Low  Normal Level(s): between 30 and 100 ng/mL. Vitamin D Insufficiency: Levels between 20-30 ng/ml are defined as a "Vitamin D insufficiency". Vitamin D Deficiency: Levels below 20 ng/ml, is diagnosed as a "Vitamin D Deficiency". Clinical significance:  Low 25-hydroxyvitamin D: A low blood level of 25-hydroxyvitamin D may mean that a person is not getting enough exposure to sunlight or enough dietary vitamin D to meet his or her body's demand or that there is a problem with its absorption from the intestines. Occasionally, drugs used to treat seizures, particularly phenytoin (Dilantin), can interfere with the production of 25-hydroxyvitamin D in the liver. There is some evidence that vitamin D deficiency may increase the risk of some cancers, immune diseases, and cardiovascular disease. Low 1,25-dihydroxyvitamin D: A low level of 1,25-dihydroxyvitamin D can be seen in kidney disease and is one of the earliest changes to occur in persons with early kidney failure. Signs and symptoms may include: Vitamin D deficiencies and insufficiencies may be associated with fatigue, weakness, bone pain, joint pain, and muscle pain. Associated complications may include: hypocalcemia, hypophosphatemia, and reduced bone density. Possible causes:  - Most common: dietary insufficiency; inadequate sun exposure; inability to absorb vitamin D from the intestines; or inability to process it due to kidney or liver disease.  Patient Recommendation(s): Patient may benefit from taking over-the-counter Vitamin D3 supplements. I recommend a vitamin D + Calcium supplements. "Natures Bounty", a brand easily found in most pharmacies, has a  formulation containing Calcium 1200 mg plus Vitamin D3 1000 IU, in Softgels capsules that are easy to swallow. This should be taken once a day, preferably in the morning as vitamin D will increase energy levels and make it difficult to fall asleep, if taken at night. Patients with levels lower than 20 ng/ml should contact their primary care physicians to receive replacement therapy. Vitamin D3 can be obtained over-the-counter, without a prescription. Vitamin D2 requires a prescription and it is used for replacement therapy.  Low levels of ALP may be seen temporarily after blood transfusions or heart bypass surgery. A deficiency in zinc may cause decreased levels. A rare genetic disorder of bone metabolism called hypophosphatasia can cause severe, protracted low levels of ALP. Malnutrition or protein deficiency as well as Wilson disease could also be possible causes for lowered ALP.  (12/11/2021) LUMBAR MRI FINDINGS: Alignment: Straightening of the normal lumbar lordosis. Mild levocurvature. Paraspinal and other soft tissues: Subcentimeter left renal cysts.  DISC LEVELS: L4-5: Disc desiccation and minimal disc bulge with central annular fissure. Mild facet arthropathy. L5-S1: Disc height loss and mild disc bulge. Mild facet arthropathy.  IMPRESSION: 1. L4-5 central annular fissure can irritate adjacent nerve roots.   [name] was informed that I am currently unable to take patients for medication management.  If interested, she will be offered a pharmacotherapy evaluation, including recommendations. Treatment plan offered is in alignment with my interventional pain management specialty.   Controlled Substance Pharmacotherapy Assessment REMS (Risk Evaluation and Mitigation Strategy)  Opioid Analgesic: No chronic opioid analgesics therapy prescribed by our practice. Hydrocodone/APAP 7.5/325, 1 tab p.o. 4 times daily (# 120) prescribed by Ursula Alert, MD (last filled on 12/12/2021) MME/day: 30  mg/day  Pill Count: None expected due to no prior prescriptions written by our practice. Chauncey Fischer, RN  01/11/2022  8:59 AM  Sign when Signing Visit Safety precautions to be maintained throughout the outpatient stay will include: orient to surroundings, keep bed in low position, maintain call bell within reach at all times, provide assistance with transfer out of bed and ambulation.    Pharmacokinetics: Liberation and absorption (onset of action): WNL Distribution (time to peak effect): WNL Metabolism and excretion (duration of action): WNL         Pharmacodynamics: Desired effects: Analgesia: [name] reports >50% benefit. Functional ability: Patient reports that medication allows her to accomplish basic ADLs Clinically meaningful improvement in function (CMIF): Sustained CMIF goals met Perceived effectiveness: Described as relatively effective, allowing for increase in activities of daily living (ADL) Undesirable effects: Side-effects or Adverse reactions: None reported Monitoring: [place] PMP: PDMP reviewed during this encounter. Online review of the past 3-monthperiod previously conducted. Not applicable at this point since we have not taken over the patient's medication management yet. List of other Serum/Urine Drug Screening Test(s):  Lab Results  Component Value Date   COCAINSCRNUR NEGATIVE 07/20/2013   THCU NEGATIVE 07/20/2013   List of all UDS test(s) done:  Lab Results  Component Value Date   SUMMARY Note 11/30/2021   Last UDS on record: Summary  Date Value Ref Range Status  11/30/2021 Note  Final    Comment:    ==================================================================== Compliance Drug Analysis, Ur ==================================================================== Test                             Result       Flag       Units  Drug Present   Topiramate                     PRESENT   Lamotrigine                    PRESENT   Zolpidem                        PRESENT   Zolpidem Acid                  PRESENT    Zolpidem acid is an expected metabolite of zolpidem.    Bupropion                      PRESENT   Hydroxybupropion               PRESENT    Hydroxybupropion is an expected metabolite of bupropion.    Vilazodone                     PRESENT   Acetaminophen                  PRESENT ==================================================================== Test  Result    Flag   Units      Ref Range   Creatinine              36               mg/dL      >=20 ==================================================================== Declared Medications:  Medication list was not provided. ==================================================================== For clinical consultation, please call [phone]. ====================================================================    UDS interpretation: No unexpected findings.          Medication Assessment Form: Not applicable. No opioids. Treatment compliance: Not applicable Risk Assessment Profile: Aberrant behavior: See initial evaluations. None observed or detected today Comorbid factors increasing risk of overdose: See initial evaluation. No additional risks detected today Opioid risk tool (ORT):     11/30/2021    9:17 AM  Opioid Risk   Alcohol 0  Illegal Drugs 0  Rx Drugs 0  Psychological Disease 2  ADD Negative  OCD Negative  Bipolar Negative  Depression 1  Opioid Risk Tool Scoring 3  Opioid Risk Interpretation Low Risk    ORT Scoring interpretation table:  Score <3 = Low Risk for SUD  Score between 4-7 = Moderate Risk for SUD  Score >8 = High Risk for Opioid Abuse   Risk of substance use disorder (SUD): Low  Risk Mitigation Strategies:  Patient opioid safety counseling: No controlled substances prescribed. Patient-Prescriber Agreement (PPA): No agreement signed.  Controlled substance notification to other providers: None required. No opioid  therapy.  Pharmacologic Plan: Non-opioid analgesic therapy offered. Interventional alternatives discussed.             Laboratory Chemistry Profile   Renal Lab Results  Component Value Date   BUN 11 12/21/2021   CREATININE 0.72 12/21/2021   BCR 12 11/30/2021   GFRAA >60 04/26/2018   GFRNONAA >60 12/21/2021   SPECGRAV 1.010 05/02/2019   PHUR 6.5 05/02/2019   PROTEINUR NEGATIVE 12/21/2021     Electrolytes Lab Results  Component Value Date   NA 137 12/21/2021   K 5.0 12/21/2021   CL 110 12/21/2021   CALCIUM 9.0 12/21/2021   MG 2.2 11/30/2021   PHOS 4.1 01/20/2009     Hepatic Lab Results  Component Value Date   AST 15 12/21/2021   ALT 22 12/21/2021   ALBUMIN 3.6 12/21/2021   ALKPHOS 25 (L) 12/21/2021   LIPASE 45 03/13/2021     ID Lab Results  Component Value Date   HIV NON REACTIVE 05/13/2009   [place] NEGATIVE 12/21/2021   MRSAPCR NEGATIVE 09/16/2015   PREGTESTUR NEGATIVE 04/26/2018     Bone Lab Results  Component Value Date   VD25OH 21.08 (L) 12/18/2021   25OHVITD1 12 (L) 11/30/2021   25OHVITD2 5.1 11/30/2021   25OHVITD3 7.3 11/30/2021     Endocrine Lab Results  Component Value Date   GLUCOSE 94 12/21/2021   GLUCOSEU NEGATIVE 12/21/2021   HGBA1C 5.5 12/11/2019   TSH 1.886 12/11/2019     Neuropathy Lab Results  Component Value Date   VITAMINB12 293 12/18/2021   FOLATE 8.3 11/30/2021   HGBA1C 5.5 12/11/2019   HIV NON REACTIVE 05/13/2009     CNS Lab Results  Component Value Date   COLORCSF COLORLESS 12/04/2021   APPEARCSF CLEAR 12/04/2021   RBCCOUNTCSF 39 (H) 12/04/2021   WBCCSF 2 12/04/2021   POLYSCSF 33 12/04/2021   LYMPHSCSF 50 12/04/2021   EOSCSF 0 12/04/2021   PROTEINCSF 16 12/04/2021   GLUCCSF 61 12/04/2021   CSFOLI Comment 12/04/2021  Inflammation (CRP: Acute  ESR: Chronic) Lab Results  Component Value Date   CRP <1 11/30/2021   ESRSEDRATE 3 11/30/2021   LATICACIDVEN 0.5 05/19/2009     Rheumatology No results  found for: "RF", "ANA", "LABURIC", "URICUR", "LYMEIGGIGMAB", "LYMEABIGMQN", "HLAB27"   Coagulation Lab Results  Component Value Date   INR 1.0 09/05/2008   LABPROT 13.8 09/05/2008   APTT 25 09/05/2008   PLT 341 12/21/2021   DDIMER  09/05/2008    0.45        AT THE INHOUSE ESTABLISHED CUTOFF VALUE OF 0.48 ug/mL FEU, THIS ASSAY HAS BEEN DOCUMENTED IN THE LITERATURE TO HAVE A SENSITIVITY AND NEGATIVE PREDICTIVE VALUE OF AT LEAST 98 TO 99%.  THE TEST RESULT SHOULD BE CORRELATED WITH AN ASSESSMENT OF THE CLINICAL PROBABILITY OF DVT / VTE.   VITAMINK1 <0.10 (L) 11/30/2021     Cardiovascular Lab Results  Component Value Date   BNP 79 07/20/2013   TROPONINI < 0.02 07/20/2013   HGB 13.1 12/21/2021   HCT 40.9 12/21/2021     Screening Lab Results  Component Value Date   SARSCOV2NAA NEGATIVE 12/21/2021   MRSAPCR NEGATIVE 09/16/2015   HIV NON REACTIVE 05/13/2009   PREGTESTUR NEGATIVE 04/26/2018     Cancer No results found for: "CEA", "CA125", "LABCA2"   Allergens No results found for: "ALMOND", "APPLE", "ASPARAGUS", "AVOCADO", "BANANA", "BARLEY", "BASIL", "BAYLEAF", "GREENBEAN", "LIMABEAN", "WHITEBEAN", "BEEFIGE", "REDBEET", "BLUEBERRY", "BROCCOLI", "CABBAGE", "MELON", "CARROT", "CASEIN", "CASHEWNUT", "CAULIFLOWER", "CELERY"     Note: Lab results reviewed.  Recent Diagnostic Imaging Review  Lumbosacral Imaging: Lumbar MR wo contrast: Results for orders placed during the hospital encounter of 12/09/21 MR LUMBAR SPINE WO CONTRAST  Narrative CLINICAL DATA:  Low back pain; extends into right leg  EXAM: MRI LUMBAR SPINE WITHOUT CONTRAST  TECHNIQUE: Multiplanar, multisequence MR imaging of the lumbar spine was performed. No intravenous contrast was administered.  COMPARISON:  04/20/2019  FINDINGS: Segmentation:  5 lumbar-type vertebral bodies.  Alignment: Straightening of the normal lumbar lordosis. Mild levocurvature. No listhesis.  Vertebrae:  No fracture,  evidence of discitis, or bone lesion.  Conus medullaris and cauda equina: Conus extends to the L1 level. Conus and cauda equina appear normal.  Paraspinal and other soft tissues: Subcentimeter left renal cysts, for which no follow-up is currently indicated.  Disc levels:  T12-L1: No significant disc bulge. No spinal canal stenosis or neural foraminal narrowing.  L1-L2: No significant disc bulge. No spinal canal stenosis or neural foraminal narrowing.  L2-L3: No significant disc bulge. No spinal canal stenosis or neural foraminal narrowing.  L3-L4: No significant disc bulge. No spinal canal stenosis or neural foraminal narrowing.  L4-L5: Disc desiccation and minimal disc bulge with central annular fissure. Mild facet arthropathy. No spinal canal stenosis or neural foraminal narrowing.  L5-S1: Disc height loss and mild disc bulge. Mild facet arthropathy. No spinal canal stenosis or neural foraminal narrowing.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

## 2022-01-10 NOTE — Patient Instructions (Signed)
  ____________________________________________________________________________________________  Patient Information update  To: All of our patients.  Re: Name change.  It has been made official that our current name, "[facility]"   will soon be changed to "[facility]".   The purpose of this change is to eliminate any confusion created by the concept of our practice being a "Medication Management Pain Clinic". In the past this has led to the misconception that we treat pain primarily by the use of prescription medications.  Nothing can be farther from the truth.   Understanding PAIN MANAGEMENT: To further understand what our practice does, you first have to understand that "Pain Management" is a subspecialty that requires additional training once a physician has completed their specialty training, which can be in either Anesthesia, Neurology, Psychiatry, or Physical Medicine and Rehabilitation (PMR). Each one of these contributes to the final approach taken by each physician to the management of their patient's pain. To be a "Pain Management Specialist" you must have first completed one of the specialty trainings below.  Anesthesiologists - trained in clinical pharmacology and interventional techniques such as nerve blockade and regional as well as central neuroanatomy. They are trained to block pain before, during, and after surgical interventions.  Neurologists - trained in the diagnosis and pharmacological treatment of complex neurological conditions, such as Multiple Sclerosis, Parkinson's, spinal cord injuries, and other systemic conditions that may be associated with symptoms that may include but are not limited to pain. They tend to rely primarily on the treatment of chronic pain using prescription medications.  Psychiatrist - trained in conditions affecting the psychosocial  wellbeing of patients including but not limited to depression, anxiety, schizophrenia, personality disorders, addiction, and other substance use disorders that may be associated with chronic pain. They tend to rely primarily on the treatment of chronic pain using prescription medications.   Physical Medicine and Rehabilitation (PMR) physicians, also known as physiatrists - trained to treat a wide variety of medical conditions affecting the brain, spinal cord, nerves, bones, joints, ligaments, muscles, and tendons. Their training is primarily aimed at treating patients that have suffered injuries that have caused severe physical impairment. Their training is primarily aimed at the physical therapy and rehabilitation of those patients. They may also work alongside orthopedic surgeons or neurosurgeons using their expertise in assisting surgical patients to recover after their surgeries.  INTERVENTIONAL PAIN MANAGEMENT is sub-subspecialty of Pain Management.  Our physicians are Board-certified in Anesthesia, Pain Management, and Interventional Pain Management.  This meaning that not only have they been trained and Board-certified in their specialty of Anesthesia, and subspecialty of Pain Management, but they have also received further training in the sub-subspecialty of Interventional Pain Management, in order to become Board-certified as INTERVENTIONAL PAIN MANAGEMENT SPECIALIST.    Mission: Our goal is to use our skills in  INTERVENTIONAL PAIN MANAGEMENT as alternatives to the chronic use of prescription opioid medications for the treatment of pain. To make this more clear, we have changed our name to reflect what we do and offer. We will continue to offer medication management assessment and recommendations, but we will not be taking over any patient's medication management.  ____________________________________________________________________________________________     

## 2022-01-11 ENCOUNTER — Ambulatory Visit: Payer: Medicare Other | Attending: Pain Medicine | Admitting: Pain Medicine

## 2022-01-11 ENCOUNTER — Encounter: Payer: Self-pay | Admitting: Pain Medicine

## 2022-01-11 VITALS — BP 115/66 | HR 82 | Temp 97.3°F | Ht 67.0 in | Wt 135.0 lb

## 2022-01-11 DIAGNOSIS — M5481 Occipital neuralgia: Secondary | ICD-10-CM | POA: Diagnosis present

## 2022-01-11 DIAGNOSIS — G8929 Other chronic pain: Secondary | ICD-10-CM | POA: Diagnosis present

## 2022-01-11 DIAGNOSIS — M542 Cervicalgia: Secondary | ICD-10-CM | POA: Insufficient documentation

## 2022-01-11 DIAGNOSIS — R937 Abnormal findings on diagnostic imaging of other parts of musculoskeletal system: Secondary | ICD-10-CM | POA: Insufficient documentation

## 2022-01-11 DIAGNOSIS — M79604 Pain in right leg: Secondary | ICD-10-CM | POA: Diagnosis present

## 2022-01-11 DIAGNOSIS — M5412 Radiculopathy, cervical region: Secondary | ICD-10-CM | POA: Insufficient documentation

## 2022-01-11 DIAGNOSIS — M25512 Pain in left shoulder: Secondary | ICD-10-CM | POA: Insufficient documentation

## 2022-01-11 DIAGNOSIS — G4486 Cervicogenic headache: Secondary | ICD-10-CM | POA: Insufficient documentation

## 2022-01-11 DIAGNOSIS — G971 Other reaction to spinal and lumbar puncture: Secondary | ICD-10-CM | POA: Insufficient documentation

## 2022-01-11 DIAGNOSIS — M5441 Lumbago with sciatica, right side: Secondary | ICD-10-CM | POA: Diagnosis present

## 2022-01-11 DIAGNOSIS — G609 Hereditary and idiopathic neuropathy, unspecified: Secondary | ICD-10-CM | POA: Diagnosis present

## 2022-01-11 DIAGNOSIS — M5137 Other intervertebral disc degeneration, lumbosacral region: Secondary | ICD-10-CM | POA: Diagnosis present

## 2022-01-11 DIAGNOSIS — E559 Vitamin D deficiency, unspecified: Secondary | ICD-10-CM | POA: Diagnosis present

## 2022-01-11 MED ORDER — PREDNISONE 20 MG PO TABS: ORAL_TABLET | ORAL | 0 refills | Status: AC

## 2022-01-11 NOTE — Progress Notes (Signed)
Safety precautions to be maintained throughout the outpatient stay will include: orient to surroundings, keep bed in low position, maintain call bell within reach at all times, provide assistance with transfer out of bed and ambulation.  

## 2022-01-19 ENCOUNTER — Telehealth (INDEPENDENT_AMBULATORY_CARE_PROVIDER_SITE_OTHER): Payer: Medicare Other | Admitting: Psychiatry

## 2022-01-19 ENCOUNTER — Encounter: Payer: Self-pay | Admitting: Psychiatry

## 2022-01-19 DIAGNOSIS — F410 Panic disorder [episodic paroxysmal anxiety] without agoraphobia: Secondary | ICD-10-CM | POA: Diagnosis not present

## 2022-01-19 DIAGNOSIS — F5101 Primary insomnia: Secondary | ICD-10-CM | POA: Diagnosis not present

## 2022-01-19 DIAGNOSIS — F3342 Major depressive disorder, recurrent, in full remission: Secondary | ICD-10-CM

## 2022-01-19 DIAGNOSIS — F4310 Post-traumatic stress disorder, unspecified: Secondary | ICD-10-CM

## 2022-01-19 NOTE — Progress Notes (Signed)
Virtual Visit via Video Note  I connected with [name] on 01/19/22 at  9:00 AM EST by a video enabled telemedicine application and verified that I am speaking with the correct person using two identifiers.  Location Provider Location : [facility] Patient Location : Home  Participants: Patient , Provider    I discussed the limitations of evaluation and management by telemedicine and the availability of in person appointments. The patient expressed understanding and agreed to proceed.   I discussed the assessment and treatment plan with the patient. The patient was provided an opportunity to ask questions and all were answered. The patient agreed with the plan and demonstrated an understanding of the instructions.   The patient was advised to call back or seek an in-person evaluation if the symptoms worsen or if the condition fails to improve as anticipated.   [facility] MD OP Progress Note  01/19/2022 12:37 PM [name]  MRN:  [id number]  Chief Complaint:  Chief Complaint  Patient presents with   Follow-up   Medication Refill   Anxiety   Depression   HPI: [name] is a 46 year old Caucasian female, divorced, lives in [place], has a history of MDD, panic disorder, PTSD, primary insomnia was evaluated by telemedicine today.  Patient today reports she is currently struggling with sinus pressure, flulike symptoms.  Reports she has reached out to her primary care provider.  Reports she continues to struggle with pain, recently had lumbar puncture which caused some post puncture headaches, completed a course of prednisone which did not help much, continues to follow-up with her providers.  Does have low back pain, which does affect her sleep at times.  Reports she continues to use Ambien which helps.  Denies side effects.  Patient otherwise reports overall she has been doing fairly well with regards to her mood.  Reports she stopped taking Lamictal 25 mg dosage and  is currently only taking Lamictal 200 mg daily.  She would like to stay on this dose right now.  Reports she is compliant on the Viibryd, Wellbutrin.  Denies side effects.  Patient denies any suicidality, homicidality or perceptual disturbances.  Reports she has started journaling and that has been beneficial.  She is not interested in reestablishing care with therapist.  Patient denies any other concerns today.  Visit Diagnosis:    ICD-10-CM   1. MDD (major depressive disorder), recurrent, in full remission ([place])  F33.42     2. Panic disorder  F41.0     3. PTSD (post-traumatic stress disorder)  F43.10     4. Primary insomnia  F51.01       Past Psychiatric History: Reviewed past psychiatric history from progress note on 05/09/2017.  Past Medical History:  Past Medical History:  Diagnosis Date   Abnormal ECG    Anemia    Anxiety    Arthritis    Asthma    B12 deficiency    Cardiomyopathy ([place])    Chronic abdominal pain    Complication of anesthesia    difficulty to get sedated during endoscopy   COVID-19 01/18/2019   DDD (degenerative disc disease), lumbar    Depression    Diabetes mellitus without complication ([place])    ONLY gestational diabetes, does not have chronic diabetes   Dysrhythmia    GERD (gastroesophageal reflux disease)    Iron deficiency    Migraine headache    Neuropathy    Obesity    gastric bypass 2009   Ovarian cyst  Pneumonia    within past five years   PTSD (post-traumatic stress disorder)    PTSD (post-traumatic stress disorder)    Rh negative status during pregnancy    Seizures ([place])    post-gestational   Small bowel obstruction (HCC)     Past Surgical History:  Procedure Laterality Date   ABDOMINAL HYSTERECTOMY     BILATERAL SALPINGECTOMY Bilateral 09/29/2015   Procedure: BILATERAL SALPINGECTOMY;  Surgeon: Benjaman Kindler, MD;  Location: [facility] ORS;  Service: Gynecology;  Laterality: Bilateral;   bowel obstruction  2011    CHOLECYSTECTOMY     COLONOSCOPY WITH PROPOFOL N/A 01/24/2018   Procedure: COLONOSCOPY WITH PROPOFOL;  Surgeon: Toledo, Benay Pike, MD;  Location: [facility] END[facility]COPY;  Service: Gastroenterology;  Laterality: N/A;   DILATION AND CURETTAGE OF UTERUS     ESOPHAGOGASTRODUODENOSCOPY     ESOPHAGOGASTRODUODENOSCOPY (EGD) WITH PROPOFOL N/A 01/24/2018   Procedure: ESOPHAGOGASTRODUODENOSCOPY (EGD) WITH PROPOFOL;  Surgeon: Toledo, Benay Pike, MD;  Location: [facility] ENDOSCOPY;  Service: Gastroenterology;  Laterality: N/A;   GASTRIC BYPASS  2010   gi bleed  2009   Surgery-was in ICU for three weeks   HERNIA REPAIR     OVARIAN CYST REMOVAL Left 09/29/2015   Procedure: OVARIAN CYSTECTOMY;  Surgeon: Benjaman Kindler, MD;  Location: [facility] OR[facility];  Service: Gynecology;  Laterality: Left;   ROUX-EN-Y PROCEDURE     VAGINAL HYSTERECTOMY N/A 09/29/2015   Procedure: HYSTERECTOMY VAGINAL;  Surgeon: Benjaman Kindler, MD;  Location: [facility] ORS;  Service: Gynecology;  Laterality: N/A;    Family Psychiatric History: Reviewed family psychiatric history from progress note on 05/09/2017.  Family History:  Family History  Problem Relation Age of Onset   Arthritis/Rheumatoid Mother    Heart block Mother    Clotting disorder Mother    Osteoporosis Mother    Heart attack Mother    Anxiety disorder Mother    Depression Mother    Colon polyps Mother    Stomach cancer Father 27   Heart disease Father    Heart attack Father    Depression Sister    Anxiety disorder Sister    Colon polyps Sister    Colon cancer Neg Hx    Esophageal cancer Neg Hx    Pancreatic cancer Neg Hx     Social History: Reviewed social history from progress note on 05/09/2017. Social History   Socioeconomic History   Marital status: Single    Spouse name: Not on file   Number of children: Not on file   Years of education: Not on file   Highest education level: Not on file  Occupational History   Not on file  Tobacco Use   Smoking status: Never    Smokeless tobacco: Never  Vaping Use   Vaping Use: Never used  Substance and Sexual Activity   Alcohol use: No    Alcohol/week: 0.0 standard drinks of alcohol   Drug use: No   Sexual activity: Yes    Partners: Male    Birth control/protection: Surgical  Other Topics Concern   Not on file  Social History Narrative   Lives with mother, daughter, son, and fiance. Pets, dogs, in home.   Social Determinants of Health   Financial Resource Strain: Not on file  Food Insecurity: Not on file  Transportation Needs: Not on file  Physical Activity: Not on file  Stress: Not on file  Social Connections: Not on file    Allergies:  Allergies  Allergen Reactions   Amoxicillin Anaphylaxis, Hives, Shortness Of Breath, Swelling  and Rash    Has patient had a PCN reaction causing immediate rash, facial/tongue/throat swelling, SOB or lightheadedness with hypotension: Yes Has patient had a PCN reaction causing severe rash involving mucus membranes or skin necrosis: Yes Has patient had a PCN reaction that required hospitalization No Has patient had a PCN reaction occurring within the last 10 years: No If all of the above answers are "NO", then may proceed with Cephalosporin use. Other reaction(s): ANAPHYLAXIS Other reaction(s): ANAPHYLAX   Penicillins Anaphylaxis, Swelling, Rash, Shortness Of Breath and Hives    Has patient had a PCN reaction causing immediate rash, facial/tongue/throat swelling, SOB or lightheadedness with hypotension: Yes Has patient had a PCN reaction causing severe rash involving mucus membranes or skin necrosis: Yes Has patient had a PCN reaction that required hospitalization No Has patient had a PCN reaction occurring within the last 10 years: No If all of the above answers are "NO", then may proceed with Cephalosporin use.  Other reaction(s): ANAPHYLAXIS Other reaction(s): ANAPHYLAXIS Has patient had a PCN reaction causing immediate rash, facial/tongue/throat swelling, SOB  or lightheadedness with hypotension: Yes Has patient had a PCN reaction causing severe rash involving mucus membranes or skin necrosis: Yes Has patient had a PCN reaction that required hospitalization No Has patient had a PCN reaction occurring within the last 10 years: No If all of the above answers are "NO", then may proceed with Cephalosporin use. Other reaction(s): ANAPHYLAXIS Other reaction(s): ANAPHYLAX Has patient had a PCN reaction causing immediate rash, facial/tongue/throat swelling, SOB or lightheadedness with hypotension: Yes Has patient had a PCN reaction causing severe rash involving mucus membranes or skin necrosis: Yes Has patient had a PCN reaction that required hospitalization No Has patient had a PCN reaction occurring within the last 10 years: No If all of the above answers are "NO", then may proceed with Cephalosporin use. Other reaction(s): ANAPHYLAXIS Other reaction(s): ANAPHYLAXIS    Morphine Other (See Comments)    Muscle spasms   Sulfa Antibiotics Nausea Only    Other reaction(s): VOMITING Other reaction(s): VOMITING Other reaction(s): VOMITING    Metabolic Disorder Labs: Lab Results  Component Value Date   HGBA1C 5.5 12/11/2019   MPG 111 12/11/2019   Lab Results  Component Value Date   PROLACTIN 12.1 12/11/2019   Lab Results  Component Value Date   CHOL 162 12/11/2019   TRIG 51 12/11/2019   HDL 87 12/11/2019   CHOLHDL 1.9 12/11/2019   VLDL 10 12/11/2019   LDLCALC 65 12/11/2019   Lab Results  Component Value Date   TSH 1.886 12/11/2019   TSH  05/13/2009    3.163 (NOTE) Please note change in reference ranges for ages 71W to 51Y. Test methodology is 3rd generation TSH    Therapeutic Level Labs: No results found for: "LITHIUM" No results found for: "VALPROATE" No results found for: "CBMZ"  Current Medications: Current Outpatient Medications  Medication Sig Dispense Refill   acetaminophen (TYLENOL) 325 MG tablet Take 650 mg by mouth every  4 (four) hours as needed for moderate pain.      AIMOVIG 140 MG/ML SOAJ SMARTSIG:140 Milligram(s) SUB-Q Every 4 Weeks     buPROPion (WELLBUTRIN XL) 150 MG 24 hr tablet Take 2 tablets (300 mg total) by mouth daily. 60 tablet 2   cyanocobalamin (,VITAMIN B-12,) 1000 MCG/ML injection Inject 1,000 mcg into the muscle every 30 (thirty) days.      diphenoxylate-atropine (LOMOTIL) 2.5-0.025 MG tablet Take 1 tablet by mouth as needed. 60 tablet 0  hydrOXYzine (ATARAX) 25 MG tablet TAKE 1/2 TO 1 TABLET(12.5 TO 25 MG) BY MOUTH TWICE DAILY AS NEEDED FOR ANXIETY 60 tablet 2   lamoTRIgine (LAMICTAL) 200 MG tablet TAKE 1/2 TABLET(100 MG) BY MOUTH TWICE DAILY 90 tablet 0   Multiple Vitamins-Minerals (HM MULTIVITAMIN ADULT GUMMY) CHEW Chew 2 tablets by mouth daily.     pantoprazole (PROTONIX) 40 MG tablet Take 1 tablet (40 mg total) by mouth 2 (two) times daily. 90 tablet 3   predniSONE (DELTASONE) 20 MG tablet Take 3 tablets (60 mg total) by mouth daily with breakfast for 3 days, THEN 2 tablets (40 mg total) daily with breakfast for 3 days, THEN 1 tablet (20 mg total) daily with breakfast for 3 days. 18 tablet 0   SUMAtriptan (IMITREX) 100 MG tablet Take 100 mg by mouth every 2 (two) hours as needed for migraine or headache.      thiamine (VITAMIN B-1) 100 MG tablet Take 100 mg by mouth.     topiramate (TOPAMAX) 50 MG tablet Take 100 mg by mouth 2 (two) times daily.     Vilazodone HCl (VIIBRYD) 40 MG TABS Take 1 tablet (40 mg total) by mouth daily. 90 tablet 0   Vitamin D, Ergocalciferol, (DRISDOL) 1.25 MG (50000 UNIT) CAPS capsule Take 1 capsule (50,000 Units total) by mouth every 7 (seven) days. TAKE 1 CAPSULE BY MOUTH DAILY FOR 14 DAYS THEN TAKE 1 CAPSULE BY MOUTH 1 TIME WEEKLY 13 capsule 1   zolpidem (AMBIEN CR) 12.5 MG CR tablet Take 1 tablet at bedtime 30 tablet 2   No current facility-administered medications for this visit.     Musculoskeletal: Strength & Muscle Tone:  UTA Gait & Station:   Seated Patient leans: N/A  Psychiatric Specialty Exam: Review of Systems  HENT:  Positive for congestion and sinus pain.   Musculoskeletal:  Positive for back pain.  Neurological:  Positive for headaches.  Psychiatric/Behavioral:  Positive for sleep disturbance.   All other systems reviewed and are negative.   There were no vitals taken for this visit.There is no height or weight on file to calculate BMI.  General Appearance: Casual  Eye Contact:  Fair  Speech:  Clear and Coherent  Volume:  Normal  Mood:  Euthymic  Affect:  Congruent  Thought Process:  Goal Directed and Descriptions of Associations: Intact  Orientation:  Full (Time, Place, and Person)  Thought Content: Logical   Suicidal Thoughts:  No  Homicidal Thoughts:  No  Memory:  Immediate;   Fair Recent;   Fair Remote;   Fair  Judgement:  Fair  Insight:  Fair  Psychomotor Activity:  Normal  Concentration:  Concentration: Fair and Attention Span: Fair  Recall:  [employer] of Knowledge: Fair  Language: Fair  Akathisia:  No  Handed:  Right  AIMS (if indicated): not done  Assets:  Communication Skills Desire for Improvement Housing Social Support Transportation  ADL's:  Intact  Cognition: WNL  Sleep:   restless due to pain   Screenings: AIMS    Flowsheet Row Video Visit from 06/16/2021 in [facility] Total Score 0      GAD-7    Flowsheet Row Video Visit from 06/16/2021 in [facility]  Total GAD-7 Score 7      PHQ2-9    [facility] Video Visit from 01/19/2022 in [facility] Video Visit from 10/06/2021 in [facility] Video Visit from 06/16/2021 in [facility] Video Visit from 04/16/2021 in [facility] Office Visit from 01/16/2021 in [facility]  PHQ-2 Total Score 0 '1 1 3 '$ 0  PHQ-9 Total Score -- '9 6 8 '$ --       Flowsheet Row Video Visit from 01/19/2022 in [facility] from 12/21/2021 in [facility] from 12/08/2021 in [facility] CATEGORY No Risk No Risk No Risk        Assessment and Plan: [name] is a 46 year old Caucasian female who has a history of depression, anxiety, insomnia was evaluated by internal medicine today.  Patient is currently struggling with pain, post puncture headache which does have an impact on her sleep although she is currently doing fairly well with regards to her mood on the current medication regimen.  Plan as noted below.  Plan MDD in remission Lamotrigine 200 mg p.o. daily, reduced dosage.  Discontinue lamotrigine 25 mg p.o. daily due to noncompliance. Wellbutrin XL 300 mg p.o. daily-reduced dosage Viibryd 40 mg p.o. daily  PTSD-stable Viibryd 40 mg p.o. daily Hydroxyzine 25 mg p.o. nightly   Panic disorder-stable Hydroxyzine 25 mg p.o. nightly as needed  Insomnia-unstable due to pain Will need sufficient pain management Ambien CR 12.5 mg p.o. nightly  Patient is not interested in psychotherapy, reports she has started journaling, encouraged her to continue to do so.  Follow-up in clinic in 3 months or sooner if needed.   Collaboration of Care: Collaboration of Care: Primary Care Provider AEB encouraged to follow up with primary care provider for her sinusitis, flulike symptoms.  Patient/Guardian was advised Release of Information must be obtained prior to any record release in order to collaborate their care with an outside provider. Patient/Guardian was advised if they have not already done so to contact the registration department to sign all necessary forms in order for [place] to release information regarding their care.   Consent: Patient/Guardian gives verbal consent for treatment and assignment of benefits for  services provided during this visit. Patient/Guardian expressed understanding and agreed to proceed.   This note was generated in part or whole with voice recognition software. Voice recognition is usually quite accurate but there are transcription errors that can and very often do occur. I apologize for any typographical errors that were not detected and corrected.      Ursula Alert, MD 01/19/2022, 12:37 PM

## 2022-01-21 ENCOUNTER — Telehealth: Payer: Self-pay

## 2022-01-21 NOTE — Telephone Encounter (Signed)
Attempted to call patient for VV on Monday. No answer, unable to leave message, mailbox full.

## 2022-01-24 NOTE — Progress Notes (Unsigned)
Patient: [name]  Service Category: E/M  Provider: Gaspar Cola, [name]  DOB: 1975-12-21  DOS: 01/25/2022  Location: Office  MRN: [id number]  Setting: Ambulatory outpatient  Referring Provider: Leonard [name], *  Type: Established Patient  Specialty: Interventional Pain Management  PCP: Elizabeth Downing, [name]  Location: Remote location  Delivery: TeleHealth     Virtual Encounter - Pain Management PROVIDER NOTE: Information contained herein reflects review and annotations entered in association with encounter. Interpretation of such information and data should be left to medically-trained personnel. Information provided to patient can be located elsewhere in the medical record under "Patient Instructions". Document created using STT-dictation technology, any transcriptional errors that may result from process are unintentional.    Contact & Pharmacy Preferred: [phone] Home: [phone] (home) Mobile: [phone] (mobile) E-mail: [id number] [address] [place] [address] Phone: [phone] Fax: [phone]  [facility] [place], [address] [place] [address] Phone: [phone] Fax: [phone]   Pre-screening  [name] offered "in-person" vs "virtual" encounter. She indicated preferring virtual for this encounter.   Reason COVID-19*  Social distancing based on CDC and AMA recommendations.   I contacted [name] on 01/25/2022 via telephone.      I clearly identified myself as Elizabeth Cola, [name]. I verified that I was speaking with the correct person using two identifiers (Name: [name], and date of birth: August 10, 1975).  Consent I sought verbal advanced consent from [name] for virtual visit interactions. I informed [name]  of possible security and privacy concerns, risks, and limitations associated with providing "not-in-person" medical evaluation and management services. I also informed [name] of the availability of "in-person" appointments. Finally, I informed her that there would be a charge for the virtual visit and that she could be  personally, fully or partially, financially responsible for it. [name] expressed understanding and agreed to proceed.   Historic Elements   [name] is a 46 y.o. year old, female patient evaluated today after our last contact on 01/11/2022. [name]  has a past medical history of Abnormal ECG, Anemia, Anxiety, Arthritis, Asthma, B12 deficiency, Cardiomyopathy ([place]), Chronic abdominal pain, Complication of anesthesia, COVID-19 (01/18/2019), DDD (degenerative disc disease), lumbar, Depression, Diabetes mellitus without complication ([place]), Dysrhythmia, GERD (gastroesophageal reflux disease), Iron deficiency, Migraine headache, Neuropathy, Obesity, Ovarian cyst, Pneumonia, PTSD (post-traumatic stress disorder), PTSD (post-traumatic stress disorder), Rh negative status during pregnancy, Seizures ([place]), and Small bowel obstruction ([place]). She also  has a past surgical history that includes Gastric bypass (2010); Cholecystectomy; Hernia repair; gi bleed (2009); bowel obstruction (2011); Vaginal hysterectomy (N/A, 09/29/2015); Bilateral salpingectomy (Bilateral, 09/29/2015); Ovarian cyst removal (Left, 09/29/2015); Abdominal hysterectomy; Dilation and curettage of uterus; Esophagogastroduodenoscopy; Roux-en-y procedure; Esophagogastroduodenoscopy (egd) with propofol (N/A, 01/24/2018); and Colonoscopy with propofol (N/A, 01/24/2018). [name] has a current medication list which includes the following prescription(s): acetaminophen, aimovig, bupropion, cyanocobalamin, diphenoxylate-atropine, hydroxyzine, lamotrigine, hm multivitamin adult gummy, pantoprazole, sumatriptan,  topiramate, vilazodone hcl, vitamin d (ergocalciferol), zolpidem, and thiamine. She  reports that she has never smoked. She has never used smokeless tobacco. She reports that she does not drink alcohol and does not use drugs. [name] is allergic to amoxicillin, penicillins, morphine, and sulfa antibiotics.  Estimated body mass index is 21.14 kg/m as calculated from the following:  Height as of 01/11/22: _0  (1.702 m).   Weight as of 01/11/22: 135 lb (61.2 kg).  HPI  Today, she is being contacted for  ***   Pharmacotherapy Assessment   Opioid Analgesic: No chronic opioid analgesics therapy prescribed by our practice. Hydrocodone/APAP 7.5/325, 1 tab p.o. 4 times daily (# 120) prescribed by Elizabeth Alert, [name] (last filled on 12/12/2021) MME/day: 30 mg/day   Monitoring: [place] PMP: PDMP reviewed during this encounter.       Pharmacotherapy: No side-effects or adverse reactions reported. Compliance: No problems identified. Effectiveness: Clinically acceptable. Plan: Refer to "POC". UDS:  Summary  Date Value Ref Range Status  11/30/2021 Note  Final    Comment:    ==================================================================== Compliance Drug Analysis, Ur ==================================================================== Test                             Result       Flag       Units  Drug Present   Topiramate                     PRESENT   Lamotrigine                    PRESENT   Zolpidem                       PRESENT   Zolpidem Acid                  PRESENT    Zolpidem acid is an expected metabolite of zolpidem.    Bupropion                      PRESENT   Hydroxybupropion               PRESENT    Hydroxybupropion is an expected metabolite of bupropion.    Vilazodone                     PRESENT   Acetaminophen                  PRESENT ==================================================================== Test                      Result    Flag   Units      Ref Range    Creatinine              36               mg/dL      >=20 ==================================================================== Declared Medications:  Medication list was not provided. ==================================================================== For clinical consultation, please call [phone]. ====================================================================    No results found for: "CBDTHCR", "D8THCCBX", "D9THCCBX"   Laboratory Chemistry Profile   Renal Lab Results  Component Value Date   BUN 11 12/21/2021   CREATININE 0.72 12/21/2021   BCR 12 11/30/2021   GFRAA >60 04/26/2018   GFRNONAA >60 12/21/2021    Hepatic Lab Results  Component Value Date   AST 15 12/21/2021   ALT 22 12/21/2021   ALBUMIN 3.6 12/21/2021   ALKPHOS 25 (L) 12/21/2021   LIPASE 45 03/13/2021    Electrolytes Lab Results  Component Value Date   NA 137 12/21/2021   K 5.0 12/21/2021   CL 110 12/21/2021   CALCIUM 9.0 12/21/2021   MG 2.2 11/30/2021   PHOS  4.1 01/20/2009    Bone Lab Results  Component Value Date   VD25OH 21.08 (L) 12/18/2021   25OHVITD1 12 (L) 11/30/2021   25OHVITD2 5.1 11/30/2021   25OHVITD3 7.3 11/30/2021    Inflammation (CRP: Acute Phase) (ESR: Chronic Phase) Lab Results  Component Value Date   CRP <1 11/30/2021   ESRSEDRATE 3 11/30/2021   LATICACIDVEN 0.5 05/19/2009         Note: Above Lab results reviewed.  Imaging  MM 3D SCREEN BREAST BILATERAL CLINICAL DATA:  Screening.  EXAM: DIGITAL SCREENING BILATERAL MAMMOGRAM WITH TOMOSYNTHESIS AND CAD  TECHNIQUE: Bilateral screening digital craniocaudal and mediolateral oblique mammograms were obtained. Bilateral screening digital breast tomosynthesis was performed. The images were evaluated with computer-aided detection.  COMPARISON:  None available.  ACR Breast Density Category c: The breast tissue is heterogeneously dense, which may obscure small masses  FINDINGS: There are no findings suspicious  for malignancy.  IMPRESSION: No mammographic evidence of malignancy. A result letter of this screening mammogram will be mailed directly to the patient.  RECOMMENDATION: Screening mammogram in one year. (Code:SM-B-01Y)  BI-RADS CATEGORY  1: Negative.  Electronically Signed   By: Everlean Alstrom M.D.   On: 12/30/2021 15:38  Assessment  There were no encounter diagnoses.  Plan of Care  Problem-specific:  No problem-specific Assessment & Plan notes found for this encounter.  [name] has a current medication list which includes the following long-term medication(s): bupropion, lamotrigine, pantoprazole, sumatriptan, topiramate, vilazodone hcl, and zolpidem.  Pharmacotherapy (Medications Ordered): No orders of the defined types were placed in this encounter.  Orders:  No orders of the defined types were placed in this encounter.  Follow-up plan:   No follow-ups on file.     Interventional Therapies  Risk  Complexity Considerations:   ANAPHYLAxis: Amoxicillin, Penicillin. ALLERGY: Sulfa Abx, MORPHINE   Planned  Pending:   9-day opioid taper (01/11/2022)    Under consideration:   Diagnostic bilateral lesser occipital NB #1    Completed:   Therapeutic L3-4 epidural blood patch x1 (12/08/2021) (100/100/100/100)    Completed by other providers:   Diagnostic/therapeutic midline CESI (C7-T1) x1 (10/16/2021) by Wyn Quaker, [name] ([facility])  Diagnostic/therapeutic left subacromial IA shoulder inj. x1 (11/06/2021) by Dr. Kayleen Memos [facility])  Therapeutic bilateral lumbar facet L2-5 MB RFA x1 (03/21/2013) by Kandace Blitz, [name] ([facility])  Therapeutic right lumbar facet L2-5 MB RFA x1 (05/30/2014) by Kandace Blitz, [name] ([facility])  Therapeutic left lumbar facet L2-5 MB RFA x1 (06/19/2014) by Kandace Blitz, [name] ([facility])  EMG/NCS (12/11/2014) by Rhea Bleacher, [name] ([facility]) Dx: No evidence of large fiber peripheral neuropathy.    Therapeutic   Palliative (PRN) options:   None established   Pharmacological Recommendations:   None at this time.      Recent Visits Date Type Provider Dept  01/11/22 Office Visit Milinda Pointer, [name] [facility]  12/08/21 Procedure visit Milinda Pointer, [name] [facility]  11/30/21 Office Visit Milinda Pointer, [name] [facility]  Showing recent visits within past 90 days and meeting all other requirements Future Appointments Date Type Provider Dept  01/25/22 Office Visit Milinda Pointer, [name] [facility]  Showing future appointments within next 90 days and meeting all other requirements  I discussed the assessment and treatment plan with the patient. The patient was provided an opportunity to ask questions and all were answered. The patient agreed with the plan and demonstrated an understanding of the instructions.  Patient advised to call back or  seek an in-person evaluation if the symptoms or condition worsens.  Duration of encounter: *** minutes.  Note by: Elizabeth Cola, [name] Date: 01/25/2022; Time: 11:12 AM

## 2022-01-24 NOTE — Patient Instructions (Signed)
  ____________________________________________________________________________________________  Patient Information update  To: All of our patients.  Re: Name change.  It has been made official that our current name, "[facility]"   will soon be changed to "[facility]".   The purpose of this change is to eliminate any confusion created by the concept of our practice being a "Medication Management Pain Clinic". In the past this has led to the misconception that we treat pain primarily by the use of prescription medications.  Nothing can be farther from the truth.   Understanding PAIN MANAGEMENT: To further understand what our practice does, you first have to understand that "Pain Management" is a subspecialty that requires additional training once a physician has completed their specialty training, which can be in either Anesthesia, Neurology, Psychiatry, or Physical Medicine and Rehabilitation (PMR). Each one of these contributes to the final approach taken by each physician to the management of their patient's pain. To be a "Pain Management Specialist" you must have first completed one of the specialty trainings below.  Anesthesiologists - trained in clinical pharmacology and interventional techniques such as nerve blockade and regional as well as central neuroanatomy. They are trained to block pain before, during, and after surgical interventions.  Neurologists - trained in the diagnosis and pharmacological treatment of complex neurological conditions, such as Multiple Sclerosis, Parkinson's, spinal cord injuries, and other systemic conditions that may be associated with symptoms that may include but are not limited to pain. They tend to rely primarily on the treatment of chronic pain using prescription medications.  Psychiatrist - trained in conditions affecting the psychosocial  wellbeing of patients including but not limited to depression, anxiety, schizophrenia, personality disorders, addiction, and other substance use disorders that may be associated with chronic pain. They tend to rely primarily on the treatment of chronic pain using prescription medications.   Physical Medicine and Rehabilitation (PMR) physicians, also known as physiatrists - trained to treat a wide variety of medical conditions affecting the brain, spinal cord, nerves, bones, joints, ligaments, muscles, and tendons. Their training is primarily aimed at treating patients that have suffered injuries that have caused severe physical impairment. Their training is primarily aimed at the physical therapy and rehabilitation of those patients. They may also work alongside orthopedic surgeons or neurosurgeons using their expertise in assisting surgical patients to recover after their surgeries.  INTERVENTIONAL PAIN MANAGEMENT is sub-subspecialty of Pain Management.  Our physicians are Board-certified in Anesthesia, Pain Management, and Interventional Pain Management.  This meaning that not only have they been trained and Board-certified in their specialty of Anesthesia, and subspecialty of Pain Management, but they have also received further training in the sub-subspecialty of Interventional Pain Management, in order to become Board-certified as INTERVENTIONAL PAIN MANAGEMENT SPECIALIST.    Mission: Our goal is to use our skills in  INTERVENTIONAL PAIN MANAGEMENT as alternatives to the chronic use of prescription opioid medications for the treatment of pain. To make this more clear, we have changed our name to reflect what we do and offer. We will continue to offer medication management assessment and recommendations, but we will not be taking over any patient's medication management.  ____________________________________________________________________________________________     

## 2022-01-25 ENCOUNTER — Ambulatory Visit: Payer: Medicare Other | Attending: Pain Medicine | Admitting: Pain Medicine

## 2022-01-25 DIAGNOSIS — M542 Cervicalgia: Secondary | ICD-10-CM

## 2022-01-25 DIAGNOSIS — G4486 Cervicogenic headache: Secondary | ICD-10-CM | POA: Diagnosis not present

## 2022-02-03 ENCOUNTER — Ambulatory Visit: Payer: Medicare Other | Admitting: Internal Medicine

## 2022-02-23 ENCOUNTER — Telehealth: Payer: Self-pay | Admitting: Psychiatry

## 2022-02-23 DIAGNOSIS — F3342 Major depressive disorder, recurrent, in full remission: Secondary | ICD-10-CM

## 2022-02-23 DIAGNOSIS — F5101 Primary insomnia: Secondary | ICD-10-CM

## 2022-02-23 MED ORDER — BELSOMRA 5 MG PO TABS: 5.0000 mg | ORAL_TABLET | Freq: Every day | ORAL | 0 refills | Status: DC

## 2022-02-23 NOTE — Telephone Encounter (Signed)
Patient reports she has been having trouble sleeping on the Ambien.  Patient would like to try temazepam.  She reports she read on the Internet that if you develop a tolerance to Ambien temazepam may be a good choice.  Discussed with patient since she is on opioid medications benzodiazepines like temazepam may not be a good choice due to major interaction.  Also provided education that medications like Ambien also could interact with her opioid medications.  Since patient has major sleep problems patient has been taking the Ambien for a long time and it was beneficial in the past until now.  Discussed trial of Belsomra, provided medication education including drug to drug interaction with opioids.  Patient reports she does not take the opioids as prescribed and uses it sparingly only.  Okay to trial of Belsomra 5 mg for now, will increase the dosage as needed in the future.  Reviewed [place] PMP [place].  Also discussed with patient that she need to talk to her pain provider regarding clearance for sleep medications like temazepam and other similar medications due to major drug to drug interaction with her opioids.  Patient agrees to get in touch with her pain provider.  Discussed with patient she may need to make a sooner appointment for evaluation if she continues to have sleep issues.

## 2022-02-24 ENCOUNTER — Telehealth: Payer: Self-pay

## 2022-02-24 DIAGNOSIS — F5101 Primary insomnia: Secondary | ICD-10-CM

## 2022-02-24 MED ORDER — BELSOMRA 5 MG PO TABS: 5.0000 mg | ORAL_TABLET | Freq: Every day | ORAL | 0 refills | Status: DC

## 2022-02-24 NOTE — Telephone Encounter (Signed)
pt states that the pharmacy will not fill the belsomra unless it it 10 or more. you sent in 8

## 2022-02-24 NOTE — Telephone Encounter (Signed)
I have sent Belsomra 10 days supply to pharmacy as requested.

## 2022-02-24 NOTE — Telephone Encounter (Signed)
pt states that she got notified that the belsomra was over a $100 so she will speak with you tomorrow about something else

## 2022-02-24 NOTE — Telephone Encounter (Signed)
Noted  

## 2022-02-25 ENCOUNTER — Ambulatory Visit (INDEPENDENT_AMBULATORY_CARE_PROVIDER_SITE_OTHER): Payer: Medicare Other | Admitting: Psychiatry

## 2022-02-25 ENCOUNTER — Encounter: Payer: Self-pay | Admitting: Psychiatry

## 2022-02-25 VITALS — BP 123/80 | HR 74 | Temp 98.3°F | Ht 67.75 in | Wt 139.0 lb

## 2022-02-25 DIAGNOSIS — Z91199 Patient's noncompliance with other medical treatment and regimen due to unspecified reason: Secondary | ICD-10-CM

## 2022-02-25 DIAGNOSIS — F4310 Post-traumatic stress disorder, unspecified: Secondary | ICD-10-CM | POA: Diagnosis not present

## 2022-02-25 DIAGNOSIS — F330 Major depressive disorder, recurrent, mild: Secondary | ICD-10-CM | POA: Diagnosis not present

## 2022-02-25 DIAGNOSIS — F5101 Primary insomnia: Secondary | ICD-10-CM

## 2022-02-25 DIAGNOSIS — F410 Panic disorder [episodic paroxysmal anxiety] without agoraphobia: Secondary | ICD-10-CM

## 2022-02-25 MED ORDER — TEMAZEPAM 7.5 MG PO CAPS: 7.5000 mg | ORAL_CAPSULE | Freq: Every evening | ORAL | 0 refills | Status: DC | PRN

## 2022-02-25 NOTE — Progress Notes (Signed)
[name] OP Progress Note  02/25/2022 6:22 PM [name]  MRN:  [id number]  Chief Complaint:  Chief Complaint  Patient presents with   Follow-up   Anxiety   Depression   Insomnia   HPI: [name] is a 47 year old Caucasian female, divorced, lives in [place], has a history of MDD, panic disorder, PTSD, primary insomnia, history of seizure disorder, chronic pain, history of gastric bypass 2009, small bowel obstruction, was evaluated in office today.  Patient today reports she has been struggling with multiple situational stressors.  Her father was diagnosed with terminal cancer, having to support her mother through this.  Patient reports she has been going back and forth to [facility] on a regular basis trying to take her parents for their doctor's appointments.  This has been extremely stressful for her.  Patient tearful in session today.  Patient reports she is currently journaling.  That likely helpful.  She however reports she has not been in psychotherapy.  Patient reports sleep issues.  Reports the zolpidem is not beneficial like it used to be.  Unknown if anxiety contributing to this.  Patient reports she is able to fall asleep on the zolpidem however 3 hours later she is up and has racing thoughts.  Patient also with chronic pain, currently on opioid medications which she takes every 6 hours.  Patient reports she did not take it for 3 days due to constipation however prior to that she was taking it as prescribed.  Patient reports she has to stay on the medication since she is in a lot of pain and nothing seems to be helpful other than this medication which helps to some extent.  Patient would like to try temazepam.  She tried the Belsomra last night, she felt it did the opposite for her and did not help her to sleep.  She hence does not want to be on this medication and it is quite expensive for her.  Patient denies any suicidality, homicidality or perceptual  disturbances.  Patient declines making any changes with her psychotropic medications to address her anxiety and depression today reporting that she does not want any changes since she is already dealing with a lot in her life.  Patient became tearful again at this.  Patient also hesitant to try psychotherapy reporting that she is a very private person and does not want to talk about anything.  Patient was provided education about CBT I, encouraged compliance with recommendations.    Visit Diagnosis:    ICD-10-CM   1. MDD (major depressive disorder), recurrent episode, mild ([place])  F33.0     2. Panic disorder  F41.0     3. PTSD (post-traumatic stress disorder)  F43.10     4. Primary insomnia  F51.01 temazepam (RESTORIL) 7.5 MG capsule    5. Noncompliance with treatment plan  Z91.199       Past Psychiatric History: Reviewed past psychiatric history from progress note on 05/09/2017.  Past Medical History:  Past Medical History:  Diagnosis Date   Abnormal ECG    Anemia    Anxiety    Arthritis    Asthma    B12 deficiency    Cardiomyopathy ([place])    Chronic abdominal pain    Complication of anesthesia    difficulty to get sedated during endoscopy   COVID-19 01/18/2019   DDD (degenerative disc disease), lumbar    Depression    Diabetes mellitus without complication ([place])    ONLY gestational diabetes, does  not have chronic diabetes   Dysrhythmia    GERD (gastroesophageal reflux disease)    Iron deficiency    Migraine headache    Neuropathy    Obesity    gastric bypass 2009   Ovarian cyst    Pneumonia    within past five years   PTSD (post-traumatic stress disorder)    PTSD (post-traumatic stress disorder)    Rh negative status during pregnancy    Seizures ([place])    post-gestational   Small bowel obstruction ([facility])     Past Surgical History:  Procedure Laterality Date   ABDOMINAL HYSTERECTOMY     BILATERAL SALPINGECTOMY Bilateral 09/29/2015   Procedure: BILATERAL  SALPINGECTOMY;  Surgeon: Benjaman Kindler, MD;  Location: [facility] ORS;  Service: Gynecology;  Laterality: Bilateral;   bowel obstruction  2011   CHOLECYSTECTOMY     COLONOSCOPY WITH PROPOFOL N/A 01/24/2018   Procedure: COLONOSCOPY WITH PROPOFOL;  Surgeon: Toledo, Benay Pike, MD;  Location: [facility] ENDOSCOPY;  Service: Gastroenterology;  Laterality: N/A;   DILATION AND CURETTAGE OF UTERUS     ESOPHAGOGASTRODUODENOSCOPY     ESOPHAGOGASTRODUODENOSCOPY (EGD) WITH PROPOFOL N/A 01/24/2018   Procedure: ESOPHAGOGASTRODUODENOSCOPY (EGD) WITH PROPOFOL;  Surgeon: Toledo, Benay Pike, MD;  Location: [facility] ENDOSCOPY;  Service: Gastroenterology;  Laterality: N/A;   GASTRIC BYPASS  2010   gi bleed  2009   Surgery-was in ICU for three weeks   HERNIA REPAIR     OVARIAN CYST REMOVAL Left 09/29/2015   Procedure: OVARIAN CYSTECTOMY;  Surgeon: Benjaman Kindler, MD;  Location: [facility] ORS;  Service: Gynecology;  Laterality: Left;   ROUX-EN-Y PROCEDURE     VAGINAL HYSTERECTOMY N/A 09/29/2015   Procedure: HYSTERECTOMY VAGINAL;  Surgeon: Benjaman Kindler, MD;  Location: [facility] ORS;  Service: Gynecology;  Laterality: N/A;    Family Psychiatric History: Reviewed family psychiatric history from progress note on 05/09/2017.  Family History:  Family History  Problem Relation Age of Onset   Arthritis/Rheumatoid Mother    Heart block Mother    Clotting disorder Mother    Osteoporosis Mother    Heart attack Mother    Anxiety disorder Mother    Depression Mother    Colon polyps Mother    Stomach cancer Father 48   Heart disease Father    Heart attack Father    Depression Sister    Anxiety disorder Sister    Colon polyps Sister    Colon cancer Neg Hx    Esophageal cancer Neg Hx    Pancreatic cancer Neg Hx     Social History: Reviewed social history from progress note on 05/09/2017. Social History   Socioeconomic History   Marital status: Single    Spouse name: Not on file   Number of children: Not on file   Years of  education: Not on file   Highest education level: Not on file  Occupational History   Not on file  Tobacco Use   Smoking status: Never   Smokeless tobacco: Never  Vaping Use   Vaping Use: Never used  Substance and Sexual Activity   Alcohol use: No    Alcohol/week: 0.0 standard drinks of alcohol   Drug use: No   Sexual activity: Yes    Partners: Male    Birth control/protection: Surgical  Other Topics Concern   Not on file  Social History Narrative   Lives with mother, daughter, son, and fiance. Pets, dogs, in home.   Social Determinants of Health   Financial Resource Strain: Not on file  Food  Insecurity: Not on file  Transportation Needs: Not on file  Physical Activity: Not on file  Stress: Not on file  Social Connections: Not on file    Allergies:  Allergies  Allergen Reactions   Amoxicillin Anaphylaxis, Hives, Shortness Of Breath, Swelling and Rash    Has patient had a PCN reaction causing immediate rash, facial/tongue/throat swelling, SOB or lightheadedness with hypotension: Yes Has patient had a PCN reaction causing severe rash involving mucus membranes or skin necrosis: Yes Has patient had a PCN reaction that required hospitalization No Has patient had a PCN reaction occurring within the last 10 years: No If all of the above answers are "NO", then may proceed with Cephalosporin use. Other reaction(s): ANAPHYLAXIS Other reaction(s): ANAPHYLAX   Penicillins Anaphylaxis, Swelling, Rash, Shortness Of Breath and Hives    Has patient had a PCN reaction causing immediate rash, facial/tongue/throat swelling, SOB or lightheadedness with hypotension: Yes Has patient had a PCN reaction causing severe rash involving mucus membranes or skin necrosis: Yes Has patient had a PCN reaction that required hospitalization No Has patient had a PCN reaction occurring within the last 10 years: No If all of the above answers are "NO", then may proceed with Cephalosporin use.  Other  reaction(s): ANAPHYLAXIS Other reaction(s): ANAPHYLAXIS Has patient had a PCN reaction causing immediate rash, facial/tongue/throat swelling, SOB or lightheadedness with hypotension: Yes Has patient had a PCN reaction causing severe rash involving mucus membranes or skin necrosis: Yes Has patient had a PCN reaction that required hospitalization No Has patient had a PCN reaction occurring within the last 10 years: No If all of the above answers are "NO", then may proceed with Cephalosporin use. Other reaction(s): ANAPHYLAXIS Other reaction(s): ANAPHYLAX Has patient had a PCN reaction causing immediate rash, facial/tongue/throat swelling, SOB or lightheadedness with hypotension: Yes Has patient had a PCN reaction causing severe rash involving mucus membranes or skin necrosis: Yes Has patient had a PCN reaction that required hospitalization No Has patient had a PCN reaction occurring within the last 10 years: No If all of the above answers are "NO", then may proceed with Cephalosporin use. Other reaction(s): ANAPHYLAXIS Other reaction(s): ANAPHYLAXIS    Morphine Other (See Comments)    Muscle spasms   Sulfa Antibiotics Nausea Only    Other reaction(s): VOMITING Other reaction(s): VOMITING Other reaction(s): VOMITING    Metabolic Disorder Labs: Lab Results  Component Value Date   HGBA1C 5.5 12/11/2019   MPG 111 12/11/2019   Lab Results  Component Value Date   PROLACTIN 12.1 12/11/2019   Lab Results  Component Value Date   CHOL 162 12/11/2019   TRIG 51 12/11/2019   HDL 87 12/11/2019   CHOLHDL 1.9 12/11/2019   VLDL 10 12/11/2019   LDLCALC 65 12/11/2019   Lab Results  Component Value Date   TSH 1.886 12/11/2019   TSH  05/13/2009    3.163 (NOTE)  Please note change in reference ranges for ages 73W to 35Y.Test methodology is 3rd generation TSH    Therapeutic Level Labs: No results found for: "LITHIUM" No results found for: "VALPROATE" No results found for:  "CBMZ"  Current Medications: Current Outpatient Medications  Medication Sig Dispense Refill   acetaminophen (TYLENOL) 325 MG tablet Take 650 mg by mouth every 4 (four) hours as needed for moderate pain.      AIMOVIG 140 MG/ML SOAJ SMARTSIG:140 Milligram(s) SUB-Q Every 4 Weeks     buPROPion (WELLBUTRIN XL) 150 MG 24 hr tablet Take 2 tablets (300 mg total)  by mouth daily. 60 tablet 2   cyanocobalamin (,VITAMIN B-12,) 1000 MCG/ML injection Inject 1,000 mcg into the muscle every 30 (thirty) days.      diphenoxylate-atropine (LOMOTIL) 2.5-0.025 MG tablet Take 1 tablet by mouth as needed. 60 tablet 0   HYDROcodone-acetaminophen (NORCO) 7.5-325 MG tablet Take 1 tablet by mouth every 6 (six) hours as needed for moderate pain.     hydrOXYzine (ATARAX) 25 MG tablet TAKE 1/2 TO 1 TABLET(12.5 TO 25 MG) BY MOUTH TWICE DAILY AS NEEDED FOR ANXIETY 60 tablet 2   lamoTRIgine (LAMICTAL) 200 MG tablet TAKE 1/2 TABLET(100 MG) BY MOUTH TWICE DAILY 90 tablet 0   Multiple Vitamins-Minerals (HM MULTIVITAMIN ADULT GUMMY) CHEW Chew 2 tablets by mouth daily.     pantoprazole (PROTONIX) 40 MG tablet Take 1 tablet (40 mg total) by mouth 2 (two) times daily. 90 tablet 3   SUMAtriptan (IMITREX) 100 MG tablet Take 100 mg by mouth every 2 (two) hours as needed for migraine or headache.      temazepam (RESTORIL) 7.5 MG capsule Take 1 capsule (7.5 mg total) by mouth at bedtime as needed for up to 15 days for sleep. 15 capsule 0   topiramate (TOPAMAX) 50 MG tablet Take 100 mg by mouth 2 (two) times daily.     Vilazodone HCl (VIIBRYD) 40 MG TABS Take 1 tablet (40 mg total) by mouth daily. 90 tablet 0   Vitamin D, Ergocalciferol, (DRISDOL) 1.25 MG (50000 UNIT) CAPS capsule Take 1 capsule (50,000 Units total) by mouth every 7 (seven) days. TAKE 1 CAPSULE BY MOUTH DAILY FOR 14 DAYS THEN TAKE 1 CAPSULE BY MOUTH 1 TIME WEEKLY 13 capsule 1   No current facility-administered medications for this visit.     Musculoskeletal: Strength &  Muscle Tone: within normal limits Gait & Station: normal Patient leans: N/A  Psychiatric Specialty Exam: Review of Systems  Musculoskeletal:  Positive for back pain.  Psychiatric/Behavioral:  Positive for dysphoric mood and sleep disturbance. The patient is nervous/anxious.   All other systems reviewed and are negative.   Blood pressure 123/80, pulse 74, temperature 98.3 F (36.8 C), height 5' 7.75" (1.721 m), weight 139 lb (63 kg), SpO2 99 %.Body mass index is 21.29 kg/m.  General Appearance: Casual  Eye Contact:  Fair  Speech:  Clear and Coherent  Volume:  Normal  Mood:  Anxious and Depressed, reports racing thoughts  Affect:  Tearful  Thought Process:  Goal Directed and Descriptions of Associations: Intact  Orientation:  Full (Time, Place, and Person)  Thought Content: Rumination   Suicidal Thoughts:  No  Homicidal Thoughts:  No  Memory:  Immediate;   Fair Recent;   Fair Remote;   Fair  Judgement:  Fair  Insight:  Fair  Psychomotor Activity:  Normal  Concentration:  Concentration: Fair and Attention Span: Fair  Recall:  [employer] of Knowledge: Fair  Language: Fair  Akathisia:  No  Handed:  Right  AIMS (if indicated): not done  Assets:  Communication Skills Desire for [place] Talents/Skills  ADL's:  Intact  Cognition: WNL  Sleep:  Poor   Screenings: AIMS    Flowsheet Row Video Visit from 06/16/2021 in [place] Total Score 0      [place] Office Visit from 02/25/2022 in [place] Video Visit from 06/16/2021 in [facility]  Total GAD-7 Score 7 7      PHQ2-9  [place] Office Visit from 02/25/2022 in [place] Video Visit from 01/19/2022 in [place] Video Visit from 10/06/2021 in [place] Video Visit from 06/16/2021 in [facility] Video Visit from 04/16/2021 in [facility]  PHQ-2 Total Score 2 0 '1 1 3  '$ PHQ-9 Total Score 6 -- '9 6 8      '$ [place] Office Visit from 02/25/2022 in [place] Video Visit from 01/19/2022 in [place] ED from 12/21/2021 in [place] CATEGORY No Risk No Risk No Risk        Assessment and Plan: CYNITHA BERTE is a 47 year old Caucasian female, has a history of depression, anxiety, insomnia was evaluated in the office today.  Patient currently with multiple psychosocial stressors including terminal illness of her father, her own chronic pain, worsening sleep problems, noncompliant with treatment recommendations, will benefit from the following plan today.  Plan MDD-unstable Discussed readjusting the dosages of her psychotropic medications.  Patient however declines.  Reports she is currently going through terminal illness of her father, trying to support him and she is not interested in making any medication adjustments and does not want to go through those changes at this time. Hence will renew lamotrigine 200 mg p.o. daily-reduced dosage Continue Wellbutrin XL 300 mg p.o. daily-reduced dosage.  However discussed with patient Wellbutrin at this high dosage likely also contributing to sleep issues and will consider lowering the dosage.  Patient hesitant to make any changes today. Continue Viibryd 40 mg p.o. daily for now.  However we will consider readjusting the dosages of this medication or changing to another SSRI/SNRI if patient does not improve.  Patient advised today.  PTSD-stable Viibryd 40 mg p.o. daily Hydroxyzine as prescribed 25 mg p.o. nightly as needed  Panic disorder-stable Hydroxyzine 25 mg p.o. nightly as needed  Insomnia-unstable Discontinue Belsomra-patient only tried it for 1 night however  does not want to stay on this medication due to cost as well as the 5 mg did not help her to sleep.  Patient does report a lot of racing thoughts and may have benefited from a higher dosage as well as management of her anxiety which could be contributing to sleep issues.  Patient however does not want to stay on it at this time. Will start low dosage temazepam 7.5 mg based on patient request.  Coordinated care with pain provider since patient is on opioids. ( Per mychart message review from provider 02/23/22 - OK to prescribe Restoril) However discussed with patient that this medication will be provided only for limited time that is 6 to 8 weeks and she will be kept on a low dosage, dosage will not be increased higher than 7.5 mg since she is currently on opioids.  Provided education about drug to drug interaction with temazepam, opioid medication as noted below.  Patient advised to give at least 6 to 8 hours in between dosages of hydrocodone/temazepam. Per review of clincalkey Concomitant use of opiate agonists with benzodiazepines may cause respiratory depression, hypotension, profound sedation, and death. Limit the use of opiate pain medications with benzodiazepines to only patients for whom alternative treatment options are inadequate. If concurrent use is necessary, use the lowest effective doses and minimum treatment durations needed to achieve the desired clinical effect. If hydrocodone is initiated in a patient taking a benzodiazepine, reduce initial dosage and titrate to clinical response; for  hydrocodone extended-release products, initiate hydrocodone at 20% to 30% of the usual dosage. If a benzodiazepine is prescribed for an indication other than epilepsy in a patient taking an opiate agonist, use a lower initial dose of the benzodiazepine and titrate to clinical response. Educate patients about the risks and symptoms of respiratory depression and sedation. Avoid opiate cough medications in patients  taking benzodiazepines.   Noncompliance with treatment plan-unstable As noted above patient hesitant, declines medication management of her mood, racing thoughts or anxiety.  Patient noncompliant with psychotherapy referral although this was discussed several times in the past as well.  Patient currently going through major stressors in her life hence recommended CBT again. Discussed with patient CBT I -for her chronic insomnia which is currently worsening.  Discussed with patient that she will benefit from this.  Also discussed with patient that if she is not making any progress in spite of medication changes for her sleep problems she may benefit from referral to a sleep specialty clinic.   Reviewed TSH - 07/15/2021- wnl Thyroid Stimulating Hormone (TSH) 0.450-5.330 uIU/ml uIU/mL 1.427   Reviewed drug compliance analysis - 11/30/21- as expected.  Follow-up in clinic in 2 weeks in person.  I have spent atleast 40 minutes face to face with patient today which includes the time spent for preparing to see the patient ( e.g., review of test, records ), obtaining and to review and separately obtained history , ordering medications and test ,psychoeducation and supportive psychotherapy and care coordination,as well as documenting clinical information in electronic health record,interpreting and communication of test results   This note was generated in part or whole with voice recognition software. Voice recognition is usually quite accurate but there are transcription errors that can and very often do occur. I apologize for any typographical errors that were not detected and corrected.     Ursula Alert, MD 02/26/2022, 1:06 PM

## 2022-02-25 NOTE — Telephone Encounter (Signed)
Will forward this to Dr. Shea Evans

## 2022-02-25 NOTE — Telephone Encounter (Signed)
Already discussed in session today , patient aware.

## 2022-02-25 NOTE — Patient Instructions (Signed)
Concomitant use of opiate agonists with benzodiazepines may cause respiratory depression, hypotension, profound sedation, and death. Limit the use of opiate pain medications with benzodiazepines .  Temazepam Capsules What is this medication? TEMAZEPAM (te MAZ e pam) treats insomnia. It helps you go to sleep faster and stay asleep through the night. It is often used for a short period of time. It belongs to a group of medications called benzodiazepines. This medicine may be used for other purposes; ask your health care provider or pharmacist if you have questions. COMMON BRAND NAME(S): Restoril What should I tell my care team before I take this medication? They need to know if you have any of these conditions: Kidney disease Liver disease Lung or breathing disease Mental health condition Myasthenia gravis Parkinson's disease Porphyria Seizures or a history of seizures Substance use disorder Suicidal thoughts, plans, or attempt by you or a family member An unusual or allergic reaction to temazepam, other benzodiazepines, other medications, foods, dyes, or preservatives Pregnant or trying to get pregnant Breast-feeding How should I use this medication? Take this medication by mouth. It is only for use at bedtime. Follow the directions on the prescription label. Swallow the tablets or capsules with a drink of water. If it upsets your stomach, take it with food or milk. Do not take your medication more often than directed. Do not stop taking except on the advice of your care team. A special MedGuide will be given to you by the pharmacist with each prescription and refill. Be sure to read this information carefully each time. Talk to your care team about the use of this medication in children. Special care may be needed. Overdosage: If you think you have taken too much of this medicine contact a poison control center or emergency room at once. NOTE: This medicine is only for you. Do not share  this medicine with others. What if I miss a dose? If you miss a dose, take it as soon as you can. If it is almost time for your next dose, take only that dose. Do not take double or extra doses. What may interact with this medication? Do not take this medication with any of the following: Opioid medications for cough Sodium oxybate This medication may also interact with the following: Alcohol Antihistamines for allergy, cough and cold Certain medications for anxiety or sleep Certain medications for depression, such as amitriptyline, fluoxetine, sertraline Certain medications for seizures, such as phenobarbital, primidone General anesthetics, such as lidocaine, pramoxine, tetracaine Medications that relax muscles for surgery Opioid medications for pain Phenothiazines, such as chlorpromazine, mesoridazine, prochlorperazine, thioridazine This list may not describe all possible interactions. Give your health care provider a list of all the medicines, herbs, non-prescription drugs, or dietary supplements you use. Also tell them if you smoke, drink alcohol, or use illegal drugs. Some items may interact with your medicine. What should I watch for while using this medication? Visit your care team for regular checks on your progress. Tell your care team if your symptoms do not start to get better or if they get worse. Do not stop taking this medication except on your care team's advice. You may develop a severe reaction. Your care team will tell you how much medication to take. Long term use of this medication may cause your brain and body to depend on it. This can happen even when used as directed by your care team. You and your care team will work together to determine how long you will need  to take this medication. Plan to go to bed and stay in bed for a full night (7 to 8 hours) after you take this medication. You may still be drowsy the morning after taking this medication. This medication may  affect your coordination, reaction time, or judgment. Do not drive or operate machinery until you know how this medication affects you. Sit up or stand slowly to reduce the risk of dizzy or fainting spells. Drinking alcohol with this medication can increase the risk of these side effects. If you are taking another medication that also causes drowsiness, you may have more side effects. Give your care team a list of all medications you use. Your care team will tell you how much medication to take. Do not take more medication than directed. Get emergency help right away if you have problems breathing or unusual sleepiness. You may do unusual sleep behaviors or activities you do not remember the day after taking this medication. Activities include driving, making or eating food, talking on the phone, sexual activity, or sleep walking. Stop taking this medication and call your care team right away if you find out you have done activities like this. After you stop taking this medication, you may have trouble falling asleep. This is called rebound insomnia. This problem usually goes away on its own after 1 or 2 nights. If you or your family notice any changes in your behavior, such as new or worsening depression, thoughts of harming yourself, anxiety, other unusual or disturbing thoughts, or memory loss, call your care team right away. Talk to your care team if you wish to become pregnant or think you might be pregnant. This medication can cause serious birth defects. Talk to your care team before breastfeeding. Changes to your treatment plan may be needed. What side effects may I notice from receiving this medication? Side effects that you should report to your care team as soon as possible: Allergic reactions--skin rash, itching, hives, swelling of the face, lips, tongue, or throat CNS depression--slow or shallow breathing, shortness of breath, feeling faint, dizziness, confusion, trouble staying awake Mood and  behavior changes--anxiety, nervousness, confusion, hallucinations, irritability, hostility, thoughts of suicide or self-harm, worsening mood, feelings of depression Unusual sleep behaviors or activities you do not remember such as driving, eating, or sexual activity Side effects that usually do not require medical attention (report to your care team if they continue or are bothersome): Dizziness Drowsiness the day after use Headache Nausea This list may not describe all possible side effects. Call your doctor for medical advice about side effects. You may report side effects to FDA at 1-800-FDA-1088. Where should I keep my medication? Keep out of the reach of children and pets. This medication can be abused. Keep your medication in a safe place to protect it from theft. Do not share this medication with anyone. Selling or giving away this medication is dangerous and against the law. This medication may cause accidental overdose and death if taken by other adults, children, or pets. Mix any unused medication with a substance like cat litter or coffee grounds. Then throw the medication away in a sealed container like a sealed bag or a coffee can with a lid. Do not use the medication after the expiration date. Store at room temperature below 30 degrees C (86 degrees F). Protect from light. Keep container tightly closed. NOTE: This sheet is a summary. It may not cover all possible information. If you have questions about this medicine, talk to your  doctor, pharmacist, or health care provider.  2023 Elsevier/Gold Standard (2020-10-02 00:00:00)

## 2022-03-01 ENCOUNTER — Telehealth: Payer: Self-pay

## 2022-03-01 NOTE — Telephone Encounter (Signed)
on the [email or web address] website a notice that a prior auth was needed for the temazepam. prior Josem Kaufmann was submitted and approved until 02-15-23

## 2022-03-08 ENCOUNTER — Other Ambulatory Visit (INDEPENDENT_AMBULATORY_CARE_PROVIDER_SITE_OTHER): Payer: Medicare Other

## 2022-03-08 ENCOUNTER — Other Ambulatory Visit: Payer: Self-pay | Admitting: Internal Medicine

## 2022-03-08 DIAGNOSIS — R7989 Other specified abnormal findings of blood chemistry: Secondary | ICD-10-CM

## 2022-03-08 DIAGNOSIS — Z9884 Bariatric surgery status: Secondary | ICD-10-CM

## 2022-03-08 DIAGNOSIS — E559 Vitamin D deficiency, unspecified: Secondary | ICD-10-CM

## 2022-03-08 LAB — VITAMIN D 25 HYDROXY (VIT D DEFICIENCY, FRACTURES): VITD: 10.03 ng/mL — ABNORMAL LOW (ref 30.00–100.00)

## 2022-03-08 MED ORDER — VITAMIN D (ERGOCALCIFEROL) 1.25 MG (50000 UNIT) PO CAPS: 50000.0000 [IU] | ORAL_CAPSULE | Freq: Every day | ORAL | 0 refills | Status: DC

## 2022-03-09 ENCOUNTER — Telehealth: Payer: Self-pay | Admitting: Psychiatry

## 2022-03-09 NOTE — Telephone Encounter (Signed)
Attempted to contact patient to discuss her concern about the temazepam.  Patient picked up the phone couple of times when writer attempted to contact her however kept saying ' hello ' as though she could not hear.  The third time when writer contacted it went into a voicemail and there was no space to leave a voicemail.  And SMS notification with this number has been sent to her to call us back.  I will also send a MyChart message requesting patient to call us back to discuss further.

## 2022-03-10 ENCOUNTER — Ambulatory Visit: Payer: Medicare Other | Admitting: Internal Medicine

## 2022-03-10 ENCOUNTER — Telehealth: Payer: Self-pay | Admitting: Psychiatry

## 2022-03-10 ENCOUNTER — Other Ambulatory Visit: Payer: Self-pay | Admitting: Psychiatry

## 2022-03-10 DIAGNOSIS — F5101 Primary insomnia: Secondary | ICD-10-CM

## 2022-03-10 MED ORDER — ZOLPIDEM TARTRATE ER 12.5 MG PO TBCR: 12.5000 mg | EXTENDED_RELEASE_TABLET | Freq: Every day | ORAL | 0 refills | Status: DC

## 2022-03-10 NOTE — Telephone Encounter (Signed)
Return call to patient after a MyChart message was received.  Patient reports temazepam is not effective at this dose.  She continues to be on the Norco hence I do not recommend going up on the dosage at this time.  She is planning to talk to her pain provider about non opioid options for her pain.  However until then she would like to go back on the zolpidem which she was previously taking.  Will discontinue temazepam for lack of benefit.  Will restart zolpidem ER 12.5 mg p.o. daily at bedtime.

## 2022-03-18 ENCOUNTER — Encounter: Payer: Self-pay | Admitting: Oncology

## 2022-03-19 ENCOUNTER — Encounter: Payer: Self-pay | Admitting: Psychiatry

## 2022-03-19 ENCOUNTER — Ambulatory Visit (INDEPENDENT_AMBULATORY_CARE_PROVIDER_SITE_OTHER): Payer: Medicare Other | Admitting: Psychiatry

## 2022-03-19 VITALS — BP 116/77 | HR 81 | Temp 98.0°F | Ht 67.75 in | Wt 140.0 lb

## 2022-03-19 DIAGNOSIS — F3341 Major depressive disorder, recurrent, in partial remission: Secondary | ICD-10-CM

## 2022-03-19 DIAGNOSIS — F4310 Post-traumatic stress disorder, unspecified: Secondary | ICD-10-CM | POA: Diagnosis not present

## 2022-03-19 DIAGNOSIS — F410 Panic disorder [episodic paroxysmal anxiety] without agoraphobia: Secondary | ICD-10-CM

## 2022-03-19 DIAGNOSIS — F5101 Primary insomnia: Secondary | ICD-10-CM | POA: Diagnosis not present

## 2022-03-19 MED ORDER — BUPROPION HCL ER (XL) 150 MG PO TB24: 300.0000 mg | ORAL_TABLET | Freq: Every day | ORAL | 0 refills | Status: DC

## 2022-03-19 MED ORDER — VILAZODONE HCL 40 MG PO TABS: 40.0000 mg | ORAL_TABLET | Freq: Every day | ORAL | 0 refills | Status: DC

## 2022-03-19 MED ORDER — ZOLPIDEM TARTRATE ER 12.5 MG PO TBCR: 12.5000 mg | EXTENDED_RELEASE_TABLET | Freq: Every day | ORAL | 1 refills | Status: DC

## 2022-03-19 MED ORDER — HYDROXYZINE HCL 25 MG PO TABS: ORAL_TABLET | ORAL | 2 refills | Status: DC

## 2022-03-19 MED ORDER — LAMOTRIGINE 200 MG PO TABS: ORAL_TABLET | ORAL | 0 refills | Status: DC

## 2022-03-19 NOTE — Patient Instructions (Signed)
Please start taking Wellbutrin XL 150 mg and 300 mg on alternate days for 2 weeks and reduce to wellbutrin XL 150 mg daily after 2 weeks .

## 2022-03-19 NOTE — Progress Notes (Signed)
[facility] MD OP Progress Note  03/19/2022 1:27 PM INA SCRIVENS  MRN:  [id number]  Chief Complaint:  Chief Complaint  Patient presents with   Follow-up   Medication Refill   Anxiety   Depression   Insomnia   HPI: [name] is a 47 year old Caucasian female, divorced, lives in [place], has a history of MDD, panic disorder, PTSD, primary insomnia, history of seizure disorder, chronic pain, history of gastric bypass ( 2009), small bowel obstruction was evaluated in office today.  Patient today reports she continues to have situational stresses including her father who has terminal cancer, supporting her mother as well as her ex-husband who has MS.  Patient however reports she has been coping better than before.  She reports sleep has improved since she changed her dinner schedule to a bit earlier than what she was doing recently.  Since then sleep slightly better.  She continues to take the zolpidem, switched back to zolpidem recently after she contacted the office.  She stopped taking the temazepam which was not effective.  Patient reports she is interested in backing off the Wellbutrin as discussed last visit to see if that will also help with her sleep.  Patient denies any suicidality, homicidality or perceptual disturbances.  Currently compliant on all her other medications.  Denies side effects.  Reports she does have difficulty gaining weight as well as is deficient in vitamin D, vitamin B12, agreeable to reach out to her primary care provider for recommendations.  She is also currently working with her pain providers to get good pain management and is interested in coming off of the opioids.  Patient denies any other concerns today.  Visit Diagnosis:    ICD-10-CM   1. Recurrent major depressive disorder, in partial remission (HCC)  F33.41 lamoTRIgine (LAMICTAL) 200 MG tablet    2. Panic disorder  F41.0 Vilazodone HCl (VIIBRYD) 40 MG TABS    hydrOXYzine (ATARAX) 25 MG  tablet    3. PTSD (post-traumatic stress disorder)  F43.10 buPROPion (WELLBUTRIN XL) 150 MG 24 hr tablet    Vilazodone HCl (VIIBRYD) 40 MG TABS    4. Primary insomnia  F51.01 zolpidem (AMBIEN CR) 12.5 MG CR tablet      Past Psychiatric History: Reviewed past psychiatric history from progress note on 05/09/2017.  Past Medical History:  Past Medical History:  Diagnosis Date   Abnormal ECG    Anemia    Anxiety    Arthritis    Asthma    B12 deficiency    Cardiomyopathy ([place])    Chronic abdominal pain    Complication of anesthesia    difficulty to get sedated during endoscopy   COVID-19 01/18/2019   DDD (degenerative disc disease), lumbar    Depression    Diabetes mellitus without complication ([place])    ONLY gestational diabetes, does not have chronic diabetes   Dysrhythmia    GERD (gastroesophageal reflux disease)    Iron deficiency    Migraine headache    Neuropathy    Obesity    gastric bypass 2009   Ovarian cyst    Pneumonia    within past five years   PTSD (post-traumatic stress disorder)    PTSD (post-traumatic stress disorder)    Rh negative status during pregnancy    Seizures ([place])    post-gestational   Small bowel obstruction ([place])     Past Surgical History:  Procedure Laterality Date   ABDOMINAL HYSTERECTOMY     BILATERAL SALPINGECTOMY Bilateral 09/29/2015  Procedure: BILATERAL SALPINGECTOMY;  Surgeon: Benjaman Kindler, MD;  Location: [facility];  Service: Gynecology;  Laterality: Bilateral;   bowel obstruction  2011   CHOLECYSTECTOMY     COLONOSCOPY WITH PROPOFOL N/A 01/24/2018   Procedure: COLONOSCOPY WITH PROPOFOL;  Surgeon: Toledo, Benay Pike, MD;  Location: [facility];  Service: Gastroenterology;  Laterality: N/A;   DILATION AND CURETTAGE OF UTERUS     ESOPHAGOGASTRODUODENOSCOPY     ESOPHAGOGASTRODUODENOSCOPY (EGD) WITH PROPOFOL N/A 01/24/2018   Procedure: ESOPHAGOGASTRODUODENOSCOPY (EGD) WITH PROPOFOL;  Surgeon: Toledo, Benay Pike, MD;  Location:  [facility];  Service: Gastroenterology;  Laterality: N/A;   GASTRIC BYPASS  2010   gi bleed  2009   Surgery-was in ICU for three weeks   HERNIA REPAIR     OVARIAN CYST REMOVAL Left 09/29/2015   Procedure: OVARIAN CYSTECTOMY;  Surgeon: Benjaman Kindler, MD;  Location: [facility];  Service: Gynecology;  Laterality: Left;   ROUX-EN-Y PROCEDURE     VAGINAL HYSTERECTOMY N/A 09/29/2015   Procedure: HYSTERECTOMY VAGINAL;  Surgeon: Benjaman Kindler, MD;  Location: [facility];  Service: Gynecology;  Laterality: N/A;    Family Psychiatric History: Reviewed family psychiatric history from progress note on 05/09/2017.  Family History:  Family History  Problem Relation Age of Onset   Arthritis/Rheumatoid Mother    Heart block Mother    Clotting disorder Mother    Osteoporosis Mother    Heart attack Mother    Anxiety disorder Mother    Depression Mother    Colon polyps Mother    Stomach cancer Father 84   Heart disease Father    Heart attack Father    Depression Sister    Anxiety disorder Sister    Colon polyps Sister    Colon cancer Neg Hx    Esophageal cancer Neg Hx    Pancreatic cancer Neg Hx     Social History: Reviewed social history from progress note on 05/09/2017. Social History   Socioeconomic History   Marital status: Single    Spouse name: Not on file   Number of children: Not on file   Years of education: Not on file   Highest education level: Not on file  Occupational History   Not on file  Tobacco Use   Smoking status: Never   Smokeless tobacco: Never  Vaping Use   Vaping Use: Never used  Substance and Sexual Activity   Alcohol use: No    Alcohol/week: 0.0 standard drinks of alcohol   Drug use: No   Sexual activity: Yes    Partners: Male    Birth control/protection: Surgical  Other Topics Concern   Not on file  Social History Narrative   Lives with mother, daughter, son, and fiance. Pets, dogs, in home.   Social Determinants of Health   Financial Resource  Strain: Not on file  Food Insecurity: Not on file  Transportation Needs: Not on file  Physical Activity: Not on file  Stress: Not on file  Social Connections: Not on file    Allergies:  Allergies  Allergen Reactions   Amoxicillin Anaphylaxis, Hives, Shortness Of Breath, Swelling and Rash    Has patient had a PCN reaction causing immediate rash, facial/tongue/throat swelling, SOB or lightheadedness with hypotension: Yes Has patient had a PCN reaction causing severe rash involving mucus membranes or skin necrosis: Yes Has patient had a PCN reaction that required hospitalization No Has patient had a PCN reaction occurring within the last 10 years: No If all of the above answers are "NO",  then may proceed with Cephalosporin use. Other reaction(s): ANAPHYLAXIS Other reaction(s): ANAPHYLAX   Penicillins Anaphylaxis, Swelling, Rash, Shortness Of Breath and Hives    Has patient had a PCN reaction causing immediate rash, facial/tongue/throat swelling, SOB or lightheadedness with hypotension: Yes Has patient had a PCN reaction causing severe rash involving mucus membranes or skin necrosis: Yes Has patient had a PCN reaction that required hospitalization No Has patient had a PCN reaction occurring within the last 10 years: No If all of the above answers are "NO", then may proceed with Cephalosporin use.  Other reaction(s): ANAPHYLAXIS Other reaction(s): ANAPHYLAXIS Has patient had a PCN reaction causing immediate rash, facial/tongue/throat swelling, SOB or lightheadedness with hypotension: Yes Has patient had a PCN reaction causing severe rash involving mucus membranes or skin necrosis: Yes Has patient had a PCN reaction that required hospitalization No Has patient had a PCN reaction occurring within the last 10 years: No If all of the above answers are "NO", then may proceed with Cephalosporin use. Other reaction(s): ANAPHYLAXIS Other reaction(s): ANAPHYLAX Has patient had a PCN reaction  causing immediate rash, facial/tongue/throat swelling, SOB or lightheadedness with hypotension: Yes Has patient had a PCN reaction causing severe rash involving mucus membranes or skin necrosis: Yes Has patient had a PCN reaction that required hospitalization No Has patient had a PCN reaction occurring within the last 10 years: No If all of the above answers are "NO", then may proceed with Cephalosporin use. Other reaction(s): ANAPHYLAXIS Other reaction(s): ANAPHYLAXIS    Morphine Other (See Comments)    Muscle spasms   Sulfa Antibiotics Nausea Only    Other reaction(s): VOMITING Other reaction(s): VOMITING Other reaction(s): VOMITING    Metabolic Disorder Labs: Lab Results  Component Value Date   HGBA1C 5.5 12/11/2019   MPG 111 12/11/2019   Lab Results  Component Value Date   PROLACTIN 12.1 12/11/2019   Lab Results  Component Value Date   CHOL 162 12/11/2019   TRIG 51 12/11/2019   HDL 87 12/11/2019   CHOLHDL 1.9 12/11/2019   VLDL 10 12/11/2019   LDLCALC 65 12/11/2019   Lab Results  Component Value Date   TSH 1.886 12/11/2019   TSH  05/13/2009    3.163 (NOTE) Please note change in reference ranges for ages 4W to 67Y. Test methodology is 3rd generation TSH    Therapeutic Level Labs: No results found for: "LITHIUM" No results found for: "VALPROATE" No results found for: "CBMZ"  Current Medications: Current Outpatient Medications  Medication Sig Dispense Refill   acetaminophen (TYLENOL) 325 MG tablet Take 650 mg by mouth every 4 (four) hours as needed for moderate pain.      AIMOVIG 140 MG/ML SOAJ SMARTSIG:140 Milligram(s) SUB-Q Every 4 Weeks     cyanocobalamin (,VITAMIN B-12,) 1000 MCG/ML injection Inject 1,000 mcg into the muscle every 30 (thirty) days.      diphenoxylate-atropine (LOMOTIL) 2.5-0.025 MG tablet Take 1 tablet by mouth as needed. 60 tablet 0   HYDROcodone-acetaminophen (NORCO) 7.5-325 MG tablet Take 1 tablet by mouth every 6 (six) hours as needed  for moderate pain.     Multiple Vitamins-Minerals (HM MULTIVITAMIN ADULT GUMMY) CHEW Chew 2 tablets by mouth daily.     pantoprazole (PROTONIX) 40 MG tablet Take 1 tablet (40 mg total) by mouth 2 (two) times daily. 90 tablet 3   SUMAtriptan (IMITREX) 100 MG tablet Take 100 mg by mouth every 2 (two) hours as needed for migraine or headache.      topiramate (TOPAMAX) 50  MG tablet Take 100 mg by mouth 2 (two) times daily.     Vitamin D, Ergocalciferol, (DRISDOL) 1.25 MG (50000 UNIT) CAPS capsule Take 1 capsule (50,000 Units total) by mouth daily. TAKE 1 CAPSULE BY MOUTH DAILY FOR 14 DAYS THEN TAKE 1 CAPSULE BY MOUTH 1 TIME WEEKLY 60 capsule 0   buPROPion (WELLBUTRIN XL) 150 MG 24 hr tablet Take 2 tablets (300 mg total) by mouth daily. 60 tablet 0   hydrOXYzine (ATARAX) 25 MG tablet TAKE 1/2 TO 1 TABLET(12.5 TO 25 MG) BY MOUTH TWICE DAILY AS NEEDED FOR ANXIETY 60 tablet 2   lamoTRIgine (LAMICTAL) 200 MG tablet TAKE 1/2 TABLET(100 MG) BY MOUTH TWICE DAILY 90 tablet 0   Vilazodone HCl (VIIBRYD) 40 MG TABS Take 1 tablet (40 mg total) by mouth daily. 90 tablet 0   [START ON 04/08/2022] zolpidem (AMBIEN CR) 12.5 MG CR tablet Take 1 tablet (12.5 mg total) by mouth at bedtime. 30 tablet 1   No current facility-administered medications for this visit.     Musculoskeletal: Strength & Muscle Tone: within normal limits Gait & Station: normal Patient leans: N/A  Psychiatric Specialty Exam: Review of Systems  Musculoskeletal:  Positive for back pain.  Psychiatric/Behavioral:  Positive for sleep disturbance. The patient is nervous/anxious.   All other systems reviewed and are negative.   Blood pressure 116/77, pulse 81, temperature 98 F (36.7 C), temperature source Oral, height 5' 7.75" (1.721 m), weight 140 lb (63.5 kg), SpO2 98 %.Body mass index is 21.44 kg/m.  General Appearance: Casual  Eye Contact:  Good  Speech:  Clear and Coherent  Volume:  Normal  Mood:  Anxious coping better  Affect:   Congruent  Thought Process:  Goal Directed and Descriptions of Associations: Intact  Orientation:  Full (Time, Place, and Person)  Thought Content: Logical   Suicidal Thoughts:  No  Homicidal Thoughts:  No  Memory:  Immediate;   Fair Recent;   Fair Remote;   Fair  Judgement:  Fair  Insight:  Fair  Psychomotor Activity:  Normal  Concentration:  Concentration: Fair and Attention Span: Fair  Recall:  [employer] of Knowledge: Fair  Language: Fair  Akathisia:  No  Handed:  Right  AIMS (if indicated): not done  Assets:  Communication Skills Desire for Improvement Housing Social Support  ADL's:  Intact  Cognition: WNL  Sleep:   Improving   Screenings: AIMS    Flowsheet Row Video Visit from 06/16/2021 in [facility] Total Score 0      [place] Office Visit from 03/19/2022 in [place] Office Visit from 02/25/2022 in [place] Video Visit from 06/16/2021 in [place]  Total GAD-7 Score '3 7 7      '$ PHQ2-9    [place] Office Visit from 03/19/2022 in [place] Office Visit from 02/25/2022 in [place] Video Visit from 01/19/2022 in [place] Video Visit from 10/06/2021 in [place] Video Visit from 06/16/2021 in [place]  PHQ-2 Total Score 2 2 0 1 1  PHQ-9 Total Score 4 6 -- 9 [place] Office Visit from 03/19/2022 in [place] Office Visit from 02/25/2022 in [place]  Video Visit from 01/19/2022 in [facility]  C-SSRS RISK CATEGORY No Risk No Risk No Risk         Assessment and Plan: RICKIE GUTIERRES is a 47 year old Caucasian female who has a history of depression, anxiety, insomnia was evaluated in office today.  Patient with chronic pain, vitamin D, vitamin B12 deficiency multiple situational stresses, discussed plan as noted below to address her mood and sleep.  Plan MDD in partial remission Patient advised to start taking Wellbutrin XL 300 mg and 150 mg on alternate days for the next 15 days and to reduce the Wellbutrin XL to 150 mg p.o. daily after that.  This is to help with her sleep problems.  Unknown if Wellbutrin contributing to same. Continue Viibryd 40 mg p.o. daily for now. Patient generally resistant to make any medication changes.  PTSD-stable Viibryd 40 mg p.o. daily Hydroxyzine 25 mg p.o. nightly as needed  Panic disorder-stable Hydroxyzine 25 mg p.o. nightly as needed  Insomnia-improving She will need sufficient pain management Continue zolpidem extended release 12.5 mg p.o. nightly Reviewed [place] PMP AWARxE Reduce Wellbutrin dosage as noted above. May benefit from CBT I/sleep study  Patient was referred for CBT in the past-noncompliant  Will consider referral for sleep study in the future as needed.  Patient advised to follow up with her primary care provider regarding vitamin B12, vitamin D deficiency.  Provided education about genetic testing-patient to let writer know if she is interested.  Follow-up in clinic in 6 weeks or sooner if needed.    This note was generated in part or whole with voice recognition software. Voice recognition is usually quite accurate but there are transcription errors that can and very often do occur. I apologize for any typographical errors that were not detected and corrected.     Ursula Alert, MD 03/19/2022, 1:27 PM

## 2022-03-31 ENCOUNTER — Encounter: Payer: Self-pay | Admitting: Pain Medicine

## 2022-03-31 ENCOUNTER — Ambulatory Visit: Payer: Medicare Other | Attending: Pain Medicine | Admitting: Pain Medicine

## 2022-03-31 VITALS — BP 119/80 | HR 77 | Temp 97.4°F | Ht 67.0 in | Wt 140.0 lb

## 2022-03-31 DIAGNOSIS — M542 Cervicalgia: Secondary | ICD-10-CM | POA: Diagnosis present

## 2022-03-31 DIAGNOSIS — R937 Abnormal findings on diagnostic imaging of other parts of musculoskeletal system: Secondary | ICD-10-CM | POA: Insufficient documentation

## 2022-03-31 DIAGNOSIS — G4486 Cervicogenic headache: Secondary | ICD-10-CM | POA: Insufficient documentation

## 2022-03-31 DIAGNOSIS — M25512 Pain in left shoulder: Secondary | ICD-10-CM | POA: Diagnosis present

## 2022-03-31 DIAGNOSIS — G8929 Other chronic pain: Secondary | ICD-10-CM | POA: Diagnosis present

## 2022-03-31 DIAGNOSIS — M5412 Radiculopathy, cervical region: Secondary | ICD-10-CM | POA: Diagnosis present

## 2022-03-31 NOTE — Patient Instructions (Signed)

## 2022-03-31 NOTE — Progress Notes (Signed)
Safety precautions to be maintained throughout the outpatient stay will include: orient to surroundings, keep bed in low position, maintain call bell within reach at all times, provide assistance with transfer out of bed and ambulation.  

## 2022-03-31 NOTE — Progress Notes (Signed)
PROVIDER NOTE: Information contained herein reflects review and annotations entered in association with encounter. Interpretation of such information and data should be left to medically-trained personnel. Information provided to patient can be located elsewhere in the medical record under "Patient Instructions". Document created using STT-dictation technology, any transcriptional errors that may result from process are unintentional.    Patient: [name]  Service Category: E/M  Provider: Gaspar Cola, MD  DOB: [birth year]  DOS: 03/31/2022  Referring Provider: Leonard Downing, *  MRN: [id number]  Specialty: Interventional Pain Management  PCP: Leonard Downing, MD  Type: Established Patient  Setting: Ambulatory outpatient    Location: Office  Delivery: Face-to-face     HPI  [name], a 47 y.o. year old female, is here today because of her Chronic left shoulder pain [M25.512, G89.29]. [name] primary complain today is Shoulder Pain (left) Last encounter: My last encounter with her was on 01/25/2022. Pertinent problems: [name] has Abnormal ECG; Achilles bursitis; Achilles tendinitis; Chronic pain associated with significant psychosocial dysfunction; DDD (degenerative disc disease), lumbar; Disorder of sacrum; DDD (degenerative disc disease), lumbosacral; Lumbar facet arthropathy; Chronic low back pain (2ry area of Pain) (Right) w/ sciatica (Right); Lumbar and sacral osteoarthritis; Abdominal pain, right upper quadrant; Arthralgia, sacroiliac; Abdominal pain, generalized; Neuropathy; Cervical radiculitis; History of idiopathic intracranial hypertension; Abnormal MRI, lumbar spine (01/28/2017 & 12/11/2021); History of bariatric surgery; Idiopathic small fiber peripheral neuropathy (1ry area of Pain) (feet> hands); Chronic feet pain (Bilateral); Chronic hand pain (Bilateral); Chronic lower extremity pain (3ry area of Pain) (Right) (intermittent); Chronic  neck pain (4th area of Pain) (Midline) (Left); Cervicalgia; Chronic shoulder pain (5th area of Pain) (Left); Complications of bariatric procedures; Postoperative intestinal malabsorption; Abnormal MRI, cervical spine (09/29/2021) ([facility]); Cervicogenic headache (Bilateral); and Occipital neuralgia (Bilateral) on their pertinent problem list. Pain Assessment: Severity of Chronic pain is reported as a 8 /10. Location: Shoulder Left/Denies. Onset: More than a month ago. Quality: Throbbing, Aching, Constant, Discomfort. Timing: Constant. Modifying factor(s): nothing. Vitals:  height is 5' 7"$  (1.702 m) and weight is 140 lb (63.5 kg). Her temperature is 97.4 F (36.3 C) (abnormal). Her blood pressure is 119/80 and her pulse is 77. Her oxygen saturation is 100%.  BMI: Estimated body mass index is 21.93 kg/m as calculated from the following:   Height as of this encounter: 5' 7"$  (1.702 m).   Weight as of this encounter: 140 lb (63.5 kg).  Reason for encounter: evaluation of worsening, or previously known (established) problem.  The patient requested to come in secondary to increase in her left shoulder pain.  She indicates that in the past she has had injections into the left shoulder and also the neck at [facility].  She refers having low back pain that has previously been treated at [facility] with injection therapies.  The last set of injections was done approximately 6 to 8 months ago by Dr. Laroy Apple ([facility]).  Previously we had the patient's sign a release form and we requested copies of the procedure notes from [facility] but we never received anything back.  Today we are having the patient sign another release form and we will be sending another request for those procedure notes.  I have also encourage the patient to call and see if she can put some pressure on them to send those notes.  The patient has decreased range of motion of the left shoulder as well as  decreased range of motion of  the cervical spine with the shoulder pain being multifactorial.  Some of the pain is coming from the shoulder joint itself and some coming from the cervical region.  Since she describes the shoulder and neck injections done at [facility] to have worked, I will be bringing her back for a left intra-articular glenohumeral and acromioclavicular joint injection #1.  If she continues to have pain in that shoulder after the intra-articular injection, I plan to then bring her back for a left-sided cervical epidural steroid injection, as previously done by [facility].  Again, the patient indicates that that combination worked extremely well.  The plan was shared with the patient who understood and accepted.  Pharmacotherapy Assessment  Analgesic: No chronic opioid analgesics therapy prescribed by our practice. Hydrocodone/APAP 7.5/325, 1 tab p.o. 4 times daily (# 120) prescribed by Ursula Alert, MD (last filled on 12/12/2021) MME/day: 30 mg/day   Monitoring: [place] PMP: PDMP reviewed during this encounter.       Pharmacotherapy: No side-effects or adverse reactions reported. Compliance: No problems identified. Effectiveness: Clinically acceptable.  Chauncey Fischer, RN  03/31/2022  8:42 AM  Sign when Signing Visit Safety precautions to be maintained throughout the outpatient stay will include: orient to surroundings, keep bed in low position, maintain call bell within reach at all times, provide assistance with transfer out of bed and ambulation.     No results found for: "CBDTHCR" No results found for: "D8THCCBX" No results found for: "D9THCCBX"  UDS:  Summary  Date Value Ref Range Status  11/30/2021 Note  Final    Comment:    ==================================================================== Compliance Drug Analysis, Ur ==================================================================== Test                             Result       Flag       Units  Drug Present    Topiramate                     PRESENT   Lamotrigine                    PRESENT   Zolpidem                       PRESENT   Zolpidem Acid                  PRESENT    Zolpidem acid is an expected metabolite of zolpidem.    Bupropion                      PRESENT   Hydroxybupropion               PRESENT    Hydroxybupropion is an expected metabolite of bupropion.    Vilazodone                     PRESENT   Acetaminophen                  PRESENT ==================================================================== Test                      Result    Flag   Units      Ref Range   Creatinine              36  mg/dL      >=20 ==================================================================== Declared Medications:  Medication list was not provided. ==================================================================== For clinical consultation, please call [phone]. ====================================================================       ROS  Constitutional: Denies any fever or chills Gastrointestinal: No reported hemesis, hematochezia, vomiting, or acute GI distress Musculoskeletal: Denies any acute onset joint swelling, redness, loss of ROM, or weakness Neurological: No reported episodes of acute onset apraxia, aphasia, dysarthria, agnosia, amnesia, paralysis, loss of coordination, or loss of consciousness  Medication Review  Erenumab-aooe, HM Multivitamin Adult Gummy, HYDROcodone-acetaminophen, SUMAtriptan, Vilazodone HCl, Vitamin D (Ergocalciferol), acetaminophen, buPROPion, cyanocobalamin, diphenoxylate-atropine, hydrOXYzine, lamoTRIgine, pantoprazole, topiramate, and zolpidem  History Review  Allergy: Ms. Canez is allergic to amoxicillin, penicillins, morphine, and sulfa antibiotics. Drug: Ms. Kobashigawa  reports no history of drug use. Alcohol:  reports no history of alcohol use. Tobacco:  reports that she has never smoked. She has never used smokeless  tobacco. Social: Ms. Stream  reports that she has never smoked. She has never used smokeless tobacco. She reports that she does not drink alcohol and does not use drugs. Medical:  has a past medical history of Abnormal ECG, Anemia, Anxiety, Arthritis, Asthma, B12 deficiency, Cardiomyopathy ([place]), Chronic abdominal pain, Complication of anesthesia, COVID-19 (01/18/2019), DDD (degenerative disc disease), lumbar, Depression, Diabetes mellitus without complication ([place]), Dysrhythmia, GERD (gastroesophageal reflux disease), Iron deficiency, Migraine headache, Neuropathy, Obesity, Ovarian cyst, Pneumonia, PTSD (post-traumatic stress disorder), PTSD (post-traumatic stress disorder), Rh negative status during pregnancy, Seizures ([place]), and Small bowel obstruction ([place]). Surgical: Ms. Schoendorf  has a past surgical history that includes Gastric bypass (2010); Cholecystectomy; Hernia repair; gi bleed (2009); bowel obstruction (2011); Vaginal hysterectomy (N/A, 09/29/2015); Bilateral salpingectomy (Bilateral, 09/29/2015); Ovarian cyst removal (Left, 09/29/2015); Abdominal hysterectomy; Dilation and curettage of uterus; Esophagogastroduodenoscopy; Roux-en-y procedure; Esophagogastroduodenoscopy (egd) with propofol (N/A, 01/24/2018); and Colonoscopy with propofol (N/A, 01/24/2018). Family: family history includes Anxiety disorder in her mother and sister; Arthritis/Rheumatoid in her mother; Clotting disorder in her mother; Colon polyps in her mother and sister; Depression in her mother and sister; Heart attack in her father and mother; Heart block in her mother; Heart disease in her father; Osteoporosis in her mother; Stomach cancer (age of onset: 56) in her father.  Laboratory Chemistry Profile   Renal Lab Results  Component Value Date   BUN 11 12/21/2021   CREATININE 0.72 12/21/2021   BCR 12 11/30/2021   GFRAA >60 04/26/2018   GFRNONAA >60 12/21/2021    Hepatic Lab Results  Component Value Date   AST 15  12/21/2021   ALT 22 12/21/2021   ALBUMIN 3.6 12/21/2021   ALKPHOS 25 (L) 12/21/2021   LIPASE 45 03/13/2021    Electrolytes Lab Results  Component Value Date   NA 137 12/21/2021   K 5.0 12/21/2021   CL 110 12/21/2021   CALCIUM 9.0 12/21/2021   MG 2.2 11/30/2021   PHOS 4.1 01/20/2009    Bone Lab Results  Component Value Date   VD25OH 10.03 (L) 03/08/2022   25OHVITD1 12 (L) 11/30/2021   25OHVITD2 5.1 11/30/2021   25OHVITD3 7.3 11/30/2021    Inflammation (CRP: Acute Phase) (ESR: Chronic Phase) Lab Results  Component Value Date   CRP <1 11/30/2021   ESRSEDRATE 3 11/30/2021   LATICACIDVEN 0.5 05/19/2009         Note: Above Lab results reviewed.  Recent Imaging Review  MM 3D SCREEN BREAST BILATERAL CLINICAL DATA:  Screening.  EXAM: DIGITAL SCREENING BILATERAL MAMMOGRAM WITH TOMOSYNTHESIS AND CAD  TECHNIQUE: Bilateral screening digital craniocaudal and  mediolateral oblique mammograms were obtained. Bilateral screening digital breast tomosynthesis was performed. The images were evaluated with computer-aided detection.  COMPARISON:  None available.  ACR Breast Density Category c: The breast tissue is heterogeneously dense, which may obscure small masses  FINDINGS: There are no findings suspicious for malignancy.  IMPRESSION: No mammographic evidence of malignancy. A result letter of this screening mammogram will be mailed directly to the patient.  RECOMMENDATION: Screening mammogram in one year. (Code:SM-B-01Y)  BI-RADS CATEGORY  1: Negative.  Electronically Signed   By: Everlean Alstrom M.D.   On: 12/30/2021 15:38 Note: Reviewed        Physical Exam  General appearance: Well nourished, well developed, and well hydrated. In no apparent acute distress Mental status: Alert, oriented x 3 (person, place, & time)       Respiratory: No evidence of acute respiratory distress Eyes: PERLA Vitals: BP 119/80   Pulse 77   Temp (!) 97.4 F (36.3 C)   Ht 5' 7"$   (1.702 m)   Wt 140 lb (63.5 kg)   LMP  (LMP Unknown)   SpO2 100%   BMI 21.93 kg/m  BMI: Estimated body mass index is 21.93 kg/m as calculated from the following:   Height as of this encounter: 5' 7"$  (1.702 m).   Weight as of this encounter: 140 lb (63.5 kg). Ideal: Ideal body weight: 61.6 kg (135 lb 12.9 oz) Adjusted ideal body weight: 62.4 kg (137 lb 7.7 oz)  Assessment   Diagnosis Status  1. Chronic shoulder pain (5th area of Pain) (Left)   2. Cervical radiculitis   3. Cervicalgia   4. Cervicogenic headache (Bilateral)   5. Chronic neck pain (4th area of Pain) (Midline) (Left)   6. Abnormal MRI, cervical spine (09/29/2021) ([facility])    Controlled Controlled Controlled   Updated Problems: Problem  Class 1 Obesity (Resolved)  Family Planning (Resolved)   Overview:  Desires BTL, not a good laparoscopic candidate. BTL papers resigned 08/01/14 considered for [facility].     Plan of Care  Problem-specific:  No problem-specific Assessment & Plan notes found for this encounter.  Ms. ZAYDAH SANSING has a current medication list which includes the following long-term medication(s): bupropion, lamotrigine, pantoprazole, sumatriptan, topiramate, vilazodone hcl, and [START ON 04/08/2022] zolpidem.  Pharmacotherapy (Medications Ordered): No orders of the defined types were placed in this encounter.  Orders:  Orders Placed This Encounter  Procedures   SHOULDER INJECTION    Standing Status:   Future    Standing Expiration Date:   06/29/2022    Scheduling Instructions:     Procedure: Intra-articular shoulder (Glenohumeral) joint and (AC) Acromioclavicular joint injection     Side: Left-sided     Level: Glenohumeral joint and (AC) Acromioclavicular joint     Sedation: Patient's choice.     Timeframe: As permitted by the schedule    Order Specific Question:   Where will this procedure be performed?    Answer:   [facility]   Follow-up plan:   Return for [facility]):  (L) IA Shoulder inj. #1.     Interventional Therapies  Risk  Complexity Considerations:   ANAPHYLAxis: Amoxicillin, Penicillin. ALLERGY: Sulfa Abx, MORPHINE   Planned  Pending:   Diagnostic/therapeutic left IA shoulder (glenohumeral and acromioclavicular) joint injection #1  Diagnostic/therapeutic left cervical ESI #1 (2 weeks after her shoulder injection, if necessary).     Under consideration:   Diagnostic bilateral lesser occipital NB #1    Completed:   Therapeutic L3-4 epidural  blood patch x1 (12/08/2021) (100/100/100/100)  (01/11/2022) steroid taper for her cervicogenic headache (100% relief)    Completed by other providers:   Diagnostic/therapeutic midline CESI (C7-T1) x1 (10/16/2021) by Wyn Quaker, MD ([facility])  Diagnostic/therapeutic left subacromial IA shoulder inj. x1 (11/06/2021) by Dr. Kayleen Memos [facility])  Therapeutic bilateral lumbar facet L2-5 MB RFA x1 (03/21/2013) by Kandace Blitz, MD ([place])  Therapeutic right lumbar facet L2-5 MB RFA x1 (05/30/2014) by Kandace Blitz, MD ([place])  Therapeutic left lumbar facet L2-5 MB RFA x1 (06/19/2014) by Kandace Blitz, MD ([place])  EMG/NCS (12/11/2014) by Rhea Bleacher, MD ([facility]) Dx: No evidence of large fiber peripheral neuropathy.    Therapeutic  Palliative (PRN) options:   None established   Pharmacological Recommendations:   None at this time.      Recent Visits Date Type Provider Dept  01/25/22 Office Visit Milinda Pointer, MD [facility]  01/11/22 Office Visit Milinda Pointer, MD [facility]  Showing recent visits within past 90 days and meeting all other requirements Today's Visits Date Type Provider Dept  03/31/22 Office Visit Milinda Pointer, MD [facility]  Showing today's visits and meeting all other requirements Future Appointments Date Type Provider Dept  04/06/22 Appointment Milinda Pointer, MD [facility]   Showing future appointments within next 90 days and meeting all other requirements  I discussed the assessment and treatment plan with the patient. The patient was provided an opportunity to ask questions and all were answered. The patient agreed with the plan and demonstrated an understanding of the instructions.  Patient advised to call back or seek an in-person evaluation if the symptoms or condition worsens.  Duration of encounter: 30 minutes.  Total time on encounter, as per AMA guidelines included both the face-to-face and non-face-to-face time personally spent by the physician and/or other qualified health care professional(s) on the day of the encounter (includes time in activities that require the physician or other qualified health care professional and does not include time in activities normally performed by clinical staff). Physician's time may include the following activities when performed: Preparing to see the patient (e.g., pre-charting review of records, searching for previously ordered imaging, lab work, and nerve conduction tests) Review of prior analgesic pharmacotherapies. Reviewing PMP Interpreting ordered tests (e.g., lab work, imaging, nerve conduction tests) Performing post-procedure evaluations, including interpretation of diagnostic procedures Obtaining and/or reviewing separately obtained history Performing a medically appropriate examination and/or evaluation Counseling and educating the patient/family/caregiver Ordering medications, tests, or procedures Referring and communicating with other health care professionals (when not separately reported) Documenting clinical information in the electronic or other health record Independently interpreting results (not separately reported) and communicating results to the patient/ family/caregiver Care coordination (not separately reported)  Note by: Gaspar Cola, MD Date: 03/31/2022; Time: 12:41 PM

## 2022-04-05 NOTE — Progress Notes (Addendum)
PROVIDER NOTE: Interpretation of information contained herein should be left to medically-trained personnel. Specific patient instructions are provided elsewhere under "Patient Instructions" section of medical record. This document was created in part using STT-dictation technology, any transcriptional errors that may result from this process are unintentional.  Patient: [name] Type: Established DOB: 02-12-1976 MRN: [id number] PCP: Leonard Downing, MD  Service: Procedure DOS: 04/06/2022 Setting: Ambulatory Location: Ambulatory outpatient facility Delivery: Face-to-face Provider: Gaspar Cola, MD Specialty: Interventional Pain Management Specialty designation: 09 Location: Outpatient facility Ref. Prov.: Leonard Downing, *       Interventional Therapy   Procedure: Glenohumeral and acromioclavicular joint Injection #1  Laterality: Left (-LT)  Level: Shoulder   Imaging: Fluoroscopy-guided         Anesthesia: Local anesthesia (1-2% Lidocaine) Anxiolysis: IV Versed 2.0 mg Sedation: No Sedation                       DOS: 04/06/2022  Performed by: Gaspar Cola, MD  Purpose: Diagnostic/Therapeutic Indications: Shoulder pain severe enough to impact quality of life or function. Rationale (medical necessity): procedure needed and proper for the diagnosis and/or treatment of Ms. Bourdon's medical symptoms and needs. 1. Chronic shoulder pain (5th area of Pain) (Left)   2. Arthropathy of left shoulder   3. Arthralgia of left acromioclavicular joint    NAS-11 Pain score:   Pre-procedure: 8 /10   Post-procedure: 0-No pain/10      Target: Glenohumeral and acromioclavicular joint Location: Intra-articular  Region: Entire Shoulder Area Approach: Anterior approach. Type of procedure: Percutaneous joint injection   Note: The patient comes into the clinic today indicating that lately she has been experiencing pain on both shoulders.  She has been sleeping  more on her right side and this seems to have triggered pain in that area.  Today we will be ordering some x-rays of both shoulders as well as the cervical spine.  Will go over those results with the patient on her next visit scheduled to be in approximately 2 weeks from now.  At that time, we will also evaluate the results of today's diagnostic left shoulder injection.  Position  Prep  Materials:  Position: Supine Prep solution: DuraPrep (Iodine Povacrylex [0.7% available iodine] and Isopropyl Alcohol, 74% w/w) Prep Area: Entire shoulder Area Materials:  Tray: Block Needle(s):  Type: Spinal  Gauge (G): 22  Length: 3.5-in  Qty: 1  Pre-op H&P Assessment:  Ms. Confair is a 47 y.o. (year old), female patient, seen today for interventional treatment. She  has a past surgical history that includes Gastric bypass (2010); Cholecystectomy; Hernia repair; gi bleed (2009); bowel obstruction (2011); Vaginal hysterectomy (N/A, 09/29/2015); Bilateral salpingectomy (Bilateral, 09/29/2015); Ovarian cyst removal (Left, 09/29/2015); Abdominal hysterectomy; Dilation and curettage of uterus; Esophagogastroduodenoscopy; Roux-en-y procedure; Esophagogastroduodenoscopy (egd) with propofol (N/A, 01/24/2018); and Colonoscopy with propofol (N/A, 01/24/2018). Ms. Sinquefield has a current medication list which includes the following prescription(s): acetaminophen, aimovig, bupropion, cyanocobalamin, diphenoxylate-atropine, hydrocodone-acetaminophen, hydroxyzine, lamotrigine, hm multivitamin adult gummy, pantoprazole, sumatriptan, topiramate, vilazodone hcl, vitamin d (ergocalciferol), and [START ON 04/08/2022] zolpidem. Her primarily concern today is the Shoulder Pain (left)  Initial Vital Signs:  Pulse/HCG Rate: 84  Temp: 98.4 F (36.9 C) Resp: 16 BP: 128/80 SpO2: 100 %  BMI: Estimated body mass index is 21.93 kg/m as calculated from the following:   Height as of this encounter: '5\' 7"'$  (1.702 m).   Weight as of  this encounter: 140 lb (63.5 kg).  Risk  Assessment: Allergies: Reviewed. She is allergic to amoxicillin, penicillins, morphine, and sulfa antibiotics.  Allergy Precautions: None required Coagulopathies: Reviewed. None identified.  Blood-thinner therapy: None at this time Active Infection(s): Reviewed. None identified. Ms. Zaring is afebrile  Site Confirmation: Ms. Schnall was asked to confirm the procedure and laterality before marking the site Procedure checklist: Completed Consent: Before the procedure and under the influence of no sedative(s), amnesic(s), or anxiolytics, the patient was informed of the treatment options, risks and possible complications. To fulfill our ethical and legal obligations, as recommended by the American Medical Association's Code of Ethics, I have informed the patient of my clinical impression; the nature and purpose of the treatment or procedure; the risks, benefits, and possible complications of the intervention; the alternatives, including doing nothing; the risk(s) and benefit(s) of the alternative treatment(s) or procedure(s); and the risk(s) and benefit(s) of doing nothing. The patient was provided information about the general risks and possible complications associated with the procedure. These may include, but are not limited to: failure to achieve desired goals, infection, bleeding, organ or nerve damage, allergic reactions, paralysis, and death. In addition, the patient was informed of those risks and complications associated to the procedure, such as failure to decrease pain; infection; bleeding; organ or nerve damage with subsequent damage to sensory, motor, and/or autonomic systems, resulting in permanent pain, numbness, and/or weakness of one or several areas of the body; allergic reactions; (i.e.: anaphylactic reaction); and/or death. Furthermore, the patient was informed of those risks and complications associated with the medications. These include,  but are not limited to: allergic reactions (i.e.: anaphylactic or anaphylactoid reaction(s)); adrenal axis suppression; blood sugar elevation that in diabetics may result in ketoacidosis or comma; water retention that in patients with history of congestive heart failure may result in shortness of breath, pulmonary edema, and decompensation with resultant heart failure; weight gain; swelling or edema; medication-induced neural toxicity; particulate matter embolism and blood vessel occlusion with resultant organ, and/or nervous system infarction; and/or aseptic necrosis of one or more joints. Finally, the patient was informed that Medicine is not an exact science; therefore, there is also the possibility of unforeseen or unpredictable risks and/or possible complications that may result in a catastrophic outcome. The patient indicated having understood very clearly. We have given the patient no guarantees and we have made no promises. Enough time was given to the patient to ask questions, all of which were answered to the patient's satisfaction. Ms. Lawry has indicated that she wanted to continue with the procedure. Attestation: I, the ordering provider, attest that I have discussed with the patient the benefits, risks, side-effects, alternatives, likelihood of achieving goals, and potential problems during recovery for the procedure that I have provided informed consent. Date  Time: 04/06/2022 11:27 AM   Imaging Guidance (Non-Spinal):          Type of Imaging Technique: Fluoroscopy Guidance (Non-Spinal) Indication(s): Assistance in needle guidance and placement for procedures requiring needle placement in or near specific anatomical locations not easily accessible without such assistance. Exposure Time: Please see nurses notes. Contrast: None used. Fluoroscopic Guidance: I was personally present during the use of fluoroscopy. "Tunnel Vision Technique" used to obtain the best possible view of the target  area. Parallax error corrected before commencing the procedure. "Direction-depth-direction" technique used to introduce the needle under continuous pulsed fluoroscopy. Once target was reached, antero-posterior, oblique, and lateral fluoroscopic projection used confirm needle placement in all planes. Images permanently stored in EMR. Interpretation: No contrast injected. I personally  interpreted the imaging intraoperatively. Adequate needle placement confirmed in multiple planes. Permanent images saved into the patient's record.  Pre-Procedure Preparation:  Monitoring: As per clinic protocol. Respiration, ETCO2, SpO2, BP, heart rate and rhythm monitor placed and checked for adequate function Safety Precautions: Patient was assessed for positional comfort and pressure points before starting the procedure. Time-out: I initiated and conducted the "Time-out" before starting the procedure, as per protocol. The patient was asked to participate by confirming the accuracy of the "Time Out" information. Verification of the correct person, site, and procedure were performed and confirmed by me, the nursing staff, and the patient. "Time-out" conducted as per Joint Commission's Universal Protocol (UP.01.01.01). Time: 1158  Description  Narrative of Procedure:          Rationale (medical necessity): procedure needed and proper for the diagnosis and/or treatment of the patient's medical symptoms and needs. Procedural Technique Safety Precautions: Aspiration looking for blood return was conducted prior to all injections. At no point did we inject any substances, as a needle was being advanced. No attempts were made at seeking any paresthesias. Safe injection practices and needle disposal techniques used. Medications properly checked for expiration dates. SDV (single dose vial) medications used. Description of the Procedure: Protocol guidelines were followed. The patient was placed in position over the procedure table.  The target area was identified and the area prepped in the usual manner. Skin & deeper tissues infiltrated with local anesthetic. Appropriate amount of time allowed to pass for local anesthetics to take effect. The procedure needles were then advanced to the target area. Proper needle placement secured. Negative aspiration confirmed. Solution injected in intermittent fashion, asking for systemic symptoms every 0.5cc of injectate. The needles were then removed and the area cleansed, making sure to leave some of the prepping solution back to take advantage of its long term bactericidal properties.             Vitals:   04/06/22 1126 04/06/22 1158 04/06/22 1207  BP: 128/80 111/70 108/69  Pulse: 84 86 82  Resp:  16 17  Temp: 98.4 F (36.9 C)    SpO2: 100% 100% 100%  Weight: 140 lb (63.5 kg)    Height: '5\' 7"'$  (1.702 m)       Start Time: 1158 hrs. End Time: 1203 hrs.  Antibiotic Prophylaxis:   Anti-infectives (From admission, onward)    None      Indication(s): None identified  Post-operative Assessment:  Post-procedure Vital Signs:  Pulse/HCG Rate: 82  Temp: 98.4 F (36.9 C) Resp: 17 BP: 108/69 SpO2: 100 %  EBL: None  Complications: No immediate post-treatment complications observed by team, or reported by patient.  Note: The patient tolerated the entire procedure well. A repeat set of vitals were taken after the procedure and the patient was kept under observation following institutional policy, for this type of procedure. Post-procedural neurological assessment was performed, showing return to baseline, prior to discharge. The patient was provided with post-procedure discharge instructions, including a section on how to identify potential problems. Should any problems arise concerning this procedure, the patient was given instructions to immediately contact us, at any time, without hesitation. In any case, we plan to contact the patient by telephone for a follow-up status  report regarding this interventional procedure.  Comments:  No additional relevant information.  Plan of Care (POC)  Orders:  Orders Placed This Encounter  Procedures   SHOULDER INJECTION    Scheduling Instructions:     Procedure: Intra-articular shoulder (Glenohumeral)  joint and [facility]) Acromioclavicular joint injection     Side: Left-sided     Level: Glenohumeral joint and (AC) Acromioclavicular joint     Sedation: Patient's choice.     Timeframe: Today    Order Specific Question:   Where will this procedure be performed?    Answer:   [facility]   DG Cervical Spine With Flex & Extend    Patient presents with axial pain with possible radicular component.  Please evaluate for any evidence of cervical spine instability. Describe the presence of any spondylolisthesis (Antero- or retrolisthesis). If present, provide displacement "Grade" and measurement in cm. Please describe presence and specific location (Level & Laterality) of any signs of  osteoarthritis, zygapophyseal (Facet) joints DJD (including decreased joint space and/or osteophytosis), DDD, Foraminal narrowing, as well as any sclerosis and/or cyst formation. Please comment on ROM. Patient presents with axial pain with possible radicular component. Please assist [place] in identifying specific level(s) and laterality of any additional findings such as: 1. Facet (Zygapophyseal) joint DJD (Hypertrophy, space narrowing, subchondral sclerosis, and/or osteophyte formation) 2. DDD and/or IVDD (Loss of disc height, desiccation, gas patterns, osteophytes, endplate sclerosis, or "Black disc disease") 3. Pars defects 4. Spondylolisthesis, spondylosis, and/or spondyloarthropathies (include Degree/Grade of displacement in mm) (stability) 5. Vertebral body Fractures (acute/chronic) (state percentage of collapse) 6. Demineralization (osteopenia/osteoporotic) 7. Bone pathology 8. Foraminal narrowing  9. Surgical changes    Standing Status:    Future    Number of Occurrences:   1    Standing Expiration Date:   05/05/2022    Scheduling Instructions:     Please make sure that the patient understands that this needs to be done as soon as possible. Never have the patient do the imaging "just before the next appointment". Inform patient that having the imaging done within the [facility] will expedite the availability of the results and will provide      imaging availability to the requesting physician. In addition inform the patient that the imaging order has an expiration date and will not be renewed if not done within the active period.    Order Specific Question:   Reason for Exam (SYMPTOM  OR DIAGNOSIS REQUIRED)    Answer:   Cervicalgia    Order Specific Question:   Is patient pregnant?    Answer:   No    Order Specific Question:   Preferred imaging location?    Answer:   [facility]    Order Specific Question:   Call Results- Best Contact Number?    Answer:   [phone] ([facility])    Order Specific Question:   Radiology Contrast Protocol - do NOT remove file path    Answer:   [email or web address]    Order Specific Question:   Release to patient    Answer:   Immediate   DG Shoulder Left    Please make sure that the patient understands that this needs to be done as soon as possible. Never have the patient do the imaging "just before the next appointment". Inform patient that having the imaging done within the [facility] will expedite the availability of the results and will provide imaging availability to the requesting physician. In addition inform the patient that the imaging order has an expiration date and will not be renewed if not done within the active period.    Standing Status:   Future    Number of Occurrences:   1    Standing Expiration Date:  05/05/2022    Scheduling Instructions:     Imaging must be done as soon as possible. Inform patient that order will expire  within 30 days and I will not renew it.    Order Specific Question:   Reason for Exam (SYMPTOM  OR DIAGNOSIS REQUIRED)    Answer:   Left shoulder pain    Order Specific Question:   Is the patient pregnant?    Answer:   No    Order Specific Question:   Preferred imaging location?    Answer:   [facility]    Order Specific Question:   Call Results- Best Contact Number?    Answer:   [phone] ([facility])    Order Specific Question:   Release to patient    Answer:   Immediate   DG PAIN CLINIC C-ARM 1-60 MIN NO REPORT    Intraoperative interpretation by procedural physician at [place].    Standing Status:   Standing    Number of Occurrences:   1    Order Specific Question:   Reason for exam:    Answer:   Assistance in needle guidance and placement for procedures requiring needle placement in or near specific anatomical locations not easily accessible without such assistance.   Follow-up    Schedule Ms. Minardi for a post-procedure follow-up evaluation encounter 2 weeks from now.    Standing Status:   Future    Standing Expiration Date:   04/20/2022    Scheduling Instructions:     Schedule follow-up visit on afternoon of procedure day (T, Th)     Type: Face-to-face (F2F) Post-procedure (PP) evaluation (E/M)     When: 2 weeks from now   Informed Consent Details: Physician/Practitioner Attestation; Transcribe to consent form and obtain patient signature    Note: Always confirm laterality of pain with Ms. Knab, before procedure.    Order Specific Question:   Physician/Practitioner attestation of informed consent for procedure/surgical case    Answer:   I, the physician/practitioner, attest that I have discussed with the patient the benefits, risks, side effects, alternatives, likelihood of achieving goals and potential problems during recovery for the procedure that I have provided informed consent.    Order Specific Question:   Procedure    Answer:    Intra-articular shoulder joint injection under fluoroscopic guidance    Order Specific Question:   Physician/Practitioner performing the procedure    Answer:   Damarys Speir A. Dossie Arbour, MD    Order Specific Question:   Indication/Reason    Answer:   Chronic shoulder pain secondary to shoulder arthropathy   Provide equipment / supplies at bedside    Procedure tray: "Block Tray" (Disposable  single use) Skin infiltration needle: Regular 1.5-in, 25-G, (x1) Block Needle type: Spinal Amount/quantity: 1 Size: Regular (3.5-inch) Gauge: 22G    Standing Status:   Standing    Number of Occurrences:   1    Order Specific Question:   Specify    Answer:   Block Tray   Chronic Opioid Analgesic:  No chronic opioid analgesics therapy prescribed by our practice. Hydrocodone/APAP 7.5/325, 1 tab p.o. 4 times daily (# 120) prescribed by Ursula Alert, MD (last filled on 12/12/2021) MME/day: 30 mg/day   Medications ordered for procedure: Meds ordered this encounter  Medications   lidocaine (XYLOCAINE) 2 % (with pres) injection 400 mg   pentafluoroprop-tetrafluoroeth (GEBAUERS) aerosol   lactated ringers infusion   midazolam (VERSED) injection 0.5-2 mg    Make sure Flumazenil is  available in the pyxis when using this medication. If oversedation occurs, administer 0.2 mg IV over 15 sec. If after 45 sec no response, administer 0.2 mg again over 1 min; may repeat at 1 min intervals; not to exceed 4 doses (1 mg)   ropivacaine (PF) 2 mg/mL (0.2%) (NAROPIN) injection 9 mL   methylPREDNISolone acetate (DEPO-MEDROL) injection 80 mg   Medications administered: We administered lidocaine, pentafluoroprop-tetrafluoroeth, lactated ringers, midazolam, ropivacaine (PF) 2 mg/mL (0.2%), and methylPREDNISolone acetate.  See the medical record for exact dosing, route, and time of administration.  Follow-up plan:   Return in about 2 weeks (around 04/20/2022) for Proc-day (T,Th), (Face2F), (PPE).       Interventional  Therapies  Risk  Complexity Considerations:   ANAPHYLAxis: Amoxicillin, Penicillin. ALLERGY: Sulfa Abx, MORPHINE   Planned  Pending:   Diagnostic/therapeutic left IA shoulder (glenohumeral and acromioclavicular) joint injection #1  Diagnostic/therapeutic left cervical ESI #1 (2 weeks after her shoulder injection, if necessary).  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 x1 (11/06/2021) by Dr. Kayleen [facility])  Therapeutic bilateral lumbar facet L2-5 MB RFA x1 (03/21/2013) by Kandace Blitz, MD ([place])  Therapeutic right lumbar facet L2-5 MB RFA x1 (05/30/2014) by Kandace Blitz, MD ([place])  Therapeutic left lumbar facet L2-5 MB RFA x1 (06/19/2014) by Kandace Blitz, MD ([place])  EMG/NCS (12/11/2014) by Rhea Bleacher, MD ([facility]) Dx: No evidence of large fiber peripheral neuropathy.    Therapeutic  Palliative (PRN) options:   None established   Pharmacological Recommendations:   None at this time.      Recent Visits Date Type Provider Dept  03/31/22 Office Visit Milinda Pointer, MD [facility]  01/25/22 Office Visit Milinda Pointer, MD [facility]  01/11/22 Office Visit Milinda Pointer, MD [facility]  Showing recent visits within past 90 days and meeting all other requirements Today's Visits Date Type Provider Dept  04/06/22 Procedure visit Milinda Pointer, MD [facility]   Showing today's visits and meeting all other requirements Future Appointments Date Type Provider Dept  04/21/22 Appointment Milinda Pointer, MD [facility]  Showing future appointments within next 90 days and meeting all other requirements  Disposition: Discharge home  Discharge (Date  Time): 04/06/2022; 1208 hrs.   Primary Care Physician: Leonard Downing, MD Location: [facility] Note by: Gaspar Cola, MD (TTS technology used. I apologize for any typographical errors that were not detected and corrected.) Date: 04/06/2022; Time: 1:11 PM  Disclaimer:  Medicine is not an Chief Strategy Officer. The only guarantee in medicine is that nothing is guaranteed. It is important to note that the decision to proceed with this intervention was based on the information collected from the patient. The Data and conclusions were drawn from the patient's questionnaire, the interview, and the physical examination. Because the information was provided in large part by the patient, it cannot be guaranteed that it has not been purposely or unconsciously manipulated. Every effort has been made to obtain as much relevant data as possible for this evaluation.  It is important to note that the conclusions that lead to this procedure are derived in large part from the available data. Always take into account that the treatment will also be dependent on availability of resources and existing treatment guidelines, considered by other Pain Management Practitioners as being common knowledge and practice, at the time of the intervention. For Medico-Legal purposes, it is also important to point out that variation in procedural techniques and pharmacological choices are the acceptable norm. The indications, contraindications, technique, and results of the above procedure should only be interpreted and judged by a Board-Certified Interventional Pain Specialist with extensive familiarity  and expertise in the same exact procedure and technique.

## 2022-04-06 ENCOUNTER — Ambulatory Visit
Admission: RE | Admit: 2022-04-06 | Discharge: 2022-04-06 | Disposition: A | Discharge status: Home / Self Care | Payer: Medicare Other | Attending: Pain Medicine | Admitting: Pain Medicine

## 2022-04-06 ENCOUNTER — Encounter: Payer: Self-pay | Admitting: Pain Medicine

## 2022-04-06 ENCOUNTER — Ambulatory Visit
Admission: RE | Admit: 2022-04-06 | Discharge: 2022-04-06 | Disposition: A | Discharge status: Home / Self Care | Payer: Medicare Other | Source: Ambulatory Visit | Attending: Pain Medicine | Admitting: Pain Medicine

## 2022-04-06 ENCOUNTER — Encounter: Payer: Self-pay | Admitting: Oncology

## 2022-04-06 ENCOUNTER — Ambulatory Visit (HOSPITAL_BASED_OUTPATIENT_CLINIC_OR_DEPARTMENT_OTHER): Payer: Medicare Other | Admitting: Pain Medicine

## 2022-04-06 VITALS — BP 108/69 | HR 82 | Temp 98.4°F | Resp 17 | Ht 67.0 in | Wt 140.0 lb

## 2022-04-06 DIAGNOSIS — M19012 Primary osteoarthritis, left shoulder: Secondary | ICD-10-CM

## 2022-04-06 DIAGNOSIS — M25512 Pain in left shoulder: Secondary | ICD-10-CM

## 2022-04-06 DIAGNOSIS — Z5189 Encounter for other specified aftercare: Secondary | ICD-10-CM | POA: Insufficient documentation

## 2022-04-06 DIAGNOSIS — M5030 Other cervical disc degeneration, unspecified cervical region: Secondary | ICD-10-CM

## 2022-04-06 DIAGNOSIS — M542 Cervicalgia: Secondary | ICD-10-CM

## 2022-04-06 DIAGNOSIS — M25511 Pain in right shoulder: Secondary | ICD-10-CM | POA: Insufficient documentation

## 2022-04-06 DIAGNOSIS — M5412 Radiculopathy, cervical region: Secondary | ICD-10-CM

## 2022-04-06 DIAGNOSIS — G8929 Other chronic pain: Secondary | ICD-10-CM

## 2022-04-06 MED ORDER — LIDOCAINE HCL 2 % IJ SOLN
INTRAMUSCULAR | Status: AC
  Filled 2022-04-06: qty 20

## 2022-04-06 MED ORDER — METHYLPREDNISOLONE ACETATE 80 MG/ML IJ SUSP
80.0000 mg | Freq: Once | INTRAMUSCULAR | Status: AC
  Administered 2022-04-06: 80 mg via INTRA_ARTICULAR

## 2022-04-06 MED ORDER — PENTAFLUOROPROP-TETRAFLUOROETH EX AERO
INHALATION_SPRAY | Freq: Once | CUTANEOUS | Status: AC
  Administered 2022-04-06: 30 via TOPICAL
  Filled 2022-04-06: qty 116

## 2022-04-06 MED ORDER — ROPIVACAINE HCL 2 MG/ML IJ SOLN
9.0000 mL | Freq: Once | INTRAMUSCULAR | Status: AC
  Administered 2022-04-06: 9 mL via INTRA_ARTICULAR

## 2022-04-06 MED ORDER — LIDOCAINE HCL 2 % IJ SOLN
20.0000 mL | Freq: Once | INTRAMUSCULAR | Status: AC
  Administered 2022-04-06: 400 mg

## 2022-04-06 MED ORDER — METHYLPREDNISOLONE ACETATE 80 MG/ML IJ SUSP
INTRAMUSCULAR | Status: AC
  Filled 2022-04-06: qty 1

## 2022-04-06 MED ORDER — ROPIVACAINE HCL 2 MG/ML IJ SOLN
INTRAMUSCULAR | Status: AC
  Filled 2022-04-06: qty 20

## 2022-04-06 MED ORDER — LACTATED RINGERS IV SOLN: Freq: Once | INTRAVENOUS | Status: AC

## 2022-04-06 MED ORDER — MIDAZOLAM HCL 2 MG/2ML IJ SOLN
0.5000 mg | Freq: Once | INTRAMUSCULAR | Status: AC
  Administered 2022-04-06: 2 mg via INTRAVENOUS

## 2022-04-06 MED ORDER — MIDAZOLAM HCL 2 MG/2ML IJ SOLN
INTRAMUSCULAR | Status: AC
  Filled 2022-04-06: qty 2

## 2022-04-06 NOTE — Patient Instructions (Signed)

## 2022-04-06 NOTE — Progress Notes (Signed)
Safety precautions to be maintained throughout the outpatient stay will include: orient to surroundings, keep bed in low position, maintain call bell within reach at all times, provide assistance with transfer out of bed and ambulation.  

## 2022-04-07 ENCOUNTER — Telehealth: Payer: Self-pay

## 2022-04-07 ENCOUNTER — Other Ambulatory Visit (HOSPITAL_BASED_OUTPATIENT_CLINIC_OR_DEPARTMENT_OTHER): Payer: Medicare Other | Admitting: Pain Medicine

## 2022-04-07 DIAGNOSIS — M25512 Pain in left shoulder: Secondary | ICD-10-CM | POA: Insufficient documentation

## 2022-04-07 DIAGNOSIS — G8929 Other chronic pain: Secondary | ICD-10-CM

## 2022-04-07 NOTE — Progress Notes (Signed)
The patient describes worsening of her left shoulder pain status post left intra-articular shoulder joint injection.  Diagnostic x-rays were negative suggesting soft tissue pathology.  MRI will be ordered today.

## 2022-04-07 NOTE — Telephone Encounter (Signed)
Post procedure follow up.  Patient states she is having worse pain in the shoulder.  States she can't even let her arm dangle without holding it up.  States any bit of movement at all is terrible.  States she got 4 hours of pain relief after the procedure.  Will discuss with Dr Dossie Arbour.

## 2022-04-08 ENCOUNTER — Telehealth: Payer: Self-pay

## 2022-04-08 NOTE — Telephone Encounter (Signed)
Received fax from pharmacy stating that the drug is not covered by patients insurance, the preferred alternative is Trazodone HCL, Zolpidem Tartrate please advise.  zolpidem (AMBIEN CR) 12.5 MG CR tablet (not covered)

## 2022-04-08 NOTE — Telephone Encounter (Signed)
Contacted patient, discussed that we received a letter about zolpidem extended release.  Patient reports she just spoke to [facility] and all they need is a prior authorization.  I already communicated with Janett Billow CMA, will route this to CMA to address this.  Patient reports she does have a few more pills available.  However she could use a prescription card if she needs a few more supplies and she will let us know if she needs it sooner.

## 2022-04-09 NOTE — Telephone Encounter (Signed)
Prior auth pending

## 2022-04-12 ENCOUNTER — Other Ambulatory Visit: Payer: Self-pay | Admitting: Pain Medicine

## 2022-04-12 NOTE — Telephone Encounter (Signed)
per the pharmaist patient picked and paided $20 for medication.

## 2022-04-15 ENCOUNTER — Ambulatory Visit
Admission: RE | Admit: 2022-04-15 | Discharge: 2022-04-15 | Disposition: A | Discharge status: Home / Self Care | Payer: Medicare Other | Source: Ambulatory Visit | Attending: Pain Medicine | Admitting: Pain Medicine

## 2022-04-15 DIAGNOSIS — M25512 Pain in left shoulder: Secondary | ICD-10-CM | POA: Diagnosis present

## 2022-04-15 DIAGNOSIS — G8929 Other chronic pain: Secondary | ICD-10-CM | POA: Insufficient documentation

## 2022-04-20 ENCOUNTER — Ambulatory Visit: Payer: Medicare Other | Admitting: Pain Medicine

## 2022-04-20 DIAGNOSIS — M19012 Primary osteoarthritis, left shoulder: Secondary | ICD-10-CM | POA: Insufficient documentation

## 2022-04-20 DIAGNOSIS — M778 Other enthesopathies, not elsewhere classified: Secondary | ICD-10-CM | POA: Insufficient documentation

## 2022-04-20 DIAGNOSIS — M25512 Pain in left shoulder: Secondary | ICD-10-CM | POA: Insufficient documentation

## 2022-04-20 NOTE — Progress Notes (Unsigned)
PROVIDER NOTE: Information contained herein reflects review and annotations entered in association with encounter. Interpretation of such information and data should be left to medically-trained personnel. Information provided to patient can be located elsewhere in the medical record under "Patient Instructions". Document created using STT-dictation technology, any transcriptional errors that may result from process are unintentional.    Patient: [name]  Service Category: E/M  Provider: Gaspar Cola, MD  DOB: 1975/11/01  DOS: 04/21/2022  Referring Provider: Leonard Downing, *  MRN: [id number]  Specialty: Interventional Pain Management  PCP: Leonard Downing, MD  Type: Established Patient  Setting: Ambulatory outpatient    Location: Office  Delivery: Face-to-face     HPI  Ms. HAELY HARTNESS, a 47 y.o. year old female, is here today because of her Chronic left shoulder pain [M25.512, G89.29]. Ms. Knapke primary complain today is Shoulder Pain (left) Last encounter: My last encounter with her was on 04/06/2022. Pertinent problems: Ms. Weaks has Abnormal ECG; Achilles bursitis; Achilles tendinitis; Chronic pain associated with significant psychosocial dysfunction; DDD (degenerative disc disease), lumbar; Disorder of sacrum; DDD (degenerative disc disease), lumbosacral; Lumbar facet arthropathy; Chronic low back pain (2ry area of Pain) (Right) w/ sciatica (Right); Lumbar and sacral osteoarthritis; Abdominal pain, right upper quadrant; Arthralgia, sacroiliac; Abdominal pain, generalized; Neuropathy; Cervical radiculitis; History of idiopathic intracranial hypertension; Abnormal MRI, lumbar spine (01/28/2017 & 12/11/2021); History of bariatric surgery; Idiopathic small fiber peripheral neuropathy (1ry area of Pain) (feet> hands); Chronic feet pain (Bilateral); Chronic hand pain (Bilateral); Chronic lower extremity pain (3ry area of Pain) (Right) (intermittent); Chronic neck  pain (4th area of Pain) (Midline) (Left); Cervicalgia; Chronic shoulder pain (5th area of Pain) (Left); Complications of bariatric procedures; Postoperative intestinal malabsorption; Abnormal MRI, cervical spine (09/29/2021) ([facility]); Cervicogenic headache (Bilateral); Occipital neuralgia (Bilateral); Arthralgia of left acromioclavicular joint; DDD (degenerative disc disease), cervical; Redacted shoulder pain (Bilateral) (L>R); Chronic shoulder pain (Right); Acute pain of left shoulder; Osteoarthritis of AC (acromioclavicular) joint (Left); Arthralgia of shoulder region (Left); and Enthesopathy of shoulder (Left) on their pertinent problem list. Pain Assessment: Severity of Chronic pain is reported as a 7 /10. Location: Shoulder Left/pain radiaities up her neck, new pain in her left thumb. Onset: More than a month ago. Quality: Aching, Constant, Burning, Numbness, Discomfort. Timing: Constant. Modifying factor(s): nothing. Vitals:  height is '5\' 7"'$  (1.702 m) and weight is 140 lb (63.5 kg). Her temperature is 98.3 F (36.8 C). Her blood pressure is 116/66 and her pulse is 102 (abnormal). Her oxygen saturation is 96%.  BMI: Estimated body mass index is 21.93 kg/m as calculated from the following:   Height as of this encounter: '5\' 7"'$  (1.702 m).   Weight as of this encounter: 140 lb (63.5 kg).  Reason for encounter: post-procedure evaluation and assessment.  The patient indicated today after the procedure that she was having increased pain in the shoulder once the local anesthetic wore off.  The patient describes that the numbing medicine only lasted for approximately 1 hour until she went down to have some x-rays of the shoulder done and at that time he came back.  These results were not concordant or expected based on the use of ropivacaine as a primary local anesthetic.  After that it continued to get worse with pain shooting up into the area of the neck over the suprascapular muscle and down the left  hand all the way down into the thumb.  She indicates that this flareup lasted for approximately 1 week and  currently she describes her pain as being back to baseline.  Based on the flareup of the pain that she described, we went ahead and order an MRI of the left shoulder.  The results are as seen below.  (04/15/2022) left shoulder MRI ordered after patient complained of worsening of left shoulder status post intra-articular injection (04/06/2022): (04/15/2022) LEFT SHOULDER MRI IMPRESSION (post-procedure): 1. Intact rotator cuff. 2. Minimal acromioclavicular osteoarthritis. 3. Mild-to-moderate thinning of the anterior glenoid cartilage at 9 o'clock. Otherwise, no significant osteoarthritis.  The MRI does not seem to explain the etiology of the patient's pain.  Specifically she indicated that the pain seem to travel over the suprascapular muscle and deltoid muscle and through the lateral aspect of the arm and it failed to acid was referring pain all the way into her thumb.  This type of pattern could suggest a radicular component.  However on 09/29/2021 the patient had a cervical MRI done at [facility] that seem to show a 2 mm broad-based paracentral disc protrusion towards the right side at the C5-6 level which barely contacts the right C6 nerve root in the lateral recess.  This type of finding would typically give [place] symptoms in the right upper extremity and not the left possibly affecting the C6 and C7 nerve roots in the dermatomal distribution.  Although this could trigger pain in the thumb and index finger, it would be expected to happen on the right upper extremity.  The patient is currently having no such symptoms.  Because the pain seems to go over the suprascapular muscle down into the shoulder, I have decided to schedule her for a diagnostic left suprascapular nerve block under fluoroscopic guidance.  Post-procedure evaluation   Procedure: Glenohumeral and acromioclavicular joint Injection #1   Laterality: Left (-LT)  Level: Shoulder   Imaging: Fluoroscopy-guided         Anesthesia: Local anesthesia (1-2% Lidocaine) Anxiolysis: IV Versed 2.0 mg Sedation: No Sedation                       DOS: 04/06/2022  Performed by: Gaspar Cola, MD  Purpose: Diagnostic/Therapeutic Indications: Shoulder pain severe enough to impact quality of life or function. Rationale (medical necessity): procedure needed and proper for the diagnosis and/or treatment of Ms. Whaling's medical symptoms and needs. 1. Chronic shoulder pain (5th area of Pain) (Left)   2. Arthropathy of left shoulder   3. Arthralgia of left acromioclavicular joint    NAS-11 Pain score:   Pre-procedure: 8 /10   Post-procedure: 0-No pain/10       Effectiveness:  Initial hour after procedure: 100 %. Subsequent 4-6 hours post-procedure: 0 %. Analgesia past initial 6 hours: 0 %. Ongoing improvement:  Analgesic: The patient indicated having had a flareup of the pain with no long-term benefit. Function: No benefit ROM: No benefit  Pharmacotherapy Assessment  Analgesic: No chronic opioid analgesics therapy prescribed by our practice. Hydrocodone/APAP 7.5/325, 1 tab p.o. 4 times daily (# 120) prescribed by Ursula Alert, MD (last filled on 12/12/2021) MME/day: 30 mg/day   Monitoring: [place] PMP: PDMP reviewed during this encounter.       Pharmacotherapy: No side-effects or adverse reactions reported. Compliance: No problems identified. Effectiveness: Clinically acceptable.  Chauncey Fischer, RN  04/21/2022  8:37 AM  Sign when Signing Visit Safety precautions to be maintained throughout the outpatient stay will include: orient to surroundings, keep bed in low position, maintain call bell within reach at all times, provide assistance with  transfer out of bed and ambulation.     No results found for: "CBDTHCR" No results found for: "D8THCCBX" No results found for: "D9THCCBX"  UDS:  Summary  Date Value Ref Range Status   11/30/2021 Note  Final    Comment:    ==================================================================== Compliance Drug Analysis, Ur ==================================================================== Test                             Result       Flag       Units  Drug Present   Topiramate                     PRESENT   Lamotrigine                    PRESENT   Zolpidem                       PRESENT   Zolpidem Acid                  PRESENT    Zolpidem acid is an expected metabolite of zolpidem.    Bupropion                      PRESENT   Hydroxybupropion               PRESENT    Hydroxybupropion is an expected metabolite of bupropion.    Vilazodone                     PRESENT   Acetaminophen                  PRESENT ==================================================================== Test                      Result    Flag   Units      Ref Range   Creatinine              36               mg/dL      >=20 ==================================================================== Declared Medications:  Medication list was not provided. ==================================================================== For clinical consultation, please call [phone]. ====================================================================       ROS  Constitutional: Denies any fever or chills Gastrointestinal: No reported hemesis, hematochezia, vomiting, or acute GI distress Musculoskeletal: Denies any acute onset joint swelling, redness, loss of ROM, or weakness Neurological: No reported episodes of acute onset apraxia, aphasia, dysarthria, agnosia, amnesia, paralysis, loss of coordination, or loss of consciousness  Medication Review  Erenumab-aooe, HM Multivitamin Adult Gummy, HYDROcodone-acetaminophen, SUMAtriptan, Vilazodone HCl, Vitamin D (Ergocalciferol), acetaminophen, buPROPion, cyanocobalamin, diphenoxylate-atropine, hydrOXYzine, lamoTRIgine, pantoprazole, topiramate, and  zolpidem  History Review  Allergy: Ms. Gardon is allergic to amoxicillin, penicillins, morphine, and sulfa antibiotics. Drug: Ms. Farnell  reports no history of drug use. Alcohol:  reports no history of alcohol use. Tobacco:  reports that she has never smoked. She has never used smokeless tobacco. Social: Ms. Fredenberg  reports that she has never smoked. She has never used smokeless tobacco. She reports that she does not drink alcohol and does not use drugs. Medical:  has a past medical history of Abnormal ECG, Anemia, Anxiety, Arthritis, Asthma, B12 deficiency, Cardiomyopathy ([place]), Chronic abdominal pain, Complication of anesthesia, COVID-19 (01/18/2019), DDD (degenerative disc disease), lumbar, Depression, Diabetes mellitus without complication ([place]), Dysrhythmia,  GERD (gastroesophageal reflux disease), Iron deficiency, Migraine headache, Neuropathy, Obesity, Ovarian cyst, Pneumonia, PTSD (post-traumatic stress disorder), PTSD (post-traumatic stress disorder), Rh negative status during pregnancy, Seizures ([place]), and Small bowel obstruction ([place]). Surgical: Ms. Exantus  has a past surgical history that includes Gastric bypass (2010); Cholecystectomy; Hernia repair; gi bleed (2009); bowel obstruction (2011); Vaginal hysterectomy (N/A, 09/29/2015); Bilateral salpingectomy (Bilateral, 09/29/2015); Ovarian cyst removal (Left, 09/29/2015); Abdominal hysterectomy; Dilation and curettage of uterus; Esophagogastroduodenoscopy; Roux-en-y procedure; Esophagogastroduodenoscopy (egd) with propofol (N/A, 01/24/2018); and Colonoscopy with propofol (N/A, 01/24/2018). Family: family history includes Anxiety disorder in her mother and sister; Arthritis/Rheumatoid in her mother; Clotting disorder in her mother; Colon polyps in her mother and sister; Depression in her mother and sister; Heart attack in her father and mother; Heart block in her mother; Heart disease in her father; Osteoporosis in her mother; Stomach  cancer (age of onset: 2) in her father.  Laboratory Chemistry Profile   Renal Lab Results  Component Value Date   BUN 11 12/21/2021   CREATININE 0.72 12/21/2021   BCR 12 11/30/2021   GFRAA >60 04/26/2018   GFRNONAA >60 12/21/2021    Hepatic Lab Results  Component Value Date   AST 15 12/21/2021   ALT 22 12/21/2021   ALBUMIN 3.6 12/21/2021   ALKPHOS 25 (L) 12/21/2021   LIPASE 45 03/13/2021    Electrolytes Lab Results  Component Value Date   NA 137 12/21/2021   K 5.0 12/21/2021   CL 110 12/21/2021   CALCIUM 9.0 12/21/2021   MG 2.2 11/30/2021   PHOS 4.1 01/20/2009    Bone Lab Results  Component Value Date   VD25OH 10.03 (L) 03/08/2022   25OHVITD1 12 (L) 11/30/2021   25OHVITD2 5.1 11/30/2021   25OHVITD3 7.3 11/30/2021    Inflammation (CRP: Acute Phase) (ESR: Chronic Phase) Lab Results  Component Value Date   CRP <1 11/30/2021   ESRSEDRATE 3 11/30/2021   LATICACIDVEN 0.5 05/19/2009         Note: Above Lab results reviewed.  Recent Imaging Review  MR SHOULDER LEFT WO CONTRAST CLINICAL DATA:  Chronic left shoulder pain. Erosive osteoarthritis suspected. Bursitis suspected.  EXAM: MRI OF THE LEFT SHOULDER WITHOUT CONTRAST  TECHNIQUE: Multiplanar, multisequence MR imaging of the shoulder was performed. No intravenous contrast was administered.  COMPARISON:  Left shoulder radiographs 04/06/2022  FINDINGS: Rotator cuff: The supraspinatus, infraspinatus, subscapularis, and teres minor are intact.  Muscles: No rotator cuff muscle atrophy, fatty infiltration, or edema.  Biceps long head: The intra-articular long head of the biceps tendon is intact.  Acromioclavicular Joint: Normal alignment of the acromioclavicular joint with minimal joint space narrowing and peripheral osteophytosis. Type II acromion. No subacromial/subdeltoid bursitis.  Glenohumeral Joint: Mild-to-moderate thinning of the anterior glenoid cartilage at 9 o'clock.  Labrum: Grossly  intact, but evaluation is limited by lack of intraarticular fluid.  Bones:  No acute fracture.  Other: None.  IMPRESSION: 1. Intact rotator cuff. 2. Minimal acromioclavicular osteoarthritis. 3. Mild-to-moderate thinning of the anterior glenoid cartilage at 9 o'clock. Otherwise, no significant osteoarthritis.  Electronically Signed   By: Yvonne Kendall M.D.   On: 04/15/2022 17:38 Note: Reviewed        Physical Exam  General appearance: Well nourished, well developed, and well hydrated. In no apparent acute distress Mental status: Alert, oriented x 3 (person, place, & time)       Respiratory: No evidence of acute respiratory distress Eyes: PERLA Vitals: BP 116/66   Pulse (!) 102   Temp 98.3 F (  36.8 C)   Ht '5\' 7"'$  (1.702 m)   Wt 140 lb (63.5 kg)   LMP  (LMP Unknown)   SpO2 96%   BMI 21.93 kg/m  BMI: Estimated body mass index is 21.93 kg/m as calculated from the following:   Height as of this encounter: '5\' 7"'$  (1.702 m).   Weight as of this encounter: 140 lb (63.5 kg). Ideal: Ideal body weight: 61.6 kg (135 lb 12.9 oz) Adjusted ideal body weight: 62.4 kg (137 lb 7.7 oz)  Assessment   Diagnosis Status  1. Chronic shoulder pain (5th area of Pain) (Left)   2. Osteoarthritis of AC (acromioclavicular) joint (Left)   3. Acute pain of left shoulder   4. Arthralgia of left acromioclavicular joint   5. Arthralgia of shoulder region (Left)   6. Enthesopathy of shoulder (Left)   7. Abnormal MRI, cervical spine (09/29/2021) ([facility])    Controlled Controlled Controlled   Updated Problems: No problems updated.   Plan of Care  Problem-specific:  No problem-specific Assessment & Plan notes found for this encounter.  Ms. KLOE PAGETT has a current medication list which includes the following long-term medication(s): bupropion, lamotrigine, pantoprazole, sumatriptan, topiramate, vilazodone hcl, and zolpidem.  Pharmacotherapy (Medications Ordered): No orders of the  defined types were placed in this encounter.  Orders:  Orders Placed This Encounter  Procedures   SUPRASCAPULAR NERVE BLOCK    For shoulder pain.    Standing Status:   Future    Standing Expiration Date:   07/22/2022    Scheduling Instructions:     Purpose: Diagnostic     Laterality: Left-sided     Level(s): Suprascapular notch     Sedation: Patient's choice.     Scheduling Timeframe: ASAP    Order Specific Question:   Where will this procedure be performed?    Answer:   [facility]   Ambulatory referral to Physical Therapy    Referral Priority:   Routine    Referral Type:   Physical Medicine    Referral Reason:   Specialty Services Required    Requested Specialty:   Physical Therapy    Number of Visits Requested:   1   Follow-up plan:   Return for (Clinic): (L) SSNB #1.      Interventional Therapies  Risk Factors  Considerations:   ALLERGY (Anaphylactic): Amoxicillin, PCN. Allergy: Sulfa Abx, MORPHINE   Planned  Pending:   Diagnostic left suprascapular NB #1  Referral to physical therapy to work on left shoulder (04/21/2022)    Under consideration:   Diagnostic/therapeutic left cervical ESI #1 (2 weeks after her shoulder injection, if necessary).  Diagnostic bilateral lesser occipital NB #1  May need to repeat the cervical MRI based on left shoulder and upper extremity symptoms.    Completed:   Diagnostic left IA shoulder (glenohumeral & acromioclavicular) joint inj. x1 (100/0/0/0) (Non-concordant)  Therapeutic L3-4 epidural blood patch x1 (12/08/2021) (100/100/100/100)  (01/11/2022) steroid taper for her cervicogenic headache (100% relief)    Completed by other providers:   Diagnostic/therapeutic right L5 & S1 TFESI x1 (09/11/2021) by Laroy Apple, MD ([facility])  Diagnostic/therapeutic midline CESI (C7-T1) x1 (10/16/2021) by Wyn Quaker, MD ([facility])  Diagnostic/therapeutic left subacromial IA shoulder inj. x1 (11/06/2021) by Dr.  Kayleen Memos [facility])  Therapeutic bilateral lumbar facet L2-5 MB RFA x1 (03/21/2013) by Kandace Blitz, MD ([facility])  Therapeutic right lumbar facet L2-5 MB RFA x1 (05/30/2014) by Kandace Blitz, MD ([facility])  Therapeutic left lumbar facet L2-5 MB RFA x1 (  06/19/2014) by Kandace Blitz, MD ([facility])  EMG/NCS (12/11/2014) by Rhea Bleacher, MD ([facility]) Dx: No evidence of large fiber peripheral neuropathy.    Therapeutic  Palliative (PRN) options:   None established   Pharmacotherapy  No chronic opioid analgesics therapy prescribed by our practice.       Recent Visits Date Type Provider Dept  04/06/22 Procedure visit Milinda Pointer, MD [facility]  03/31/22 Office Visit Milinda Pointer, MD [facility]  01/25/22 Office Visit Milinda Pointer, MD [facility]  Showing recent visits within past 90 days and meeting all other requirements Today's Visits Date Type Provider Dept  04/21/22 Office Visit Milinda Pointer, MD [facility]  Showing today's visits and meeting all other requirements Future Appointments Date Type Provider Dept  05/06/22 Appointment Milinda Pointer, MD [facility]  Showing future appointments within next 90 days and meeting all other requirements  I discussed the assessment and treatment plan with the patient. The patient was provided an opportunity to ask questions and all were answered. The patient agreed with the plan and demonstrated an understanding of the instructions.  Patient advised to call back or seek an in-person evaluation if the symptoms or condition worsens.  Duration of encounter: 34 minutes.  Total time on encounter, as per AMA guidelines included both the face-to-face and non-face-to-face time personally spent by the physician and/or other qualified health care professional(s) on the day of the encounter (includes time in activities that require the physician or  other qualified health care professional and does not include time in activities normally performed by clinical staff). Physician's time may include the following activities when performed: Preparing to see the patient (e.g., pre-charting review of records, searching for previously ordered imaging, lab work, and nerve conduction tests) Review of prior analgesic pharmacotherapies. Reviewing PMP Interpreting ordered tests (e.g., lab work, imaging, nerve conduction tests) Performing post-procedure evaluations, including interpretation of diagnostic procedures Obtaining and/or reviewing separately obtained history Performing a medically appropriate examination and/or evaluation Counseling and educating the patient/family/caregiver Ordering medications, tests, or procedures Referring and communicating with other health care professionals (when not separately reported) Documenting clinical information in the electronic or other health record Independently interpreting results (not separately reported) and communicating results to the patient/ family/caregiver Care coordination (not separately reported)  Note by: Gaspar Cola, MD Date: 04/21/2022; Time: 9:30 AM

## 2022-04-21 ENCOUNTER — Ambulatory Visit: Payer: Medicare Other | Attending: Pain Medicine | Admitting: Pain Medicine

## 2022-04-21 ENCOUNTER — Encounter: Payer: Self-pay | Admitting: Pain Medicine

## 2022-04-21 VITALS — BP 116/66 | HR 102 | Temp 98.3°F | Ht 67.0 in | Wt 140.0 lb

## 2022-04-21 DIAGNOSIS — G8929 Other chronic pain: Secondary | ICD-10-CM | POA: Diagnosis present

## 2022-04-21 DIAGNOSIS — M19012 Primary osteoarthritis, left shoulder: Secondary | ICD-10-CM | POA: Diagnosis present

## 2022-04-21 DIAGNOSIS — R937 Abnormal findings on diagnostic imaging of other parts of musculoskeletal system: Secondary | ICD-10-CM

## 2022-04-21 DIAGNOSIS — M25512 Pain in left shoulder: Secondary | ICD-10-CM | POA: Diagnosis present

## 2022-04-21 DIAGNOSIS — M778 Other enthesopathies, not elsewhere classified: Secondary | ICD-10-CM | POA: Diagnosis present

## 2022-04-21 NOTE — Patient Instructions (Signed)

## 2022-04-21 NOTE — Progress Notes (Signed)
Safety precautions to be maintained throughout the outpatient stay will include: orient to surroundings, keep bed in low position, maintain call bell within reach at all times, provide assistance with transfer out of bed and ambulation.  

## 2022-04-22 ENCOUNTER — Telehealth: Payer: Self-pay | Admitting: Psychiatry

## 2022-04-22 ENCOUNTER — Encounter: Payer: Self-pay | Admitting: Oncology

## 2022-04-22 DIAGNOSIS — Z634 Disappearance and death of family member: Secondary | ICD-10-CM

## 2022-04-22 MED ORDER — ALPRAZOLAM 0.25 MG PO TABS: 0.2500 mg | ORAL_TABLET | Freq: Every day | ORAL | 0 refills | Status: AC | PRN

## 2022-04-22 NOTE — Telephone Encounter (Signed)
[employer] called and said they haven't received information from your office they faxed over for approval for zolpidem 12.5 ER. They asked me to call and give you the direct number and reference number to the case so they can approve my zolpidem 12.5 ER.  [id number] (to direct prior auth. agent) Reference [id number]  Thank you I really appreciate it. [name]     Received the above MyChart message from patient.  Will route to Ms. Janett Billow CMA for assistance with it.

## 2022-04-22 NOTE — Telephone Encounter (Signed)
called insurance again and prior [name] is pending.

## 2022-04-22 NOTE — Telephone Encounter (Signed)
called patient verified insurance because when i called they state that eligibility could not be found.

## 2022-04-22 NOTE — Telephone Encounter (Signed)
Returned call to patient.  Provided grief support.  I have send Xanax 0.25 mg short supply to pharmacy for patient.  Discussed drug to drug interaction with hydrocodone.  Patient will avoid opioid medication the day that she takes the Xanax.  Reviewed [place] PMP [id number]

## 2022-04-22 NOTE — Telephone Encounter (Signed)
Noted  

## 2022-04-23 NOTE — Telephone Encounter (Signed)
the zolpidem tart er 12.'5mg'$  was approved until 02-15-23

## 2022-04-26 ENCOUNTER — Encounter: Payer: Self-pay | Admitting: Oncology

## 2022-05-03 ENCOUNTER — Other Ambulatory Visit (INDEPENDENT_AMBULATORY_CARE_PROVIDER_SITE_OTHER): Payer: Medicare Other

## 2022-05-03 ENCOUNTER — Other Ambulatory Visit: Payer: Self-pay

## 2022-05-03 ENCOUNTER — Encounter: Payer: Self-pay | Admitting: Internal Medicine

## 2022-05-03 ENCOUNTER — Ambulatory Visit (INDEPENDENT_AMBULATORY_CARE_PROVIDER_SITE_OTHER): Payer: Medicare Other | Admitting: Internal Medicine

## 2022-05-03 VITALS — BP 116/68 | HR 100 | Ht 67.0 in | Wt 144.0 lb

## 2022-05-03 DIAGNOSIS — K219 Gastro-esophageal reflux disease without esophagitis: Secondary | ICD-10-CM

## 2022-05-03 DIAGNOSIS — Z9884 Bariatric surgery status: Secondary | ICD-10-CM | POA: Diagnosis not present

## 2022-05-03 DIAGNOSIS — R197 Diarrhea, unspecified: Secondary | ICD-10-CM | POA: Diagnosis not present

## 2022-05-03 DIAGNOSIS — R7989 Other specified abnormal findings of blood chemistry: Secondary | ICD-10-CM

## 2022-05-03 LAB — VITAMIN D 25 HYDROXY (VIT D DEFICIENCY, FRACTURES): VITD: 7.57 ng/mL — ABNORMAL LOW (ref 30.00–100.00)

## 2022-05-03 LAB — VITAMIN B12: Vitamin B-12: 121 pg/mL — ABNORMAL LOW (ref 211–911)

## 2022-05-03 MED ORDER — PANTOPRAZOLE SODIUM 40 MG PO TBEC: 40.0000 mg | DELAYED_RELEASE_TABLET | Freq: Two times a day (BID) | ORAL | 3 refills | Status: DC

## 2022-05-03 MED ORDER — COLESTIPOL HCL 1 G PO TABS: 2.0000 g | ORAL_TABLET | Freq: Two times a day (BID) | ORAL | 1 refills | Status: DC

## 2022-05-03 NOTE — Patient Instructions (Addendum)
Your provider has requested that you go to the basement level for lab work before leaving today. Press "B" on the elevator. The lab is located at the first door on the left as you exit the elevator.   You are schedule for follow up visit on 6/28//24 at 10:10 am  We have sent the following medications to your pharmacy for you to pick up at your convenience: Pantoprazole, Colestipol   _______________________________________________________  If your blood pressure at your visit was 140/90 or greater, please contact your primary care physician to follow up on this.  _______________________________________________________  If you are age 45 or older, your body mass index should be between 23-30. Your Body mass index is 22.55 kg/m. If this is out of the aforementioned range listed, please consider follow up with your Primary Care Provider.  If you are age 58 or younger, your body mass index should be between 19-25. Your Body mass index is 22.55 kg/m. If this is out of the aformentioned range listed, please consider follow up with your Primary Care Provider.   ________________________________________________________  The [place] providers would like to encourage you to use [facility] to communicate with providers for non-urgent requests or questions.  Due to long hold times on the telephone, sending your provider a message by [facility] may be a faster and more efficient way to get a response.  Please allow 48 business hours for a response.  Please remember that this is for non-urgent requests.  _______________________________________________________   Due to recent changes in healthcare laws, you may see the results of your imaging and laboratory studies on MyChart before your provider has had a chance to review them.  We understand that in some cases there may be results that are confusing or concerning to you. Not all laboratory results come back in the same time frame and the provider may be waiting  for multiple results in order to interpret others.  Please give [place] 48 hours in order for your provider to thoroughly review all the results before contacting the office for clarification of your results.    Thank you for entrusting me with your care and for choosing [facility], Dr. Christia Reading

## 2022-05-03 NOTE — Progress Notes (Signed)
05/03/2022 [name] OO:[id number] 06-Sep-1975  ASSESSMENT AND PLAN:   Diarrhea with weight loss, dyspepsia.  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  Her test to assess for pancreatic insufficiency was negative.  Patient did not submit her prior SIBO test because she was concerned about potential costs.  I did discuss that a good diet for her to follow after her gastric bypass surgery would be a diet that focuses on high-protein and high-fiber with low carbohydrate consumption.  Since patient has previously had a cholecystectomy in the past, we will give her a trial of colestipol to see if this helps with her diarrhea further.  Can consider empiric treatment of SIBO in the future if she is not able to get SIBO breath testing. - Will plan for SIBO breath testing. Patient will call insurance to see if this is covered. Can consider empiric treatment for SIBO in the future.  - Cont pantoprazole 40 mg BID. Refill - Check vitamin B12 and vitamin D levels - Continue Lomotil PRN - Encourage high protein, high fiber, and low carb diet - Start colestipol 2 mg BID - Declined a referral back to [facility] bariatrics at this time - RTC 3 months  Patient Care Team: Leonard Downing, MD as PCP - General (Family Medicine)  HISTORY OF PRESENT ILLNESS: 47 y.o. female with a past medical history of cardiomyopathy, type 2 diabetes, PTSD, iron deficiency anemia and others listed below presents for evaluation of weight loss, decreased appetite, diarrhea. 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  Interval History: Her reflux has been good. Diarrhea has been about the same. She is taking Lomotil to slow things down. She tries to avoid greasy foods and eat fibrous foods. It doesn't  really matter what she eats because she will have diarrhea. If she eats cheese/bread, she will have bad gas. If she eats vegetables, they will almost immediately come out in her stools. She goes to a pain clinic and takes hydrocodone, which will slow down her BMs if she takes too much. She stopped her Zenpep.   Wt Readings from Last 3 Encounters:  05/03/22 144 lb (65.3 kg)  04/21/22 140 lb (63.5 kg)  04/06/22 140 lb (63.5 kg)   Labs/Imaging/Endoscopies: 07/15/2021 Labs reviewed show iron 128, saturation slightly increased at 32, ferritin normal at 24, normal CBC without anemia or leukocytosis.  Negative HIV, A1c 5.6, folate normal at 7.7,  B12 very low at 80, normal thyroid at 1.4 sed rate normal. Patient did have a low ferritin 12/21/2019 iron was low normal at 30 with decreased saturations.  Labs 12/2021: Pancreatic fecal elastase was normal.  O&P was normal.  CBC and CMP were unremarkable.  Vitamin B12 was normal.  Vitamin D levels were low at 21   03/05/2021 CT abdomen pelvis with contrast for left-sided abdominal pain, nausea, emesis and diarrhea history of multiple surgeries evaluate for obstruction showed postsurgical changes of gastric bypass no evidence of bowel obstruction or inflammatory bowel changes no acute abnormality.  Focal fatty disposition of the liver status postcholecystectomy without biliary dilatation.  Normal pancreas  She also had unremarkable colon 2008 at [place] and EGD 2008 and 2015 unremarkable at [facility].  01/24/2018 colonoscopy and upper endoscopy At [facility] with Dr. Alice Reichert for functional diarrhea, fecal incontinence  Colonoscopy difficult due  to redundant colon, good bowel prep normal sphincter tone, normal colonoscopy nonbleeding internal hemorrhoids grade 1 biopsies unremarkable. Endoscopy shows evidence of Roux-en-Y gastrojejunostomy healthy-appearing anastomosis, normal examined jejunum  EGD 10/13/21: - Normal esophagus. Biopsied. - Roux-en-Y  gastrojejunostomy with gastrojejunal anastomosis characterized by healthy appearing mucosa. - A single gastric polyp. Resected and retrieved. - A single gastric polyp. Resected and retrieved. - Normal examined jejunum. Biopsied. Path: 1. Surgical [P], small bowel - SMALL INTESTINAL MUCOSA WITH NO SPECIFIC HISTOPATHOLOGIC CHANGES - NEGATIVE FOR INCREASED INTRAEPITHELIAL LYMPHOCYTES OR VILLOUS ARCHITECTURAL CHANGES 2. Surgical [P], gastric - GASTRIC ANTRAL MUCOSA WITH MILD NONSPECIFIC REACTIVE GASTROPATHY - HELICOBACTER PYLORI-LIKE ORGANISMS ARE NOT IDENTIFIED ON ROUTINE H&E STAIN 3. Surgical [P], nodular gastric mucosa - GASTRIC ANTRAL AND OXYNTIC MUCOSA WITH NONSPECIFIC HYPERPLASTIC CHANGES - NEGATIVE FOR INTESTINAL METAPLASIA OR DYSPLASIA 4. Surgical [P], esophageal - ESOPHAGEAL SQUAMOUS MUCOSA WITH VASCULAR CONGESTION, SQUAMOUS BALLOONING AND FEW INTRAEPITHELIAL EOSINOPHILS (UP TO 3/HIGH POWER FIELD), CONSISTENT WITH REFLUX ESOPHAGITIS   Current Medications:      Current Outpatient Medications (Analgesics):    acetaminophen (TYLENOL) 325 MG tablet, Take 650 mg by mouth every 4 (four) hours as needed for moderate pain.    AIMOVIG 140 MG/ML SOAJ, SMARTSIG:140 Milligram(s) SUB-Q Every 4 Weeks   HYDROcodone-acetaminophen (NORCO) 7.5-325 MG tablet, Take 1 tablet by mouth every 6 (six) hours as needed for moderate pain.   SUMAtriptan (IMITREX) 100 MG tablet, Take 100 mg by mouth every 2 (two) hours as needed for migraine or headache.   Current Outpatient Medications (Hematological):    cyanocobalamin (,VITAMIN B-12,) 1000 MCG/ML injection, Inject 1,000 mcg into the muscle every 30 (thirty) days.   Current Outpatient Medications (Other):    buPROPion (WELLBUTRIN XL) 150 MG 24 hr tablet, Take 2 tablets (300 mg total) by mouth daily.   diphenoxylate-atropine (LOMOTIL) 2.5-0.025 MG tablet, Take 1 tablet by mouth as needed.   hydrOXYzine (ATARAX) 25 MG tablet, TAKE 1/2 TO 1 TABLET(12.5 TO  25 MG) BY MOUTH TWICE DAILY AS NEEDED FOR ANXIETY   lamoTRIgine (LAMICTAL) 200 MG tablet, TAKE 1/2 TABLET(100 MG) BY MOUTH TWICE DAILY   Multiple Vitamins-Minerals (HM MULTIVITAMIN ADULT GUMMY) CHEW, Chew 2 tablets by mouth daily.   pantoprazole (PROTONIX) 40 MG tablet, Take 1 tablet (40 mg total) by mouth 2 (two) times daily.   topiramate (TOPAMAX) 50 MG tablet, Take 100 mg by mouth 2 (two) times daily.   Vilazodone HCl (VIIBRYD) 40 MG TABS, Take 1 tablet (40 mg total) by mouth daily.   Vitamin D, Ergocalciferol, (DRISDOL) 1.25 MG (50000 UNIT) CAPS capsule, Take 1 capsule (50,000 Units total) by mouth daily. TAKE 1 CAPSULE BY MOUTH DAILY FOR 14 DAYS THEN TAKE 1 CAPSULE BY MOUTH 1 TIME WEEKLY   zolpidem (AMBIEN CR) 12.5 MG CR tablet, Take 1 tablet (12.5 mg total) by mouth at bedtime.  PHYSICAL EXAM: BP 116/68   Pulse 100   Ht 5\' 7"  (1.702 m)   Wt 144 lb (65.3 kg)   LMP  (LMP Unknown)   SpO2 100%   BMI 22.55 kg/m  General:   Pleasant, well developed female in no acute distress Head:   Normocephalic and atraumatic. Eyes:  sclerae anicteric,conjunctive pink  Heart:   regular rate and rhythm Pulm:  Clear anteriorly; no wheezing Abdomen:   Soft, Non-distended AB, Active bowel sounds. Non-tender. Without guarding and Without rebound, No organomegaly appreciated. Rectal: Not evaluated Extremities:  Without edema. Msk: Symmetrical without gross deformities. Peripheral pulses intact.  Neurologic:  Alert and  oriented x4;  No focal deficits.  Skin:   Dry and intact without significant lesions or rashes. Psychiatric:  Cooperative. Normal mood and affect.  Sharyn Creamer, MD 10:17 AM  I spent 45 minutes of time, including in depth chart review, independent review of results as outlined above, communicating results with the patient directly, face-to-face time with the patient, coordinating care, ordering studies and medications as appropriate, and documentation.

## 2022-05-05 ENCOUNTER — Encounter: Payer: Self-pay | Admitting: Psychiatry

## 2022-05-05 ENCOUNTER — Ambulatory Visit (INDEPENDENT_AMBULATORY_CARE_PROVIDER_SITE_OTHER): Payer: Medicare Other | Admitting: Psychiatry

## 2022-05-05 VITALS — BP 118/82 | HR 62 | Temp 98.3°F | Ht 67.0 in | Wt 147.2 lb

## 2022-05-05 DIAGNOSIS — F410 Panic disorder [episodic paroxysmal anxiety] without agoraphobia: Secondary | ICD-10-CM | POA: Diagnosis not present

## 2022-05-05 DIAGNOSIS — F5101 Primary insomnia: Secondary | ICD-10-CM | POA: Diagnosis not present

## 2022-05-05 DIAGNOSIS — G4710 Hypersomnia, unspecified: Secondary | ICD-10-CM | POA: Insufficient documentation

## 2022-05-05 DIAGNOSIS — F4310 Post-traumatic stress disorder, unspecified: Secondary | ICD-10-CM

## 2022-05-05 DIAGNOSIS — F3341 Major depressive disorder, recurrent, in partial remission: Secondary | ICD-10-CM

## 2022-05-05 MED ORDER — BUPROPION HCL ER (XL) 150 MG PO TB24: 300.0000 mg | ORAL_TABLET | Freq: Every day | ORAL | 2 refills | Status: DC

## 2022-05-05 MED ORDER — BUPROPION HCL ER (XL) 150 MG PO TB24: 150.0000 mg | ORAL_TABLET | Freq: Every day | ORAL | 2 refills | Status: DC

## 2022-05-05 NOTE — Progress Notes (Unsigned)
BH MD/PA/NP OP Progress Note  05/05/2022 9:00 AM [name]  MRN:  [id number]  Chief Complaint:  Chief Complaint  Patient presents with   Follow-up   Medication Refill   Anxiety   Depression   Insomnia   HPI: [name] is a 47 year old Caucasian female, divorced, lives in [place], has a history of MDD, panic disorder, PTSD, primary insomnia, history of seizure disorder, chronic pain, history of gastric bypass (2009), small bowel obstruction was evaluated in the office today.  Patient is currently grieving the loss of her daughter's grandmother who recently passed away due to cancer.  Patient also has multiple other stressors including her father who has terminal cancer, supporting her mother as well as supporting her ex-partner who has MS.  Patient reports she however is not interested in psychotherapy, has reached out to her pastor.  Her pastor has moved away to another place and since that has been stressful for her as well since she is not able to talk about her problems like she could in the past.  She however reports she still can text and call.  Patient reports she is currently on a lower dose of Wellbutrin as discussed at previous visit.  She is tolerating it well.  She however does report tired and fatigued in the morning.  In spite of getting a good night of rest she continues to feel tired.  She however has multiple medical issues including chronic pain going on.  She however is interested in referral for sleep consultation, possible sleep study.  Patient is compliant on all her medications.  Denies any other side effects other than the fatigue.  Unknown if fatigue is also due to her medications.  Patient denies any suicidality, homicidality or perceptual disturbances.  Patient denies any other concerns today.  Visit Diagnosis:    ICD-10-CM   1. Recurrent major depressive disorder, in partial remission ([place])  F33.41     2. Panic disorder  F41.0     3.  PTSD (post-traumatic stress disorder)  F43.10 buPROPion (WELLBUTRIN XL) 150 MG 24 hr tablet    4. Primary insomnia  F51.01 Ambulatory referral to Pulmonology    5. Hypersomnia  G47.10 Ambulatory referral to Pulmonology      Past Psychiatric History: I have reviewed past psychiatric history from progress note on 05/09/2017.  Past Medical History:  Past Medical History:  Diagnosis Date   Abnormal ECG    Anemia    Anxiety    Arthritis    Asthma    B12 deficiency    Cardiomyopathy ([place])    Chronic abdominal pain    Complication of anesthesia    difficulty to get sedated during endoscopy   COVID-19 01/18/2019   DDD (degenerative disc disease), lumbar    Depression    Diabetes mellitus without complication ([place])    ONLY gestational diabetes, does not have chronic diabetes   Dysrhythmia    GERD (gastroesophageal reflux disease)    Iron deficiency    Migraine headache    Neuropathy    Obesity    gastric bypass 2009   Ovarian cyst    Pneumonia    within past five years   PTSD (post-traumatic stress disorder)    PTSD (post-traumatic stress disorder)    Rh negative status during pregnancy    Seizures ([place])    post-gestational   Small bowel obstruction ([place])     Past Surgical History:  Procedure Laterality Date   ABDOMINAL HYSTERECTOMY  BILATERAL SALPINGECTOMY Bilateral 09/29/2015   Procedure: BILATERAL SALPINGECTOMY;  Surgeon: Benjaman Kindler, MD;  Location: [facility] ORS;  Service: Gynecology;  Laterality: Bilateral;   bowel obstruction  2011   CHOLECYSTECTOMY     COLONOSCOPY WITH PROPOFOL N/A 01/24/2018   Procedure: COLONOSCOPY WITH PROPOFOL;  Surgeon: Toledo, Benay Pike, MD;  Location: [facility] END[facility]COPY;  Service: Gastroenterology;  Laterality: N/A;   DILATION AND CURETTAGE OF UTERUS     ESOPHAGOGASTRODUODENOSCOPY     ESOPHAGOGASTRODUODENOSCOPY (EGD) WITH PROPOFOL N/A 01/24/2018   Procedure: ESOPHAGOGASTRODUODENOSCOPY (EGD) WITH PROPOFOL;  Surgeon: Toledo, Benay Pike, MD;   Location: [facility] END[facility];  Service: Gastroenterology;  Laterality: N/A;   GASTRIC BYPASS  2010   gi bleed  2009   Surgery-was in ICU for three weeks   HERNIA REPAIR     OVARIAN CYST REMOVAL Left 09/29/2015   Procedure: OVARIAN CYSTECTOMY;  Surgeon: Benjaman Kindler, MD;  Location: [facility] ORS;  Service: Gynecology;  Laterality: Left;   ROUX-EN-Y PROCEDURE     VAGINAL HYSTERECTOMY N/A 09/29/2015   Procedure: HYSTERECTOMY VAGINAL;  Surgeon: Benjaman Kindler, MD;  Location: [facility] ORS;  Service: Gynecology;  Laterality: N/A;    Family Psychiatric History: I have reviewed family psychiatric history from progress note on 05/09/2017.  Family History:  Family History  Problem Relation Age of Onset   Arthritis/Rheumatoid Mother    Heart block Mother    Clotting disorder Mother    Osteoporosis Mother    Heart attack Mother    Anxiety disorder Mother    Depression Mother    Colon polyps Mother    Stomach cancer Father 52   Heart disease Father    Heart attack Father    Depression Sister    Anxiety disorder Sister    Colon polyps Sister    Colon cancer Neg Hx    Esophageal cancer Neg Hx    Pancreatic cancer Neg Hx     Social History: I have reviewed social history from progress note on 05/09/2017. Social History   Socioeconomic History   Marital status: Single    Spouse name: Not on file   Number of children: Not on file   Years of education: Not on file   Highest education level: Not on file  Occupational History   Not on file  Tobacco Use   Smoking status: Never   Smokeless tobacco: Never  Vaping Use   Vaping Use: Never used  Substance and Sexual Activity   Alcohol use: No    Alcohol/week: 0.0 standard drinks of alcohol   Drug use: No   Sexual activity: Yes    Partners: Male    Birth control/protection: Surgical  Other Topics Concern   Not on file  Social History Narrative   Lives with mother, daughter, son, and fiance. Pets, dogs, in home.   Social Determinants of  Health   Financial Resource Strain: Not on file  Food Insecurity: Not on file  Transportation Needs: Not on file  Physical Activity: Not on file  Stress: Not on file  Social Connections: Not on file    Allergies:  Allergies  Allergen Reactions   Amoxicillin Anaphylaxis, Hives, Shortness Of Breath, Swelling and Rash    Has patient had a PCN reaction causing immediate rash, facial/tongue/throat swelling, SOB or lightheadedness with hypotension: Yes Has patient had a PCN reaction causing severe rash involving mucus membranes or skin necrosis: Yes Has patient had a PCN reaction that required hospitalization No Has patient had a PCN reaction occurring within the last 10  years: No If all of the above answers are "NO", then may proceed with Cephalosporin use. Other reaction(s): ANAPHYLAXIS Other reaction(s): ANAPHYLAX   Penicillins Anaphylaxis, Swelling, Rash, Shortness Of Breath and Hives    Has patient had a PCN reaction causing immediate rash, facial/tongue/throat swelling, SOB or lightheadedness with hypotension: Yes Has patient had a PCN reaction causing severe rash involving mucus membranes or skin necrosis: Yes Has patient had a PCN reaction that required hospitalization No Has patient had a PCN reaction occurring within the last 10 years: No If all of the above answers are "NO", then may proceed with Cephalosporin use.  Other reaction(s): ANAPHYLAXIS Other reaction(s): ANAPHYLAXIS Has patient had a PCN reaction causing immediate rash, facial/tongue/throat swelling, SOB or lightheadedness with hypotension: Yes Has patient had a PCN reaction causing severe rash involving mucus membranes or skin necrosis: Yes Has patient had a PCN reaction that required hospitalization No Has patient had a PCN reaction occurring within the last 10 years: No If all of the above answers are "NO", then may proceed with Cephalosporin use. Other reaction(s): ANAPHYLAXIS Other reaction(s): ANAPHYLAX Has  patient had a PCN reaction causing immediate rash, facial/tongue/throat swelling, SOB or lightheadedness with hypotension: Yes Has patient had a PCN reaction causing severe rash involving mucus membranes or skin necrosis: Yes Has patient had a PCN reaction that required hospitalization No Has patient had a PCN reaction occurring within the last 10 years: No If all of the above answers are "NO", then may proceed with Cephalosporin use. Other reaction(s): ANAPHYLAXIS Other reaction(s): ANAPHYLAXIS    Morphine Other (See Comments)    Muscle spasms   Sulfa Antibiotics Nausea Only    Other reaction(s): VOMITING Other reaction(s): VOMITING Other reaction(s): VOMITING    Metabolic Disorder Labs: Lab Results  Component Value Date   HGBA1C 5.5 12/11/2019   MPG 111 12/11/2019   Lab Results  Component Value Date   PROLACTIN 12.1 12/11/2019   Lab Results  Component Value Date   CHOL 162 12/11/2019   TRIG 51 12/11/2019   HDL 87 12/11/2019   CHOLHDL 1.9 12/11/2019   VLDL 10 12/11/2019   LDLCALC 65 12/11/2019   Lab Results  Component Value Date   TSH 1.886 12/11/2019   TSH  05/13/2009    3.163 (NOTE) *** Please note change in reference ranges for ages 74W to 47Y. *** ***Test methodology is 3rd generation TSH***    Therapeutic Level Labs: No results found for: "LITHIUM" No results found for: "VALPROATE" No results found for: "CBMZ"  Current Medications: Current Outpatient Medications  Medication Sig Dispense Refill   acetaminophen (TYLENOL) 325 MG tablet Take 650 mg by mouth every 4 (four) hours as needed for moderate pain.      AIMOVIG 140 MG/ML SOAJ SMARTSIG:140 Milligram(s) SUB-Q Every 4 Weeks     ALPRAZolam (XANAX) 0.25 MG tablet TAKE 1 TABLET BY MOUTH EVERY DAY AS NEEDED FOR UP TO 3 DAYS FOR ANXIETY     colestipol (COLESTID) 1 g tablet Take 2 tablets (2 g total) by mouth 2 (two) times daily. 180 tablet 1   cyanocobalamin (,VITAMIN B-12,) 1000 MCG/ML injection Inject 1,000  mcg into the muscle every 30 (thirty) days.      diphenoxylate-atropine (LOMOTIL) 2.5-0.025 MG tablet Take 1 tablet by mouth as needed. 60 tablet 0   HYDROcodone-acetaminophen (NORCO) 7.5-325 MG tablet Take 1 tablet by mouth every 6 (six) hours as needed for moderate pain.     hydrOXYzine (ATARAX) 25 MG tablet TAKE 1/2 TO  1 TABLET(12.5 TO 25 MG) BY MOUTH TWICE DAILY AS NEEDED FOR ANXIETY 60 tablet 2   lamoTRIgine (LAMICTAL) 200 MG tablet TAKE 1/2 TABLET(100 MG) BY MOUTH TWICE DAILY 90 tablet 0   Multiple Vitamins-Minerals (HM MULTIVITAMIN ADULT GUMMY) CHEW Chew 2 tablets by mouth daily.     pantoprazole (PROTONIX) 40 MG tablet Take 1 tablet (40 mg total) by mouth 2 (two) times daily. 30 tablet 3   SUMAtriptan (IMITREX) 100 MG tablet Take 100 mg by mouth every 2 (two) hours as needed for migraine or headache.      topiramate (TOPAMAX) 50 MG tablet Take 100 mg by mouth 2 (two) times daily.     Vilazodone HCl (VIIBRYD) 40 MG TABS Take 1 tablet (40 mg total) by mouth daily. 90 tablet 0   Vitamin D, Ergocalciferol, (DRISDOL) 1.25 MG (50000 UNIT) CAPS capsule Take 1 capsule (50,000 Units total) by mouth daily. TAKE 1 CAPSULE BY MOUTH DAILY FOR 14 DAYS THEN TAKE 1 CAPSULE BY MOUTH 1 TIME WEEKLY 60 capsule 0   zolpidem (AMBIEN CR) 12.5 MG CR tablet Take 1 tablet (12.5 mg total) by mouth at bedtime. 30 tablet 1   buPROPion (WELLBUTRIN XL) 150 MG 24 hr tablet Take 2 tablets (300 mg total) by mouth daily. 60 tablet 2   No current facility-administered medications for this visit.     Musculoskeletal: Strength & Muscle Tone: within normal limits Gait & Station: normal Patient leans: N/A  Psychiatric Specialty Exam: Review of Systems  Constitutional:  Positive for fatigue.  Musculoskeletal:  Positive for arthralgias and back pain.  Psychiatric/Behavioral:  Positive for dysphoric mood and sleep disturbance. The patient is nervous/anxious.        Grief-improving  All other systems reviewed and are  negative.   Blood pressure 118/82, pulse 62, temperature 98.3 F (36.8 C), temperature source Temporal, height 5\' 7"  (1.702 m), weight 147 lb 3.2 oz (66.8 kg), SpO2 99 %.Body mass index is 23.05 kg/m.  General Appearance: Casual  Eye Contact:  Fair  Speech:  Clear and Coherent  Volume:  Normal  Mood:  Anxious and Depressed improving, grief-improving  Affect:  Congruent  Thought Process:  Goal Directed and Descriptions of Associations: Intact  Orientation:  Full (Time, Place, and Person)  Thought Content: Logical   Suicidal Thoughts:  No  Homicidal Thoughts:  No  Memory:  Immediate;   Fair Recent;   Fair Remote;   Fair  Judgement:  Fair  Insight:  Fair  Psychomotor Activity:  Normal  Concentration:  Concentration: Fair and Attention Span: Fair  Recall:  [employer] of Knowledge: Fair  Language: Fair  Akathisia:  No  Handed:  Right  AIMS (if indicated): not done  Assets:  Communication Skills Desire for Improvement Social Support  ADL's:  Intact  Cognition: WNL  Sleep:   Fair at night on the medication however reports not rested in the morning.  Has extreme fatigue, tiredness.   Screenings: AIMS    Flowsheet Row Video Visit from 06/16/2021 in [place] Total Score 0      GAD-7    [place] Office Visit from 03/19/2022 in [place] Office Visit from 02/25/2022 in [place] Video Visit from 06/16/2021 in [place]  Total GAD-7 Score 3 7 7       PHQ2-9    [place] Office Visit from 03/19/2022 in [facility]  [facility] Office Visit from 02/25/2022 in [place] Video Visit from 01/19/2022 in [facility] Video Visit from 10/06/2021 in [facility]  Video Visit from 06/16/2021 in [place]  PHQ-2 Total Score 2 2 0 1 1  PHQ-9 Total Score 4 6 -- 9 6      [place] Office Visit from 03/19/2022 in [place] Office Visit from 02/25/2022 in [place] Video Visit from 01/19/2022 in [facility]  C-SSRS RISK CATEGORY No Risk No Risk No Risk        Assessment and Plan: [name] is a 47 year old Caucasian female who has a history of depression, anxiety, insomnia was evaluated in office today.  Patient with multiple medical problems including chronic pain, vitamin D, vitamin B12 deficiency, fatigue, grief, currently struggling with excessive tiredness and fatigue during the day although mood symptoms and sleep are improving.  Plan  MDD in partial remission Continue Wellbutrin XL 300 mg p.o. daily. Continue Viibryd 40 mg p.o. daily   PTSD-stable Viibryd 40 mg p.o. daily Hydroxyzine 25 mg p.o. nightly as needed  Panic disorder-stable Hydroxyzine 25 mg p.o. nightly as needed  Insomnia-improving Continue zolpidem extended release 12.5 mg p.o. nightly Reviewed [place] PMP AWARxE   For hypersomnia/fatigue-referred to sleep provider-ambulatory referral to pulmonology.  Patient may need sleep study.  Discussed referral for CBT, patient is not interested.  Follow-up in clinic in 2 months or sooner if needed.   Collaboration of Care: Collaboration of Care: {BH OP Collaboration of Care:[id number]}  Patient/Guardian was advised Release of Information must be obtained prior to any record release in order to collaborate their care with an outside provider. Patient/Guardian was advised if they have not already done so to contact the registration department to sign all necessary forms in order for [place] to release information regarding their care.   Consent: Patient/Guardian  gives verbal consent for treatment and assignment of benefits for services provided during this visit. Patient/Guardian expressed understanding and agreed to proceed.    Ursula Alert, MD 05/05/2022, 9:00 AM

## 2022-05-05 NOTE — Progress Notes (Signed)
Hi [name], please let the patient know that her vitamin D and vitamin B12 levels continue to be low. Her vitamin B12 has previously responded to more frequent B12 injections so I think she should be on at least vitamin B12 1000 mcg twice weekly. For her low vitamin D levels, I think that she should switch from the ergocalciferol formulation to the calcidiol formulation with 40 mcg QD. We will plan to recheck her vitamin D and vitamin B12 levels during her next clinic visit in 3 months.

## 2022-05-06 ENCOUNTER — Ambulatory Visit: Payer: Medicare Other | Admitting: Pain Medicine

## 2022-05-07 ENCOUNTER — Other Ambulatory Visit: Payer: Self-pay

## 2022-05-07 ENCOUNTER — Encounter: Payer: Self-pay | Admitting: Internal Medicine

## 2022-05-07 MED ORDER — NONFORMULARY OR COMPOUNDED ITEM: 3 refills | Status: DC

## 2022-05-07 MED ORDER — CYANOCOBALAMIN 1000 MCG/ML IJ SOLN: INTRAMUSCULAR | 3 refills | Status: DC

## 2022-05-07 MED ORDER — PANTOPRAZOLE SODIUM 40 MG PO TBEC: 40.0000 mg | DELAYED_RELEASE_TABLET | Freq: Two times a day (BID) | ORAL | 3 refills | Status: DC

## 2022-05-10 ENCOUNTER — Telehealth: Payer: Self-pay

## 2022-05-10 NOTE — Telephone Encounter (Signed)
PA request received via CMM for Cyanocobalamin 1000MCG/ML solution  PA submitted to [facility] and has been DENIED due to:   The Medicare rule in the Prescription Drug Manual (Chapter 6, Section 20.1) says prescription vitamins and mineral products, except prenatal vitamins for members who are pregnant and fluoride preparations, are excluded from Medicare Part D coverage. Your drug is a prescription vitamin and/or mineral. Per Medicare rules, it is not covered.

## 2022-05-10 NOTE — Telephone Encounter (Signed)
This is typically set in stone due to it being considered "otc" for being a supplemental vitamin.

## 2022-05-11 ENCOUNTER — Other Ambulatory Visit: Payer: Self-pay | Admitting: Internal Medicine

## 2022-05-11 DIAGNOSIS — E559 Vitamin D deficiency, unspecified: Secondary | ICD-10-CM

## 2022-05-11 MED ORDER — ERGOCALCIFEROL 200 MCG/ML PO SOLN: 1600.0000 [IU] | Freq: Every day | ORAL | 1 refills | Status: DC

## 2022-05-11 NOTE — Telephone Encounter (Signed)
Spoke with patient. She uses a "GoodRx" card for the B12 which brings the price down to something she can afford. The card does not bring down the cost on the Vitamin D2 enough unfortunately. It will cost bout $120 which is not sustainable for her.  So she can get the B12. She cannot get the D2.

## 2022-05-13 MED ORDER — VITAMIN D (ERGOCALCIFEROL) 1.25 MG (50000 UNIT) PO CAPS: 50000.0000 [IU] | ORAL_CAPSULE | Freq: Every day | ORAL | 3 refills | Status: DC

## 2022-05-13 NOTE — Telephone Encounter (Signed)
Attempted to reach patient. Her vm is full and I am not able to leave a vm at this time. MyChart message sent to patient with recommendations.

## 2022-05-20 ENCOUNTER — Encounter: Payer: Self-pay | Admitting: Oncology

## 2022-05-20 ENCOUNTER — Telehealth: Payer: Self-pay | Admitting: Pharmacy Technician

## 2022-05-20 ENCOUNTER — Other Ambulatory Visit (HOSPITAL_COMMUNITY): Payer: Self-pay

## 2022-05-20 NOTE — Telephone Encounter (Signed)
Patient Advocate Encounter  Received notification [employer] that prior authorization for PANTOPRAZOLE  is required.   PA NOT submitted on 4.4.24 Key [id number] Status is pending Last fill on 3.22.24. Next fill is on 5.29.24

## 2022-06-01 ENCOUNTER — Telehealth: Payer: Self-pay | Admitting: Psychiatry

## 2022-06-01 DIAGNOSIS — F5101 Primary insomnia: Secondary | ICD-10-CM

## 2022-06-01 MED ORDER — ZOLPIDEM TARTRATE ER 12.5 MG PO TBCR: 12.5000 mg | EXTENDED_RELEASE_TABLET | Freq: Every day | ORAL | 3 refills | Status: DC

## 2022-06-01 NOTE — Telephone Encounter (Signed)
I have sent Ambien to pharmacy with date specified.

## 2022-06-02 ENCOUNTER — Encounter: Payer: Self-pay | Admitting: Internal Medicine

## 2022-06-04 ENCOUNTER — Other Ambulatory Visit: Payer: Self-pay

## 2022-06-04 ENCOUNTER — Encounter: Payer: Self-pay | Admitting: Internal Medicine

## 2022-06-04 ENCOUNTER — Other Ambulatory Visit: Payer: Self-pay | Admitting: Psychiatry

## 2022-06-04 ENCOUNTER — Telehealth: Payer: Self-pay

## 2022-06-04 DIAGNOSIS — F4310 Post-traumatic stress disorder, unspecified: Secondary | ICD-10-CM

## 2022-06-04 MED ORDER — RIFAXIMIN 550 MG PO TABS: 550.0000 mg | ORAL_TABLET | Freq: Three times a day (TID) | ORAL | 0 refills | Status: AC

## 2022-06-04 NOTE — Progress Notes (Signed)
Received results of the patient's SIBO breath test, which was collected on 05/18/22 and reported on 05/31/22. 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 Will have records scanned into her chart. Thus will proceed with treatment with rifaximin 550 mg TID for 14 days.   [name], please let her know the results of her SIBO breath test and send her the course of antibiotics if she is agreeable. Please put indication for rifaximin as IBS-D and SIBO.

## 2022-06-04 NOTE — Telephone Encounter (Signed)
Called the patient to notify of the SIBO breath test. Suggestive of SIBO and Xifaxan treatment is recommended. Prescription to her pharmacy. She is aware that insurance may not cover.

## 2022-06-07 ENCOUNTER — Encounter: Payer: Self-pay | Admitting: Internal Medicine

## 2022-06-07 ENCOUNTER — Telehealth: Payer: Self-pay

## 2022-06-07 NOTE — Telephone Encounter (Signed)
PA request received via fax for additional information for Xifaxan  PA request filled out and faxed back to [facility] and is pending determination.  Faxed to - [phone]

## 2022-06-08 ENCOUNTER — Other Ambulatory Visit: Payer: Self-pay | Admitting: Psychiatry

## 2022-06-08 DIAGNOSIS — F410 Panic disorder [episodic paroxysmal anxiety] without agoraphobia: Secondary | ICD-10-CM

## 2022-06-08 NOTE — Telephone Encounter (Signed)
PA has been APPROVED through 02/15/2023. Approval letter has  been attached to patients documents

## 2022-06-08 NOTE — Telephone Encounter (Signed)
Forms received, completed, and sent back to plan- 06/07/2022

## 2022-06-11 ENCOUNTER — Other Ambulatory Visit: Payer: Self-pay | Admitting: Internal Medicine

## 2022-06-15 ENCOUNTER — Encounter: Payer: Self-pay | Admitting: Internal Medicine

## 2022-07-05 ENCOUNTER — Ambulatory Visit (INDEPENDENT_AMBULATORY_CARE_PROVIDER_SITE_OTHER): Payer: Medicare Other | Admitting: Psychiatry

## 2022-07-05 ENCOUNTER — Encounter: Payer: Self-pay | Admitting: Psychiatry

## 2022-07-05 VITALS — BP 120/71 | HR 67 | Temp 98.2°F | Ht 67.0 in | Wt 147.6 lb

## 2022-07-05 DIAGNOSIS — F5101 Primary insomnia: Secondary | ICD-10-CM

## 2022-07-05 DIAGNOSIS — F331 Major depressive disorder, recurrent, moderate: Secondary | ICD-10-CM

## 2022-07-05 DIAGNOSIS — F4310 Post-traumatic stress disorder, unspecified: Secondary | ICD-10-CM | POA: Diagnosis not present

## 2022-07-05 DIAGNOSIS — F410 Panic disorder [episodic paroxysmal anxiety] without agoraphobia: Secondary | ICD-10-CM | POA: Diagnosis not present

## 2022-07-05 MED ORDER — LAMOTRIGINE 25 MG PO TABS: 25.0000 mg | ORAL_TABLET | Freq: Every day | ORAL | 2 refills | Status: DC

## 2022-07-05 MED ORDER — HYDROXYZINE HCL 50 MG PO TABS: 50.0000 mg | ORAL_TABLET | Freq: Two times a day (BID) | ORAL | 2 refills | Status: DC | PRN

## 2022-07-05 MED ORDER — VILAZODONE HCL 40 MG PO TABS: 40.0000 mg | ORAL_TABLET | Freq: Every day | ORAL | 1 refills | Status: DC

## 2022-07-05 MED ORDER — LAMOTRIGINE 200 MG PO TABS: ORAL_TABLET | ORAL | 1 refills | Status: DC
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
## 2022-07-05 NOTE — Patient Instructions (Signed)
Vraylar  Rexulti Latuda Abilify

## 2022-07-30 ENCOUNTER — Other Ambulatory Visit: Payer: Self-pay | Admitting: Psychiatry

## 2022-07-30 DIAGNOSIS — F4310 Post-traumatic stress disorder, unspecified: Secondary | ICD-10-CM

## 2022-08-05 ENCOUNTER — Other Ambulatory Visit: Payer: Self-pay | Admitting: Psychiatry

## 2022-08-05 DIAGNOSIS — F5101 Primary insomnia: Secondary | ICD-10-CM
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
## 2022-08-13 ENCOUNTER — Ambulatory Visit: Payer: Medicare Other | Admitting: Internal Medicine

## 2022-08-16 ENCOUNTER — Other Ambulatory Visit: Payer: Self-pay | Admitting: Student

## 2022-08-16 DIAGNOSIS — G629 Polyneuropathy, unspecified: Secondary | ICD-10-CM

## 2022-08-16 DIAGNOSIS — R220 Localized swelling, mass and lump, head: Secondary | ICD-10-CM

## 2022-08-19 ENCOUNTER — Other Ambulatory Visit: Payer: Self-pay | Admitting: Psychiatry

## 2022-08-19 DIAGNOSIS — F4310 Post-traumatic stress disorder, unspecified: Secondary | ICD-10-CM

## 2022-08-19 DIAGNOSIS — F410 Panic disorder [episodic paroxysmal anxiety] without agoraphobia: Secondary | ICD-10-CM

## 2022-08-20 ENCOUNTER — Other Ambulatory Visit: Payer: Self-pay | Admitting: Psychiatry

## 2022-08-20 DIAGNOSIS — F331 Major depressive disorder, recurrent, moderate: Secondary | ICD-10-CM

## 2022-08-23 ENCOUNTER — Other Ambulatory Visit: Payer: Self-pay | Admitting: Psychiatry

## 2022-08-23 DIAGNOSIS — F4310 Post-traumatic stress disorder, unspecified: Secondary | ICD-10-CM

## 2022-08-23 DIAGNOSIS — F410 Panic disorder [episodic paroxysmal anxiety] without agoraphobia: Secondary | ICD-10-CM

## 2022-08-24 ENCOUNTER — Telehealth: Payer: Medicare Other | Admitting: Psychiatry

## 2022-08-30 ENCOUNTER — Telehealth: Payer: Self-pay

## 2022-08-30 DIAGNOSIS — F5101 Primary insomnia: Secondary | ICD-10-CM

## 2022-08-30 MED ORDER — ZOLPIDEM TARTRATE ER 12.5 MG PO TBCR: 12.5000 mg | EXTENDED_RELEASE_TABLET | Freq: Every day | ORAL | 1 refills | Status: DC

## 2022-08-30 NOTE — Telephone Encounter (Signed)
I have sent Ambien to [facility] pharmacy.  Patient is not currently due for any other medication refills, please let patient know.

## 2022-08-30 NOTE — Telephone Encounter (Signed)
Patient called requesting a refill for all of her medications she would like for all to be sent to  [id number] [place], [place] - [address] AT [facility] [address] Phone: [phone]  Fax: [phone]      She would like for her Ambien sent to   [facility] [address] ([address]), [place] - [address] Phone: [phone]  Fax: [phone]

## 2022-09-06 ENCOUNTER — Telehealth: Payer: Self-pay

## 2022-09-06 DIAGNOSIS — F331 Major depressive disorder, recurrent, moderate: Secondary | ICD-10-CM

## 2022-09-06 DIAGNOSIS — F4310 Post-traumatic stress disorder, unspecified: Secondary | ICD-10-CM

## 2022-09-06 DIAGNOSIS — F410 Panic disorder [episodic paroxysmal anxiety] without agoraphobia: Secondary | ICD-10-CM

## 2022-09-06 MED ORDER — LAMOTRIGINE 200 MG PO TABS: ORAL_TABLET | ORAL | 1 refills | Status: DC

## 2022-09-06 MED ORDER — LAMOTRIGINE 25 MG PO TABS: 25.0000 mg | ORAL_TABLET | Freq: Every day | ORAL | 2 refills | Status: DC

## 2022-09-06 MED ORDER — VILAZODONE HCL 40 MG PO TABS: 40.0000 mg | ORAL_TABLET | Freq: Every day | ORAL | 1 refills | Status: DC

## 2022-09-06 NOTE — Telephone Encounter (Signed)
called pharamcy he stated that the last rx that they have is from 03-19-22 and patient last filled on 05-26-22. to him to look to see if a rx was on hold that was sent on 5-20. according to the pharmacist they don't have that rx. last one was from 03-19-22

## 2022-09-06 NOTE — Telephone Encounter (Signed)
called pharmacy ; the tech states that they dont have a rx for 07-05-22. states patient last picked up was 05-23-22 and it no refills.

## 2022-09-06 NOTE — Telephone Encounter (Signed)
pt was notified but she states that it is also a issues with the lamotrigine 200mg 

## 2022-09-06 NOTE — Telephone Encounter (Signed)
I have sent Viibryd to pharmacy at [facility].

## 2022-09-06 NOTE — Telephone Encounter (Signed)
pt called left message that she needs refill on the viibryd 40mg .

## 2022-09-06 NOTE — Telephone Encounter (Signed)
Pt.notified

## 2022-09-06 NOTE — Telephone Encounter (Signed)
I have sent lamotrigine 200 mg and 25 mg to pharmacy at [facility].

## 2022-10-06 ENCOUNTER — Telehealth: Payer: Medicare Other | Admitting: Psychiatry

## 2022-10-06 ENCOUNTER — Encounter: Payer: Self-pay | Admitting: Psychiatry

## 2022-10-06 DIAGNOSIS — F5101 Primary insomnia: Secondary | ICD-10-CM

## 2022-10-06 DIAGNOSIS — F410 Panic disorder [episodic paroxysmal anxiety] without agoraphobia: Secondary | ICD-10-CM | POA: Diagnosis not present

## 2022-10-06 DIAGNOSIS — F3342 Major depressive disorder, recurrent, in full remission: Secondary | ICD-10-CM

## 2022-10-06 DIAGNOSIS — F4310 Post-traumatic stress disorder, unspecified: Secondary | ICD-10-CM | POA: Diagnosis not present

## 2022-10-06 NOTE — Progress Notes (Signed)
Virtual Visit via Video Note  I connected with [name] on 10/06/22 at  9:00 AM EDT by a video enabled telemedicine application and verified that I am speaking with the correct person using two identifiers.  Location Provider Location : [facility] Patient Location : Car  Participants: Patient , Provider    I discussed the limitations of evaluation and management by telemedicine and the availability of in person appointments. The patient expressed understanding and agreed to proceed.   I discussed the assessment and treatment plan with the patient. The patient was provided an opportunity to ask questions and all were answered. The patient agreed with the plan and demonstrated an understanding of the instructions.   The patient was advised to call back or seek an in-person evaluation if the symptoms worsen or if the condition fails to improve as anticipated.   BH MD OP Progress Note  10/06/2022 8:57 AM MARSHAE SHRAWDER  MRN:  [id number]  Chief Complaint:  Chief Complaint  Patient presents with   Follow-up   Depression   Anxiety   Medication Refill   HPI: [name] is a 47 year old Caucasian female, divorced, lives in [place], has a history of MDD, panic disorder, PTSD, primary insomnia, history of seizure disorder, chronic pain, history of gastric bypass(2009), small bowel obstruction, recent diagnosis of SIBO, was evaluated by telemedicine today.  Patient today reports she is overall doing better with regards to her mood symptoms.  She continues to have anxiety regarding situational stressors.  Continues to help her dad who has terminal cancer, visits him 3 times a week .  That can be stressful although she is managing okay.  Patient reports her daughter is currently settling in at school.  She has made some good friends which is a relief.  Patient reports she has joined a new church and currently that has helped her.  It is an outlet for her.  Patient reports she  is currently compliant on the medications like Viibryd, lamotrigine, venlafaxine, Ambien, Wellbutrin, hydroxyzine as needed.  Denies side effects.  She denies any suicidality, homicidality or perceptual disturbances.  Reports sleep as good on the Ambien.  She has not been able to schedule an appointment with the therapist-Ms. Felecia Jan as discussed last visit.  She does not believe she has time for that at this time and since she is getting better she would like to wait.  Patient denies any other concerns today.  Visit Diagnosis:    ICD-10-CM   1. MDD (major depressive disorder), recurrent, in full remission (HCC)  F33.42     2. Panic disorder  F41.0     3. PTSD (post-traumatic stress disorder)  F43.10     4. Primary insomnia  F51.01       Past Psychiatric History: I have reviewed past psychiatric history from progress note on 05/09/2017.  Past Medical History:  Past Medical History:  Diagnosis Date   Abnormal ECG    Anemia    Anxiety    Arthritis    Asthma    B12 deficiency    Cardiomyopathy (HCC)    Chronic abdominal pain    Complication of anesthesia    difficulty to get sedated during endoscopy   COVID-19 01/18/2019   DDD (degenerative disc disease), lumbar    Depression    Diabetes mellitus without complication (HCC)    ONLY gestational diabetes, does not have chronic diabetes   Dysrhythmia    GERD (gastroesophageal reflux disease)    Iron  deficiency    Migraine headache    Neuropathy    Obesity    gastric bypass 2009   Ovarian cyst    Pneumonia    within past five years   PTSD (post-traumatic stress disorder)    PTSD (post-traumatic stress disorder)    Rh negative status during pregnancy    Seizures (HCC)    post-gestational   Small bowel obstruction (HCC)     Past Surgical History:  Procedure Laterality Date   ABDOMINAL HYSTERECTOMY     BILATERAL SALPINGECTOMY Bilateral 09/29/2015   Procedure: BILATERAL SALPINGECTOMY;  Surgeon: [name],  MD;  Location: [facility];  Service: Gynecology;  Laterality: Bilateral;   bowel obstruction  2011   CHOLECYSTECTOMY     COLONOSCOPY WITH PROPOFOL N/A 01/24/2018   Procedure: COLONOSCOPY WITH PROPOFOL;  Surgeon: Toledo, Boykin Nearing, MD;  Location: [facility];  Service: Gastroenterology;  Laterality: N/A;   DILATION AND CURETTAGE OF UTERUS     ESOPHAGOGASTRODUODENOSCOPY     ESOPHAGOGASTRODUODENOSCOPY (EGD) WITH PROPOFOL N/A 01/24/2018   Procedure: ESOPHAGOGASTRODUODENOSCOPY (EGD) WITH PROPOFOL;  Surgeon: Toledo, Boykin Nearing, MD;  Location: [facility];  Service: Gastroenterology;  Laterality: N/A;   GASTRIC BYPASS  2010   gi bleed  2009   Surgery-was in ICU for three weeks   HERNIA REPAIR     OVARIAN CYST REMOVAL Left 09/29/2015   Procedure: OVARIAN CYSTECTOMY;  Surgeon: [name], MD;  Location: [facility];  Service: Gynecology;  Laterality: Left;   ROUX-EN-Y PROCEDURE     VAGINAL HYSTERECTOMY N/A 09/29/2015   Procedure: HYSTERECTOMY VAGINAL;  Surgeon: [name], MD;  Location: [facility];  Service: Gynecology;  Laterality: N/A;    Family Psychiatric History: I have reviewed family psychiatric history from progress note on 05/09/2017.  Family History:  Family History  Problem Relation Age of Onset   Arthritis/Rheumatoid Mother    Heart block Mother    Clotting disorder Mother    Osteoporosis Mother    Heart attack Mother    Anxiety disorder Mother    Depression Mother    Colon polyps Mother    Stomach cancer Father 68   Heart disease Father    Heart attack Father    Depression Sister    Anxiety disorder Sister    Colon polyps Sister    Colon cancer Neg Hx    Esophageal cancer Neg Hx    Pancreatic cancer Neg Hx     Social History: I have reviewed social history from progress note on 05/09/2017. Social History   Socioeconomic History   Marital status: Single    Spouse name: Not on file   Number of children: Not on file   Years of education: Not on file    Highest education level: Not on file  Occupational History   Not on file  Tobacco Use   Smoking status: Never   Smokeless tobacco: Never  Vaping Use   Vaping status: Never Used  Substance and Sexual Activity   Alcohol use: No    Alcohol/week: 0.0 standard drinks of alcohol   Drug use: No   Sexual activity: Yes    Partners: Male    Birth control/protection: Surgical  Other Topics Concern   Not on file  Social History Narrative   Lives with mother, daughter, son, and fiance. Pets, dogs, in home.   Social Determinants of Health   Financial Resource Strain: Not on file  Food Insecurity: Not on file  Transportation Needs: Not on file  Physical Activity: Not  on file  Stress: Not on file  Social Connections: Not on file    Allergies:  Allergies  Allergen Reactions   Amoxicillin Anaphylaxis, Hives, Shortness Of Breath, Swelling and Rash    Has patient had a PCN reaction causing immediate rash, facial/tongue/throat swelling, SOB or lightheadedness with hypotension: Yes Has patient had a PCN reaction causing severe rash involving mucus membranes or skin necrosis: Yes Has patient had a PCN reaction that required hospitalization No Has patient had a PCN reaction occurring within the last 10 years: No If all of the above answers are "NO", then may proceed with Cephalosporin use. Other reaction(s): ANAPHYLAXIS Other reaction(s): ANAPHYLAX   Penicillins Anaphylaxis, Swelling, Rash, Shortness Of Breath and Hives    Has patient had a PCN reaction causing immediate rash, facial/tongue/throat swelling, SOB or lightheadedness with hypotension: Yes Has patient had a PCN reaction causing severe rash involving mucus membranes or skin necrosis: Yes Has patient had a PCN reaction that required hospitalization No Has patient had a PCN reaction occurring within the last 10 years: No If all of the above answers are "NO", then may proceed with Cephalosporin use.  Other reaction(s):  ANAPHYLAXIS Other reaction(s): ANAPHYLAXIS Has patient had a PCN reaction causing immediate rash, facial/tongue/throat swelling, SOB or lightheadedness with hypotension: Yes Has patient had a PCN reaction causing severe rash involving mucus membranes or skin necrosis: Yes Has patient had a PCN reaction that required hospitalization No Has patient had a PCN reaction occurring within the last 10 years: No If all of the above answers are "NO", then may proceed with Cephalosporin use. Other reaction(s): ANAPHYLAXIS Other reaction(s): ANAPHYLAX Has patient had a PCN reaction causing immediate rash, facial/tongue/throat swelling, SOB or lightheadedness with hypotension: Yes Has patient had a PCN reaction causing severe rash involving mucus membranes or skin necrosis: Yes Has patient had a PCN reaction that required hospitalization No Has patient had a PCN reaction occurring within the last 10 years: No If all of the above answers are "NO", then may proceed with Cephalosporin use. Other reaction(s): ANAPHYLAXIS Other reaction(s): ANAPHYLAXIS    Morphine Other (See Comments)    Muscle spasms   Sulfa Antibiotics Nausea Only    Other reaction(s): VOMITING Other reaction(s): VOMITING Other reaction(s): VOMITING    Metabolic Disorder Labs: Lab Results  Component Value Date   HGBA1C 5.5 12/11/2019   MPG 111 12/11/2019   Lab Results  Component Value Date   PROLACTIN 12.1 12/11/2019   Lab Results  Component Value Date   CHOL 162 12/11/2019   TRIG 51 12/11/2019   HDL 87 12/11/2019   CHOLHDL 1.9 12/11/2019   VLDL 10 12/11/2019   LDLCALC 65 12/11/2019   Lab Results  Component Value Date   TSH 1.886 12/11/2019   TSH  05/13/2009    3.163 (NOTE) Please note change in reference ranges for ages 92W to 15Y. Test methodology is 3rd generation TSH    Therapeutic Level Labs: No results found for: "LITHIUM" No results found for: "VALPROATE" No results found for: "CBMZ"  Current  Medications: Current Outpatient Medications  Medication Sig Dispense Refill   acetaminophen (TYLENOL) 325 MG tablet Take 650 mg by mouth every 4 (four) hours as needed for moderate pain.      AIMOVIG 140 MG/ML SOAJ SMARTSIG:140 Milligram(s) SUB-Q Every 4 Weeks     ALPRAZolam (XANAX) 0.25 MG tablet TAKE 1 TABLET BY MOUTH EVERY DAY AS NEEDED FOR UP TO 3 DAYS FOR ANXIETY     buPROPion (WELLBUTRIN XL)  150 MG 24 hr tablet TAKE 1 TABLET(150 MG) BY MOUTH DAILY 90 tablet 1   cyanocobalamin (VITAMIN B12) 1000 MCG/ML injection Inject 1000 mcg into the muscle twice weekly  dispense with syringes and needles 8 mL 3   diphenoxylate-atropine (LOMOTIL) 2.5-0.025 MG tablet Take 1 tablet by mouth as needed. 60 tablet 0   HYDROcodone-acetaminophen (NORCO) 7.5-325 MG tablet Take 1 tablet by mouth every 6 (six) hours as needed for moderate pain.     hydrOXYzine (ATARAX) 50 MG tablet Take 1 tablet (50 mg total) by mouth 2 (two) times daily as needed for anxiety. 60 tablet 2   lamoTRIgine (LAMICTAL) 200 MG tablet TAKE 1/2 TABLET(100 MG) BY MOUTH TWICE DAILY 90 tablet 1   lamoTRIgine (LAMICTAL) 25 MG tablet Take 1 tablet (25 mg total) by mouth daily. Take along with 200 mg daily 30 tablet 2   Multiple Vitamins-Minerals (HM MULTIVITAMIN ADULT GUMMY) CHEW Chew 2 tablets by mouth daily.     NONFORMULARY OR COMPOUNDED ITEM calcidiol formulation with 40 mcg vitamin D3 daily 30 each 3   pantoprazole (PROTONIX) 40 MG tablet Take 1 tablet (40 mg total) by mouth 2 (two) times daily. 60 tablet 3   rifaximin (XIFAXAN) 200 MG tablet Take 200 mg by mouth 3 (three) times daily.     SUMAtriptan (IMITREX) 100 MG tablet Take 100 mg by mouth every 2 (two) hours as needed for migraine or headache.      topiramate (TOPAMAX) 50 MG tablet Take 100 mg by mouth 2 (two) times daily.     Vilazodone HCl (VIIBRYD) 40 MG TABS Take 1 tablet (40 mg total) by mouth daily. 90 tablet 1   Vitamin D, Ergocalciferol, (DRISDOL) 1.25 MG (50000 UNIT) CAPS  capsule Take 1 capsule (50,000 Units total) by mouth daily. 30 capsule 3   zolpidem (AMBIEN CR) 12.5 MG CR tablet Take 1 tablet (12.5 mg total) by mouth at bedtime. 30 tablet 1   colestipol (COLESTID) 1 g tablet Take 2 tablets (2 g total) by mouth 2 (two) times daily. (Patient not taking: Reported on 10/06/2022) 180 tablet 1   No current facility-administered medications for this visit.     Musculoskeletal: Strength & Muscle Tone:  UTA Gait & Station: seated Patient leans: N/A  Psychiatric Specialty Exam: Review of Systems  Psychiatric/Behavioral:  The patient is nervous/anxious.     There were no vitals taken for this visit.There is no height or weight on file to calculate BMI.  General Appearance: Fairly Groomed  Eye Contact:  Fair  Speech:  Clear and Coherent  Volume:  Normal  Mood:  Anxious  Affect:  Congruent  Thought Process:  Goal Directed and Descriptions of Associations: Intact  Orientation:  Full (Time, Place, and Person)  Thought Content: Logical   Suicidal Thoughts:  No  Homicidal Thoughts:  No  Memory:  Immediate;   Fair Recent;   Fair Remote;   Fair  Judgement:  Fair  Insight:  Fair  Psychomotor Activity:  Normal  Concentration:  Concentration: Good and Attention Span: Good  Recall:  Good  Fund of Knowledge: Fair  Language: Good  Akathisia:  No  Handed:  Right  AIMS (if indicated): not done  Assets:  Communication Skills Desire for Improvement Housing Social Support  ADL's:  Intact  Cognition: WNL  Sleep:  Fair, patient reports fatigue and hypersomnia is improving since she is currently on replacement for vitamin B12 and vitamin D.   Screenings: AIMS    Flowsheet Row Video  Visit from 06/16/2021 in [facility]  AIMS Total Score 0      GAD-7    Flowsheet Row Office Visit from 07/05/2022 in [facility] Office Visit from 05/05/2022 in [facility] Office Visit from 03/19/2022 in [facility] Office Visit from 02/25/2022 in [facility] Video Visit from 06/16/2021 in [facility]  Total GAD-7 Score 8 9 3 7 7       PHQ2-9    Flowsheet Row Video Visit from 10/06/2022 in [facility] Office Visit from 07/05/2022 in [facility] Office Visit from 05/05/2022 in [facility] Office Visit from 03/19/2022 in [facility] Office Visit from 02/25/2022 in [facility]  PHQ-2 Total Score 1 4 2 2 2   PHQ-9 Total Score -- 9 8 4 6       Flowsheet Row Video Visit from 10/06/2022 in [facility] Office Visit from 07/05/2022 in [facility] Office Visit from 05/05/2022 in [facility]  C-SSRS RISK CATEGORY No Risk No Risk No Risk        Assessment and Plan: [name] is a 47 year old Caucasian female who has a history of depression, anxiety, insomnia was evaluated by telemedicine today.  Patient is currently improving, noncompliant with psychotherapy sessions as discussed last visit as well as noncompliant with referral for sleep consult, however currently reports she would like to stay on the current medication regimen since she is making progress.  Plan as noted below.  Plan MDD in remission Lamotrigine 225 mg p.o. daily Wellbutrin XL 150 mg p.o. daily-reduced dosage Viibryd 40 mg p.o. daily  PTSD-stable Viibryd 40 mg p.o. daily  Panic disorder-improving Hydroxyzine 50 mg p.o. twice daily as needed for severe anxiety attacks Patient was referred for CBT-Ms. Tina Thompson-patient  noncompliant.  Insomnia-stable Zolpidem extended release 12.5 mg p.o. nightly Reviewed [place] PMP AWARxE  Pending sleep study for hypersomnia.  Patient with vitamin B12 deficiency, vitamin D deficiency, reports she would like to wait until her levels are stable.     Collaboration of Care: Collaboration of Care: Referral or follow-up with counselor/therapist AEB patient encouraged to schedule an appointment with therapist as needed.  Patient/Guardian was advised Release of Information must be obtained prior to any record release in order to collaborate their care with an outside provider. Patient/Guardian was advised if they have not already done so to contact the registration department to sign all necessary forms in order for [place] to release information regarding their care.   Consent: Patient/Guardian gives verbal consent for treatment and assignment of benefits for services provided during this visit. Patient/Guardian expressed understanding and agreed to proceed.   Follow-up in clinic in 3 months or sooner if needed.  This note was generated in part or whole with voice recognition software. Voice recognition is usually quite accurate but there are transcription errors that can and very often do occur. I apologize for any typographical errors that were not detected and corrected.    Jomarie Longs, MD 10/06/2022, 10:20 AM

## 2022-10-08 ENCOUNTER — Other Ambulatory Visit: Payer: Self-pay | Admitting: Psychiatry

## 2022-10-08 DIAGNOSIS — F410 Panic disorder [episodic paroxysmal anxiety] without agoraphobia: Secondary | ICD-10-CM

## 2022-10-31 ENCOUNTER — Other Ambulatory Visit: Payer: Self-pay | Admitting: Psychiatry

## 2022-10-31 DIAGNOSIS — F410 Panic disorder [episodic paroxysmal anxiety] without agoraphobia: Secondary | ICD-10-CM

## 2022-11-17 ENCOUNTER — Ambulatory Visit (INDEPENDENT_AMBULATORY_CARE_PROVIDER_SITE_OTHER): Payer: Medicare Other | Admitting: Internal Medicine

## 2022-11-17 ENCOUNTER — Encounter: Payer: Self-pay | Admitting: Internal Medicine

## 2022-11-17 ENCOUNTER — Other Ambulatory Visit (INDEPENDENT_AMBULATORY_CARE_PROVIDER_SITE_OTHER): Payer: Medicare Other

## 2022-11-17 VITALS — BP 120/70 | HR 75 | Ht 67.0 in | Wt 148.0 lb

## 2022-11-17 DIAGNOSIS — E538 Deficiency of other specified B group vitamins: Secondary | ICD-10-CM

## 2022-11-17 DIAGNOSIS — K580 Irritable bowel syndrome with diarrhea: Secondary | ICD-10-CM | POA: Diagnosis not present

## 2022-11-17 DIAGNOSIS — R7989 Other specified abnormal findings of blood chemistry: Secondary | ICD-10-CM

## 2022-11-17 DIAGNOSIS — E561 Deficiency of vitamin K: Secondary | ICD-10-CM

## 2022-11-17 DIAGNOSIS — Z9884 Bariatric surgery status: Secondary | ICD-10-CM

## 2022-11-17 DIAGNOSIS — E559 Vitamin D deficiency, unspecified: Secondary | ICD-10-CM

## 2022-11-17 LAB — VITAMIN B12: Vitamin B-12: 150 pg/mL — ABNORMAL LOW (ref 211–911)

## 2022-11-17 LAB — VITAMIN D 25 HYDROXY (VIT D DEFICIENCY, FRACTURES): VITD: 12.97 ng/mL — ABNORMAL LOW (ref 30.00–100.00)

## 2022-11-17 MED ORDER — METRONIDAZOLE 250 MG PO TABS: 250.0000 mg | ORAL_TABLET | Freq: Three times a day (TID) | ORAL | 0 refills | Status: AC

## 2022-11-17 NOTE — Patient Instructions (Addendum)
Your provider has requested that you go to the basement level for lab work before leaving today. Press "B" on the elevator. The lab is located at the first door on the left as you exit the elevator.  We have sent the following medications to your pharmacy for you to pick up at your convenience: Flagyl  You are scheduled for a follow up visit on 02/21/22 at 8:30 am  If your blood pressure at your visit was 140/90 or greater, please contact your primary care physician to follow up on this.  If you are age 45 or older, your body mass index should be between 23-30. Your Body mass index is 23.18 kg/m. If this is out of the aforementioned range listed, please consider follow up with your Primary Care Provider.  If you are age 45 or younger, your body mass index should be between 19-25. Your Body mass index is 23.18 kg/m. If this is out of the aformentioned range listed, please consider follow up with your Primary Care Provider.   ________________________________________________________  The [facility] providers would like to encourage you to use [facility] to communicate with providers for non-urgent requests or questions.  Due to long hold times on the telephone, sending your provider a message by [facility] may be a faster and more efficient way to get a response.  Please allow 48 business hours for a response.  Please remember that this is for non-urgent requests.  _______________________________________________________  Due to recent changes in healthcare laws, you may see the results of your imaging and laboratory studies on MyChart before your provider has had a chance to review them.  We understand that in some cases there may be results that are confusing or concerning to you. Not all laboratory results come back in the same time frame and the provider may be waiting for multiple results in order to interpret others.  Please give [place] 48 hours in order for your provider to thoroughly review all the  results before contacting the office for clarification of your results.   Thank you for entrusting me with your care and for choosing [facility], Dr. Eulah Pont

## 2022-11-17 NOTE — Progress Notes (Signed)
11/17/2022 TARESSA RAUH [id number] 1975-11-01  ASSESSMENT AND PLAN:   Diarrhea with weight loss, dyspepsia.  Vit B12 deficiency Vit D deficiency Patient did respond to an empiric course of therapy with rifaximin temporarily for about a month but then had a recurrence in her symptoms.  Will give her another course of antibiotics to see if this helps further with her symptoms.  Patient has had mild absorption issues after her gastric bypass surgery.  Will check her vitamin levels today to see their status. - Cont pantoprazole 40 mg BID - Start Flagyl 250 mg TID for 10 days for IBS-D/SIBO - Continue vitamin D and vitamin B12 supplements - Check vitamin B12, vitamin D, vitamin A, vitamin E, and vitamin K levels - Continue Lomotil PRN - Encourage high protein, high fiber, and low carb diet - Declined a referral back to [facility] bariatrics at this time - Encouraged her to go see the eye doctor for vision issues (can wait for vitamin A level to come back) - RTC 2-3 months  Patient Care Team: Kaleen Mask, MD as PCP - General (Family Medicine)  HISTORY OF PRESENT ILLNESS: 47 y.o. female with a past medical history of cardiomyopathy, type 2 diabetes, PTSD, iron deficiency anemia, post Roux-en-Y 2009, postop complications of bowel obstruction in 2011 from multiple abdominal surgeries, likely adhesions presents for follow-up of diarrhea.  Interval History: For about a month after she took the rifaximin antibiotic therapy for presumed SIBO, she felt better. Then symptoms worsened again. She is still having loose stools and gas.  She does still take Lomotil as needed and still takes hydrocodone as needed.  She has been doing the vitamin B12 shots weekly. She is taking vit D 50,000 daily. She has been told that she has low vitamin K levels in the past.  She was never put on any vitamin K supplements.  Colestipol did not work for her diarrhea.  Endorses occasional lower ab pain. Denies  blood in the stools. Vision has been poor recently. The last time that she has seen the eye doctor was a year ago.  Weight has gone up slightly over time.  Wt Readings from Last 3 Encounters:  11/17/22 148 lb (67.1 kg)  05/03/22 144 lb (65.3 kg)  04/21/22 140 lb (63.5 kg)   Labs/Imaging/Endoscopies: 07/15/2021 Labs reviewed show iron 128, saturation slightly increased at 32, ferritin normal at 24, normal CBC without anemia or leukocytosis.  Negative HIV, A1c 5.6, folate normal at 7.7,  B12 very low at 80, normal thyroid at 1.4 sed rate normal. Patient did have a low ferritin 12/21/2019 iron was low normal at 30 with decreased saturations.  Labs 12/2021: Pancreatic fecal elastase was normal.  O&P was normal.  CBC and CMP were unremarkable.  Vitamin B12 was normal.  Vitamin D levels were low at 21   03/05/2021 CT abdomen pelvis with contrast for left-sided abdominal pain, nausea, emesis and diarrhea history of multiple surgeries evaluate for obstruction showed postsurgical changes of gastric bypass no evidence of bowel obstruction or inflammatory bowel changes no acute abnormality.  Focal fatty disposition of the liver status postcholecystectomy without biliary dilatation.  Normal pancreas  She also had unremarkable colon 2008 at [place] and EGD 2008 and 2015 unremarkable at [facility].  01/24/2018 colonoscopy and upper endoscopy At [facility] regional with Dr. Norma Fredrickson for functional diarrhea, fecal incontinence  Colonoscopy difficult due to redundant colon, good bowel prep normal sphincter tone, normal colonoscopy nonbleeding internal hemorrhoids grade 1 biopsies  unremarkable. Endoscopy shows evidence of Roux-en-Y gastrojejunostomy healthy-appearing anastomosis, normal examined jejunum  EGD 10/13/21: - Normal esophagus. Biopsied. - Roux-en-Y gastrojejunostomy with gastrojejunal anastomosis characterized by healthy appearing mucosa. - A single gastric polyp. Resected and retrieved. - A single gastric  polyp. Resected and retrieved. - Normal examined jejunum. Biopsied. Path: 1. Surgical [P], small bowel - SMALL INTESTINAL MUCOSA WITH NO SPECIFIC HISTOPATHOLOGIC CHANGES - NEGATIVE FOR INCREASED INTRAEPITHELIAL LYMPHOCYTES OR VILLOUS ARCHITECTURAL CHANGES 2. Surgical [P], gastric - GASTRIC ANTRAL MUCOSA WITH MILD NONSPECIFIC REACTIVE GASTROPATHY - HELICOBACTER PYLORI-LIKE ORGANISMS ARE NOT IDENTIFIED ON ROUTINE H&E STAIN 3. Surgical [P], nodular gastric mucosa - GASTRIC ANTRAL AND OXYNTIC MUCOSA WITH NONSPECIFIC HYPERPLASTIC CHANGES - NEGATIVE FOR INTESTINAL METAPLASIA OR DYSPLASIA 4. Surgical [P], esophageal - ESOPHAGEAL SQUAMOUS MUCOSA WITH VASCULAR CONGESTION, SQUAMOUS BALLOONING AND FEW INTRAEPITHELIAL EOSINOPHILS (UP TO 3/HIGH POWER FIELD), CONSISTENT WITH REFLUX ESOPHAGITIS   Current Medications:    Current Outpatient Medications (Cardiovascular):    colestipol (COLESTID) 1 g tablet, Take 2 tablets (2 g total) by mouth 2 (two) times daily. (Patient not taking: Reported on 10/06/2022)   Current Outpatient Medications (Analgesics):    acetaminophen (TYLENOL) 325 MG tablet, Take 650 mg by mouth every 4 (four) hours as needed for moderate pain.    AIMOVIG 140 MG/ML SOAJ, SMARTSIG:140 Milligram(s) SUB-Q Every 4 Weeks   HYDROcodone-acetaminophen (NORCO) 7.5-325 MG tablet, Take 1 tablet by mouth every 6 (six) hours as needed for moderate pain.   SUMAtriptan (IMITREX) 100 MG tablet, Take 100 mg by mouth every 2 (two) hours as needed for migraine or headache.   Current Outpatient Medications (Hematological):    cyanocobalamin (VITAMIN B12) 1000 MCG/ML injection, Inject 1000 mcg into the muscle twice weekly  dispense with syringes and needles  Current Outpatient Medications (Other):    ALPRAZolam (XANAX) 0.25 MG tablet, TAKE 1 TABLET BY MOUTH EVERY DAY AS NEEDED FOR UP TO 3 DAYS FOR ANXIETY   buPROPion (WELLBUTRIN XL) 150 MG 24 hr tablet, TAKE 1 TABLET(150 MG) BY MOUTH DAILY    diphenoxylate-atropine (LOMOTIL) 2.5-0.025 MG tablet, Take 1 tablet by mouth as needed.   hydrOXYzine (ATARAX) 50 MG tablet, TAKE 1 TABLET(50 MG) BY MOUTH TWICE DAILY AS NEEDED FOR ANXIETY   lamoTRIgine (LAMICTAL) 200 MG tablet, TAKE 1/2 TABLET(100 MG) BY MOUTH TWICE DAILY   lamoTRIgine (LAMICTAL) 25 MG tablet, Take 1 tablet (25 mg total) by mouth daily. Take along with 200 mg daily   Multiple Vitamins-Minerals (HM MULTIVITAMIN ADULT GUMMY) CHEW, Chew 2 tablets by mouth daily.   pantoprazole (PROTONIX) 40 MG tablet, Take 1 tablet (40 mg total) by mouth 2 (two) times daily.   topiramate (TOPAMAX) 50 MG tablet, Take 100 mg by mouth 2 (two) times daily.   Vilazodone HCl (VIIBRYD) 40 MG TABS, Take 1 tablet (40 mg total) by mouth daily.   Vitamin D, Ergocalciferol, (DRISDOL) 1.25 MG (50000 UNIT) CAPS capsule, Take 1 capsule (50,000 Units total) by mouth daily. (Patient taking differently: Take 50,000 Units by mouth every other day.)   zolpidem (AMBIEN CR) 12.5 MG CR tablet, Take 1 tablet (12.5 mg total) by mouth at bedtime.   NONFORMULARY OR COMPOUNDED ITEM, calcidiol formulation with 40 mcg vitamin D3 daily (Patient not taking: Reported on 11/17/2022)  PHYSICAL EXAM: BP 120/70   Pulse 75   Ht 5\' 7"  (1.702 m)   Wt 148 lb (67.1 kg)   LMP  (LMP Unknown)   BMI 23.18 kg/m  General:   Pleasant, well developed female in no acute  distress Head:   Normocephalic and atraumatic. Eyes:  sclerae anicteric,conjunctive pink  Heart:   regular rate and rhythm Pulm:  Clear anteriorly; no wheezing Abdomen:   Soft, Non-distended AB, Active bowel sounds. Non-tender. Extremities:  Without edema. Msk: Symmetrical without gross deformities. Peripheral pulses intact.  Neurologic:  Alert and  oriented x4;  No focal deficits.  Skin:   Dry and intact without significant lesions or rashes. Psychiatric:  Cooperative. Normal mood and affect.  Imogene Burn, MD 8:46 AM  I spent 40 minutes of time, including in depth  chart review, independent review of results as outlined above, communicating results with the patient directly, face-to-face time with the patient, coordinating care, ordering studies and medications as appropriate, and documentation.

## 2022-11-20 LAB — VITAMIN E
Gamma-Tocopherol (Vit E): 1.7 mg/L (ref <4.4)
Vitamin E (Alpha Tocopherol): 8.1 mg/L (ref 5.7–19.9)

## 2022-11-20 LAB — VITAMIN A: Vitamin A (Retinoic Acid): 40 ug/dL (ref 38–98)

## 2022-11-20 LAB — VITAMIN K1, SERUM: Vitamin K: 189 pg/mL (ref 130–1500)

## 2022-11-23 ENCOUNTER — Other Ambulatory Visit: Payer: Self-pay | Admitting: Internal Medicine

## 2022-11-23 DIAGNOSIS — R7989 Other specified abnormal findings of blood chemistry: Secondary | ICD-10-CM

## 2022-11-23 DIAGNOSIS — F5101 Primary insomnia: Secondary | ICD-10-CM

## 2022-11-23 DIAGNOSIS — E538 Deficiency of other specified B group vitamins: Secondary | ICD-10-CM

## 2022-11-23 MED ORDER — CYANOCOBALAMIN 1000 MCG/ML IJ SOLN
INTRAMUSCULAR | 3 refills | Status: DC
Start: 2022-11-23 — End: 2022-12-01

## 2022-11-23 MED ORDER — CALCIFEDIOL ER 30 MCG PO CPCR: 30.0000 ug | ORAL_CAPSULE | Freq: Every day | ORAL | 3 refills | Status: DC

## 2022-11-24 ENCOUNTER — Encounter: Payer: Self-pay | Admitting: Internal Medicine

## 2022-11-24 MED ORDER — ZOLPIDEM TARTRATE ER 12.5 MG PO TBCR: 12.5000 mg | EXTENDED_RELEASE_TABLET | Freq: Every day | ORAL | 5 refills | Status: DC

## 2022-11-30 ENCOUNTER — Other Ambulatory Visit: Payer: Self-pay

## 2022-11-30 DIAGNOSIS — R7989 Other specified abnormal findings of blood chemistry: Secondary | ICD-10-CM

## 2022-11-30 MED ORDER — CALCIFEDIOL ER 30 MCG PO CPCR: 30.0000 ug | ORAL_CAPSULE | Freq: Every day | ORAL | 3 refills | Status: DC

## 2022-12-01 ENCOUNTER — Other Ambulatory Visit: Payer: Self-pay | Admitting: Internal Medicine

## 2022-12-01 DIAGNOSIS — E538 Deficiency of other specified B group vitamins: Secondary | ICD-10-CM

## 2022-12-13 ENCOUNTER — Telehealth: Payer: Self-pay | Admitting: Pharmacy Technician

## 2022-12-13 ENCOUNTER — Other Ambulatory Visit (HOSPITAL_COMMUNITY): Payer: Self-pay

## 2022-12-13 NOTE — Telephone Encounter (Signed)
Pharmacy Patient Advocate Encounter   Received notification from Patient Pharmacy that prior authorization for VIT D 50,000 is required/requested.   Insurance verification completed.   The patient is insured through [employer] .   Per test claim: PA required; PA submitted to [facility] via CoverMyMeds Key/confirmation #/EOC [id number] Status is pending

## 2022-12-13 NOTE — Telephone Encounter (Signed)
Pharmacy Patient Advocate Encounter  Received notification from [facility] that Prior Authorization for VIT D 50000 has been DENIED.  Full denial letter will be uploaded to the media tab. See denial reason below.   PA #/Case ID/Reference #: [id number]

## 2022-12-14 NOTE — Telephone Encounter (Signed)
I tried to call the patient. No answer. I have sent a communication to her through My Chart.

## 2022-12-28 ENCOUNTER — Encounter: Payer: Self-pay | Admitting: Psychiatry

## 2022-12-28 ENCOUNTER — Telehealth (INDEPENDENT_AMBULATORY_CARE_PROVIDER_SITE_OTHER): Payer: Medicare Other | Admitting: Psychiatry

## 2022-12-28 DIAGNOSIS — F331 Major depressive disorder, recurrent, moderate: Secondary | ICD-10-CM

## 2022-12-28 DIAGNOSIS — F410 Panic disorder [episodic paroxysmal anxiety] without agoraphobia: Secondary | ICD-10-CM

## 2022-12-28 DIAGNOSIS — F4310 Post-traumatic stress disorder, unspecified: Secondary | ICD-10-CM

## 2022-12-28 DIAGNOSIS — F5101 Primary insomnia: Secondary | ICD-10-CM

## 2022-12-28 MED ORDER — LAMOTRIGINE 25 MG PO TABS: 25.0000 mg | ORAL_TABLET | Freq: Every day | ORAL | 2 refills | Status: DC

## 2022-12-28 MED ORDER — HYDROXYZINE HCL 50 MG PO TABS: 50.0000 mg | ORAL_TABLET | Freq: Two times a day (BID) | ORAL | 2 refills | Status: DC | PRN

## 2022-12-28 MED ORDER — LAMOTRIGINE 200 MG PO TABS: ORAL_TABLET | ORAL | 1 refills | Status: DC

## 2022-12-28 MED ORDER — BUPROPION HCL ER (XL) 150 MG PO TB24: 150.0000 mg | ORAL_TABLET | Freq: Every day | ORAL | 1 refills | Status: DC

## 2022-12-28 NOTE — Progress Notes (Signed)
Virtual Visit via Video Note  I connected with [name] on 12/28/22 at  8:30 AM EST by a video enabled telemedicine application and verified that I am speaking with the correct person using two identifiers.  Location Provider Location : [facility] Patient Location : Car  Participants: Patient , Provider    I discussed the limitations of evaluation and management by telemedicine and the availability of in person appointments. The patient expressed understanding and agreed to proceed.    I discussed the assessment and treatment plan with the patient. The patient was provided an opportunity to ask questions and all were answered. The patient agreed with the plan and demonstrated an understanding of the instructions.   The patient was advised to call back or seek an in-person evaluation if the symptoms worsen or if the condition fails to improve as anticipated.    BH MD OP Progress Note  12/28/2022 1:07 PM WAFA EGERT  MRN:  [id number]  Chief Complaint:  Chief Complaint  Patient presents with   Follow-up   Depression   Anxiety   Medication Refill   HPI: [name] is a 47 year old Caucasian female, divorced, lives in [place], has a history of MDD, panic disorder, PTSD, primary insomnia, history of seizure disorder, chronic pain, history of gastric bypass ( 2009), small bowel obstruction was evaluated by telemedicine today.   Patient today reports overall she is doing okay on the current medication regimen.  She continues to worry about her dad.  Her dad is currently living with her since he currently struggles with terminal cancer.  Patient reports he needs a lot of support and that is why she decided to move him in with her.  She reports she is also trying to support her daughter who is trying to understand what is going on with her grandfather.  She is taking it 1 day at a time.  Patient reports overall sleep as better.  She does not have a lot of  hypersomnia and fatigue like she had before.  She is currently on vitamin D replacement.  She had medication readjustment.  Patient reports sleep at night is good.  She denies any suicidality, homicidality or perceptual disturbances.  Patient denies any other concerns today. Visit Diagnosis:    ICD-10-CM   1. MDD (major depressive disorder), recurrent episode, moderate (HCC)  F33.1 lamoTRIgine (LAMICTAL) 25 MG tablet    lamoTRIgine (LAMICTAL) 200 MG tablet    2. Panic disorder  F41.0 hydrOXYzine (ATARAX) 50 MG tablet    3. PTSD (post-traumatic stress disorder)  F43.10 buPROPion (WELLBUTRIN XL) 150 MG 24 hr tablet    4. Primary insomnia  F51.01       Past Psychiatric History: I have reviewed past psychiatric history from progress note on 05/09/2017.  Past Medical History:  Past Medical History:  Diagnosis Date   Abnormal ECG    Anemia    Anxiety    Arthritis    Asthma    B12 deficiency    Cardiomyopathy (HCC)    Chronic abdominal pain    Complication of anesthesia    difficulty to get sedated during endoscopy   COVID-19 01/18/2019   DDD (degenerative disc disease), lumbar    Depression    Diabetes mellitus without complication (HCC)    ONLY gestational diabetes, does not have chronic diabetes   Dysrhythmia    GERD (gastroesophageal reflux disease)    Iron deficiency    Migraine headache    Neuropathy  Obesity    gastric bypass 2009   Ovarian cyst    Pneumonia    within past five years   PTSD (post-traumatic stress disorder)    PTSD (post-traumatic stress disorder)    Rh negative status during pregnancy    Seizures (HCC)    post-gestational   Small bowel obstruction (HCC)    Vitamin D deficiency     Past Surgical History:  Procedure Laterality Date   ABDOMINAL HYSTERECTOMY     BILATERAL SALPINGECTOMY Bilateral 09/29/2015   Procedure: BILATERAL SALPINGECTOMY;  Surgeon: [name], MD;  Location: [facility];  Service: Gynecology;  Laterality: Bilateral;    bowel obstruction  2011   CHOLECYSTECTOMY     COLONOSCOPY WITH PROPOFOL N/A 01/24/2018   Procedure: COLONOSCOPY WITH PROPOFOL;  Surgeon: Toledo, Boykin Nearing, MD;  Location: [facility];  Service: Gastroenterology;  Laterality: N/A;   DILATION AND CURETTAGE OF UTERUS     ESOPHAGOGASTRODUODENOSCOPY     ESOPHAGOGASTRODUODENOSCOPY (EGD) WITH PROPOFOL N/A 01/24/2018   Procedure: ESOPHAGOGASTRODUODENOSCOPY (EGD) WITH PROPOFOL;  Surgeon: Toledo, Boykin Nearing, MD;  Location: [facility];  Service: Gastroenterology;  Laterality: N/A;   GASTRIC BYPASS  2010   gi bleed  2009   Surgery-was in ICU for three weeks   HERNIA REPAIR     OVARIAN CYST REMOVAL Left 09/29/2015   Procedure: OVARIAN CYSTECTOMY;  Surgeon: [name], MD;  Location: [facility];  Service: Gynecology;  Laterality: Left;   ROUX-EN-Y PROCEDURE     VAGINAL HYSTERECTOMY N/A 09/29/2015   Procedure: HYSTERECTOMY VAGINAL;  Surgeon: [name], MD;  Location: [facility];  Service: Gynecology;  Laterality: N/A;    Family Psychiatric History: I have reviewed family psychiatric history from progress note on 05/09/2017.  Family History:  Family History  Problem Relation Age of Onset   Arthritis/Rheumatoid Mother    Heart block Mother    Clotting disorder Mother    Osteoporosis Mother    Heart attack Mother    Anxiety disorder Mother    Depression Mother    Colon polyps Mother    Stomach cancer Father 21   Heart disease Father    Heart attack Father    Depression Sister    Anxiety disorder Sister    Colon polyps Sister    Colon cancer Neg Hx    Esophageal cancer Neg Hx    Pancreatic cancer Neg Hx     Social History: I have reviewed social history from progress note on 05/09/2017. Social History   Socioeconomic History   Marital status: Single    Spouse name: Not on file   Number of children: 2   Years of education: Not on file   Highest education level: Not on file  Occupational History   Occupation: disabled   Tobacco Use   Smoking status: Never   Smokeless tobacco: Never  Vaping Use   Vaping status: Never Used  Substance and Sexual Activity   Alcohol use: No    Alcohol/week: 0.0 standard drinks of alcohol   Drug use: No   Sexual activity: Yes    Partners: Male    Birth control/protection: Surgical  Other Topics Concern   Not on file  Social History Narrative   Lives with mother, daughter, son, and fiance. Pets, dogs, in home.   Social Determinants of Health   Financial Resource Strain: Not on file  Food Insecurity: Not on file  Transportation Needs: Not on file  Physical Activity: Not on file  Stress: Not on file  Social Connections:  Not on file    Allergies:  Allergies  Allergen Reactions   Amoxicillin Anaphylaxis, Hives, Shortness Of Breath, Swelling and Rash    Has patient had a PCN reaction causing immediate rash, facial/tongue/throat swelling, SOB or lightheadedness with hypotension: Yes Has patient had a PCN reaction causing severe rash involving mucus membranes or skin necrosis: Yes Has patient had a PCN reaction that required hospitalization No Has patient had a PCN reaction occurring within the last 10 years: No If all of the above answers are "NO", then may proceed with Cephalosporin use. Other reaction(s): ANAPHYLAXIS Other reaction(s): ANAPHYLAX   Penicillins Anaphylaxis, Swelling, Rash, Shortness Of Breath and Hives    Has patient had a PCN reaction causing immediate rash, facial/tongue/throat swelling, SOB or lightheadedness with hypotension: Yes Has patient had a PCN reaction causing severe rash involving mucus membranes or skin necrosis: Yes Has patient had a PCN reaction that required hospitalization No Has patient had a PCN reaction occurring within the last 10 years: No If all of the above answers are "NO", then may proceed with Cephalosporin use.  Other reaction(s): ANAPHYLAXIS Other reaction(s): ANAPHYLAXIS Has patient had a PCN reaction causing  immediate rash, facial/tongue/throat swelling, SOB or lightheadedness with hypotension: Yes Has patient had a PCN reaction causing severe rash involving mucus membranes or skin necrosis: Yes Has patient had a PCN reaction that required hospitalization No Has patient had a PCN reaction occurring within the last 10 years: No If all of the above answers are "NO", then may proceed with Cephalosporin use. Other reaction(s): ANAPHYLAXIS Other reaction(s): ANAPHYLAX Has patient had a PCN reaction causing immediate rash, facial/tongue/throat swelling, SOB or lightheadedness with hypotension: Yes Has patient had a PCN reaction causing severe rash involving mucus membranes or skin necrosis: Yes Has patient had a PCN reaction that required hospitalization No Has patient had a PCN reaction occurring within the last 10 years: No If all of the above answers are "NO", then may proceed with Cephalosporin use. Other reaction(s): ANAPHYLAXIS Other reaction(s): ANAPHYLAXIS    Morphine Other (See Comments)    Muscle spasms   Sulfa Antibiotics Nausea Only    Other reaction(s): VOMITING Other reaction(s): VOMITING Other reaction(s): VOMITING    Metabolic Disorder Labs: Lab Results  Component Value Date   HGBA1C 5.5 12/11/2019   MPG 111 12/11/2019   Lab Results  Component Value Date   PROLACTIN 12.1 12/11/2019   Lab Results  Component Value Date   CHOL 162 12/11/2019   TRIG 51 12/11/2019   HDL 87 12/11/2019   CHOLHDL 1.9 12/11/2019   VLDL 10 12/11/2019   LDLCALC 65 12/11/2019   Lab Results  Component Value Date   TSH 1.886 12/11/2019   TSH  05/13/2009    3.163 (NOTE)  Please note change in reference ranges for ages 77W to 22Y. Test methodology is 3rd generation TSH    Therapeutic Level Labs: No results found for: "LITHIUM" No results found for: "VALPROATE" No results found for: "CBMZ"  Current Medications: Current Outpatient Medications  Medication Sig Dispense Refill    acetaminophen (TYLENOL) 325 MG tablet Take 650 mg by mouth every 4 (four) hours as needed for moderate pain.      AIMOVIG 140 MG/ML SOAJ SMARTSIG:140 Milligram(s) SUB-Q Every 4 Weeks     ALPRAZolam (XANAX) 0.25 MG tablet TAKE 1 TABLET BY MOUTH EVERY DAY AS NEEDED FOR UP TO 3 DAYS FOR ANXIETY     azithromycin (ZITHROMAX) 250 MG tablet Take by mouth.  Calcifediol ER 30 MCG CPCR Take 1 capsule (30 mcg total) by mouth daily. 30 capsule 3   cyanocobalamin (VITAMIN B12) 1000 MCG/ML injection Please administer 1000 mcg injection every 3 days 10 mL 4   diclofenac (VOLTAREN) 75 MG EC tablet Take by mouth.     diphenoxylate-atropine (LOMOTIL) 2.5-0.025 MG tablet Take 1 tablet by mouth as needed. 60 tablet 0   Galcanezumab-gnlm 120 MG/ML SOAJ Inject into the skin.     HYDROcodone-acetaminophen (NORCO) 7.5-325 MG tablet Take 1 tablet by mouth every 6 (six) hours as needed for moderate pain.     moxifloxacin (AVELOX) 400 MG tablet Take 400 mg by mouth daily.     Multiple Vitamins-Minerals (HM MULTIVITAMIN ADULT GUMMY) CHEW Chew 2 tablets by mouth daily.     naloxone (NARCAN) nasal spray 4 mg/0.1 mL Place into the nose.     NONFORMULARY OR COMPOUNDED ITEM calcidiol formulation with 40 mcg vitamin D3 daily 30 each 3   pantoprazole (PROTONIX) 40 MG tablet Take 1 tablet (40 mg total) by mouth 2 (two) times daily. 60 tablet 3   predniSONE (DELTASONE) 20 MG tablet Take 20 mg by mouth 2 (two) times daily.     promethazine-dextromethorphan (PROMETHAZINE-DM) 6.25-15 MG/5ML syrup Take 5 mLs by mouth every 6 (six) hours as needed.     SUMAtriptan (IMITREX) 100 MG tablet Take 100 mg by mouth every 2 (two) hours as needed for migraine or headache.      topiramate (TOPAMAX) 50 MG tablet Take 100 mg by mouth 2 (two) times daily.     Vilazodone HCl (VIIBRYD) 40 MG TABS Take 1 tablet (40 mg total) by mouth daily. 90 tablet 1   zolpidem (AMBIEN CR) 12.5 MG CR tablet Take 1 tablet (12.5 mg total) by mouth at bedtime. 30  tablet 5   buPROPion (WELLBUTRIN XL) 150 MG 24 hr tablet Take 1 tablet (150 mg total) by mouth daily. 90 tablet 1   colestipol (COLESTID) 1 g tablet Take 2 tablets (2 g total) by mouth 2 (two) times daily. (Patient not taking: Reported on 12/28/2022) 180 tablet 1   hydrOXYzine (ATARAX) 50 MG tablet Take 1 tablet (50 mg total) by mouth 2 (two) times daily as needed. 60 tablet 2   lamoTRIgine (LAMICTAL) 200 MG tablet TAKE 1/2 TABLET(100 MG) BY MOUTH TWICE DAILY 90 tablet 1   lamoTRIgine (LAMICTAL) 25 MG tablet Take 1 tablet (25 mg total) by mouth daily. Take along with 200 mg daily 30 tablet 2   No current facility-administered medications for this visit.     Musculoskeletal: Strength & Muscle Tone:  UTA Gait & Station:  Seated Patient leans: N/A  Psychiatric Specialty Exam: Review of Systems  Psychiatric/Behavioral:  The patient is nervous/anxious.     There were no vitals taken for this visit.There is no height or weight on file to calculate BMI.  General Appearance: Casual  Eye Contact:  Good  Speech:  Clear and Coherent  Volume:  Normal  Mood:  Anxious  Affect:  Appropriate  Thought Process:  Goal Directed and Descriptions of Associations: Intact  Orientation:  Full (Time, Place, and Person)  Thought Content: Logical   Suicidal Thoughts:  No  Homicidal Thoughts:  No  Memory:  Immediate;   Fair Recent;   Fair Remote;   Fair  Judgement:  Fair  Insight:  Fair  Psychomotor Activity:  Normal  Concentration:  Concentration: Fair and Attention Span: Fair  Recall:  [employer] of Knowledge: Fair  Language: Fair  Akathisia:  No  Handed:  Right  AIMS (if indicated): not done  Assets:  Communication Skills Desire for Improvement Housing Social Support Transportation  ADL's:  Intact  Cognition: WNL  Sleep:  Fair   Screenings: AIMS    Flowsheet Row Video Visit from 06/16/2021 in [facility]  AIMS Total Score 0      GAD-7     Flowsheet Row Office Visit from 07/05/2022 in [facility] Office Visit from 05/05/2022 in [facility] Office Visit from 03/19/2022 in [facility] Office Visit from 02/25/2022 in [facility] Video Visit from 06/16/2021 in [facility]  Total GAD-7 Score 8 9 3 7 7       PHQ2-9    Flowsheet Row Video Visit from 10/06/2022 in [facility] Office Visit from 07/05/2022 in [facility] Office Visit from 05/05/20[facility] in [facility] Office Visit from 03/19/2022 in [facility] Office Visit from 02/25/2022 in [facility]  PHQ-2 Total Score 1 4 2 2 2   PHQ-9 Total Score -- 9 8 4 6       Flowsheet Row Video Visit from 12/28/2022 in [facility] Video Visit from 10/06/2022 in [facility] Office Visit from 07/05/2022 in [facility]  C-SSRS RISK CATEGORY No Risk No Risk No Risk        Assessment and Plan: [name] is a 47 year old Caucasian female who has a history of depression, anxiety, insomnia was evaluated by telemedicine today.  Patient is currently stable.  Plan as noted below.  Plan MDD in remission Lamotrigine 225 mg p.o. daily Wellbutrin XL 150 mg p.o. daily-reduced dosage. Viibryd 40 mg p.o. daily  PTSD-stable Viibryd 40 mg p.o. daily  Panic disorder-improving Hydroxyzine 50 mg p.o. twice daily as needed for severe anxiety attacks Patient was referred for CBT in the past-patient declined.  Insomnia-stable Zolpidem extended release 12.5 mg p.o. nightly Reviewed [place] PMP  AWARxE      Collaboration of Care: Collaboration of Care: Patient refused AEB discussed CBT with patient however patient declines the need for it at this time.  Patient/Guardian was advised Release of Information must be obtained prior to any record release in order to collaborate their care with an outside provider. Patient/Guardian was advised if they have not already done so to contact the registration department to sign all necessary forms in order for [place] to release information regarding their care.   Consent: Patient/Guardian gives verbal consent for treatment and assignment of benefits for services provided during this visit. Patient/Guardian expressed understanding and agreed to proceed.   Follow-up in clinic in 2 to 3 months or sooner if needed.  This note was generated in part or whole with voice recognition software. Voice recognition is usually quite accurate but there are transcription errors that can and very often do occur. I apologize for any typographical errors that were not detected and corrected.    Jomarie Longs, MD 12/28/2022, 1:07 PM

## 2023-01-10 ENCOUNTER — Other Ambulatory Visit (HOSPITAL_BASED_OUTPATIENT_CLINIC_OR_DEPARTMENT_OTHER): Payer: Self-pay

## 2023-01-10 ENCOUNTER — Encounter (HOSPITAL_BASED_OUTPATIENT_CLINIC_OR_DEPARTMENT_OTHER): Payer: Self-pay

## 2023-01-10 ENCOUNTER — Ambulatory Visit (HOSPITAL_BASED_OUTPATIENT_CLINIC_OR_DEPARTMENT_OTHER)
Admission: EM | Admit: 2023-01-10 | Discharge: 2023-01-10 | Disposition: A | Discharge status: Home / Self Care | Payer: Medicare Other | Attending: Internal Medicine | Admitting: Internal Medicine

## 2023-01-10 DIAGNOSIS — R300 Dysuria: Secondary | ICD-10-CM | POA: Insufficient documentation

## 2023-01-10 LAB — POCT URINALYSIS DIP (MANUAL ENTRY)
Bilirubin, UA: NEGATIVE
Blood, UA: NEGATIVE
Glucose, UA: NEGATIVE mg/dL
Ketones, POC UA: NEGATIVE mg/dL
Leukocytes, UA: NEGATIVE
Nitrite, UA: NEGATIVE
Protein Ur, POC: NEGATIVE mg/dL
Spec Grav, UA: 1.01 (ref 1.010–1.025)
Urobilinogen, UA: 0.2 U/dL
pH, UA: 5.5 (ref 5.0–8.0)

## 2023-01-10 MED ORDER — PHENAZOPYRIDINE HCL 200 MG PO TABS
200.0000 mg | ORAL_TABLET | Freq: Three times a day (TID) | ORAL | 0 refills | Status: DC
  Filled 2023-01-10: qty 6, 2d supply, fill #0

## 2023-01-10 NOTE — ED Triage Notes (Signed)
Pt c/o burning on urination, constant urgency, and bladder pressure x1wk. States took 2 Cipro's she had and tylenol with no relief.

## 2023-01-10 NOTE — Discharge Instructions (Signed)
Your urine did not show any indicators of urinary tract infection today.  Vaginal swab results will take several days we will contact you if anything is positive and requires treatment. Follow-up with your doctor or gynecologist if your symptoms persist

## 2023-01-10 NOTE — ED Provider Notes (Signed)
[facility]    CSN: [id number] Arrival date & time: 01/10/23  1102      History   Chief Complaint Chief Complaint  Patient presents with   Urinary Tract Infection    HPI [name] is a 47 y.o. female.    Urinary Tract Infection Associated symptoms: no fever, no flank pain and no vaginal discharge   Possible urinary tract infection symptom onset 1 week ago symptoms include pressure and burning with urination and frequency.  At times has decreased urine output at other times normal urine output. Took 2 leftover Cipro pills. Denies fever, chills, sweats, nausea, vomiting, flank pain, vaginal discharge, vaginal itching or burning, concern for STI, genital rash, change in hygiene products.  States had COVID 1 month ago then developed a sinus infection was treated with 2 rounds of antibiotics which led to a yeast infection.  Yeast infection was treated with a pill she took and repeated 3 days later. Had UTIs in the past last one several years ago. Has had a hysterectomy. Past Medical History:  Diagnosis Date   Abnormal ECG    Anemia    Anxiety    Arthritis    Asthma    B12 deficiency    Cardiomyopathy (HCC)    Chronic abdominal pain    Complication of anesthesia    difficulty to get sedated during endoscopy   COVID-19 01/18/2019   DDD (degenerative disc disease), lumbar    Depression    Diabetes mellitus without complication (HCC)    ONLY gestational diabetes, does not have chronic diabetes   Dysrhythmia    GERD (gastroesophageal reflux disease)    Iron deficiency    Migraine headache    Neuropathy    Obesity    gastric bypass 2009   Ovarian cyst    Pneumonia    within past five years   PTSD (post-traumatic stress disorder)    PTSD (post-traumatic stress disorder)    Rh negative status during pregnancy    Seizures (HCC)    post-gestational   Small bowel obstruction (HCC)    Vitamin D deficiency     Patient Active Problem List   Diagnosis  Date Noted   Hypersomnia 05/05/2022   Osteoarthritis of AC (acromioclavicular) joint (Left) 04/20/2022   Arthralgia of shoulder region (Left) 04/20/2022   Enthesopathy of shoulder (Left) 04/20/2022   Acute pain of left shoulder 04/07/2022   Arthralgia of left acromioclavicular joint 04/06/2022   DDD (degenerative disc disease), cervical 04/06/2022   Redacted shoulder pain (Bilateral) (L>R) 04/06/2022   Chronic shoulder pain (Right) 04/06/2022   Noncompliance with treatment plan 02/25/2022   Abnormal MRI, cervical spine (09/29/2021) ([facility]) 01/11/2022   Cervicogenic headache (Bilateral) 01/11/2022   Occipital neuralgia (Bilateral) 01/11/2022   Vitamin B12 deficiency 12/16/2021   History of idiopathic intracranial hypertension 11/30/2021   Abnormal MRI, lumbar spine (01/28/2017 & 12/11/2021) 11/30/2021   History of bariatric surgery 11/30/2021   Idiopathic small fiber peripheral neuropathy (1ry area of Pain) (feet> hands) 11/30/2021   Chronic feet pain (Bilateral) 11/30/2021   Chronic hand pain (Bilateral) 11/30/2021   Chronic lower extremity pain (3ry area of Pain) (Right) (intermittent) 11/30/2021   Chronic neck pain (4th area of Pain) (Midline) (Left) 11/30/2021   Cervicalgia 11/30/2021   Chronic shoulder pain (5th area of Pain) (Left) 11/30/2021   Complications of bariatric procedures 11/30/2021   Postoperative intestinal malabsorption 11/30/2021   Chronic pain syndrome 11/28/2021   Pharmacologic therapy 11/28/2021   Disorder of skeletal system  11/28/2021   Problems influencing health status 11/28/2021   Adjustment disorder 10/07/2021   Cervical radiculitis 08/05/2021   MDD (major depressive disorder), recurrent, in partial remission (HCC) 06/09/2020   Primary insomnia 04/03/2020   MDD (major depressive disorder), recurrent, in full remission (HCC) 08/13/2019   Insomnia due to medical condition 05/23/2019   MDD (major depressive disorder), recurrent, severe, with psychosis  (HCC) 03/12/2019   MDD (major depressive disorder), recurrent episode, moderate (HCC) 09/15/2018   Panic disorder 09/15/2018   PTSD (post-traumatic stress disorder) 09/15/2018   Insomnia due to mental condition 09/15/2018   Panniculitis 08/01/2018   Neuropathy 07/26/2018   Iron deficiency anemia 11/12/2015   Uterine fibroid 09/30/2015   Abnormal uterine bleeding 09/29/2015   Abdominal pain, generalized 03/27/2015   Abnormal ECG 10/09/2014   Achilles tendinitis 10/09/2014   Anxiety 10/09/2014   DDD (degenerative disc disease), lumbar 10/09/2014   Clinical depression 10/09/2014   Fibroid 06/06/2014   Bariatric surgery status 06/06/2014   Vitamin D deficiency 04/22/2014   Achilles bursitis 04/12/2014   DDD (degenerative disc disease), lumbosacral 04/12/2014   Chronic low back pain (2ry area of Pain) (Right) w/ sciatica (Right) 04/12/2014   Abdominal pain, right upper quadrant 04/12/2014   Diabetes mellitus arising in pregnancy 04/01/2014   Personal history of surgery to heart and great vessels, presenting hazards to health 04/01/2014   Other specified postprocedural states 04/01/2014   Episode of syncope 02/04/2014   Breathlessness on exertion 02/04/2014   Awareness of heartbeats 12/11/2013   Cardiomyopathy (HCC) 10/29/2013   Dizziness 10/29/2013   Mixed incontinence 08/31/2012   Adiposity 08/31/2012   Chronic pain associated with significant psychosocial dysfunction 05/12/2012   Disorder of sacrum 05/12/2012   Lumbar facet arthropathy 05/12/2012    Class: Chronic   Lumbar and sacral osteoarthritis 05/12/2012   Arthralgia, sacroiliac 05/12/2012    Past Surgical History:  Procedure Laterality Date   ABDOMINAL HYSTERECTOMY     BILATERAL SALPINGECTOMY Bilateral 09/29/2015   Procedure: BILATERAL SALPINGECTOMY;  Surgeon: [name], MD;  Location: [facility];  Service: Gynecology;  Laterality: Bilateral;   bowel obstruction  2011   CHOLECYSTECTOMY     COLONOSCOPY WITH  PROPOFOL N/A 01/24/2018   Procedure: COLONOSCOPY WITH PROPOFOL;  Surgeon: Toledo, Boykin Nearing, MD;  Location: [facility];  Service: Gastroenterology;  Laterality: N/A;   DILATION AND CURETTAGE OF UTERUS     ESOPHAGOGASTRODUODENOSCOPY     ESOPHAGOGASTRODUODENOSCOPY (EGD) WITH PROPOFOL N/A 01/24/2018   Procedure: ESOPHAGOGASTRODUODENOSCOPY (EGD) WITH PROPOFOL;  Surgeon: Toledo, Boykin Nearing, MD;  Location: [facility];  Service: Gastroenterology;  Laterality: N/A;   GASTRIC BYPASS  2010   gi bleed  2009   Surgery-was in ICU for three weeks   HERNIA REPAIR     OVARIAN CYST REMOVAL Left 09/29/2015   Procedure: OVARIAN CYSTECTOMY;  Surgeon: [name], MD;  Location: [facility];  Service: Gynecology;  Laterality: Left;   ROUX-EN-Y PROCEDURE     VAGINAL HYSTERECTOMY N/A 09/29/2015   Procedure: HYSTERECTOMY VAGINAL;  Surgeon: [name], MD;  Location: [facility];  Service: Gynecology;  Laterality: N/A;    OB History   No obstetric history on file.      Home Medications    Prior to Admission medications   Medication Sig Start Date End Date Taking? Authorizing Provider  acetaminophen (TYLENOL) 325 MG tablet Take 650 mg by mouth every 4 (four) hours as needed for moderate pain.  04/03/14   [provider]  AIMOVIG 140 MG/ML SOAJ SMARTSIG:140 Milligram(s) SUB-Q  Every 4 Weeks 09/07/21   [provider]  ALPRAZolam (XANAX) 0.25 MG tablet TAKE 1 TABLET BY MOUTH EVERY DAY AS NEEDED FOR UP TO 3 DAYS FOR ANXIETY 04/22/22   [provider]  buPROPion (WELLBUTRIN XL) 150 MG 24 hr tablet Take 1 tablet (150 mg total) by mouth daily. 12/28/22   Jomarie Longs, MD  Calcifediol ER 30 MCG CPCR Take 1 capsule (30 mcg total) by mouth daily. 11/30/22   Imogene Burn, MD  colestipol (COLESTID) 1 g tablet Take 2 tablets (2 g total) by mouth 2 (two) times daily. Patient not taking: Reported on 12/28/2022 05/03/22   Imogene Burn, MD  cyanocobalamin (VITAMIN B12) 1000 MCG/ML  injection Please administer 1000 mcg injection every 3 days 12/01/22   Imogene Burn, MD  diclofenac (VOLTAREN) 75 MG EC tablet Take by mouth. 10/27/22 10/27/23  [provider]  diphenoxylate-atropine (LOMOTIL) 2.5-0.025 MG tablet Take 1 tablet by mouth as needed. 10/12/21   Doree Albee, PA-C  Galcanezumab-gnlm 120 MG/ML SOAJ Inject into the skin. 08/12/22   [provider]  HYDROcodone-acetaminophen (NORCO) 7.5-325 MG tablet Take 1 tablet by mouth every 6 (six) hours as needed for moderate pain.    [provider]  hydrOXYzine (ATARAX) 50 MG tablet Take 1 tablet (50 mg total) by mouth 2 (two) times daily as needed. 12/28/22   Jomarie Longs, MD  lamoTRIgine (LAMICTAL) 200 MG tablet TAKE 1/2 TABLET(100 MG) BY MOUTH TWICE DAILY 12/28/22   Jomarie Longs, MD  lamoTRIgine (LAMICTAL) 25 MG tablet Take 1 tablet (25 mg total) by mouth daily. Take along with 200 mg daily 12/28/22   Jomarie Longs, MD  Multiple Vitamins-Minerals (HM MULTIVITAMIN ADULT GUMMY) CHEW Chew 2 tablets by mouth daily.    [provider]  naloxone [facility]) nasal spray 4 mg/0.1 mL Place into the nose. 10/27/22   [provider]  NONFORMULARY OR COMPOUNDED ITEM calcidiol formulation with 40 mcg vitamin D3 daily 05/07/22   Imogene Burn, MD  pantoprazole (PROTONIX) 40 MG tablet Take 1 tablet (40 mg total) by mouth 2 (two) times daily. 05/07/22   Imogene Burn, MD  SUMAtriptan (IMITREX) 100 MG tablet Take 100 mg by mouth every 2 (two) hours as needed for migraine or headache.     [provider]  topiramate (TOPAMAX) 50 MG tablet Take 100 mg by mouth 2 (two) times daily. 10/16/20   [provider]  Vilazodone HCl (VIIBRYD) 40 MG TABS Take 1 tablet (40 mg total) by mouth daily. 09/06/22   Jomarie Longs, MD  zolpidem (AMBIEN CR) 12.5 MG CR tablet Take 1 tablet (12.5 mg total) by mouth at bedtime. 11/29/22   Jomarie Longs, MD    Family History Family History  Problem  Relation Age of Onset   Arthritis/Rheumatoid Mother    Heart block Mother    Clotting disorder Mother    Osteoporosis Mother    Heart attack Mother    Anxiety disorder Mother    Depression Mother    Colon polyps Mother    Stomach cancer Father 75   Heart disease Father    Heart attack Father    Depression Sister    Anxiety disorder Sister    Colon polyps Sister    Colon cancer Neg Hx    Esophageal cancer Neg Hx    Pancreatic cancer Neg Hx     Social History Social History   Tobacco Use   Smoking status: Never   Smokeless tobacco:  Never  Vaping Use   Vaping status: Never Used  Substance Use Topics   Alcohol use: No    Alcohol/week: 0.0 standard drinks of alcohol   Drug use: No     Allergies   Amoxicillin, Penicillins, Morphine, and Sulfa antibiotics   Review of Systems Review of Systems  Constitutional:  Negative for appetite change, chills, diaphoresis, fatigue and fever.  Genitourinary:  Positive for decreased urine volume, dysuria and frequency. Negative for difficulty urinating, flank pain, genital sores, hematuria, menstrual problem, pelvic pain, urgency, vaginal discharge and vaginal pain.  Musculoskeletal:  Positive for back pain.     Physical Exam Triage Vital Signs ED Triage Vitals  Encounter Vitals Group     BP 01/10/23 1113 115/76     Systolic BP Percentile --      Diastolic BP Percentile --      Pulse Rate 01/10/23 1113 76     Resp 01/10/23 1113 18     Temp 01/10/23 1113 98.2 F (36.8 C)     Temp Source 01/10/23 1113 Oral     SpO2 01/10/23 1113 97 %     Weight --      Height --      Head Circumference --      Peak Flow --      Pain Score 01/10/23 1114 4     Pain Loc --      Pain Education --      Exclude from Growth Chart --    No data found.  Updated Vital Signs BP 115/76 (BP Location: Left Arm)   Pulse 76   Temp 98.2 F (36.8 C) (Oral)   Resp 18   LMP  (LMP Unknown)   SpO2 97%   Visual Acuity Right Eye Distance:   Left Eye  Distance:   Bilateral Distance:    Right Eye Near:   Left Eye Near:    Bilateral Near:     Physical Exam Vitals and nursing note reviewed.  Constitutional:      Appearance: She is not ill-appearing.  HENT:     Head: Normocephalic and atraumatic.     Right Ear: Tympanic membrane and ear canal normal.     Left Ear: Tympanic membrane and ear canal normal.     Mouth/Throat:     Pharynx: Oropharynx is clear.  Eyes:     Conjunctiva/sclera: Conjunctivae normal.  Cardiovascular:     Rate and Rhythm: Normal rate.     Heart sounds: Normal heart sounds.  Pulmonary:     Effort: Pulmonary effort is normal. No respiratory distress.     Breath sounds: Normal breath sounds. No wheezing or rales.  Abdominal:     General: Bowel sounds are normal.     Palpations: Abdomen is soft.     Tenderness: There is no abdominal tenderness. There is no right CVA tenderness, left CVA tenderness or guarding.  Skin:    General: Skin is warm.  Neurological:     Mental Status: She is alert and oriented to person, place, and time.      UC Treatments / Results  Labs (all labs ordered are listed, but only abnormal results are displayed) Labs Reviewed - No data to display  EKG   Radiology No results found.  Procedures Procedures (including critical care time)  Medications Ordered in UC Medications - No data to display  Initial Impression / Assessment and Plan / UC Course  I have reviewed the triage vital signs and the nursing notes.  Pertinent labs & imaging results that were available during my care of the patient were reviewed by me and considered in my medical decision making (see chart for details).    47 year old female with dysuria lower pelvic pressure for 1 week, took leftover antibiotics.  No systemic symptoms, normal exam, point-of-care urinalysis not consistent with UTI.  Will check for BV and yeast given recent antibiotic use.  Will treat with Pyridium.  Warning signs and follow-up  reviewed.  Follow-up if symptoms persist  Final Clinical Impressions(s) / UC Diagnoses   Final diagnoses:  None   Discharge Instructions   None    ED Prescriptions   None    PDMP not reviewed this encounter.   Meliton Rattan, [place] 01/10/23 1143

## 2023-01-11 ENCOUNTER — Ambulatory Visit
Admission: RE | Admit: 2023-01-11 | Discharge: 2023-01-11 | Disposition: A | Discharge status: Home / Self Care | Payer: Medicare Other | Source: Ambulatory Visit | Attending: Student | Admitting: Student

## 2023-01-11 ENCOUNTER — Other Ambulatory Visit: Payer: Self-pay | Admitting: Student

## 2023-01-11 DIAGNOSIS — R531 Weakness: Secondary | ICD-10-CM

## 2023-01-11 DIAGNOSIS — R2990 Unspecified symptoms and signs involving the nervous system: Secondary | ICD-10-CM

## 2023-01-12 LAB — CERVICOVAGINAL ANCILLARY ONLY
Bacterial Vaginitis (gardnerella): NEGATIVE
Candida Glabrata: NEGATIVE
Candida Vaginitis: NEGATIVE
Comment: NEGATIVE
Comment: NEGATIVE
Comment: NEGATIVE

## 2023-01-15 ENCOUNTER — Ambulatory Visit (HOSPITAL_BASED_OUTPATIENT_CLINIC_OR_DEPARTMENT_OTHER)
Admission: EM | Admit: 2023-01-15 | Discharge: 2023-01-15 | Disposition: A | Discharge status: Home / Self Care | Payer: Medicare Other | Attending: Internal Medicine | Admitting: Internal Medicine

## 2023-01-15 ENCOUNTER — Other Ambulatory Visit: Payer: Self-pay

## 2023-01-15 ENCOUNTER — Emergency Department: Payer: Medicare Other

## 2023-01-15 ENCOUNTER — Encounter (HOSPITAL_BASED_OUTPATIENT_CLINIC_OR_DEPARTMENT_OTHER): Payer: Self-pay | Admitting: *Deleted

## 2023-01-15 ENCOUNTER — Emergency Department
Admission: EM | Admit: 2023-01-15 | Discharge: 2023-01-15 | Disposition: A | Discharge status: Home / Self Care | Payer: Medicare Other | Attending: Emergency Medicine | Admitting: Emergency Medicine

## 2023-01-15 DIAGNOSIS — R42 Dizziness and giddiness: Secondary | ICD-10-CM | POA: Diagnosis present

## 2023-01-15 DIAGNOSIS — R5383 Other fatigue: Secondary | ICD-10-CM | POA: Diagnosis not present

## 2023-01-15 DIAGNOSIS — R531 Weakness: Secondary | ICD-10-CM | POA: Diagnosis not present

## 2023-01-15 LAB — DIFFERENTIAL
Abs Immature Granulocytes: 0.02 10*3/uL (ref 0.00–0.07)
Basophils Absolute: 0.1 10*3/uL (ref 0.0–0.1)
Basophils Relative: 1 %
Eosinophils Absolute: 0.1 10*3/uL (ref 0.0–0.5)
Eosinophils Relative: 2 %
Immature Granulocytes: 0 %
Lymphocytes Relative: 28 %
Lymphs Abs: 1.7 10*3/uL (ref 0.7–4.0)
Monocytes Absolute: 0.5 10*3/uL (ref 0.1–1.0)
Monocytes Relative: 9 %
Neutro Abs: 3.6 10*3/uL (ref 1.7–7.7)
Neutrophils Relative %: 60 %

## 2023-01-15 LAB — CBC
HCT: 40.3 % (ref 36.0–46.0)
Hemoglobin: 12.7 g/dL (ref 12.0–15.0)
MCH: 28.5 pg (ref 26.0–34.0)
MCHC: 31.5 g/dL (ref 30.0–36.0)
MCV: 90.6 fL (ref 80.0–100.0)
Platelets: 372 10*3/uL (ref 150–400)
RBC: 4.45 MIL/uL (ref 3.87–5.11)
RDW: 13.7 % (ref 11.5–15.5)
WBC: 6 10*3/uL (ref 4.0–10.5)
nRBC: 0 % (ref 0.0–0.2)

## 2023-01-15 LAB — POCT URINALYSIS DIP (MANUAL ENTRY)
Bilirubin, UA: NEGATIVE
Blood, UA: NEGATIVE
Glucose, UA: NEGATIVE mg/dL
Ketones, POC UA: NEGATIVE mg/dL
Leukocytes, UA: NEGATIVE
Nitrite, UA: NEGATIVE
Protein Ur, POC: NEGATIVE mg/dL
Spec Grav, UA: 1.01 (ref 1.010–1.025)
Urobilinogen, UA: 0.2 U/dL
pH, UA: 6.5 (ref 5.0–8.0)

## 2023-01-15 LAB — COMPREHENSIVE METABOLIC PANEL
ALT: 25 U/L (ref 0–44)
AST: 24 U/L (ref 15–41)
Albumin: 3.9 g/dL (ref 3.5–5.0)
Alkaline Phosphatase: 31 U/L — ABNORMAL LOW (ref 38–126)
Anion gap: 8 (ref 5–15)
BUN: 10 mg/dL (ref 6–20)
CO2: 21 mmol/L — ABNORMAL LOW (ref 22–32)
Calcium: 8.8 mg/dL — ABNORMAL LOW (ref 8.9–10.3)
Chloride: 108 mmol/L (ref 98–111)
Creatinine, Ser: 0.61 mg/dL (ref 0.44–1.00)
GFR, Estimated: >60 mL/min (ref >60)
Glucose, Bld: 74 mg/dL (ref 70–99)
Potassium: 3.4 mmol/L — ABNORMAL LOW (ref 3.5–5.1)
Sodium: 137 mmol/L (ref 135–145)
Total Bilirubin: 0.5 mg/dL (ref <1.2)
Total Protein: 7.1 g/dL (ref 6.5–8.1)

## 2023-01-15 MED ORDER — KETOROLAC TROMETHAMINE 15 MG/ML IJ SOLN
15.0000 mg | Freq: Once | INTRAMUSCULAR | Status: AC
  Administered 2023-01-15: 15 mg via INTRAVENOUS
  Filled 2023-01-15: qty 1

## 2023-01-15 MED ORDER — ACETAMINOPHEN 500 MG PO TABS
1000.0000 mg | ORAL_TABLET | Freq: Once | ORAL | Status: AC
  Administered 2023-01-15: 1000 mg via ORAL
  Filled 2023-01-15: qty 2

## 2023-01-15 MED ORDER — SODIUM CHLORIDE 0.9% FLUSH: 3.0000 mL | Freq: Once | INTRAVENOUS | Status: DC

## 2023-01-15 NOTE — ED Notes (Signed)
Pt returned from MRI °

## 2023-01-15 NOTE — ED Notes (Signed)
Discharge instructions reviewed.   Opportunity for questions and concerns provided.   Encouraged to reach out to neurology team for evaluation.   Alert, oriented and ambulatory on departure.

## 2023-01-15 NOTE — ED Notes (Signed)
Swallow screen passed. Pt able to take PO meds w/o issue.

## 2023-01-15 NOTE — ED Triage Notes (Addendum)
Pt comes with c/o fatigue, left sided weakness and dizziness. Pt states this all started on Thursday. Pt states she was informed to come here.   Pt states blurry vision also. Pt states she feels like she is off balance.   No noticeable drift or facial droop. Pt states blurry vision still present.

## 2023-01-15 NOTE — ED Notes (Signed)
Pt taken to MRI  

## 2023-01-15 NOTE — ED Triage Notes (Signed)
Pt c/o general malaise and fatigue onset 3 days ago. Denies any pain. Denies any cold sxs. 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  Reports UTI sxs 1 wk ago, wishes to be checked for infection.  States saw her neuro last wk and pt had CT scan due to left hemiparesis - was told to go to ED, but pt refused (neuro aware).

## 2023-01-15 NOTE — ED Provider Notes (Signed)
[facility]    CSN: [id number] Arrival date & time: 01/15/23  1023      History   Chief Complaint Chief Complaint  Patient presents with   Fatigue    HPI [name] is a 47 y.o. female.   The history is provided by the patient.  Generalized fatigue, malaise, generalized weakness onset 3 days ago states sleeping more than normal.  States she feels wobbly at times .had chills but no documented fever.  Has had diarrhea which is not uncommon for her but feels it is more frequent. Has been sneezing.  Denies rhinorrhea, nasal congestion, fever, body aches, cough, chest pain, shortness of breath, rashes or skin changes.  Admits some urinary frequency requesting testing for UTI.  History of multiple vitamin deficiencies, degenerative disc disease, diabetes, reflux, migraines, neuropathy had multiple abdominal surgeries including gastric bypass surgery. States she was seen by her neurologist recently who wanted her to go to ED for, she declined, neurologist then ordered outpatient CT scan.  Neurologist reviewed CT scan and recommended she to ED for an MRI she again declined.  She voices concern about her vitamin D and B12 deficiencies is requesting blood work. Denies recent travel or known contacts with illness. Past Medical History:  Diagnosis Date   Abnormal ECG    Anemia    Anxiety    Arthritis    Asthma    B12 deficiency    Cardiomyopathy (HCC)    Chronic abdominal pain    Complication of anesthesia    difficulty to get sedated during endoscopy   COVID-19 01/18/2019   DDD (degenerative disc disease), lumbar    Depression    Diabetes mellitus without complication (HCC)    ONLY gestational diabetes, does not have chronic diabetes   Dysrhythmia    GERD (gastroesophageal reflux disease)    Iron deficiency    Migraine headache    Neuropathy    Obesity    gastric bypass 2009   Ovarian cyst    Pneumonia    within past five years   PTSD (post-traumatic stress  disorder)    PTSD (post-traumatic stress disorder)    Rh negative status during pregnancy    Seizures (HCC)    post-gestational   Small bowel obstruction (HCC)    Vitamin D deficiency     Patient Active Problem List   Diagnosis Date Noted   Hypersomnia 05/05/2022   Osteoarthritis of AC (acromioclavicular) joint (Left) 04/20/2022   Arthralgia of shoulder region (Left) 04/20/2022   Enthesopathy of shoulder (Left) 04/20/2022   Acute pain of left shoulder 04/07/2022   Arthralgia of left acromioclavicular joint 04/06/2022   DDD (degenerative disc disease), cervical 04/06/2022   Redacted shoulder pain (Bilateral) (L>R) 04/06/2022   Chronic shoulder pain (Right) 04/06/2022   Noncompliance with treatment plan 02/25/2022   Abnormal MRI, cervical spine (09/29/2021) ([facility]) 01/11/2022   Cervicogenic headache (Bilateral) 01/11/2022   Occipital neuralgia (Bilateral) 01/11/2022   Vitamin B12 deficiency 12/16/2021   History of idiopathic intracranial hypertension 11/30/2021   Abnormal MRI, lumbar spine (01/28/2017 & 12/11/2021) 11/30/2021   History of bariatric surgery 11/30/2021   Idiopathic small fiber peripheral neuropathy (1ry area of Pain) (feet> hands) 11/30/2021   Chronic feet pain (Bilateral) 11/30/2021   Chronic hand pain (Bilateral) 11/30/2021   Chronic lower extremity pain (3ry area of Pain) (Right) (intermittent) 11/30/2021   Chronic neck pain (4th area of Pain) (Midline) (Left) 11/30/2021   Cervicalgia 11/30/2021   Chronic shoulder pain (5th area of  Pain) (Left) 11/30/2021   Complications of bariatric procedures 11/30/2021   Postoperative intestinal malabsorption 11/30/2021   Chronic pain syndrome 11/28/2021   Pharmacologic therapy 11/28/2021   Disorder of skeletal system 11/28/2021   Problems influencing health status 11/28/2021   Adjustment disorder 10/07/2021   Cervical radiculitis 08/05/2021   MDD (major depressive disorder), recurrent, in partial remission (HCC)  06/09/2020   Primary insomnia 04/03/2020   MDD (major depressive disorder), recurrent, in full remission (HCC) 08/13/2019   Insomnia due to medical condition 05/23/2019   MDD (major depressive disorder), recurrent, severe, with psychosis (HCC) 03/12/2019   MDD (major depressive disorder), recurrent episode, moderate (HCC) 09/15/2018   Panic disorder 09/15/2018   PTSD (post-traumatic stress disorder) 09/15/2018   Insomnia due to mental condition 09/15/2018   Panniculitis 08/01/2018   Neuropathy 07/26/2018   Iron deficiency anemia 11/12/2015   Uterine fibroid 09/30/2015   Abnormal uterine bleeding 09/29/2015   Abdominal pain, generalized 03/27/2015   Abnormal ECG 10/09/2014   Achilles tendinitis 10/09/2014   Anxiety 10/09/2014   DDD (degenerative disc disease), lumbar 10/09/2014   Clinical depression 10/09/2014   Fibroid 06/06/2014   Bariatric surgery status 06/06/2014   Vitamin D deficiency 04/22/2014   Achilles bursitis 04/12/2014   DDD (degenerative disc disease), lumbosacral 04/12/2014   Chronic low back pain (2ry area of Pain) (Right) w/ sciatica (Right) 04/12/2014   Abdominal pain, right upper quadrant 04/12/2014   Diabetes mellitus arising in pregnancy 04/01/2014   Personal history of surgery to heart and great vessels, presenting hazards to health 04/01/2014   Other specified postprocedural states 04/01/2014   Episode of syncope 02/04/2014   Breathlessness on exertion 02/04/2014   Awareness of heartbeats 12/11/2013   Cardiomyopathy (HCC) 10/29/2013   Dizziness 10/29/2013   Mixed incontinence 08/31/2012   Adiposity 08/31/2012   Chronic pain associated with significant psychosocial dysfunction 05/12/2012   Disorder of sacrum 05/12/2012   Lumbar facet arthropathy 05/12/2012    Class: Chronic   Lumbar and sacral osteoarthritis 05/12/2012   Arthralgia, sacroiliac 05/12/2012    Past Surgical History:  Procedure Laterality Date   ABDOMINAL HYSTERECTOMY     BILATERAL  SALPINGECTOMY Bilateral 09/29/2015   Procedure: BILATERAL SALPINGECTOMY;  Surgeon: [name], MD;  Location: [facility];  Service: Gynecology;  Laterality: Bilateral;   bowel obstruction  2011   CHOLECYSTECTOMY     COLONOSCOPY WITH PROPOFOL N/A 01/24/2018   Procedure: COLONOSCOPY WITH PROPOFOL;  Surgeon: Toledo, Boykin Nearing, MD;  Location: [facility];  Service: Gastroenterology;  Laterality: N/A;   DILATION AND CURETTAGE OF UTERUS     ESOPHAGOGASTRODUODENOSCOPY     ESOPHAGOGASTRODUODENOSCOPY (EGD) WITH PROPOFOL N/A 01/24/2018   Procedure: ESOPHAGOGASTRODUODENOSCOPY (EGD) WITH PROPOFOL;  Surgeon: Toledo, Boykin Nearing, MD;  Location: [facility];  Service: Gastroenterology;  Laterality: N/A;   GASTRIC BYPASS  2010   gi bleed  2009   Surgery-was in ICU for three weeks   HERNIA REPAIR     OVARIAN CYST REMOVAL Left 09/29/2015   Procedure: OVARIAN CYSTECTOMY;  Surgeon: [name], MD;  Location: [facility];  Service: Gynecology;  Laterality: Left;   ROUX-EN-Y PROCEDURE     VAGINAL HYSTERECTOMY N/A 09/29/2015   Procedure: HYSTERECTOMY VAGINAL;  Surgeon: [name], MD;  Location: [facility];  Service: Gynecology;  Laterality: N/A;    OB History   No obstetric history on file.      Home Medications    Prior to Admission medications   Medication Sig Start Date End Date Taking? Authorizing Provider  ALPRAZolam (  XANAX) 0.25 MG tablet TAKE 1 TABLET BY MOUTH EVERY DAY AS NEEDED FOR UP TO 3 DAYS FOR ANXIETY 04/22/22  Yes [provider]  buPROPion (WELLBUTRIN XL) 150 MG 24 hr tablet Take 1 tablet (150 mg total) by mouth daily. 12/28/22  Yes Jomarie Longs, MD  Calcifediol ER 30 MCG CPCR Take 1 capsule (30 mcg total) by mouth daily. 11/30/22  Yes Imogene Burn, MD  cyanocobalamin (VITAMIN B12) 1000 MCG/ML injection Please administer 1000 mcg injection every 3 days 12/01/22  Yes Imogene Burn, MD  diclofenac (VOLTAREN) 75 MG EC tablet Take by mouth. 10/27/22 10/27/23 Yes  [provider]  diphenoxylate-atropine (LOMOTIL) 2.5-0.025 MG tablet Take 1 tablet by mouth as needed. 10/12/21  Yes Doree Albee, PA-C  hydrOXYzine (ATARAX) 50 MG tablet Take 1 tablet (50 mg total) by mouth 2 (two) times daily as needed. 12/28/22  Yes Eappen, Levin Bacon, MD  lamoTRIgine (LAMICTAL) 200 MG tablet TAKE 1/2 TABLET(100 MG) BY MOUTH TWICE DAILY 12/28/22  Yes Eappen, Levin Bacon, MD  Multiple Vitamins-Minerals (HM MULTIVITAMIN ADULT GUMMY) CHEW Chew 2 tablets by mouth daily.   Yes [provider]  NONFORMULARY OR COMPOUNDED ITEM calcidiol formulation with 40 mcg vitamin D3 daily 05/07/22  Yes Imogene Burn, MD  pantoprazole (PROTONIX) 40 MG tablet Take 1 tablet (40 mg total) by mouth 2 (two) times daily. 05/07/22  Yes Imogene Burn, MD  phenazopyridine (PYRIDIUM) 200 MG tablet Take 1 tablet (200 mg total) by mouth 3 (three) times daily. 01/10/23  Yes Meliton Rattan, PA  topiramate (TOPAMAX) 50 MG tablet Take 100 mg by mouth 2 (two) times daily. 10/16/20  Yes [provider]  Vilazodone HCl (VIIBRYD) 40 MG TABS Take 1 tablet (40 mg total) by mouth daily. 09/06/22  Yes Eappen, Levin Bacon, MD  zolpidem (AMBIEN CR) 12.5 MG CR tablet Take 1 tablet (12.5 mg total) by mouth at bedtime. 11/29/22  Yes Jomarie Longs, MD  acetaminophen (TYLENOL) 325 MG tablet Take 650 mg by mouth every 4 (four) hours as needed for moderate pain.  04/03/14   [provider]  AIMOVIG 140 MG/ML SOAJ SMARTSIG:140 Milligram(s) SUB-Q Every 4 Weeks 09/07/21   [provider]  colestipol (COLESTID) 1 g tablet Take 2 tablets (2 g total) by mouth 2 (two) times daily. Patient not taking: Reported on 12/28/2022 05/03/22   Imogene Burn, MD  Galcanezumab-gnlm 120 MG/ML SOAJ Inject into the skin. 08/12/22   [provider]  HYDROcodone-acetaminophen (NORCO) 7.5-325 MG tablet Take 1 tablet by mouth every 6 (six) hours as needed for moderate pain.    [provider]   lamoTRIgine (LAMICTAL) 25 MG tablet Take 1 tablet (25 mg total) by mouth daily. Take along with 200 mg daily 12/28/22   Jomarie Longs, MD  naloxone [facility]) nasal spray 4 mg/0.1 mL Place into the nose. 10/27/22   [provider]  SUMAtriptan (IMITREX) 100 MG tablet Take 100 mg by mouth every 2 (two) hours as needed for migraine or headache.     [provider]    Family History Family History  Problem Relation Age of Onset   Arthritis/Rheumatoid Mother    Heart block Mother    Clotting disorder Mother    Osteoporosis Mother    Heart attack Mother    Anxiety disorder Mother    Depression Mother    Colon polyps Mother    Stomach cancer Father 30   Heart disease Father    Heart attack Father  Depression Sister    Anxiety disorder Sister    Colon polyps Sister    Colon cancer Neg Hx    Esophageal cancer Neg Hx    Pancreatic cancer Neg Hx     Social History Social History   Tobacco Use   Smoking status: Never   Smokeless tobacco: Never  Vaping Use   Vaping status: Never Used  Substance Use Topics   Alcohol use: No    Alcohol/week: 0.0 standard drinks of alcohol   Drug use: No     Allergies   Amoxicillin, Penicillins, Morphine, and Sulfa antibiotics   Review of Systems Review of Systems   Physical Exam Triage Vital Signs ED Triage Vitals  Encounter Vitals Group     BP 01/15/23 1115 104/60     Systolic BP Percentile --      Diastolic BP Percentile --      Pulse Rate 01/15/23 1115 85     Resp 01/15/23 1115 16     Temp 01/15/23 1115 97.6 F (36.4 C)     Temp Source 01/15/23 1115 Oral     SpO2 01/15/23 1115 96 %     Weight --      Height --      Head Circumference --      Peak Flow --      Pain Score 01/15/23 1117 0     Pain Loc --      Pain Education --      Exclude from Growth Chart --    No data found.  Updated Vital Signs BP 104/60   Pulse 85   Temp 97.6 F (36.4 C) (Oral)   Resp 16   LMP  (LMP Unknown)   SpO2 96%    Visual Acuity Right Eye Distance:   Left Eye Distance:   Bilateral Distance:    Right Eye Near:   Left Eye Near:    Bilateral Near:     Physical Exam Vitals and nursing note reviewed.  Constitutional:      Appearance: She is not ill-appearing.  HENT:     Head: Normocephalic and atraumatic.     Right Ear: Tympanic membrane and ear canal normal.     Left Ear: Tympanic membrane and ear canal normal.     Nose: No rhinorrhea.     Mouth/Throat:     Mouth: Mucous membranes are moist.     Comments: Geographic pattern on tongue Cardiovascular:     Rate and Rhythm: Normal rate and regular rhythm.     Heart sounds: Normal heart sounds.  Pulmonary:     Effort: Pulmonary effort is normal.     Breath sounds: Normal breath sounds. No wheezing.  Abdominal:     General: Bowel sounds are normal.     Palpations: Abdomen is soft.  Musculoskeletal:     Cervical back: Neck supple.  Lymphadenopathy:     Cervical: No cervical adenopathy.  Skin:    General: Skin is warm and dry.  Neurological:     Mental Status: She is alert and oriented to person, place, and time.  Psychiatric:        Mood and Affect: Mood normal.      UC Treatments / Results  Labs (all labs ordered are listed, but only abnormal results are displayed) Labs Reviewed  POCT URINALYSIS DIP (MANUAL ENTRY)    EKG   Radiology No results found.  Procedures Procedures (including critical care time)  Medications Ordered in UC Medications - No data  to display  Initial Impression / Assessment and Plan / UC Course  I have reviewed the triage vital signs and the nursing notes.  Pertinent labs & imaging results that were available during my care of the patient were reviewed by me and considered in my medical decision making (see chart for details).     47 year old female with extreme fatigue for several days, she has a complicated past medical history including gastric bypass, malabsorption, neuropathy,  degenerative disc disease, cardiomyopathy, neuropathy, migraines, GERD.  Meant ED evaluation as she may benefit from some lab work. Final Clinical Impressions(s) / UC Diagnoses   Final diagnoses:  None   Discharge Instructions   None    ED Prescriptions   None    PDMP not reviewed this encounter.   Meliton Rattan, [place] 01/15/23 1153

## 2023-01-15 NOTE — ED Provider Notes (Signed)
[facility] Provider Note    Event Date/Time   First MD Initiated Contact with Patient 01/15/23 1527     (approximate)   History   Chief Complaint: Dizziness   HPI  [name] is a 47 y.o. female with small fiber neuropathy, B12 and vitamin D deficiency, iron deficiency, managed by hematology and neurology, anxiety and depression managed by psychiatry who comes to the ED due to gradually worsening left-sided weakness and feeling unbalanced which started about a week ago, and has been progressive.  She saw her neurologist and was advised to come to the ED for possible stroke evaluation but patient declined at that time.  Outside records reviewed noting she saw neurology [facility] on 01/11/2023, had outpatient CT head which was normal.  Today she was noticing that her symptoms continued to worsen so she went to urgent care where she was instructed to come to the ED.  Patient denies pain fever neck pain.  She endorses blurry vision, worse in the left eye.  No chest pain or shortness of breath or trauma  Patient endorses intermittent heart palpitations.  No known history of atrial fibrillation.         Physical Exam   Triage Vital Signs: ED Triage Vitals [01/15/23 1433]  Encounter Vitals Group     BP 115/71     Systolic BP Percentile      Diastolic BP Percentile      Pulse Rate 93     Resp 17     Temp 98 F (36.7 C)     Temp src      SpO2 98 %     Weight      Height      Head Circumference      Peak Flow      Pain Score 0     Pain Loc      Pain Education      Exclude from Growth Chart     Most recent vital signs: Vitals:   01/15/23 1630 01/15/23 1730  BP: 92/62 94/68  Pulse: 72 70  Resp: (!) 21 16  Temp:    SpO2: 100% 97%    General: Awake, no distress.  CV:  Good peripheral perfusion.  Regular rate and rhythm Resp:  Normal effort.  Clear to auscultation bilaterally Abd:  No distention.  Other:  Cranial nerves  II through XII intact.  Mild left-sided drift.  Normal finger-nose-finger.  Mild drift left lower extremity.  Normal orientation speech and language. NIH stroke scale 2   ED Results / Procedures / Treatments   Labs (all labs ordered are listed, but only abnormal results are displayed) Labs Reviewed  COMPREHENSIVE METABOLIC PANEL - Abnormal; Notable for the following components:      Result Value   Potassium 3.4 (*)    CO2 21 (*)    Calcium 8.8 (*)    Alkaline Phosphatase 31 (*)    All other components within normal limits  CBC  DIFFERENTIAL  PROTIME-INR  APTT  ETHANOL  CBG MONITORING, ED     EKG Interpreted by me Sinus rhythm rate of 77.  Normal axis, normal intervals.  Normal QRS ST segments and T waves.   RADIOLOGY CT head interpreted by me, unremarkable.  Radiology report reviewed   PROCEDURES:  Procedures   MEDICATIONS ORDERED IN ED: Medications  sodium chloride flush (NS) 0.9 % injection 3 mL ( Intravenous Canceled Entry 01/15/23 1505)  acetaminophen (TYLENOL) tablet 1,000 mg (  1,000 mg Oral Given 01/15/23 1621)  ketorolac (TORADOL) 15 MG/ML injection 15 mg (15 mg Intravenous Given 01/15/23 1845)     IMPRESSION / MDM / ASSESSMENT AND PLAN / ED COURSE  I reviewed the triage vital signs and the nursing notes.  DDx: Polypharmacy, electrolyte abnormality, anemia, CVA  Patient's presentation is most consistent with acute presentation with potential threat to life or bodily function.  Patient presents with left-sided weakness which is observable on exam.  CT head unremarkable.  Labs unremarkable.  Will obtain MRI.   Clinical Course as of 01/15/23 1905  Sat Jan 15, 2023  1903 Patient feels better, headache resolved.  MRI normal.  With subacute symptoms, and  multiple medications for psychiatric and neurologic conditions and vitamin deficiencies, I think this is related to her chronic issues and suitable for continued outpatient evaluation.  Patient is  agreeable. [PS]    Clinical Course User Index [PS] Sharman Cheek, MD     FINAL CLINICAL IMPRESSION(S) / ED DIAGNOSES   Final diagnoses:  Left-sided weakness     Rx / DC Orders   ED Discharge Orders     None        Note:  This document was prepared using Dragon voice recognition software and may include unintentional dictation errors.   Sharman Cheek, MD 01/15/23 Ebony Cargo

## 2023-01-15 NOTE — Discharge Instructions (Signed)
Your lab tests and MRI of the brain today were reassuring.  Please continue to follow-up with your neurology office for further evaluation.

## 2023-01-15 NOTE — Discharge Instructions (Signed)
To the emergency department for further evaluation

## 2023-01-17 ENCOUNTER — Telehealth: Payer: Self-pay | Admitting: *Deleted

## 2023-01-17 ENCOUNTER — Other Ambulatory Visit: Payer: Self-pay | Admitting: Student

## 2023-01-17 DIAGNOSIS — R2990 Unspecified symptoms and signs involving the nervous system: Secondary | ICD-10-CM

## 2023-01-17 DIAGNOSIS — R531 Weakness: Secondary | ICD-10-CM

## 2023-01-17 NOTE — Telephone Encounter (Signed)
Patient called asking if she can come in for lab check as she feels weak, craving Ice, and like she does when her levels are low. Of note she was recently seen in ER fro left sided weakness, but released and she cancelled her last doctor appointment due to her daughter being sick and never rescheduled it but did have labs checked since then at [facility]. Please advise  IMPRESSION / MDM / ASSESSMENT AND PLAN / ED COURSE  I reviewed the triage vital signs and the nursing notes.   DDx: Polypharmacy, electrolyte abnormality, anemia, CVA   Patient's presentation is most consistent with acute presentation with potential threat to life or bodily function.   Patient presents with left-sided weakness which is observable on exam.  CT head unremarkable.  Labs unremarkable.  Will obtain MRI.        Clinical Course as of 01/15/23 1905  Sat Jan 15, 2023  1903 Patient feels better, headache resolved.  MRI normal.  With subacute symptoms, and  multiple medications for psychiatric and neurologic conditions and vitamin deficiencies, I think this is related to her chronic issues and suitable for continued outpatient evaluation.  Patient is agreeable. [PS]     Clinical Course User Index [PS] Sharman Cheek, MD

## 2023-01-17 NOTE — Telephone Encounter (Signed)
I called and spoke with patient who states that she went to see her PCP today who drew a bunch of labs but not a Ferritin and that her last Ferritin level in June was 5 drawn by her neurologist and nothing was done for it. I asked if she would call her PCP and ask them to add a Ferritin on to her labs to see where it is now.

## 2023-01-24 ENCOUNTER — Other Ambulatory Visit: Payer: Self-pay | Admitting: Psychiatry

## 2023-01-24 DIAGNOSIS — F4310 Post-traumatic stress disorder, unspecified: Secondary | ICD-10-CM

## 2023-01-26 ENCOUNTER — Encounter: Payer: Self-pay | Admitting: *Deleted

## 2023-01-28 ENCOUNTER — Other Ambulatory Visit: Payer: Self-pay | Admitting: *Deleted

## 2023-01-28 DIAGNOSIS — D509 Iron deficiency anemia, unspecified: Secondary | ICD-10-CM

## 2023-01-28 IMAGING — MR MR HEAD W/O CM
13 series · 48 of 48 positions shown · non-contrast
Comparison: Head CT 07/21/2012. Brain MRI 12/08/2009.

CLINICAL DATA: Provided history: Mild cognitive impairment.
Additional history provided by scanning technologist: Patient
reports chronic migraines, worsening short-term memory, episodes of
confusion and word-finding difficulty.

EXAM:
MRI HEAD WITHOUT CONTRAST
TECHNIQUE: Multiplanar, multiecho pulse sequences of the brain and surrounding
structures were obtained without intravenous contrast.

[Series 5: ax dwi_tracew · axial · 3.0mm · 0.65mm/px · z∈[-117,+33]mm · 2 of 48 slices shown]
[im 1/48]
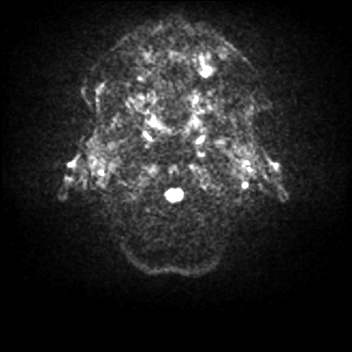
[im 48/48]
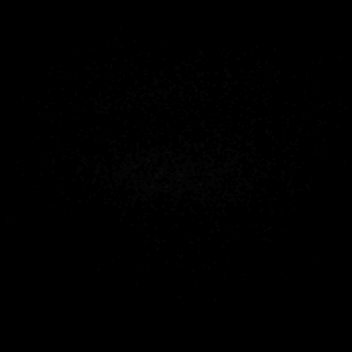

[Series 6: ax dwi_adc · axial · 3.0mm · 0.65mm/px · z∈[-117,+30]mm · 2 of 47 slices shown]
[im 1/47]
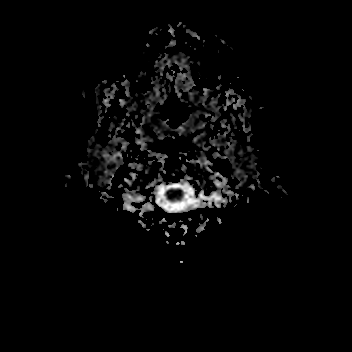
[im 47/47]
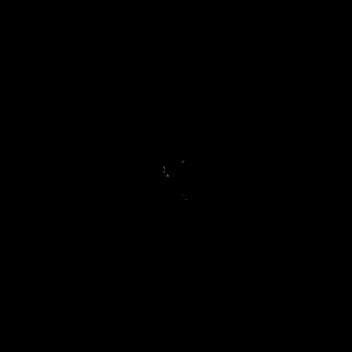

[Series 7: cor dwi_tracew · coronal · 5.0mm · 0.65mm/px · 3 of 40 slices shown]
[im 1/40]
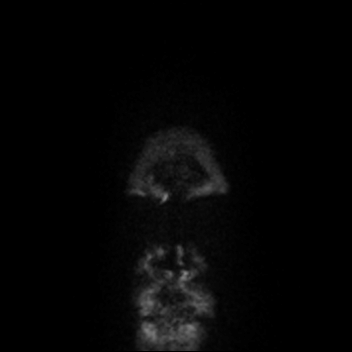
[im 20/40]
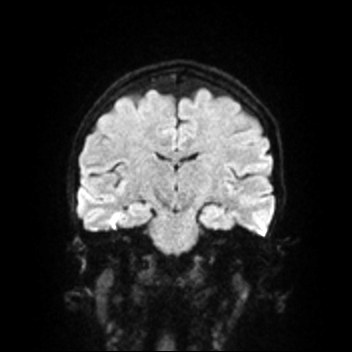
[im 40/40]
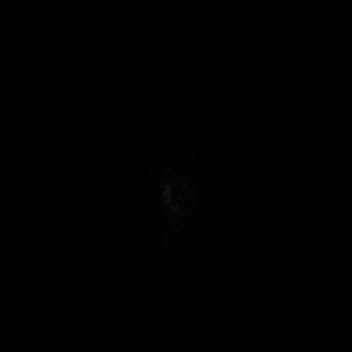

[Series 8: cor dwi_adc · coronal · 5.0mm · 0.65mm/px · 3 of 38 slices shown]
[im 1/38]
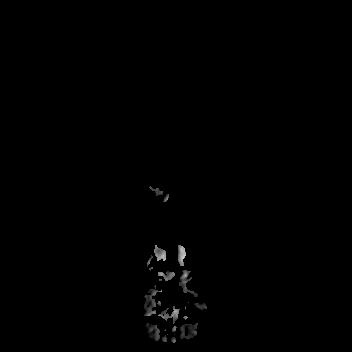
[im 19/38]
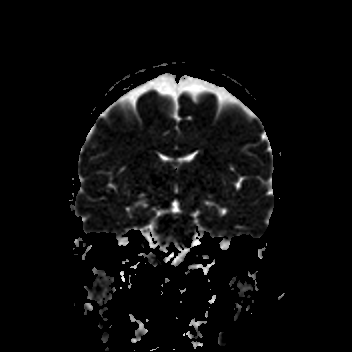
[im 38/38]
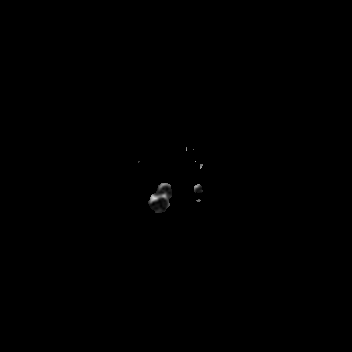

[Series 9: T1 · sagittal · 5.0mm · 0.62mm/px · 2 of 25 slices shown (1 of 2)]
[im 1/25]
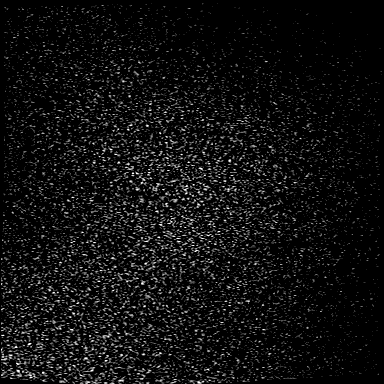
[im 25/25]
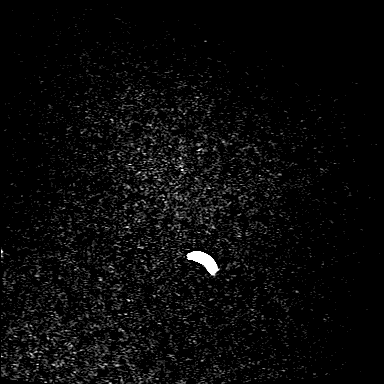

[Series 10: T2 · axial · 5.0mm · 0.53mm/px · z∈[-114,+24]mm · 2 of 25 slices shown (1 of 2)]
[im 1/25]
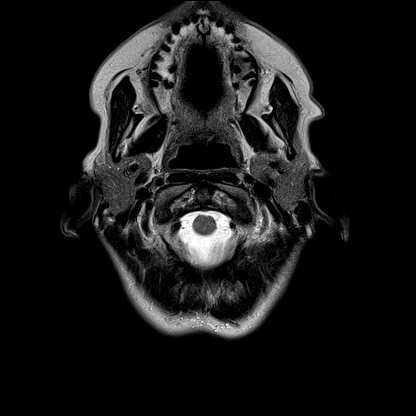
[im 25/25]
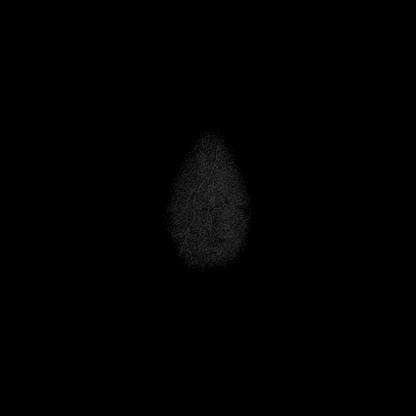

[Series 11: mag_images · axial · 3.0mm · 0.90mm/px · z∈[-131,+39]mm · 4 of 60 slices shown]
[im 1/60]
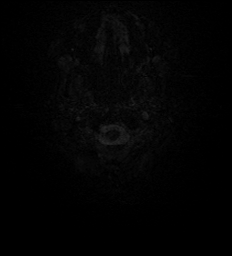
[im 20/60]
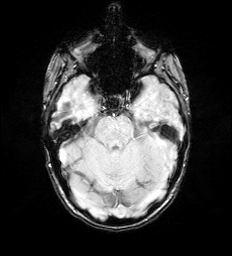
[im 40/60]
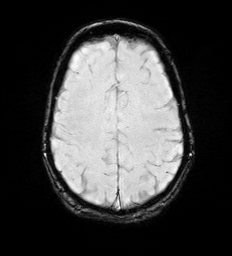
[im 60/60]
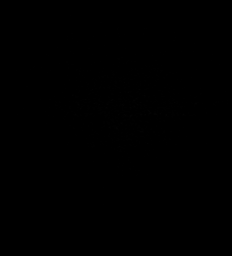

[Series 12: pha_images · axial · 3.0mm · 0.90mm/px · z∈[-131,+39]mm · 4 of 57 slices shown]
[im 1/57]
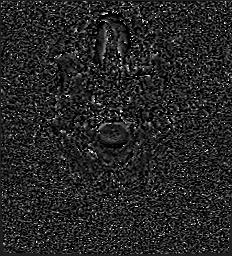
[im 19/57]
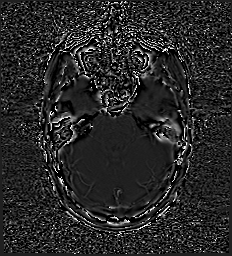
[im 38/57]
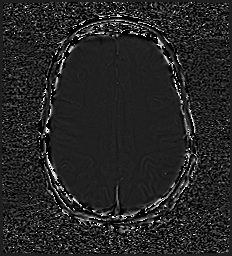
[im 57/57]
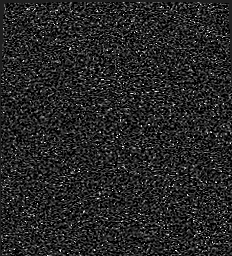

[Series 13: swi_images · axial · 3.0mm · 0.90mm/px · z∈[-131,+39]mm · 4 of 60 slices shown]
[im 1/60]
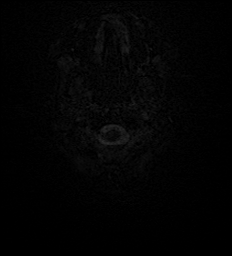
[im 20/60]
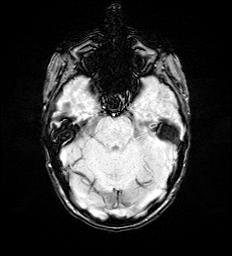
[im 40/60]
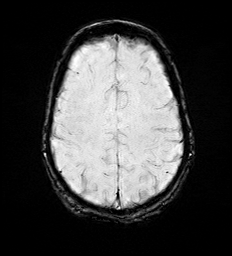
[im 60/60]
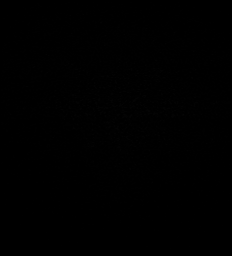

[Series 14: mip_images(sw) · axial · 24.0mm · 0.90mm/px · z∈[-121,+29]mm · 4 of 53 slices shown]
[im 1/53]
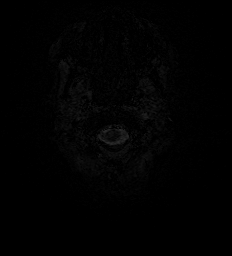
[im 18/53]
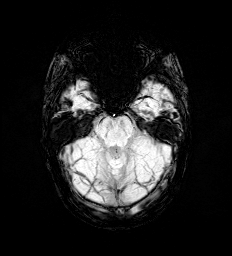
[im 35/53]
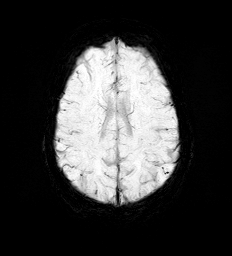
[im 53/53]
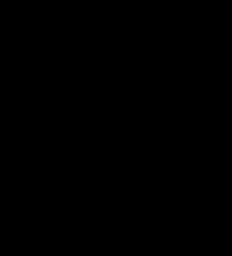

[Series 15: FLAIR · axial · 3.0mm · 0.53mm/px · z∈[-123,+33]mm · 4 of 55 slices shown]
[im 1/55]
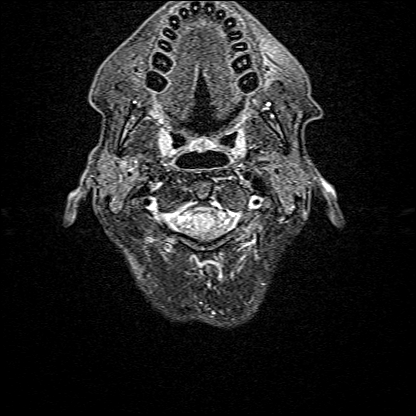
[im 19/55]
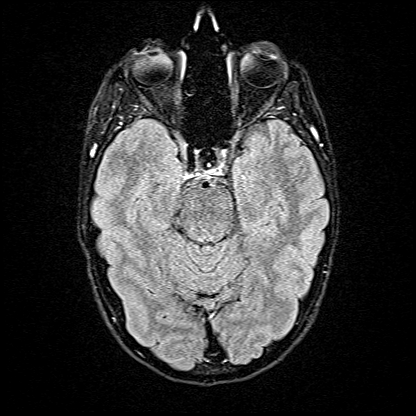
[im 37/55]
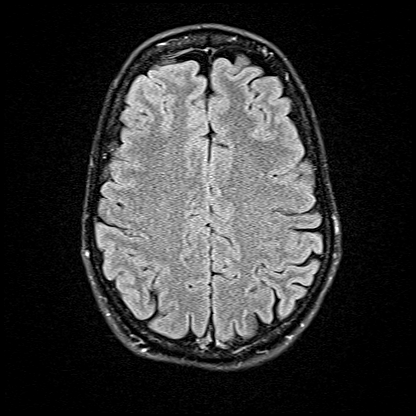
[im 55/55]
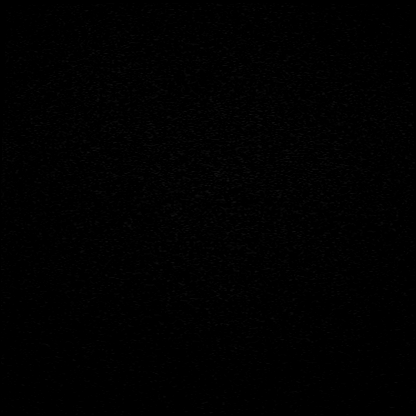

[Series 16: T1 · axial · 1.0mm · 0.98mm/px · z∈[-133,+35]mm · 12 of 176 slices shown (2 of 2)]
[im 1/176]
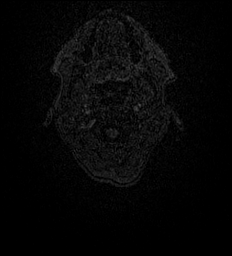
[im 16/176]
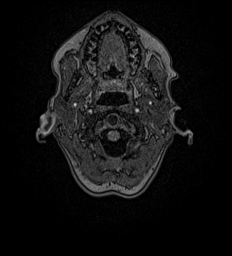
[im 32/176]
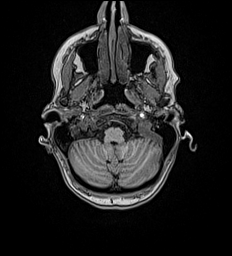
[im 48/176]
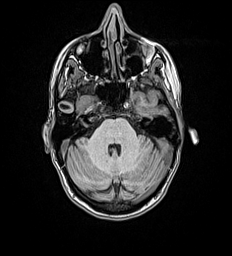
[im 64/176]
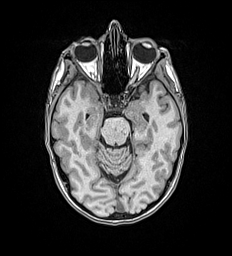
[im 80/176]
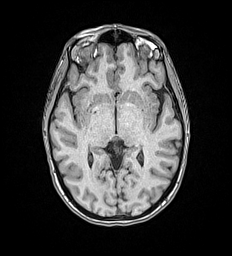
[im 96/176]
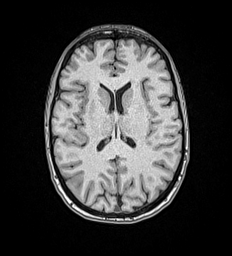
[im 112/176]
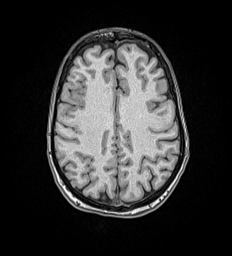
[im 128/176]
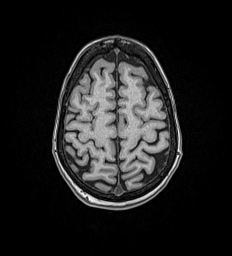
[im 144/176]
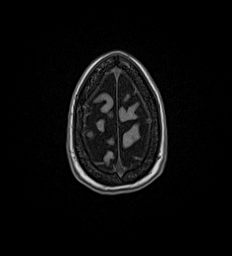
[im 160/176]
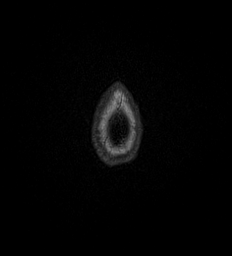
[im 176/176]
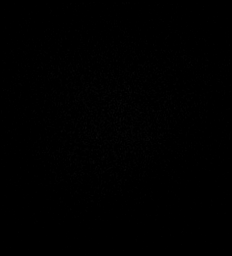

[Series 17: T2 · coronal · 5.0mm · 0.57mm/px · 2 of 29 slices shown (2 of 2)]
[im 1/29]
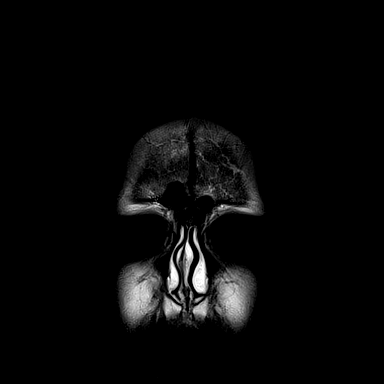
[im 29/29]
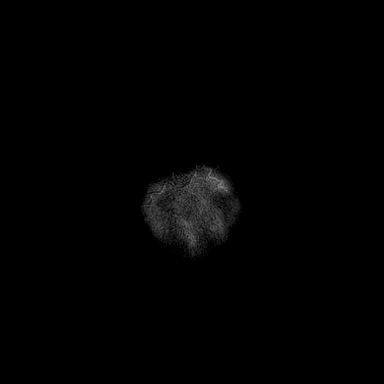

[48 of 48 positions shown; findings below may reference images not displayed]

FINDINGS: Brain:

Cerebral volume is normal.

No cortical encephalomalacia is identified. No significant cerebral
white matter disease.

There is no acute infarct.

No evidence of an intracranial mass.

No chronic intracranial blood products.

No extra-axial fluid collection.

No midline shift.

Vascular: Maintained flow voids within the proximal large arterial
vessels.

Skull and upper cervical spine: No focal suspicious marrow lesion.

Sinuses/Orbits: No mass or acute finding within the imaged orbits.
Mucous retention cysts within the inferior right maxillary sinus
measuring up to 13 mm. Small-volume frothy secretions within the
bilateral sphenoid sinuses.
IMPRESSION: Unremarkable non-contrast MRI appearance of the brain. No evidence
of acute intracranial abnormality.

Paranasal sinus disease, as described.

## 2023-01-29 ENCOUNTER — Other Ambulatory Visit: Payer: Medicare Other

## 2023-01-31 ENCOUNTER — Other Ambulatory Visit: Payer: Self-pay | Admitting: *Deleted

## 2023-01-31 DIAGNOSIS — D509 Iron deficiency anemia, unspecified: Secondary | ICD-10-CM

## 2023-02-01 ENCOUNTER — Ambulatory Visit
Admission: RE | Admit: 2023-02-01 | Discharge: 2023-02-01 | Disposition: A | Discharge status: Home / Self Care | Payer: Medicare Other | Source: Ambulatory Visit | Attending: Student | Admitting: Student

## 2023-02-01 ENCOUNTER — Inpatient Hospital Stay: Payer: Medicare Other | Attending: Oncology

## 2023-02-01 DIAGNOSIS — D509 Iron deficiency anemia, unspecified: Secondary | ICD-10-CM | POA: Insufficient documentation

## 2023-02-01 DIAGNOSIS — R531 Weakness: Secondary | ICD-10-CM

## 2023-02-01 DIAGNOSIS — R2990 Unspecified symptoms and signs involving the nervous system: Secondary | ICD-10-CM

## 2023-02-01 LAB — CBC WITH DIFFERENTIAL (CANCER CENTER ONLY)
Abs Immature Granulocytes: 0.02 10*3/uL (ref 0.00–0.07)
Basophils Absolute: 0.1 10*3/uL (ref 0.0–0.1)
Basophils Relative: 1 %
Eosinophils Absolute: 0.2 10*3/uL (ref 0.0–0.5)
Eosinophils Relative: 4 %
HCT: 41.1 % (ref 36.0–46.0)
Hemoglobin: 12.9 g/dL (ref 12.0–15.0)
Immature Granulocytes: 0 %
Lymphocytes Relative: 28 %
Lymphs Abs: 1.6 10*3/uL (ref 0.7–4.0)
MCH: 29 pg (ref 26.0–34.0)
MCHC: 31.4 g/dL (ref 30.0–36.0)
MCV: 92.4 fL (ref 80.0–100.0)
Monocytes Absolute: 0.5 10*3/uL (ref 0.1–1.0)
Monocytes Relative: 8 %
Neutro Abs: 3.3 10*3/uL (ref 1.7–7.7)
Neutrophils Relative %: 59 %
Platelet Count: 361 10*3/uL (ref 150–400)
RBC: 4.45 MIL/uL (ref 3.87–5.11)
RDW: 13.8 % (ref 11.5–15.5)
WBC Count: 5.7 10*3/uL (ref 4.0–10.5)
nRBC: 0 % (ref 0.0–0.2)

## 2023-02-01 LAB — IRON AND TIBC
Iron: 54 ug/dL (ref 28–170)
Saturation Ratios: 11 % (ref 10.4–31.8)
TIBC: 517 ug/dL — ABNORMAL HIGH (ref 250–450)
UIBC: 463 ug/dL

## 2023-02-01 LAB — FERRITIN: Ferritin: 4 ng/mL — ABNORMAL LOW (ref 11–307)

## 2023-02-01 MED ORDER — GADOPICLENOL 0.5 MMOL/ML IV SOLN
7.5000 mL | Freq: Once | INTRAVENOUS | Status: AC | PRN
  Administered 2023-02-01: 7.5 mL via INTRAVENOUS

## 2023-02-02 ENCOUNTER — Telehealth: Payer: Self-pay | Admitting: *Deleted

## 2023-02-02 NOTE — Telephone Encounter (Signed)
Patient called stating that her results are low and asking if she needs an infusion           Component Ref Range & Units (hover) 1 d ago (02/01/23) 1 yr ago (07/02/21) 1 yr ago (03/03/21) 2 yr ago (09/26/20) 2 yr ago (04/24/20) 3 yr ago (12/21/19) 6 yr ago (11/01/16)  Ferritin 4 Low  24 CM 60 CM 42 CM 49 CM 4 Low  CM 14            Component Ref Range & Units (hover) 1 d ago (02/01/23) 1 yr ago (07/02/21) 1 yr ago (03/03/21) 2 yr ago (09/26/20) 2 yr ago (04/24/20) 3 yr ago (12/21/19) 6 yr ago (11/01/16)  Iron 54 128 55 94 124 30 154  TIBC 517 High  395 392 372 371 543 High  530 High   Saturation Ratios 11 32 High  14 25 33 High  6 Low  29  UIBC 463 267 CM 337 CM 278 CM 247 CM 513 CM 376   Component Ref Range & Units (hover) 1 d ago (02/01/23) 2 wk ago (01/15/23) 2 wk ago (01/15/23) 1 yr ago (12/21/21) 1 yr ago (07/02/21) 1 yr ago (03/13/21) 1 yr ago (03/05/21)  WBC Count 5.7  6.0 6.8 6.4 8.4 7.2  RBC 4.45  4.45 4.50 4.50 4.77 4.78  Hemoglobin 12.9  12.7 13.1 13.8 14.0 14.3  HCT 41.1  40.3 40.9 42.4 43.8 43.8  MCV 92.4  90.6 90.9 94.2 91.8 91.6  MCH 29.0  28.5 29.1 30.7 29.4 29.9  MCHC 31.4  31.5 32.0 32.5 32.0 32.6  RDW 13.8  13.7 13.2 12.9 12.6 13.0  Platelet Count 361  372 341 313 270 398  nRBC 0.0  0.0 CM 0.0 0.0 0.0 CM 0.0 CM  Neutrophils Relative % 59 60  67 65    Neutro Abs 3.3 3.6  4.5 4.2    Lymphocytes Relative 28 28  24 24     Lymphs Abs 1.6 1.7  1.6 1.5    Monocytes Relative 8 9  7 7     Monocytes Absolute 0.5 0.5  0.5 0.5    Eosinophils Relative 4 2  1 2     Eosinophils Absolute 0.2 0.1  0.1 0.1    Basophils Relative 1 1  1 1     Basophils Absolute 0.1 0.1  0.1 0.0    Immature Granulocytes 0 0  0 1    Abs Immature Granulocytes 0.02 0.02 CM  0.03 CM 0.03 CM    Comment: Performed at [facility], [address] [place], [place] [address]  Resulting Agency [facility]        Specimen  Collected: 02/01/23 11:52 Last Resulted: 02/01/23 12:05

## 2023-02-03 ENCOUNTER — Telehealth: Payer: Self-pay | Admitting: Oncology

## 2023-02-03 ENCOUNTER — Encounter: Payer: Self-pay | Admitting: Oncology

## 2023-02-03 NOTE — Telephone Encounter (Signed)
Per inbasket- appointments scheduled- tried to call patient x2 and no answer- no voicemail- mailed appointment reminder.

## 2023-02-22 ENCOUNTER — Ambulatory Visit: Payer: Medicare Other | Admitting: Internal Medicine

## 2023-02-24 ENCOUNTER — Inpatient Hospital Stay: Payer: Medicare Other

## 2023-02-24 ENCOUNTER — Inpatient Hospital Stay: Payer: Medicare Other | Attending: Oncology | Admitting: Oncology

## 2023-02-24 ENCOUNTER — Encounter: Payer: Self-pay | Admitting: Oncology

## 2023-02-24 VITALS — BP 115/74 | HR 78 | Temp 97.2°F | Resp 16 | Ht 67.0 in | Wt 148.0 lb

## 2023-02-24 VITALS — BP 123/81 | HR 71

## 2023-02-24 DIAGNOSIS — D508 Other iron deficiency anemias: Secondary | ICD-10-CM

## 2023-02-24 DIAGNOSIS — D509 Iron deficiency anemia, unspecified: Secondary | ICD-10-CM | POA: Insufficient documentation

## 2023-02-24 DIAGNOSIS — Z9884 Bariatric surgery status: Secondary | ICD-10-CM | POA: Diagnosis not present

## 2023-02-24 MED ORDER — IRON SUCROSE 20 MG/ML IV SOLN
200.0000 mg | Freq: Once | INTRAVENOUS | Status: AC
  Administered 2023-02-24: 200 mg via INTRAVENOUS
  Filled 2023-02-24: qty 10

## 2023-02-24 NOTE — Progress Notes (Signed)
 [facility]  [phone] [phone]  ID: TENASIA AULL OB: September 01, 1975  MR#: [id number]  RDW#:[id number]  Patient Care Team: Loring Tanda Mae, MD as PCP - General (Family Medicine) Jacobo Evalene PARAS, MD as Consulting Physician (Oncology)  CHIEF COMPLAINT: Iron  deficiency anemia.   INTERVAL HISTORY: Patient last seen in clinic nearly 3 years ago.  She reports she was in her usual state of health until 1 to 2 months ago when she started having increased weakness and fatigue.  She also has been taking care of her father who has cancer over the same timeframe.  She otherwise feels well.  She has no neurologic complaints.  She denies any recent fevers or illnesses.  She has a good appetite and denies weight loss.  She has no chest pain, shortness of breath, cough, or hemoptysis.  She denies any nausea, vomiting, constipation, or diarrhea.  She has no melena or hematochezia.  She has no urinary complaints.  Patient offers no further specific complaints today.  REVIEW OF SYSTEMS:   Review of Systems  Constitutional:  Positive for malaise/fatigue. Negative for fever and weight loss.  Respiratory: Negative.  Negative for cough and shortness of breath.   Cardiovascular: Negative.  Negative for chest pain and leg swelling.  Gastrointestinal: Negative.  Negative for abdominal pain, blood in stool and melena.  Genitourinary: Negative.  Negative for hematuria.  Musculoskeletal: Negative.  Negative for back pain, joint pain and neck pain.  Skin: Negative.  Negative for rash.  Neurological:  Positive for weakness. Negative for dizziness.  Psychiatric/Behavioral: Negative.  Negative for depression. The patient is not nervous/anxious.     As per HPI. Otherwise, a complete review of systems is negative.  PAST MEDICAL HISTORY: Past Medical History:  Diagnosis Date   Abnormal ECG    Anemia    Anxiety    Arthritis    Asthma    B12 deficiency     Cardiomyopathy (HCC)    Chronic abdominal pain    Complication of anesthesia    difficulty to get sedated during endoscopy   COVID-19 01/18/2019   DDD (degenerative disc disease), lumbar    Depression    Diabetes mellitus without complication (HCC)    ONLY gestational diabetes, does not have chronic diabetes   Dysrhythmia    GERD (gastroesophageal reflux disease)    Iron  deficiency    Migraine headache    Neuropathy    Obesity    gastric bypass 2009   Ovarian cyst    Pneumonia    within past five years   PTSD (post-traumatic stress disorder)    PTSD (post-traumatic stress disorder)    Rh negative status during pregnancy    Seizures (HCC)    post-gestational   Small bowel obstruction (HCC)    Vitamin D  deficiency     PAST SURGICAL HISTORY: Past Surgical History:  Procedure Laterality Date   ABDOMINAL HYSTERECTOMY     BILATERAL SALPINGECTOMY Bilateral 09/29/2015   Procedure: BILATERAL SALPINGECTOMY;  Surgeon: Heather Penton, MD;  Location: [facility] OR[facility];  Service: Gynecology;  Laterality: Bilateral;   bowel obstruction  2011   CHOLECYSTECTOMY     COLONOSCOPY WITH PROPOFOL  N/A 01/24/2018   Procedure: COLONOSCOPY WITH PROPOFOL ;  Surgeon: Toledo, Ladell POUR, MD;  Location: [facility];  Service: Gastroenterology;  Laterality: N/A;   DILATION AND CURETTAGE OF UTERUS     ESOPHAGOGASTRODUODENOSCOPY     ESOPHAGOGASTRODUODENOSCOPY (EGD) WITH PROPOFOL  N/A 01/24/2018   Procedure: ESOPHAGOGASTRODUODENOSCOPY (EGD) WITH PROPOFOL ;  Surgeon: Vanoss,  Teodoro K, MD;  Location: [facility] END[facility];  Service: Gastroenterology;  Laterality: N/A;   GASTRIC BYPASS  2010   gi bleed  2009   Surgery-was in ICU for three weeks   HERNIA REPAIR     OVARIAN CYST REMOVAL Left 09/29/2015   Procedure: OVARIAN CYSTECTOMY;  Surgeon: Heather Penton, MD;  Location: [facility] OR[facility];  Service: Gynecology;  Laterality: Left;   ROUX-EN-Y PROCEDURE     VAGINAL HYSTERECTOMY N/A 09/29/2015   Procedure: HYSTERECTOMY VAGINAL;   Surgeon: Heather Penton, MD;  Location: [facility];  Service: Gynecology;  Laterality: N/A;    FAMILY HISTORY Family History  Problem Relation Age of Onset   Arthritis/Rheumatoid Mother    Heart block Mother    Clotting disorder Mother    Osteoporosis Mother    Heart attack Mother    Anxiety disorder Mother    Depression Mother    Colon polyps Mother    Stomach cancer Father 42   Heart disease Father    Heart attack Father    Depression Sister    Anxiety disorder Sister    Colon polyps Sister    Colon cancer Neg Hx    Esophageal cancer Neg Hx    Pancreatic cancer Neg Hx        ADVANCED DIRECTIVES:    HEALTH MAINTENANCE: Social History   Tobacco Use   Smoking status: Never   Smokeless tobacco: Never  Vaping Use   Vaping status: Never Used  Substance Use Topics   Alcohol use: No    Alcohol/week: 0.0 standard drinks of alcohol   Drug use: No     Colonoscopy:  PAP:  Bone density:  Lipid panel:  Allergies  Allergen Reactions   Amoxicillin Anaphylaxis, Hives, Shortness Of Breath, Swelling and Rash    Has patient had a PCN reaction causing immediate rash, facial/tongue/throat swelling, SOB or lightheadedness with hypotension: Yes Has patient had a PCN reaction causing severe rash involving mucus membranes or skin necrosis: Yes Has patient had a PCN reaction that required hospitalization No Has patient had a PCN reaction occurring within the last 10 years: No If all of the above answers are NO, then may proceed with Cephalosporin use. Other reaction(s): ANAPHYLAXIS Other reaction(s): ANAPHYLAX   Penicillins Anaphylaxis, Swelling, Rash, Shortness Of Breath and Hives    Has patient had a PCN reaction causing immediate rash, facial/tongue/throat swelling, SOB or lightheadedness with hypotension: Yes Has patient had a PCN reaction causing severe rash involving mucus membranes or skin necrosis: Yes Has patient had a PCN reaction that required hospitalization No Has  patient had a PCN reaction occurring within the last 10 years: No If all of the above answers are NO, then may proceed with Cephalosporin use.  Other reaction(s): ANAPHYLAXIS Other reaction(s): ANAPHYLAXIS Has patient had a PCN reaction causing immediate rash, facial/tongue/throat swelling, SOB or lightheadedness with hypotension: Yes Has patient had a PCN reaction causing severe rash involving mucus membranes or skin necrosis: Yes Has patient had a PCN reaction that required hospitalization No Has patient had a PCN reaction occurring within the last 10 years: No If all of the above answers are NO, then may proceed with Cephalosporin use. Other reaction(s): ANAPHYLAXIS Other reaction(s): ANAPHYLAX Has patient had a PCN reaction causing immediate rash, facial/tongue/throat swelling, SOB or lightheadedness with hypotension: Yes Has patient had a PCN reaction causing severe rash involving mucus membranes or skin necrosis: Yes Has patient had a PCN reaction that required hospitalization No Has patient had a PCN reaction occurring  within the last 10 years: No If all of the above answers are NO, then may proceed with Cephalosporin use. Other reaction(s): ANAPHYLAXIS Other reaction(s): ANAPHYLAXIS    Morphine Other (See Comments)    Muscle spasms   Sulfa Antibiotics Nausea Only    Other reaction(s): VOMITING Other reaction(s): VOMITING Other reaction(s): VOMITING    Current Outpatient Medications  Medication Sig Dispense Refill   acetaminophen  (TYLENOL ) 325 MG tablet Take 650 mg by mouth every 4 (four) hours as needed for moderate pain.      ALPRAZolam  (XANAX ) 0.25 MG tablet TAKE 1 TABLET BY MOUTH EVERY DAY AS NEEDED FOR UP TO 3 DAYS FOR ANXIETY     buPROPion  (WELLBUTRIN  XL) 150 MG 24 hr tablet Take 1 tablet (150 mg total) by mouth daily. 90 tablet 1   Calcifediol  ER 30 MCG CPCR Take 1 capsule (30 mcg total) by mouth daily. 30 capsule 3   cyanocobalamin  (VITAMIN B12) 1000 MCG/ML  injection Please administer 1000 mcg injection every 3 days 10 mL 4   diphenoxylate -atropine  (LOMOTIL ) 2.5-0.025 MG tablet Take 1 tablet by mouth as needed. 60 tablet 0   estradiol (VIVELLE-DOT) 0.1 MG/24HR patch Place 1 patch onto the skin 2 (two) times a week.     HYDROcodone-acetaminophen  (NORCO) 7.5-325 MG tablet Take 1 tablet by mouth every 6 (six) hours as needed for moderate pain.     hydrOXYzine  (ATARAX ) 50 MG tablet Take 1 tablet (50 mg total) by mouth 2 (two) times daily as needed. 60 tablet 2   lamoTRIgine  (LAMICTAL ) 200 MG tablet TAKE 1/2 TABLET(100 MG) BY MOUTH TWICE DAILY 90 tablet 1   Multiple Vitamins-Minerals (HM MULTIVITAMIN ADULT GUMMY) CHEW Chew 2 tablets by mouth daily.     NONFORMULARY OR COMPOUNDED ITEM calcidiol formulation with 40 mcg vitamin D3 daily 30 each 3   pantoprazole  (PROTONIX ) 40 MG tablet Take 1 tablet (40 mg total) by mouth 2 (two) times daily. 60 tablet 3   SUMAtriptan  (IMITREX ) 100 MG tablet Take 100 mg by mouth every 2 (two) hours as needed for migraine or headache.      topiramate  (TOPAMAX ) 50 MG tablet Take 100 mg by mouth 2 (two) times daily.     Vilazodone  HCl (VIIBRYD ) 40 MG TABS Take 1 tablet (40 mg total) by mouth daily. 90 tablet 1   zolpidem  (AMBIEN  CR) 12.5 MG CR tablet Take 1 tablet (12.5 mg total) by mouth at bedtime. 30 tablet 5   No current facility-administered medications for this visit.    OBJECTIVE: Vitals:   02/24/23 0926  BP: 115/74  Pulse: 78  Resp: 16  Temp: (!) 97.2 F (36.2 C)  SpO2: 100%     Body mass index is 23.18 kg/m.    ECOG FS:0 - Asymptomatic  General: Well-developed, well-nourished, no acute distress. Eyes: Pink conjunctiva, anicteric sclera. HEENT: Normocephalic, moist mucous membranes. Lungs: No audible wheezing or coughing. Heart: Regular rate and rhythm. Abdomen: Soft, nontender, no obvious distention. Musculoskeletal: No edema, cyanosis, or clubbing. Neuro: Alert, answering all questions appropriately.  Cranial nerves grossly intact. Skin: No rashes or petechiae noted. Psych: Normal affect.  LAB RESULTS:  Lab Results  Component Value Date   NA 137 01/15/2023   K 3.4 (L) 01/15/2023   CL 108 01/15/2023   CO2 21 (L) 01/15/2023   GLUCOSE 74 01/15/2023   BUN 10 01/15/2023   CREATININE 0.61 01/15/2023   CALCIUM  8.8 (L) 01/15/2023   PROT 7.1 01/15/2023   ALBUMIN  3.9 01/15/2023   AST  24 01/15/2023   ALT 25 01/15/2023   ALKPHOS 31 (L) 01/15/2023   BILITOT 0.5 01/15/2023   GFRNONAA >60 01/15/2023   GFRAA >60 04/26/2018    Lab Results  Component Value Date   WBC 5.7 02/01/2023   NEUTROABS 3.3 02/01/2023   HGB 12.9 02/01/2023   HCT 41.1 02/01/2023   MCV 92.4 02/01/2023   PLT 361 02/01/2023   Lab Results  Component Value Date   FERRITIN 4 (L) 02/01/2023   Lab Results  Component Value Date   IRON  54 02/01/2023   TIBC 517 (H) 02/01/2023   IRONPCTSAT 11 02/01/2023      STUDIES: MR BRAIN W WO CONTRAST Result Date: 02/07/2023 CLINICAL DATA:  Provided history: Stroke-like symptoms. Left-sided weakness. Additional history provided by the scanning technologist: unsteady gait, blurry vision, speech difficulty. EXAM: MRI HEAD WITHOUT AND WITH CONTRAST TECHNIQUE: Multiplanar, multiecho pulse sequences of the brain and surrounding structures were obtained without and with intravenous contrast. CONTRAST:  7.5 mL of in view COMPARISON:  Brain MRI 01/15/2023. FINDINGS: Brain: Cerebral volume is normal. Partially empty sella turcica. No cortical encephalomalacia is identified. No significant cerebral white matter disease. There is no acute infarct. No evidence of an intracranial mass. No chronic intracranial blood products. No extra-axial fluid collection. No midline shift. No pathologic intracranial enhancement identified. Vascular: Maintained flow voids within the proximal large arterial vessels. Small developmental venous anomaly within the anteroinferior right frontal lobe (anatomic  variant). Skull and upper cervical spine: No focal worrisome marrow lesion. Sinuses/Orbits: No mass or acute finding within the imaged orbits. 15 mm mucous retention cyst within the right maxillary sinus. Mild mucosal thickening within the right sphenoid sinus. IMPRESSION: 1. No evidence of an acute intracranial abnormality. 2. Partially empty sella turcica. This finding can reflect incidental anatomic variation, or alternatively, it can be associated with chronic idiopathic intracranial hypertension (pseudotumor cerebri). 3. 15 mm right maxillary sinus mucous retention cyst. 4. Mild right sphenoid sinus mucosal thickening. Electronically Signed   By: Rockey Childs D.O.   On: 02/07/2023 19:17    ASSESSMENT: Iron  deficiency anemia.  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  Although patient's hemoglobin is within normal limits, her iron  stores are mildly reduced and she is symptomatic.  Will proceed with 200 mg IV Venofer  today.  Return to clinic next week for 2 additional treatments.  Patient will then return to clinic in 4 months with repeat laboratory, further evaluation, and continuation of treatment if needed.  Rheumatoid arthritis: Continued workup to rheumatology. Depression/anxiety: Continue follow-up with primary care as scheduled.  I spent a total of 30 minutes reviewing chart data, face-to-face evaluation with the patient, counseling and coordination of care as detailed above.   Patient expressed understanding and was in agreement with this plan. She also understands that She can call clinic at any time with any questions, concerns, or complaints.     Evalene JINNY Reusing, MD   02/24/2023 10:03 AM

## 2023-02-24 NOTE — Progress Notes (Signed)
 Complains of tiredness and weakness.

## 2023-02-24 NOTE — Patient Instructions (Signed)
 Iron Sucrose Injection What is this medication? IRON SUCROSE (EYE ern SOO krose) treats low levels of iron (iron deficiency anemia) in people with kidney disease. Iron is a mineral that plays an important role in making red blood cells, which carry oxygen from your lungs to the rest of your body. This medicine may be used for other purposes; ask your health care provider or pharmacist if you have questions. COMMON BRAND NAME(S): Venofer What should I tell my care team before I take this medication? They need to know if you have any of these conditions: Anemia not caused by low iron levels Heart disease High levels of iron in the blood Kidney disease Liver disease An unusual or allergic reaction to iron, other medications, foods, dyes, or preservatives Pregnant or trying to get pregnant Breastfeeding How should I use this medication? This medication is for infusion into a vein. It is given in a hospital or clinic setting. Talk to your care team about the use of this medication in children. While this medication may be prescribed for children as young as 2 years for selected conditions, precautions do apply. Overdosage: If you think you have taken too much of this medicine contact a poison control center or emergency room at once. NOTE: This medicine is only for you. Do not share this medicine with others. What if I miss a dose? Keep appointments for follow-up doses. It is important not to miss your dose. Call your care team if you are unable to keep an appointment. What may interact with this medication? Do not take this medication with any of the following: Deferoxamine Dimercaprol Other iron products This medication may also interact with the following: Chloramphenicol Deferasirox This list may not describe all possible interactions. Give your health care provider a list of all the medicines, herbs, non-prescription drugs, or dietary supplements you use. Also tell them if you smoke,  drink alcohol, or use illegal drugs. Some items may interact with your medicine. What should I watch for while using this medication? Visit your care team regularly. Tell your care team if your symptoms do not start to get better or if they get worse. You may need blood work done while you are taking this medication. You may need to follow a special diet. Talk to your care team. Foods that contain iron include: whole grains/cereals, dried fruits, beans, or peas, leafy green vegetables, and organ meats (liver, kidney). What side effects may I notice from receiving this medication? Side effects that you should report to your care team as soon as possible: Allergic reactions--skin rash, itching, hives, swelling of the face, lips, tongue, or throat Low blood pressure--dizziness, feeling faint or lightheaded, blurry vision Shortness of breath Side effects that usually do not require medical attention (report to your care team if they continue or are bothersome): Flushing Headache Joint pain Muscle pain Nausea Pain, redness, or irritation at injection site This list may not describe all possible side effects. Call your doctor for medical advice about side effects. You may report side effects to FDA at 1-800-FDA-1088. Where should I keep my medication? This medication is given in a hospital or clinic. It will not be stored at home. NOTE: This sheet is a summary. It may not cover all possible information. If you have questions about this medicine, talk to your doctor, pharmacist, or health care provider.  2024 Elsevier/Gold Standard (2022-07-09 00:00:00)

## 2023-02-28 ENCOUNTER — Inpatient Hospital Stay: Payer: Medicare Other

## 2023-02-28 VITALS — BP 119/63 | HR 79 | Temp 96.9°F | Resp 18

## 2023-02-28 DIAGNOSIS — D509 Iron deficiency anemia, unspecified: Secondary | ICD-10-CM | POA: Diagnosis not present

## 2023-02-28 DIAGNOSIS — D508 Other iron deficiency anemias: Secondary | ICD-10-CM

## 2023-02-28 MED ORDER — IRON SUCROSE 20 MG/ML IV SOLN
200.0000 mg | Freq: Once | INTRAVENOUS | Status: AC
  Administered 2023-02-28: 200 mg via INTRAVENOUS

## 2023-02-28 NOTE — Patient Instructions (Signed)
 Iron Sucrose Injection What is this medication? IRON SUCROSE (EYE ern SOO krose) treats low levels of iron (iron deficiency anemia) in people with kidney disease. Iron is a mineral that plays an important role in making red blood cells, which carry oxygen from your lungs to the rest of your body. This medicine may be used for other purposes; ask your health care provider or pharmacist if you have questions. COMMON BRAND NAME(S): Venofer What should I tell my care team before I take this medication? They need to know if you have any of these conditions: Anemia not caused by low iron levels Heart disease High levels of iron in the blood Kidney disease Liver disease An unusual or allergic reaction to iron, other medications, foods, dyes, or preservatives Pregnant or trying to get pregnant Breastfeeding How should I use this medication? This medication is for infusion into a vein. It is given in a hospital or clinic setting. Talk to your care team about the use of this medication in children. While this medication may be prescribed for children as young as 2 years for selected conditions, precautions do apply. Overdosage: If you think you have taken too much of this medicine contact a poison control center or emergency room at once. NOTE: This medicine is only for you. Do not share this medicine with others. What if I miss a dose? Keep appointments for follow-up doses. It is important not to miss your dose. Call your care team if you are unable to keep an appointment. What may interact with this medication? Do not take this medication with any of the following: Deferoxamine Dimercaprol Other iron products This medication may also interact with the following: Chloramphenicol Deferasirox This list may not describe all possible interactions. Give your health care provider a list of all the medicines, herbs, non-prescription drugs, or dietary supplements you use. Also tell them if you smoke,  drink alcohol, or use illegal drugs. Some items may interact with your medicine. What should I watch for while using this medication? Visit your care team regularly. Tell your care team if your symptoms do not start to get better or if they get worse. You may need blood work done while you are taking this medication. You may need to follow a special diet. Talk to your care team. Foods that contain iron include: whole grains/cereals, dried fruits, beans, or peas, leafy green vegetables, and organ meats (liver, kidney). What side effects may I notice from receiving this medication? Side effects that you should report to your care team as soon as possible: Allergic reactions--skin rash, itching, hives, swelling of the face, lips, tongue, or throat Low blood pressure--dizziness, feeling faint or lightheaded, blurry vision Shortness of breath Side effects that usually do not require medical attention (report to your care team if they continue or are bothersome): Flushing Headache Joint pain Muscle pain Nausea Pain, redness, or irritation at injection site This list may not describe all possible side effects. Call your doctor for medical advice about side effects. You may report side effects to FDA at 1-800-FDA-1088. Where should I keep my medication? This medication is given in a hospital or clinic. It will not be stored at home. NOTE: This sheet is a summary. It may not cover all possible information. If you have questions about this medicine, talk to your doctor, pharmacist, or health care provider.  2024 Elsevier/Gold Standard (2022-07-09 00:00:00)

## 2023-03-03 ENCOUNTER — Encounter: Payer: Self-pay | Admitting: Oncology

## 2023-03-03 ENCOUNTER — Inpatient Hospital Stay: Payer: Medicare Other

## 2023-03-03 ENCOUNTER — Other Ambulatory Visit: Payer: Self-pay | Admitting: Family Medicine

## 2023-03-03 VITALS — BP 119/60 | HR 70 | Temp 97.2°F | Resp 18

## 2023-03-03 DIAGNOSIS — Z1231 Encounter for screening mammogram for malignant neoplasm of breast: Secondary | ICD-10-CM

## 2023-03-03 DIAGNOSIS — D508 Other iron deficiency anemias: Secondary | ICD-10-CM

## 2023-03-03 DIAGNOSIS — D509 Iron deficiency anemia, unspecified: Secondary | ICD-10-CM | POA: Diagnosis not present

## 2023-03-03 MED ORDER — SODIUM CHLORIDE 0.9% FLUSH
10.0000 mL | Freq: Once | INTRAVENOUS | Status: AC | PRN
  Administered 2023-03-03: 10 mL
  Filled 2023-03-03: qty 10

## 2023-03-03 MED ORDER — IRON SUCROSE 20 MG/ML IV SOLN
200.0000 mg | Freq: Once | INTRAVENOUS | Status: AC
  Administered 2023-03-03: 200 mg via INTRAVENOUS
  Filled 2023-03-03: qty 10

## 2023-03-03 NOTE — Progress Notes (Signed)
Pt has been educated and understands. Pt declined to stay 30 mins after iron infusion. VSS. Patient stated she was fine the first two times she stayed. No issues after going home.

## 2023-03-05 ENCOUNTER — Other Ambulatory Visit: Payer: Self-pay | Admitting: Psychiatry

## 2023-03-05 DIAGNOSIS — F410 Panic disorder [episodic paroxysmal anxiety] without agoraphobia: Secondary | ICD-10-CM

## 2023-03-05 DIAGNOSIS — F4310 Post-traumatic stress disorder, unspecified: Secondary | ICD-10-CM

## 2023-03-10 ENCOUNTER — Telehealth: Payer: Self-pay

## 2023-03-10 NOTE — Telephone Encounter (Signed)
received fax from [employer] that a prior auth was needed for the zolpidem 12.5mg 

## 2023-03-10 NOTE — Telephone Encounter (Signed)
prior Berkley Harvey was submitted and received back as approved until 02-15-24

## 2023-03-16 NOTE — H&P (Signed)
[name] is a 48 y.o. female presenting with Pre Op Consulting (Pre op sign consents)  on 03/16/2023   History of Present Illness: Pelvic pain, possible hydrosalpinx vs endometriosis on ultrasound.  - SVD x 2  - S/p hysterectomy with bilateral salpingectomy by me 09/2015 followed by unintentional weight loss of 23 pounds in the 3 months postop.   - Hx of gastric bypass, she has lost almost 200# and has kept it off   Planned surgery is: RA dx laparoscopy with bilateral oophorectomy possible excision of endometriosis and lysis of adhesions and removal of pelvic mass.     She is aware that I would recommend her continuing her HRT until at least 48 yo after surgical menopause.     Past Medical History:  has a past medical history of Abnormal ECG, Anemia, Anxiety, Arthritis (1999), B12 deficiency, Cardiomyopathy, secondary (CMS/HHS-HCC), Chronic abdominal pain, unspecified, DDD (degenerative disc disease), Depression, Diabetes mellitus type 2, uncomplicated (CMS/HHS-HCC), Endometriosis of uterus, Fibroid, GERD (gastroesophageal reflux disease), History of blood transfusion, History of headache (2009), History of stroke (12/24), Iron deficiency, Low back pain (1999), Obesity, Osteoporosis (2023), Ovarian cyst (2009), Pelvic pain in female, PTSD (post-traumatic stress disorder), Rh negative status during pregnancy (HHS-HCC), Seizures (CMS/HHS-HCC), and Small bowel obstruction (CMS/HHS-HCC).  Past Surgical History:  has a past surgical history that includes Cholecystectomy (2013); Roux-en-y procedure (2009); Surgery for a bowel obstruction (2011); egd (05/08/2013); laparoscopic ovarian cystectomy and hysteroscopic polypectomy (2015); Dilation and curettage of uterus (06/21/2013); egd (N/A, 05/06/2014); Hysterectomy; Colonoscopy (01/24/2018); egd (01/24/2018); Bariatric Surgery (2009); and Colon surgery. Family History: family history includes Anxiety in her mother; Arthritis in her brother, father,  maternal grandmother, mother, paternal grandmother, and sister; Asthma in her maternal grandmother and sister; Breast cancer in her paternal grandmother and another family member; Colon cancer in her father; Colon polyps in her mother, sister, and another family member; Coronary Artery Disease (Blocked arteries around heart) in her father; Depression in her father, maternal grandmother, mother, paternal grandmother, sister, and sister; Heart disease in her paternal grandmother; High blood pressure (Hypertension) in her father and paternal grandmother; Lung cancer in her maternal grandmother and another family member; Myocardial Infarction (Heart attack) in her father, mother, and paternal grandmother; Ovarian cancer (age of onset: 57) in her maternal grandmother; Pulmonary embolism in her mother; Rheum arthritis in her mother; Skin cancer in her mother; Stroke in her father. Social History:  reports that she has never smoked. She has never used smokeless tobacco. She reports that she does not drink alcohol and does not use drugs. OB/GYN History:  OB History       Gravida 2   Para 2   Term 2   Preterm 0   AB     Living 2       SAB     IAB     Ectopic     Molar     Multiple 0   Live Births 2        Allergies: is allergic to amoxicillin, penicillins, morphine, and sulfa (sulfonamide antibiotics). Medications:  Current Medications   Current Outpatient Medications:    acetaminophen (TYLENOL) 325 MG tablet, Take 650 mg by mouth every 4 (four) hours as needed, Disp: , Rfl:    ALPRAZolam (XANAX) 0.25 MG tablet, TAKE 1 TABLET BY MOUTH EVERY DAY AS NEEDED FOR UP TO 3 DAYS FOR ANXIETY, Disp: , Rfl:    buPROPion (WELLBUTRIN XL) 150 MG XL tablet, , Disp: , Rfl:  cyanocobalamin (VITAMIN B12) 1,000 mcg/mL injection, Inject 1 mL into muscle once a week for 4 weeks then once a month for 4 months., Disp: 8 mL, Rfl: 0   diphenoxylate-atropine (LOMOTIL) 2.5-0.025 mg tablet,  TAKE 1 TO 2 TABLETS BY MOUTH EVERY 6 HOURS AS NEEDED, Disp: , Rfl:    estradiol (DOTTI) patch 0.1 mg/24 hr, Place 1 patch onto the skin twice a week, Disp: 24 patch, Rfl: 3   fluconazole (DIFLUCAN) 150 MG tablet, , Disp: , Rfl:    HYDROcodone-acetaminophen (NORCO) 7.5-325 mg tablet, Take 1 tablet by mouth every 6 (six) hours as needed for Pain, Disp: 120 tablet, Rfl: 0   hydrOXYzine (ATARAX) 25 MG tablet, TAKE 1/2 TO 1 TABLET(12.5 TO 25 MG) BY MOUTH TWICE DAILY AS NEEDED FOR ANXIETY, Disp: , Rfl:    hydrOXYzine (ATARAX) 50 MG tablet, , Disp: , Rfl:    lamoTRIgine (LAMICTAL) 200 MG tablet, , Disp: , Rfl:    pantoprazole (PROTONIX) 40 MG DR tablet, Take 1 tablet by mouth 2 (two) times daily, Disp: , Rfl:    RAYALDEE 30 mcg Cs24, Take 1 capsule by mouth once daily, Disp: , Rfl:    SUMAtriptan (IMITREX) 100 MG tablet, TAKE 1 TABLET BY MOUTH AS DIRECTED FOR MIGRAINE. MAY TAKE A SECOND DOSE AFTER 2 HOURS AS NEEDED., Disp: 10 tablet, Rfl: 11   topiramate (TOPAMAX) 50 MG tablet, Take 2 tablets (100 mg total) by mouth 2 (two) times daily for 360 days, Disp: 360 tablet, Rfl: 3   VIIBRYD 20 mg tablet, Take 40 mg by mouth once daily, Disp: , Rfl:    zolpidem (AMBIEN CR) 12.5 MG CR tablet, Take 12.5 mg by mouth at bedtime, Disp: , Rfl:    ergocalciferol, vitamin D2, 1,250 mcg (50,000 unit) capsule, Take 1 capsule (50,000 Units total) by mouth once a week for 8 doses (Patient taking differently: Take 50,000 Units by mouth once daily), Disp: 8 capsule, Rfl: 0   ergocalciferol, vitamin D2, 1,250 mcg (50,000 unit) capsule, Take 1 capsule (50,000 Units total) by mouth once a week for 8 doses (Patient not taking: Reported on 12/15/2022), Disp: 8 capsule, Rfl: 0   lamoTRIgine (LAMICTAL) 100 MG tablet, 200 mg once daily (Patient not taking: Reported on 03/16/2023), Disp: , Rfl:    naloxone (NARCAN) 4 mg/actuation nasal spray, Place 1 spray (4 mg total) into one nostril once as needed (if not breathing.) for up to 1 dose,  Disp: 1 each, Rfl: 0    Review of Systems: No SOB, no palpitations or chest pain, no new lower extremity edema, no nausea or vomiting or bowel or bladder complaints. See HPI for gyn specific ROS.    Exam:   BP 107/71   Pulse 84   Ht 170.2 cm (5\' 7" )   Wt 67.4 kg (148 lb 9.6 oz)   LMP  (LMP Unknown) Comment: bleeding x 6 months  BMI 23.27 kg/m    General: Patient is well-groomed, well-nourished, appears stated age in no acute distress   HEENT: head is atraumatic and normocephalic, trachea is midline, neck is supple with no palpable nodules   CV: Regular rhythm and normal heart rate, no murmur   Pulm: Clear to auscultation throughout lung fields with no wheezing, crackles, or rhonchi. No increased work of breathing     Impression:   The primary encounter diagnosis was Pelvic pain in female. A diagnosis of Pelvic mass was also pertinent to this visit.   Plan:   1. preop -Patient  returns for a preoperative discussion regarding her plans to proceed with surgical treatment of her pelvic pain/ hx of endometriosis RA dx laparoscopy with bilateral oophorectomy possible excision of endometriosis and lysis of adhesions and removal of pelvic massprocedure.  The patient and I discussed the technical aspects of the procedure including the potential for risks and complications.  These include but are not limited to the risk of infection requiring post-operative antibiotics or further procedures.  We talked about the risk of injury to adjacent organs including bladder, bowel, ureter, blood vessels or nerves.  We talked about the need to convert to an open incision.  We talked about the possible need for blood transfusion.  We talked about postop complications such as thromboembolic or cardiopulmonary complications.  All of her questions were answered.  Her preoperative exam was completed and the appropriate consents were signed. She is scheduled to undergo this procedure in the near future.

## 2023-03-19 ENCOUNTER — Encounter: Payer: Self-pay | Admitting: Oncology

## 2023-03-24 ENCOUNTER — Encounter: Payer: Self-pay | Admitting: Psychiatry

## 2023-03-24 ENCOUNTER — Encounter
Admission: RE | Admit: 2023-03-24 | Discharge: 2023-03-24 | Disposition: A | Discharge status: Home / Self Care | Payer: Medicare Other | Source: Ambulatory Visit | Attending: Obstetrics and Gynecology | Admitting: Obstetrics and Gynecology

## 2023-03-24 ENCOUNTER — Other Ambulatory Visit: Payer: Self-pay

## 2023-03-24 ENCOUNTER — Ambulatory Visit (INDEPENDENT_AMBULATORY_CARE_PROVIDER_SITE_OTHER): Payer: Medicare Other | Admitting: Psychiatry

## 2023-03-24 VITALS — BP 110/72 | HR 106 | Temp 97.9°F | Ht 67.0 in | Wt 141.4 lb

## 2023-03-24 DIAGNOSIS — F410 Panic disorder [episodic paroxysmal anxiety] without agoraphobia: Secondary | ICD-10-CM | POA: Diagnosis not present

## 2023-03-24 DIAGNOSIS — F3342 Major depressive disorder, recurrent, in full remission: Secondary | ICD-10-CM | POA: Diagnosis not present

## 2023-03-24 DIAGNOSIS — F4310 Post-traumatic stress disorder, unspecified: Secondary | ICD-10-CM | POA: Diagnosis not present

## 2023-03-24 DIAGNOSIS — F5101 Primary insomnia: Secondary | ICD-10-CM

## 2023-03-24 DIAGNOSIS — Z01812 Encounter for preprocedural laboratory examination: Secondary | ICD-10-CM

## 2023-03-24 HISTORY — DX: Personal history of urinary calculi: Z87.442

## 2023-03-24 HISTORY — DX: Intra-abdominal and pelvic swelling, mass and lump, unspecified site: R19.00

## 2023-03-24 HISTORY — DX: Family history of other specified conditions: Z84.89

## 2023-03-24 NOTE — Patient Instructions (Signed)
Mindfulness-Based Stress Reduction Mindfulness-based stress reduction (MBSR) is a program that helps people learn to practice mindfulness. Mindfulness is the practice of consciously paying attention to the present moment. MBSR focuses on developing self-awareness, which lets you respond to life stress without judgment or negative feelings. It can be learned and practiced through techniques such as education, breathing exercises, meditation, and yoga. MBSR includes several mindfulness techniques in one program. MBSR works best when you understand the treatment, are willing to try new things, and can commit to spending time practicing what you learn. MBSR training may include learning about: How your feelings, thoughts, and reactions affect your body. New ways to respond to things that cause negative thoughts to start (triggers). How to notice your thoughts and let go of them. Practicing awareness of everyday things that you normally do without thinking. The techniques and goals of different types of meditation. What are the benefits of MBSR? MBSR can have many benefits, which include helping you to: Develop self-awareness. This means knowing and understanding yourself. Learn skills and attitudes that help you to take part in your own health care. Learn new ways to care for yourself. Be more accepting about how things are, and let things go. Be less judgmental and approach things with an open mind. Be patient with yourself and trust yourself more. MBSR has also been shown to: Reduce negative emotions, such as sadness, overwhelm, and worry. Improve memory and focus. Change how you sense and react to pain. Boost your body's ability to fight infections. Help you connect better with other people. Improve your sense of well-being. How to practice mindfulness To do a basic awareness exercise: Find a comfortable place to sit. Pay attention to the present moment. Notice your thoughts, feelings, and  surroundings just as they are. Avoid judging yourself, your feelings, or your surroundings. Make note of any judgment that comes up and let it go. Your mind may wander, and that is okay. Make note of when your thoughts drift, and return your attention to the present moment. To do basic mindfulness meditation: Find a comfortable place to sit. This may include a stable chair or a firm floor cushion. Sit upright with your back straight. Let your arms fall next to your sides, with your hands resting on your legs. If you are sitting in a chair, rest your feet flat on the floor. If you are sitting on a cushion, cross your legs in front of you. Keep your head in a neutral position with your chin dropped slightly. Relax your jaw and rest the tip of your tongue on the roof of your mouth. Drop your gaze to the floor or close your eyes. Breathe normally and pay attention to your breath. Feel the air moving in and out of your nose. Feel your belly expanding and relaxing with each breath. Your mind may wander, and that is okay. Make note of when your thoughts drift, and return your attention to your breath. Avoid judging yourself, your feelings, or your surroundings. Make note of any judgment or feelings that come up, let them go, and bring your attention back to your breath. When you are ready, lift your gaze or open your eyes. Pay attention to how your body feels after the meditation. Follow these instructions at home:  Find a local in-person or online MBSR program. Set aside some time regularly for mindfulness practice. Practice every day if you can. Even 10 minutes of practice is helpful. Find a mindfulness practice that works best for  you. This may include one or more of the following: Meditation. This involves focusing your mind on a certain thought or activity. Breathing awareness exercises. These help you to stay present by focusing on your breath. Body scan. For this practice, you lie down and pay  attention to each part of your body from head to toe. You can identify tension and soreness and consciously relax parts of your body. Yoga. Yoga involves stretching and breathing, and it can improve your ability to move and be flexible. It can also help you to test your body's limits, which can help you release stress. Mindful eating. This way of eating involves focusing on the taste, texture, color, and smell of each bite of food. This slows down eating and helps you feel full sooner. For this reason, it can be an important part of a weight loss plan. Find a podcast or recording that provides guidance for breathing awareness, body scan, or meditation exercises. You can listen to these any time when you have a free moment to rest without distractions. Follow your treatment plan as told by your health care provider. This may include taking regular medicines and making changes to your diet or lifestyle as recommended. Where to find more information You can find more information about MBSR from: Your health care provider. Community-based meditation centers or programs. Programs offered near you. Summary Mindfulness-based stress reduction (MBSR) is a program that teaches you how to consciously pay attention to the present moment. It is used to help you deal better with daily stress, feelings, and pain. MBSR focuses on developing self-awareness, which allows you to respond to life stress without judgment or negative feelings. MBSR programs may involve learning different mindfulness practices, such as breathing exercises, meditation, yoga, body scan, or mindful eating. Find a mindfulness practice that works best for you, and set aside time for it on a regular basis. This information is not intended to replace advice given to you by your health care provider. Make sure you discuss any questions you have with your health care provider. Document Revised: 09/11/2020 Document Reviewed: 09/11/2020 Elsevier  Patient Education  2024 [employer].

## 2023-03-24 NOTE — Progress Notes (Signed)
 [facility] MD OP Progress Note  03/24/2023 8:52 AM [name]  MRN:  [id number]  Chief Complaint:  Chief Complaint  Patient presents with   Follow-up   Anxiety   Depression   Medication Refill   HPI: [name] is a 48 year old Caucasian female divorced, lives in [place], has a history of MDD, panic disorder, PTSD, primary insomnia, history of seizure disorder, chronic pain, history of gastric bypass (2009), small bowel obstruction was evaluated in office today.  The patient with history of endometriosis and partial hysterectomy presents with anxiety related to an upcoming surgery for a pelvic mass.  She is experiencing significant anxiety and stress due to an upcoming surgery scheduled for February 14th to remove a pelvic mass attached to her ovaries. Her anxiety is heightened by her history of endometriosis and previous surgeries, which have resulted in significant scar tissue. She is concerned about potential complications during the surgery due to her past surgical experiences.  She is having difficulty focusing on daily activities and is preoccupied with the upcoming surgery. She is managing responsibilities such as caring for her unwell parents and coordinating her daughter's schedule around her surgery. She wants to maintain normalcy for her daughter, who will soon be 15 years old.  She is currently taking Lamictal  225 mg, Wellbutrin  150 mg, Viibryd , Ambien , and hydroxyzine  as needed, several times a week. She reports increased use of hydroxyzine  due to heightened anxiety. Denies thoughts of self-harm are present, and her sleep is okay, though her appetite has decreased slightly due to nervousness.  She has not established care with a therapist yet and declines referral today.  Visit Diagnosis:    ICD-10-CM   1. MDD (major depressive disorder), recurrent, in full remission (HCC)  F33.42     2. Panic disorder  F41.0     3. PTSD (post-traumatic stress disorder)  F43.10      4. Primary insomnia  F51.01       Past Psychiatric History: I have reviewed past psychiatric history from progress note on 05/09/2017.  Past Medical History:  Past Medical History:  Diagnosis Date   Abnormal ECG    Anemia    Anxiety    Arthritis    Asthma    B12 deficiency    Cardiomyopathy (HCC)    Chronic abdominal pain    Complication of anesthesia    difficulty to get sedated during endoscopy   COVID-19 01/18/2019   DDD (degenerative disc disease), lumbar    Depression    Diabetes mellitus without complication (HCC)    ONLY gestational diabetes, does not have chronic diabetes   Dysrhythmia    Family history of adverse reaction to anesthesia    brother woke during surgery   GERD (gastroesophageal reflux disease)    History of kidney stones    Iron  deficiency    Migraine headache    Neuropathy    Obesity    gastric bypass 2009   Ovarian cyst    Pelvic mass    Pneumonia    within past five years   PTSD (post-traumatic stress disorder)    PTSD (post-traumatic stress disorder)    Rh negative status during pregnancy    Seizures (HCC)    post-gestational   Small bowel obstruction (HCC)    Vitamin D  deficiency     Past Surgical History:  Procedure Laterality Date   ABDOMINAL HYSTERECTOMY     BILATERAL SALPINGECTOMY Bilateral 09/29/2015   Procedure: BILATERAL SALPINGECTOMY;  Surgeon: Heather Penton, MD;  Location: [facility];  Service: Gynecology;  Laterality: Bilateral;   bowel obstruction  2011   CHOLECYSTECTOMY     COLONOSCOPY WITH PROPOFOL  N/A 01/24/2018   Procedure: COLONOSCOPY WITH PROPOFOL ;  Surgeon: Toledo, Ladell POUR, MD;  Location: [facility];  Service: Gastroenterology;  Laterality: N/A;   DILATION AND CURETTAGE OF UTERUS     ESOPHAGOGASTRODUODENOSCOPY     ESOPHAGOGASTRODUODENOSCOPY (EGD) WITH PROPOFOL  N/A 01/24/2018   Procedure: ESOPHAGOGASTRODUODENOSCOPY (EGD) WITH PROPOFOL ;  Surgeon: Toledo, Ladell POUR, MD;  Location: [facility];  Service:  Gastroenterology;  Laterality: N/A;   GASTRIC BYPASS  2010   gi bleed  2009   Surgery-was in ICU for three weeks   HERNIA REPAIR     OVARIAN CYST REMOVAL Left 09/29/2015   Procedure: OVARIAN CYSTECTOMY;  Surgeon: Heather Penton, MD;  Location: [facility];  Service: Gynecology;  Laterality: Left;   ROUX-EN-Y PROCEDURE     VAGINAL HYSTERECTOMY N/A 09/29/2015   Procedure: HYSTERECTOMY VAGINAL;  Surgeon: Heather Penton, MD;  Location: [facility];  Service: Gynecology;  Laterality: N/A;    Family Psychiatric History: I have reviewed family psychiatric history from progress note on 05/09/2017.  Family History:  Family History  Problem Relation Age of Onset   Arthritis/Rheumatoid Mother    Heart block Mother    Clotting disorder Mother    Osteoporosis Mother    Heart attack Mother    Anxiety disorder Mother    Depression Mother    Colon polyps Mother    Stomach cancer Father 31   Heart disease Father    Heart attack Father    Depression Sister    Anxiety disorder Sister    Colon polyps Sister    Colon cancer Neg Hx    Esophageal cancer Neg Hx    Pancreatic cancer Neg Hx     Social History: I have reviewed social history from progress note on 05/09/2017. Social History   Socioeconomic History   Marital status: Single    Spouse name: Not on file   Number of children: 2   Years of education: Not on file   Highest education level: Not on file  Occupational History   Occupation: disabled  Tobacco Use   Smoking status: Never   Smokeless tobacco: Never  Vaping Use   Vaping status: Never Used  Substance and Sexual Activity   Alcohol use: No    Alcohol/week: 0.0 standard drinks of alcohol   Drug use: No   Sexual activity: Not on file  Other Topics Concern   Not on file  Social History Narrative   Lives with mother, daughter, son, and fiance. Pets, dogs, in home.   Social Drivers of Corporate Investment Banker Strain: Not on file  Food Insecurity: Not on file   Transportation Needs: Not on file  Physical Activity: Not on file  Stress: Not on file  Social Connections: Not on file    Allergies:  Allergies  Allergen Reactions   Amoxicillin Anaphylaxis, Hives, Shortness Of Breath, Swelling and Rash    Has patient had a PCN reaction causing immediate rash, facial/tongue/throat swelling, SOB or lightheadedness with hypotension: Yes Has patient had a PCN reaction causing severe rash involving mucus membranes or skin necrosis: Yes Has patient had a PCN reaction that required hospitalization No Has patient had a PCN reaction occurring within the last 10 years: No If all of the above answers are NO, then may proceed with Cephalosporin use. Other reaction(s): ANAPHYLAXIS Other reaction(s): ANAPHYLAX   Penicillins Anaphylaxis, Swelling,  Rash, Shortness Of Breath and Hives    Has patient had a PCN reaction causing immediate rash, facial/tongue/throat swelling, SOB or lightheadedness with hypotension: Yes Has patient had a PCN reaction causing severe rash involving mucus membranes or skin necrosis: Yes Has patient had a PCN reaction that required hospitalization No Has patient had a PCN reaction occurring within the last 10 years: No If all of the above answers are NO, then may proceed with Cephalosporin use.  Other reaction(s): ANAPHYLAXIS Other reaction(s): ANAPHYLAXIS Has patient had a PCN reaction causing immediate rash, facial/tongue/throat swelling, SOB or lightheadedness with hypotension: Yes Has patient had a PCN reaction causing severe rash involving mucus membranes or skin necrosis: Yes Has patient had a PCN reaction that required hospitalization No Has patient had a PCN reaction occurring within the last 10 years: No If all of the above answers are NO, then may proceed with Cephalosporin use. Other reaction(s): ANAPHYLAXIS Other reaction(s): ANAPHYLAX Has patient had a PCN reaction causing immediate rash, facial/tongue/throat swelling,  SOB or lightheadedness with hypotension: Yes Has patient had a PCN reaction causing severe rash involving mucus membranes or skin necrosis: Yes Has patient had a PCN reaction that required hospitalization No Has patient had a PCN reaction occurring within the last 10 years: No If all of the above answers are NO, then may proceed with Cephalosporin use. Other reaction(s): ANAPHYLAXIS Other reaction(s): ANAPHYLAXIS    Morphine Other (See Comments)    Muscle spasms   Sulfa Antibiotics Nausea Only    Other reaction(s): VOMITING Other reaction(s): VOMITING Other reaction(s): VOMITING    Metabolic Disorder Labs: Lab Results  Component Value Date   HGBA1C 5.5 12/11/2019   MPG 111 12/11/2019   Lab Results  Component Value Date   PROLACTIN 12.1 12/11/2019   Lab Results  Component Value Date   CHOL 162 12/11/2019   TRIG 51 12/11/2019   HDL 87 12/11/2019   CHOLHDL 1.9 12/11/2019   VLDL 10 12/11/2019   LDLCALC 65 12/11/2019   Lab Results  Component Value Date   TSH 1.886 12/11/2019   TSH  05/13/2009    3.163 (NOTE) Please note change in reference ranges for ages 21W to 67Y. Test methodology is 3rd generation TSH    Therapeutic Level Labs: No results found for: LITHIUM No results found for: VALPROATE No results found for: CBMZ  Current Medications: Current Outpatient Medications  Medication Sig Dispense Refill   acetaminophen  (TYLENOL ) 325 MG tablet Take 650 mg by mouth every 4 (four) hours as needed for moderate pain.      ALPRAZolam  (XANAX ) 0.25 MG tablet TAKE 1 TABLET BY MOUTH EVERY DAY AS NEEDED FOR UP TO 3 DAYS FOR ANXIETY     buPROPion  (WELLBUTRIN  XL) 150 MG 24 hr tablet Take 1 tablet (150 mg total) by mouth daily. 90 tablet 1   Calcifediol  ER 30 MCG CPCR Take 1 capsule (30 mcg total) by mouth daily. 30 capsule 3   cyanocobalamin  (VITAMIN B12) 1000 MCG/ML injection Please administer 1000 mcg injection every 3 days 10 mL 4   diphenoxylate -atropine  (LOMOTIL )  2.5-0.025 MG tablet Take 1 tablet by mouth as needed. 60 tablet 0   estradiol (VIVELLE-DOT) 0.1 MG/24HR patch Place 1 patch onto the skin 2 (two) times a week.     HYDROcodone-acetaminophen  (NORCO) 7.5-325 MG tablet Take 1 tablet by mouth every 6 (six) hours as needed for moderate pain.     hydrOXYzine  (ATARAX ) 50 MG tablet Take 1 tablet (50 mg total) by mouth 2 (  two) times daily as needed. 60 tablet 2   lamoTRIgine  (LAMICTAL ) 200 MG tablet TAKE 1/2 TABLET(100 MG) BY MOUTH TWICE DAILY 90 tablet 1   Multiple Vitamins-Minerals (HM MULTIVITAMIN ADULT GUMMY) CHEW Chew 2 tablets by mouth daily.     NONFORMULARY OR COMPOUNDED ITEM calcidiol formulation with 40 mcg vitamin D3 daily 30 each 3   pantoprazole  (PROTONIX ) 40 MG tablet Take 1 tablet (40 mg total) by mouth 2 (two) times daily. 60 tablet 3   SUMAtriptan  (IMITREX ) 100 MG tablet Take 100 mg by mouth every 2 (two) hours as needed for migraine or headache.      topiramate  (TOPAMAX ) 50 MG tablet Take 100 mg by mouth 2 (two) times daily.     Vilazodone  HCl (VIIBRYD ) 40 MG TABS TAKE 1 TABLET BY MOUTH DAILY 90 tablet 1   zolpidem  (AMBIEN  CR) 12.5 MG CR tablet Take 1 tablet (12.5 mg total) by mouth at bedtime. 30 tablet 5   No current facility-administered medications for this visit.     Musculoskeletal: Strength & Muscle Tone: within normal limits Gait & Station: normal Patient leans: N/A  Psychiatric Specialty Exam: Review of Systems  Psychiatric/Behavioral:  The patient is nervous/anxious.     Blood pressure 110/72, pulse (!) 106, temperature 97.9 F (36.6 C), temperature source Temporal, height 5' 7 (1.702 m), weight 141 lb 6.4 oz (64.1 kg), SpO2 96%.Body mass index is 22.15 kg/m.  General Appearance: Fairly Groomed  Eye Contact:  Good  Speech:  Clear and Coherent  Volume:  Normal  Mood:  Anxious  Affect:  Congruent  Thought Process:  Goal Directed and Descriptions of Associations: Intact  Orientation:  Full (Time, Place, and  Person)  Thought Content: Rumination   Suicidal Thoughts:  No  Homicidal Thoughts:  No  Memory:  Immediate;   Fair Recent;   Fair Remote;   Fair  Judgement:  Fair  Insight:  Fair  Psychomotor Activity:  Normal  Concentration:  Concentration: Fair and Attention Span: Fair  Recall:  [employer] of Knowledge: Fair  Language: Fair  Akathisia:  No  Handed:  Right  AIMS (if indicated): not done  Assets:  Desire for Improvement Housing Social Support  ADL's:  Intact  Cognition: WNL  Sleep:  Fair   Screenings: AIMS    Flowsheet Row Video Visit from 06/16/2021 in [facility]  AIMS Total Score 0      GAD-7    Flowsheet Row Office Visit from 03/24/2023 in [facility] Office Visit from 07/05/2022 in [facility] Office Visit from 05/05/2022 in [facility] Office Visit from 03/19/2022 in [facility] Office Visit from 02/25/2022 in [facility]  Total GAD-7 Score 16 8 9 3 7       PHQ2-9    Flowsheet Row Office Visit from 03/24/2023 in [facility] Video Visit from 10/06/2022 in [facility] Office Visit from 07/05/2022 in [facility] Office Visit from 05/05/2022 in [facility] Office Visit from 03/19/2022 in [facility]  PHQ-2 Total Score 1 1 4 2 2   PHQ-9 Total Score -- -- 9 8 4       Flowsheet Row Office Visit from 03/24/2023 in [facility] Most recent reading at 03/25/2023  8:24 AM ED  from 01/15/2023 in [facility] at [facility] Most recent reading at 01/15/2023  2:36 PM ED from 01/15/2023 in  [facility] Most recent reading at 01/15/2023 11:20 AM  C-SSRS RISK CATEGORY No Risk No Risk No Risk        Assessment and Plan: MEADOW ABRAMO is a 48 year old Caucasian female who has a history of depression, anxiety, insomnia was evaluated in office today.  Patient is currently struggling with anxiety due to upcoming surgery, discussed assessment and plan.  Panic disorder/PTSD-unstable Increased anxiety and difficulty focusing due to upcoming surgery for a pelvic mass. History of endometriosis and multiple surgeries contributes to anxiety.  Experiencing situational anxiety related to health and family responsibilities. Discussed mindfulness strategies and self-care. - Encourage mindfulness strategies and breathing techniques. - Increase Hydroxyzine  use as needed for anxiety. - Continue Hydroxyzine  50 mg twice a day as needed - Continue Viibryd  40 mg daily - Consider therapy for additional support.  Patient declines. - Follow up in 8 weeks.  MDD in remission Currently denies any significant depression symptoms.  Well-managed on medication. --Continue Lamotrigine  200 mg daily in divided dosage. - Continue Wellbutrin  XL 150 mg daily-reduced dosage.  Insomnia-stable Currently reports sleep is overall good. - Continue Zolpidem  extended release 12.5 mg at bedtime.   General Health Maintenance Managing multiple responsibilities, including caring for parents and child. Support from church community but finds it difficult to ask for help. Discussed importance of self-care and prioritizing health. - Encourage acceptance of support from family and community. - Discuss importance of self-care and prioritizing health.  Follow-up - Schedule follow-up appointment in 8 weeks. - Consider video consultation for follow-up.  Collaboration of Care: Collaboration of Care: Patient refused AEB patient declines referral for therapy.  Patient/Guardian was  advised Release of Information must be obtained prior to any record release in order to collaborate their care with an outside provider. Patient/Guardian was advised if they have not already done so to contact the registration department to sign all necessary forms in order for us  to release information regarding their care.   Consent: Patient/Guardian gives verbal consent for treatment and assignment of benefits for services provided during this visit. Patient/Guardian expressed understanding and agreed to proceed.   This note was generated in part or whole with voice recognition software. Voice recognition is usually quite accurate but there are transcription errors that can and very often do occur. I apologize for any typographical errors that were not detected and corrected.    Lestat Golob, MD 03/25/2023, 8:24 AM

## 2023-03-24 NOTE — Patient Instructions (Addendum)
 Your procedure is scheduled on: 02/14.25 - Friday Report to the Registration Desk on the 1st floor of the [facility]. To find out your arrival time, please call [phone] between 1PM - 3PM on: 03/31/23 - Thursday If your arrival time is 6:00 am, do not arrive before that time as the [facility] entrance doors do not open until 6:00 am.  REMEMBER: Instructions that are not followed completely may result in serious medical risk, up to and including death; or upon the discretion of your surgeon and anesthesiologist your surgery may need to be rescheduled.  Do not eat food after midnight the night before surgery.  No gum chewing or hard candies.  You may however, drink CLEAR liquids up to 2 hours before you are scheduled to arrive for your surgery. Do not drink anything within 2 hours of your scheduled arrival time.  Clear liquids include: - water   - apple juice without pulp - gatorade (not RED colors) - black coffee or tea (Do NOT add milk or creamers to the coffee or tea) Do NOT drink anything that is not on this list.   One week prior to surgery: Stop Anti-inflammatories (NSAIDS) such as Advil , Aleve, Ibuprofen , Motrin , Naproxen, Naprosyn and Aspirin  based products such as Excedrin, Goody's Powder, BC Powder.  Stop ANY OVER THE COUNTER supplements until after surgery : Multiple Vitamins-Minerals   You may continue to take Tylenol  if needed for pain up until the day of surgery.   ON THE DAY OF SURGERY ONLY TAKE THESE MEDICATIONS WITH SIPS OF WATER :  buPROPion  (WELLBUTRIN  XL)  HYDROcodone-acetaminophen  if needed lamoTRIgine  (LAMICTAL )  pantoprazole  (PROTONIX ) topiramate  (TOPAMAX )  Vilazodone  HCl (VIIBRYD )  ALPRAZolam  (XANAX ) if needed   No Alcohol for 24 hours before or after surgery.  No Smoking including e-cigarettes for 24 hours before surgery.  No chewable tobacco products for at least 6 hours before surgery.  No nicotine patches on the day of surgery.  Do not  use any recreational drugs for at least a week (preferably 2 weeks) before your surgery.  Please be advised that the combination of cocaine and anesthesia may have negative outcomes, up to and including death. If you test positive for cocaine, your surgery will be cancelled.  On the morning of surgery brush your teeth with toothpaste and water , you may rinse your mouth with mouthwash if you wish. Do not swallow any toothpaste or mouthwash.  Use CHG Soap or wipes as directed on instruction sheet.  Do not wear jewelry, make-up, hairpins, clips or nail polish.  For welded (permanent) jewelry: bracelets, anklets, waist bands, etc.  Please have this removed prior to surgery.  If it is not removed, there is a chance that hospital personnel will need to cut it off on the day of surgery.  Do not wear lotions, powders, or perfumes.   Do not shave body hair from the neck down 48 hours before surgery.  Contact lenses, hearing aids and dentures may not be worn into surgery.  Do not bring valuables to the hospital. [facility] is not responsible for any missing/lost belongings or valuables.   Notify your doctor if there is any change in your medical condition (cold, fever, infection).  Wear comfortable clothing (specific to your surgery type) to the hospital.  After surgery, you can help prevent lung complications by doing breathing exercises.  Take deep breaths and cough every 1-2 hours. Your doctor may order a device called an Incentive Spirometer to help you take deep breaths. When  coughing or sneezing, hold a pillow firmly against your incision with both hands. This is called "splinting." Doing this helps protect your incision. It also decreases belly discomfort.  If you are being admitted to the hospital overnight, leave your suitcase in the car. After surgery it may be brought to your room.  In case of increased patient census, it may be necessary for you, the patient, to continue your  postoperative care in the Same Day Surgery department.  If you are being discharged the day of surgery, you will not be allowed to drive home. You will need a responsible individual to drive you home and stay with you for 24 hours after surgery.   If you are taking public transportation, you will need to have a responsible individual with you.  Please call the Pre-admissions Testing Dept. at [phone] if you have any questions about these instructions.  Surgery Visitation Policy:  Patients having surgery or a procedure may have two visitors.  Children under the age of 46 must have an adult with them who is not the patient.  Temporary Visitor Restrictions Due to increasing cases of flu, RSV and COVID-19: Children ages 46 and under will not be able to visit patients in [facility] hospitals under most circumstances.  Inpatient Visitation:    Visiting hours are 7 a.m. to 8 p.m. Up to four visitors are allowed at one time in a patient room. The visitors may rotate out with other people during the day.  One visitor age 46 or older may stay with the patient overnight and must be in the room by 8 p.m.    Preparing for Surgery with CHLORHEXIDINE  GLUCONATE (CHG) Soap  Chlorhexidine  Gluconate (CHG) Soap  o An antiseptic cleaner that kills germs and bonds with the skin to continue killing germs even after washing  o Used for showering the night before surgery and morning of surgery  Before surgery, you can play an important role by reducing the number of germs on your skin.  CHG (Chlorhexidine  gluconate) soap is an antiseptic cleanser which kills germs and bonds with the skin to continue killing germs even after washing.  Please do not use if you have an allergy to CHG or antibacterial soaps. If your skin becomes reddened/irritated stop using the CHG.  1. Shower the NIGHT BEFORE SURGERY and the MORNING OF SURGERY with CHG soap.  2. If you choose to wash your hair, wash your hair  first as usual with your normal shampoo.  3. After shampooing, rinse your hair and body thoroughly to remove the shampoo.  4. Use CHG as you would any other liquid soap. You can apply CHG directly to the skin and wash gently with a scrungie or a clean washcloth.  5. Apply the CHG soap to your body only from the neck down. Do not use on open wounds or open sores. Avoid contact with your eyes, ears, mouth, and genitals (private parts). Wash face and genitals (private parts) with your normal soap.  6. Wash thoroughly, paying special attention to the area where your surgery will be performed.  7. Thoroughly rinse your body with warm water .  8. Do not shower/wash with your normal soap after using and rinsing off the CHG soap.  9. Pat yourself dry with a clean towel.  10. Wear clean pajamas to bed the night before surgery.  12. Place clean sheets on your bed the night of your first shower and do not sleep with pets.  13. Shower  again with the CHG soap on the day of surgery prior to arriving at the hospital.  14. Do not apply any deodorants/lotions/powders.  15. Please wear clean clothes to the hospital.

## 2023-03-25 ENCOUNTER — Encounter
Admission: RE | Admit: 2023-03-25 | Discharge: 2023-03-25 | Disposition: A | Discharge status: Home / Self Care | Payer: Medicare Other | Source: Ambulatory Visit | Attending: Obstetrics and Gynecology | Admitting: Obstetrics and Gynecology

## 2023-03-25 DIAGNOSIS — R102 Pelvic and perineal pain unspecified side: Secondary | ICD-10-CM

## 2023-03-25 DIAGNOSIS — Z01812 Encounter for preprocedural laboratory examination: Secondary | ICD-10-CM | POA: Diagnosis present

## 2023-03-25 LAB — TYPE AND SCREEN
ABO/RH(D): A NEG
Antibody Screen: NEGATIVE

## 2023-03-25 LAB — BASIC METABOLIC PANEL
Anion gap: 9 (ref 5–15)
BUN: 12 mg/dL (ref 6–20)
CO2: 22 mmol/L (ref 22–32)
Calcium: 9 mg/dL (ref 8.9–10.3)
Chloride: 106 mmol/L (ref 98–111)
Creatinine, Ser: 0.67 mg/dL (ref 0.44–1.00)
GFR, Estimated: >60 mL/min (ref >60)
Glucose, Bld: 126 mg/dL — ABNORMAL HIGH (ref 70–99)
Potassium: 3.3 mmol/L — ABNORMAL LOW (ref 3.5–5.1)
Sodium: 137 mmol/L (ref 135–145)

## 2023-03-25 LAB — CBC
HCT: 42.2 % (ref 36.0–46.0)
Hemoglobin: 13.5 g/dL (ref 12.0–15.0)
MCH: 28.8 pg (ref 26.0–34.0)
MCHC: 32 g/dL (ref 30.0–36.0)
MCV: 90.2 fL (ref 80.0–100.0)
Platelets: 347 10*3/uL (ref 150–400)
RBC: 4.68 MIL/uL (ref 3.87–5.11)
RDW: 13.6 % (ref 11.5–15.5)
WBC: 5.2 10*3/uL (ref 4.0–10.5)
nRBC: 0 % (ref 0.0–0.2)

## 2023-04-01 ENCOUNTER — Other Ambulatory Visit: Payer: Self-pay

## 2023-04-01 ENCOUNTER — Ambulatory Visit
Admission: RE | Admit: 2023-04-01 | Discharge: 2023-04-01 | Disposition: A | Discharge status: Home / Self Care | Payer: Medicare Other | Source: Ambulatory Visit | Attending: Obstetrics and Gynecology | Admitting: Obstetrics and Gynecology

## 2023-04-01 ENCOUNTER — Encounter
Admission: RE | Disposition: A | Discharge status: Home / Self Care | Payer: Self-pay | Source: Ambulatory Visit | Attending: Obstetrics and Gynecology

## 2023-04-01 ENCOUNTER — Ambulatory Visit: Payer: Medicare Other | Admitting: Urgent Care

## 2023-04-01 ENCOUNTER — Ambulatory Visit: Payer: Medicare Other | Admitting: Anesthesiology

## 2023-04-01 ENCOUNTER — Encounter: Payer: Self-pay | Admitting: Obstetrics and Gynecology

## 2023-04-01 DIAGNOSIS — N7011 Chronic salpingitis: Secondary | ICD-10-CM | POA: Insufficient documentation

## 2023-04-01 DIAGNOSIS — K219 Gastro-esophageal reflux disease without esophagitis: Secondary | ICD-10-CM | POA: Insufficient documentation

## 2023-04-01 DIAGNOSIS — G8929 Other chronic pain: Secondary | ICD-10-CM | POA: Diagnosis not present

## 2023-04-01 DIAGNOSIS — N83201 Unspecified ovarian cyst, right side: Secondary | ICD-10-CM | POA: Diagnosis not present

## 2023-04-01 DIAGNOSIS — N803 Endometriosis of pelvic peritoneum, unspecified: Secondary | ICD-10-CM | POA: Insufficient documentation

## 2023-04-01 DIAGNOSIS — N83202 Unspecified ovarian cyst, left side: Secondary | ICD-10-CM | POA: Insufficient documentation

## 2023-04-01 DIAGNOSIS — N736 Female pelvic peritoneal adhesions (postinfective): Secondary | ICD-10-CM | POA: Diagnosis not present

## 2023-04-01 DIAGNOSIS — R102 Pelvic and perineal pain: Secondary | ICD-10-CM

## 2023-04-01 DIAGNOSIS — Z9884 Bariatric surgery status: Secondary | ICD-10-CM | POA: Diagnosis not present

## 2023-04-01 DIAGNOSIS — Z90710 Acquired absence of both cervix and uterus: Secondary | ICD-10-CM | POA: Insufficient documentation

## 2023-04-01 DIAGNOSIS — F418 Other specified anxiety disorders: Secondary | ICD-10-CM | POA: Insufficient documentation

## 2023-04-01 HISTORY — PX: XI ROBOT ASSISTED DIAGNOSTIC LAPAROSCOPY: SHX6815

## 2023-04-01 HISTORY — PX: ROBOTIC ASSISTED BILATERAL SALPINGO OOPHERECTOMY: SHX6078

## 2023-04-01 HISTORY — PX: ROBOTIC ASSISTED LAPAROSCOPIC LYSIS OF ADHESION: SHX6080

## 2023-04-01 SURGERY — SALPINGO-OOPHORECTOMY, BILATERAL, ROBOT-ASSISTED
Anesthesia: General

## 2023-04-01 MED ORDER — ROCURONIUM BROMIDE 10 MG/ML (PF) SYRINGE
PREFILLED_SYRINGE | INTRAVENOUS | Status: AC
  Filled 2023-04-01: qty 10

## 2023-04-01 MED ORDER — ACETAMINOPHEN EXTRA STRENGTH 500 MG PO TABS: 1000.0000 mg | ORAL_TABLET | Freq: Four times a day (QID) | ORAL | 0 refills | Status: AC

## 2023-04-01 MED ORDER — BUPIVACAINE HCL (PF) 0.5 % IJ SOLN
INTRAMUSCULAR | Status: DC | PRN
  Administered 2023-04-01: 10 mL

## 2023-04-01 MED ORDER — ORAL CARE MOUTH RINSE: 15.0000 mL | Freq: Once | OROMUCOSAL | Status: AC

## 2023-04-01 MED ORDER — PROPOFOL 1000 MG/100ML IV EMUL
INTRAVENOUS | Status: AC
  Filled 2023-04-01: qty 100

## 2023-04-01 MED ORDER — PROPOFOL 500 MG/50ML IV EMUL
INTRAVENOUS | Status: DC | PRN
  Administered 2023-04-01: 150 ug/kg/min via INTRAVENOUS

## 2023-04-01 MED ORDER — LIDOCAINE HCL (PF) 2 % IJ SOLN
INTRAMUSCULAR | Status: AC
  Filled 2023-04-01: qty 5

## 2023-04-01 MED ORDER — FENTANYL CITRATE (PF) 100 MCG/2ML IJ SOLN
25.0000 ug | INTRAMUSCULAR | Status: DC | PRN
  Administered 2023-04-01 (×2): 25 ug via INTRAVENOUS
  Administered 2023-04-01: 50 ug via INTRAVENOUS

## 2023-04-01 MED ORDER — MENTHOL 3 MG MT LOZG
1.0000 | LOZENGE | OROMUCOSAL | Status: DC | PRN
  Administered 2023-04-01: 3 mg via ORAL
  Filled 2023-04-01: qty 9

## 2023-04-01 MED ORDER — ROCURONIUM BROMIDE 100 MG/10ML IV SOLN
INTRAVENOUS | Status: DC | PRN
  Administered 2023-04-01: 20 mg via INTRAVENOUS
  Administered 2023-04-01: 50 mg via INTRAVENOUS

## 2023-04-01 MED ORDER — ACETAMINOPHEN 10 MG/ML IV SOLN: 1000.0000 mg | Freq: Once | INTRAVENOUS | Status: DC | PRN

## 2023-04-01 MED ORDER — ARTIFICIAL TEARS OPHTHALMIC OINT
TOPICAL_OINTMENT | OPHTHALMIC | Status: AC
  Filled 2023-04-01: qty 3.5

## 2023-04-01 MED ORDER — LIDOCAINE HCL (CARDIAC) PF 100 MG/5ML IV SOSY
PREFILLED_SYRINGE | INTRAVENOUS | Status: DC | PRN
  Administered 2023-04-01: 60 mg via INTRAVENOUS

## 2023-04-01 MED ORDER — BUPIVACAINE HCL (PF) 0.5 % IJ SOLN
INTRAMUSCULAR | Status: AC
  Filled 2023-04-01: qty 30

## 2023-04-01 MED ORDER — OXYCODONE HCL 5 MG PO TABS: 5.0000 mg | ORAL_TABLET | ORAL | 0 refills | Status: DC | PRN

## 2023-04-01 MED ORDER — KETOROLAC TROMETHAMINE 30 MG/ML IJ SOLN
INTRAMUSCULAR | Status: DC | PRN
  Administered 2023-04-01: 30 mg via INTRAVENOUS

## 2023-04-01 MED ORDER — MIDAZOLAM HCL 2 MG/2ML IJ SOLN
INTRAMUSCULAR | Status: DC | PRN
  Administered 2023-04-01: 2 mg via INTRAVENOUS

## 2023-04-01 MED ORDER — ONDANSETRON HCL 4 MG/2ML IJ SOLN
INTRAMUSCULAR | Status: AC
  Filled 2023-04-01: qty 2

## 2023-04-01 MED ORDER — ACETAMINOPHEN 500 MG PO TABS: 1000.0000 mg | ORAL_TABLET | ORAL | Status: DC

## 2023-04-01 MED ORDER — GABAPENTIN 300 MG PO CAPS
300.0000 mg | ORAL_CAPSULE | ORAL | Status: AC
  Administered 2023-04-01: 300 mg via ORAL

## 2023-04-01 MED ORDER — FENTANYL CITRATE (PF) 100 MCG/2ML IJ SOLN
INTRAMUSCULAR | Status: AC
Start: 2023-04-01 — End: ?
  Filled 2023-04-01: qty 2

## 2023-04-01 MED ORDER — OXYCODONE HCL 5 MG PO TABS
ORAL_TABLET | ORAL | Status: AC
  Filled 2023-04-01: qty 1

## 2023-04-01 MED ORDER — HEMOSTATIC AGENTS (NO CHARGE) OPTIME
TOPICAL | Status: DC | PRN
  Administered 2023-04-01: 2 via TOPICAL

## 2023-04-01 MED ORDER — GABAPENTIN 300 MG PO CAPS
ORAL_CAPSULE | ORAL | Status: AC
  Filled 2023-04-01: qty 1

## 2023-04-01 MED ORDER — OXYCODONE HCL 5 MG PO TABS
5.0000 mg | ORAL_TABLET | Freq: Once | ORAL | Status: AC | PRN
  Administered 2023-04-01: 5 mg via ORAL

## 2023-04-01 MED ORDER — PROPOFOL 10 MG/ML IV BOLUS
INTRAVENOUS | Status: AC
  Filled 2023-04-01: qty 20

## 2023-04-01 MED ORDER — FENTANYL CITRATE (PF) 100 MCG/2ML IJ SOLN
INTRAMUSCULAR | Status: AC
  Filled 2023-04-01: qty 2

## 2023-04-01 MED ORDER — FENTANYL CITRATE (PF) 100 MCG/2ML IJ SOLN
INTRAMUSCULAR | Status: DC | PRN
  Administered 2023-04-01 (×4): 50 ug via INTRAVENOUS

## 2023-04-01 MED ORDER — CELECOXIB 200 MG PO CAPS
200.0000 mg | ORAL_CAPSULE | Freq: Two times a day (BID) | ORAL | 0 refills | Status: AC
Start: 2023-04-01 — End: 2023-04-04

## 2023-04-01 MED ORDER — POVIDONE-IODINE 10 % EX SWAB
2.0000 | Freq: Once | CUTANEOUS | Status: AC
  Administered 2023-04-01: 2 via TOPICAL

## 2023-04-01 MED ORDER — LACTATED RINGERS IV SOLN: INTRAVENOUS | Status: DC

## 2023-04-01 MED ORDER — DEXMEDETOMIDINE HCL IN NACL 80 MCG/20ML IV SOLN
INTRAVENOUS | Status: DC | PRN
Start: 2023-04-01 — End: 2023-04-01
  Administered 2023-04-01: 8 ug via INTRAVENOUS

## 2023-04-01 MED ORDER — MIDAZOLAM HCL 2 MG/2ML IJ SOLN
INTRAMUSCULAR | Status: AC
  Filled 2023-04-01: qty 2

## 2023-04-01 MED ORDER — CHLORHEXIDINE GLUCONATE 0.12 % MT SOLN
OROMUCOSAL | Status: AC
  Filled 2023-04-01: qty 15

## 2023-04-01 MED ORDER — CHLORHEXIDINE GLUCONATE 0.12 % MT SOLN
15.0000 mL | Freq: Once | OROMUCOSAL | Status: AC
  Administered 2023-04-01: 15 mL via OROMUCOSAL

## 2023-04-01 MED ORDER — DEXAMETHASONE SODIUM PHOSPHATE 10 MG/ML IJ SOLN
INTRAMUSCULAR | Status: AC
  Filled 2023-04-01: qty 1

## 2023-04-01 MED ORDER — ONDANSETRON 4 MG PO TBDP: 4.0000 mg | ORAL_TABLET | Freq: Three times a day (TID) | ORAL | 0 refills | Status: AC | PRN

## 2023-04-01 MED ORDER — PROPOFOL 10 MG/ML IV BOLUS
INTRAVENOUS | Status: DC | PRN
  Administered 2023-04-01: 200 mg via INTRAVENOUS

## 2023-04-01 MED ORDER — GABAPENTIN 300 MG PO CAPS: 300.0000 mg | ORAL_CAPSULE | Freq: Every day | ORAL | 0 refills | Status: DC

## 2023-04-01 MED ORDER — OXYCODONE HCL 5 MG/5ML PO SOLN: 5.0000 mg | Freq: Once | ORAL | Status: AC | PRN

## 2023-04-01 MED ORDER — ONDANSETRON HCL 4 MG/2ML IJ SOLN
INTRAMUSCULAR | Status: DC | PRN
  Administered 2023-04-01: 4 mg via INTRAVENOUS

## 2023-04-01 MED ORDER — ONDANSETRON HCL 4 MG/2ML IJ SOLN: 4.0000 mg | Freq: Once | INTRAMUSCULAR | Status: DC | PRN

## 2023-04-01 MED ORDER — DEXAMETHASONE SODIUM PHOSPHATE 10 MG/ML IJ SOLN
INTRAMUSCULAR | Status: DC | PRN
  Administered 2023-04-01: 5 mg via INTRAVENOUS

## 2023-04-01 MED ORDER — SUGAMMADEX SODIUM 200 MG/2ML IV SOLN
INTRAVENOUS | Status: DC | PRN
  Administered 2023-04-01: 200 mg via INTRAVENOUS

## 2023-04-01 SURGICAL SUPPLY — 56 items
APPLICATOR ARISTA FLEXITIP XL (MISCELLANEOUS) IMPLANT
BAG URINE DRAIN 2000ML AR STRL (UROLOGICAL SUPPLIES) ×3 IMPLANT
BLADE SURG SZ11 CARB STEEL (BLADE) ×3 IMPLANT
CATH URTH 16FR FL 2W BLN LF (CATHETERS) ×3 IMPLANT
COUNTER NEEDLE 20/40 LG (NEEDLE) ×3 IMPLANT
COVER TIP SHEARS 8 DVNC (MISCELLANEOUS) ×3 IMPLANT
COVER WAND RF STERILE (DRAPES) ×3 IMPLANT
DERMABOND ADVANCED .7 DNX12 (GAUZE/BANDAGES/DRESSINGS) ×3 IMPLANT
DRAPE ARM DVNC X/XI (DISPOSABLE) ×9 IMPLANT
DRAPE COLUMN DVNC XI (DISPOSABLE) ×3 IMPLANT
DRAPE SHEET LG 3/4 BI-LAMINATE (DRAPES) ×3 IMPLANT
DRSG TELFA 3X8 NADH STRL (GAUZE/BANDAGES/DRESSINGS) IMPLANT
ELECT REM PT RETURN 9FT ADLT (ELECTROSURGICAL) ×2 IMPLANT
ELECTRODE REM PT RTRN 9FT ADLT (ELECTROSURGICAL) ×3 IMPLANT
FORCEPS BPLR FENES DVNC XI (FORCEP) ×3 IMPLANT
GLOVE BIO SURGEON STRL SZ7 (GLOVE) ×12 IMPLANT
GLOVE INDICATOR 7.5 STRL GRN (GLOVE) ×12 IMPLANT
GOWN STRL REUS W/ TWL LRG LVL3 (GOWN DISPOSABLE) ×12 IMPLANT
GRASPER SUT TROCAR 14GX15 (MISCELLANEOUS) ×3 IMPLANT
HEMOSTAT ARISTA ABSORB 1G (HEMOSTASIS) IMPLANT
HEMOSTAT ARISTA ABSORB 3G PWDR (HEMOSTASIS) IMPLANT
IRRIGATION STRYKERFLOW (MISCELLANEOUS) IMPLANT
IRRIGATOR STRYKERFLOW (MISCELLANEOUS) ×2 IMPLANT
IV NS 1000ML BAXH (IV SOLUTION) IMPLANT
KIT PINK PAD W/HEAD ARE REST (MISCELLANEOUS) ×2 IMPLANT
KIT PINK PAD W/HEAD ARM REST (MISCELLANEOUS) ×3 IMPLANT
LABEL OR SOLS (LABEL) ×3 IMPLANT
MANIFOLD NEPTUNE II (INSTRUMENTS) ×3 IMPLANT
MANIPULATOR UTERINE 4.5 ZUMI (MISCELLANEOUS) ×3 IMPLANT
NDL DRIVE SUT CUT DVNC (INSTRUMENTS) ×3 IMPLANT
NEEDLE DRIVE SUT CUT DVNC (INSTRUMENTS) IMPLANT
NS IRRIG 1000ML POUR BTL (IV SOLUTION) ×3 IMPLANT
OBTURATOR OPTICAL STND 8 DVNC (TROCAR) ×2 IMPLANT
OBTURATOR OPTICALSTD 8 DVNC (TROCAR) ×3 IMPLANT
PACK GYN LAPAROSCOPIC (MISCELLANEOUS) ×3 IMPLANT
PAD OB MATERNITY 11 LF (PERSONAL CARE ITEMS) ×3 IMPLANT
PAD PREP OB/GYN DISP 24X41 (PERSONAL CARE ITEMS) ×3 IMPLANT
SCISSORS MNPLR CVD DVNC XI (INSTRUMENTS) ×3 IMPLANT
SCRUB CHG 4% DYNA-HEX 4OZ (MISCELLANEOUS) ×3 IMPLANT
SEAL UNIV 5-12 XI (MISCELLANEOUS) ×9 IMPLANT
SEALER VESSEL EXT DVNC XI (MISCELLANEOUS) IMPLANT
SET CYSTO W/LG BORE CLAMP LF (SET/KITS/TRAYS/PACK) IMPLANT
SET TUBE SMOKE EVAC HIGH FLOW (TUBING) ×3 IMPLANT
SOL ELECTROSURG ANTI STICK (MISCELLANEOUS) ×2 IMPLANT
SOL PREP PVP 2OZ (MISCELLANEOUS) ×2 IMPLANT
SOLUTION ELECTROSURG ANTI STCK (MISCELLANEOUS) ×3 IMPLANT
SOLUTION PREP PVP 2OZ (MISCELLANEOUS) ×3 IMPLANT
SURGILUBE 2OZ TUBE FLIPTOP (MISCELLANEOUS) ×3 IMPLANT
SUT MNCRL 4-0 27 PS-2 XMFL (SUTURE) ×2 IMPLANT
SUT STRATA 2-0 30 CT-2 (SUTURE) IMPLANT
SUT VIC AB 0 CT2 27 (SUTURE) ×6 IMPLANT
SUTURE MNCRL 4-0 27XMF (SUTURE) ×3 IMPLANT
SYR 50ML LL SCALE MARK (SYRINGE) ×3 IMPLANT
SYS RETRIEVAL 5MM INZII UNIV (BASKET) ×2 IMPLANT
SYSTEM RETRIEVL 5MM INZII UNIV (BASKET) IMPLANT
WATER STERILE IRR 500ML POUR (IV SOLUTION) ×3 IMPLANT

## 2023-04-01 NOTE — Op Note (Signed)
[name] PROCEDURE DATE: 04/01/2023  PREOPERATIVE DIAGNOSIS: Pelvic pain with a known hx of endometriosis, right sided adnexal mass POSTOPERATIVE DIAGNOSIS: right hydrosalpinx, endometriosis and pelvic adhesive disease PROCEDURE:  XI ROBOTIC ASSISTED BILATERAL SALPINGO OOPHORECTOMY,: 16109 (CPT) XI ROBOTIC ASSISTED LAPAROSCOPIC LYSIS OF ADHESION: 60454 (CPT) XI ROBOT ASSISTED DIAGNOSTIC LAPAROSCOPY, POSSIBLE EXCISION OF ENDOMETRIOSIS, REMOVAL OF PELVIC MASS: 49320 (CPT) Right ureterolysis for peritoneal fibrosis, possible CPT code 09811  SURGEON:  Dr. [name], MD ASSISTANT: RNFA Anesthesiologist:  Anesthesiologist: Corinda Gubler, MD CRNA: Elmarie Mainland, CRNA; Otho Perl, CRNA  INDICATIONS: 48 y.o. F here for definitive surgical management secondary to the indications listed under preoperative diagnoses; please see preoperative note for further details.   Hx of chronic pelvic pain with right hydrosalpinx  Risks of surgery were discussed with the patient including but not limited to: bleeding which may require transfusion or reoperation; infection which may require antibiotics; injury to bowel, bladder, ureters or other surrounding organs; need for additional procedures; thromboembolic phenomenon, incisional problems and other postoperative/anesthesia complications. Written informed consent was obtained.    FINDINGS:   Pelvic: External genitalia negative for lesions. Vagina negative. Adnexa negative for masses or nodularity.  Intraoperative findings revealed a normal upper abdomen including bowel, diaphragmatic surfaces, stomach, and omentum. Minimal upper abdominal adhesions  The right ovary appeared normal.  There was an approximately 2 cm hydrosalpinx wrapped around the right ovary, which was removed.  There was also a deep infiltrating right ovarian fossa endometrial implant, which required ureterolysis to remove entirely.  The left ovary appeared normal  with cysts.  No left fallopian tube noted.  ANESTHESIA:    General INTRAVENOUS FLUIDS:800  ml ESTIMATED BLOOD LOSS:10 ml URINE OUTPUT: 150 ml  SPECIMENS: Bilateral ovaries; endometriosis implant from right peritoneal sidewall.  Right sided hydrosalpinx.  COMPLICATIONS: None immediate  PROCEDURE IN DETAIL: After informed consent was obtained, the patient was taken to the operating room where general anesthesia was obtained without difficulty. The patient was positioned in the dorsal lithotomy position in [place] stirrups and her arms were carefully tucked at her sides and the usual precautions were taken. Deep Trendelenburg (20-25 deg) was established to confirm that she does not shift on the table.  She was prepped and draped in normal sterile fashion.  Time-out was performed and a Foley catheter was placed into the bladder. A uterine sound was steri-striped to the single toothed tenaculum at the cervix to use as uterine manipulation.  After infiltration of local anesthetic at the proposed trocar sites, an 8 mm incision was created infraumbilically, and a robotic 8mm port was placed under direct visualization. Pneumoperitoneum was created to a pressure of 11 mm Hg. The camera was placed and the abdomin surveyed, noting intact bowel below the site of entry. A survey of the pelvis and upper abdomen revealed the above findings. Right lateral 8-mm robotic port was placed under direct visualization and an 8mm left port similarly placed.  The patient was placed in deepTrendelenburg and the bowel was displaced up into the upper abdomen. The robot was left side docked. The instruments were placed under direct visualization.   The ureters were identified bilaterally coursing outside of the operative field. Round ligaments were divided on each side with the Vessel sealer and the retroperitoneal space was opened bilaterally.  Ovariolysis was performed and the left ovary was dissected medially with care to  avoid the ureter.  The infundibulopelvic ligaments were skeletonized, sealed and divided. Attention turned to the right side, and  the same procedure performed. The specimens were placed in an Endocatch bag.  The area of endometriosis implant was noted to be directly over the right ureter. Right ureterolysis revealed a deep implant with adhesions to the pelvic side wall. The ureter was separated from the underlying tissue and dissected away from the peritoneal implant. The implant was removed intact. Hemostasis was secured with light cautery, staying well away from the ureter, which was visible and peristalsing throughout the dissection. Arista placed over oozing in this area.  The specimens were removed intact from the right lower quadrant port.  Hemostasis was confirmed with suction-irrigation.The intraperitoneal pressure was dropped, and all planes of dissection, vascular pedicles  were found to be hemostatic.  The 12mm port fascia was closed with a figure of eight 2-0 vicryl.  The robot was undocked. The lateral trocars were removed under visualization.  The CO2 gas was released and several deep breaths given to remove any remaining CO2 from the peritoneal cavity.  The skin incisions were closed with 4-0 Monocryl subcuticular stitch and dermabond.  Anesthesia was reversed without difficulty.  Sponge, lap and needle counts were correct x2.  The patient tolerated the procedure well and was taken to the recovery area awake and in stable condition. She received iv acetaminophen and Toradol prior to leaving the OR.  The patient will be discharged to home as per PACU criteria. Routine postoperative instructions given. She was prescribed ERAS meds. She will follow up in the clinic in two weeks for postoperative evaluation.

## 2023-04-01 NOTE — Anesthesia Postprocedure Evaluation (Signed)
Anesthesia Post Note  Patient: [name]  Procedure(s) Performed: XI ROBOTIC ASSISTED BILATERAL SALPINGO OOPHORECTOMY, (Bilateral) XI ROBOTIC ASSISTED LAPAROSCOPIC LYSIS OF ADHESION XI ROBOT ASSISTED DIAGNOSTIC LAPAROSCOPY, POSSIBLE EXCISION OF ENDOMETRIOSIS, REMOVAL OF PELVIC MASS  Patient location during evaluation: PACU Anesthesia Type: General Level of consciousness: awake and alert Pain management: pain level controlled Vital Signs Assessment: post-procedure vital signs reviewed and stable Respiratory status: spontaneous breathing, nonlabored ventilation, respiratory function stable and patient connected to nasal cannula oxygen Cardiovascular status: blood pressure returned to baseline and stable Postop Assessment: no apparent nausea or vomiting Anesthetic complications: yes Comments:  Patient ccomplains of severe sore throat and odynophagia in pacu. Describes the pain as in her throat. Unremarkable visual exam of oropharynx, no erythema or trauma noted.  Patient was reassured that these risks, which were discussed preop, do occasionally happen, and should spontaneously resolve with conservative measures. Cepacol spray ordered.   Encounter Notable Events  Notable Event Outcome Phase Comment  Other oropharyngeal injury / pain  In Recovery Complaints of severe sore throat discomfort, odynophagia. No visible trauma on external oral exam, uneventful intubation intraop     Last Vitals:  Vitals:   04/01/23 1000 04/01/23 1015  BP: 107/75 (!) 89/49  Pulse: 75 88  Resp: 16 (!) 21  Temp:    SpO2: 97% 98%    Last Pain:  Vitals:   04/01/23 1015  TempSrc:   PainSc: 6                  Corinda Gubler

## 2023-04-01 NOTE — Interval H&P Note (Signed)
History and Physical Interval Note:  04/01/2023 7:20 AM  [name]  has presented today for surgery, with the diagnosis of acute pelvic pain, ovarian cyst, adnexal mass.  The various methods of treatment have been discussed with the patient and family. After consideration of risks, benefits and other options for treatment, the patient has consented to  Procedure(s): XI ROBOTIC ASSISTED BILATERAL SALPINGO OOPHORECTOMY, (Bilateral) XI ROBOTIC ASSISTED LAPAROSCOPIC LYSIS OF ADHESION (N/A) XI ROBOT ASSISTED DIAGNOSTIC LAPAROSCOPY, POSSIBLE EXCISION OF ENDOMETRIOSIS, REMOVAL OF PELVIC MASS (N/A) as a surgical intervention.  The patient's history has been reviewed, patient examined, no change in status, stable for surgery.  I have reviewed the patient's chart and labs.  Questions were answered to the patient's satisfaction.     [name]

## 2023-04-01 NOTE — Anesthesia Preprocedure Evaluation (Signed)
Anesthesia Evaluation  Patient identified by MRN, date of birth, ID band Patient awake    Reviewed: Allergy & Precautions, NPO status , Patient's Chart, lab work & pertinent test results  History of Anesthesia Complications Negative for: history of anesthetic complications  Airway Mallampati: II  TM Distance: >3 FB Neck ROM: Full    Dental no notable dental hx. (+) Teeth Intact   Pulmonary asthma , neg sleep apnea, neg COPD, Patient abstained from smoking.Not current smoker   Pulmonary exam normal breath sounds clear to auscultation       Cardiovascular Exercise Tolerance: Good METS(-) hypertension(-) CAD and (-) Past MI negative cardio ROS (-) dysrhythmias  Rhythm:Regular Rate:Normal - Systolic murmurs    Neuro/Psych  Headaches PSYCHIATRIC DISORDERS Anxiety Depression       GI/Hepatic ,GERD  Medicated and Controlled,,(+)     (-) substance abuse    Endo/Other  neg diabetes    Renal/GU negative Renal ROS     Musculoskeletal   Abdominal   Peds  Hematology   Anesthesia Other Findings Past Medical History: No date: Abnormal ECG No date: Anemia No date: Anxiety No date: Arthritis No date: Asthma No date: B12 deficiency No date: Cardiomyopathy [facility]) No date: Chronic abdominal pain No date: Complication of anesthesia     Comment:  difficulty to get sedated during endoscopy 01/18/2019: COVID-19 No date: DDD (degenerative disc disease), lumbar No date: Depression No date: Diabetes mellitus without complication (HCC)     Comment:  ONLY gestational diabetes, does not have chronic               diabetes No date: Dysrhythmia No date: Family history of adverse reaction to anesthesia     Comment:  brother woke during surgery No date: GERD (gastroesophageal reflux disease) No date: History of kidney stones No date: Iron deficiency No date: Migraine headache No date: Neuropathy No date: Obesity     Comment:   gastric bypass 2009 No date: Ovarian cyst No date: Pelvic mass No date: Pneumonia     Comment:  within past five years No date: PTSD (post-traumatic stress disorder) No date: PTSD (post-traumatic stress disorder) No date: Rh negative status during pregnancy No date: Seizures (HCC)     Comment:  post-gestational No date: Small bowel obstruction (HCC) No date: Vitamin D deficiency  Reproductive/Obstetrics                              Anesthesia Physical Anesthesia Plan  ASA: 2  Anesthesia Plan: General   Post-op Pain Management: Ofirmev IV (intra-op)*, Toradol IV (intra-op)* and Gabapentin PO (pre-op)*   Induction: Intravenous  PONV Risk Score and Plan: 3 and Ondansetron, Dexamethasone and Midazolam  Airway Management Planned: LMA  Additional Equipment: None  Intra-op Plan:   Post-operative Plan: Extubation in OR  Informed Consent: I have reviewed the patients History and Physical, chart, labs and discussed the procedure including the risks, benefits and alternatives for the proposed anesthesia with the patient or authorized representative who has indicated his/her understanding and acceptance.     Dental advisory given  Plan Discussed with: CRNA and Surgeon  Anesthesia Plan Comments: (Discussed risks of anesthesia with patient, including PONV, sore throat, lip/dental/eye damage. Rare risks discussed as well, such as cardiorespiratory and neurological sequelae, and allergic reactions. Discussed the role of CRNA in patient's perioperative care. Patient understands.)         Anesthesia Quick Evaluation

## 2023-04-01 NOTE — Discharge Instructions (Signed)
Laparoscopic Ovarian Surgery Discharge Instructions  For the next three days, take ibuprofen and acetaminophen on a schedule, every 8 hours. You can take them together or you can intersperse them, and take one every four hours. I also gave you gabapentin for nighttime, to help you sleep and also to control pain. Take gabapentin medicines at night for at least the next 3 nights. You also have a narcotic, oxycodone, to take as needed if the above medicines don't help.  Postop constipation is a major cause of pain. Stay well hydrated, walk as you tolerate, and take over the counter senna as well as stool softeners if you need them.   RISKS AND COMPLICATIONS  Infection. Bleeding. Injury to surrounding organs. Anesthetic side effects.   PROCEDURE  You may be given a medicine to help you relax (sedative) before the procedure. You will be given a medicine to make you sleep (general anesthetic) during the procedure. A tube will be put down your throat to help your breath while under general anesthesia. Several small cuts (incisions) are made in the lower abdominal area and one incision is made near the belly button. Your abdominal area will be inflated with a safe gas (carbon dioxide). This helps give the surgeon room to operate, visualize, and helps the surgeon avoid other organs. A thin, lighted tube (laparoscope) with a camera attached is inserted into your abdomen through the incision near the belly button. Other small instruments may also be inserted through other abdominal incisions. The ovary is located and are removed. After the ovary is removed, the gas is released from the abdomen. The incisions will be closed with stitches (sutures), and Dermabond. A bandage may be placed over the incisions.  AFTER THE PROCEDURE  You will also have some mild abdominal discomfort for 3-7 days. You will be given pain medicine to ease any discomfort. As long as there are no problems, you may be allowed to  go home. Someone will need to drive you home and be with you for at least 24 hours once home. You may have some mild discomfort in the throat. This is from the tube placed in your throat while you were sleeping. You may experience discomfort in the shoulder area from some trapped air between the liver and diaphragm. This sensation is normal and will slowly go away on its own.  HOME CARE INSTRUCTIONS  Take all medicines as directed. Only take over-the-counter or prescription medicines for pain, discomfort, or fever as directed by your caregiver. Resume daily activities as directed. Showers are preferred over baths for 2 weeks. You may resume sexual activities in 1 week or as you feel you would like to. Do not drive while taking narcotics.  SEEK MEDICAL CARE IF: . There is increasing abdominal pain. You feel lightheaded or faint. You have the chills. You have an oral temperature above 102 F (38.9 C). There is pus-like (purulent) drainage from any of the wounds. You are unable to pass gas or have a bowel movement. You feel sick to your stomach (nauseous) or throw up (vomit) and can't control it with your medicines.  MAKE SURE YOU:  Understand these instructions. Will watch your condition. Will get help right away if you are not doing well or get worse.  [employer] Patient Information 2013 [place], [place].

## 2023-04-01 NOTE — Anesthesia Procedure Notes (Signed)
Procedure Name: Intubation Date/Time: 04/01/2023 7:36 AM  Performed by: Otho Perl, CRNAPre-anesthesia Checklist: Patient identified, Patient being monitored, Timeout performed, Emergency Drugs available and Suction available Patient Re-evaluated:Patient Re-evaluated prior to induction Oxygen Delivery Method: Circle system utilized Preoxygenation: Pre-oxygenation with 100% oxygen Induction Type: IV induction Ventilation: Mask ventilation without difficulty Laryngoscope Size: Miller and 2 Grade View: Grade I Tube type: Oral Tube size: 6.5 mm Number of attempts: 1 Airway Equipment and Method: Stylet Placement Confirmation: ETT inserted through vocal cords under direct vision, positive ETCO2 and breath sounds checked- equal and bilateral Secured at: 20 cm Tube secured with: Tape Dental Injury: Teeth and Oropharynx as per pre-operative assessment

## 2023-04-01 NOTE — Transfer of Care (Signed)
Immediate Anesthesia Transfer of Care Note  Patient: [name]  Procedure(s) Performed: XI ROBOTIC ASSISTED BILATERAL SALPINGO OOPHORECTOMY, (Bilateral) XI ROBOTIC ASSISTED LAPAROSCOPIC LYSIS OF ADHESION XI ROBOT ASSISTED DIAGNOSTIC LAPAROSCOPY, POSSIBLE EXCISION OF ENDOMETRIOSIS, REMOVAL OF PELVIC MASS  Patient Location: PACU  Anesthesia Type:General  Level of Consciousness: sedated  Airway & Oxygen Therapy: Patient Spontanous Breathing and Patient connected to nasal cannula oxygen  Post-op Assessment: Report given to RN and Post -op Vital signs reviewed and stable  Post vital signs: Reviewed and stable  Last Vitals:  Vitals Value Taken Time  BP 112/73 04/01/23 0932  Temp 36.1 C 04/01/23 0930  Pulse 66 04/01/23 0932  Resp 13 04/01/23 0932  SpO2 100 % 04/01/23 0932  Vitals shown include unfiled device data.  Last Pain:  Vitals:   04/01/23 0930  TempSrc:   PainSc: 0-No pain         Complications: No notable events documented.

## 2023-04-04 ENCOUNTER — Encounter: Payer: Self-pay | Admitting: Obstetrics and Gynecology

## 2023-04-04 LAB — SURGICAL PATHOLOGY

## 2023-04-05 ENCOUNTER — Emergency Department
Admission: EM | Admit: 2023-04-05 | Discharge: 2023-04-06 | Disposition: A | Discharge status: Home / Self Care | Payer: Medicare Other | Attending: Student in an Organized Health Care Education/Training Program | Admitting: Student in an Organized Health Care Education/Training Program

## 2023-04-05 ENCOUNTER — Emergency Department: Payer: Medicare Other

## 2023-04-05 ENCOUNTER — Other Ambulatory Visit: Payer: Self-pay

## 2023-04-05 DIAGNOSIS — J101 Influenza due to other identified influenza virus with other respiratory manifestations: Secondary | ICD-10-CM | POA: Diagnosis not present

## 2023-04-05 DIAGNOSIS — R079 Chest pain, unspecified: Secondary | ICD-10-CM | POA: Diagnosis present

## 2023-04-05 LAB — COMPREHENSIVE METABOLIC PANEL
ALT: 111 U/L — ABNORMAL HIGH (ref 0–44)
AST: 68 U/L — ABNORMAL HIGH (ref 15–41)
Albumin: 3.2 g/dL — ABNORMAL LOW (ref 3.5–5.0)
Alkaline Phosphatase: 67 U/L (ref 38–126)
Anion gap: 11 (ref 5–15)
BUN: 10 mg/dL (ref 6–20)
CO2: 19 mmol/L — ABNORMAL LOW (ref 22–32)
Calcium: 8.5 mg/dL — ABNORMAL LOW (ref 8.9–10.3)
Chloride: 107 mmol/L (ref 98–111)
Creatinine, Ser: 0.69 mg/dL (ref 0.44–1.00)
GFR, Estimated: >60 mL/min (ref >60)
Glucose, Bld: 106 mg/dL — ABNORMAL HIGH (ref 70–99)
Potassium: 3.8 mmol/L (ref 3.5–5.1)
Sodium: 137 mmol/L (ref 135–145)
Total Bilirubin: 0.4 mg/dL (ref 0.0–1.2)
Total Protein: 6.8 g/dL (ref 6.5–8.1)

## 2023-04-05 LAB — CBC WITH DIFFERENTIAL/PLATELET
Abs Immature Granulocytes: 0.16 10*3/uL — ABNORMAL HIGH (ref 0.00–0.07)
Basophils Absolute: 0 10*3/uL (ref 0.0–0.1)
Basophils Relative: 0 %
Eosinophils Absolute: 0 10*3/uL (ref 0.0–0.5)
Eosinophils Relative: 0 %
HCT: 41 % (ref 36.0–46.0)
Hemoglobin: 12.9 g/dL (ref 12.0–15.0)
Immature Granulocytes: 2 %
Lymphocytes Relative: 6 %
Lymphs Abs: 0.6 10*3/uL — ABNORMAL LOW (ref 0.7–4.0)
MCH: 28.9 pg (ref 26.0–34.0)
MCHC: 31.5 g/dL (ref 30.0–36.0)
MCV: 91.7 fL (ref 80.0–100.0)
Monocytes Absolute: 0.7 10*3/uL (ref 0.1–1.0)
Monocytes Relative: 7 %
Neutro Abs: 8.9 10*3/uL — ABNORMAL HIGH (ref 1.7–7.7)
Neutrophils Relative %: 85 %
Platelets: 297 10*3/uL (ref 150–400)
RBC: 4.47 MIL/uL (ref 3.87–5.11)
RDW: 14.2 % (ref 11.5–15.5)
WBC: 10.5 10*3/uL (ref 4.0–10.5)
nRBC: 0 % (ref 0.0–0.2)

## 2023-04-05 LAB — URINALYSIS, ROUTINE W REFLEX MICROSCOPIC
Bacteria, UA: NONE SEEN
Bilirubin Urine: NEGATIVE
Glucose, UA: NEGATIVE mg/dL
Hgb urine dipstick: NEGATIVE
Ketones, ur: 20 mg/dL — AB
Leukocytes,Ua: NEGATIVE
Nitrite: NEGATIVE
Protein, ur: 30 mg/dL — AB
Specific Gravity, Urine: 1.016 (ref 1.005–1.030)
pH: 7 (ref 5.0–8.0)

## 2023-04-05 LAB — TROPONIN I (HIGH SENSITIVITY)
Troponin I (High Sensitivity): 3 ng/L (ref <18)
Troponin I (High Sensitivity): 3 ng/L (ref <18)

## 2023-04-05 LAB — LIPASE, BLOOD: Lipase: 18 U/L (ref 11–51)

## 2023-04-05 LAB — POC URINE PREG, ED: Preg Test, Ur: NEGATIVE

## 2023-04-05 MED ORDER — OXYCODONE-ACETAMINOPHEN 5-325 MG PO TABS: 1.0000 | ORAL_TABLET | ORAL | 0 refills | Status: DC | PRN

## 2023-04-05 MED ORDER — FENTANYL CITRATE PF 50 MCG/ML IJ SOSY
50.0000 ug | PREFILLED_SYRINGE | INTRAMUSCULAR | Status: DC | PRN
  Administered 2023-04-05: 50 ug via INTRAVENOUS
  Filled 2023-04-05: qty 1

## 2023-04-05 MED ORDER — ONDANSETRON HCL 4 MG/2ML IJ SOLN
4.0000 mg | Freq: Once | INTRAMUSCULAR | Status: AC
  Administered 2023-04-05: 4 mg via INTRAVENOUS
  Filled 2023-04-05: qty 2

## 2023-04-05 MED ORDER — SODIUM CHLORIDE 0.9 % IV BOLUS
1000.0000 mL | Freq: Once | INTRAVENOUS | Status: AC
  Administered 2023-04-05: 1000 mL via INTRAVENOUS

## 2023-04-05 MED ORDER — IOHEXOL 350 MG/ML SOLN
75.0000 mL | Freq: Once | INTRAVENOUS | Status: AC | PRN
  Administered 2023-04-05: 75 mL via INTRAVENOUS

## 2023-04-05 MED ORDER — OXYCODONE-ACETAMINOPHEN 5-325 MG PO TABS
1.0000 | ORAL_TABLET | Freq: Once | ORAL | Status: AC
  Administered 2023-04-05: 1 via ORAL
  Filled 2023-04-05: qty 1

## 2023-04-05 MED ORDER — ONDANSETRON 4 MG PO TBDP: 4.0000 mg | ORAL_TABLET | Freq: Three times a day (TID) | ORAL | 0 refills | Status: DC | PRN

## 2023-04-05 MED ORDER — KETOROLAC TROMETHAMINE 30 MG/ML IJ SOLN
15.0000 mg | Freq: Once | INTRAMUSCULAR | Status: AC
  Administered 2023-04-05: 15 mg via INTRAVENOUS
  Filled 2023-04-05: qty 1

## 2023-04-05 NOTE — ED Provider Triage Note (Signed)
Emergency Medicine Provider Triage Evaluation Note  [name] , a 48 y.o. female  was evaluated in triage.  Pt complains of sudden onset SOB, right shoulder pain. Patient was diagnosed with pneumonia this morning at urgent care, started on levaquin. Advised to come the ED with any worsening.   Of note, patient had abdominal surgery on Friday.   Review of Systems  Positive: SOB worse with deep breaths, right shoulder pain, abdominal pain, hemoptysis Negative:   Physical Exam  BP 111/64 (BP Location: Left Arm)   Pulse (!) 108   Temp 98.8 F (37.1 C) (Oral)   Resp 17   Ht 5\' 7"  (1.702 m)   Wt 68 kg   LMP  (LMP Unknown)   SpO2 97%   BMI 23.49 kg/m  Gen:   Awake, no distress   Resp:  Normal effort  MSK:   Moves extremities without difficulty  Other:    Medical Decision Making  Medically screening exam initiated at 3:47 PM.  Appropriate orders placed.  SHAUNIECE KWAN was informed that the remainder of the evaluation will be completed by another provider, this initial triage assessment does not replace that evaluation, and the importance of remaining in the ED until their evaluation is complete.    Cameron Ali, PA-C 04/05/23 1554

## 2023-04-05 NOTE — ED Triage Notes (Signed)
Pt reports she had abdominal surgery on Friday (Bilateral Oophorectomy) but started developing chest pain and shortness of breath today. She went to Urgent Care and was diagnosed with Flu A and PNA, prescribed Levaquin and went home. She came to ER because the shortness of breath worsened and she is concerned for PE.

## 2023-04-05 NOTE — ED Provider Notes (Signed)
[facility] Provider Note    Event Date/Time   First MD Initiated Contact with Patient 04/05/23 2152     (approximate)   History   Chest Pain   HPI  [name] is a 48 y.o. female status post laparoscopic surgery with OB/GYN presents to the ER for evaluation of right-sided chest pain cough congestion started over the weekend.  Was seen at walk-in clinic was diagnosed with the flu and pneumonia given prescription for Tamiflu and Levaquin.  Has taken 1 dose of this.  Was taking some oxycodone for postoperative pain with relative control of her pain.     Physical Exam   Triage Vital Signs: ED Triage Vitals  Encounter Vitals Group     BP 04/05/23 1539 111/64     Systolic BP Percentile --      Diastolic BP Percentile --      Pulse Rate 04/05/23 1539 (!) 108     Resp 04/05/23 1539 17     Temp 04/05/23 1539 98.8 F (37.1 C)     Temp Source 04/05/23 1539 Oral     SpO2 04/05/23 1539 97 %     Weight 04/05/23 1539 150 lb (68 kg)     Height 04/05/23 1539 5\' 7"  (1.702 m)     Head Circumference --      Peak Flow --      Pain Score 04/05/23 1549 10     Pain Loc --      Pain Education --      Exclude from Growth Chart --     Most recent vital signs: Vitals:   04/05/23 1539 04/05/23 2136  BP: 111/64 99/72  Pulse: (!) 108 (!) 103  Resp: 17 20  Temp: 98.8 F (37.1 C) 98.9 F (37.2 C)  SpO2: 97% 96%     Constitutional: Alert  Eyes: Conjunctivae are normal.  Head: Atraumatic. Nose: No congestion/rhinnorhea. Mouth/Throat: Mucous membranes are moist.   Neck: Painless ROM.  Cardiovascular:   Good peripheral circulation. Respiratory: Mild tachypnea but speaking in complete phrases.  No hypoxia.  Does have rhonchi on right lung fields. Gastrointestinal: Soft and nontender.  Musculoskeletal:  no deformity Neurologic:  MAE spontaneously. No gross focal neurologic deficits are appreciated.  Skin:  Skin is warm, dry and intact. No rash  noted. Psychiatric: Mood and affect are normal. Speech and behavior are normal.    ED Results / Procedures / Treatments   Labs (all labs ordered are listed, but only abnormal results are displayed) Labs Reviewed  COMPREHENSIVE METABOLIC PANEL - Abnormal; Notable for the following components:      Result Value   CO2 19 (*)    Glucose, Bld 106 (*)    Calcium 8.5 (*)    Albumin 3.2 (*)    AST 68 (*)    ALT 111 (*)    All other components within normal limits  CBC WITH DIFFERENTIAL/PLATELET - Abnormal; Notable for the following components:   Neutro Abs 8.9 (*)    Lymphs Abs 0.6 (*)    Abs Immature Granulocytes 0.16 (*)    All other components within normal limits  URINALYSIS, ROUTINE W REFLEX MICROSCOPIC - Abnormal; Notable for the following components:   Color, Urine YELLOW (*)    APPearance HAZY (*)    Ketones, ur 20 (*)    Protein, ur 30 (*)    All other components within normal limits  LIPASE, BLOOD  POC URINE PREG, ED  TROPONIN I (HIGH  SENSITIVITY)  TROPONIN I (HIGH SENSITIVITY)     EKG  ED ECG REPORT I, Willy Eddy, the attending physician, personally viewed and interpreted this ECG.   Date: 04/05/2023  EKG Time: 21:41  Rate: 105  Rhythm: sinus  Axis: normal  Intervals: normal  ST&T Change: no stemi, no depressions    RADIOLOGY Please see ED Course for my review and interpretation.  I personally reviewed all radiographic images ordered to evaluate for the above acute complaints and reviewed radiology reports and findings.  These findings were personally discussed with the patient.  Please see medical record for radiology report.    PROCEDURES:  Critical Care performed:   Procedures   MEDICATIONS ORDERED IN ED: Medications  fentaNYL (SUBLIMAZE) injection 50 mcg (50 mcg Intravenous Given 04/05/23 2238)  oxyCODONE-acetaminophen (PERCOCET/ROXICET) 5-325 MG per tablet 1 tablet (has no administration in time range)  iohexol (OMNIPAQUE) 350 MG/ML  injection 75 mL (75 mLs Intravenous Contrast Given 04/05/23 1631)  sodium chloride 0.9 % bolus 1,000 mL (1,000 mLs Intravenous New Bag/Given 04/05/23 2222)  ketorolac (TORADOL) 30 MG/ML injection 15 mg (15 mg Intravenous Given 04/05/23 2224)  ondansetron (ZOFRAN) injection 4 mg (4 mg Intravenous Given 04/05/23 2250)     IMPRESSION / MDM / ASSESSMENT AND PLAN / ED COURSE  I reviewed the triage vital signs and the nursing notes.                              Differential diagnosis includes, but is not limited to, Asthma, copd, CHF, pna, ptx, malignancy, Pe, anemia   Patient presenting to the ER for evaluation of symptoms as described above.  Based on symptoms, risk factors and considered above differential, this presenting complaint could reflect a potentially life-threatening illness therefore the patient will be placed on continuous pulse oximetry and telemetry for monitoring.  Laboratory evaluation will be sent to evaluate for the above complaints.  CTA ordered out of triage does not show evidence of PE.  There is finding consistent with multilobar pneumonia.  She is not hypoxic however we will give some IV fluids as well as pain control.  Will observe in the ER as patient may require hospitalization but given her young age may also be a candidate for outpatient treatment.  Will reassess.    Clinical Course as of 04/05/23 2331  Tue Apr 05, 2023  2330 Patient reassessed.  Vital signs stable.  Heart rate improved after IV fluids.  She appears well-perfused she is tolerating p.o.  This pointedly she.  Appropriate for outpatient management she is not hypoxic or any signs of respiratory distress.  We discussed signs and symptoms which she should return to the ER. [PR]    Clinical Course User Index [PR] Willy Eddy, MD     FINAL CLINICAL IMPRESSION(S) / ED DIAGNOSES   Final diagnoses:  Influenza A  Chest pain, unspecified type     Rx / DC Orders   ED Discharge Orders           Ordered    oxyCODONE-acetaminophen (ENDOCET) 5-325 MG tablet  Every 4 hours PRN        04/05/23 2330    ondansetron (ZOFRAN-ODT) 4 MG disintegrating tablet  Every 8 hours PRN        04/05/23 2330             Note:  This document was prepared using Dragon voice recognition software and may include unintentional dictation  errors.    Willy Eddy, MD 04/05/23 [phone]

## 2023-04-06 ENCOUNTER — Telehealth: Payer: Self-pay | Admitting: Pharmacy Technician

## 2023-04-06 ENCOUNTER — Other Ambulatory Visit (HOSPITAL_COMMUNITY): Payer: Self-pay

## 2023-04-06 NOTE — Telephone Encounter (Signed)
Pharmacy Patient Advocate Encounter   Received notification from CoverMyMeds that prior authorization for RAYALDEE is required/requested.   Insurance verification completed.   The patient is insured through [employer] .   Per test claim: PA required; PA submitted to above mentioned insurance via CoverMyMeds Key/confirmation #/EOC [id number] Status is pending

## 2023-04-07 NOTE — Telephone Encounter (Signed)
Pharmacy Patient Advocate Encounter  Received notification from [facility] that Prior Authorization for RAYALDEE has been DENIED.  Full denial letter will be uploaded to the media tab. See denial reason below.   PA #/Case ID/Reference #: [id number]

## 2023-04-11 MED ORDER — VITAMIN D (ERGOCALCIFEROL) 1.25 MG (50000 UNIT) PO CAPS: 50000.0000 [IU] | ORAL_CAPSULE | Freq: Every day | ORAL | 3 refills | Status: DC

## 2023-04-11 NOTE — Telephone Encounter (Signed)
 The pt has been advised and prescription has been sent to the pharmacy

## 2023-04-15 ENCOUNTER — Other Ambulatory Visit: Payer: Self-pay | Admitting: Internal Medicine

## 2023-04-18 ENCOUNTER — Ambulatory Visit (INDEPENDENT_AMBULATORY_CARE_PROVIDER_SITE_OTHER): Payer: Medicare Other | Admitting: Nurse Practitioner

## 2023-04-18 ENCOUNTER — Encounter: Payer: Self-pay | Admitting: Nurse Practitioner

## 2023-04-18 ENCOUNTER — Other Ambulatory Visit (INDEPENDENT_AMBULATORY_CARE_PROVIDER_SITE_OTHER)

## 2023-04-18 VITALS — BP 100/70 | HR 80 | Ht 67.0 in | Wt 139.8 lb

## 2023-04-18 DIAGNOSIS — K529 Noninfective gastroenteritis and colitis, unspecified: Secondary | ICD-10-CM | POA: Diagnosis not present

## 2023-04-18 DIAGNOSIS — E559 Vitamin D deficiency, unspecified: Secondary | ICD-10-CM | POA: Diagnosis not present

## 2023-04-18 DIAGNOSIS — D509 Iron deficiency anemia, unspecified: Secondary | ICD-10-CM | POA: Diagnosis not present

## 2023-04-18 DIAGNOSIS — K2100 Gastro-esophageal reflux disease with esophagitis, without bleeding: Secondary | ICD-10-CM

## 2023-04-18 DIAGNOSIS — Z9884 Bariatric surgery status: Secondary | ICD-10-CM

## 2023-04-18 DIAGNOSIS — E538 Deficiency of other specified B group vitamins: Secondary | ICD-10-CM

## 2023-04-18 DIAGNOSIS — R7989 Other specified abnormal findings of blood chemistry: Secondary | ICD-10-CM

## 2023-04-18 LAB — CBC WITH DIFFERENTIAL/PLATELET
Basophils Absolute: 0.1 10*3/uL (ref 0.0–0.1)
Basophils Relative: 1.1 % (ref 0.0–3.0)
Eosinophils Absolute: 0.2 10*3/uL (ref 0.0–0.7)
Eosinophils Relative: 2.9 % (ref 0.0–5.0)
HCT: 40.9 % (ref 36.0–46.0)
Hemoglobin: 13.3 g/dL (ref 12.0–15.0)
Lymphocytes Relative: 25.8 % (ref 12.0–46.0)
Lymphs Abs: 1.4 10*3/uL (ref 0.7–4.0)
MCHC: 32.7 g/dL (ref 30.0–36.0)
MCV: 89.6 fl (ref 78.0–100.0)
Monocytes Absolute: 0.5 10*3/uL (ref 0.1–1.0)
Monocytes Relative: 8.9 % (ref 3.0–12.0)
Neutro Abs: 3.2 10*3/uL (ref 1.4–7.7)
Neutrophils Relative %: 61.3 % (ref 43.0–77.0)
Platelets: 604 10*3/uL — ABNORMAL HIGH (ref 150.0–400.0)
RBC: 4.56 Mil/uL (ref 3.87–5.11)
RDW: 14.5 % (ref 11.5–15.5)
WBC: 5.2 10*3/uL (ref 4.0–10.5)

## 2023-04-18 LAB — COMPREHENSIVE METABOLIC PANEL
ALT: 22 U/L (ref 0–35)
AST: 13 U/L (ref 0–37)
Albumin: 4.3 g/dL (ref 3.5–5.2)
Alkaline Phosphatase: 36 U/L — ABNORMAL LOW (ref 39–117)
BUN: 10 mg/dL (ref 6–23)
CO2: 23 meq/L (ref 19–32)
Calcium: 9.4 mg/dL (ref 8.4–10.5)
Chloride: 106 meq/L (ref 96–112)
Creatinine, Ser: 0.68 mg/dL (ref 0.40–1.20)
GFR: 105.34 mL/min (ref >60.00)
Glucose, Bld: 90 mg/dL (ref 70–99)
Potassium: 4.5 meq/L (ref 3.5–5.1)
Sodium: 139 meq/L (ref 135–145)
Total Bilirubin: 0.5 mg/dL (ref 0.2–1.2)
Total Protein: 7.1 g/dL (ref 6.0–8.3)

## 2023-04-18 LAB — C-REACTIVE PROTEIN: CRP: <1 mg/dL (ref 0.5–20.0)

## 2023-04-18 LAB — IBC + FERRITIN
Ferritin: 136.2 ng/mL (ref 10.0–291.0)
Iron: 99 ug/dL (ref 42–145)
Saturation Ratios: 25.6 % (ref 20.0–50.0)
TIBC: 386.4 ug/dL (ref 250.0–450.0)
Transferrin: 276 mg/dL (ref 212.0–360.0)

## 2023-04-18 LAB — VITAMIN D 25 HYDROXY (VIT D DEFICIENCY, FRACTURES): VITD: 12.64 ng/mL — ABNORMAL LOW (ref 30.00–100.00)

## 2023-04-18 LAB — B12 AND FOLATE PANEL
Folate: 10.9 ng/mL (ref >5.9)
Vitamin B-12: 443 pg/mL (ref 211–911)

## 2023-04-18 LAB — SEDIMENTATION RATE: Sed Rate: 26 mm/h — ABNORMAL HIGH (ref 0–20)

## 2023-04-18 MED ORDER — FAMOTIDINE 20 MG PO TABS: 20.0000 mg | ORAL_TABLET | Freq: Every day | ORAL | 1 refills | Status: DC

## 2023-04-18 NOTE — Progress Notes (Signed)
 04/18/2023 KAILEN NAME [id number] February 02, 1976   Chief Complaint: Follow up IBS, vitamin D deficiency   History of Present Illness:  Adalie Mand. Faulks is a 48 year old female with a past medical history of anxiety, arthritis, cardiomyopathy, gestational diabetes, post partum seizures, B12 deficiency, iron deficiency anemia, SBO, GERD, IBS-D. S/P Roux-en-Y 2009. Past cholecystectomy. She is known by Dr. Leonides Schanz. She continues to pass nonbloody explosive diarrhea after meals, no specific food triggers. She endorses seeing particles of undigested food ( ie: lettuce) in her diarrhea. She typically has lower abdominal cramping which precedes episodes of diarrhea and abates after defecation completed. Previously treated with Xifaxan, Flagyl and Colestipol for IBS-D without significant long term improvement. She takes Lomotil PRN, if she goes out for the day.  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 She is on Vitamin D 50,000 international units daily as prescribed by Dr. Leonides Schanz. Previously on Rayaldee daily which was recently denied by her insurance carrier so she is no longer taking it. She received IV iron infusion x 3 per hematology 02/2023.  She is s/p robotic assisted bilateral salpingo oophorectomy, lysis of adhesions and diagnostic laparoscopy by Dr. [name] on  04/01/2023.   -Intraoperative findings revealed a normal upper abdomen including bowel, diaphragmatic surfaces, stomach, and omentum. Minimal upper abdominal adhesions  -The right ovary appeared normal.  There was an approximately 2 cm hydrosalpinx wrapped around the right ovary, which was removed.  There was also a deep infiltrating right ovarian fossa endometrial implant, which required ureterolysis to remove entirely.  -The left ovary appeared normal with cysts.  No left fallopian tube noted.  She continues to have postoperative generalized  abdominal tenderness which is not severe.  She also noted her diarrhea is a yellow since her laparoscopic surgery 04/01/2023.  She developed right-sided chest pain, cough and congestion 04/05/2023.  She presented to the urgent care and tested positive for influenza A and pneumonia.  She was prescribed Tamiflu and Levaquin.  She presented to the ED later the same day for further evaluation as her symptoms worsened.  Labs in the ED showed a WBC count of 10.5.  Hemoglobin 12.9.  Platelets 297.  Sodium 137.  Potassium 3.8.  BUN 10.  Creatinine 0.69.  Calcium 8.5.  Albumin 3.2.  Total bili 0.4.  Alk phos 67.  AST 68.  ALT 111.  Lipase 18.  Troponin 3.  CTA showed marked severity posterior medial right lobe lobe infiltrate with additional mild bilateral upper lobe and right middle lobe infiltrates without evidence of PE.  She was discharged home and she endorsed completing the course of Tamiflu and Levaquin.  She denies having any GERD symptoms or dysphagia. On Pantoprazole 40mg  bid. Her most recent EGD was 09/2021 which showed reflux esophagitis, Roux-en-Y gastrojejunostomy with intact anastomosis, 2 gastric polyps and a normal examined jejunum.  Father had stomach cancer. No known family history of celiac disease or IBD.  She stated EKG that was done in the ED was abnormal.  No chest pain at this time.  PAST GI PROCEDURES:  EGD 10/13/21: - Normal esophagus. Biopsied. - Roux-en-Y gastrojejunostomy with gastrojejunal anastomosis characterized by healthy appearing mucosa. - A single gastric polyp. Resected and retrieved. - A single gastric polyp. Resected and retrieved. - Normal examined jejunum. Biopsied. Path: 1. Surgical [P], small bowel - SMALL INTESTINAL MUCOSA WITH NO SPECIFIC HISTOPATHOLOGIC CHANGES - NEGATIVE FOR INCREASED INTRAEPITHELIAL  LYMPHOCYTES OR VILLOUS ARCHITECTURAL CHANGES 2. Surgical [P], gastric - GASTRIC ANTRAL MUCOSA WITH MILD NONSPECIFIC REACTIVE GASTROPATHY - HELICOBACTER  PYLORI-LIKE ORGANISMS ARE NOT IDENTIFIED ON ROUTINE H&E STAIN 3. Surgical [P], nodular gastric mucosa - GASTRIC ANTRAL AND OXYNTIC MUCOSA WITH NONSPECIFIC HYPERPLASTIC CHANGES - NEGATIVE FOR INTESTINAL METAPLASIA OR DYSPLASIA 4. Surgical [P], esophageal - ESOPHAGEAL SQUAMOUS MUCOSA WITH VASCULAR CONGESTION, SQUAMOUS BALLOONING AND FEW INTRAEPITHELIAL EOSINOPHILS (UP TO 3/HIGH POWER FIELD), CONSISTENT WITH REFLUX ESOPHAGITIS  EGD 01/24/2018: - Normal esophagus.  - Roux-en-Y gastrojejunostomy with gastrojejunal anastomosis characterized by healthy appearing mucosa.  - Normal examined jejunum. Biopsied.  - The examination was otherwise normal  Colonoscopy 01/24/2018: - Tortuous colon.  - Normal mucosa in the entire examined colon. Biopsied.  - Non-bleeding internal hemorrhoids.  - The examination was otherwise normal JEJUNUM; COLD BIOPSY:  - ENTERIC MUCOSA WITH PRESERVED VILLOUS ARCHITECTURE AND NO SIGNIFICANT  HISTOPATHOLOGIC CHANGE.  - NEGATIVE FOR FEATURES OF CELIAC, DYSPLASIA, AND MALIGNANCY.   B. COLON, RANDOM; COLD BIOPSY:  - BENIGN COLONIC MUCOSA WITH SMALL LYMPHOID AGGREGATES, OTHERWISE NO  SIGNIFICANT HISTOPATHOLOGIC CHANGE.  - NEGATIVE FOR FEATURES OF MICROSCOPIC COLITIS, DYSPLASIA, AND  MALIGNANCY.   EGD 05/08/2013: -Normal esophagus -Normal gastric pouch -Roux-en-Y gastrojejunostomy.  The gastrojejunal anastomosis had erythema and friable mucosa. Biopsied. Normal-appearing jejunum.  Part A: JEJUNUM COLD BIOPSY: - SMALL INTESTINAL MUCOSA WITH INTACT VILLI. - NO ACTIVE INFLAMMATION, INTRAEPITHELIAL LYMPHOCYTOSIS, EROSION, OR GRANULOMAS. [name] Part B: GASTRIC POUCH COLD BIOPSY: - GASTRIC OXYNTIC MUCOSA WITH MILD REACTIVE FOVEOLAR HYPERPLASIA. - NO ACTIVE INFLAMMATION, ATROPHY, OR INTESTINAL METAPLASIA. - NO HELICOBACTER PYLORI IDENTIFIED IN HEMATOXYLIN AND EOSIN SECTIONS. . Part C: GE ANASTOMOSIS COLD BIOPSY: - SMALL INTESTINAL-TYPE MUCOSA WITH MODERATE NONSPECIFIC  CHRONIC ACTIVE INFLAMMATION.   EGD and Colonoscopy  2008 at [facility]: Unremarkable     Latest Ref Rng & Units 04/05/2023    3:40 PM 03/25/2023    8:22 AM 02/01/2023   11:52 AM  CBC  WBC 4.0 - 10.5 K/uL 10.5  5.2  5.7   Hemoglobin 12.0 - 15.0 g/dL 16.1  09.6  04.5   Hematocrit 36.0 - 46.0 % 41.0  42.2  41.1   Platelets 150 - 400 K/uL 297  347  361        Latest Ref Rng & Units 04/05/2023    3:40 PM 03/25/2023    8:22 AM 01/15/2023    2:40 PM  CMP  Glucose 70 - 99 mg/dL 409  811  74   BUN 6 - 20 mg/dL 10  12  10    Creatinine 0.44 - 1.00 mg/dL 9.14  7.82  9.56   Sodium 135 - 145 mmol/L 137  137  137   Potassium 3.5 - 5.1 mmol/L 3.8  3.3  3.4   Chloride 98 - 111 mmol/L 107  106  108   CO2 22 - 32 mmol/L 19  22  21    Calcium 8.9 - 10.3 mg/dL 8.5  9.0  8.8   Total Protein 6.5 - 8.1 g/dL 6.8   7.1   Total Bilirubin 0.0 - 1.2 mg/dL 0.4   0.5   Alkaline Phos 38 - 126 U/L 67   31   AST 15 - 41 U/L 68   24   ALT 0 - 44 U/L 111   25     Chest CTA 04/05/2023: FINDINGS: Cardiovascular: The thoracic aorta is normal in appearance. Satisfactory opacification of the pulmonary arteries to the segmental level. No evidence of pulmonary embolism. Normal heart size.  No pericardial effusion.   Mediastinum/Nodes: There is mild right hilar lymphadenopathy. Thyroid gland, trachea, and esophagus demonstrate no significant findings.   Lungs/Pleura: Marked severity posteromedial right lower lobe infiltrate is seen. Additional mild infiltrates are noted within the bilateral upper lobes and right middle lobe.   No pleural effusion or pneumothorax is identified.   Upper Abdomen: Surgical sutures are seen throughout the gastric region.   Multiple surgical clips are seen within the gallbladder fossa.   Musculoskeletal: No chest wall abnormality. No acute or significant osseous findings.   Review of the MIP images confirms the above findings.   IMPRESSION: 1. Marked severity posteromedial right  lower lobe infiltrate with additional mild bilateral upper lobe and right middle lobe infiltrates. Follow-up to resolution is recommended to exclude the presence of an underlying neoplastic process. 2. No evidence of pulmonary embolism. 3. Evidence of prior cholecystectomy.    Past Medical History:  Diagnosis Date   Abnormal ECG    Anemia    Anxiety    Arthritis    Asthma    B12 deficiency    Cardiomyopathy (HCC)    Chronic abdominal pain    Complication of anesthesia    difficulty to get sedated during endoscopy   COVID-19 01/18/2019   DDD (degenerative disc disease), lumbar    Depression    Diabetes mellitus without complication (HCC)    ONLY gestational diabetes, does not have chronic diabetes   Dysrhythmia    Family history of adverse reaction to anesthesia    brother woke during surgery   GERD (gastroesophageal reflux disease)    History of kidney stones    Iron deficiency    Migraine headache    Neuropathy    Obesity    gastric bypass 2009   Ovarian cyst    Pelvic mass    Pneumonia    within past five years   PTSD (post-traumatic stress disorder)    PTSD (post-traumatic stress disorder)    Rh negative status during pregnancy    Seizures (HCC)    post-gestational   Small bowel obstruction (HCC)    Vitamin D deficiency    Past Surgical History:  Procedure Laterality Date   ABDOMINAL HYSTERECTOMY     BILATERAL SALPINGECTOMY Bilateral 09/29/2015   Procedure: BILATERAL SALPINGECTOMY;  Surgeon: [name], MD;  Location: [facility];  Service: Gynecology;  Laterality: Bilateral;   bowel obstruction  2011   CHOLECYSTECTOMY     COLONOSCOPY WITH PROPOFOL N/A 01/24/2018   Procedure: COLONOSCOPY WITH PROPOFOL;  Surgeon: Toledo, Boykin Nearing, MD;  Location: [facility];  Service: Gastroenterology;  Laterality: N/A;   DILATION AND CURETTAGE OF UTERUS     ESOPHAGOGASTRODUODENOSCOPY     ESOPHAGOGASTRODUODENOSCOPY (EGD) WITH PROPOFOL N/A 01/24/2018   Procedure:  ESOPHAGOGASTRODUODENOSCOPY (EGD) WITH PROPOFOL;  Surgeon: Toledo, Boykin Nearing, MD;  Location: [facility];  Service: Gastroenterology;  Laterality: N/A;   GASTRIC BYPASS  2010   gi bleed  2009   Surgery-was in ICU for three weeks   HERNIA REPAIR     OVARIAN CYST REMOVAL Left 09/29/2015   Procedure: OVARIAN CYSTECTOMY;  Surgeon: [name], MD;  Location: [facility];  Service: Gynecology;  Laterality: Left;   ROBOTIC ASSISTED BILATERAL SALPINGO OOPHERECTOMY Bilateral 04/01/2023   Procedure: XI ROBOTIC ASSISTED BILATERAL SALPINGO OOPHORECTOMY,;  Surgeon: [name], MD;  Location: [facility];  Service: Gynecology;  Laterality: Bilateral;   ROBOTIC ASSISTED LAPAROSCOPIC LYSIS OF ADHESION N/A 04/01/2023   Procedure: XI ROBOTIC ASSISTED LAPAROSCOPIC LYSIS OF ADHESION;  Surgeon:  [name], MD;  Location: [facility];  Service: Gynecology;  Laterality: N/A;   ROUX-EN-Y PROCEDURE     VAGINAL HYSTERECTOMY N/A 09/29/2015   Procedure: HYSTERECTOMY VAGINAL;  Surgeon: [name], MD;  Location: [facility];  Service: Gynecology;  Laterality: N/A;   XI ROBOT ASSISTED DIAGNOSTIC LAPAROSCOPY N/A 04/01/2023   Procedure: XI ROBOT ASSISTED DIAGNOSTIC LAPAROSCOPY, POSSIBLE EXCISION OF ENDOMETRIOSIS, REMOVAL OF PELVIC MASS;  Surgeon: [name], MD;  Location: [facility];  Service: Gynecology;  Laterality: N/A;   Current Medications, Allergies, Past Medical History, Past Surgical History, Family History and Social History were reviewed in [employer] record.  Review of Systems:   Constitutional: Negative for fever, sweats, chills or weight loss.  Respiratory: Negative for shortness of breath.   Cardiovascular: Negative for chest pain, palpitations and leg swelling.  Gastrointestinal: See HPI.  Musculoskeletal: Negative for back pain or muscle aches.  Neurological: Negative for dizziness, headaches or paresthesias.   Physical Exam: LMP  (LMP Unknown)  Wt Readings from  Last 3 Encounters:  04/05/23 150 lb (68 kg)  04/01/23 142 lb (64.4 kg)  02/24/23 148 lb (67.1 kg)    General: 48 year old female in no acute distress. Head: Normocephalic and atraumatic. Eyes: No scleral icterus. Conjunctiva pink . Ears: Normal auditory acuity. Mouth: Dentition intact. No ulcers. + Geographic tongue.  Lungs: Clear throughout to auscultation. Heart: Regular rate and rhythm, no murmur. Abdomen: Soft, nondistended. Generalized tenderness throughout without rebound or guarding. No masses or hepatomegaly. Normal bowel sounds x 4 quadrants.  Laparoscopic scars intact. Rectal: Deferred.  Musculoskeletal: Symmetrical with no gross deformities. Extremities: No edema. Neurological: Alert oriented x 4. No focal deficits.  Psychological: Alert and cooperative. Normal mood and affect  Assessment and Recommendations:  47 year old female s/p Roux-en-Y bypass surgery 2009 with chronic malabsorption including vitamin D deficiency, vitamin B12 deficiency and IDA.  On vitamin D 50,000 international units once daily. -CBC, IBC + ferritin, B12, folate and vitamin D level -Reduce Pantoprazole 40mg  po every day as chronic PPI use may be contributing to malabsorption as well as chronic diarrhea  -Famotidine 20mg  one tab at bed time, to reduce risk of GERD/esophagitis flare after PPI changed to QD  IDA, secondary to Roux-en-Y surgery. Chronic PPI use may be a contributing factor. No overt GI bleeding. EGD negative for PUD or celiac disease. Colonoscopy unrevealing.  Received IV iron per hematology. -Labs as ordered above  -Consider small bowel CT vs MRI to rule out small bowel Crohn's disease  -Small bowel capsule endoscopy deferred in setting of past Roux-en-Y surgery, prior SBO and recent laparoscopic surgery including lysis of adhesions. -Continue follow-up with hematology  GERD, reflux esophagitis. EGD 09/2021 showed reflux esophagitis, Roux-en-Y gastrojejunostomy with intact anastomosis,  2 gastric polyps and a normal examined jejunum.  No GERD symptoms or dysphagia. -Pantoprazole 40 mg daily to be taken 30 minutes before breakfast -Stop p.m. dose of Pantoprazole -Start Famotidine 20 mg 1 tab p.o. nightly  Chronic diarrhea.  Chronic explosive nonbloody postprandial diarrhea.  Recently completed a 7-day course of Levaquin for pneumonia.  Diarrhea color change to yellow status post laparoscopic surgery 04/01/2023.  Normal pancreatic elastase level. -Consider small bowel imaging as noted above -Labs as ordered above, CRP and sed rate -C. difficile PCR, if negative check fecal calprotectin level if covered by insurance.  -Lomotil PRN  S/P robotic assisted bilateral salpingo oophorectomy, lysis of adhesions and diagnostic laparoscopy 04/01/2023.   Recently treated for influenza A and pneumonia -Follow  up with PCP  Elevated LFTs, in setting of influenza and pneumonia -CMP -If LFTs remain elevated, she will require further hepatology serologies and liver imaging  Abnormal EKG -Patient instructed to follow-up with PCP regarding EKG done in the ED  04/06/2023 which showed T wave inversion  in the inferior leads II, II, AVF and V4 through V6.  Recommend cardiology referral.

## 2023-04-18 NOTE — Patient Instructions (Addendum)
 Your provider has requested that you go to the basement level for lab work before leaving today. Press "B" on the elevator. The lab is located at the first door on the left as you exit the elevator.  Follow up with Dr.Dorsey in 3 months. We will contact you once our June schedule is open.  Follow up with your primary care provider regarding abnormal EKG   Reduce Pantoprazole 40 mg daily to be taken 30 minutes before breakfast.  Due to recent changes in healthcare laws, you may see the results of your imaging and laboratory studies on MyChart before your provider has had a chance to review them.  We understand that in some cases there may be results that are confusing or concerning to you. Not all laboratory results come back in the same time frame and the provider may be waiting for multiple results in order to interpret others.  Please give [place] 48 hours in order for your provider to thoroughly review all the results before contacting the office for clarification of your results.   Thank you for trusting me with your gastrointestinal care!   Alcide Evener, CRNP

## 2023-04-29 NOTE — Progress Notes (Signed)
 I agree with the assessment and plan as outlined by [name]. Okay to proceed with CT enterography to rule out small bowel Crohn's disease.

## 2023-05-02 ENCOUNTER — Other Ambulatory Visit: Payer: Self-pay | Admitting: Psychiatry

## 2023-05-02 DIAGNOSIS — F5101 Primary insomnia: Secondary | ICD-10-CM

## 2023-05-02 NOTE — Progress Notes (Signed)
 POD B triage nurse team, pls contact patient and let patient know Dr. Leonides Schanz recommended scheduling a small bowel CT enterography to further assess her small bowel, to rule out small bowel Crohn's disease. Pls schedule patient for small bowel CT enterography. THX.

## 2023-05-03 ENCOUNTER — Telehealth: Payer: Self-pay

## 2023-05-03 DIAGNOSIS — D509 Iron deficiency anemia, unspecified: Secondary | ICD-10-CM

## 2023-05-03 DIAGNOSIS — K529 Noninfective gastroenteritis and colitis, unspecified: Secondary | ICD-10-CM

## 2023-05-03 NOTE — Telephone Encounter (Signed)
 CT enterography order in epic. Secure staff message sent to radiology scheduling to contact patient to schedule appt.   Called and spoke with patient regarding additional recommendations. Patient knows to expect a call directly from radiology scheduling to set up CT enterography appt. Patient verbalized understanding and had no concerns at the end of the call.

## 2023-05-03 NOTE — Telephone Encounter (Signed)
 Author: Arnaldo Natal, NP Service: Gastroenterology Author Type: Nurse Practitioner  Filed: 05/02/2023  8:03 AM Encounter Date: 04/18/2023 Status: Signed  Editor: Arnaldo Natal, NP (Nurse Practitioner)   POD B triage nurse team, pls contact patient and let patient know Dr. Leonides Schanz recommended scheduling a small bowel CT enterography to further assess her small bowel, to rule out small bowel Crohn's disease. Pls schedule patient for small bowel CT enterography. THX.

## 2023-05-03 NOTE — Telephone Encounter (Signed)
-----   Message from Arnaldo Natal sent at 05/02/2023  8:01 AM EDT -----    ----- Message ----- From: Imogene Burn, MD Sent: 04/29/2023   5:07 PM EDT To: Arnaldo Natal, NP     ----- Message ----- From: Arnaldo Natal, NP Sent: 04/18/2023  10:44 AM EDT To: Imogene Burn, MD  Dr. Leonides Schanz, I will forward her lab results to you when done.  Please let me know if you recommend a future CT enterography versus small bowel MRI to rule out small bowel Crohn's disease in the setting of chronic malabsorption/chronic diarrhea/B12 deficiency/profound vitamin D deficiency and IDA status post Roux-en-Y bypass surgery.  No evidence of Crohn's disease per multiple EGDs/colonoscopies.  THX.

## 2023-05-04 ENCOUNTER — Other Ambulatory Visit: Payer: Self-pay | Admitting: Psychiatry

## 2023-05-04 ENCOUNTER — Encounter: Payer: Self-pay | Admitting: Obstetrics and Gynecology

## 2023-05-04 DIAGNOSIS — F410 Panic disorder [episodic paroxysmal anxiety] without agoraphobia: Secondary | ICD-10-CM

## 2023-05-05 ENCOUNTER — Other Ambulatory Visit: Payer: Self-pay | Admitting: Psychiatry

## 2023-05-05 DIAGNOSIS — F4310 Post-traumatic stress disorder, unspecified: Secondary | ICD-10-CM

## 2023-05-06 ENCOUNTER — Other Ambulatory Visit: Payer: Self-pay | Admitting: Psychiatry

## 2023-05-06 DIAGNOSIS — F4310 Post-traumatic stress disorder, unspecified: Secondary | ICD-10-CM

## 2023-05-06 NOTE — Telephone Encounter (Signed)
 05/12/23 at 9:30 am

## 2023-05-12 ENCOUNTER — Other Ambulatory Visit

## 2023-05-12 ENCOUNTER — Ambulatory Visit (HOSPITAL_COMMUNITY)
Admission: RE | Admit: 2023-05-12 | Discharge: 2023-05-12 | Disposition: A | Discharge status: Home / Self Care | Source: Ambulatory Visit | Attending: Nurse Practitioner | Admitting: Nurse Practitioner

## 2023-05-12 DIAGNOSIS — K529 Noninfective gastroenteritis and colitis, unspecified: Secondary | ICD-10-CM

## 2023-05-12 DIAGNOSIS — D509 Iron deficiency anemia, unspecified: Secondary | ICD-10-CM

## 2023-05-12 DIAGNOSIS — E559 Vitamin D deficiency, unspecified: Secondary | ICD-10-CM

## 2023-05-12 MED ORDER — IOHEXOL 300 MG/ML  SOLN
100.0000 mL | Freq: Once | INTRAMUSCULAR | Status: AC | PRN
  Administered 2023-05-12: 100 mL via INTRAVENOUS

## 2023-05-12 NOTE — Addendum Note (Signed)
 Addended by: Lamona Curl on: 05/12/2023 09:15 AM   Modules accepted: Orders

## 2023-05-13 LAB — CLOSTRIDIUM DIFFICILE TOXIN B, QUALITATIVE, REAL-TIME PCR: Toxigenic C. Difficile by PCR: NOT DETECTED

## 2023-05-16 ENCOUNTER — Ambulatory Visit: Admitting: Internal Medicine

## 2023-05-17 ENCOUNTER — Other Ambulatory Visit: Payer: Self-pay | Admitting: Internal Medicine

## 2023-05-19 ENCOUNTER — Encounter: Payer: Self-pay | Admitting: Oncology

## 2023-05-20 ENCOUNTER — Telehealth: Payer: Self-pay | Admitting: Psychiatry

## 2023-05-20 ENCOUNTER — Encounter: Payer: Self-pay | Admitting: Psychiatry

## 2023-05-20 DIAGNOSIS — F410 Panic disorder [episodic paroxysmal anxiety] without agoraphobia: Secondary | ICD-10-CM | POA: Diagnosis not present

## 2023-05-20 DIAGNOSIS — F3342 Major depressive disorder, recurrent, in full remission: Secondary | ICD-10-CM | POA: Diagnosis not present

## 2023-05-20 DIAGNOSIS — F5101 Primary insomnia: Secondary | ICD-10-CM | POA: Diagnosis not present

## 2023-05-20 DIAGNOSIS — F4310 Post-traumatic stress disorder, unspecified: Secondary | ICD-10-CM

## 2023-05-20 MED ORDER — BUSPIRONE HCL 10 MG PO TABS: 5.0000 mg | ORAL_TABLET | Freq: Every day | ORAL | 1 refills | Status: DC | PRN

## 2023-05-20 NOTE — Progress Notes (Signed)
 Virtual Visit via Video Note  I connected with [name] on 05/20/23 at  8:30 AM EDT by a video enabled telemedicine application and verified that I am speaking with the correct person using two identifiers.  Location Provider Location : [facility] Patient Location : Home  Participants: Patient , Provider   I discussed the limitations of evaluation and management by telemedicine and the availability of in person appointments. The patient expressed understanding and agreed to proceed.   I discussed the assessment and treatment plan with the patient. The patient was provided an opportunity to ask questions and all were answered. The patient agreed with the plan and demonstrated an understanding of the instructions.   The patient was advised to call back or seek an in-person evaluation if the symptoms worsen or if the condition fails to improve as anticipated.   [facility] MD OP Progress Note  05/20/2023 4:14 PM [name]  MRN:  [id number]  Chief Complaint:  Chief Complaint  Patient presents with   Anxiety   Follow-up   Depression   Medication Refill   HPI: [name] is a 48 year old Caucasian female, divorced, lives in [place], has a history of MDD, panic disorder, PTSD, primary insomnia, history of seizure disorder, chronic pain, history of gastric bypass (2009), small bowel obstruction was evaluated by telemedicine today.  The patient has been experiencing increased anxiety and panic attacks following recent surgery and pneumonia. These health events, along with past surgical experiences, have exacerbated her anxiety. She is concerned about her heart health and seeks reassurance from a medical professional. She has not been able to see a cardiologist as planned due to a rescheduled appointment, which has contributed to her anxiety. No depression or suicidal thoughts are reported, but she feels overwhelmed with anxiety and panic.  She underwent pelvic surgery on  February 14th and was discharged the same day. Shortly after, she developed pneumonia, requiring a return to the hospital. Although not admitted, she was prescribed antibiotics, which initially did not resolve the symptoms.A chest x-ray performed by her primary care provider showed persistent pneumonia, leading to another course of antibiotics. She has not yet followed up with a pulmonologist despite being advised to do so after the pneumonia.  Since her recent health issues she has been having worsening anxiety symptoms.  She has been using journaling and deep breathing techniques to manage her anxiety. She experiences episodes of panic and sweating, and her anxiety has been heightened recently.  She denies any significant depression symptoms.  She reports she is currently compliant on the Viibryd, lamotrigine, Wellbutrin and hydroxyzine as needed.  She also uses zolpidem which helps with sleep.  She denies side effects to her current medication regimen.  She is not interested in making any changes with her Viibryd to target her anxiety symptoms.  She agrees to a trial of buspirone as needed.  She has been noncompliant with psychotherapy referrals in the past.    She denies any suicidality, homicidality or perceptual disturbances.  Visit Diagnosis:    ICD-10-CM   1. MDD (major depressive disorder), recurrent, in full remission (HCC)  F33.42     2. Panic disorder  F41.0 busPIRone (BUSPAR) 10 MG tablet    3. PTSD (post-traumatic stress disorder)  F43.10     4. Primary insomnia  F51.01       Past Psychiatric History: I have reviewed past psychiatric history from progress note on 05/09/2017.  Past Medical History:  Past Medical History:  Diagnosis  Date   Abnormal ECG    Anemia    Anxiety    Arthritis    Asthma    B12 deficiency    Cardiomyopathy (HCC)    Chronic abdominal pain    Complication of anesthesia    difficulty to get sedated during endoscopy   COVID-19 01/18/2019   DDD  (degenerative disc disease), lumbar    Depression    Diabetes mellitus without complication (HCC)    ONLY gestational diabetes, does not have chronic diabetes   Dysrhythmia    Family history of adverse reaction to anesthesia    brother woke during surgery   GERD (gastroesophageal reflux disease)    History of kidney stones    Iron deficiency    Migraine headache    Neuropathy    Obesity    gastric bypass 2009   Ovarian cyst    Pelvic mass    Pneumonia    within past five years   PTSD (post-traumatic stress disorder)    PTSD (post-traumatic stress disorder)    Rh negative status during pregnancy    Seizures (HCC)    post-gestational   Small bowel obstruction (HCC)    Vitamin D deficiency     Past Surgical History:  Procedure Laterality Date   ABDOMINAL HYSTERECTOMY     BILATERAL SALPINGECTOMY Bilateral 09/29/2015   Procedure: BILATERAL SALPINGECTOMY;  Surgeon: [name], MD;  Location: [facility];  Service: Gynecology;  Laterality: Bilateral;   bowel obstruction  2011   CHOLECYSTECTOMY     COLONOSCOPY WITH PROPOFOL N/A 01/24/2018   Procedure: COLONOSCOPY WITH PROPOFOL;  Surgeon: Toledo, Boykin Nearing, MD;  Location: [facility];  Service: Gastroenterology;  Laterality: N/A;   DILATION AND CURETTAGE OF UTERUS     ESOPHAGOGASTRODUODENOSCOPY     ESOPHAGOGASTRODUODENOSCOPY (EGD) WITH PROPOFOL N/A 01/24/2018   Procedure: ESOPHAGOGASTRODUODENOSCOPY (EGD) WITH PROPOFOL;  Surgeon: Toledo, Boykin Nearing, MD;  Location: [facility];  Service: Gastroenterology;  Laterality: N/A;   GASTRIC BYPASS  2010   gi bleed  2009   Surgery-was in ICU for three weeks   HERNIA REPAIR     OVARIAN CYST REMOVAL Left 09/29/2015   Procedure: OVARIAN CYSTECTOMY;  Surgeon: [name], MD;  Location: [facility];  Service: Gynecology;  Laterality: Left;   ROBOTIC ASSISTED BILATERAL SALPINGO OOPHERECTOMY Bilateral 04/01/2023   Procedure: XI ROBOTIC ASSISTED BILATERAL SALPINGO OOPHORECTOMY,;  Surgeon:  [name], MD;  Location: [facility];  Service: Gynecology;  Laterality: Bilateral;   ROBOTIC ASSISTED LAPAROSCOPIC LYSIS OF ADHESION N/A 04/01/2023   Procedure: XI ROBOTIC ASSISTED LAPAROSCOPIC LYSIS OF ADHESION;  Surgeon: [name], MD;  Location: [facility];  Service: Gynecology;  Laterality: N/A;   ROUX-EN-Y PROCEDURE     VAGINAL HYSTERECTOMY N/A 09/29/2015   Procedure: HYSTERECTOMY VAGINAL;  Surgeon: [name], MD;  Location: [facility];  Service: Gynecology;  Laterality: N/A;   XI ROBOT ASSISTED DIAGNOSTIC LAPAROSCOPY N/A 04/01/2023   Procedure: XI ROBOT ASSISTED DIAGNOSTIC LAPAROSCOPY, POSSIBLE EXCISION OF ENDOMETRIOSIS, REMOVAL OF PELVIC MASS;  Surgeon: [name], MD;  Location: [facility];  Service: Gynecology;  Laterality: N/A;    Family Psychiatric History: I have reviewed family psychiatric history from progress note on 05/09/2017.  Family History:  Family History  Problem Relation Age of Onset   Arthritis/Rheumatoid Mother    Heart block Mother    Clotting disorder Mother    Osteoporosis Mother    Heart attack Mother    Anxiety disorder Mother    Depression Mother    Colon polyps  Mother    Stomach cancer Father 56   Heart disease Father    Heart attack Father    Depression Sister    Anxiety disorder Sister    Colon polyps Sister    Colon cancer Neg Hx    Esophageal cancer Neg Hx    Pancreatic cancer Neg Hx     Social History: I have reviewed social history from progress note on 05/09/2017. Social History   Socioeconomic History   Marital status: Single    Spouse name: Not on file   Number of children: 2   Years of education: Not on file   Highest education level: Not on file  Occupational History   Occupation: disabled  Tobacco Use   Smoking status: Never   Smokeless tobacco: Never  Vaping Use   Vaping status: Never Used  Substance and Sexual Activity   Alcohol use: No    Alcohol/week: 0.0 standard drinks of alcohol   Drug use: No    Sexual activity: Not on file  Other Topics Concern   Not on file  Social History Narrative   Lives with mother, daughter, son, and fiance. Pets, dogs, in home.   Social Drivers of Corporate investment banker Strain: Not on file  Food Insecurity: Not on file  Transportation Needs: Not on file  Physical Activity: Not on file  Stress: Not on file  Social Connections: Not on file    Allergies:  Allergies  Allergen Reactions   Amoxicillin Anaphylaxis, Hives, Shortness Of Breath, Swelling and Rash    Has patient had a PCN reaction causing immediate rash, facial/tongue/throat swelling, SOB or lightheadedness with hypotension: Yes Has patient had a PCN reaction causing severe rash involving mucus membranes or skin necrosis: Yes Has patient had a PCN reaction that required hospitalization No Has patient had a PCN reaction occurring within the last 10 years: No If all of the above answers are "NO", then may proceed with Cephalosporin use.   Penicillins Anaphylaxis, Hives, Shortness Of Breath, Swelling and Rash    Has patient had a PCN reaction causing immediate rash, facial/tongue/throat swelling, SOB or lightheadedness with hypotension: Yes Has patient had a PCN reaction causing severe rash involving mucus membranes or skin necrosis: Yes Has patient had a PCN reaction that required hospitalization No Has patient had a PCN reaction occurring within the last 10 years: No If all of the above answers are "NO", then may proceed with Cephalosporin use.   Morphine Other (See Comments)    Muscle spasms   Sulfa Antibiotics Nausea And Vomiting    Metabolic Disorder Labs: Lab Results  Component Value Date   HGBA1C 5.5 12/11/2019   MPG 111 12/11/2019   Lab Results  Component Value Date   PROLACTIN 12.1 12/11/2019   Lab Results  Component Value Date   CHOL 162 12/11/2019   TRIG 51 12/11/2019   HDL 87 12/11/2019   CHOLHDL 1.9 12/11/2019   VLDL 10 12/11/2019   LDLCALC 65 12/11/2019    Lab Results  Component Value Date   TSH 1.886 12/11/2019   TSH  05/13/2009    3.163 (NOTE)  Please note change in reference ranges for ages 22W to 40Y. Test methodology is 3rd generation TSH    Therapeutic Level Labs: No results found for: "LITHIUM" No results found for: "VALPROATE" No results found for: "CBMZ"  Current Medications: Current Outpatient Medications  Medication Sig Dispense Refill   acetaminophen (TYLENOL) 325 MG tablet Take 650 mg by mouth every 4 (  four) hours as needed for moderate pain.      buPROPion (WELLBUTRIN XL) 150 MG 24 hr tablet Take 1 tablet (150 mg total) by mouth daily. 90 tablet 1   busPIRone (BUSPAR) 10 MG tablet Take 0.5-1 tablets (5-10 mg total) by mouth daily as needed. 30 tablet 1   cyanocobalamin (VITAMIN B12) 1000 MCG/ML injection Please administer 1000 mcg injection every 3 days 10 mL 4   diphenoxylate-atropine (LOMOTIL) 2.5-0.025 MG tablet Take 1 tablet by mouth as needed. 60 tablet 0   estradiol (VIVELLE-DOT) 0.1 MG/24HR patch Place 1 patch onto the skin 2 (two) times a week.     famotidine (PEPCID) 20 MG tablet Take 1 tablet (20 mg total) by mouth at bedtime. 30 tablet 1   HYDROcodone-acetaminophen (NORCO) 7.5-325 MG tablet Take 1 tablet by mouth every 6 (six) hours as needed for moderate pain.     hydrOXYzine (ATARAX) 50 MG tablet TAKE 1 TABLET(50 MG) BY MOUTH TWICE DAILY AS NEEDED 60 tablet 2   lamoTRIgine (LAMICTAL) 200 MG tablet TAKE 1/2 TABLET(100 MG) BY MOUTH TWICE DAILY 90 tablet 1   Multiple Vitamins-Minerals (HM MULTIVITAMIN ADULT GUMMY) CHEW Chew 2 tablets by mouth daily.     pantoprazole (PROTONIX) 40 MG tablet TAKE 1 TABLET(40 MG) BY MOUTH TWICE DAILY 30 tablet 5   SUMAtriptan (IMITREX) 100 MG tablet Take 100 mg by mouth every 2 (two) hours as needed for migraine or headache.      topiramate (TOPAMAX) 50 MG tablet Take 100 mg by mouth 2 (two) times daily.     VENTOLIN HFA 108 (90 Base) MCG/ACT inhaler Inhale 2 puffs into the lungs  every 4 (four) hours as needed.     Vilazodone HCl (VIIBRYD) 40 MG TABS TAKE 1 TABLET BY MOUTH DAILY 90 tablet 1   Vitamin D, Ergocalciferol, (DRISDOL) 1.25 MG (50000 UNIT) CAPS capsule TAKE 1 CAPSULE BY MOUTH ONCE DAILY 30 capsule 3   zolpidem (AMBIEN CR) 12.5 MG CR tablet Take 1 tablet (12.5 mg total) by mouth at bedtime. 30 tablet 3   ALPRAZolam (XANAX) 0.25 MG tablet TAKE 1 TABLET BY MOUTH EVERY DAY AS NEEDED FOR UP TO 3 DAYS FOR ANXIETY (Patient not taking: Reported on 05/20/2023)     No current facility-administered medications for this visit.     Musculoskeletal: Strength & Muscle Tone:  UTA Gait & Station:  Seated Patient leans: N/A  Psychiatric Specialty Exam: Review of Systems  Psychiatric/Behavioral:  The patient is nervous/anxious.     There were no vitals taken for this visit.There is no height or weight on file to calculate BMI.  General Appearance: Casual  Eye Contact:  Fair  Speech:  Normal Rate  Volume:  Normal  Mood:  Anxious  Affect:  Congruent  Thought Process:  Goal Directed and Descriptions of Associations: Intact  Orientation:  Full (Time, Place, and Person)  Thought Content: Logical   Suicidal Thoughts:  No  Homicidal Thoughts:  No  Memory:  Immediate;   Fair Recent;   Fair Remote;   Fair  Judgement:  Fair  Insight:  Fair  Psychomotor Activity:  Normal  Concentration:  Concentration: Fair and Attention Span: Fair  Recall:  [employer] of Knowledge: Fair  Language: Fair  Akathisia:  No  Handed:  Right  AIMS (if indicated): not done  Assets:  Desire for Improvement Housing Social Support Transportation  ADL's:  Intact  Cognition: WNL  Sleep:  Fair   Screenings: AIMS    Flowsheet  Row Video Visit from 06/16/2021 in [facility]  AIMS Total Score 0      GAD-7    Flowsheet Row Office Visit from 03/24/2023 in [facility] Office Visit from 07/05/2022 in [facility] Office Visit from 05/05/2022 in [facility] Office Visit from 03/19/2022 in [facility] Office Visit from 02/25/2022 in [facility]  Total GAD-7 Score 16 8 9 3 7       PHQ2-9    Flowsheet Row Office Visit from 03/24/2023 in [facility] Video Visit from 10/06/2022 in [facility] Office Visit from 07/05/2022 in [facility] Office Visit from 05/05/2022 in [facility] Office Visit from 03/19/2022 in [facility]  PHQ-2 Total Score 1 1 4 2 2   PHQ-9 Total Score -- -- 9 8 4       Flowsheet Row Video Visit from 05/20/2023 in [facility] ED from 04[facility]05/2023 in [facility] Emergency Department at [facility] Admission (Discharged) from 04/01/2023 in [facility]  C-SSRS RISK CATEGORY No Risk No Risk No Risk        Assessment and Plan: SINIA ANTOSH is a 48 year old Caucasian female who has a history of depression, anxiety, insomnia was evaluated by telemedicine today.  Discussed assessment and plan as noted below.  Panic Disorder/PTSD-unstable Increased anxiety and panic episodes post-surgery and illness with flu and pneumonia. Anxiety exacerbated by past surgical experiences and current stressors, including health and family issues. Symptoms include fatigue, anxiety, panic, and sweating. Hydroxyzine is ineffective. Hesitant about CBT but open to medication adjustments. Buspirone discussed as an option. - Start Buspirone 5 to 10 mg daily as needed for anxiety and panic episodes. - Continue Hydroxyzine 50 mg twice a day as needed - Continue Viibryd 40 mg daily -  Encourage consideration of CBT with a therapist. - Provide a list of therapists who accept her insurance.  MDD in remission Currently denies any significant depression symptoms. - Continue Lamotrigine 200 mg daily in divided dosage - Continue Wellbutrin XL 150 mg daily-reduced dosage  Insomnia-stable Currently reports sleep is overall good. - Continue Zolpidem extended release 12.5 mg at bedtime  Follow-up Requires follow-up to monitor new medication effectiveness and overall mental health status. - Schedule follow-up appointment via video on May 20th at 8:30 AM.  Collaboration of Care: Collaboration of Care: Referral or follow-up with counselor/therapist AEB patient encouraged to establish care with therapist, provided resources.  Patient/Guardian was advised Release of Information must be obtained prior to any record release in order to collaborate their care with an outside provider. Patient/Guardian was advised if they have not already done so to contact the registration department to sign all necessary forms in order for [place] to release information regarding their care.   Consent: Patient/Guardian gives verbal consent for treatment and assignment of benefits for services provided during this visit. Patient/Guardian expressed understanding and agreed to proceed.   This note was generated in part or whole with voice recognition software. Voice recognition is usually quite accurate but there are transcription errors that can and very often do occur. I apologize for any typographical errors that were not detected and corrected.   Discussed the use of a AI scribe software for clinical note transcription with  the patient, who gave verbal consent to proceed.   Elizabeth Longs, MD 05/20/2023, 4:14 PM

## 2023-05-20 NOTE — Patient Instructions (Signed)
 [email or web address]  [email or web address]  [email or web address] [facility] [address], [place], [place] [address]   [phone]  [facility] [email or web address] [address], [place], [place] [address]  [phone]  [facility], [facility] [email or web address] [address] [place], [place] [address]  [phone]   Family solutions - [phone]  [facility] - [phone]  [facility] - [phone] counseling [phone]  [facility] [phone]   [email or web address] this clinician can offer telehealth and has a sliding scale option  [email or web address] this group also offers sliding scale rates and is based out of [place]  [facility] [employer] [facility] has interns who offer sliding scale rates and some of the full time clinicians do, as well. You complete their contact form on their website and the referrals coordinator will help to get connected to someone   [email or web address] [phone]  Medicaid below :  [facility] [address] [place], [place] [address]  [phone]    [employer] [address] [place], [place] [address]  [phone]    [facility] [address] [place], [place] [address]  [phone]     Buspirone Tablets What is this medication? BUSPIRONE (byoo SPYE rone) treats anxiety. It works by balancing the levels of dopamine and serotonin in your brain, substances that help regulate mood. This medicine may be used for other purposes; ask your health care provider or pharmacist if you have questions. COMMON BRAND NAME(S): BuSpar, Buspar Dividose What should I tell my care team before I take this  medication? They need to know if you have any of these conditions: Kidney or liver disease An unusual or allergic reaction to buspirone, other medications, foods, dyes, or preservatives Pregnant or trying to get pregnant Breast-feeding How should I use this medication? Take this medication by mouth with a glass of water. Follow the directions on the prescription label. You may take this medication with or without food. To ensure that this medication always works the same way for you, you should take it either always with or always without food. Take your doses at regular intervals. Do not take your medication more often than directed. Do not stop taking except on the advice of your care team. Talk to your care team about the use of this medication in children. Special care may be needed. Overdosage: If you think you have taken too much of this medicine contact a poison control center or emergency room at once. NOTE: This medicine is only for you. Do not share this medicine with others. What if I miss a dose? If you miss a dose, take it as soon as you can. If it is almost time for your next dose, take only that dose. Do not take double or extra doses. What may interact with this medication? Do not take this medication with any of the following: Linezolid MAOIs like Carbex, Eldepryl, Marplan, Nardil, and Parnate Methylene blue Procarbazine This medication may also interact with the following: Diazepam Digoxin Diltiazem Erythromycin Grapefruit juice Haloperidol Medications for mental depression or mood problems Medications for seizures like carbamazepine, phenobarbital and phenytoin Nefazodone Other medications for anxiety Rifampin Ritonavir Some antifungal medications like itraconazole, ketoconazole, and voriconazole Verapamil Warfarin This list may not describe all possible interactions. Give your health care provider a list of all the medicines, herbs,  non-prescription drugs, or  dietary supplements you use. Also tell them if you smoke, drink alcohol, or use illegal drugs. Some items may interact with your medicine. What should I watch for while using this medication? Visit your care team for regular checks on your progress. It may take 1 to 2 weeks before your anxiety gets better. This medication may affect your coordination, reaction time, or judgment. Do not drive or operate machinery until you know how this medication affects you. Sit up or stand slowly to reduce the risk of dizzy or fainting spells. Drinking alcohol with this medication can increase the risk of these side effects. What side effects may I notice from receiving this medication? Side effects that you should report to your care team as soon as possible: Allergic reactions--skin rash, itching, hives, swelling of the face, lips, tongue, or throat Irritability, confusion, fast or irregular heartbeat, muscle stiffness, twitching muscles, sweating, high fever, seizure, chills, vomiting, diarrhea, which may be signs of serotonin syndrome Side effects that usually do not require medical attention (report to your care team if they continue or are bothersome): Anxiety, nervousness Dizziness Drowsiness Headache Nausea Trouble sleeping This list may not describe all possible side effects. Call your doctor for medical advice about side effects. You may report side effects to FDA at 1-800-FDA-1088. Where should I keep my medication? Keep out of the reach of children. Store at room temperature below 30 degrees C (86 degrees F). Protect from light. Keep container tightly closed. Throw away any unused medication after the expiration date. NOTE: This sheet is a summary. It may not cover all possible information. If you have questions about this medicine, talk to your doctor, pharmacist, or health care provider.  2024 Elsevier/Gold Standard (2021-08-24 00:00:00)

## 2023-05-24 ENCOUNTER — Encounter: Payer: Self-pay | Admitting: Oncology

## 2023-06-14 ENCOUNTER — Other Ambulatory Visit: Payer: Self-pay | Admitting: Psychiatry

## 2023-06-14 DIAGNOSIS — F410 Panic disorder [episodic paroxysmal anxiety] without agoraphobia: Secondary | ICD-10-CM

## 2023-06-14 DIAGNOSIS — F4310 Post-traumatic stress disorder, unspecified: Secondary | ICD-10-CM

## 2023-06-21 ENCOUNTER — Institutional Professional Consult (permissible substitution): Admitting: Internal Medicine

## 2023-06-22 ENCOUNTER — Inpatient Hospital Stay: Payer: Medicare Other

## 2023-06-23 ENCOUNTER — Ambulatory Visit: Payer: Medicare Other

## 2023-06-23 ENCOUNTER — Ambulatory Visit: Payer: Medicare Other | Admitting: Oncology

## 2023-06-28 ENCOUNTER — Encounter: Payer: Self-pay | Admitting: Oncology

## 2023-07-05 ENCOUNTER — Encounter: Payer: Self-pay | Admitting: Psychiatry

## 2023-07-05 ENCOUNTER — Telehealth (INDEPENDENT_AMBULATORY_CARE_PROVIDER_SITE_OTHER): Admitting: Psychiatry

## 2023-07-05 DIAGNOSIS — F331 Major depressive disorder, recurrent, moderate: Secondary | ICD-10-CM

## 2023-07-05 DIAGNOSIS — F5101 Primary insomnia: Secondary | ICD-10-CM

## 2023-07-05 DIAGNOSIS — F410 Panic disorder [episodic paroxysmal anxiety] without agoraphobia: Secondary | ICD-10-CM

## 2023-07-05 DIAGNOSIS — F4310 Post-traumatic stress disorder, unspecified: Secondary | ICD-10-CM

## 2023-07-05 MED ORDER — BUPROPION HCL ER (XL) 150 MG PO TB24: 150.0000 mg | ORAL_TABLET | Freq: Every day | ORAL | 1 refills | Status: DC

## 2023-07-05 MED ORDER — LAMOTRIGINE 200 MG PO TABS: ORAL_TABLET | ORAL | 1 refills | Status: DC

## 2023-07-05 NOTE — Patient Instructions (Signed)
  [email or web address]  [email or web address]  [email or web address] [facility] [address], [place], [place] [address]   [phone]  [facility] [email or web address] [address], [place], [place] [address]  [phone]  [facility], [facility] [email or web address] [address], [place], [place] [address]  [phone]   Family solutions - [phone]  [facility] - [phone]  [facility] - [phone] counseling [phone]  [facility] [phone]   [email or web address] this clinician can offer telehealth and has a sliding scale option  [email or web address] this group also offers sliding scale rates and is based out of [place]  Dr. Liborio Nixon with the [facility] specializes in divorce  [facility] [employer] [facility] has interns who offer sliding scale rates and some of the full time clinicians do, as well. You complete their contact form on their website and the referrals coordinator will help to get connected to someone   [email or web address] [phone]  Medicaid below :  [facility] [address] [place], [place] [address]  [phone]    [employer] [address] [place], [place] [address]  [phone]    [facility] [address] [place], [place] [address]  [phone]

## 2023-07-05 NOTE — Progress Notes (Signed)
 Virtual Visit via Video Note  I connected with [name] on 07/05/23 at  8:30 AM EDT by a video enabled telemedicine application and verified that I am speaking with the correct person using two identifiers.  Location Provider Location : [facility] Patient Location : Car Participants: Patient , Provider    I discussed the limitations of evaluation and management by telemedicine and the availability of in person appointments. The patient expressed understanding and agreed to proceed.   I discussed the assessment and treatment plan with the patient. The patient was provided an opportunity to ask questions and all were answered. The patient agreed with the plan and demonstrated an understanding of the instructions.   The patient was advised to call back or seek an in-person evaluation if the symptoms worsen or if the condition fails to improve as anticipated.   BH MD OP Progress Note  07/05/2023 9:34 AM JERRIE SCHUSSLER  MRN:  [id number]  Chief Complaint:  Chief Complaint  Patient presents with   Follow-up   Depression   Anxiety   Insomnia   Medication Refill   Discussed the use of AI scribe software for clinical note transcription with the patient, who gave verbal consent to proceed.  History of Present Illness [name] is a 48 year old Caucasian female stay-at-home mom, divorced, lives in [place], has a history of MDD, panic disorder, PTSD, primary insomnia, history of seizure disorder, chronic pain, history of gastric bypass (2009), small bowel obstruction was evaluated by telemedicine today.  She experiences numbness in her right hand and arm, which has not yet been diagnosed as she is awaiting MRI results from [facility] in [place]. This numbness causes significant anxiety, particularly in social situations where she fears dropping items due to lack of sensation. Consequently, she avoids social gatherings, which contributes to stress and depression.  She suffers  from significant bowel issues, necessitating frequent use of Lomotil  to manage symptoms when in public. This condition has made her hesitant to leave the house due to fear of having an accident, leading her to avoid eating before going out.  Her anxiety is exacerbated by these health issues, leading to panic attacks in social situations. She feels nervous and anxious around people, fearing both bowel accidents and the numbness in her hand. This has impacted her ability to participate in her daughter Sadie's school events, which she finds distressing.  She is currently taking several medications including Viibryd , lamotrigine , Wellbutrin , and zolpidem . She usually sleeps well with zolpidem , but recently dropped the bottle in the toilet, affecting her sleep. She has not yet tried buspirone , which was prescribed as needed for anxiety, due to concerns about potential side effects.  She denies any suicidality, homicidality or perceptual disturbances.    Visit Diagnosis:    ICD-10-CM   1. MDD (major depressive disorder), recurrent episode, moderate (HCC)  F33.1 lamoTRIgine  (LAMICTAL ) 200 MG tablet    2. Panic disorder  F41.0     3. PTSD (post-traumatic stress disorder)  F43.10 buPROPion  (WELLBUTRIN  XL) 150 MG 24 hr tablet    4. Primary insomnia  F51.01       Past Psychiatric History: I have reviewed past psychiatric history from progress note on 05/09/2017.  Past Medical History:  Past Medical History:  Diagnosis Date   Abnormal ECG    Anemia    Anxiety    Arthritis    Asthma    B12 deficiency    Cardiomyopathy (HCC)    Chronic abdominal pain  Complication of anesthesia    difficulty to get sedated during endoscopy   COVID-19 01/18/2019   DDD (degenerative disc disease), lumbar    Depression    Diabetes mellitus without complication (HCC)    ONLY gestational diabetes, does not have chronic diabetes   Dysrhythmia    Family history of adverse reaction to anesthesia    brother  woke during surgery   GERD (gastroesophageal reflux disease)    History of kidney stones    Iron  deficiency    Migraine headache    Neuropathy    Obesity    gastric bypass 2009   Ovarian cyst    Pelvic mass    Pneumonia    within past five years   PTSD (post-traumatic stress disorder)    PTSD (post-traumatic stress disorder)    Rh negative status during pregnancy    Seizures (HCC)    post-gestational   Small bowel obstruction (HCC)    Vitamin D  deficiency     Past Surgical History:  Procedure Laterality Date   ABDOMINAL HYSTERECTOMY     BILATERAL SALPINGECTOMY Bilateral 09/29/2015   Procedure: BILATERAL SALPINGECTOMY;  Surgeon: Prescilla Brod, MD;  Location: [facility];  Service: Gynecology;  Laterality: Bilateral;   bowel obstruction  2011   CHOLECYSTECTOMY     COLONOSCOPY WITH PROPOFOL  N/A 01/24/2018   Procedure: COLONOSCOPY WITH PROPOFOL ;  Surgeon: Toledo, Alphonsus Jeans, MD;  Location: [facility];  Service: Gastroenterology;  Laterality: N/A;   DILATION AND CURETTAGE OF UTERUS     ESOPHAGOGASTRODUODENOSCOPY     ESOPHAGOGASTRODUODENOSCOPY (EGD) WITH PROPOFOL  N/A 01/24/2018   Procedure: ESOPHAGOGASTRODUODENOSCOPY (EGD) WITH PROPOFOL ;  Surgeon: Toledo, Alphonsus Jeans, MD;  Location: [facility];  Service: Gastroenterology;  Laterality: N/A;   GASTRIC BYPASS  2010   gi bleed  2009   Surgery-was in ICU for three weeks   HERNIA REPAIR     OVARIAN CYST REMOVAL Left 09/29/2015   Procedure: OVARIAN CYSTECTOMY;  Surgeon: Prescilla Brod, MD;  Location: [facility];  Service: Gynecology;  Laterality: Left;   ROBOTIC ASSISTED BILATERAL SALPINGO OOPHERECTOMY Bilateral 04/01/2023   Procedure: XI ROBOTIC ASSISTED BILATERAL SALPINGO OOPHORECTOMY,;  Surgeon: Prescilla Brod, MD;  Location: [facility];  Service: Gynecology;  Laterality: Bilateral;   ROBOTIC ASSISTED LAPAROSCOPIC LYSIS OF ADHESION N/A 04/01/2023   Procedure: XI ROBOTIC ASSISTED LAPAROSCOPIC LYSIS OF ADHESION;  Surgeon: Prescilla Brod, MD;  Location: [facility];  Service: Gynecology;  Laterality: N/A;   ROUX-EN-Y PROCEDURE     VAGINAL HYSTERECTOMY N/A 09/29/2015   Procedure: HYSTERECTOMY VAGINAL;  Surgeon: Prescilla Brod, MD;  Location: [facility];  Service: Gynecology;  Laterality: N/A;   XI ROBOT ASSISTED DIAGNOSTIC LAPAROSCOPY N/A 04/01/2023   Procedure: XI ROBOT ASSISTED DIAGNOSTIC LAPAROSCOPY, POSSIBLE EXCISION OF ENDOMETRIOSIS, REMOVAL OF PELVIC MASS;  Surgeon: Prescilla Brod, MD;  Location: [facility];  Service: Gynecology;  Laterality: N/A;    Family Psychiatric History: I have reviewed family psychiatric history from progress note on 05/09/2017.  Family History:  Family History  Problem Relation Age of Onset   Arthritis/Rheumatoid Mother    Heart block Mother    Clotting disorder Mother    Osteoporosis Mother    Heart attack Mother    Anxiety disorder Mother    Depression Mother    Colon polyps Mother    Stomach cancer Father 8   Heart disease Father    Heart attack Father    Depression Sister    Anxiety disorder Sister    Colon polyps Sister    Colon  cancer Neg Hx    Esophageal cancer Neg Hx    Pancreatic cancer Neg Hx     Social History: I have reviewed social history from progress note on 05/09/2017. Social History   Socioeconomic History   Marital status: Single    Spouse name: Not on file   Number of children: 2   Years of education: Not on file   Highest education level: Not on file  Occupational History   Occupation: disabled  Tobacco Use   Smoking status: Never   Smokeless tobacco: Never  Vaping Use   Vaping status: Never Used  Substance and Sexual Activity   Alcohol use: No    Alcohol/week: 0.0 standard drinks of alcohol   Drug use: No   Sexual activity: Not on file  Other Topics Concern   Not on file  Social History Narrative   Lives with mother, daughter, son, and fiance. Pets, dogs, in home.   Social Drivers of Corporate investment banker Strain: Not on file   Food Insecurity: Not on file  Transportation Needs: Not on file  Physical Activity: Not on file  Stress: Not on file  Social Connections: Not on file    Allergies:  Allergies  Allergen Reactions   Amoxicillin Anaphylaxis, Hives, Shortness Of Breath, Swelling and Rash    Has patient had a PCN reaction causing immediate rash, facial/tongue/throat swelling, SOB or lightheadedness with hypotension: Yes Has patient had a PCN reaction causing severe rash involving mucus membranes or skin necrosis: Yes Has patient had a PCN reaction that required hospitalization No Has patient had a PCN reaction occurring within the last 10 years: No If all of the above answers are "NO", then may proceed with Cephalosporin use.   Penicillins Anaphylaxis, Hives, Shortness Of Breath, Swelling and Rash    Has patient had a PCN reaction causing immediate rash, facial/tongue/throat swelling, SOB or lightheadedness with hypotension: Yes Has patient had a PCN reaction causing severe rash involving mucus membranes or skin necrosis: Yes Has patient had a PCN reaction that required hospitalization No Has patient had a PCN reaction occurring within the last 10 years: No If all of the above answers are "NO", then may proceed with Cephalosporin use.   Morphine Other (See Comments)    Muscle spasms   Sulfa Antibiotics Nausea And Vomiting    Metabolic Disorder Labs: Lab Results  Component Value Date   HGBA1C 5.5 12/11/2019   MPG 111 12/11/2019   Lab Results  Component Value Date   PROLACTIN 12.1 12/11/2019   Lab Results  Component Value Date   CHOL 162 12/11/2019   TRIG 51 12/11/2019   HDL 87 12/11/2019   CHOLHDL 1.9 12/11/2019   VLDL 10 12/11/2019   LDLCALC 65 12/11/2019   Lab Results  Component Value Date   TSH 1.886 12/11/2019   TSH  05/13/2009    3.163 (NOTE) Please note change in reference ranges for ages 18W to 47Y. Test methodology is 3rd generation TSH    Therapeutic Level Labs: No results  found for: "LITHIUM" No results found for: "VALPROATE" No results found for: "CBMZ"  Current Medications: Current Outpatient Medications  Medication Sig Dispense Refill   acetaminophen  (TYLENOL ) 325 MG tablet Take 650 mg by mouth every 4 (four) hours as needed for moderate pain.      ALPRAZolam  (XANAX ) 0.25 MG tablet TAKE 1 TABLET BY MOUTH EVERY DAY AS NEEDED FOR UP TO 3 DAYS FOR ANXIETY (Patient not taking: Reported on 05/20/2023)  buPROPion  (WELLBUTRIN  XL) 150 MG 24 hr tablet Take 1 tablet (150 mg total) by mouth daily. 90 tablet 1   busPIRone  (BUSPAR ) 10 MG tablet Take 0.5-1 tablets (5-10 mg total) by mouth daily as needed. 30 tablet 1   cyanocobalamin  (VITAMIN B12) 1000 MCG/ML injection Please administer 1000 mcg injection every 3 days 10 mL 4   diphenoxylate -atropine  (LOMOTIL ) 2.5-0.025 MG tablet Take 1 tablet by mouth as needed. 60 tablet 0   estradiol (VIVELLE-DOT) 0.1 MG/24HR patch Place 1 patch onto the skin 2 (two) times a week.     famotidine  (PEPCID ) 20 MG tablet Take 1 tablet (20 mg total) by mouth at bedtime. 30 tablet 1   HYDROcodone-acetaminophen  (NORCO) 7.5-325 MG tablet Take 1 tablet by mouth every 6 (six) hours as needed for moderate pain.     hydrOXYzine  (ATARAX ) 50 MG tablet TAKE 1 TABLET(50 MG) BY MOUTH TWICE DAILY AS NEEDED 60 tablet 2   lamoTRIgine  (LAMICTAL ) 200 MG tablet TAKE 1/2 TABLET(100 MG) BY MOUTH TWICE DAILY 90 tablet 1   Multiple Vitamins-Minerals (HM MULTIVITAMIN ADULT GUMMY) CHEW Chew 2 tablets by mouth daily.     pantoprazole  (PROTONIX ) 40 MG tablet TAKE 1 TABLET(40 MG) BY MOUTH TWICE DAILY 30 tablet 5   SUMAtriptan  (IMITREX ) 100 MG tablet Take 100 mg by mouth every 2 (two) hours as needed for migraine or headache.      topiramate  (TOPAMAX ) 50 MG tablet Take 100 mg by mouth 2 (two) times daily.     VENTOLIN HFA 108 (90 Base) MCG/ACT inhaler Inhale 2 puffs into the lungs every 4 (four) hours as needed.     Vilazodone  HCl (VIIBRYD ) 40 MG TABS TAKE 1 TABLET  BY MOUTH DAILY 90 tablet 3   Vitamin D , Ergocalciferol , (DRISDOL ) 1.25 MG (50000 UNIT) CAPS capsule TAKE 1 CAPSULE BY MOUTH ONCE DAILY 30 capsule 3   zolpidem  (AMBIEN  CR) 12.5 MG CR tablet Take 1 tablet (12.5 mg total) by mouth at bedtime. 30 tablet 3   No current facility-administered medications for this visit.     Musculoskeletal: Strength & Muscle Tone: UTA Gait & Station: Seated Patient leans: N/A  Psychiatric Specialty Exam: Review of Systems  Psychiatric/Behavioral:  Positive for dysphoric mood and sleep disturbance. The patient is nervous/anxious.     There were no vitals taken for this visit.There is no height or weight on file to calculate BMI.  General Appearance: Casual  Eye Contact:  Fair  Speech:  Clear and Coherent  Volume:  Normal  Mood:  Anxious and Depressed due to current physical complaints/health issues  Affect:  Congruent  Thought Process:  Goal Directed and Descriptions of Associations: Intact  Orientation:  Full (Time, Place, and Person)  Thought Content: Logical   Suicidal Thoughts:  No  Homicidal Thoughts:  No  Memory:  Immediate;   Fair Recent;   Fair Remote;   Fair  Judgement:  Fair  Insight:  Fair  Psychomotor Activity:  Normal  Concentration:  Concentration: Fair and Attention Span: Fair  Recall:  [employer] of Knowledge: Fair  Language: Fair  Akathisia:  No  Handed:  Right  AIMS (if indicated): not done  Assets:  Communication Skills Desire for Improvement Housing Social Support Talents/Skills Transportation  ADL's:  Intact  Cognition: WNL  Sleep:  Fair when taking medications   Screenings: AIMS    Flowsheet Row Video Visit from 06/16/2021 in [facility]  AIMS Total Score 0      GAD-7  Flowsheet Row Office Visit from 03/24/2023 in [facility] Office Visit from 07/05/2022 in [facility] Office Visit from  05/05/2022 in [facility] Office Visit from 03/19/2022 in [facility] Office Visit from 02/25/2022 in [facility]  Total GAD-7 Score 16 8 9 3 7       PHQ2-9    Flowsheet Row Office Visit from 03/24/2023 in [facility] Video Visit from 10/06/2022 in [facility] Office Visit from 07/05/2022 in [facility] Office Visit from 05/05/2022 in [facility] Office Visit from 03/19/2022 in [facility]  PHQ-2 Total Score 1 1 4 2 2   PHQ-9 Total Score -- -- 9 8 4       Flowsheet Row Video Visit from 07/05/2023 in [facility] Video Visit from 05/20/2023 in [facility] ED from 04/05/2023 in [facility] at [facility]  C-SSRS RISK CATEGORY No Risk No Risk No Risk        Assessment and Plan: HAIVEN NARDONE is a 48 year old Caucasian female who has a history of anxiety, insomnia was evaluated by telemedicine today.  Discussed assessment and plan as noted below.  Panic disorder/PTSD-unstable Current anxiety mostly because of her pain and other physical complaints.  She is not interested in psychotherapy sessions and has been noncompliant with recommendations.  She also has been noncompliant with BuSpar  which was prescribed as needed. - Encouraged compliance with psychotherapy. - Encouraged compliance with medications like BuSpar  5 to 10 mg daily as needed - Continue Hydroxyzine  50 mg twice a day as needed - Continue Viibryd  40 mg daily   MDD-unstable Current depression symptoms related to situational stressors physical complaints. - Encouraged to establish care with the psychotherapist to  start CBT - Discussed referral to DBT skill groups, in-house. - Continue Viibryd  40 mg daily - Continue Lamictal  200 mg daily - Continue Wellbutrin  XL 150 mg daily -reduced dosage  Insomnia-unstable Sleep problems currently due to not taking zolpidem  however when she does take zolpidem  she is able to sleep well. - Continue sufficient pain management. - Continue Zolpidem  extended release 12.5 mg at bedtime - Reviewed [place] PMP AWARxE   Follow-up Follow-up in clinic in 3 months or sooner if needed.   Collaboration of Care: Collaboration of Care: Patient refused AEB patient declines referral for DBT/CBT.  Provided community resources.  Patient/Guardian was advised Release of Information must be obtained prior to any record release in order to collaborate their care with an outside provider. Patient/Guardian was advised if they have not already done so to contact the registration department to sign all necessary forms in order for us  to release information regarding their care.   Consent: Patient/Guardian gives verbal consent for treatment and assignment of benefits for services provided during this visit. Patient/Guardian expressed understanding and agreed to proceed.   This note was generated in part or whole with voice recognition software. Voice recognition is usually quite accurate but there are transcription errors that can and very often do occur. I apologize for any typographical errors that were not detected and corrected.   Adlyn Fife, MD 07/05/2023, 9:34 AM

## 2023-07-10 ENCOUNTER — Other Ambulatory Visit: Payer: Self-pay | Admitting: Nurse Practitioner

## 2023-07-15 ENCOUNTER — Other Ambulatory Visit: Payer: Self-pay | Admitting: *Deleted

## 2023-07-15 DIAGNOSIS — D508 Other iron deficiency anemias: Secondary | ICD-10-CM

## 2023-07-18 ENCOUNTER — Inpatient Hospital Stay: Attending: Oncology

## 2023-07-18 DIAGNOSIS — D508 Other iron deficiency anemias: Secondary | ICD-10-CM

## 2023-07-18 DIAGNOSIS — D509 Iron deficiency anemia, unspecified: Secondary | ICD-10-CM | POA: Insufficient documentation

## 2023-07-18 DIAGNOSIS — F32A Depression, unspecified: Secondary | ICD-10-CM | POA: Insufficient documentation

## 2023-07-18 DIAGNOSIS — M069 Rheumatoid arthritis, unspecified: Secondary | ICD-10-CM | POA: Diagnosis not present

## 2023-07-18 DIAGNOSIS — F419 Anxiety disorder, unspecified: Secondary | ICD-10-CM | POA: Insufficient documentation

## 2023-07-18 LAB — IRON AND TIBC
Iron: 132 ug/dL (ref 28–170)
Saturation Ratios: 32 % — ABNORMAL HIGH (ref 10.4–31.8)
TIBC: 407 ug/dL (ref 250–450)
UIBC: 275 ug/dL

## 2023-07-18 LAB — CBC WITH DIFFERENTIAL/PLATELET
Abs Immature Granulocytes: 0.02 10*3/uL (ref 0.00–0.07)
Basophils Absolute: 0 10*3/uL (ref 0.0–0.1)
Basophils Relative: 1 %
Eosinophils Absolute: 0.1 10*3/uL (ref 0.0–0.5)
Eosinophils Relative: 2 %
HCT: 42.3 % (ref 36.0–46.0)
Hemoglobin: 13.2 g/dL (ref 12.0–15.0)
Immature Granulocytes: 0 %
Lymphocytes Relative: 26 %
Lymphs Abs: 1.4 10*3/uL (ref 0.7–4.0)
MCH: 29.3 pg (ref 26.0–34.0)
MCHC: 31.2 g/dL (ref 30.0–36.0)
MCV: 93.8 fL (ref 80.0–100.0)
Monocytes Absolute: 0.3 10*3/uL (ref 0.1–1.0)
Monocytes Relative: 6 %
Neutro Abs: 3.3 10*3/uL (ref 1.7–7.7)
Neutrophils Relative %: 65 %
Platelets: 313 10*3/uL (ref 150–400)
RBC: 4.51 MIL/uL (ref 3.87–5.11)
RDW: 13.7 % (ref 11.5–15.5)
WBC: 5.1 10*3/uL (ref 4.0–10.5)
nRBC: 0 % (ref 0.0–0.2)

## 2023-07-18 LAB — FERRITIN: Ferritin: 22 ng/mL (ref 11–307)

## 2023-07-20 ENCOUNTER — Inpatient Hospital Stay

## 2023-07-20 ENCOUNTER — Inpatient Hospital Stay: Admitting: Oncology

## 2023-07-25 ENCOUNTER — Ambulatory Visit: Admitting: Cardiology

## 2023-07-25 ENCOUNTER — Institutional Professional Consult (permissible substitution): Admitting: Internal Medicine

## 2023-07-25 ENCOUNTER — Inpatient Hospital Stay

## 2023-07-25 ENCOUNTER — Encounter: Payer: Self-pay | Admitting: Oncology

## 2023-07-25 ENCOUNTER — Inpatient Hospital Stay (HOSPITAL_BASED_OUTPATIENT_CLINIC_OR_DEPARTMENT_OTHER): Admitting: Oncology

## 2023-07-25 VITALS — BP 110/69 | HR 77 | Temp 96.5°F | Resp 16 | Ht 67.0 in | Wt 145.9 lb

## 2023-07-25 DIAGNOSIS — D509 Iron deficiency anemia, unspecified: Secondary | ICD-10-CM | POA: Diagnosis not present

## 2023-07-25 NOTE — Progress Notes (Signed)
  [facility]  [phone]) [phone] [phone]) [phone]  ID: [name] OB: 10/18/1975  MR#: [id number]  AOZ#:[id number]  Patient Care Team: Candiss Chamorro, MD as PCP - General (Family Medicine) Shellie Dials, MD as Consulting Physician (Oncology)  CHIEF COMPLAINT: Iron  deficiency anemia.   INTERVAL HISTORY: Patient returns to clinic today for repeat laboratory work, further evaluation, and consideration of additional Venofer .  She continues to have chronic fatigue but attributes part of this to taking care of her father who is undergoing treatment for colon cancer in [facility].  She also recently had surgery for endometriosis and had an extended recovery time.  She has no neurologic complaints.  She denies any recent fevers or illnesses.  She has a good appetite and denies weight loss.  She has no chest pain, shortness of breath, cough, or hemoptysis.  She denies any nausea, vomiting, constipation, or diarrhea.  She has no melena or hematochezia.  She has no urinary complaints.  Patient offers no further specific complaints today.  REVIEW OF SYSTEMS:   Review of Systems  Constitutional:  Positive for malaise/fatigue. Negative for fever and weight loss.  Respiratory: Negative.  Negative for cough and shortness of breath.   Cardiovascular: Negative.  Negative for chest pain and leg swelling.  Gastrointestinal: Negative.  Negative for abdominal pain, blood in stool and melena.  Genitourinary: Negative.  Negative for hematuria.  Musculoskeletal: Negative.  Negative for back pain, joint pain and neck pain.  Skin: Negative.  Negative for rash.  Neurological:  Positive for weakness. Negative for dizziness.  Psychiatric/Behavioral: Negative.  Negative for depression. The patient is not nervous/anxious.     As per HPI. Otherwise, a complete review of systems is negative.  PAST MEDICAL HISTORY: Past Medical History:  Diagnosis Date   Abnormal ECG     Anemia    Anxiety    Arthritis    Asthma    B12 deficiency    Cardiomyopathy (HCC)    Chronic abdominal pain    Complication of anesthesia    difficulty to get sedated during endoscopy   COVID-19 01/18/2019   DDD (degenerative disc disease), lumbar    Depression    Diabetes mellitus without complication (HCC)    ONLY gestational diabetes, does not have chronic diabetes   Dysrhythmia    Family history of adverse reaction to anesthesia    brother woke during surgery   GERD (gastroesophageal reflux disease)    History of kidney stones    Iron  deficiency    Migraine headache    Neuropathy    Obesity    gastric bypass 2009   Ovarian cyst    Pelvic mass    Pneumonia    within past five years   PTSD (post-traumatic stress disorder)    PTSD (post-traumatic stress disorder)    Rh negative status during pregnancy    Seizures (HCC)    post-gestational   Small bowel obstruction (HCC)    Vitamin D  deficiency     PAST SURGICAL HISTORY: Past Surgical History:  Procedure Laterality Date   ABDOMINAL HYSTERECTOMY     BILATERAL SALPINGECTOMY Bilateral 09/29/2015   Procedure: BILATERAL SALPINGECTOMY;  Surgeon: Prescilla Brod, MD;  Location: [facility] OR[facility];  Service: Gynecology;  Laterality: Bilateral;   bowel obstruction  2011   CHOLECYSTECTOMY     COLONOSCOPY WITH PROPOFOL  N/A 01/24/2018   Procedure: COLONOSCOPY WITH PROPOFOL ;  Surgeon: Toledo, Alphonsus Jeans, MD;  Location: [facility] END[facility];  Service: Gastroenterology;  Laterality: N/A;  DILATION AND CURETTAGE OF UTERUS     ESOPHAGOGASTRODUODENOSCOPY     ESOPHAGOGASTRODUODENOSCOPY (EGD) WITH PROPOFOL  N/A 01/24/2018   Procedure: ESOPHAGOGASTRODUODENOSCOPY (EGD) WITH PROPOFOL ;  Surgeon: Toledo, Alphonsus Jeans, MD;  Location: [facility];  Service: Gastroenterology;  Laterality: N/A;   GASTRIC BYPASS  2010   gi bleed  2009   Surgery-was in ICU for three weeks   HERNIA REPAIR     OVARIAN CYST REMOVAL Left 09/29/2015   Procedure: OVARIAN  CYSTECTOMY;  Surgeon: Prescilla Brod, MD;  Location: [facility];  Service: Gynecology;  Laterality: Left;   ROBOTIC ASSISTED BILATERAL SALPINGO OOPHERECTOMY Bilateral 04/01/2023   Procedure: XI ROBOTIC ASSISTED BILATERAL SALPINGO OOPHORECTOMY,;  Surgeon: Prescilla Brod, MD;  Location: [facility];  Service: Gynecology;  Laterality: Bilateral;   ROBOTIC ASSISTED LAPAROSCOPIC LYSIS OF ADHESION N/A 04/01/2023   Procedure: XI ROBOTIC ASSISTED LAPAROSCOPIC LYSIS OF ADHESION;  Surgeon: Prescilla Brod, MD;  Location: [facility];  Service: Gynecology;  Laterality: N/A;   ROUX-EN-Y PROCEDURE     VAGINAL HYSTERECTOMY N/A 09/29/2015   Procedure: HYSTERECTOMY VAGINAL;  Surgeon: Prescilla Brod, MD;  Location: [facility];  Service: Gynecology;  Laterality: N/A;   XI ROBOT ASSISTED DIAGNOSTIC LAPAROSCOPY N/A 04/01/2023   Procedure: XI ROBOT ASSISTED DIAGNOSTIC LAPAROSCOPY, POSSIBLE EXCISION OF ENDOMETRIOSIS, REMOVAL OF PELVIC MASS;  Surgeon: Prescilla Brod, MD;  Location: [facility];  Service: Gynecology;  Laterality: N/A;    FAMILY HISTORY Family History  Problem Relation Age of Onset   Arthritis/Rheumatoid Mother    Heart block Mother    Clotting disorder Mother    Osteoporosis Mother    Heart attack Mother    Anxiety disorder Mother    Depression Mother    Colon polyps Mother    Stomach cancer Father 53   Heart disease Father    Heart attack Father    Depression Sister    Anxiety disorder Sister    Colon polyps Sister    Colon cancer Neg Hx    Esophageal cancer Neg Hx    Pancreatic cancer Neg Hx        ADVANCED DIRECTIVES:    HEALTH MAINTENANCE: Social History   Tobacco Use   Smoking status: Never   Smokeless tobacco: Never  Vaping Use   Vaping status: Never Used  Substance Use Topics   Alcohol use: No    Alcohol/week: 0.0 standard drinks of alcohol   Drug use: No     Colonoscopy:  PAP:  Bone density:  Lipid panel:  Allergies  Allergen Reactions   Amoxicillin Anaphylaxis,  Hives, Shortness Of Breath, Swelling and Rash    Has patient had a PCN reaction causing immediate rash, facial/tongue/throat swelling, SOB or lightheadedness with hypotension: Yes Has patient had a PCN reaction causing severe rash involving mucus membranes or skin necrosis: Yes Has patient had a PCN reaction that required hospitalization No Has patient had a PCN reaction occurring within the last 10 years: No If all of the above answers are "NO", then may proceed with Cephalosporin use.   Penicillins Anaphylaxis, Hives, Shortness Of Breath, Swelling and Rash    Has patient had a PCN reaction causing immediate rash, facial/tongue/throat swelling, SOB or lightheadedness with hypotension: Yes Has patient had a PCN reaction causing severe rash involving mucus membranes or skin necrosis: Yes Has patient had a PCN reaction that required hospitalization No Has patient had a PCN reaction occurring within the last 10 years: No If all of the above answers are "NO", then may proceed with Cephalosporin use.  Morphine Other (See Comments)    Muscle spasms   Sulfa Antibiotics Nausea And Vomiting    Current Outpatient Medications  Medication Sig Dispense Refill   acetaminophen  (TYLENOL ) 325 MG tablet Take 650 mg by mouth every 4 (four) hours as needed for moderate pain.      buPROPion  (WELLBUTRIN  XL) 150 MG 24 hr tablet Take 1 tablet (150 mg total) by mouth daily. 90 tablet 1   busPIRone  (BUSPAR ) 10 MG tablet Take 0.5-1 tablets (5-10 mg total) by mouth daily as needed. 30 tablet 1   cyanocobalamin  (VITAMIN B12) 1000 MCG/ML injection Please administer 1000 mcg injection every 3 days 10 mL 4   diphenoxylate -atropine  (LOMOTIL ) 2.5-0.025 MG tablet Take 1 tablet by mouth as needed. 60 tablet 0   estradiol (VIVELLE-DOT) 0.1 MG/24HR patch Place 1 patch onto the skin 2 (two) times a week.     famotidine  (PEPCID ) 20 MG tablet TAKE 1 TABLET BY MOUTH AT BEDTIME 30 tablet 0   HYDROcodone-acetaminophen  (NORCO)  7.5-325 MG tablet Take 1 tablet by mouth every 6 (six) hours as needed for moderate pain.     hydrOXYzine  (ATARAX ) 50 MG tablet TAKE 1 TABLET(50 MG) BY MOUTH TWICE DAILY AS NEEDED 60 tablet 2   lamoTRIgine  (LAMICTAL ) 200 MG tablet TAKE 1/2 TABLET(100 MG) BY MOUTH TWICE DAILY 90 tablet 1   Multiple Vitamins-Minerals (HM MULTIVITAMIN ADULT GUMMY) CHEW Chew 2 tablets by mouth daily.     pantoprazole  (PROTONIX ) 40 MG tablet TAKE 1 TABLET(40 MG) BY MOUTH TWICE DAILY 30 tablet 5   SUMAtriptan  (IMITREX ) 100 MG tablet Take 100 mg by mouth every 2 (two) hours as needed for migraine or headache.      topiramate  (TOPAMAX ) 50 MG tablet Take 100 mg by mouth 2 (two) times daily.     VENTOLIN HFA 108 (90 Base) MCG/ACT inhaler Inhale 2 puffs into the lungs every 4 (four) hours as needed.     Vilazodone  HCl (VIIBRYD ) 40 MG TABS TAKE 1 TABLET BY MOUTH DAILY 90 tablet 3   Vitamin D , Ergocalciferol , (DRISDOL ) 1.25 MG (50000 UNIT) CAPS capsule TAKE 1 CAPSULE BY MOUTH ONCE DAILY 30 capsule 3   zolpidem  (AMBIEN  CR) 12.5 MG CR tablet Take 1 tablet (12.5 mg total) by mouth at bedtime. 30 tablet 3   ALPRAZolam  (XANAX ) 0.25 MG tablet TAKE 1 TABLET BY MOUTH EVERY DAY AS NEEDED FOR UP TO 3 DAYS FOR ANXIETY (Patient not taking: Reported on 07/25/2023)     No current facility-administered medications for this visit.    OBJECTIVE: Vitals:   07/25/23 0938  BP: 110/69  Pulse: 77  Resp: 16  Temp: (!) 96.5 F (35.8 C)  SpO2: 100%     Body mass index is 22.85 kg/m.    ECOG FS:0 - Asymptomatic  General: Well-developed, well-nourished, no acute distress. Eyes: Pink conjunctiva, anicteric sclera. HEENT: Normocephalic, moist mucous membranes. Lungs: No audible wheezing or coughing. Heart: Regular rate and rhythm. Abdomen: Soft, nontender, no obvious distention. Musculoskeletal: No edema, cyanosis, or clubbing. Neuro: Alert, answering all questions appropriately. Cranial nerves grossly intact. Skin: No rashes or petechiae  noted. Psych: Normal affect.  LAB RESULTS:  Lab Results  Component Value Date   NA 139 04/18/2023   K 4.5 04/18/2023   CL 106 04/18/2023   CO2 23 04/18/2023   GLUCOSE 90 04/18/2023   BUN 10 04/18/2023   CREATININE 0.68 04/18/2023   CALCIUM 9.4 04/18/2023   PROT 7.1 04/18/2023   ALBUMIN 4.3 04/18/2023   AST 13  04/18/2023   ALT 22 04/18/2023   ALKPHOS 36 (L) 04/18/2023   BILITOT 0.5 04/18/2023   GFRNONAA >60 04/05/2023   GFRAA >60 04/26/2018    Lab Results  Component Value Date   WBC 5.1 07/18/2023   NEUTROABS 3.3 07/18/2023   HGB 13.2 07/18/2023   HCT 42.3 07/18/2023   MCV 93.8 07/18/2023   PLT 313 07/18/2023   Lab Results  Component Value Date   FERRITIN 22 07/18/2023   Lab Results  Component Value Date   IRON  132 07/18/2023   TIBC 407 07/18/2023   IRONPCTSAT 32 (H) 07/18/2023      STUDIES: No results found.   ASSESSMENT: Iron  deficiency anemia.  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  Patient's hemoglobin and iron  stores are now within normal limits.  She does not require additional IV Venofer .  Patient last received treatment on March 03, 2023.  No intervention is needed.  Return to clinic in 3 months with repeat laboratory work and video-assisted telemedicine visit.   Rheumatoid arthritis: Continued workup to rheumatology. Depression/anxiety: Continue follow-up with primary care as scheduled.  I spent a total of 20 minutes reviewing chart data, face-to-face evaluation with the patient, counseling and coordination of care as detailed above.   Patient expressed understanding and was in agreement with this plan. She also understands that She can call clinic at any time with any questions, concerns, or complaints.     Shellie Dials, MD   07/25/2023 10:04 AM

## 2023-07-29 ENCOUNTER — Ambulatory Visit: Admitting: Internal Medicine

## 2023-07-31 ENCOUNTER — Other Ambulatory Visit: Payer: Self-pay | Admitting: Psychiatry

## 2023-07-31 DIAGNOSIS — F410 Panic disorder [episodic paroxysmal anxiety] without agoraphobia: Secondary | ICD-10-CM

## 2023-08-08 ENCOUNTER — Encounter: Payer: Self-pay | Admitting: Cardiology

## 2023-08-08 ENCOUNTER — Ambulatory Visit: Attending: Cardiology | Admitting: Cardiology

## 2023-08-08 ENCOUNTER — Ambulatory Visit

## 2023-08-08 VITALS — BP 110/66 | HR 77 | Ht 67.0 in | Wt 150.0 lb

## 2023-08-08 DIAGNOSIS — R002 Palpitations: Secondary | ICD-10-CM | POA: Insufficient documentation

## 2023-08-08 DIAGNOSIS — R079 Chest pain, unspecified: Secondary | ICD-10-CM | POA: Insufficient documentation

## 2023-08-08 NOTE — Progress Notes (Signed)
  Electrophysiology Office Note:   Date:  08/08/2023  ID:  [name], DOB [birth year], MRN [id number]  Primary Cardiologist: None Primary Heart Failure: None Electrophysiologist: None      History of Present Illness:   [name] is a 48 y.o. female with h/o B12 deficiency, iron  deficiency anemia seen today for  for Electrophysiology evaluation of chest pain, palpitations, shortness of breath at the request of Tanda Eke.    Today, denies symptoms of lower extremity edema, claudication, dizziness, presyncope, syncope, bleeding, or neurologic sequela. The patient is tolerating medications without difficulties.  She has a longstanding history of chest pain and palpitations.  She started feeling poorly a few months ago.  She is able to do her daily activities, but does have discomfort in her left shoulder that radiates down into her arm.  She had a shoulder MRI as well as x-rays of her spine which were unrevealing.  Episodes are not related to exertion.  They can last for a few hours at a time.  They occur multiple times a week.  They are not improved with arm movements, and she cannot palpate pain.  She also has intermittent palpitations throughout the week that last for minutes at a time.  She does have some shortness of breath and fatigue.  She has had a Roux-en-Y gastric bypass, and has had issues with malabsorption.  Review of systems complete and found to be negative unless listed in HPI.   EP Information / Studies Reviewed:    EKG is ordered today. Personal review as below.      Risk Assessment/Calculations:            Physical Exam:   VS:  LMP  (LMP Unknown)    Wt Readings from Last 3 Encounters:  07/25/23 145 lb 14.4 oz (66.2 kg)  04/18/23 139 lb 12.8 oz (63.4 kg)  04/05/23 150 lb (68 kg)     GEN: Well nourished, well developed in no acute distress NECK: No JVD; No carotid bruits CARDIAC: Regular rate and rhythm, no murmurs, rubs, gallops RESPIRATORY:   Clear to auscultation without rales, wheezing or rhonchi  ABDOMEN: Soft, non-tender, non-distended EXTREMITIES:  No edema; No deformity   ASSESSMENT AND PLAN:    1.  Chest pain: Chest pain appears to be somewhat atypical.  Is not related to exertion and not improved with stress.  Despite this, she does have T wave inversions in the inferior leads on her EKG.  I do think that she would likely benefit from ruling out coronary artery disease.  Danzig Macgregor plan for PET/CT.  This Brandyn Thien also give us  an estimation of her ejection fraction.  2.  Palpitations: EKG is without obvious cause of palpitations.  She has both palpitations multiple times a week.  Dakarai Mcglocklin plan for 2-week monitor.  Follow up with Dr. Inocencio pending results   Signed, Alandria Butkiewicz Gladis Inocencio, MD

## 2023-08-08 NOTE — Progress Notes (Unsigned)
 Enrolled for [employer] to mail a ZIO XT long term holter monitor to the patients address on file.

## 2023-08-08 NOTE — Patient Instructions (Addendum)
 Medication Instructions:  Your physician recommends that you continue on your current medications as directed. Please refer to the Current Medication list given to you today.  *If you need a refill on your cardiac medications before your next appointment, please call your pharmacy*  Lab Work: None ordered  If you have any lab test that is abnormal or we need to change your treatment, we will call you to review the results.  Testing/Procedures:                          [name]- Long Term Monitor Instructions  Your physician has requested you wear a ZIO patch monitor for 14 days.  This is a single patch monitor. [employer] supplies one patch monitor per enrollment. Additional stickers are not available. Please do not apply patch if you will be having a Nuclear Stress Test,  Echocardiogram, Cardiac CT, MRI, or Chest Xray during the period you would be wearing the  monitor. The patch cannot be worn during these tests. You cannot remove and re-apply the  ZIO XT patch monitor.  Your ZIO patch monitor will be mailed 3 day USPS to your address on file. It may take 3-5 days  to receive your monitor after you have been enrolled.  Once you have received your monitor, please review the enclosed instructions. Your monitor  has already been registered assigning a specific monitor serial # to you.  Billing and Patient Assistance Program Information  We have supplied [employer] with any of your insurance information on file for billing purposes. [employer] offers a sliding scale Patient Assistance Program for patients that do not have  insurance, or whose insurance does not completely cover the cost of the ZIO monitor.  You must apply for the Patient Assistance Program to qualify for this discounted rate.  To apply, please call [employer] at [phone], select option 4, select option 2, ask to apply for  Patient Assistance Program. Meredeth will ask your household income, and how many people  are in your  household. They will quote your out-of-pocket cost based on that information.  [employer] will also be able to set up a 70-month, interest-free payment plan if needed.  Applying the monitor   Shave hair from upper left chest.  Hold abrader disc by orange tab. Rub abrader in 40 strokes over the upper left chest as  indicated in your monitor instructions.  Clean area with 4 enclosed alcohol pads. Let dry.  Apply patch as indicated in monitor instructions. Patch will be placed under collarbone on left  side of chest with arrow pointing upward.  Rub patch adhesive wings for 2 minutes. Remove white label marked 1. Remove the white  label marked 2. Rub patch adhesive wings for 2 additional minutes.  While looking in a mirror, press and release button in center of patch. A small green light will  flash 3-4 times. This will be your only indicator that the monitor has been turned on.  Do not shower for the first 24 hours. You may shower after the first 24 hours.  Press the button if you feel a symptom. You will hear a small click. Record Date, Time and  Symptom in the Patient Logbook.  When you are ready to remove the patch, follow instructions on the last 2 pages of Patient  Logbook. Stick patch monitor onto the last page of Patient Logbook.  Place Patient Logbook in the blue and white box. Use locking tab on  box and tape box closed  securely. The blue and white box has prepaid postage on it. Please place it in the mailbox as  soon as possible. Your physician should have your test results approximately 7 days after the  monitor has been mailed back to [facility].  Call [facility] Customer Care at [phone] if you have questions regarding  your ZIO XT patch monitor. Call them immediately if you see an orange light blinking on your  monitor.  If your monitor falls off in less than 4 days, contact our Monitor department at [phone].  If your monitor becomes loose or falls off after  4 days call [employer] at [phone] for  suggestions on securing your monitor     Your physician has recommended that you have a stress PET scan    Please report to Radiology at the [facility] Main Entrance 30 minutes early for your test.  [address] [place], [place] [address]                         OR   Please report to Radiology at [facility] Main Entrance, medical mall, 30 mins prior to your test.  [address]  [place], [place]  How to Prepare for Your Cardiac PET/CT Stress Test:  Nothing to eat or drink, except water, 3 hours prior to arrival time.  NO caffeine /decaffeinated products, or chocolate 12 hours prior to arrival. (Please note decaffeinated beverages (teas/coffees) still contain caffeine ).  If you have caffeine  within 12 hours prior, the test will need to be rescheduled.  Medication instructions: Do not take erectile dysfunction medications for 72 hours prior to test (sildenafil, tadalafil) Do not take nitrates (isosorbide mononitrate, Ranexa) the day before or day of test Do not take tamsulosin the day before or morning of test Hold theophylline containing medications for 12 hours. Hold Dipyridamole 48 hours prior to the test.  Diabetic Preparation: If able to eat breakfast prior to 3 hour fasting, you may take all medications, including your insulin. Do not worry if you miss your breakfast dose of insulin - start at your next meal. If you do not eat prior to 3 hour fast-Hold all diabetes (oral and insulin) medications. Patients who wear a continuous glucose monitor MUST remove the device prior to scanning.  You may take your remaining medications with water.  NO perfume, cologne or lotion on chest or abdomen area. FEMALES - Please avoid wearing dresses to this appointment.  Total time is 1 to 2 hours; you may want to bring reading material for the waiting time.  IF YOU THINK YOU MAY BE PREGNANT, OR ARE  NURSING PLEASE INFORM THE TECHNOLOGIST.  In preparation for your appointment, medication and supplies will be purchased.  Appointment availability is limited, so if you need to cancel or reschedule, please call the Radiology Department Scheduler at [phone] 24 hours in advance to avoid a cancellation fee of $100.00  What to Expect When you Arrive:  Once you arrive and check in for your appointment, you will be taken to a preparation room within the Radiology Department.  A technologist or Nurse will obtain your medical history, verify that you are correctly prepped for the exam, and explain the procedure.  Afterwards, an IV will be started in your arm and electrodes will be placed on your skin for EKG monitoring during the stress portion of the exam. Then you will be escorted to the PET/CT  scanner.  There, staff will get you positioned on the scanner and obtain a blood pressure and EKG.  During the exam, you will continue to be connected to the EKG and blood pressure machines.  A small, safe amount of a radioactive tracer will be injected in your IV to obtain a series of pictures of your heart along with an injection of a stress agent.    After your Exam:  It is recommended that you eat a meal and drink a caffeinated beverage to counter act any effects of the stress agent.  Drink plenty of fluids for the remainder of the day and urinate frequently for the first couple of hours after the exam.  Your doctor will inform you of your test results within 7-10 business days.  For more information and frequently asked questions, please visit our website: [email or web address]  For questions about your test or how to prepare for your test, please call: Cardiac Imaging Nurse Navigators Office: [phone]   Follow-Up: At [facility], you and your health needs are our priority.  As part of our continuing mission to provide you with exceptional heart care, our providers are all  part of one team.  This team includes your primary Cardiologist (physician) and Advanced Practice Providers or APPs (Physician Assistants and Nurse Practitioners) who all work together to provide you with the care you need, when you need it.  Your next appointment:   To be determined  Provider:   Soyla Norton, MD     Thank you for choosing [facility]!!   Maeola Domino, RN [phone]

## 2023-08-29 ENCOUNTER — Encounter: Payer: Self-pay | Admitting: Oncology

## 2023-08-30 ENCOUNTER — Other Ambulatory Visit: Payer: Self-pay | Admitting: Psychiatry

## 2023-08-30 DIAGNOSIS — F5101 Primary insomnia: Secondary | ICD-10-CM

## 2023-08-31 ENCOUNTER — Encounter: Payer: Self-pay | Admitting: Oncology

## 2023-09-01 ENCOUNTER — Other Ambulatory Visit: Payer: Self-pay | Admitting: Sports Medicine

## 2023-09-01 DIAGNOSIS — Z13820 Encounter for screening for osteoporosis: Secondary | ICD-10-CM

## 2023-09-08 ENCOUNTER — Other Ambulatory Visit: Payer: Self-pay | Admitting: Psychiatry

## 2023-09-08 DIAGNOSIS — F331 Major depressive disorder, recurrent, moderate: Secondary | ICD-10-CM

## 2023-09-09 ENCOUNTER — Encounter (HOSPITAL_COMMUNITY): Payer: Self-pay

## 2023-09-11 DIAGNOSIS — R002 Palpitations: Secondary | ICD-10-CM | POA: Diagnosis not present

## 2023-09-15 ENCOUNTER — Ambulatory Visit
Admission: RE | Admit: 2023-09-15 | Discharge: 2023-09-15 | Disposition: A | Discharge status: Home / Self Care | Source: Ambulatory Visit | Attending: Cardiology | Admitting: Cardiology

## 2023-09-15 DIAGNOSIS — R079 Chest pain, unspecified: Secondary | ICD-10-CM | POA: Diagnosis present

## 2023-09-15 MED ORDER — RUBIDIUM RB82 GENERATOR (RUBYFILL)
25.0000 | PACK | Freq: Once | INTRAVENOUS | Status: AC
  Administered 2023-09-15: 17.65 via INTRAVENOUS

## 2023-09-15 MED ORDER — RUBIDIUM RB82 GENERATOR (RUBYFILL)
25.0000 | PACK | Freq: Once | INTRAVENOUS | Status: AC
  Administered 2023-09-15: 17.68 via INTRAVENOUS

## 2023-09-15 MED ORDER — REGADENOSON 0.4 MG/5ML IV SOLN
INTRAVENOUS | Status: AC
  Filled 2023-09-15: qty 5

## 2023-09-15 MED ORDER — REGADENOSON 0.4 MG/5ML IV SOLN
0.4000 mg | Freq: Once | INTRAVENOUS | Status: AC
  Administered 2023-09-15: 0.4 mg via INTRAVENOUS
  Filled 2023-09-15: qty 5

## 2023-09-17 ENCOUNTER — Ambulatory Visit: Payer: Self-pay | Admitting: Cardiology

## 2023-09-17 DIAGNOSIS — R948 Abnormal results of function studies of other organs and systems: Secondary | ICD-10-CM

## 2023-09-17 LAB — NM PET CT CARDIAC PERFUSION MULTI W/ABSOLUTE BLOODFLOW
MBFR: 2.35
Nuc Rest EF: 57 %
Nuc Stress EF: 62 %
Peak HR: 126 {beats}/min
Rest HR: 70 {beats}/min
Rest MBF: 1.1 ml/g/min
Rest Nuclear Isotope Dose: 17.7 mCi
SRS: 3
SSS: 12
ST Depression (mm): 0 mm
Stress MBF: 2.58 ml/g/min
Stress Nuclear Isotope Dose: 17.7 mCi
TID: 0.93

## 2023-10-02 NOTE — Progress Notes (Unsigned)
 10/02/2023 [name] [id number] 1975-02-17   Chief Complaint:  History of Present Illness: Yolander Goodie. Becraft is a 48 year old female with a past medical history of anxiety, depression, arthritis, cardiomyopathy, gestational diabetes, post partum seizures, B12 deficiency, iron  deficiency anemia, neuropathy, SBO, GERD, IBS-D. S/P Roux-en-Y 2009. Past cholecystectomy. She is known by Dr. Federico.   Seen by cardiology 08/08/2023 due to having CP, SOB and palpitations   1.  Chest pain: Chest pain appears to be somewhat atypical.  Is not related to exertion and not improved with stress.  Despite this, she does have T wave inversions in the inferior leads on her EKG.  I do think that she would likely benefit from ruling out coronary artery disease.  Will plan for PET/CT.  This will also give us  an estimation of her ejection fraction.   2.  Palpitations: EKG is without obvious cause of palpitations.  She has both palpitations multiple times a week.  Will plan for 2-week monitor.    Latest Ref Rng & Units 07/18/2023    8:19 AM 04/18/2023    9:16 AM 04/05/2023    3:40 PM  CBC  WBC 4.0 - 10.5 K/uL 5.1  5.2  10.5   Hemoglobin 12.0 - 15.0 g/dL 86.7  86.6  87.0   Hematocrit 36.0 - 46.0 % 42.3  40.9  41.0   Platelets 150 - 400 K/uL 313  604.0  297        Latest Ref Rng & Units 04/18/2023    9:16 AM 04/05/2023    3:40 PM 03/25/2023    8:22 AM  CMP  Glucose 70 - 99 mg/dL 90  893  873   BUN 6 - 23 mg/dL 10  10  12    Creatinine 0.40 - 1.20 mg/dL 9.31  9.30  9.32   Sodium 135 - 145 mEq/L 139  137  137   Potassium 3.5 - 5.1 mEq/L 4.5  3.8  3.3   Chloride 96 - 112 mEq/L 106  107  106   CO2 19 - 32 mEq/L 23  19  22    Calcium 8.4 - 10.5 mg/dL 9.4  8.5  9.0   Total Protein 6.0 - 8.3 g/dL 7.1  6.8    Total Bilirubin 0.2 - 1.2 mg/dL 0.5  0.4    Alkaline Phos 39 - 117 U/L 36  67    AST 0 - 37 U/L 13  68    ALT 0 - 35 U/L 22  111      Cardiac PET scan 09/15/2023:   Findings are consistent  with anterior ischemia with no LAD calcifications and normal blood flow reserve. The study is intermediate risk.   LV perfusion is abnormal. There is evidence of ischemia. Defect 1: There is a medium defect with moderate reduction in uptake present in the apical to basal anterior location(s) that is reversible. There is abnormal wall motion in the defect area. Consistent with ischemia.   End diastolic cavity size is normal. End systolic cavity size is normal.   Myocardial blood flow was computed to be 1.55ml/g/min at rest and 2.58ml/g/min at stress. Global myocardial blood flow reserve was 2.35 and was normal.   Coronary calcium was absent on the attenuation correction CT images.   Electronically Signed  By: Stanly Leavens M.D.  Small bowel enteroscopy 05/12/2023: FINDINGS: Lower chest:  Lung bases are clear.   Hepatobiliary: Liver is within normal limits.   Status post cholecystectomy. No intrahepatic  or extrahepatic ductal dilatation.   Pancreas: Within normal limits.   Spleen: Within normal limits.   Adrenals/Urinary Tract: Adrenal glands are within normal limits.   Subcentimeter left lower pole renal cyst (series 2/image 35), benign (Bosniak I). No follow-up is recommended.   Right kidney is within normal limits.  No hydronephrosis.   Bladder is within normal limits.   Stomach/Bowel: Postsurgical changes related to gastric bypass with patent gastrojejunostomy.   No evidence of bowel obstruction. No small bowel wall thickening or inflammatory changes. Specifically, the terminal ileum is unremarkable.   Normal appendix (series 2/image 54).   No colonic wall thickening or inflammatory changes.   Vascular/Lymphatic: No evidence of abdominal aortic aneurysm.   No suspicious abdominopelvic lymphadenopathy.   Reproductive: Status post hysterectomy.   Bilateral ovaries are within normal limits.   Other: No abdominopelvic ascites.   Musculoskeletal: Visualized  osseous structures are within normal limits.   IMPRESSION: No CT findings to suggest active inflammatory bowel disease.   Status post gastric bypass.      PAST GI PROCEDURES:   EGD 10/13/21: - Normal esophagus. Biopsied. - Roux-en-Y gastrojejunostomy with gastrojejunal anastomosis characterized by healthy appearing mucosa. - A single gastric polyp. Resected and retrieved. - A single gastric polyp. Resected and retrieved. - Normal examined jejunum. Biopsied. Path: 1. Surgical [P], small bowel - SMALL INTESTINAL MUCOSA WITH NO SPECIFIC HISTOPATHOLOGIC CHANGES - NEGATIVE FOR INCREASED INTRAEPITHELIAL LYMPHOCYTES OR VILLOUS ARCHITECTURAL CHANGES 2. Surgical [P], gastric - GASTRIC ANTRAL MUCOSA WITH MILD NONSPECIFIC REACTIVE GASTROPATHY - HELICOBACTER PYLORI-LIKE ORGANISMS ARE NOT IDENTIFIED ON ROUTINE H&E STAIN 3. Surgical [P], nodular gastric mucosa - GASTRIC ANTRAL AND OXYNTIC MUCOSA WITH NONSPECIFIC HYPERPLASTIC CHANGES - NEGATIVE FOR INTESTINAL METAPLASIA OR DYSPLASIA 4. Surgical [P], esophageal - ESOPHAGEAL SQUAMOUS MUCOSA WITH VASCULAR CONGESTION, SQUAMOUS BALLOONING AND FEW INTRAEPITHELIAL EOSINOPHILS (UP TO 3/HIGH POWER FIELD), CONSISTENT WITH REFLUX ESOPHAGITIS   EGD 01/24/2018: - Normal esophagus.  - Roux-en-Y gastrojejunostomy with gastrojejunal anastomosis characterized by healthy appearing mucosa.  - Normal examined jejunum. Biopsied.  - The examination was otherwise normal   Colonoscopy 01/24/2018: - Tortuous colon.  - Normal mucosa in the entire examined colon. Biopsied.  - Non-bleeding internal hemorrhoids.  - The examination was otherwise normal JEJUNUM; COLD BIOPSY:  - ENTERIC MUCOSA WITH PRESERVED VILLOUS ARCHITECTURE AND NO SIGNIFICANT  HISTOPATHOLOGIC CHANGE.  - NEGATIVE FOR FEATURES OF CELIAC, DYSPLASIA, AND MALIGNANCY.   B. COLON, RANDOM; COLD BIOPSY:  - BENIGN COLONIC MUCOSA WITH SMALL LYMPHOID AGGREGATES, OTHERWISE NO  SIGNIFICANT HISTOPATHOLOGIC  CHANGE.  - NEGATIVE FOR FEATURES OF MICROSCOPIC COLITIS, DYSPLASIA, AND  MALIGNANCY.    EGD 05/08/2013: -Normal esophagus -Normal gastric pouch -Roux-en-Y gastrojejunostomy.  The gastrojejunal anastomosis had erythema and friable mucosa. Biopsied. Normal-appearing jejunum.  Part A: JEJUNUM COLD BIOPSY: - SMALL INTESTINAL MUCOSA WITH INTACT VILLI. - NO ACTIVE INFLAMMATION, INTRAEPITHELIAL LYMPHOCYTOSIS, EROSION, OR GRANULOMAS. SABRA Part B: GASTRIC POUCH COLD BIOPSY: - GASTRIC OXYNTIC MUCOSA WITH MILD REACTIVE FOVEOLAR HYPERPLASIA. - NO ACTIVE INFLAMMATION, ATROPHY, OR INTESTINAL METAPLASIA. - NO HELICOBACTER PYLORI IDENTIFIED IN HEMATOXYLIN AND EOSIN SECTIONS. . Part C: GE ANASTOMOSIS COLD BIOPSY: - SMALL INTESTINAL-TYPE MUCOSA WITH MODERATE NONSPECIFIC CHRONIC ACTIVE INFLAMMATION.    EGD and Colonoscopy  2008 at [facility]: Unremarkable      Current Medications, Allergies, Past Medical History, Past Surgical History, Family History and Social History were reviewed in [employer] record.   Review of Systems:   Constitutional: Negative for fever, sweats, chills or weight loss.  Respiratory: Negative for shortness  of breath.   Cardiovascular: Negative for chest pain, palpitations and leg swelling.  Gastrointestinal: See HPI.  Musculoskeletal: Negative for back pain or muscle aches.  Neurological: Negative for dizziness, headaches or paresthesias.    Physical Exam: LMP  (LMP Unknown)  General: in no acute distress. Head: Normocephalic and atraumatic. Eyes: No scleral icterus. Conjunctiva pink . Ears: Normal auditory acuity. Mouth: Dentition intact. No ulcers or lesions.  Lungs: Clear throughout to auscultation. Heart: Regular rate and rhythm, no murmur. Abdomen: Soft, nontender and nondistended. No masses or hepatomegaly. Normal bowel sounds x 4 quadrants.  Rectal: Deferred.  Musculoskeletal: Symmetrical with no gross deformities. Extremities: No  edema. Neurological: Alert oriented x 4. No focal deficits.  Psychological: Alert and cooperative. Normal mood and affect  Assessment and Recommendations: ***

## 2023-10-03 ENCOUNTER — Other Ambulatory Visit (INDEPENDENT_AMBULATORY_CARE_PROVIDER_SITE_OTHER)

## 2023-10-03 ENCOUNTER — Ambulatory Visit: Payer: Self-pay | Admitting: Nurse Practitioner

## 2023-10-03 ENCOUNTER — Ambulatory Visit (INDEPENDENT_AMBULATORY_CARE_PROVIDER_SITE_OTHER): Admitting: Nurse Practitioner

## 2023-10-03 ENCOUNTER — Encounter: Payer: Self-pay | Admitting: Nurse Practitioner

## 2023-10-03 VITALS — BP 98/68 | HR 100 | Ht 67.0 in | Wt 141.2 lb

## 2023-10-03 DIAGNOSIS — K529 Noninfective gastroenteritis and colitis, unspecified: Secondary | ICD-10-CM | POA: Diagnosis not present

## 2023-10-03 DIAGNOSIS — E538 Deficiency of other specified B group vitamins: Secondary | ICD-10-CM

## 2023-10-03 DIAGNOSIS — D509 Iron deficiency anemia, unspecified: Secondary | ICD-10-CM

## 2023-10-03 DIAGNOSIS — E559 Vitamin D deficiency, unspecified: Secondary | ICD-10-CM

## 2023-10-03 DIAGNOSIS — K2100 Gastro-esophageal reflux disease with esophagitis, without bleeding: Secondary | ICD-10-CM

## 2023-10-03 DIAGNOSIS — R748 Abnormal levels of other serum enzymes: Secondary | ICD-10-CM

## 2023-10-03 DIAGNOSIS — Z9884 Bariatric surgery status: Secondary | ICD-10-CM

## 2023-10-03 LAB — COMPREHENSIVE METABOLIC PANEL WITH GFR
ALT: 29 U/L (ref 0–35)
AST: 22 U/L (ref 0–37)
Albumin: 4.4 g/dL (ref 3.5–5.2)
Alkaline Phosphatase: 27 U/L — ABNORMAL LOW (ref 39–117)
BUN: 12 mg/dL (ref 6–23)
CO2: 27 meq/L (ref 19–32)
Calcium: 9.2 mg/dL (ref 8.4–10.5)
Chloride: 104 meq/L (ref 96–112)
Creatinine, Ser: 0.7 mg/dL (ref 0.40–1.20)
GFR: 104.27 mL/min (ref >60.00)
Glucose, Bld: 86 mg/dL (ref 70–99)
Potassium: 4.4 meq/L (ref 3.5–5.1)
Sodium: 140 meq/L (ref 135–145)
Total Bilirubin: 0.4 mg/dL (ref 0.2–1.2)
Total Protein: 7.5 g/dL (ref 6.0–8.3)

## 2023-10-03 LAB — VITAMIN B12: Vitamin B-12: 141 pg/mL — ABNORMAL LOW (ref 211–911)

## 2023-10-03 LAB — VITAMIN D 25 HYDROXY (VIT D DEFICIENCY, FRACTURES): VITD: 17.87 ng/mL — ABNORMAL LOW (ref 30.00–100.00)

## 2023-10-03 MED ORDER — PANTOPRAZOLE SODIUM 40 MG PO TBEC: 40.0000 mg | DELAYED_RELEASE_TABLET | Freq: Every day | ORAL | 2 refills | Status: AC

## 2023-10-03 NOTE — Patient Instructions (Addendum)
 Eat a green banana and dried apple chips daily. They contain a natural constipating fiber called Pectin.  Proceed with cardiac evaluation.  Thank you for trusting me with your gastrointestinal care!  Elida Shawl, CRNP   _______________________________________________________  If your blood pressure at your visit was 140/90 or greater, please contact your primary care physician to follow up on this.  _______________________________________________________  If you are age 16 or older, your body mass index should be between 23-30. Your Body mass index is 22.12 kg/m. If this is out of the aforementioned range listed, please consider follow up with your Primary Care Provider.  If you are age 25 or younger, your body mass index should be between 19-25. Your Body mass index is 22.12 kg/m. If this is out of the aformentioned range listed, please consider follow up with your Primary Care Provider.   ________________________________________________________  The [facility] providers would like to encourage you to use MYCHART to communicate with providers for non-urgent requests or questions.  Due to long hold times on the telephone, sending your provider a message by [facility] may be a faster and more efficient way to get a response.  Please allow 48 business hours for a response.  Please remember that this is for non-urgent requests.  _______________________________________________________  [facility] is using a team-based approach to care.  Your team is made up of your doctor and two to three APPS. Our APPS (Nurse Practitioners and Physician Assistants) work with your physician to ensure care continuity for you. They are fully qualified to address your health concerns and develop a treatment plan. They communicate directly with your gastroenterologist to care for you. Seeing the Advanced Practice Practitioners on your physician's team can help you by facilitating care more  promptly, often allowing for earlier appointments, access to diagnostic testing, procedures, and other specialty referrals.

## 2023-10-03 NOTE — Progress Notes (Signed)
 I agree with the assessment and plan as outlined by Ms. Riley Kill.

## 2023-10-04 ENCOUNTER — Telehealth (INDEPENDENT_AMBULATORY_CARE_PROVIDER_SITE_OTHER): Admitting: Psychiatry

## 2023-10-04 ENCOUNTER — Encounter: Payer: Self-pay | Admitting: Psychiatry

## 2023-10-04 DIAGNOSIS — F331 Major depressive disorder, recurrent, moderate: Secondary | ICD-10-CM

## 2023-10-04 DIAGNOSIS — F3342 Major depressive disorder, recurrent, in full remission: Secondary | ICD-10-CM | POA: Diagnosis not present

## 2023-10-04 DIAGNOSIS — F5101 Primary insomnia: Secondary | ICD-10-CM

## 2023-10-04 DIAGNOSIS — F4310 Post-traumatic stress disorder, unspecified: Secondary | ICD-10-CM | POA: Diagnosis not present

## 2023-10-04 DIAGNOSIS — F410 Panic disorder [episodic paroxysmal anxiety] without agoraphobia: Secondary | ICD-10-CM

## 2023-10-04 LAB — CERULOPLASMIN: Ceruloplasmin: 29 mg/dL (ref 14–48)

## 2023-10-04 NOTE — Progress Notes (Signed)
 Virtual Visit via Video Note  I connected with [name] on 10/04/23 at 11:20 AM EDT by a video enabled telemedicine application and verified that I am speaking with the correct person using two identifiers.  Location Provider Location : [facility] Patient Location : Home  Participants: Patient , Provider    I discussed the limitations of evaluation and management by telemedicine and the availability of in person appointments. The patient expressed understanding and agreed to proceed.   I discussed the assessment and treatment plan with the patient. The patient was provided an opportunity to ask questions and all were answered. The patient agreed with the plan and demonstrated an understanding of the instructions.   The patient was advised to call back or seek an in-person evaluation if the symptoms worsen or if the condition fails to improve as anticipated.   BH MD OP Progress Note  10/04/2023 11:30 AM [name]  MRN:  [id number]  Chief Complaint:  Chief Complaint  Patient presents with   Follow-up   Depression   Anxiety   Insomnia   Medication Refill   Discussed the use of AI scribe software for clinical note transcription with the patient, who gave verbal consent to proceed.  History of Present Illness [name] is a 48 year old Caucasian female, stay-at-home mom, divorced, lives in [place], has a history of MDD, panic disorder, PTSD, primary insomnia, history of seizure disorder, chronic pain, history of gastric bypass ( 2009 ), small bowel obstruction was evaluated by telemedicine today.  She reports that she has felt generally unwell and tired since her surgery in February. Although she describes feeling 'tiny,' she states she is doing fine for the most part and continues to manage daily responsibilities, including caring for her child who recently returned to school.  She is currently under the care of cardiology after an abnormal EKG completed in  the emergency department 04/06/2023 which showed T wave inversion in the inferior leads II, III, aVF and lateral leads V4 through V6.  She was seen by EP cardiologist Dr. Inocencio 08/08/2023 due to having chest pain and shortness of breath and palpitation.  She underwent long-term cardiac monitors which showed sinus rhythm and sinus tachycardia.  She underwent a cardiac PET scan 09/15/2023 which showed evidence of anterior ischemia and was advised to undergo further cardiac evaluation with general cardiology and has upcoming appointment scheduled for 11/17/2023.  Her physical complaints has been anxiety provoking for her.  She however reports she is overall managing it well and is not interested in any medication changes.  Her current regimen includes Viibryd  40 mg and Wellbutrin  (bupropion ) 150 mg, which she takes regularly. She uses Buspar  5-10 mg and hydroxyzine  15 mg as needed, and includes zolpidem  in her medication routine. She states she has stopped taking Lamictal  200 mg, having weaned herself off due to concerns about long-term effects on bone density after reading about potential risks ( on google) . She does not report any recent changes to her other medications.  She denies any thoughts of harming herself or others.    Visit Diagnosis:    ICD-10-CM   1. Recurrent major depressive disorder, in full remission (HCC)  F33.42     2. Panic disorder  F41.0     3. PTSD (post-traumatic stress disorder)  F43.10     4. Primary insomnia  F51.01       Past Psychiatric History: I have reviewed past psychiatric history from progress note on 05/09/2017.  Past  Medical History:  Past Medical History:  Diagnosis Date   Abnormal ECG    Anemia    Anxiety    Arthritis    Asthma    B12 deficiency    Cardiomyopathy (HCC)    Chronic abdominal pain    Complication of anesthesia    difficulty to get sedated during endoscopy   COVID-19 01/18/2019   DDD (degenerative disc disease), lumbar     Depression    Diabetes mellitus without complication (HCC)    ONLY gestational diabetes, does not have chronic diabetes   Dysrhythmia    Family history of adverse reaction to anesthesia    brother woke during surgery   GERD (gastroesophageal reflux disease)    History of kidney stones    Iron  deficiency    Migraine headache    Neuropathy    Obesity    gastric bypass 2009   Ovarian cyst    Pelvic mass    Pneumonia    within past five years   PTSD (post-traumatic stress disorder)    PTSD (post-traumatic stress disorder)    Rh negative status during pregnancy    Seizures (HCC)    post-gestational   Small bowel obstruction (HCC)    Vitamin D  deficiency     Past Surgical History:  Procedure Laterality Date   ABDOMINAL HYSTERECTOMY     BILATERAL SALPINGECTOMY Bilateral 09/29/2015   Procedure: BILATERAL SALPINGECTOMY;  Surgeon: Heather Penton, MD;  Location: [facility];  Service: Gynecology;  Laterality: Bilateral;   bowel obstruction  2011   CHOLECYSTECTOMY     COLONOSCOPY WITH PROPOFOL  N/A 01/24/2018   Procedure: COLONOSCOPY WITH PROPOFOL ;  Surgeon: Toledo, Ladell POUR, MD;  Location: [facility];  Service: Gastroenterology;  Laterality: N/A;   DILATION AND CURETTAGE OF UTERUS     ESOPHAGOGASTRODUODENOSCOPY     ESOPHAGOGASTRODUODENOSCOPY (EGD) WITH PROPOFOL  N/A 01/24/2018   Procedure: ESOPHAGOGASTRODUODENOSCOPY (EGD) WITH PROPOFOL ;  Surgeon: Toledo, Ladell POUR, MD;  Location: [facility];  Service: Gastroenterology;  Laterality: N/A;   GASTRIC BYPASS  2010   gi bleed  2009   Surgery-was in ICU for three weeks   HERNIA REPAIR     OVARIAN CYST REMOVAL Left 09/29/2015   Procedure: OVARIAN CYSTECTOMY;  Surgeon: Heather Penton, MD;  Location: [facility];  Service: Gynecology;  Laterality: Left;   ROBOTIC ASSISTED BILATERAL SALPINGO OOPHERECTOMY Bilateral 04/01/2023   Procedure: XI ROBOTIC ASSISTED BILATERAL SALPINGO OOPHORECTOMY,;  Surgeon: Penton Heather, MD;  Location: [facility]  [facility];  Service: Gynecology;  Laterality: Bilateral;   ROBOTIC ASSISTED LAPAROSCOPIC LYSIS OF ADHESION N/A 04/01/2023   Procedure: XI ROBOTIC ASSISTED LAPAROSCOPIC LYSIS OF ADHESION;  Surgeon: Penton Heather, MD;  Location: [facility];  Service: Gynecology;  Laterality: N/A;   ROUX-EN-Y PROCEDURE     VAGINAL HYSTERECTOMY N/A 09/29/2015   Procedure: HYSTERECTOMY VAGINAL;  Surgeon: Heather Penton, MD;  Location: [facility];  Service: Gynecology;  Laterality: N/A;   XI ROBOT ASSISTED DIAGNOSTIC LAPAROSCOPY N/A 04/01/2023   Procedure: XI ROBOT ASSISTED DIAGNOSTIC LAPAROSCOPY, POSSIBLE EXCISION OF ENDOMETRIOSIS, REMOVAL OF PELVIC MASS;  Surgeon: Penton Heather, MD;  Location: [facility];  Service: Gynecology;  Laterality: N/A;    Family Psychiatric History: I have reviewed family psychiatric history from progress note on 05/09/2017.  Family History:  Family History  Problem Relation Age of Onset   Arthritis/Rheumatoid Mother    Heart block Mother    Clotting disorder Mother    Osteoporosis Mother    Heart attack Mother    Anxiety disorder Mother  Depression Mother    Colon polyps Mother    Stomach cancer Father 47   Heart disease Father    Heart attack Father    Depression Sister    Anxiety disorder Sister    Colon polyps Sister    Colon cancer Neg Hx    Esophageal cancer Neg Hx    Pancreatic cancer Neg Hx     Social History: I have reviewed social history from progress note on 05/09/2017. Social History   Socioeconomic History   Marital status: Single    Spouse name: Not on file   Number of children: 2   Years of education: Not on file   Highest education level: Not on file  Occupational History   Occupation: disabled  Tobacco Use   Smoking status: Never   Smokeless tobacco: Never  Vaping Use   Vaping status: Never Used  Substance and Sexual Activity   Alcohol use: No    Alcohol/week: 0.0 standard drinks of alcohol   Drug use: No   Sexual activity: Not on file  Other  Topics Concern   Not on file  Social History Narrative   Lives with mother, daughter, son, and fiance. Pets, dogs, in home.   Social Drivers of Corporate Investment Banker Strain: Not on file  Food Insecurity: Not on file  Transportation Needs: Not on file  Physical Activity: Not on file  Stress: Not on file  Social Connections: Not on file    Allergies:  Allergies  Allergen Reactions   Amoxicillin Anaphylaxis, Hives, Shortness Of Breath, Swelling and Rash    Has patient had a PCN reaction causing immediate rash, facial/tongue/throat swelling, SOB or lightheadedness with hypotension: Yes Has patient had a PCN reaction causing severe rash involving mucus membranes or skin necrosis: Yes Has patient had a PCN reaction that required hospitalization No Has patient had a PCN reaction occurring within the last 10 years: No If all of the above answers are NO, then may proceed with Cephalosporin use.   Penicillins Anaphylaxis, Hives, Shortness Of Breath, Swelling and Rash    Has patient had a PCN reaction causing immediate rash, facial/tongue/throat swelling, SOB or lightheadedness with hypotension: Yes Has patient had a PCN reaction causing severe rash involving mucus membranes or skin necrosis: Yes Has patient had a PCN reaction that required hospitalization No Has patient had a PCN reaction occurring within the last 10 years: No If all of the above answers are NO, then may proceed with Cephalosporin use.   Morphine Other (See Comments)    Muscle spasms   Sulfa Antibiotics Nausea And Vomiting    Metabolic Disorder Labs: Lab Results  Component Value Date   HGBA1C 5.5 12/11/2019   MPG 111 12/11/2019   Lab Results  Component Value Date   PROLACTIN 12.1 12/11/2019   Lab Results  Component Value Date   CHOL 162 12/11/2019   TRIG 51 12/11/2019   HDL 87 12/11/2019   CHOLHDL 1.9 12/11/2019   VLDL 10 12/11/2019   LDLCALC 65 12/11/2019   Lab Results  Component Value Date    TSH 1.886 12/11/2019   TSH  05/13/2009    3.163 (NOTE) Please note change in reference ranges for ages 41W to 30Y. Test methodology is 3rd generation TSH    Therapeutic Level Labs: No results found for: LITHIUM No results found for: VALPROATE No results found for: CBMZ  Current Medications: Current Outpatient Medications  Medication Sig Dispense Refill   acetaminophen  (TYLENOL ) 325 MG tablet Take  650 mg by mouth every 4 (four) hours as needed for moderate pain.      ALPRAZolam  (XANAX ) 0.25 MG tablet TAKE 1 TABLET BY MOUTH EVERY DAY AS NEEDED FOR UP TO 3 DAYS FOR ANXIETY     buPROPion  (WELLBUTRIN  XL) 150 MG 24 hr tablet Take 1 tablet (150 mg total) by mouth daily. 90 tablet 1   busPIRone  (BUSPAR ) 10 MG tablet Take 0.5-1 tablets (5-10 mg total) by mouth daily as needed. 30 tablet 1   cyanocobalamin  (VITAMIN B12) 1000 MCG/ML injection Please administer 1000 mcg injection every 3 days 10 mL 4   cyanocobalamin  (VITAMIN B12) 1000 MCG/ML injection Inject 1 mL (1,000 mcg total) into the muscle every 3 (three) days. 30 mL 1   diphenoxylate -atropine  (LOMOTIL ) 2.5-0.025 MG tablet Take 1 tablet by mouth as needed. 60 tablet 0   estradiol (VIVELLE-DOT) 0.1 MG/24HR patch Place 1 patch onto the skin 2 (two) times a week.     famotidine  (PEPCID ) 20 MG tablet TAKE 1 TABLET BY MOUTH AT BEDTIME 30 tablet 0   HYDROcodone-acetaminophen  (NORCO) 7.5-325 MG tablet Take 1 tablet by mouth every 6 (six) hours as needed for moderate pain.     hydrOXYzine  (ATARAX ) 50 MG tablet TAKE 1 TABLET(50 MG) BY MOUTH TWICE DAILY AS NEEDED 60 tablet 2   Multiple Vitamins-Minerals (HM MULTIVITAMIN ADULT GUMMY) CHEW Chew 2 tablets by mouth daily.     pantoprazole  (PROTONIX ) 40 MG tablet Take 1 tablet (40 mg total) by mouth daily. 30 tablet 2   SUMAtriptan  (IMITREX ) 100 MG tablet Take 100 mg by mouth every 2 (two) hours as needed for migraine or headache.      topiramate  (TOPAMAX ) 50 MG tablet Take 100 mg by mouth 2 (two)  times daily.     VENTOLIN HFA 108 (90 Base) MCG/ACT inhaler Inhale 2 puffs into the lungs every 4 (four) hours as needed.     Vilazodone  HCl (VIIBRYD ) 40 MG TABS TAKE 1 TABLET BY MOUTH DAILY 90 tablet 3   Vitamin D , Ergocalciferol , (DRISDOL ) 1.25 MG (50000 UNIT) CAPS capsule TAKE 1 CAPSULE BY MOUTH ONCE DAILY 30 capsule 3   zolpidem  (AMBIEN  CR) 12.5 MG CR tablet Take 1 tablet (12.5 mg total) by mouth at bedtime. 30 tablet 2   No current facility-administered medications for this visit.     Musculoskeletal: Strength & Muscle Tone: UTA Gait & Station: Seated Patient leans: N/A  Psychiatric Specialty Exam: Review of Systems  Psychiatric/Behavioral:  The patient is nervous/anxious.     There were no vitals taken for this visit.There is no height or weight on file to calculate BMI.  General Appearance: Casual  Eye Contact:  Fair  Speech:  Clear and Coherent  Volume:  Normal  Mood:  Anxious  Affect:  Appropriate  Thought Process:  Goal Directed and Descriptions of Associations: Intact  Orientation:  Full (Time, Place, and Person)  Thought Content: Logical   Suicidal Thoughts:  No  Homicidal Thoughts:  No  Memory:  Immediate;   Fair Recent;   Fair Remote;   Fair  Judgement:  Fair  Insight:  Fair  Psychomotor Activity:  Normal  Concentration:  Concentration: Fair and Attention Span: Fair  Recall:  [employer] of Knowledge: Fair  Language: Fair  Akathisia:  No  Handed:  Right  AIMS (if indicated): not done  Assets:  Communication Skills Desire for Improvement Housing Social Support Transportation  ADL's:  Intact  Cognition: WNL  Sleep:  Fair   Screenings:  AIMS    Flowsheet Row Video Visit from 06/16/2021 in [facility]  AIMS Total Score 0   GAD-7    Flowsheet Row Office Visit from 03/24/2023 in [facility] Office Visit from 07/05/2022 in [facility]  Office Visit from 05/05/2022 in [facility] Office Visit from 03/19/2022 in [facility] Office Visit from 02/25/2022 in [facility]  Total GAD-7 Score 16 8 9 3 7    PHQ2-9    Flowsheet Row Office Visit from 03/24/2023 in [facility] Video Visit from 10/06/2022 in [facility] Office Visit from 07/05/2022 in [facility] Office Visit from 05/05/2022 in [facility] Office Visit from 03/19/2022 in [facility]  PHQ-2 Total Score 1 1 4 2 2   PHQ-9 Total Score -- -- 9 8 4    Flowsheet Row Video Visit from 10/04/2023 in [facility] Video Visit from 07/05/2023 in [facility] Video Visit from 05/20/2023 in [facility]  C-SSRS RISK CATEGORY No Risk No Risk No Risk     Assessment and Plan: [name] is a 48 year old Caucasian female who has a history of anxiety, insomnia was evaluated by telemedicine today.  Discussed assessment and plan as noted below.  Panic disorder/PTSD-improving Currently denies any significant anxiety attacks although worried about her physical complaints and cardiac problems overall managing okay. Continue Viibryd  40 mg daily Continue Hydroxyzine  50 mg twice a day as needed Continue BuSpar  5 to 10 mg daily as needed.  MDD in remission Currently does have sadness regarding her physical complaints although denies any significant depression symptoms.  She is currently noncompliant on Lamictal  taper herself off of it due to concerns about side effects risk in the future. Continue Viibryd  40 mg daily Continue Wellbutrin  XL 150 mg daily Discontinue  Lamictal  for noncompliance.  Insomnia-improving Currently reports sleep is overall good on the current medication regimen Continue zolpidem  extended release 12.5 mg daily at bedtime Continue sleep hygiene techniques.  Patient was encouraged to establish care with therapist previously although she has been noncompliant.  Follow-up in clinic in 3 months or sooner if needed.  Consent: Patient/Guardian gives verbal consent for treatment and assignment of benefits for services provided during this visit. Patient/Guardian expressed understanding and agreed to proceed.   This note was generated in part or whole with voice recognition software. Voice recognition is usually quite accurate but there are transcription errors that can and very often do occur. I apologize for any typographical errors that were not detected and corrected.    Jimmie Dattilio, MD 10/05/2023, 5:49 PM

## 2023-10-05 ENCOUNTER — Other Ambulatory Visit: Payer: Self-pay

## 2023-10-05 DIAGNOSIS — E559 Vitamin D deficiency, unspecified: Secondary | ICD-10-CM

## 2023-10-05 DIAGNOSIS — E538 Deficiency of other specified B group vitamins: Secondary | ICD-10-CM

## 2023-10-05 MED ORDER — CYANOCOBALAMIN 1000 MCG/ML IJ SOLN: 1000.0000 ug | INTRAMUSCULAR | 1 refills | Status: DC

## 2023-10-24 ENCOUNTER — Other Ambulatory Visit: Payer: Self-pay | Admitting: *Deleted

## 2023-10-24 DIAGNOSIS — D509 Iron deficiency anemia, unspecified: Secondary | ICD-10-CM

## 2023-10-25 ENCOUNTER — Inpatient Hospital Stay: Attending: Oncology

## 2023-10-25 DIAGNOSIS — D509 Iron deficiency anemia, unspecified: Secondary | ICD-10-CM | POA: Insufficient documentation

## 2023-10-25 LAB — CBC WITH DIFFERENTIAL/PLATELET
Abs Immature Granulocytes: 0.03 K/uL (ref 0.00–0.07)
Basophils Absolute: 0.1 K/uL (ref 0.0–0.1)
Basophils Relative: 1 %
Eosinophils Absolute: 0.1 K/uL (ref 0.0–0.5)
Eosinophils Relative: 1 %
HCT: 43.3 % (ref 36.0–46.0)
Hemoglobin: 14 g/dL (ref 12.0–15.0)
Immature Granulocytes: 1 %
Lymphocytes Relative: 21 %
Lymphs Abs: 1.4 K/uL (ref 0.7–4.0)
MCH: 29.6 pg (ref 26.0–34.0)
MCHC: 32.3 g/dL (ref 30.0–36.0)
MCV: 91.5 fL (ref 80.0–100.0)
Monocytes Absolute: 0.6 K/uL (ref 0.1–1.0)
Monocytes Relative: 9 %
Neutro Abs: 4.3 K/uL (ref 1.7–7.7)
Neutrophils Relative %: 67 %
Platelets: 308 K/uL (ref 150–400)
RBC: 4.73 MIL/uL (ref 3.87–5.11)
RDW: 12.8 % (ref 11.5–15.5)
WBC: 6.4 K/uL (ref 4.0–10.5)
nRBC: 0 % (ref 0.0–0.2)

## 2023-10-25 LAB — IRON AND TIBC
Iron: 92 ug/dL (ref 28–170)
Saturation Ratios: 22 % (ref 10.4–31.8)
TIBC: 419 ug/dL (ref 250–450)
UIBC: 327 ug/dL

## 2023-10-25 LAB — FERRITIN: Ferritin: 22 ng/mL (ref 11–307)

## 2023-10-26 ENCOUNTER — Inpatient Hospital Stay (HOSPITAL_BASED_OUTPATIENT_CLINIC_OR_DEPARTMENT_OTHER): Admitting: Oncology

## 2023-10-26 ENCOUNTER — Encounter: Payer: Self-pay | Admitting: Oncology

## 2023-10-26 DIAGNOSIS — D509 Iron deficiency anemia, unspecified: Secondary | ICD-10-CM | POA: Diagnosis not present

## 2023-10-26 NOTE — Progress Notes (Signed)
 [facility]  [phone] [phone]  ID: [name] OB: 1975-12-29  MR#: [id number]  RDW#:[id number]  Patient Care Team: Loring Tanda Mae, MD as PCP - General (Family Medicine) Inocencio Soyla Lunger, MD as PCP - Electrophysiology (Cardiology) Jacobo Evalene PARAS, MD as Consulting Physician (Oncology)  I connected with [name] on 10/26/23 at  1:45 PM EDT by video enabled telemedicine visit and verified that I am speaking with the correct person using two identifiers.   I discussed the limitations, risks, security and privacy concerns of performing an evaluation and management service by telemedicine and the availability of in-person appointments. I also discussed with the patient that there may be a patient responsible charge related to this service. The patient expressed understanding and agreed to proceed.   Other persons participating in the visit and their role in the encounter: Patient, MD.  Patient's location: Home. Provider's location: Clinic.  CHIEF COMPLAINT: Iron  deficiency anemia.   INTERVAL HISTORY: Patient agreed to video-assisted telemedicine visit for further evaluation and discussion of her laboratory results.  She continues to have chronic fatigue, but otherwise feels well.  She has no neurologic complaints.  She denies any recent fevers or illnesses.  She has a good appetite and denies weight loss.  She has no chest pain, shortness of breath, cough, or hemoptysis.  She denies any nausea, vomiting, constipation, or diarrhea.  She has no melena or hematochezia.  She has no urinary complaints.  Patient offers no further specific complaints today.  REVIEW OF SYSTEMS:   Review of Systems  Constitutional:  Positive for malaise/fatigue. Negative for fever and weight loss.  Respiratory: Negative.  Negative for cough and shortness of breath.   Cardiovascular: Negative.  Negative for chest pain and leg swelling.   Gastrointestinal: Negative.  Negative for abdominal pain, blood in stool and melena.  Genitourinary: Negative.  Negative for hematuria.  Musculoskeletal: Negative.  Negative for back pain, joint pain and neck pain.  Skin: Negative.  Negative for rash.  Neurological: Negative.  Negative for dizziness and weakness.  Psychiatric/Behavioral: Negative.  Negative for depression. The patient is not nervous/anxious.     As per HPI. Otherwise, a complete review of systems is negative.  PAST MEDICAL HISTORY: Past Medical History:  Diagnosis Date   Abnormal ECG    Anemia    Anxiety    Arthritis    Asthma    B12 deficiency    Cardiomyopathy (HCC)    Chronic abdominal pain    Complication of anesthesia    difficulty to get sedated during endoscopy   COVID-19 01/18/2019   DDD (degenerative disc disease), lumbar    Depression    Diabetes mellitus without complication (HCC)    ONLY gestational diabetes, does not have chronic diabetes   Dysrhythmia    Family history of adverse reaction to anesthesia    brother woke during surgery   GERD (gastroesophageal reflux disease)    History of kidney stones    Iron  deficiency    Migraine headache    Neuropathy    Obesity    gastric bypass 2009   Ovarian cyst    Pelvic mass    Pneumonia    within past five years   PTSD (post-traumatic stress disorder)    PTSD (post-traumatic stress disorder)    Rh negative status during pregnancy    Seizures (HCC)    post-gestational   Small bowel obstruction (HCC)    Vitamin D  deficiency  PAST SURGICAL HISTORY: Past Surgical History:  Procedure Laterality Date   ABDOMINAL HYSTERECTOMY     BILATERAL SALPINGECTOMY Bilateral 09/29/2015   Procedure: BILATERAL SALPINGECTOMY;  Surgeon: Heather Penton, MD;  Location: [facility];  Service: Gynecology;  Laterality: Bilateral;   bowel obstruction  2011   CHOLECYSTECTOMY     COLONOSCOPY WITH PROPOFOL  N/A 01/24/2018   Procedure: COLONOSCOPY WITH PROPOFOL ;   Surgeon: Toledo, Ladell POUR, MD;  Location: [facility];  Service: Gastroenterology;  Laterality: N/A;   DILATION AND CURETTAGE OF UTERUS     ESOPHAGOGASTRODUODENOSCOPY     ESOPHAGOGASTRODUODENOSCOPY (EGD) WITH PROPOFOL  N/A 01/24/2018   Procedure: ESOPHAGOGASTRODUODENOSCOPY (EGD) WITH PROPOFOL ;  Surgeon: Toledo, Ladell POUR, MD;  Location: [facility];  Service: Gastroenterology;  Laterality: N/A;   GASTRIC BYPASS  2010   gi bleed  2009   Surgery-was in ICU for three weeks   HERNIA REPAIR     OVARIAN CYST REMOVAL Left 09/29/2015   Procedure: OVARIAN CYSTECTOMY;  Surgeon: Heather Penton, MD;  Location: [facility];  Service: Gynecology;  Laterality: Left;   ROBOTIC ASSISTED BILATERAL SALPINGO OOPHERECTOMY Bilateral 04/01/2023   Procedure: XI ROBOTIC ASSISTED BILATERAL SALPINGO OOPHORECTOMY,;  Surgeon: Penton Heather, MD;  Location: [facility];  Service: Gynecology;  Laterality: Bilateral;   ROBOTIC ASSISTED LAPAROSCOPIC LYSIS OF ADHESION N/A 04/01/2023   Procedure: XI ROBOTIC ASSISTED LAPAROSCOPIC LYSIS OF ADHESION;  Surgeon: Penton Heather, MD;  Location: [facility];  Service: Gynecology;  Laterality: N/A;   ROUX-EN-Y PROCEDURE     VAGINAL HYSTERECTOMY N/A 09/29/2015   Procedure: HYSTERECTOMY VAGINAL;  Surgeon: Heather Penton, MD;  Location: [facility];  Service: Gynecology;  Laterality: N/A;   XI ROBOT ASSISTED DIAGNOSTIC LAPAROSCOPY N/A 04/01/2023   Procedure: XI ROBOT ASSISTED DIAGNOSTIC LAPAROSCOPY, POSSIBLE EXCISION OF ENDOMETRIOSIS, REMOVAL OF PELVIC MASS;  Surgeon: Penton Heather, MD;  Location: [facility];  Service: Gynecology;  Laterality: N/A;    FAMILY HISTORY Family History  Problem Relation Age of Onset   Arthritis/Rheumatoid Mother    Heart block Mother    Clotting disorder Mother    Osteoporosis Mother    Heart attack Mother    Anxiety disorder Mother    Depression Mother    Colon polyps Mother    Stomach cancer Father 72   Heart disease Father    Heart attack Father     Depression Sister    Anxiety disorder Sister    Colon polyps Sister    Colon cancer Neg Hx    Esophageal cancer Neg Hx    Pancreatic cancer Neg Hx        ADVANCED DIRECTIVES:    HEALTH MAINTENANCE: Social History   Tobacco Use   Smoking status: Never   Smokeless tobacco: Never  Vaping Use   Vaping status: Never Used  Substance Use Topics   Alcohol use: No    Alcohol/week: 0.0 standard drinks of alcohol   Drug use: No     Colonoscopy:  PAP:  Bone density:  Lipid panel:  Allergies  Allergen Reactions   Amoxicillin Anaphylaxis, Hives, Shortness Of Breath, Swelling and Rash    Has patient had a PCN reaction causing immediate rash, facial/tongue/throat swelling, SOB or lightheadedness with hypotension: Yes Has patient had a PCN reaction causing severe rash involving mucus membranes or skin necrosis: Yes Has patient had a PCN reaction that required hospitalization No Has patient had a PCN reaction occurring within the last 10 years: No If all of the above answers are NO, then may proceed with Cephalosporin use.  Penicillins Anaphylaxis, Hives, Shortness Of Breath, Swelling and Rash    Has patient had a PCN reaction causing immediate rash, facial/tongue/throat swelling, SOB or lightheadedness with hypotension: Yes Has patient had a PCN reaction causing severe rash involving mucus membranes or skin necrosis: Yes Has patient had a PCN reaction that required hospitalization No Has patient had a PCN reaction occurring within the last 10 years: No If all of the above answers are NO, then may proceed with Cephalosporin use.   Morphine Other (See Comments)    Muscle spasms   Sulfa Antibiotics Nausea And Vomiting    Current Outpatient Medications  Medication Sig Dispense Refill   acetaminophen  (TYLENOL ) 325 MG tablet Take 650 mg by mouth every 4 (four) hours as needed for moderate pain.      ALPRAZolam  (XANAX ) 0.25 MG tablet TAKE 1 TABLET BY MOUTH EVERY DAY AS NEEDED FOR  UP TO 3 DAYS FOR ANXIETY     buPROPion  (WELLBUTRIN  XL) 150 MG 24 hr tablet Take 1 tablet (150 mg total) by mouth daily. 90 tablet 1   busPIRone  (BUSPAR ) 10 MG tablet Take 0.5-1 tablets (5-10 mg total) by mouth daily as needed. 30 tablet 1   cyanocobalamin  (VITAMIN B12) 1000 MCG/ML injection Please administer 1000 mcg injection every 3 days 10 mL 4   cyanocobalamin  (VITAMIN B12) 1000 MCG/ML injection Inject 1 mL (1,000 mcg total) into the muscle every 3 (three) days. 30 mL 1   diphenoxylate -atropine  (LOMOTIL ) 2.5-0.025 MG tablet Take 1 tablet by mouth as needed. 60 tablet 0   estradiol (VIVELLE-DOT) 0.1 MG/24HR patch Place 1 patch onto the skin 2 (two) times a week.     famotidine  (PEPCID ) 20 MG tablet TAKE 1 TABLET BY MOUTH AT BEDTIME 30 tablet 0   HYDROcodone-acetaminophen  (NORCO) 7.5-325 MG tablet Take 1 tablet by mouth every 6 (six) hours as needed for moderate pain.     hydrOXYzine  (ATARAX ) 50 MG tablet TAKE 1 TABLET(50 MG) BY MOUTH TWICE DAILY AS NEEDED 60 tablet 2   Multiple Vitamins-Minerals (HM MULTIVITAMIN ADULT GUMMY) CHEW Chew 2 tablets by mouth daily.     pantoprazole  (PROTONIX ) 40 MG tablet Take 1 tablet (40 mg total) by mouth daily. 30 tablet 2   SUMAtriptan  (IMITREX ) 100 MG tablet Take 100 mg by mouth every 2 (two) hours as needed for migraine or headache.      topiramate  (TOPAMAX ) 50 MG tablet Take 100 mg by mouth 2 (two) times daily.     VENTOLIN HFA 108 (90 Base) MCG/ACT inhaler Inhale 2 puffs into the lungs every 4 (four) hours as needed.     Vilazodone  HCl (VIIBRYD ) 40 MG TABS TAKE 1 TABLET BY MOUTH DAILY 90 tablet 3   Vitamin D , Ergocalciferol , (DRISDOL ) 1.25 MG (50000 UNIT) CAPS capsule TAKE 1 CAPSULE BY MOUTH ONCE DAILY 30 capsule 3   zolpidem  (AMBIEN  CR) 12.5 MG CR tablet Take 1 tablet (12.5 mg total) by mouth at bedtime. 30 tablet 2   No current facility-administered medications for this visit.    OBJECTIVE: There were no vitals filed for this visit.    There is no  height or weight on file to calculate BMI.    ECOG FS:0 - Asymptomatic  General: Well-developed, well-nourished, no acute distress. Eyes: Pink conjunctiva, anicteric sclera. HEENT: Normocephalic, moist mucous membranes. Lungs: No audible wheezing or coughing. Heart: Regular rate and rhythm. Abdomen: Soft, nontender, no obvious distention. Musculoskeletal: No edema, cyanosis, or clubbing. Neuro: Alert, answering all questions appropriately. Cranial nerves grossly intact.  Skin: No rashes or petechiae noted. Psych: Normal affect.  LAB RESULTS:  Lab Results  Component Value Date   NA 140 10/03/2023   K 4.4 10/03/2023   CL 104 10/03/2023   CO2 27 10/03/2023   GLUCOSE 86 10/03/2023   BUN 12 10/03/2023   CREATININE 0.70 10/03/2023   CALCIUM 9.2 10/03/2023   PROT 7.5 10/03/2023   ALBUMIN 4.4 10/03/2023   AST 22 10/03/2023   ALT 29 10/03/2023   ALKPHOS 27 (L) 10/03/2023   BILITOT 0.4 10/03/2023   GFRNONAA >60 04/05/2023   GFRAA >60 04/26/2018    Lab Results  Component Value Date   WBC 6.4 10/25/2023   NEUTROABS 4.3 10/25/2023   HGB 14.0 10/25/2023   HCT 43.3 10/25/2023   MCV 91.5 10/25/2023   PLT 308 10/25/2023   Lab Results  Component Value Date   FERRITIN 22 10/25/2023   Lab Results  Component Value Date   IRON  92 10/25/2023   TIBC 419 10/25/2023   IRONPCTSAT 22 10/25/2023      STUDIES: No results found.   ASSESSMENT: Iron  deficiency anemia.  PLAN:    Iron  deficiency anemia: Resolved.  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  Patient's hemoglobin and iron  stores continue to be within normal limits.  She does not require additional IV Venofer .  Patient last received treatment on March 03, 2023.  No intervention is needed at this time.  Return to clinic in 4 months with repeat laboratory work and video-assisted telemedicine visit.   Rheumatoid arthritis: Monitoring treatment per rheumatology. B12 deficiency: Patient continues  with B12 supplementation. Depression/anxiety: Continue follow-up with primary care as scheduled.  I provided 20 minutes of face-to-face video visit time during this encounter which included chart review, counseling, and coordination of care as documented above.    Patient expressed understanding and was in agreement with this plan. She also understands that She can call clinic at any time with any questions, concerns, or complaints.     Evalene JINNY Reusing, MD   10/26/2023 2:10 PM

## 2023-10-26 NOTE — Progress Notes (Signed)
 Patient was recently put on vitamin b12 and was told that her vitamin D  is low. She is feeling fatigued.

## 2023-10-29 ENCOUNTER — Other Ambulatory Visit: Payer: Self-pay | Admitting: Psychiatry

## 2023-10-29 DIAGNOSIS — F410 Panic disorder [episodic paroxysmal anxiety] without agoraphobia: Secondary | ICD-10-CM

## 2023-10-30 ENCOUNTER — Other Ambulatory Visit: Payer: Self-pay | Admitting: Psychiatry

## 2023-10-30 DIAGNOSIS — F410 Panic disorder [episodic paroxysmal anxiety] without agoraphobia: Secondary | ICD-10-CM

## 2023-11-17 ENCOUNTER — Ambulatory Visit: Attending: Cardiology | Admitting: Internal Medicine

## 2023-11-17 ENCOUNTER — Encounter: Payer: Self-pay | Admitting: Internal Medicine

## 2023-11-17 VITALS — BP 110/60 | HR 84 | Ht 67.0 in | Wt 143.0 lb

## 2023-11-17 DIAGNOSIS — R002 Palpitations: Secondary | ICD-10-CM | POA: Insufficient documentation

## 2023-11-17 DIAGNOSIS — R55 Syncope and collapse: Secondary | ICD-10-CM | POA: Insufficient documentation

## 2023-11-17 DIAGNOSIS — E785 Hyperlipidemia, unspecified: Secondary | ICD-10-CM | POA: Diagnosis present

## 2023-11-17 DIAGNOSIS — R079 Chest pain, unspecified: Secondary | ICD-10-CM | POA: Insufficient documentation

## 2023-11-17 DIAGNOSIS — R9439 Abnormal result of other cardiovascular function study: Secondary | ICD-10-CM | POA: Insufficient documentation

## 2023-11-17 MED ORDER — ASPIRIN 81 MG PO TBEC: 81.0000 mg | DELAYED_RELEASE_TABLET | Freq: Every day | ORAL | 0 refills | Status: DC

## 2023-11-17 MED ORDER — METOPROLOL SUCCINATE ER 25 MG PO TB24: 25.0000 mg | ORAL_TABLET | Freq: Every day | ORAL | 1 refills | Status: DC

## 2023-11-17 MED ORDER — ATORVASTATIN CALCIUM 20 MG PO TABS: 20.0000 mg | ORAL_TABLET | Freq: Every day | ORAL | 3 refills | Status: DC

## 2023-11-17 NOTE — Patient Instructions (Addendum)
 Medication Instructions:   START ASPIRIN 81 MG ONCE DAILY  START ATORVASTATIN 20 MG ONCE DAILY  START METOPROLOL SUCC ER 25 MG ONCE DAILY AT BEDTIME  *If you need a refill on your cardiac medications before your next appointment, please call your pharmacy*  Testing/Procedures:  Your physician has requested that you have a cardiac catheterization. Cardiac catheterization is used to diagnose and/or treat various heart conditions. Doctors may recommend this procedure for a number of different reasons. The most common reason is to evaluate chest pain. Chest pain can be a symptom of coronary artery disease (CAD), and cardiac catheterization can show whether plaque is narrowing or blocking your heart's arteries. This procedure is also used to evaluate the valves, as well as measure the blood flow and oxygen levels in different parts of your heart. For further information please visit [email or web address] Please follow instruction sheet, as given.     Your physician has requested that you have an echocardiogram. Echocardiography is a painless test that uses sound waves to create images of your heart. It provides your doctor with information about the size and shape of your heart and how well your heart's chambers and valves are working. This procedure takes approximately one hour. There are no restrictions for this procedure. Please do NOT wear cologne, perfume, aftershave, or lotions (deodorant is allowed). Please arrive 15 minutes prior to your appointment time.  Please note: We ask at that you not bring children with you during ultrasound (echo/ vascular) testing. Due to room size and safety concerns, children are not allowed in the ultrasound rooms during exams. Our front office staff cannot provide observation of children in our lobby area while testing is being conducted. An adult accompanying a patient to their appointment will only be allowed in the ultrasound room at the discretion of the  ultrasound technician under special circumstances. We apologize for any inconvenience. [address] Follow-Up: At [facility], you and your health needs are our priority.  As part of our continuing mission to provide you with exceptional heart care, our providers are all part of one team.  This team includes your primary Cardiologist (physician) and Advanced Practice Providers or APPs (Physician Assistants and Nurse Practitioners) who all work together to provide you with the care you need, when you need it.  Your next appointment:   1 month(s)  Provider:   EMELINE CALENDER   Other InstructioN      Cardiac Catheterization   You are scheduled for a Cardiac Catheterization on Tuesday, October 7 with Dr. Peter [place].  1. Please arrive at the [facility] (Main Entrance A) at [facility]: [address] [place], [place] [address] at 10:00 AM (This time is 2 hour(s) before your procedure to ensure your preparation).   Free valet parking service is available. You will check in at ADMITTING. The support person will be asked to wait in the waiting room.  It is OK to have someone drop you off and come back when you are ready to be discharged.        Special note: Every effort is made to have your procedure done on time. Please understand that emergencies sometimes delay scheduled procedures.  2. Diet: Light meals may be eaten up to 6 hours before scheduled procedures from 12N and after; please stop eating at 6:00 AM   Light meal consist of plain toast, fruit, light soups, crackers.  3. Hydration:You need to be well hydrated before your procedure. On October 7, you  may drink approved liquids (see below) until 2 hours before the procedure, with 16 oz of water as your last intake.   List of approved liquids water, clear juice, clear tea, black coffee, fruit juices, non-citric and without pulp, carbonated beverages, Gatorade, Kool -Aid, plain Jello-O and plain ice popsicles.  4.  Labs: You will need to have blood drawn on TODAY   5. Medication instructions in preparation for your procedure:   On the morning of your procedure, take Aspirin 81 mg and any morning medicines.  You may use sips of water.  6. Plan to go home the same day, you will only stay overnight if medically necessary. 7. You MUST have a responsible adult to drive you home. 8. An adult MUST be with you the first 24 hours after you arrive home. 9. Bring a current list of your medications, and the last time and date medication taken. 10. Bring ID and current insurance cards. 11.Please wear clothes that are easy to get on and off and wear slip-on shoes.  Thank you for allowing us  to care for you!   -- [facility]

## 2023-11-17 NOTE — Addendum Note (Signed)
 Addended by: KRISTE EMELINE BRAVO on: 11/17/2023 09:50 AM   Modules accepted: Orders

## 2023-11-17 NOTE — H&P (View-Only) (Signed)
 Cardiology Office Note   Date:  11/17/2023  ID:  [name], DOB April 04, 1975, MRN [id number] PCP: Loring Tanda Mae, MD  [facility] Providers Cardiologist:  None Electrophysiologist:  Will Gladis Norton, MD     History of Present Illness [name] is a 48 y.o. female history of Roux-en-Y gastric bypass, small fiber neuropathy, chronic migraine, B12 deficiency, GERD, SBO iron  deficiency anemia who was initially seen on 08/08/2023 by Dr. Norton for chest pain, palpitations and shortness of breath which had been longstanding but worsened several months prior to the appointment.  She underwent PET/CT for CAD evaluation and 2-week monitor for palpitations and returns today to discuss an abnormal PET/CT.  Patient reports ovarian surgery at an outside facility in June of this year followed by postop pneumonia.  She had chest discomfort at that time as well but was told this was from the pneumonia.  Since that time she has been suffering with intermittent chest discomfort with and without exertion but is more characteristic of neck and shoulder discomfort.  Her more concerning symptoms are palpitations which even wake her up at night and occur very frequently.  She had a 2-week monitor that showed sinus tachycardia.  She is also just felt chronically unwell for several months.  She denies any tobacco use.  Family history of CAD in father.    ROS:  Review of Systems  All other systems reviewed and are negative.   Physical Exam  Physical Exam Vitals and nursing note reviewed.  Constitutional:      Appearance: Normal appearance.  HENT:     Head: Normocephalic and atraumatic.  Eyes:     Conjunctiva/sclera: Conjunctivae normal.  Neck:     Vascular: No carotid bruit.  Cardiovascular:     Rate and Rhythm: Normal rate and regular rhythm.  Pulmonary:     Effort: Pulmonary effort is normal.     Breath sounds: Normal breath sounds.  Musculoskeletal:         General: No swelling or tenderness.  Skin:    Coloration: Skin is not jaundiced or pale.  Neurological:     Mental Status: She is alert.     VS:  BP 110/60 (BP Location: Right Arm, Patient Position: Sitting, Cuff Size: Normal)   Pulse 84   Ht 5' 7 (1.702 m)   Wt 143 lb (64.9 kg)   LMP  (LMP Unknown)   SpO2 95%   BMI 22.40 kg/m         Wt Readings from Last 3 Encounters:  11/17/23 143 lb (64.9 kg)  10/03/23 141 lb 4 oz (64.1 kg)  08/08/23 150 lb (68 kg)     EKG Interpretation Date/Time:  Thursday November 17 2023 08:50:35 EDT Ventricular Rate:  86 PR Interval:  102 QRS Duration:  90 QT Interval:  358 QTC Calculation: 428 R Axis:   68  Text Interpretation: Sinus rhythm with short PR ST & T wave abnormality, consider inferolateral ischemia When compared with ECG of 08-Aug-2023 09:45, T wave inversion more evident in Inferior leads Confirmed by Kriste Hicks [phone]) on 11/17/2023 9:01:08 AM    Studies Reviewed   PET/CT 09/17/2023:   Findings are consistent with anterior ischemia with no LAD calcifications and normal blood flow reserve. The study is intermediate risk.  Defect 1: There is a medium defect with moderate reduction in uptake present in the apical to basal anterior location(s) that is reversible. There is abnormal wall motion in the defect area. Consistent with  ischemia.   End diastolic cavity size is normal. End systolic cavity size is normal.   Global myocardial blood flow reserve was 2.35 and was normal.   Coronary calcium was absent on the attenuation correction CT images.  2-week event monitor 09/07/2023: HR 45-1 66 with average 78 bpm No atrial fibrillation Rare supraventricular ectopy No sustained arrhythmias Symptom triggered episodes correspond to sinus rhythm/sinus tachycardia   Risk Assessment/Calculations             ASSESSMENT  Atypical chest pain with abnormal PET/CT PET/CT shows ischemia in the basal to apical anterior territory with no  LAD calcifications and normal blood flow reserve.  Intermediate risk Palpitations secondary to sinus tachycardia Complications to anesthesia patient reports difficulty with obtaining sedation/analgesia in the past requiring high doses of anesthesia and potentially intubation.  She is very nervous about this and appropriately wishes to really this to the Cath Lab team  Plan  Given the abnormal PET/CT and symptomatology, will pursue left heart catheterization for definitive ischemic evaluation.  Risks and benefits of the study were discussed and patient is in agreement with pursuing invasive angiography Echocardiogram Lipid panel Start ASA Start atorvastatin 20 mg Start Toprol XL 25  Follow up: 1 month     Informed Consent   Shared Decision Making/Informed Consent The risks [stroke (1 in 1000), death (1 in 1000), kidney failure [usually temporary] (1 in 500), bleeding (1 in 200), allergic reaction [possibly serious] (1 in 200)], benefits (diagnostic support and management of coronary artery disease) and alternatives of a cardiac catheterization were discussed in detail with [name] and she is willing to proceed.       Signed, Emeline FORBES Calender, MD

## 2023-11-17 NOTE — Progress Notes (Signed)
 Cardiology Office Note   Date:  11/17/2023  ID:  [name], DOB April 04, 1975, MRN [id number] PCP: Elizabeth Tanda Mae, MD  [facility] Providers Cardiologist:  None Electrophysiologist:  Elizabeth Gladis Norton, MD     History of Present Illness [name] is a 48 y.o. female history of Roux-en-Y gastric bypass, small fiber neuropathy, chronic migraine, B12 deficiency, GERD, SBO iron  deficiency anemia who was initially seen on 08/08/2023 by Dr. Norton for chest pain, palpitations and shortness of breath which had been longstanding but worsened several months prior to the appointment.  She underwent PET/CT for CAD evaluation and 2-week monitor for palpitations and returns today to discuss an abnormal PET/CT.  Patient reports ovarian surgery at an outside facility in June of this year followed by postop pneumonia.  She had chest discomfort at that time as well but was told this was from the pneumonia.  Since that time she has been suffering with intermittent chest discomfort with and without exertion but is more characteristic of neck and shoulder discomfort.  Her more concerning symptoms are palpitations which even wake her up at night and occur very frequently.  She had a 2-week monitor that showed sinus tachycardia.  She is also just felt chronically unwell for several months.  She denies any tobacco use.  Family history of CAD in father.    ROS:  Review of Systems  All other systems reviewed and are negative.   Physical Exam  Physical Exam Vitals and nursing note reviewed.  Constitutional:      Appearance: Normal appearance.  HENT:     Head: Normocephalic and atraumatic.  Eyes:     Conjunctiva/sclera: Conjunctivae normal.  Neck:     Vascular: No carotid bruit.  Cardiovascular:     Rate and Rhythm: Normal rate and regular rhythm.  Pulmonary:     Effort: Pulmonary effort is normal.     Breath sounds: Normal breath sounds.  Musculoskeletal:         General: No swelling or tenderness.  Skin:    Coloration: Skin is not jaundiced or pale.  Neurological:     Mental Status: She is alert.     VS:  BP 110/60 (BP Location: Right Arm, Patient Position: Sitting, Cuff Size: Normal)   Pulse 84   Ht 5' 7 (1.702 m)   Wt 143 lb (64.9 kg)   LMP  (LMP Unknown)   SpO2 95%   BMI 22.40 kg/m         Wt Readings from Last 3 Encounters:  11/17/23 143 lb (64.9 kg)  10/03/23 141 lb 4 oz (64.1 kg)  08/08/23 150 lb (68 kg)     EKG Interpretation Date/Time:  Thursday November 17 2023 08:50:35 EDT Ventricular Rate:  86 PR Interval:  102 QRS Duration:  90 QT Interval:  358 QTC Calculation: 428 R Axis:   68  Text Interpretation: Sinus rhythm with short PR ST & T wave abnormality, consider inferolateral ischemia When compared with ECG of 08-Aug-2023 09:45, T wave inversion more evident in Inferior leads Confirmed by [name] [phone]) on 11/17/2023 9:01:08 AM    Studies Reviewed   PET/CT 09/17/2023:   Findings are consistent with anterior ischemia with no LAD calcifications and normal blood flow reserve. The study is intermediate risk.  Defect 1: There is a medium defect with moderate reduction in uptake present in the apical to basal anterior location(s) that is reversible. There is abnormal wall motion in the defect area. Consistent with  ischemia.   End diastolic cavity size is normal. End systolic cavity size is normal.   Global myocardial blood flow reserve was 2.35 and was normal.   Coronary calcium was absent on the attenuation correction CT images.  2-week event monitor 09/07/2023: HR 45-1 66 with average 78 bpm No atrial fibrillation Rare supraventricular ectopy No sustained arrhythmias Symptom triggered episodes correspond to sinus rhythm/sinus tachycardia   Risk Assessment/Calculations             ASSESSMENT  Atypical chest pain with abnormal PET/CT PET/CT shows ischemia in the basal to apical anterior territory with no  LAD calcifications and normal blood flow reserve.  Intermediate risk Palpitations secondary to sinus tachycardia Complications to anesthesia patient reports difficulty with obtaining sedation/analgesia in the past requiring high doses of anesthesia and potentially intubation.  She is very nervous about this and appropriately wishes to really this to the Cath Lab team  Plan  Given the abnormal PET/CT and symptomatology, Elizabeth pursue left heart catheterization for definitive ischemic evaluation.  Risks and benefits of the study were discussed and patient is in agreement with pursuing invasive angiography Echocardiogram Lipid panel Start ASA Start atorvastatin 20 mg Start Toprol XL 25  Follow up: 1 month     Informed Consent   Shared Decision Making/Informed Consent The risks [stroke (1 in 1000), death (1 in 1000), kidney failure [usually temporary] (1 in 500), bleeding (1 in 200), allergic reaction [possibly serious] (1 in 200)], benefits (diagnostic support and management of coronary artery disease) and alternatives of a cardiac catheterization were discussed in detail with [name] and she is willing to proceed.       Signed, [name] Calender, MD

## 2023-11-18 LAB — LIPID PANEL
Chol/HDL Ratio: 1.6 ratio (ref 0.0–4.4)
Cholesterol, Total: 160 mg/dL (ref 100–199)
HDL: 98 mg/dL (ref >39)
LDL Chol Calc (NIH): 49 mg/dL (ref 0–99)
Triglycerides: 64 mg/dL (ref 0–149)
VLDL Cholesterol Cal: 13 mg/dL (ref 5–40)

## 2023-11-18 LAB — CBC
Hematocrit: 42.8 % (ref 34.0–46.6)
Hemoglobin: 13.6 g/dL (ref 11.1–15.9)
MCH: 29.2 pg (ref 26.6–33.0)
MCHC: 31.8 g/dL (ref 31.5–35.7)
MCV: 92 fL (ref 79–97)
Platelets: 337 x10E3/uL (ref 150–450)
RBC: 4.66 x10E6/uL (ref 3.77–5.28)
RDW: 11.9 % (ref 11.7–15.4)
WBC: 4.8 x10E3/uL (ref 3.4–10.8)

## 2023-11-18 LAB — BASIC METABOLIC PANEL WITH GFR
BUN/Creatinine Ratio: 17 (ref 9–23)
BUN: 13 mg/dL (ref 6–24)
CO2: 23 mmol/L (ref 20–29)
Calcium: 9.3 mg/dL (ref 8.7–10.2)
Chloride: 105 mmol/L (ref 96–106)
Creatinine, Ser: 0.77 mg/dL (ref 0.57–1.00)
Glucose: 82 mg/dL (ref 70–99)
Potassium: 3.8 mmol/L (ref 3.5–5.2)
Sodium: 141 mmol/L (ref 134–144)
eGFR: 97 mL/min/1.73 (ref >59)

## 2023-11-21 ENCOUNTER — Telehealth: Payer: Self-pay | Admitting: *Deleted

## 2023-11-21 NOTE — Telephone Encounter (Addendum)
 Cardiac Catheterization scheduled at [facility] for: Tuesday November 22, 2023 12 Noon Arrival time [facility] Main Entrance A at: 10 AM  Diet: -May have light meal until 6 AM. (6 hours before procedure time) Approved light meal consists of plain toast, fruit, light soups, crackers.  Hydration: -May drink clear liquids until 2 hours before the procedure.  Approved liquids: Water, clear tea, black coffee, fruit juices-non-citric and without pulp,Gatorade, plain Jello/popsicles.   -Please drink 16 oz of water 2 hours before procedure.  Medication instructions: -Usual morning medications can be taken including aspirin 81 mg.  Plan to go home the same day, you will only stay overnight if medically necessary.  You must have responsible adult to drive you home.  Someone must be with you the first 24 hours after you arrive home.  Reviewed procedure instructions with patient.

## 2023-11-22 ENCOUNTER — Ambulatory Visit (HOSPITAL_COMMUNITY)
Admission: RE | Admit: 2023-11-22 | Discharge: 2023-11-22 | Disposition: A | Discharge status: Home / Self Care | Attending: Cardiology | Admitting: Cardiology

## 2023-11-22 ENCOUNTER — Encounter (HOSPITAL_COMMUNITY): Payer: Self-pay | Admitting: Cardiology

## 2023-11-22 ENCOUNTER — Encounter (HOSPITAL_COMMUNITY)
Admission: RE | Disposition: A | Discharge status: Home / Self Care | Payer: Self-pay | Source: Home / Self Care | Attending: Cardiology

## 2023-11-22 ENCOUNTER — Other Ambulatory Visit: Payer: Self-pay | Admitting: Psychiatry

## 2023-11-22 ENCOUNTER — Other Ambulatory Visit: Payer: Self-pay

## 2023-11-22 DIAGNOSIS — G629 Polyneuropathy, unspecified: Secondary | ICD-10-CM | POA: Diagnosis not present

## 2023-11-22 DIAGNOSIS — R000 Tachycardia, unspecified: Secondary | ICD-10-CM | POA: Diagnosis not present

## 2023-11-22 DIAGNOSIS — E538 Deficiency of other specified B group vitamins: Secondary | ICD-10-CM | POA: Diagnosis not present

## 2023-11-22 DIAGNOSIS — D509 Iron deficiency anemia, unspecified: Secondary | ICD-10-CM | POA: Insufficient documentation

## 2023-11-22 DIAGNOSIS — R079 Chest pain, unspecified: Secondary | ICD-10-CM | POA: Diagnosis not present

## 2023-11-22 DIAGNOSIS — F5101 Primary insomnia: Secondary | ICD-10-CM

## 2023-11-22 DIAGNOSIS — R002 Palpitations: Secondary | ICD-10-CM | POA: Diagnosis not present

## 2023-11-22 DIAGNOSIS — Z8249 Family history of ischemic heart disease and other diseases of the circulatory system: Secondary | ICD-10-CM | POA: Insufficient documentation

## 2023-11-22 DIAGNOSIS — K219 Gastro-esophageal reflux disease without esophagitis: Secondary | ICD-10-CM | POA: Insufficient documentation

## 2023-11-22 DIAGNOSIS — R9439 Abnormal result of other cardiovascular function study: Secondary | ICD-10-CM

## 2023-11-22 DIAGNOSIS — Z7982 Long term (current) use of aspirin: Secondary | ICD-10-CM | POA: Diagnosis not present

## 2023-11-22 DIAGNOSIS — Z9884 Bariatric surgery status: Secondary | ICD-10-CM | POA: Insufficient documentation

## 2023-11-22 DIAGNOSIS — Z79899 Other long term (current) drug therapy: Secondary | ICD-10-CM | POA: Insufficient documentation

## 2023-11-22 HISTORY — PX: LEFT HEART CATH AND CORONARY ANGIOGRAPHY: CATH118249

## 2023-11-22 SURGERY — LEFT HEART CATH AND CORONARY ANGIOGRAPHY
Anesthesia: LOCAL

## 2023-11-22 MED ORDER — DIPHENHYDRAMINE HCL 50 MG/ML IJ SOLN
INTRAMUSCULAR | Status: DC | PRN
  Administered 2023-11-22: 25 mg via INTRAVENOUS

## 2023-11-22 MED ORDER — SODIUM CHLORIDE 0.9 % IV BOLUS
250.0000 mL | Freq: Once | INTRAVENOUS | Status: AC
  Administered 2023-11-22: 250 mL via INTRAVENOUS

## 2023-11-22 MED ORDER — SODIUM CHLORIDE 0.9% FLUSH: 3.0000 mL | INTRAVENOUS | Status: DC | PRN

## 2023-11-22 MED ORDER — ONDANSETRON HCL 4 MG/2ML IJ SOLN
4.0000 mg | Freq: Four times a day (QID) | INTRAMUSCULAR | Status: DC | PRN
  Administered 2023-11-22: 4 mg via INTRAVENOUS
  Filled 2023-11-22: qty 2

## 2023-11-22 MED ORDER — HEPARIN (PORCINE) IN NACL 1000-0.9 UT/500ML-% IV SOLN
INTRAVENOUS | Status: DC | PRN
  Administered 2023-11-22: 1000 mL via SURGICAL_CAVITY

## 2023-11-22 MED ORDER — MIDAZOLAM HCL 2 MG/2ML IJ SOLN
INTRAMUSCULAR | Status: AC
  Filled 2023-11-22: qty 2

## 2023-11-22 MED ORDER — SODIUM CHLORIDE 0.9 % IV SOLN: 250.0000 mL | INTRAVENOUS | Status: DC | PRN

## 2023-11-22 MED ORDER — FENTANYL CITRATE (PF) 100 MCG/2ML IJ SOLN
INTRAMUSCULAR | Status: AC
  Filled 2023-11-22: qty 2

## 2023-11-22 MED ORDER — IOHEXOL 350 MG/ML SOLN
INTRAVENOUS | Status: DC | PRN
  Administered 2023-11-22: 40 mL

## 2023-11-22 MED ORDER — FENTANYL CITRATE (PF) 100 MCG/2ML IJ SOLN
INTRAMUSCULAR | Status: DC | PRN
  Administered 2023-11-22 (×2): 25 ug via INTRAVENOUS

## 2023-11-22 MED ORDER — FREE WATER: 500.0000 mL | Freq: Once | Status: DC

## 2023-11-22 MED ORDER — LABETALOL HCL 5 MG/ML IV SOLN: 10.0000 mg | INTRAVENOUS | Status: DC | PRN

## 2023-11-22 MED ORDER — LIDOCAINE HCL (PF) 1 % IJ SOLN
INTRAMUSCULAR | Status: DC | PRN
  Administered 2023-11-22: 5 mL

## 2023-11-22 MED ORDER — ALBUMIN HUMAN 25 % IV SOLN
25.0000 g | Freq: Once | INTRAVENOUS | Status: AC
  Administered 2023-11-22: 25 g via INTRAVENOUS
  Filled 2023-11-22: qty 100

## 2023-11-22 MED ORDER — DIPHENHYDRAMINE HCL 50 MG/ML IJ SOLN
INTRAMUSCULAR | Status: AC
  Filled 2023-11-22: qty 1

## 2023-11-22 MED ORDER — VERAPAMIL HCL 2.5 MG/ML IV SOLN
INTRAVENOUS | Status: DC | PRN
  Administered 2023-11-22: 10 mL via INTRA_ARTERIAL

## 2023-11-22 MED ORDER — HEPARIN SODIUM (PORCINE) 1000 UNIT/ML IJ SOLN
INTRAMUSCULAR | Status: DC | PRN
  Administered 2023-11-22: 3500 [IU] via INTRAVENOUS

## 2023-11-22 MED ORDER — MIDAZOLAM HCL 2 MG/2ML IJ SOLN
INTRAMUSCULAR | Status: DC | PRN
  Administered 2023-11-22 (×2): 1 mg via INTRAVENOUS

## 2023-11-22 MED ORDER — ACETAMINOPHEN 325 MG PO TABS: 650.0000 mg | ORAL_TABLET | ORAL | Status: DC | PRN

## 2023-11-22 MED ORDER — LIDOCAINE HCL (PF) 1 % IJ SOLN
INTRAMUSCULAR | Status: AC
  Filled 2023-11-22: qty 30

## 2023-11-22 MED ORDER — SODIUM CHLORIDE 0.9% FLUSH: 3.0000 mL | Freq: Two times a day (BID) | INTRAVENOUS | Status: DC

## 2023-11-22 MED ORDER — VERAPAMIL HCL 2.5 MG/ML IV SOLN
INTRAVENOUS | Status: AC
  Filled 2023-11-22: qty 2

## 2023-11-22 MED ORDER — HYDRALAZINE HCL 20 MG/ML IJ SOLN: 10.0000 mg | INTRAMUSCULAR | Status: DC | PRN

## 2023-11-22 MED ORDER — HEPARIN SODIUM (PORCINE) 1000 UNIT/ML IJ SOLN
INTRAMUSCULAR | Status: AC
  Filled 2023-11-22: qty 10

## 2023-11-22 SURGICAL SUPPLY — 8 items
CATH 5FR JL3.5 JR4 ANG PIG MP (CATHETERS) IMPLANT
DEVICE RAD TR BAND REGULAR (VASCULAR PRODUCTS) IMPLANT
GLIDESHEATH SLEND SS 6F .021 (SHEATH) IMPLANT
GUIDEWIRE INQWIRE 1.5J.035X260 (WIRE) IMPLANT
KIT SINGLE USE MANIFOLD (KITS) IMPLANT
PACK CARDIAC CATHETERIZATION (CUSTOM PROCEDURE TRAY) ×2 IMPLANT
SET ATX-X65L (MISCELLANEOUS) IMPLANT
SHEATH PROBE COVER 6X72 (BAG) IMPLANT

## 2023-11-22 NOTE — Progress Notes (Signed)
 Received s/o from Jon Hails and Dr. [place] to f/u patient progress/BP this PM as it was felt that she could likely go home. Got IV albumin earlier. Subsequently gave 250cc IVF bolus x2 + Zofran  4mg . Patient has since ambulated and did a lap in short stay, felt fine without complication. Nausea has improved. I personally reassessed patient. F/u BP 104/56 by autocuff, 106/60 by manual cuff by me. Radial cath site without complication, well appearing. She is comfortable with discharge and has someone to stay with her. Per instructions from colleague team, we have discontinued atorvastatin and metoprolol. She does not think metoprolol helped with initial symptoms. She will notify if BP trends low at home going forward as well. She will keep follow-up as planned.

## 2023-11-22 NOTE — Progress Notes (Addendum)
 Patient BP 66/42(49) patient is feeling nauseous and diaphoretic. NP [facility] notified .  1620 : Albumen given to patient. BP 94/63(72) . Patient is feeling a little better but she still is experiencing nausea.  PA Dunn notified and ordered 250 cc bolus.   1730 post 250 cc bolus manuel BP 98/59, automatic BP 88/53(65). Patient still feels nauseous after Zofran  . PA Dunn ordered another 250cc bolus.  1845 PA Dunn at bedside with patient. Patient is improving. Procedural sites looks intact. Patient is stable for discharge.

## 2023-11-22 NOTE — Interval H&P Note (Signed)
 History and Physical Interval Note:  11/22/2023 1:00 PM  [name]  has presented today for surgery, with the diagnosis of abnormal scan.  The various methods of treatment have been discussed with the patient and family. After consideration of risks, benefits and other options for treatment, the patient has consented to  Procedure(s): LEFT HEART CATH AND CORONARY ANGIOGRAPHY (N/A) as a surgical intervention.  The patient's history has been reviewed, patient examined, no change in status, stable for surgery.  I have reviewed the patient's chart and labs.  Questions were answered to the patient's satisfaction.   Cath Lab Visit (complete for each Cath Lab visit)  Clinical Evaluation Leading to the Procedure:   ACS: No.  Non-ACS:    Anginal Classification: CCS II  Anti-ischemic medical therapy: Minimal Therapy (1 class of medications)  Non-Invasive Test Results: Intermediate-risk stress test findings: cardiac mortality 1-3%/year  Prior CABG: No previous CABG        [facility] 11/22/2023 1:00 PM

## 2023-11-22 NOTE — Discharge Instructions (Signed)
 Radial Site Care  This sheet gives you information about how to care for yourself after your procedure. Your health care provider may also give you more specific instructions. If you have problems or questions, contact your health care provider. What can I expect after the procedure? After the procedure, it is common to have: Bruising and tenderness at the catheter insertion area. Follow these instructions at home: Medicines Take over-the-counter and prescription medicines only as told by your health care provider. Insertion site care Follow instructions from your health care provider about how to take care of your insertion site. Make sure you: Wash your hands with soap and water before you remove your bandage (dressing). If soap and water are not available, use hand sanitizer. May remove dressing in 24 hours. Check your insertion site every day for signs of infection. Check for: Redness, swelling, or pain. Fluid or blood. Pus or a bad smell. Warmth. Do no take baths, swim, or use a hot tub for 5 days. You may shower 24-48 hours after the procedure. Remove the dressing and gently wash the site with plain soap and water. Pat the area dry with a clean towel. Do not rub the site. That could cause bleeding. Do not apply powder or lotion to the site. Activity  For 24 hours after the procedure, or as directed by your health care provider: Do not flex or bend the affected arm. Do not push or pull heavy objects with the affected arm. Do not drive yourself home from the hospital or clinic. You may drive 24 hours after the procedure. Do not operate machinery or power tools. KEEP ARM ELEVATED THE REMAINDER OF THE DAY. Do not push, pull or lift anything that is heavier than 10 lb for 5 days. Ask your health care provider when it is okay to: Return to work or school. Resume usual physical activities or sports. Resume sexual activity. General instructions If the catheter site starts to  bleed, raise your arm and put firm pressure on the site. If the bleeding does not stop, get help right away. This is a medical emergency. DRINK PLENTY OF FLUIDS FOR THE NEXT 2-3 DAYS. No alcohol consumption for 24 hours after receiving sedation. If you went home on the same day as your procedure, a responsible adult should be with you for the first 24 hours after you arrive home. Keep all follow-up visits as told by your health care provider. This is important. Contact a health care provider if: You have a fever. You have redness, swelling, or yellow drainage around your insertion site. Get help right away if: You have unusual pain at the radial site. The catheter insertion area swells very fast. The insertion area is bleeding, and the bleeding does not stop when you hold steady pressure on the area. Your arm or hand becomes pale, cool, tingly, or numb. These symptoms may represent a serious problem that is an emergency. Do not wait to see if the symptoms will go away. Get medical help right away. Call your local emergency services (911 in the [place] Do not drive yourself to the hospital. Summary After the procedure, it is common to have bruising and tenderness at the site. Follow instructions from your health care provider about how to take care of your radial site wound. Check the wound every day for signs of infection.  This information is not intended to replace advice given to you by your health care provider. Make sure you discuss any questions you have with  your health care provider. Document Revised: 03/09/2017 Document Reviewed: 03/09/2017 Elsevier Patient Education  2020 [employer].

## 2023-11-23 ENCOUNTER — Encounter: Payer: Self-pay | Admitting: Internal Medicine

## 2023-12-28 NOTE — Progress Notes (Signed)
 [facility]  FOLLOW UP VISIT    MEDICATION MONITORING PARAMETER DATE COMMENT  PAIN CONTRACT SIGNED: 10/27/22 Reviewed and signed, copy to patient   LAST ATTENDING VISIT 12/30/22 Dr Benancio  MED / [facility] EVAL  Dr. Larinda - COM 2  LAST URINE DRUG SCREEN  07/21/2021 11/04/2021 Urine ([facility]) 04/27/2022  10/27/22   04/04/2023  08/30/2023 Appropriate + opiates  Appropriate hydrocodone and benzo Appropriate hydrocodone and benzo (trial for medication with neurologist) + for norco and oxy due to recent surgery, appropriate Appropriate    [place] Pmp Aware Website ACCESSED [place] 12/30/2023 appropriate    [place] OFFENDER DATABASE ACCESSED [place] Offender Database Website 08/16/2018 no criminal history  CURRENT Morphine MME:    NALOXONE  Rx: 10/27/22 prescribed  H/O ABERRANT BEHAVIORS  none   PRIMARY CARE PROVIDER  Loring Tanda KIDD, MD  12 lead EKG for Methadone / TCA  QTc=  Nortriptyline/ Amitriptyline level    Neuro Stimulator Device  None    Stop Act 08/29/2022 Dr. Benancio    PDMP review:   SUBJECTIVE / INTERVAL HISTORY:    CHIEF COMPLAINT: bilateral hands and feet, right fingers, hand, lower arm worse, low back and right leg.  She is a 48 y.o. old female who  has a past medical history of Abnormal ECG, Anemia, Anxiety, Arthritis (1999), B12 deficiency, Cardiomyopathy, secondary (CMS-HCC), Chronic abdominal pain, unspecified, DDD (degenerative disc disease), Depression, Diabetes mellitus type 2, uncomplicated (CMS-HCC), GERD (gastroesophageal reflux disease), History of headache (2009), Iron  deficiency, Low back pain (1999), Obesity, Ovarian cyst (2009), PTSD (post-traumatic stress disorder), Rh negative status during pregnancy, Seizures (CMS-HCC), and Small bowel obstruction (CMS-HCC).  Surgical Hx: -- SVD x 2  - Laparoscopic surgery 03/2023  - S/p hysterectomy with bilateral salpingectomy. Followed by unintentional weight loss of 23 pounds in the 3 months postop.   - Hx of gastric  bypass, she has lost almost 200# and has kept it off - cholecystectomy  Procedures: Lumbar ESI L4-5 x 2 - Dr. Ibebazebo - [facility]   Interval pain history from 12/30/23: History of Present Illness  [name] is a 48 year old female with chronic low back pain and idiopathic small fiber neuropathy who presents for pain management.  She experiences chronic low back pain that radiates to both legs. Typically, she receives injections every three to six months to manage the pain, but has been unable to attend appointments due to caregiving responsibilities for her parents, both of whom are ill. Consequently, her back pain has been more bothersome recently, and she rates her current pain level as a six out of ten.  She has a history of idiopathic small fiber neuropathy diagnosed in 2016 or 2017, and reports symptoms in both her hands and feet. Various treatments have been attempted, including injections, creams, and different medications, but they have not been effective. She currently takes hydrocodone (Norco) for pain management, which she finds helpful, though it occasionally causes constipation. She also takes Topamax  100 mg twice daily.  Her past medical history includes fibromyalgia and a gastric bypass surgery in 2009, which has led to malabsorption issues and loose bowels. The hydrocodone sometimes helps manage these bowel symptoms.  Pain Medications       Disp Refills Start End   acetaminophen  (TYLENOL ) 325 MG tablet -- -- 04/03/2014 --   Sig - Route: Take 650 mg by mouth every 4 (four) hours as needed - Oral   Class: Historical Med   ALPRAZolam  (XANAX ) 0.25 MG  tablet -- -- 04/22/2022 --   Sig: TAKE 1 TABLET BY MOUTH EVERY DAY AS NEEDED FOR UP TO 3 DAYS FOR ANXIETY   Class: Historical Med   HYDROcodone-acetaminophen  (NORCO) 7.5-325 mg tablet 120 tablet 0 11/03/2023 --   Sig - Route: Take 1 tablet by mouth every 6 (six) hours as needed for Pain - Oral   Class:  Electronic   HYDROcodone-acetaminophen  (NORCO) 7.5-325 mg tablet 120 tablet 0 01/02/2024 --   Sig - Route: Take 1 tablet by mouth every 6 (six) hours as needed for Pain - Oral   Class: Electronic   HYDROcodone-acetaminophen  (NORCO) 7.5-325 mg tablet 120 tablet 0 02/01/2024 --   Sig - Route: Take 1 tablet by mouth every 6 (six) hours as needed for Pain - Oral   Class: Electronic   HYDROcodone-acetaminophen  (NORCO) 7.5-325 mg tablet 120 tablet 0 03/02/2024 --   Sig - Route: Take 1 tablet by mouth every 6 (six) hours as needed for Pain - Oral   Class: Electronic   topiramate  (TOPAMAX ) 50 MG tablet 360 tablet 3 01/11/2023 01/06/2024   Sig - Route: Take 2 tablets (100 mg total) by mouth 2 (two) times daily for 360 days - Oral   Class: Electronic   lamoTRIgine  (LAMICTAL ) 200 MG tablet -- -- 03/14/2023 --   Class: Historical Med   naloxone  (NARCAN ) 4 mg/actuation nasal spray 1 each 0 10/28/2023 10/28/2023   Sig - Route: Place 1 spray (4 mg total) into one nostril once as needed (if not breathing.) for up to 1 dose - Nasal   Class: Electronic      Patient reports continued benefit from these medications including improved quality of life with the ability to remain active and complete various ADL's. Reports no side effects such as nausea, vomiting, constipation, mental status changes, respiratory depression, or itching on current pain medication regimen. Reports to be taking it as prescribed. There is no evidence of aberrancy from the clinic presentation or [place]  Controlled Substances Reporting system.   Prior Ineffective Medications: 1. Neuropathics - Gabapentin , Lyrica 2. Muscle Relaxants - None 3. SNRI - Tramadol 4. TCA - None 5. NSAID - Ibuprofen  6. Alpha 2 agonists - None 7. Topicals - Voltaren gel, compounding cream  8.  Opioids: Tramadol - lack of efficacy due to malabsorption  ------------------------------------------------------------------------------------------ Review of  Systems 12/30/2023 I personally reviewed the systems review  that was completed by the patient and recorded by the nursing staff.  The pertinent postives and negatives for this visit are in a  separate documentation note from today's encounter for list of positive responses.  All other responses to all listed systems are negative.  Patient encouraged to follow up on all positive responses not covered in this visit with their primary care physician.    The following portions of the patient's history were reviewed and updated as appropriate: allergies, current medications, past family history, past medical history, past social history, past surgical history and problem list.  Allergies: Allergies  Allergen Reactions  . Amoxicillin Hives, Shortness Of Breath, Swelling, Rash and Anaphylaxis    Other reaction(s): ANAPHYLAXIS  . Penicillins Rash, Swelling and Anaphylaxis    Other reaction(s): ANAPHYLAXIS  . Morphine Other (See Comments)    Muscle spasms  . Sulfa (Sulfonamide Antibiotics) Nausea    Other reaction(s): VOMITING    Patient Active Problem List  Diagnosis  . Anxiety  . Depression  . Abnormal ECG  . Dizziness, nonspecific  . Palpitation  . Shortness of breath  .  Gestational diabetes mellitus (GDM) in third trimester (HHS-HCC)  . History of gastric bypass  . Achilles bursitis  . Degeneration of lumbar or lumbosacral intervertebral disc  . Lumbosacral spondylosis without myelopathy  . Low back pain  . Mixed urge and stress incontinence  . Obesity  . Chronic pain syndrome  . Disorder of sacrum  . Right upper quadrant abdominal pain  . Vitamin D  deficiency  . Fibroid (bleeding) (uterine)  . History of bariatric surgery  . Birth control counseling  . Contraception management  . Abdominal pain, chronic, generalized  . Pelvic pain in female  . Neuropathy  . Abnormal MRI, cervical spine  . Abnormal uterine bleeding  . Arthralgia of left acromioclavicular joint  .  Arthralgia of shoulder region, left  . Cervical radiculitis  . Cervicogenic headache  . Chronic pain of both feet  . Chronic pain of right lower extremity  . Complications of bariatric procedures  . DDD (degenerative disc disease), cervical  . Disorder of skeletal system  . Enthesopathy of left shoulder  . History of idiopathic intracranial hypertension  . Adjustment disorder  . Iron  deficiency anemia  . Lumbar facet arthropathy  . MDD (major depressive disorder), recurrent episode, moderate (CMS-HCC)  . MDD (major depressive disorder), recurrent, in full remission ()  . Osteoarthritis of left AC (acromioclavicular) joint  . Panic disorder  . Panniculitis  . Idiopathic small fiber peripheral neuropathy  . Vitamin B12 deficiency    Past Medical History:  Diagnosis Date  . Abnormal ECG   . Anemia    Last blood transfusion 05/2012.   SABRA Anxiety   . Arthritis 1999  . B12 deficiency   . Cardiomyopathy, secondary (CMS/HHS-HCC)   . Chronic abdominal pain, unspecified   . DDD (degenerative disc disease)   . Depression   . Diabetes mellitus type 2, uncomplicated (CMS/HHS-HCC)    Type 2- on Insulin  . Endometriosis of uterus   . Fibroid   . GERD (gastroesophageal reflux disease)   . History of blood transfusion   . History of headache 2009  . History of stroke 12/24   Still investigating  . Iron  deficiency   . Low back pain 1999   DDD  . Obesity   . Osteoporosis 2023   Bone density test  . Ovarian cyst 2009  . Pelvic pain in female   . PTSD (post-traumatic stress disorder)   . Rh negative status during pregnancy (HHS-HCC)   . Seizures (CMS/HHS-HCC)    Non epileptic seizures  . Small bowel obstruction (CMS/HHS-HCC)     Past Surgical History:  Procedure Laterality Date  . ROUX-EN-Y PROCEDURE  2009  . BARIATRIC SURGERY  2009  . Surgery for a bowel obstruction  2011  . CHOLECYSTECTOMY  2013  . laparoscopic ovarian cystectomy and hysteroscopic polypectomy  2015  . EGD   05/08/2013  . DILATION AND CURETTAGE OF UTERUS  06/21/2013   w/polypectomy  . EGD N/A 05/06/2014   Procedure: EGD;  Surgeon: Levorn Arlean Fisherman, DO;  [facility];  Service: General Surgery;  Laterality: N/A;  . COLONOSCOPY  01/24/2018   Tortuous colon/Negative Colon biopsy/Repeat 27yrs/TKT  . EGD  01/24/2018   Negative EGD biopsies/No Repeat/TKT  . LAPAROSCOPIC SALPINGO-OOPHORECTOMY Bilateral 04/01/2023  . COLON SURGERY    . HYSTERECTOMY      Family History  Problem Relation Age of Onset  . Myocardial Infarction (Heart attack) Mother   . Pulmonary embolism Mother   . Arthritis Mother   . Colon  polyps Mother   . Depression Mother   . Anxiety Mother   . Rheum arthritis Mother   . Skin cancer Mother   . Colon polyps Sister   . Arthritis Sister   . Asthma Sister   . Depression Sister   . Colon polyps Other        grandmother  . Breast cancer Other   . Lung cancer Other   . Arthritis Father   . High blood pressure (Hypertension) Father   . Depression Father   . Myocardial Infarction (Heart attack) Father   . Stroke Father   . Colon cancer Father   . Coronary Artery Disease (Blocked arteries around heart) Father   . Arthritis Brother   . Arthritis Maternal Grandmother   . Asthma Maternal Grandmother   . Depression Maternal Grandmother   . Lung cancer Maternal Grandmother   . Ovarian cancer Maternal Grandmother 30  . Arthritis Paternal Grandmother   . Breast cancer Paternal Grandmother   . Heart disease Paternal Grandmother   . High blood pressure (Hypertension) Paternal Grandmother   . Depression Paternal Grandmother   . Myocardial Infarction (Heart attack) Paternal Grandmother   . Depression Sister     Social History   Socioeconomic History  . Marital status: Single  Tobacco Use  . Smoking status: Never    Passive exposure: Never  . Smokeless tobacco: Never  Vaping Use  . Vaping status: Never Used  Substance and Sexual Activity  . Alcohol use:  Never    Comment:  1-4 /week   . Drug use: Never  . Sexual activity: Yes    Partners: Male    Birth control/protection: Surgical    Comment: hysterectomy   Social Drivers of Health   Housing Stability: Unknown (03/15/2023)   Housing Stability Vital Sign   . Homeless in the Last Year: No    OBJECTIVE:   Physical Exam: VS:  No VS take, video visit   CONSTITUTIONAL: Pt is awake and interactive CARDIOVASCULAR: appears well perfused  RESPIRATORY: Normal respiratory effort  SKIN:  No visible rashes GASTROINTESTINAL:  NEUROLOGIC:   MMT:   MUSCULOSKELETAL:   PSYCHIATRIC: Appropriate mood and Affect    VISIT DIAGNOSIS:   Encounter Diagnoses  Name Primary?  . Fibromyalgia Yes  . Small fiber neuropathy   . Lumbar radiculopathy   . Chronic pain syndrome   . Chronic, continuous use of opioids   . Lumbosacral radiculopathy      ASSESSMENT AND PLAN:   PLAN:   Discussion:   Patient is a 48 year old female with chronic, worsening pain in her bilateral hands and feet secondary to her diagnosis of small fiber neuropathy referred from Neurology for consultation for pain management    Assessment & Plan  Chronic low back pain with bilateral leg pain (lumbar radiculopathy) Pain rated 6/10. Management complicated by caregiving duties. Hydrocodone effective with occasional constipation, mitigated by gastric bypass history. - Continue hydrocodone. - Scheduled follow-up in 2-3 months.  Idiopathic small fiber neuropathy of hands and feet Diagnosed in 2016 or 2017. Previous treatments ineffective. Hydrocodone provides some relief. - Continue hydrocodone.  Fibromyalgia Managed with hydrocodone and Topamax . Hydrocodone effective but requires regular scheduling for side effects. Topamax  at 100 mg twice daily. - Continue Topamax  100 mg twice daily. - Continue hydrocodone.  Medication management - Continue Norco 7.5/325 mg TID prn  - Continue Topamax  - UDS 08/30/2023 appropriate    The following medications were prescribed with appropriate education.   Requested Prescriptions  Signed Prescriptions Disp Refills  . HYDROcodone-acetaminophen  (NORCO) 7.5-325 mg tablet 120 tablet 0    Sig: Take 1 tablet by mouth every 6 (six) hours as needed for Pain  . HYDROcodone-acetaminophen  (NORCO) 7.5-325 mg tablet 120 tablet 0    Sig: Take 1 tablet by mouth every 6 (six) hours as needed for Pain  . HYDROcodone-acetaminophen  (NORCO) 7.5-325 mg tablet 120 tablet 0    Sig: Take 1 tablet by mouth every 6 (six) hours as needed for Pain    No orders of the defined types were placed in this encounter.    FOLLOW UP PLAN: No follow-ups on file.   [name] has signed the [facility] Pain Management Agreement and verbalized understanding. The risks and benefits were discussed in depth and detail concerning opioid therapy to include the risks of overdose, addiction, tolerance and death. Further, the patient affirms the agreement provisions and comprehends the potential adverse effects of opioid treatment described in the document.  The patient indicates benefit from opioid therapy with reduction in pain and improvement in quality of life.  The patient was counseled regarding the diagnosis and treatment plan.   Prior urine toxicology screening and 12 month prescription history in [place] were reviewed during this visit. Any discrepancies in the [place] are recorded in the encounter note above. The therapeutic use of the targeted controlled substance is for chronic pain and is expected to exceed 30 days.    My overall impression is that is the patient may benefit from comprehensive chronic pain management to include opioid therapy. The benefits of this therapy currently appears to outweigh the risks and side effects, and I think it is reasonable to continue opioid pain medication today. This encounter was conducted under the supervision of Dr. Sheree Dimon and the chart has been electronically  sent to this physician for review and verification that this prescription is medically appropriate today every 90 days while the targeted controlled substance is prescribed.    Future Appointments     Date/Time Provider Department Center Visit Type   12/30/2023 9:00 AM (Arrive by 8:30 AM) Shelton, Ozell Boas, NP [facility] RETURN VISIT   02/07/2024 9:15 AM Maree Jannett Hering, MD [facility] RETURN VISIT   02/28/2024 8:30 AM (Arrive by 8:00 AM) Shawnie Lorenz LABOR, NP [facility] RETURN VISIT      Attestation Statement:   I personally performed the service, non-incident to. (WP)   MICHAEL TODD MESKE, NP

## 2023-12-29 ENCOUNTER — Ambulatory Visit (HOSPITAL_COMMUNITY)
Admission: RE | Admit: 2023-12-29 | Discharge: 2023-12-29 | Disposition: A | Discharge status: Home / Self Care | Source: Ambulatory Visit | Attending: Cardiology | Admitting: Cardiology

## 2023-12-29 DIAGNOSIS — R002 Palpitations: Secondary | ICD-10-CM

## 2023-12-29 DIAGNOSIS — R9439 Abnormal result of other cardiovascular function study: Secondary | ICD-10-CM

## 2023-12-29 DIAGNOSIS — R079 Chest pain, unspecified: Secondary | ICD-10-CM

## 2023-12-29 LAB — ECHOCARDIOGRAM COMPLETE
Area-P 1/2: 12.24 cm2
S' Lateral: 3 cm

## 2024-01-01 ENCOUNTER — Ambulatory Visit: Payer: Self-pay | Admitting: Internal Medicine

## 2024-01-03 ENCOUNTER — Encounter: Payer: Self-pay | Admitting: Psychiatry

## 2024-01-03 ENCOUNTER — Telehealth: Admitting: Psychiatry

## 2024-01-03 DIAGNOSIS — F3342 Major depressive disorder, recurrent, in full remission: Secondary | ICD-10-CM | POA: Diagnosis not present

## 2024-01-03 DIAGNOSIS — F5101 Primary insomnia: Secondary | ICD-10-CM

## 2024-01-03 DIAGNOSIS — F4310 Post-traumatic stress disorder, unspecified: Secondary | ICD-10-CM

## 2024-01-03 DIAGNOSIS — F410 Panic disorder [episodic paroxysmal anxiety] without agoraphobia: Secondary | ICD-10-CM | POA: Diagnosis not present

## 2024-01-03 MED ORDER — BUPROPION HCL ER (XL) 150 MG PO TB24: 150.0000 mg | ORAL_TABLET | Freq: Every day | ORAL | 1 refills | Status: AC

## 2024-01-03 MED ORDER — BUSPIRONE HCL 10 MG PO TABS
5.0000 mg | ORAL_TABLET | Freq: Every day | ORAL | 5 refills | Status: AC | PRN
Start: 2024-01-03 — End: ?

## 2024-01-03 NOTE — Progress Notes (Unsigned)
 Virtual Visit via Video Note  I connected with [name] on 01/03/24 at  8:30 AM EST by a video enabled telemedicine application and verified that I am speaking with the correct person using two identifiers.  Location Provider Location : [facility] Patient Location : Car  Participants: Patient , Provider   I discussed the limitations of evaluation and management by telemedicine and the availability of in person appointments. The patient expressed understanding and agreed to proceed.   I discussed the assessment and treatment plan with the patient. The patient was provided an opportunity to ask questions and all were answered. The patient agreed with the plan and demonstrated an understanding of the instructions.   The patient was advised to call back or seek an in-person evaluation if the symptoms worsen or if the condition fails to improve as anticipated.  BH MD OP Progress Note  01/03/2024 9:11 AM [name]  MRN:  [id number]  Chief Complaint:  Chief Complaint  Patient presents with   Follow-up   Anxiety   Depression   Medication Refill   Discussed the use of AI scribe software for clinical note transcription with the patient, who gave verbal consent to proceed.  History of Present Illness [name] is a 48 year old Caucasian female, stay-at-home mom, divorced, lives in [place] has a history of MDD, panic disorder, PTSD, primary insomnia, history of seizure disorder, chronic pain, history of gastric bypass(2009), small bowel obstruction was evaluated by telemedicine today.  Managing ongoing stressors related to her parents' health, she reports adequately coping with anxiety symptoms. She describes staying busy and handling increased responsibilities, noting her mother recently had surgery for a broken ankle and her father continues to experience health issues.  She states her sleep remains pretty much okay and denies any thoughts of harming herself or  others.  Her current medication regimen includes Viibryd , Wellbutrin  150 mg, Ambien , hydroxyzine  50 mg twice daily as needed, and Buspar  (buspirone ) 5 to 10 mg as needed. She clarifies that she does not take Buspar  daily but uses it as needed, and notes difficulty keeping track of medications due to also managing her parents' medications.  She reports not yet establishing care with a therapist.  She recently underwent a cardiology evaluation, including invasive angiography, PET/CT for CAD evaluation, 2-week monitor for palpitation.  She has upcoming appointment scheduled with cardiology however reports as far as she knows her cardiology workup did not show anything concerning.    Visit Diagnosis:    ICD-10-CM   1. Recurrent major depressive disorder, in full remission  F33.42     2. Panic disorder  F41.0 busPIRone  (BUSPAR ) 10 MG tablet    3. PTSD (post-traumatic stress disorder)  F43.10 buPROPion  (WELLBUTRIN  XL) 150 MG 24 hr tablet    4. Primary insomnia  F51.01       Past Psychiatric History: I have reviewed past psychiatric history from progress note on 05/09/2017  Past Medical History:  Past Medical History:  Diagnosis Date   Abnormal ECG    Anemia    Anxiety    Arthritis    Asthma    B12 deficiency    Cardiomyopathy (HCC)    Chronic abdominal pain    Complication of anesthesia    difficulty to get sedated during endoscopy   COVID-19 01/18/2019   DDD (degenerative disc disease), lumbar    Depression    Diabetes mellitus without complication (HCC)    ONLY gestational diabetes, does not have chronic diabetes  Dysrhythmia    Family history of adverse reaction to anesthesia    brother woke during surgery   GERD (gastroesophageal reflux disease)    History of kidney stones    Iron  deficiency    Migraine headache    Neuropathy    Obesity    gastric bypass 2009   Ovarian cyst    Pelvic mass    Pneumonia    within past five years   PTSD (post-traumatic stress  disorder)    PTSD (post-traumatic stress disorder)    Rh negative status during pregnancy    Seizures (HCC)    post-gestational   Small bowel obstruction (HCC)    Vitamin D  deficiency     Past Surgical History:  Procedure Laterality Date   ABDOMINAL HYSTERECTOMY     BILATERAL SALPINGECTOMY Bilateral 09/29/2015   Procedure: BILATERAL SALPINGECTOMY;  Surgeon: Heather Penton, MD;  Location: [facility];  Service: Gynecology;  Laterality: Bilateral;   bowel obstruction  2011   CHOLECYSTECTOMY     COLONOSCOPY WITH PROPOFOL  N/A 01/24/2018   Procedure: COLONOSCOPY WITH PROPOFOL ;  Surgeon: Toledo, Ladell POUR, MD;  Location: [facility];  Service: Gastroenterology;  Laterality: N/A;   DILATION AND CURETTAGE OF UTERUS     ESOPHAGOGASTRODUODENOSCOPY     ESOPHAGOGASTRODUODENOSCOPY (EGD) WITH PROPOFOL  N/A 01/24/2018   Procedure: ESOPHAGOGASTRODUODENOSCOPY (EGD) WITH PROPOFOL ;  Surgeon: Toledo, Ladell POUR, MD;  Location: [facility];  Service: Gastroenterology;  Laterality: N/A;   GASTRIC BYPASS  2010   gi bleed  2009   Surgery-was in ICU for three weeks   HERNIA REPAIR     LEFT HEART CATH AND CORONARY ANGIOGRAPHY N/A 11/22/2023   Procedure: LEFT HEART CATH AND CORONARY ANGIOGRAPHY;  Surgeon: Jordan, Peter M, MD;  Location: [facility] INVASIVE CV LAB;  Service: Cardiovascular;  Laterality: N/A;   OVARIAN CYST REMOVAL Left 09/29/2015   Procedure: OVARIAN CYSTECTOMY;  Surgeon: Heather Penton, MD;  Location: [facility];  Service: Gynecology;  Laterality: Left;   ROBOTIC ASSISTED BILATERAL SALPINGO OOPHERECTOMY Bilateral 04/01/2023   Procedure: XI ROBOTIC ASSISTED BILATERAL SALPINGO OOPHORECTOMY,;  Surgeon: Penton Heather, MD;  Location: [facility];  Service: Gynecology;  Laterality: Bilateral;   ROBOTIC ASSISTED LAPAROSCOPIC LYSIS OF ADHESION N/A 04/01/2023   Procedure: XI ROBOTIC ASSISTED LAPAROSCOPIC LYSIS OF ADHESION;  Surgeon: Penton Heather, MD;  Location: [facility];  Service: Gynecology;  Laterality:  N/A;   ROUX-EN-Y PROCEDURE     VAGINAL HYSTERECTOMY N/A 09/29/2015   Procedure: HYSTERECTOMY VAGINAL;  Surgeon: Heather Penton, MD;  Location: [facility];  Service: Gynecology;  Laterality: N/A;   XI ROBOT ASSISTED DIAGNOSTIC LAPAROSCOPY N/A 04/01/2023   Procedure: XI ROBOT ASSISTED DIAGNOSTIC LAPAROSCOPY, POSSIBLE EXCISION OF ENDOMETRIOSIS, REMOVAL OF PELVIC MASS;  Surgeon: Penton Heather, MD;  Location: [facility];  Service: Gynecology;  Laterality: N/A;    Family Psychiatric History: I have reviewed family psychiatric history from progress note on 05/09/2017  Family History:  Family History  Problem Relation Age of Onset   Arthritis/Rheumatoid Mother    Heart block Mother    Clotting disorder Mother    Osteoporosis Mother    Heart attack Mother    Anxiety disorder Mother    Depression Mother    Colon polyps Mother    Stomach cancer Father 33   Heart disease Father    Heart attack Father    Depression Sister    Anxiety disorder Sister    Colon polyps Sister    Colon cancer Neg Hx    Esophageal cancer Neg Hx  Pancreatic cancer Neg Hx     Social History: Reviewed social history from progress note on 05/09/2017 Social History   Socioeconomic History   Marital status: Single    Spouse name: Not on file   Number of children: 2   Years of education: Not on file   Highest education level: Not on file  Occupational History   Occupation: disabled  Tobacco Use   Smoking status: Never   Smokeless tobacco: Never  Vaping Use   Vaping status: Never Used  Substance and Sexual Activity   Alcohol use: No    Alcohol/week: 0.0 standard drinks of alcohol   Drug use: No   Sexual activity: Not on file  Other Topics Concern   Not on file  Social History Narrative   Lives with mother, daughter, son, and fiance. Pets, dogs, in home.   Social Drivers of Corporate Investment Banker Strain: Not on file  Food Insecurity: Not on file  Transportation Needs: Not on file  Physical  Activity: Not on file  Stress: Not on file  Social Connections: Not on file    Allergies:  Allergies  Allergen Reactions   Amoxicillin Anaphylaxis, Hives, Shortness Of Breath, Swelling and Rash    Has patient had a PCN reaction causing immediate rash, facial/tongue/throat swelling, SOB or lightheadedness with hypotension: Yes Has patient had a PCN reaction causing severe rash involving mucus membranes or skin necrosis: Yes Has patient had a PCN reaction that required hospitalization No Has patient had a PCN reaction occurring within the last 10 years: No If all of the above answers are NO, then may proceed with Cephalosporin use.   Penicillins Anaphylaxis, Hives, Shortness Of Breath, Swelling and Rash    Has patient had a PCN reaction causing immediate rash, facial/tongue/throat swelling, SOB or lightheadedness with hypotension: Yes Has patient had a PCN reaction causing severe rash involving mucus membranes or skin necrosis: Yes Has patient had a PCN reaction that required hospitalization No Has patient had a PCN reaction occurring within the last 10 years: No If all of the above answers are NO, then may proceed with Cephalosporin use.   Morphine Other (See Comments)    Muscle spasms   Sulfa Antibiotics Nausea And Vomiting    Metabolic Disorder Labs: Lab Results  Component Value Date   HGBA1C 5.5 12/11/2019   MPG 111 12/11/2019   Lab Results  Component Value Date   PROLACTIN 12.1 12/11/2019   Lab Results  Component Value Date   CHOL 160 11/17/2023   TRIG 64 11/17/2023   HDL 98 11/17/2023   CHOLHDL 1.6 11/17/2023   VLDL 10 12/11/2019   LDLCALC 49 11/17/2023   LDLCALC 65 12/11/2019   Lab Results  Component Value Date   TSH 1.886 12/11/2019   TSH  05/13/2009    3.163 (NOTE)  Please note change in reference ranges for ages 27W to 32Y. Test methodology is 3rd generation TSH    Therapeutic Level Labs: No results found for: LITHIUM No results found for:  VALPROATE No results found for: CBMZ  Current Medications: Current Outpatient Medications  Medication Sig Dispense Refill   metoprolol succinate (TOPROL-XL) 25 MG 24 hr tablet Take 25 mg by mouth daily.     acetaminophen  (TYLENOL ) 650 MG CR tablet Take 1,300 mg by mouth every 8 (eight) hours as needed for pain.     ALPRAZolam  (XANAX ) 0.25 MG tablet TAKE 1 TABLET BY MOUTH EVERY DAY AS NEEDED FOR UP TO 3 DAYS FOR ANXIETY  aspirin EC 81 MG tablet Take 1 tablet (81 mg total) by mouth daily. Swallow whole. 90 tablet 0   buPROPion  (WELLBUTRIN  XL) 150 MG 24 hr tablet Take 1 tablet (150 mg total) by mouth daily. 90 tablet 1   busPIRone  (BUSPAR ) 10 MG tablet Take 0.5-1 tablets (5-10 mg total) by mouth daily as needed. 30 tablet 5   cyanocobalamin  (VITAMIN B12) 1000 MCG/ML injection Please administer 1000 mcg injection every 3 days 10 mL 4   estradiol (VIVELLE-DOT) 0.1 MG/24HR patch Place 1 patch onto the skin 2 (two) times a week.     famotidine  (PEPCID ) 20 MG tablet TAKE 1 TABLET BY MOUTH AT BEDTIME (Patient not taking: No sig reported) 30 tablet 0   HYDROcodone-acetaminophen  (NORCO) 7.5-325 MG tablet Take 1 tablet by mouth every 6 (six) hours as needed for moderate pain.     hydrOXYzine  (ATARAX ) 50 MG tablet TAKE 1 TABLET(50 MG) BY MOUTH TWICE DAILY AS NEEDED 60 tablet 5   Multiple Vitamins-Minerals (HM MULTIVITAMIN ADULT GUMMY) CHEW Chew 2 tablets by mouth daily. One a day     omeprazole (PRILOSEC) 20 MG capsule Take 20 mg by mouth daily.     pantoprazole  (PROTONIX ) 40 MG tablet Take 1 tablet (40 mg total) by mouth daily. 30 tablet 2   SUMAtriptan  (IMITREX ) 100 MG tablet Take 100 mg by mouth every 2 (two) hours as needed for migraine or headache.      topiramate  (TOPAMAX ) 50 MG tablet Take 100 mg by mouth 2 (two) times daily.     VENTOLIN HFA 108 (90 Base) MCG/ACT inhaler Inhale 2 puffs into the lungs every 4 (four) hours as needed.     Vilazodone  HCl (VIIBRYD ) 40 MG TABS TAKE 1 TABLET BY  MOUTH DAILY 90 tablet 3   Vitamin D , Ergocalciferol , (DRISDOL ) 1.25 MG (50000 UNIT) CAPS capsule TAKE 1 CAPSULE BY MOUTH ONCE DAILY 30 capsule 3   zolpidem  (AMBIEN  CR) 12.5 MG CR tablet TAKE 1 TABLET BY MOUTH AT BEDTIME 30 tablet 5   No current facility-administered medications for this visit.     Musculoskeletal: Strength & Muscle Tone: Seated Gait & Station: UTA Patient leans: N/A  Psychiatric Specialty Exam: Review of Systems  Psychiatric/Behavioral:  The patient is nervous/anxious.     There were no vitals taken for this visit.There is no height or weight on file to calculate BMI.  General Appearance: Fairly Groomed  Eye Contact:  Fair  Speech:  Clear and Coherent  Volume:  Normal  Mood:  Anxious  Affect:  Appropriate  Thought Process:  Goal Directed and Descriptions of Associations: Intact  Orientation:  Full (Time, Place, and Person)  Thought Content: Logical   Suicidal Thoughts:  No  Homicidal Thoughts:  No  Memory:  Immediate;   Fair Recent;   Fair Remote;   Fair  Judgement:  Fair  Insight:  Fair  Psychomotor Activity:  Normal  Concentration:  Concentration: Fair and Attention Span: Fair  Recall:  [employer] of Knowledge: Fair  Language: Fair  Akathisia:  No  Handed:  Right  AIMS (if indicated): not done  Assets:  Communication Skills Desire for Improvement Housing Social Support  ADL's:  Intact  Cognition: WNL  Sleep:  Fair   Screenings: AIMS    Flowsheet Row Video Visit from [facility]16/2021 in [facility]  AIMS Total Score 0   GAD-7    Flowsheet Row Office Visit from 03/24/2023 in [facility]  Visit from 07/05/2022 in [facility] Office Visit from 05/05/2022 in [facility] Office Visit from 03/19/2022 in [facility] Office Visit from 02/25/2022 in [facility]  Total GAD-7 Score 16 8 9 3 7    PHQ2-9    Flowsheet Row Office Visit from 03/24/2023 in [facility] Video Visit from 10/06/2022 in [facility] Office Visit from 07/05/2022 in [facility] Office Visit from 05/05/2022 in [facility] Office Visit from 03/19/2022 in [facility]  PHQ-2 Total Score 1 1 4 2 2   PHQ-9 Total Score -- -- 9 8 4    Flowsheet Row Video Visit from 01/03/2024 in [facility] Video Visit from 10/04/2023 in [facility] Video Visit from 07/05/2023 in [facility]  C-SSRS RISK CATEGORY No Risk No Risk No Risk     Assessment and Plan: [name] is a 48 year old Caucasian female who has a history of anxiety, insomnia was evaluated by telemedicine today.  Discussed assessment and plan as noted.  1. Recurrent major depressive disorder, in full remission Currently denies any significant depression symptoms Continue Viibryd  40 mg daily Continue Wellbutrin  XL 150 mg daily  2. Panic disorder-stable Reports overall improvement. Continue Hydroxyzine  50 mg twice a day as needed Continue BuSpar  5 to 10 mg daily as needed Noncompliant with psychotherapy referral.  3. PTSD (post-traumatic stress disorder)-improved Currently denies any significant concerns Continue BuSpar  and Hydroxyzine  as needed as prescribed Encouraged to establish care with therapist as needed patient has been noncompliant  4. Primary insomnia-improving Currently reports sleep is improving Continue sleep hygiene techniques Continue Zolpidem  extended release 12.5 mg daily at bedtime Reviewed [place] PMP AWARxE   Follow-up Follow-up in clinic in 3  months or sooner in person.  Collaboration of Care: Collaboration of Care: Other patient encouraged to keep appointment with cardiology.  I have reviewed notes per [facility], heart care most recent including 11/22/2023 - 01/01/2024 patient to sign an ROI to coordinate care  Patient/Guardian was advised Release of Information must be obtained prior to any record release in order to collaborate their care with an outside provider. Patient/Guardian was advised if they have not already done so to contact the registration department to sign all necessary forms in order for us  to release information regarding their care.   Consent: Patient/Guardian gives verbal consent for treatment and assignment of benefits for services provided during this visit. Patient/Guardian expressed understanding and agreed to proceed.   This note was generated in part or whole with voice recognition software. Voice recognition is usually quite accurate but there are transcription errors that can and very often do occur. I apologize for any typographical errors that were not detected and corrected.    Calani Gick, MD 01/04/2024, 6:47 AM

## 2024-01-09 ENCOUNTER — Ambulatory Visit: Admitting: Internal Medicine

## 2024-01-09 NOTE — Telephone Encounter (Signed)
 I called the patient and was able to give her the test results from her echo, heart cath and cholesterol panel. Per Dr. Kriste, Your echo was normal and did not show any concerning findings. Also, your heart catheterization and cholesterol panel were normal as well. Your stress test was falsely abnormal and your chest pain is not likely from your heart. Therefore, you can stop the aspirin  and atorvastatin . She demonstrated understanding and stated she will stop Asprin and Atorvastatin . I was able to take those medications off her medication list as well.

## 2024-02-10 ENCOUNTER — Other Ambulatory Visit: Payer: Self-pay | Admitting: Internal Medicine

## 2024-02-10 DIAGNOSIS — R002 Palpitations: Secondary | ICD-10-CM

## 2024-02-10 DIAGNOSIS — R9439 Abnormal result of other cardiovascular function study: Secondary | ICD-10-CM

## 2024-02-10 DIAGNOSIS — R079 Chest pain, unspecified: Secondary | ICD-10-CM

## 2024-02-21 ENCOUNTER — Encounter: Payer: Self-pay | Admitting: Oncology

## 2024-03-08 ENCOUNTER — Telehealth: Payer: Self-pay

## 2024-03-08 NOTE — Telephone Encounter (Signed)
 Please assist patient and complete PA

## 2024-03-08 NOTE — Telephone Encounter (Signed)
 Per Pharmacy patient will need prior Auth for future refills.

## 2024-03-15 ENCOUNTER — Encounter: Payer: Self-pay | Admitting: Oncology

## 2024-03-15 NOTE — Telephone Encounter (Signed)
 Pt notified and verbalized understanding no other concern at this time.

## 2024-03-20 ENCOUNTER — Observation Stay

## 2024-03-20 ENCOUNTER — Emergency Department

## 2024-03-20 ENCOUNTER — Encounter: Payer: Self-pay | Admitting: Oncology

## 2024-03-20 ENCOUNTER — Other Ambulatory Visit: Payer: Self-pay

## 2024-03-20 ENCOUNTER — Observation Stay: Admission: EM | Admit: 2024-03-20 | Source: Home / Self Care | Attending: Hospitalist | Admitting: Hospitalist

## 2024-03-20 DIAGNOSIS — R7989 Other specified abnormal findings of blood chemistry: Principal | ICD-10-CM

## 2024-03-20 DIAGNOSIS — K759 Inflammatory liver disease, unspecified: Secondary | ICD-10-CM | POA: Diagnosis not present

## 2024-03-20 DIAGNOSIS — M797 Fibromyalgia: Secondary | ICD-10-CM | POA: Diagnosis present

## 2024-03-20 DIAGNOSIS — Z9884 Bariatric surgery status: Secondary | ICD-10-CM

## 2024-03-20 DIAGNOSIS — F5101 Primary insomnia: Secondary | ICD-10-CM | POA: Diagnosis present

## 2024-03-20 DIAGNOSIS — F3341 Major depressive disorder, recurrent, in partial remission: Secondary | ICD-10-CM | POA: Diagnosis present

## 2024-03-20 DIAGNOSIS — G894 Chronic pain syndrome: Secondary | ICD-10-CM | POA: Diagnosis present

## 2024-03-20 DIAGNOSIS — K219 Gastro-esophageal reflux disease without esophagitis: Secondary | ICD-10-CM | POA: Diagnosis present

## 2024-03-20 LAB — CBC
HCT: 40.8 % (ref 36.0–46.0)
HCT: 38.1 % (ref 36.0–46.0)
Hemoglobin: 12.8 g/dL (ref 12.0–15.0)
Hemoglobin: 12.4 g/dL (ref 12.0–15.0)
MCH: 28.8 pg (ref 26.0–34.0)
MCH: 29.1 pg (ref 26.0–34.0)
MCHC: 31.4 g/dL (ref 30.0–36.0)
MCHC: 32.5 g/dL (ref 30.0–36.0)
MCV: 91.7 fL (ref 80.0–100.0)
MCV: 89.4 fL (ref 80.0–100.0)
Platelets: 286 10*3/uL (ref 150–400)
Platelets: 270 10*3/uL (ref 150–400)
RBC: 4.45 MIL/uL (ref 3.87–5.11)
RBC: 4.26 MIL/uL (ref 3.87–5.11)
RDW: 12.7 % (ref 11.5–15.5)
RDW: 12.9 % (ref 11.5–15.5)
WBC: 4.9 10*3/uL (ref 4.0–10.5)
WBC: 2.1 10*3/uL — ABNORMAL LOW (ref 4.0–10.5)
nRBC: 0 % (ref 0.0–0.2)
nRBC: 0 % (ref 0.0–0.2)

## 2024-03-20 LAB — CREATININE, SERUM
Creatinine, Ser: 0.64 mg/dL (ref 0.44–1.00)
GFR, Estimated: >60 mL/min

## 2024-03-20 LAB — D-DIMER, QUANTITATIVE: D-Dimer, Quant: 2.63 ug{FEU}/mL — ABNORMAL HIGH (ref 0.00–0.50)

## 2024-03-20 LAB — HEPATITIS PANEL, ACUTE
HCV Ab: NONREACTIVE
Hep A IgM: NONREACTIVE
Hep B C IgM: NONREACTIVE
Hepatitis B Surface Ag: NONREACTIVE

## 2024-03-20 LAB — URINALYSIS, ROUTINE W REFLEX MICROSCOPIC
Bilirubin Urine: NEGATIVE
Glucose, UA: NEGATIVE mg/dL
Hgb urine dipstick: NEGATIVE
Ketones, ur: NEGATIVE mg/dL
Nitrite: NEGATIVE
Protein, ur: 30 mg/dL — AB
Specific Gravity, Urine: 1.023 (ref 1.005–1.030)
pH: 5 (ref 5.0–8.0)

## 2024-03-20 LAB — HEPATIC FUNCTION PANEL
ALT: 622 U/L — ABNORMAL HIGH (ref 0–44)
AST: 1316 U/L — ABNORMAL HIGH (ref 15–41)
Albumin: 3.8 g/dL (ref 3.5–5.0)
Alkaline Phosphatase: 68 U/L (ref 38–126)
Bilirubin, Direct: 0.2 mg/dL (ref 0.0–0.2)
Indirect Bilirubin: 0.2 mg/dL — ABNORMAL LOW (ref 0.3–0.9)
Total Bilirubin: 0.3 mg/dL (ref 0.0–1.2)
Total Protein: 6.6 g/dL (ref 6.5–8.1)

## 2024-03-20 LAB — TROPONIN T, HIGH SENSITIVITY
Troponin T High Sensitivity: <6 ng/L (ref 0–19)
Troponin T High Sensitivity: <6 ng/L (ref 0–19)

## 2024-03-20 LAB — BASIC METABOLIC PANEL WITH GFR
Anion gap: 10 (ref 5–15)
BUN: 11 mg/dL (ref 6–20)
CO2: 24 mmol/L (ref 22–32)
Calcium: 8.5 mg/dL — ABNORMAL LOW (ref 8.9–10.3)
Chloride: 105 mmol/L (ref 98–111)
Creatinine, Ser: 0.66 mg/dL (ref 0.44–1.00)
GFR, Estimated: >60 mL/min
Glucose, Bld: 120 mg/dL — ABNORMAL HIGH (ref 70–99)
Potassium: 3.6 mmol/L (ref 3.5–5.1)
Sodium: 139 mmol/L (ref 135–145)

## 2024-03-20 LAB — HIV ANTIBODY (ROUTINE TESTING W REFLEX): HIV Screen 4th Generation wRfx: NONREACTIVE

## 2024-03-20 LAB — LIPASE, BLOOD: Lipase: 36 U/L (ref 11–51)

## 2024-03-20 LAB — ACETAMINOPHEN LEVEL: Acetaminophen (Tylenol), Serum: <10 ug/mL — ABNORMAL LOW (ref 10–30)

## 2024-03-20 MED ORDER — IBUPROFEN 400 MG PO TABS: 400.0000 mg | ORAL_TABLET | Freq: Four times a day (QID) | ORAL | Status: DC | PRN

## 2024-03-20 MED ORDER — TOPIRAMATE 25 MG PO TABS
100.0000 mg | ORAL_TABLET | Freq: Two times a day (BID) | ORAL | Status: AC
  Administered 2024-03-20 – 2024-03-23 (×7): 100 mg via ORAL
  Filled 2024-03-20 (×5): qty 1
  Filled 2024-03-20: qty 4
  Filled 2024-03-20: qty 1

## 2024-03-20 MED ORDER — POLYETHYLENE GLYCOL 3350 17 G PO PACK: 17.0000 g | PACK | Freq: Every day | ORAL | Status: AC | PRN

## 2024-03-20 MED ORDER — KETOROLAC TROMETHAMINE 15 MG/ML IJ SOLN
15.0000 mg | Freq: Three times a day (TID) | INTRAMUSCULAR | Status: AC
  Administered 2024-03-20 – 2024-03-23 (×11): 15 mg via INTRAVENOUS
  Filled 2024-03-20 (×11): qty 1

## 2024-03-20 MED ORDER — LIDOCAINE 5 % EX PTCH
1.0000 | MEDICATED_PATCH | Freq: Once | CUTANEOUS | Status: AC
  Administered 2024-03-20: 1 via TRANSDERMAL
  Filled 2024-03-20: qty 1

## 2024-03-20 MED ORDER — SUCRALFATE 1 GM/10ML PO SUSP
1.0000 g | Freq: Three times a day (TID) | ORAL | Status: AC
  Administered 2024-03-20 – 2024-03-23 (×13): 1 g via ORAL
  Filled 2024-03-20 (×15): qty 10

## 2024-03-20 MED ORDER — PANTOPRAZOLE SODIUM 40 MG PO TBEC: 40.0000 mg | DELAYED_RELEASE_TABLET | Freq: Two times a day (BID) | ORAL | Status: DC

## 2024-03-20 MED ORDER — GADOBUTROL 1 MMOL/ML IV SOLN
6.0000 mL | Freq: Once | INTRAVENOUS | Status: AC | PRN
  Administered 2024-03-20: 6 mL via INTRAVENOUS

## 2024-03-20 MED ORDER — LAMOTRIGINE 100 MG PO TABS
100.0000 mg | ORAL_TABLET | Freq: Two times a day (BID) | ORAL | Status: AC
  Administered 2024-03-21 – 2024-03-23 (×3): 100 mg via ORAL
  Filled 2024-03-20 (×5): qty 1

## 2024-03-20 MED ORDER — IOHEXOL 350 MG/ML SOLN
100.0000 mL | Freq: Once | INTRAVENOUS | Status: AC | PRN
  Administered 2024-03-20: 100 mL via INTRAVENOUS

## 2024-03-20 MED ORDER — BUPROPION HCL ER (XL) 150 MG PO TB24
150.0000 mg | ORAL_TABLET | Freq: Every day | ORAL | Status: AC
  Administered 2024-03-21 – 2024-03-23 (×3): 150 mg via ORAL
  Filled 2024-03-20 (×3): qty 1

## 2024-03-20 MED ORDER — ONDANSETRON HCL 4 MG/2ML IJ SOLN
4.0000 mg | Freq: Once | INTRAMUSCULAR | Status: AC
  Administered 2024-03-20: 4 mg via INTRAVENOUS
  Filled 2024-03-20: qty 2

## 2024-03-20 MED ORDER — VILAZODONE HCL 20 MG PO TABS
40.0000 mg | ORAL_TABLET | Freq: Every day | ORAL | Status: AC
  Administered 2024-03-21 – 2024-03-23 (×3): 40 mg via ORAL
  Filled 2024-03-20 (×3): qty 2

## 2024-03-20 MED ORDER — ZOLPIDEM TARTRATE 5 MG PO TABS
2.5000 mg | ORAL_TABLET | Freq: Once | ORAL | Status: AC
  Administered 2024-03-20: 2.5 mg via ORAL
  Filled 2024-03-20: qty 1

## 2024-03-20 MED ORDER — SUMATRIPTAN SUCCINATE 50 MG PO TABS
100.0000 mg | ORAL_TABLET | ORAL | Status: AC | PRN
  Administered 2024-03-21 (×3): 100 mg via ORAL
  Filled 2024-03-20 (×3): qty 2

## 2024-03-20 MED ORDER — KETOROLAC TROMETHAMINE 15 MG/ML IJ SOLN
15.0000 mg | Freq: Once | INTRAMUSCULAR | Status: AC
  Administered 2024-03-20: 15 mg via INTRAVENOUS
  Filled 2024-03-20: qty 1

## 2024-03-20 MED ORDER — HYDROMORPHONE HCL 1 MG/ML IJ SOLN
0.5000 mg | Freq: Once | INTRAMUSCULAR | Status: AC
  Administered 2024-03-20: 0.5 mg via INTRAVENOUS
  Filled 2024-03-20: qty 0.5

## 2024-03-20 MED ORDER — ONDANSETRON HCL 4 MG PO TABS
4.0000 mg | ORAL_TABLET | Freq: Four times a day (QID) | ORAL | Status: AC | PRN
  Administered 2024-03-23: 4 mg via ORAL
  Filled 2024-03-20: qty 1

## 2024-03-20 MED ORDER — PANTOPRAZOLE SODIUM 40 MG IV SOLR
40.0000 mg | Freq: Two times a day (BID) | INTRAVENOUS | Status: AC
  Administered 2024-03-20 – 2024-03-23 (×8): 40 mg via INTRAVENOUS
  Filled 2024-03-20 (×8): qty 10

## 2024-03-20 MED ORDER — ALBUTEROL SULFATE (2.5 MG/3ML) 0.083% IN NEBU: 2.5000 mg | INHALATION_SOLUTION | RESPIRATORY_TRACT | Status: AC | PRN

## 2024-03-20 MED ORDER — HEPARIN SODIUM (PORCINE) 5000 UNIT/ML IJ SOLN
5000.0000 [IU] | Freq: Three times a day (TID) | INTRAMUSCULAR | Status: AC
  Administered 2024-03-20 – 2024-03-23 (×11): 5000 [IU] via SUBCUTANEOUS
  Filled 2024-03-20 (×11): qty 1

## 2024-03-20 MED ORDER — ONDANSETRON HCL 4 MG/2ML IJ SOLN
4.0000 mg | Freq: Four times a day (QID) | INTRAMUSCULAR | Status: AC | PRN
  Administered 2024-03-20 – 2024-03-23 (×10): 4 mg via INTRAVENOUS
  Filled 2024-03-20 (×10): qty 2

## 2024-03-20 MED ORDER — SODIUM CHLORIDE 0.9 % IV BOLUS
1000.0000 mL | Freq: Once | INTRAVENOUS | Status: AC
  Administered 2024-03-20: 1000 mL via INTRAVENOUS

## 2024-03-20 MED ORDER — ZOLPIDEM TARTRATE 5 MG PO TABS
5.0000 mg | ORAL_TABLET | Freq: Every evening | ORAL | Status: AC | PRN
  Administered 2024-03-20 – 2024-03-23 (×4): 5 mg via ORAL
  Filled 2024-03-20 (×4): qty 1

## 2024-03-20 MED ORDER — METOPROLOL SUCCINATE ER 50 MG PO TB24: 25.0000 mg | ORAL_TABLET | Freq: Every day | ORAL | Status: DC

## 2024-03-20 MED ORDER — ALUM & MAG HYDROXIDE-SIMETH 200-200-20 MG/5ML PO SUSP: 30.0000 mL | Freq: Four times a day (QID) | ORAL | Status: AC | PRN

## 2024-03-20 MED ORDER — HYDROMORPHONE HCL 1 MG/ML IJ SOLN
0.5000 mg | INTRAMUSCULAR | Status: AC | PRN
  Administered 2024-03-20 – 2024-03-23 (×15): 0.5 mg via INTRAVENOUS
  Filled 2024-03-20 (×16): qty 0.5

## 2024-03-20 NOTE — ED Notes (Signed)
 Pt went to MRI.

## 2024-03-20 NOTE — ED Triage Notes (Signed)
 Pt come with right sided cp under breat. Pt states this started three days ago but worse today sho she came in. Pt stats some sob and nausea. Pt states pain does radiate to back.

## 2024-03-21 ENCOUNTER — Inpatient Hospital Stay

## 2024-03-21 DIAGNOSIS — R7401 Elevation of levels of liver transaminase levels: Secondary | ICD-10-CM | POA: Diagnosis not present

## 2024-03-21 LAB — COMPREHENSIVE METABOLIC PANEL WITH GFR
ALT: 1072 U/L — ABNORMAL HIGH (ref 0–44)
ALT: 1042 U/L — ABNORMAL HIGH (ref 0–44)
AST: 784 U/L — ABNORMAL HIGH (ref 15–41)
AST: 648 U/L — ABNORMAL HIGH (ref 15–41)
Albumin: 3.5 g/dL (ref 3.5–5.0)
Albumin: 3.8 g/dL (ref 3.5–5.0)
Alkaline Phosphatase: 80 U/L (ref 38–126)
Alkaline Phosphatase: 86 U/L (ref 38–126)
Anion gap: 10 (ref 5–15)
Anion gap: 9 (ref 5–15)
BUN: 12 mg/dL (ref 6–20)
BUN: 10 mg/dL (ref 6–20)
CO2: 24 mmol/L (ref 22–32)
CO2: 27 mmol/L (ref 22–32)
Calcium: 8.5 mg/dL — ABNORMAL LOW (ref 8.9–10.3)
Calcium: 9 mg/dL (ref 8.9–10.3)
Chloride: 104 mmol/L (ref 98–111)
Chloride: 105 mmol/L (ref 98–111)
Creatinine, Ser: 0.64 mg/dL (ref 0.44–1.00)
Creatinine, Ser: 0.69 mg/dL (ref 0.44–1.00)
GFR, Estimated: >60 mL/min
GFR, Estimated: >60 mL/min
Glucose, Bld: 101 mg/dL — ABNORMAL HIGH (ref 70–99)
Glucose, Bld: 96 mg/dL (ref 70–99)
Potassium: 4 mmol/L (ref 3.5–5.1)
Potassium: 4.4 mmol/L (ref 3.5–5.1)
Sodium: 138 mmol/L (ref 135–145)
Sodium: 141 mmol/L (ref 135–145)
Total Bilirubin: 0.4 mg/dL (ref 0.0–1.2)
Total Bilirubin: 0.4 mg/dL (ref 0.0–1.2)
Total Protein: 6.2 g/dL — ABNORMAL LOW (ref 6.5–8.1)
Total Protein: 6.7 g/dL (ref 6.5–8.1)

## 2024-03-21 LAB — CBC
HCT: 38.4 % (ref 36.0–46.0)
Hemoglobin: 11.9 g/dL — ABNORMAL LOW (ref 12.0–15.0)
MCH: 29 pg (ref 26.0–34.0)
MCHC: 31 g/dL (ref 30.0–36.0)
MCV: 93.4 fL (ref 80.0–100.0)
Platelets: 289 10*3/uL (ref 150–400)
RBC: 4.11 MIL/uL (ref 3.87–5.11)
RDW: 13 % (ref 11.5–15.5)
WBC: 3.3 10*3/uL — ABNORMAL LOW (ref 4.0–10.5)
nRBC: 0 % (ref 0.0–0.2)

## 2024-03-21 LAB — PROTIME-INR
INR: >10 (ref 0.8–1.2)
INR: 1 (ref 0.8–1.2)
Prothrombin Time: >90 s — ABNORMAL HIGH (ref 11.4–15.2)
Prothrombin Time: 13.5 s (ref 11.4–15.2)

## 2024-03-21 LAB — EPSTEIN-BARR VIRUS (EBV) ANTIBODY PROFILE
EBV NA IgG: 237 U/mL — ABNORMAL HIGH (ref 0.0–17.9)
EBV VCA IgG: 559 U/mL — ABNORMAL HIGH (ref 0.0–17.9)
EBV VCA IgM: <36 U/mL (ref 0.0–35.9)

## 2024-03-21 LAB — FIBRINOGEN: Fibrinogen: 457 mg/dL (ref 210–475)

## 2024-03-21 LAB — ALPHA-1-ANTITRYPSIN: A-1 Antitrypsin, Ser: 147 mg/dL (ref 101–187)

## 2024-03-21 LAB — MITOCHONDRIAL ANTIBODIES: Mitochondrial M2 Ab, IgG: <20 U (ref 0.0–20.0)

## 2024-03-21 LAB — CERULOPLASMIN: Ceruloplasmin: 27 mg/dL (ref 19.0–39.0)

## 2024-03-21 LAB — ANTI-SMOOTH MUSCLE ANTIBODY, IGG: F-Actin IgG: 70 U — ABNORMAL HIGH (ref 0–19)

## 2024-03-21 LAB — ANA W/REFLEX: Anti Nuclear Antibody (ANA): NEGATIVE

## 2024-03-21 MED ORDER — DEXTROSE 5 % IV SOLN: 6.2500 mg/kg/h | INTRAVENOUS | Status: DC

## 2024-03-21 MED ORDER — LIDOCAINE 5 % EX PTCH
1.0000 | MEDICATED_PATCH | CUTANEOUS | Status: AC
  Administered 2024-03-21 – 2024-03-23 (×3): 1 via TRANSDERMAL
  Filled 2024-03-21 (×3): qty 1

## 2024-03-21 MED ORDER — ACETYLCYSTEINE LOAD VIA INFUSION
150.0000 mg/kg | Freq: Once | INTRAVENOUS | Status: DC
  Filled 2024-03-21: qty 330

## 2024-03-21 MED ORDER — DEXTROSE 5 % IV SOLN
12.5000 mg/kg/h | INTRAVENOUS | Status: DC
  Filled 2024-03-21: qty 90

## 2024-03-21 MED ORDER — PHYTONADIONE 5 MG PO TABS
10.0000 mg | ORAL_TABLET | Freq: Once | ORAL | Status: DC
  Filled 2024-03-21: qty 2

## 2024-03-21 MED ORDER — ORAL CARE MOUTH RINSE: 15.0000 mL | OROMUCOSAL | Status: AC | PRN

## 2024-03-21 NOTE — Plan of Care (Signed)

## 2024-03-21 NOTE — Progress Notes (Signed)
 " PROGRESS NOTE    [name]  FMW:[id number] DOB: Oct 31, 1975 DOA: 03/20/2024 PCP: Loring Tanda Mae, MD   Assessment & Plan:   Principal Problem:   Hepatitis Active Problems:   History of gastric bypass   Fibromyalgia   Primary insomnia   MDD (major depressive disorder), recurrent, in partial remission   Chronic pain syndrome   GERD (gastroesophageal reflux disease)  Assessment and Plan: Right upper quadrant pain: etiology unclear. Does not take norco or tylenol  daily as per pt. MRCP showed mild dilatation of extraheptic bile duct which gradually tapers in the distal portion. No focal stricture, obstructing mass or choledocholithiasis seen. Marked hepatomegaly and no suspicious liver seen. US  liver ordered as per GI. GI following and recs apprec   Transaminitis: etiology unclear. Still elevated but trending down  Elevated INR: >10 this morning initially. Repeat INR 1.0. Etiology unclear, possibly lab error.    Chronic pain syndrome: was taking norco at home but not daily as per pt   GERD: continue on PPI, carafate    Depression; severity unknown. Continue on home dose of lamotrigine , bupropion , vilazodone    Insomnia: continue on home dose of ambien     Migraine: continue on home dose of sumatriptan , topiramate   HTN: continue on home dose of metoprolol   HLD: holding statin   Elevated d-dimer: CTA chest neg for PE. US  b/l LE neg for DVT   DVT prophylaxis: (SCDs Code Status: full  Family Communication: discussed pt's care w/ pt's fam at bedside and answered their questions  Disposition Plan: likely d/c home  Level of care: Med-Surg  Status is: Inpatient Remains inpatient appropriate because: severity of illness    Consultants:  GI  Procedures: (  Antimicrobials:   Subjective: Pt c/o fatigue   Objective: Vitals:   03/20/24 2216 03/21/24 0118 03/21/24 0441 03/21/24 0825  BP:  104/68 90/65 (!) 107/59  Pulse: 74 72 82 70  Resp:  16 16 16    Temp:  98.2 F (36.8 C) 98.4 F (36.9 C) 98.8 F (37.1 C)  TempSrc:   Oral Oral  SpO2: 97% 98% 97% 97%  Weight:      Height:        Intake/Output Summary (Last 24 hours) at 03/21/2024 1041 Last data filed at 03/21/2024 0151 Gross per 24 hour  Intake 120 ml  Output --  Net 120 ml   Filed Weights   03/20/24 0711  Weight: 67.1 kg    Examination:  General exam: Appears calm and comfortable  Respiratory system: Clear to auscultation. Respiratory effort normal. Cardiovascular system: S1 & S2+. No  rubs, gallops or clicks.  Gastrointestinal system: Abdomen is nondistended, soft and nontender. Hypoactive bowel sounds heard. Central nervous system: Alert and oriented. Moves all extremities  Psychiatry: Judgement and insight appears at baseline. Mood & affect appropriate.     Data Reviewed: I have personally reviewed following labs and imaging studies  CBC: Recent Labs  Lab 03/20/24 0712 03/20/24 1357 03/21/24 0541  WBC 4.9 2.1* 3.3*  HGB 12.8 12.4 11.9*  HCT 40.8 38.1 38.4  MCV 91.7 89.4 93.4  PLT 286 270 289   Basic Metabolic Panel: Recent Labs  Lab 03/20/24 0712 03/20/24 0922 03/21/24 0541  NA 139  --  138  K 3.6  --  4.0  CL 105  --  104  CO2 24  --  24  GLUCOSE 120*  --  101*  BUN 11  --  12  CREATININE 0.66 0.64 0.64  CALCIUM  8.5*  --  8.5*   GFR: Estimated Creatinine Clearance: 85.4 mL/min (by C-G formula based on SCr of 0.64 mg/dL). Liver Function Tests: Recent Labs  Lab 03/20/24 0712 03/21/24 0541  AST 1,316* 784*  ALT 622* 1,072*  ALKPHOS 68 80  BILITOT 0.3 0.4  PROT 6.6 6.2*  ALBUMIN  3.8 3.5   Recent Labs  Lab 03/20/24 0712  LIPASE 36   No results for input(s): AMMONIA in the last 168 hours. Coagulation Profile: Recent Labs  Lab 03/21/24 0541  INR >10.0*   Cardiac Enzymes: No results for input(s): CKTOTAL, CKMB, CKMBINDEX, TROPONINI in the last 168 hours. BNP (last 3 results) No results for input(s): PROBNP in the  last 8760 hours. HbA1C: No results for input(s): HGBA1C in the last 72 hours. CBG: No results for input(s): GLUCAP in the last 168 hours. Lipid Profile: No results for input(s): CHOL, HDL, LDLCALC, TRIG, CHOLHDL, LDLDIRECT in the last 72 hours. Thyroid  Function Tests: No results for input(s): TSH, T4TOTAL, FREET4, T3FREE, THYROIDAB in the last 72 hours. Anemia Panel: No results for input(s): VITAMINB12, FOLATE, FERRITIN, TIBC, IRON , RETICCTPCT in the last 72 hours. Sepsis Labs: No results for input(s): PROCALCITON, LATICACIDVEN in the last 168 hours.  No results found for this or any previous visit (from the past 240 hours).       Radiology Studies: MR ABDOMEN MRCP W WO CONTRAST Result Date: 03/20/2024 CLINICAL DATA:  RUQ abdominal pain, biliary disease suspected, no prior imaging RUQ pain, elevated LFTs, CBD dilation, no obvious stone one CT. EXAM: MRI ABDOMEN WITHOUT AND WITH CONTRAST (INCLUDING MRCP) TECHNIQUE: Multiplanar multisequence MR imaging of the abdomen was performed both before and after the administration of intravenous contrast. Heavily T2-weighted images of the biliary and pancreatic ducts were obtained, and three-dimensional MRCP images were rendered by post processing. CONTRAST:  6mL GADAVIST  GADOBUTROL  1 MMOL/ML IV SOLN COMPARISON:  CT scan abdomen and pelvis from 03/20/2024. FINDINGS: Lower chest: Unremarkable MR appearance to the lung bases. No pleural effusion. No pericardial effusion. Normal heart size. Hepatobiliary: The liver is markedly enlarged measuring up to 21.7 cm in length. Noncirrhotic configuration. No suspicious liver lesion. No intrahepatic bile duct dilation. The extrahepatic bile duct measures up to 7-7.5 mm in the proximal portion, 9.5-10 mm in the midportion and gradually tapers in the distal portion measuring up to 4 mm just before the ampulla of Vater. No focal stricture, obstructing mass or choledocholithiasis  seen. Gallbladder is surgically absent. Pancreas: No mass, inflammatory changes or other parenchymal abnormality identified. No main pancreatic duct dilation. Spleen:  Within normal limits in size and appearance. No focal mass. Adrenals/Urinary Tract: Unremarkable adrenal glands. No hydroureteronephrosis. There is a simple cortical cyst arising from the left kidney lower pole, measuring up to 9 x 11 mm. No suspicious renal mass. Stomach/Bowel: There is a small sliding hiatal hernia. Postsurgical changes of prior gastric bypass noted. Visualized portions within the abdomen are unremarkable. No disproportionate dilation of bowel loops. Vascular/Lymphatic: No pathologically enlarged lymph nodes identified. No abdominal aortic aneurysm demonstrated. No ascites. Other:  There is a tiny fat containing umbilical hernia. Musculoskeletal: No suspicious bone lesions identified. IMPRESSION: 1. There is mild dilation of the extrahepatic bile duct measuring up to 9.5-10 mm in the midportion, which gradually tapers in the distal portion. No focal stricture, obstructing mass or choledocholithiasis seen. Findings are nonspecific and may be related to post cholecystectomy status. 2. Marked hepatomegaly. No suspicious liver lesion. 3. Multiple other nonacute observations (such as marked hepatomegaly, surgically absent gallbladder, simple cyst  arising from the left kidney lower pole, small sliding hiatal hernia, prior gastric bypass, etc.), as described above. Electronically Signed   By: Ree Molt M.D.   On: 03/20/2024 12:42   MR 3D Recon At Scanner Result Date: 03/20/2024 CLINICAL DATA:  RUQ abdominal pain, biliary disease suspected, no prior imaging RUQ pain, elevated LFTs, CBD dilation, no obvious stone one CT. EXAM: MRI ABDOMEN WITHOUT AND WITH CONTRAST (INCLUDING MRCP) TECHNIQUE: Multiplanar multisequence MR imaging of the abdomen was performed both before and after the administration of intravenous contrast. Heavily  T2-weighted images of the biliary and pancreatic ducts were obtained, and three-dimensional MRCP images were rendered by post processing. CONTRAST:  6mL GADAVIST  GADOBUTROL  1 MMOL/ML IV SOLN COMPARISON:  CT scan abdomen and pelvis from 03/20/2024. FINDINGS: Lower chest: Unremarkable MR appearance to the lung bases. No pleural effusion. No pericardial effusion. Normal heart size. Hepatobiliary: The liver is markedly enlarged measuring up to 21.7 cm in length. Noncirrhotic configuration. No suspicious liver lesion. No intrahepatic bile duct dilation. The extrahepatic bile duct measures up to 7-7.5 mm in the proximal portion, 9.5-10 mm in the midportion and gradually tapers in the distal portion measuring up to 4 mm just before the ampulla of Vater. No focal stricture, obstructing mass or choledocholithiasis seen. Gallbladder is surgically absent. Pancreas: No mass, inflammatory changes or other parenchymal abnormality identified. No main pancreatic duct dilation. Spleen:  Within normal limits in size and appearance. No focal mass. Adrenals/Urinary Tract: Unremarkable adrenal glands. No hydroureteronephrosis. There is a simple cortical cyst arising from the left kidney lower pole, measuring up to 9 x 11 mm. No suspicious renal mass. Stomach/Bowel: There is a small sliding hiatal hernia. Postsurgical changes of prior gastric bypass noted. Visualized portions within the abdomen are unremarkable. No disproportionate dilation of bowel loops. Vascular/Lymphatic: No pathologically enlarged lymph nodes identified. No abdominal aortic aneurysm demonstrated. No ascites. Other:  There is a tiny fat containing umbilical hernia. Musculoskeletal: No suspicious bone lesions identified. IMPRESSION: 1. There is mild dilation of the extrahepatic bile duct measuring up to 9.5-10 mm in the midportion, which gradually tapers in the distal portion. No focal stricture, obstructing mass or choledocholithiasis seen. Findings are nonspecific  and may be related to post cholecystectomy status. 2. Marked hepatomegaly. No suspicious liver lesion. 3. Multiple other nonacute observations (such as marked hepatomegaly, surgically absent gallbladder, simple cyst arising from the left kidney lower pole, small sliding hiatal hernia, prior gastric bypass, etc.), as described above. Electronically Signed   By: Ree Molt M.D.   On: 03/20/2024 12:42   CT Angio Chest Pulmonary Embolism (PE) W or WO Contrast Result Date: 03/20/2024 CLINICAL DATA:  Right-sided chest pain, positive D-dimer EXAM: CT ANGIOGRAPHY CHEST WITH CONTRAST TECHNIQUE: Multidetector CT imaging of the chest was performed using the standard protocol during bolus administration of intravenous contrast. Multiplanar CT image reconstructions and MIPs were obtained to evaluate the vascular anatomy. RADIATION DOSE REDUCTION: This exam was performed according to the departmental dose-optimization program which includes automated exposure control, adjustment of the mA and/or kV according to patient size and/or use of iterative reconstruction technique. CONTRAST:  OMNIPAQUE  IOHEXOL  350 MG/ML SOLN COMPARISON:  April 05, 2023 FINDINGS: Cardiovascular: Satisfactory opacification of the pulmonary arteries to the segmental level. No evidence of pulmonary embolism. Normal heart size. No pericardial effusion. Mediastinum/Nodes: No enlarged mediastinal, hilar, or axillary lymph nodes. Thyroid  gland, trachea, and esophagus demonstrate no significant findings. Lungs/Pleura: Lungs are clear. No pleural effusion or pneumothorax. Upper Abdomen: No acute  abnormality. Musculoskeletal: No chest wall abnormality. No acute or significant osseous findings. Review of the MIP images confirms the above findings. IMPRESSION: No definite evidence of pulmonary embolus. No acute abnormality seen in the chest. Electronically Signed   By: Lynwood Landy Raddle M.D.   On: 03/20/2024 10:22   CT ABDOMEN PELVIS W CONTRAST Result  Date: 03/20/2024 EXAM: CT ABDOMEN AND PELVIS WITH CONTRAST 03/20/2024 10:04:33 AM TECHNIQUE: CT of the abdomen and pelvis was performed with the administration of 100 mL of iohexol  (OMNIPAQUE ) 350 MG/ML injection. Multiplanar reformatted images are provided for review. Automated exposure control, iterative reconstruction, and/or weight-based adjustment of the mA/kV was utilized to reduce the radiation dose to as low as reasonably achievable. COMPARISON: CT of the abdomen and pelvis dated 05/12/2023. CLINICAL HISTORY: Abdominal pain, acute, nonlocalized; right sided abd pain, h/o chole approx 15 yeas ago, elevated LFTs. Acute, nonlocalized abdominal pain; right-sided abdominal pain; history of cholecystectomy approximately 15 years ago; elevated liver function tests. FINDINGS: LOWER CHEST: Mild atelectasis present dependently within the lower lobes bilaterally. LIVER: Mild intrahepatic and extrahepatic biliary ductal dilatation. The common bile duct measures approximately 11 mm. There is no obstructing lesion evident. Differential diagnoses for biliary ductal dilatation include post-cholecystectomy changes, chronic pancreatitis, or a non-obstructing stone. Follow-up imaging with magnetic resonance cholangiopancreatography (MRCP) may be considered for further characterization, especially given the history of elevated LFTs. GALLBLADDER AND BILE DUCTS: Status post cholecystectomy. Mild intrahepatic and extrahepatic biliary ductal dilatation. The common bile duct measures approximately 11 mm. There is no obstructing lesion evident. Differential diagnoses for biliary ductal dilatation include post-cholecystectomy changes, chronic pancreatitis, or a non-obstructing stone. Follow-up imaging with magnetic resonance cholangiopancreatography (MRCP) may be considered for further characterization, especially given the history of elevated LFTs. SPLEEN: No acute abnormality. PANCREAS: The pancreatic duct is normal in caliber.  ADRENAL GLANDS: No acute abnormality. KIDNEYS, URETERS AND BLADDER: A small cyst is again demonstrated within the lower pole of the left kidney and appears unchanged. No follow up is necessary. No stones in the kidneys or ureters. No hydronephrosis. No perinephric or periureteral stranding. Urinary bladder is unremarkable. GI AND BOWEL: Status post gastric bypass surgery. Stomach demonstrates no acute abnormality. There is no bowel obstruction. PERITONEUM AND RETROPERITONEUM: No ascites. No free air. VASCULATURE: Aorta is normal in caliber. LYMPH NODES: No lymphadenopathy. REPRODUCTIVE ORGANS: Status post hysterectomy and bilateral salpingo-oophorectomy. BONES AND SOFT TISSUES: No acute osseous abnormality. No focal soft tissue abnormality. IMPRESSION: 1. Mild intrahepatic and extrahepatic biliary ductal dilatation with a common bile duct measuring approximately 11 mm. No obstructing lesion evident. Differential considerations include post-cholecystectomy biliary ectasia, occult choledocholithiasis/passed stone, distal biliary stricture, or sphincter of Oddi dysfunction; recommend correlation with LFTs and, if clinically indicated, further evaluation with MRCP (or ERCP/EUS as appropriate). 2. Status post gastric bypass surgery, cholecystectomy, hysterectomy, and bilateral salpingo-oophorectomy. 3. Small left renal cyst, unchanged, consistent with a benign simple cyst (Bosniak I/II). No follow-up imaging is recommended. Electronically signed by: Evalene Coho MD 03/20/2024 10:20 AM EST RP Workstation: [id number]   DG Chest 2 View Result Date: 03/20/2024 EXAM: PA AND LATERAL (2) VIEW(S) XRAY OF THE CHEST 03/20/2024 07:34:00 AM COMPARISON: PA and lateral 02/10/2022. CLINICAL HISTORY: Chest pain. FINDINGS: LUNGS AND PLEURA: The lungs are clear. No focal pulmonary opacity. No pleural effusion. No pneumothorax. HEART AND MEDIASTINUM: The heart has slightly enlarged since the prior study. No vascular congestion is  seen. Mediastinal configuration appears normal. BONES AND SOFT TISSUES: No acute osseous abnormality. IMPRESSION: 1. No acute cardiopulmonary  findings. 2. Mild cardiomegaly, new compared to prior, without vascular congestion. Electronically signed by: Francis Quam MD 03/20/2024 07:41 AM EST RP Workstation: [id number]        Scheduled Meds:  buPROPion   150 mg Oral Daily   heparin   5,000 Units Subcutaneous Q8H   ketorolac   15 mg Intravenous Q8H   lamoTRIgine   100 mg Oral BID   pantoprazole  (PROTONIX ) IV  40 mg Intravenous Q12H   phytonadione   10 mg Oral Once   sucralfate   1 g Oral TID WC & HS   topiramate   100 mg Oral BID   Vilazodone  HCl  40 mg Oral Daily   Continuous Infusions:   LOS: 0 days       Anthony CHRISTELLA Pouch, MD [facility] Pager [phone]  If 7PM-7AM, please contact night-coverage [email or web address] 03/21/2024, 10:41 AM   "

## 2024-03-22 ENCOUNTER — Ambulatory Visit: Admitting: Psychiatry

## 2024-03-22 DIAGNOSIS — K759 Inflammatory liver disease, unspecified: Secondary | ICD-10-CM | POA: Diagnosis not present

## 2024-03-22 DIAGNOSIS — R7401 Elevation of levels of liver transaminase levels: Secondary | ICD-10-CM | POA: Diagnosis not present

## 2024-03-22 LAB — CBC
HCT: 37.7 % (ref 36.0–46.0)
Hemoglobin: 11.8 g/dL — ABNORMAL LOW (ref 12.0–15.0)
MCH: 28.9 pg (ref 26.0–34.0)
MCHC: 31.3 g/dL (ref 30.0–36.0)
MCV: 92.2 fL (ref 80.0–100.0)
Platelets: 285 10*3/uL (ref 150–400)
RBC: 4.09 MIL/uL (ref 3.87–5.11)
RDW: 12.9 % (ref 11.5–15.5)
WBC: 3.7 10*3/uL — ABNORMAL LOW (ref 4.0–10.5)
nRBC: 0 % (ref 0.0–0.2)

## 2024-03-22 LAB — AMMONIA: Ammonia: 22 umol/L (ref 9–35)

## 2024-03-22 LAB — COMPREHENSIVE METABOLIC PANEL WITH GFR
ALT: 619 U/L — ABNORMAL HIGH (ref 0–44)
AST: 219 U/L — ABNORMAL HIGH (ref 15–41)
Albumin: 3.4 g/dL — ABNORMAL LOW (ref 3.5–5.0)
Alkaline Phosphatase: 70 U/L (ref 38–126)
Anion gap: 8 (ref 5–15)
BUN: 12 mg/dL (ref 6–20)
CO2: 26 mmol/L (ref 22–32)
Calcium: 8.7 mg/dL — ABNORMAL LOW (ref 8.9–10.3)
Chloride: 108 mmol/L (ref 98–111)
Creatinine, Ser: 0.6 mg/dL (ref 0.44–1.00)
GFR, Estimated: >60 mL/min
Glucose, Bld: 85 mg/dL (ref 70–99)
Potassium: 3.9 mmol/L (ref 3.5–5.1)
Sodium: 142 mmol/L (ref 135–145)
Total Bilirubin: 0.3 mg/dL (ref 0.0–1.2)
Total Protein: 6 g/dL — ABNORMAL LOW (ref 6.5–8.1)

## 2024-03-22 LAB — IGG, IGA, IGM
IgA: 174 mg/dL (ref 87–352)
IgG (Immunoglobin G), Serum: 1041 mg/dL (ref 586–1602)
IgM (Immunoglobulin M), Srm: 29 mg/dL (ref 26–217)

## 2024-03-22 LAB — URINE CULTURE: Culture: NO GROWTH

## 2024-03-22 LAB — PROTIME-INR
INR: 0.9 (ref 0.8–1.2)
Prothrombin Time: 13 s (ref 11.4–15.2)

## 2024-03-22 MED ORDER — PROCHLORPERAZINE MALEATE 5 MG PO TABS: 5.0000 mg | ORAL_TABLET | Freq: Four times a day (QID) | ORAL | Status: AC | PRN

## 2024-03-22 MED ORDER — ALPRAZOLAM 0.25 MG PO TABS
0.2500 mg | ORAL_TABLET | Freq: Two times a day (BID) | ORAL | Status: AC | PRN
  Administered 2024-03-22 – 2024-03-23 (×3): 0.25 mg via ORAL
  Filled 2024-03-22 (×3): qty 1

## 2024-03-22 NOTE — Progress Notes (Signed)
 "    Elizabeth Copping, MD [facility]   [address] [place], [place] [address] Phone: [phone] Fax : [phone]   Subjective: The patient was seen today with multiple questions about her lab results.  The patient's labs have all been reviewed and the liver enzymes have come down significantly.  The patient continues to have right upper quadrant pain which is likely from the liver swelling.  The patient has been told that her SMA is positive and this is commonly seen as a false positive with liver inflammation.  The patient is accompanied by her husband and her mother and her daughter in the room today.   Objective: Vital signs in last 24 hours: Vitals:   03/22/24 0421 03/22/24 0601 03/22/24 0853 03/22/24 1113  BP: (!) 86/59 102/67 108/69 96/68  Pulse: 87 64 76 89  Resp: 16  16   Temp: 98.6 F (37 C)  98.4 F (36.9 C)   TempSrc: Oral     SpO2: 100% 97% 95% 97%  Weight:      Height:       Weight change:   Intake/Output Summary (Last 24 hours) at 03/22/2024 1418 Last data filed at 03/22/2024 0600 Gross per 24 hour  Intake 480 ml  Output --  Net 480 ml     Exam: General: The patient is sitting up in bed in no apparent distress alert and oriented x 3  Lab Results: @LABTEST2 @ Micro Results: No results found for this or any previous visit (from the past 240 hours). Studies/Results: US  LIVER DOPPLER Result Date: 03/21/2024 CLINICAL DATA:  [id number] Liver function abnormality [id number] EXAM: DUPLEX ULTRASOUND OF LIVER TECHNIQUE: Color and duplex Doppler ultrasound was performed to evaluate the hepatic in-flow and out-flow vessels. COMPARISON:  03/20/2024 CT abdomen pelvis with contrast FINDINGS: Liver: Normal parenchymal echogenicity. Normal hepatic contour without nodularity. No focal lesion, mass or intrahepatic biliary ductal dilatation. Main Portal Vein size: 1.16 cm cm Portal Vein Velocities Main Prox:  39 cm/sec Main Mid: 34 cm/sec Main Dist:  31 cm/sec Right: 20 cm/sec Left: 25  cm/sec Hepatic Vein Velocities Right:  21 cm/sec Middle:  34 cm/sec Left:  18 cm/sec IVC: Present and patent with normal respiratory phasicity. Hepatic Artery Velocity:  82 cm/sec Splenic Vein Velocity:  18 cm/sec Spleen: 8.4 cm x 3.8 cm x 8.4 cm with a total volume of 140 cm^3 (411 cm^3 is upper limit normal) Portal Vein Occlusion/Thrombus: No Splenic Vein Occlusion/Thrombus: No Ascites: None Varices: None Patent portal, hepatic and splenic veins. Normal directional flow. No portal vein occlusion or thrombus. IMPRESSION: Normal hepatic venous Doppler. Electronically Signed   By: CHRISTELLA.  Shick M.D.   On: 03/21/2024 16:23   US  Venous Img Lower Bilateral (DVT) Result Date: 03/21/2024 CLINICAL DATA:  49 year old female with bilateral lower extremity swelling. EXAM: BILATERAL LOWER EXTREMITY VENOUS DOPPLER ULTRASOUND TECHNIQUE: Gray-scale sonography with graded compression, as well as color Doppler and duplex ultrasound were performed to evaluate the lower extremity deep venous systems from the level of the common femoral vein and including the common femoral, femoral, profunda femoral, popliteal and calf veins including the posterior tibial, peroneal and gastrocnemius veins when visible. The superficial great saphenous vein was also interrogated. Spectral Doppler was utilized to evaluate flow at rest and with distal augmentation maneuvers in the common femoral, femoral and popliteal veins. COMPARISON:  None Available. FINDINGS: RIGHT LOWER EXTREMITY Common Femoral Vein: No evidence of thrombus. Normal compressibility, respiratory phasicity and response to augmentation. Saphenofemoral Junction: No evidence of  thrombus. Normal compressibility and flow on color Doppler imaging. Profunda Femoral Vein: No evidence of thrombus. Normal compressibility and flow on color Doppler imaging. Femoral Vein: No evidence of thrombus. Normal compressibility, respiratory phasicity and response to augmentation. Popliteal Vein: No evidence  of thrombus. Normal compressibility, respiratory phasicity and response to augmentation. Calf Veins: No evidence of thrombus. Normal compressibility and flow on color Doppler imaging. Other Findings:  None. LEFT LOWER EXTREMITY Common Femoral Vein: No evidence of thrombus. Normal compressibility, respiratory phasicity and response to augmentation. Saphenofemoral Junction: No evidence of thrombus. Normal compressibility and flow on color Doppler imaging. Profunda Femoral Vein: No evidence of thrombus. Normal compressibility and flow on color Doppler imaging. Femoral Vein: No evidence of thrombus. Normal compressibility, respiratory phasicity and response to augmentation. Popliteal Vein: No evidence of thrombus. Normal compressibility, respiratory phasicity and response to augmentation. Calf Veins: No evidence of thrombus. Normal compressibility and flow on color Doppler imaging. . Other Findings:  None. IMPRESSION: No evidence of bilateral lower extremity deep venous thrombosis. Ester Sides, MD [facility] Electronically Signed   By: Ester Sides M.D.   On: 03/21/2024 16:07   Medications: I have reviewed the patient's current medications. Scheduled Meds:  buPROPion   150 mg Oral Daily   heparin   5,000 Units Subcutaneous Q8H   ketorolac   15 mg Intravenous Q8H   lamoTRIgine   100 mg Oral BID   lidocaine   1 patch Transdermal Q24H   pantoprazole  (PROTONIX ) IV  40 mg Intravenous Q12H   sucralfate   1 g Oral TID WC & HS   topiramate   100 mg Oral BID   Vilazodone  HCl  40 mg Oral Daily   Continuous Infusions: PRN Meds:.albuterol , ALPRAZolam , alum & mag hydroxide-simeth, HYDROmorphone  (DILAUDID ) injection, ondansetron  **OR** ondansetron  (ZOFRAN ) IV, mouth rinse, polyethylene glycol, prochlorperazine , SUMAtriptan , zolpidem    Assessment: Principal Problem:   Hepatitis Active Problems:   Fibromyalgia   Primary insomnia   MDD (major depressive  disorder), recurrent, in partial remission   Chronic pain syndrome   History of gastric bypass   GERD (gastroesophageal reflux disease)    Plan: This patient came in with acute hepatitis which was accompanied by leukopenia and may indicate a viral infection.  The patient's blood work showed her to have a normal IgG and normal ANA.  The patient's SMA was elevated which can be seen with liver inflammation is likely a false positive.  The patient's EBV serology shows a previous infection but no acute infection.  The patient's INR and bilirubin continue to remain normal. The patient has been given ample time to ask questions and those questions were answered.  The patient has been told that I expect the liver enzymes to continue to decrease and that she should have her SMA checked again in 1 to 2 months.  I have also told the patient that autoimmune hepatitis is unlikely the cause of her symptoms at this time.   LOS: 1 day   Elizabeth Copping, MD.FACG 03/22/2024, 2:18 PM Pager [phone] 7am-5pm  Check AMION for 5pm -7am coverage and on weekends  "

## 2024-03-22 NOTE — Progress Notes (Signed)
 " PROGRESS NOTE    [name]  FMW:[id number] DOB: 1975-03-09 DOA: 03/20/2024 PCP: Loring Tanda Mae, MD   Assessment & Plan:   Principal Problem:   Hepatitis Active Problems:   History of gastric bypass   Fibromyalgia   Primary insomnia   MDD (major depressive disorder), recurrent, in partial remission   Chronic pain syndrome   GERD (gastroesophageal reflux disease)  Assessment and Plan: Right upper quadrant pain: etiology unclear. Does not take norco or tylenol  daily as per pt. MRCP showed mild dilatation of extraheptic bile duct which gradually tapers in the distal portion. No focal stricture, obstructing mass or choledocholithiasis seen. Marked hepatomegaly and no suspicious liver seen. US  liver doppler was normal. GI following and recs apprec   Transaminitis: etiology unclear, possible viral infection. Still elevated but trending down. Ammonia is WNL and was requested by pt. 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 Should have repeat anti-smooth muscle antibody checked as an outpatient in 1-2 months as per GI.   Elevated INR: >10 this morning initially. Repeat INR 1.0. Etiology unclear, possibly lab error. Resolved    Chronic pain syndrome: was taking norco at home but not daily as per pt   GERD: continue on PPI, carafate     Depression; severity unknown. Continue on home dose of lamotrigine , bupropion , vilazodone     Insomnia: continue on home dose of ambien     Migraines: continue on home dose of topiramate  & sumatriptan    HTN: continue on home dose of metoprolol    HLD: holding statin   Elevated d-dimer: CTA chest neg for PE. US  b/l LE neg for DVT   DVT prophylaxis: (SCDs Code Status: full  Family Communication: discussed pt's care w/ pt's fam at bedside and answered their questions  Disposition Plan: likely d/c home  Level of care: Med-Surg  Status is: Inpatient Remains inpatient appropriate because: severity of  illness    Consultants:  GI  Procedures:   Antimicrobials:   Subjective: Pt c/o intermittent nausea  Objective: Vitals:   03/21/24 2116 03/22/24 0421 03/22/24 0601 03/22/24 0853  BP: (!) 96/59 (!) 86/59 102/67 108/69  Pulse: 76 87 64 76  Resp: 16 16  16   Temp: 98 F (36.7 C) 98.6 F (37 C)  98.4 F (36.9 C)  TempSrc: Oral Oral    SpO2: 100% 100% 97% 95%  Weight:      Height:        Intake/Output Summary (Last 24 hours) at 03/22/2024 1031 Last data filed at 03/22/2024 0600 Gross per 24 hour  Intake 480 ml  Output --  Net 480 ml   Filed Weights   03/20/24 0711  Weight: 67.1 kg    Examination:  General exam: Appears comfortable  Respiratory system: clear breath sounds b/l  Cardiovascular system: S1/S2+. No rubs or clicks  Gastrointestinal system: Abd is soft, NT, ND & hypoactive bowel sounds  Central nervous system: alert & oriented. Moves all extremities  Psychiatry: Judgement and insight appears at baseline     Data Reviewed: I have personally reviewed following labs and imaging studies  CBC: Recent Labs  Lab 03/20/24 0712 03/20/24 1357 03/21/24 0541 03/22/24 0614  WBC 4.9 2.1* 3.3* 3.7*  HGB 12.8 12.4 11.9* 11.8*  HCT 40.8 38.1 38.4 37.7  MCV 91.7 89.4 93.4 92.2  PLT 286 270 289 285   Basic Metabolic Panel: Recent Labs  Lab 03/20/24 0712 03/20/24 0922 03/21/24 0541 03/21/24 1027 03/22/24 0614  NA 139  --  138 141 142  K 3.6  --  4.0 4.4 3.9  CL 105  --  104 105 108  CO2 24  --  24 27 26   GLUCOSE 120*  --  101* 96 85  BUN 11  --  12 10 12   CREATININE 0.66 0.64 0.64 0.69 0.60  CALCIUM  8.5*  --  8.5* 9.0 8.7*   GFR: Estimated Creatinine Clearance: 85.4 mL/min (by C-G formula based on SCr of 0.6 mg/dL). Liver Function Tests: Recent Labs  Lab 03/20/24 0712 03/21/24 0541 03/21/24 1027 03/22/24 0614  AST 1,316* 784* 648* 219*  ALT 622* 1,072* 1,042* 619*  ALKPHOS 68 80 86 70  BILITOT 0.3 0.4 0.4 0.3  PROT 6.6 6.2* 6.7 6.0*   ALBUMIN  3.8 3.5 3.8 3.4*   Recent Labs  Lab 03/20/24 0712  LIPASE 36   No results for input(s): AMMONIA in the last 168 hours. Coagulation Profile: Recent Labs  Lab 03/21/24 0541 03/21/24 1027 03/22/24 0614  INR >10.0* 1.0 0.9   Cardiac Enzymes: No results for input(s): CKTOTAL, CKMB, CKMBINDEX, TROPONINI in the last 168 hours. BNP (last 3 results) No results for input(s): PROBNP in the last 8760 hours. HbA1C: No results for input(s): HGBA1C in the last 72 hours. CBG: No results for input(s): GLUCAP in the last 168 hours. Lipid Profile: No results for input(s): CHOL, HDL, LDLCALC, TRIG, CHOLHDL, LDLDIRECT in the last 72 hours. Thyroid  Function Tests: No results for input(s): TSH, T4TOTAL, FREET4, T3FREE, THYROIDAB in the last 72 hours. Anemia Panel: No results for input(s): VITAMINB12, FOLATE, FERRITIN, TIBC, IRON , RETICCTPCT in the last 72 hours. Sepsis Labs: No results for input(s): PROCALCITON, LATICACIDVEN in the last 168 hours.  No results found for this or any previous visit (from the past 240 hours).       Radiology Studies: US  LIVER DOPPLER Result Date: 03/21/2024 CLINICAL DATA:  [id number] Liver function abnormality [id number] EXAM: DUPLEX ULTRASOUND OF LIVER TECHNIQUE: Color and duplex Doppler ultrasound was performed to evaluate the hepatic in-flow and out-flow vessels. COMPARISON:  03/20/2024 CT abdomen pelvis with contrast FINDINGS: Liver: Normal parenchymal echogenicity. Normal hepatic contour without nodularity. No focal lesion, mass or intrahepatic biliary ductal dilatation. Main Portal Vein size: 1.16 cm cm Portal Vein Velocities Main Prox:  39 cm/sec Main Mid: 34 cm/sec Main Dist:  31 cm/sec Right: 20 cm/sec Left: 25 cm/sec Hepatic Vein Velocities Right:  21 cm/sec Middle:  34 cm/sec Left:  18 cm/sec IVC: Present and patent with normal respiratory phasicity. Hepatic Artery Velocity:  82 cm/sec Splenic Vein  Velocity:  18 cm/sec Spleen: 8.4 cm x 3.8 cm x 8.4 cm with a total volume of 140 cm^3 (411 cm^3 is upper limit normal) Portal Vein Occlusion/Thrombus: No Splenic Vein Occlusion/Thrombus: No Ascites: None Varices: None Patent portal, hepatic and splenic veins. Normal directional flow. No portal vein occlusion or thrombus. IMPRESSION: Normal hepatic venous Doppler. Electronically Signed   By: CHRISTELLA.  Shick M.D.   On: 03/21/2024 16:23   US  Venous Img Lower Bilateral (DVT) Result Date: 03/21/2024 CLINICAL DATA:  49 year old female with bilateral lower extremity swelling. EXAM: BILATERAL LOWER EXTREMITY VENOUS DOPPLER ULTRASOUND TECHNIQUE: Gray-scale sonography with graded compression, as well as color Doppler and duplex ultrasound were performed to evaluate the lower extremity deep venous systems from the level of the common femoral vein and including the common femoral, femoral, profunda femoral, popliteal and calf veins including the posterior tibial, peroneal and gastrocnemius veins when visible. The superficial great saphenous vein was also interrogated. Spectral  Doppler was utilized to evaluate flow at rest and with distal augmentation maneuvers in the common femoral, femoral and popliteal veins. COMPARISON:  None Available. FINDINGS: RIGHT LOWER EXTREMITY Common Femoral Vein: No evidence of thrombus. Normal compressibility, respiratory phasicity and response to augmentation. Saphenofemoral Junction: No evidence of thrombus. Normal compressibility and flow on color Doppler imaging. Profunda Femoral Vein: No evidence of thrombus. Normal compressibility and flow on color Doppler imaging. Femoral Vein: No evidence of thrombus. Normal compressibility, respiratory phasicity and response to augmentation. Popliteal Vein: No evidence of thrombus. Normal compressibility, respiratory phasicity and response to augmentation. Calf Veins: No evidence of thrombus. Normal compressibility and flow on color Doppler imaging. Other  Findings:  None. LEFT LOWER EXTREMITY Common Femoral Vein: No evidence of thrombus. Normal compressibility, respiratory phasicity and response to augmentation. Saphenofemoral Junction: No evidence of thrombus. Normal compressibility and flow on color Doppler imaging. Profunda Femoral Vein: No evidence of thrombus. Normal compressibility and flow on color Doppler imaging. Femoral Vein: No evidence of thrombus. Normal compressibility, respiratory phasicity and response to augmentation. Popliteal Vein: No evidence of thrombus. Normal compressibility, respiratory phasicity and response to augmentation. Calf Veins: No evidence of thrombus. Normal compressibility and flow on color Doppler imaging. . Other Findings:  None. IMPRESSION: No evidence of bilateral lower extremity deep venous thrombosis. Ester Sides, MD [facility] Radiology Electronically Signed   By: Ester Sides M.D.   On: 03/21/2024 16:07   MR ABDOMEN MRCP W WO CONTRAST Result Date: 03/20/2024 CLINICAL DATA:  RUQ abdominal pain, biliary disease suspected, no prior imaging RUQ pain, elevated LFTs, CBD dilation, no obvious stone one CT. EXAM: MRI ABDOMEN WITHOUT AND WITH CONTRAST (INCLUDING MRCP) TECHNIQUE: Multiplanar multisequence MR imaging of the abdomen was performed both before and after the administration of intravenous contrast. Heavily T2-weighted images of the biliary and pancreatic ducts were obtained, and three-dimensional MRCP images were rendered by post processing. CONTRAST:  6mL GADAVIST  GADOBUTROL  1 MMOL/ML IV SOLN COMPARISON:  CT scan abdomen and pelvis from 03/20/2024. FINDINGS: Lower chest: Unremarkable MR appearance to the lung bases. No pleural effusion. No pericardial effusion. Normal heart size. Hepatobiliary: The liver is markedly enlarged measuring up to 21.7 cm in length. Noncirrhotic configuration. No suspicious liver lesion. No intrahepatic bile duct dilation. The extrahepatic  bile duct measures up to 7-7.5 mm in the proximal portion, 9.5-10 mm in the midportion and gradually tapers in the distal portion measuring up to 4 mm just before the ampulla of Vater. No focal stricture, obstructing mass or choledocholithiasis seen. Gallbladder is surgically absent. Pancreas: No mass, inflammatory changes or other parenchymal abnormality identified. No main pancreatic duct dilation. Spleen:  Within normal limits in size and appearance. No focal mass. Adrenals/Urinary Tract: Unremarkable adrenal glands. No hydroureteronephrosis. There is a simple cortical cyst arising from the left kidney lower pole, measuring up to 9 x 11 mm. No suspicious renal mass. Stomach/Bowel: There is a small sliding hiatal hernia. Postsurgical changes of prior gastric bypass noted. Visualized portions within the abdomen are unremarkable. No disproportionate dilation of bowel loops. Vascular/Lymphatic: No pathologically enlarged lymph nodes identified. No abdominal aortic aneurysm demonstrated. No ascites. Other:  There is a tiny fat containing umbilical hernia. Musculoskeletal: No suspicious bone lesions identified. IMPRESSION: 1. There is mild dilation of the extrahepatic bile duct measuring up to 9.5-10 mm in the midportion, which gradually tapers in the distal portion. No focal stricture, obstructing mass or choledocholithiasis seen. Findings are nonspecific and may be related to post cholecystectomy  status. 2. Marked hepatomegaly. No suspicious liver lesion. 3. Multiple other nonacute observations (such as marked hepatomegaly, surgically absent gallbladder, simple cyst arising from the left kidney lower pole, small sliding hiatal hernia, prior gastric bypass, etc.), as described above. Electronically Signed   By: Ree Molt M.D.   On: 03/20/2024 12:42   MR 3D Recon At Scanner Result Date: 03/20/2024 CLINICAL DATA:  RUQ abdominal pain, biliary disease suspected, no prior imaging RUQ pain, elevated LFTs, CBD  dilation, no obvious stone one CT. EXAM: MRI ABDOMEN WITHOUT AND WITH CONTRAST (INCLUDING MRCP) TECHNIQUE: Multiplanar multisequence MR imaging of the abdomen was performed both before and after the administration of intravenous contrast. Heavily T2-weighted images of the biliary and pancreatic ducts were obtained, and three-dimensional MRCP images were rendered by post processing. CONTRAST:  6mL GADAVIST  GADOBUTROL  1 MMOL/ML IV SOLN COMPARISON:  CT scan abdomen and pelvis from 03/20/2024. FINDINGS: Lower chest: Unremarkable MR appearance to the lung bases. No pleural effusion. No pericardial effusion. Normal heart size. Hepatobiliary: The liver is markedly enlarged measuring up to 21.7 cm in length. Noncirrhotic configuration. No suspicious liver lesion. No intrahepatic bile duct dilation. The extrahepatic bile duct measures up to 7-7.5 mm in the proximal portion, 9.5-10 mm in the midportion and gradually tapers in the distal portion measuring up to 4 mm just before the ampulla of Vater. No focal stricture, obstructing mass or choledocholithiasis seen. Gallbladder is surgically absent. Pancreas: No mass, inflammatory changes or other parenchymal abnormality identified. No main pancreatic duct dilation. Spleen:  Within normal limits in size and appearance. No focal mass. Adrenals/Urinary Tract: Unremarkable adrenal glands. No hydroureteronephrosis. There is a simple cortical cyst arising from the left kidney lower pole, measuring up to 9 x 11 mm. No suspicious renal mass. Stomach/Bowel: There is a small sliding hiatal hernia. Postsurgical changes of prior gastric bypass noted. Visualized portions within the abdomen are unremarkable. No disproportionate dilation of bowel loops. Vascular/Lymphatic: No pathologically enlarged lymph nodes identified. No abdominal aortic aneurysm demonstrated. No ascites. Other:  There is a tiny fat containing umbilical hernia. Musculoskeletal: No suspicious bone lesions identified.  IMPRESSION: 1. There is mild dilation of the extrahepatic bile duct measuring up to 9.5-10 mm in the midportion, which gradually tapers in the distal portion. No focal stricture, obstructing mass or choledocholithiasis seen. Findings are nonspecific and may be related to post cholecystectomy status. 2. Marked hepatomegaly. No suspicious liver lesion. 3. Multiple other nonacute observations (such as marked hepatomegaly, surgically absent gallbladder, simple cyst arising from the left kidney lower pole, small sliding hiatal hernia, prior gastric bypass, etc.), as described above. Electronically Signed   By: Ree Molt M.D.   On: 03/20/2024 12:42        Scheduled Meds:  buPROPion   150 mg Oral Daily   heparin   5,000 Units Subcutaneous Q8H   ketorolac   15 mg Intravenous Q8H   lamoTRIgine   100 mg Oral BID   lidocaine   1 patch Transdermal Q24H   pantoprazole  (PROTONIX ) IV  40 mg Intravenous Q12H   sucralfate   1 g Oral TID WC & HS   topiramate   100 mg Oral BID   Vilazodone  HCl  40 mg Oral Daily   Continuous Infusions:   LOS: 1 day       Anthony CHRISTELLA Pouch, MD [facility] Pager [phone]  If 7PM-7AM, please contact night-coverage [email or web address] 03/22/2024, 10:31 AM   "

## 2024-03-22 NOTE — Plan of Care (Signed)
  Problem: Education: Goal: Knowledge of General Education information will improve Description: Including pain rating scale, medication(s)/side effects and non-pharmacologic comfort measures Outcome: Progressing   Problem: Health Behavior/Discharge Planning: Goal: Ability to manage health-related needs will improve Outcome: Progressing   Problem: Clinical Measurements: Goal: Will remain free from infection Outcome: Progressing Goal: Diagnostic test results will improve Outcome: Progressing Goal: Respiratory complications will improve Outcome: Progressing   Problem: Activity: Goal: Risk for activity intolerance will decrease Outcome: Progressing   Problem: Nutrition: Goal: Adequate nutrition will be maintained Outcome: Progressing   Problem: Coping: Goal: Level of anxiety will decrease Outcome: Progressing   Problem: Pain Managment: Goal: General experience of comfort will improve and/or be controlled Outcome: Progressing   Problem: Safety: Goal: Ability to remain free from injury will improve Outcome: Progressing

## 2024-03-22 NOTE — Plan of Care (Signed)

## 2024-03-23 LAB — COMPREHENSIVE METABOLIC PANEL WITH GFR
ALT: 417 U/L — ABNORMAL HIGH (ref 0–44)
AST: 78 U/L — ABNORMAL HIGH (ref 15–41)
Albumin: 3.6 g/dL (ref 3.5–5.0)
Alkaline Phosphatase: 65 U/L (ref 38–126)
Anion gap: 9 (ref 5–15)
BUN: 13 mg/dL (ref 6–20)
CO2: 25 mmol/L (ref 22–32)
Calcium: 9 mg/dL (ref 8.9–10.3)
Chloride: 107 mmol/L (ref 98–111)
Creatinine, Ser: 0.61 mg/dL (ref 0.44–1.00)
GFR, Estimated: >60 mL/min
Glucose, Bld: 92 mg/dL (ref 70–99)
Potassium: 3.8 mmol/L (ref 3.5–5.1)
Sodium: 141 mmol/L (ref 135–145)
Total Bilirubin: 0.4 mg/dL (ref 0.0–1.2)
Total Protein: 6.3 g/dL — ABNORMAL LOW (ref 6.5–8.1)

## 2024-03-23 LAB — CBC
HCT: 36.9 % (ref 36.0–46.0)
Hemoglobin: 11.6 g/dL — ABNORMAL LOW (ref 12.0–15.0)
MCH: 29.1 pg (ref 26.0–34.0)
MCHC: 31.4 g/dL (ref 30.0–36.0)
MCV: 92.5 fL (ref 80.0–100.0)
Platelets: 296 10*3/uL (ref 150–400)
RBC: 3.99 MIL/uL (ref 3.87–5.11)
RDW: 12.8 % (ref 11.5–15.5)
WBC: 4.6 10*3/uL (ref 4.0–10.5)
nRBC: 0 % (ref 0.0–0.2)

## 2024-03-23 MED ORDER — OXYCODONE-ACETAMINOPHEN 5-325 MG PO TABS: 1.0000 | ORAL_TABLET | Freq: Four times a day (QID) | ORAL | Status: AC | PRN

## 2024-03-23 NOTE — Progress Notes (Signed)
 "    Rogelia Copping, MD [facility]   [address] [place], [place] [address] Phone: [phone] Fax : [phone]   Subjective: The patient is sleeping in bed and appears comfortable.  The patient's liver enzymes have dramatically decreased overnight.  I had a long discussion with the patient and her family yesterday about how quickly these liver enzymes returned back to normal.  We also discussed the follow-up for a repeat SMA in 1 to 2 months.   Objective: Vital signs in last 24 hours: Vitals:   03/22/24 1651 03/22/24 2109 03/23/24 0521 03/23/24 0731  BP: 107/61 101/73 103/72 107/64  Pulse: 73 73 69 73  Resp: 16 18 16 16   Temp: 98.5 F (36.9 C) 98.1 F (36.7 C) 98 F (36.7 C) 98.9 F (37.2 C)  TempSrc:      SpO2: 95% 96% 100% 99%  Weight:      Height:       Weight change:   Intake/Output Summary (Last 24 hours) at 03/23/2024 1036 Last data filed at 03/22/2024 2120 Gross per 24 hour  Intake 480 ml  Output --  Net 480 ml     Exam:    Lab Results: @LABTEST2 @ Micro Results: Recent Results (from the past 240 hours)  Urine Culture (for pregnant, neutropenic or urologic patients or patients with an indwelling urinary catheter)     Status: None   Collection Time: 03/21/24 12:56 PM   Specimen: Urine, Clean Catch  Result Value Ref Range Status   Specimen Description   Final    URINE, CLEAN CATCH Performed at [facility], [address], [place], [place] [address]    Special Requests   Final    NONE Performed at [facility], [address] [place], [place] [address]    Culture   Final    NO GROWTH Performed at [facility], [address] [place], [place] [address]    Report Status 03/22/2024 FINAL  Final   Studies/Results: US  LIVER DOPPLER Result Date: 03/21/2024 CLINICAL DATA:  [id number] Liver function abnormality [id number] EXAM: DUPLEX ULTRASOUND OF LIVER TECHNIQUE: Color and duplex Doppler ultrasound was performed to evaluate the hepatic  in-flow and out-flow vessels. COMPARISON:  03/20/2024 CT abdomen pelvis with contrast FINDINGS: Liver: Normal parenchymal echogenicity. Normal hepatic contour without nodularity. No focal lesion, mass or intrahepatic biliary ductal dilatation. Main Portal Vein size: 1.16 cm cm Portal Vein Velocities Main Prox:  39 cm/sec Main Mid: 34 cm/sec Main Dist:  31 cm/sec Right: 20 cm/sec Left: 25 cm/sec Hepatic Vein Velocities Right:  21 cm/sec Middle:  34 cm/sec Left:  18 cm/sec IVC: Present and patent with normal respiratory phasicity. Hepatic Artery Velocity:  82 cm/sec Splenic Vein Velocity:  18 cm/sec Spleen: 8.4 cm x 3.8 cm x 8.4 cm with a total volume of 140 cm^3 (411 cm^3 is upper limit normal) Portal Vein Occlusion/Thrombus: No Splenic Vein Occlusion/Thrombus: No Ascites: None Varices: None Patent portal, hepatic and splenic veins. Normal directional flow. No portal vein occlusion or thrombus. IMPRESSION: Normal hepatic venous Doppler. Electronically Signed   By: CHRISTELLA.  Shick M.D.   On: 03/21/2024 16:23   US  Venous Img Lower Bilateral (DVT) Result Date: 03/21/2024 CLINICAL DATA:  49 year old female with bilateral lower extremity swelling. EXAM: BILATERAL LOWER EXTREMITY VENOUS DOPPLER ULTRASOUND TECHNIQUE: Gray-scale sonography with graded compression, as well as color Doppler and duplex ultrasound were performed to evaluate the lower extremity deep venous systems from the level of the common femoral vein and  including the common femoral, femoral, profunda femoral, popliteal and calf veins including the posterior tibial, peroneal and gastrocnemius veins when visible. The superficial great saphenous vein was also interrogated. Spectral Doppler was utilized to evaluate flow at rest and with distal augmentation maneuvers in the common femoral, femoral and popliteal veins. COMPARISON:  None Available. FINDINGS: RIGHT LOWER EXTREMITY Common Femoral Vein: No evidence of thrombus. Normal compressibility, respiratory  phasicity and response to augmentation. Saphenofemoral Junction: No evidence of thrombus. Normal compressibility and flow on color Doppler imaging. Profunda Femoral Vein: No evidence of thrombus. Normal compressibility and flow on color Doppler imaging. Femoral Vein: No evidence of thrombus. Normal compressibility, respiratory phasicity and response to augmentation. Popliteal Vein: No evidence of thrombus. Normal compressibility, respiratory phasicity and response to augmentation. Calf Veins: No evidence of thrombus. Normal compressibility and flow on color Doppler imaging. Other Findings:  None. LEFT LOWER EXTREMITY Common Femoral Vein: No evidence of thrombus. Normal compressibility, respiratory phasicity and response to augmentation. Saphenofemoral Junction: No evidence of thrombus. Normal compressibility and flow on color Doppler imaging. Profunda Femoral Vein: No evidence of thrombus. Normal compressibility and flow on color Doppler imaging. Femoral Vein: No evidence of thrombus. Normal compressibility, respiratory phasicity and response to augmentation. Popliteal Vein: No evidence of thrombus. Normal compressibility, respiratory phasicity and response to augmentation. Calf Veins: No evidence of thrombus. Normal compressibility and flow on color Doppler imaging. . Other Findings:  None. IMPRESSION: No evidence of bilateral lower extremity deep venous thrombosis. Ester Sides, MD [facility] Radiology Electronically Signed   By: Ester Sides M.D.   On: 03/21/2024 16:07   Medications: I have reviewed the patient's current medications. Scheduled Meds:  buPROPion   150 mg Oral Daily   heparin   5,000 Units Subcutaneous Q8H   ketorolac   15 mg Intravenous Q8H   lamoTRIgine   100 mg Oral BID   lidocaine   1 patch Transdermal Q24H   pantoprazole  (PROTONIX ) IV  40 mg Intravenous Q12H   sucralfate   1 g Oral TID WC & HS   topiramate   100 mg Oral BID   Vilazodone   HCl  40 mg Oral Daily   Continuous Infusions: PRN Meds:.albuterol , ALPRAZolam , alum & mag hydroxide-simeth, HYDROmorphone  (DILAUDID ) injection, ondansetron  **OR** ondansetron  (ZOFRAN ) IV, mouth rinse, polyethylene glycol, prochlorperazine , SUMAtriptan , zolpidem    Assessment: Principal Problem:   Hepatitis Active Problems:   Fibromyalgia   Primary insomnia   MDD (major depressive disorder), recurrent, in partial remission   Chronic pain syndrome   History of gastric bypass   GERD (gastroesophageal reflux disease)    Plan: This patient has had a dramatic decrease in her LFTs and expect them to continue to go down.  The patient synthetic function has remained normal.  The patient should follow-up with her GI doctor in [place] for repeat labs including a smooth muscle antibody.  Nothing further to do from GI point of view at this time.  The right upper quadrant pain may persist due to the swollen liver and that should resolve with time.  I will sign off.  Please call if any further GI concerns or questions.  We would like to thank you for the opportunity to participate in the care of TESSLYN BAUMERT.    LOS: 2 days   Rogelia Copping, MD.FACG 03/23/2024, 10:36 AM Pager [phone] 7am-5pm  Check AMION for 5pm -7am coverage and on weekends  "

## 2024-03-23 NOTE — Care Management Important Message (Signed)
 Important Message  Patient Details  Name: [name] MRN: [id number] Date of Birth: 18-Oct-1975   Important Message Given:  Yes - Medicare IM     Foster Frericks 03/23/2024, 12:55 PM

## 2024-03-23 NOTE — Progress Notes (Signed)
 " PROGRESS NOTE    [name]  FMW:[id number] DOB: [birth year] DOA: 03/20/2024 PCP: Loring Tanda Mae, MD   Assessment & Plan:   Principal Problem:   Hepatitis Active Problems:   History of gastric bypass   Fibromyalgia   Primary insomnia   MDD (major depressive disorder), recurrent, in partial remission   Chronic pain syndrome   GERD (gastroesophageal reflux disease)  Assessment and Plan: Right upper quadrant pain: etiology unclear. Does not take norco or tylenol  daily as per pt. MRCP showed mild dilatation of extraheptic bile duct which gradually tapers in the distal portion. No focal stricture, obstructing mass or choledocholithiasis seen. Marked hepatomegaly and no suspicious liver seen. US  liver doppler was normal. GI following and recs apprec   Transaminitis: etiology unclear, possible viral infection. Still elevated but trending down daily. Ammonia is WNL and was requested by pt. Blood cxs were ordered b/c it was requested by pt.  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 Should have repeat anti-smooth muscle antibody checked as an outpatient in 1-2 months as per GI.   Elevated INR: >10 this morning initially. Repeat INR 1.0. Etiology unclear, possibly lab error. Resolved    Chronic pain syndrome: was taking norco at home but not daily as per pt. Pt now says the norco does not work and wants to try something else. Percocet prn ordered   GERD: continue on PPI, carafate     Depression; severity unknown. Continue on home dose of lamotrigine , bupropion , vilazodone    Anxiety: severity unknown. Xanax  prn. Pt request for xanax  script at d/c    Insomnia: continue on home dose of ambien     Migraines: continue on home dose of topiramate  & sumatriptan    HTN: continue on home dose of metoprolol    HLD: holding statin   Elevated d-dimer: CTA chest neg for PE. US  b/l LE neg for DVT   DVT prophylaxis: (SCDs Code Status: full  Family  Communication: discussed pt's care w/ pt's fam at bedside and answered their questions  Disposition Plan: likely d/c home  Level of care: Med-Surg  Status is: Inpatient Remains inpatient appropriate because: pt requested blood cxs to be done. Can d/c home tomorrow     Consultants:  GI  Procedures:   Antimicrobials:   Subjective: Pt c/o chronic pain   Objective: Vitals:   03/22/24 1651 03/22/24 2109 03/23/24 0521 03/23/24 0731  BP: 107/61 101/73 103/72 107/64  Pulse: 73 73 69 73  Resp: 16 18 16 16   Temp: 98.5 F (36.9 C) 98.1 F (36.7 C) 98 F (36.7 C) 98.9 F (37.2 C)  TempSrc:      SpO2: 95% 96% 100% 99%  Weight:      Height:        Intake/Output Summary (Last 24 hours) at 03/23/2024 1024 Last data filed at 03/22/2024 2120 Gross per 24 hour  Intake 480 ml  Output --  Net 480 ml   Filed Weights   03/20/24 0711  Weight: 67.1 kg    Examination:  General exam: Appears anxious  Respiratory system: clear breath sounds b/l  Cardiovascular system: S1 & S2+. No rubs or clicks  Gastrointestinal system: abd is soft, NT, ND & hypoactive bowel sounds  Central nervous system: alert & oriented. Moves all extremities  Psychiatry: Judgement and insight appears at baseline. Anxious mood and affect      Data Reviewed: I have personally reviewed following labs and imaging studies  CBC: Recent Labs  Lab  03/20/24 9287 03/20/24 1357 03/21/24 0541 03/22/24 0614 03/23/24 0549  WBC 4.9 2.1* 3.3* 3.7* 4.6  HGB 12.8 12.4 11.9* 11.8* 11.6*  HCT 40.8 38.1 38.4 37.7 36.9  MCV 91.7 89.4 93.4 92.2 92.5  PLT 286 270 289 285 296   Basic Metabolic Panel: Recent Labs  Lab 03/20/24 0712 03/20/24 0922 03/21/24 0541 03/21/24 1027 03/22/24 0614 03/23/24 0843  NA 139  --  138 141 142 141  K 3.6  --  4.0 4.4 3.9 3.8  CL 105  --  104 105 108 107  CO2 24  --  24 27 26 25   GLUCOSE 120*  --  101* 96 85 92  BUN 11  --  12 10 12 13   CREATININE 0.66 0.64 0.64 0.69 0.60 0.61   CALCIUM  8.5*  --  8.5* 9.0 8.7* 9.0   GFR: Estimated Creatinine Clearance: 85.4 mL/min (by C-G formula based on SCr of 0.61 mg/dL). Liver Function Tests: Recent Labs  Lab 03/20/24 0712 03/21/24 0541 03/21/24 1027 03/22/24 0614 03/23/24 0843  AST 1,316* 784* 648* 219* 78*  ALT 622* 1,072* 1,042* 619* 417*  ALKPHOS 68 80 86 70 65  BILITOT 0.3 0.4 0.4 0.3 0.4  PROT 6.6 6.2* 6.7 6.0* 6.3*  ALBUMIN  3.8 3.5 3.8 3.4* 3.6   Recent Labs  Lab 03/20/24 0712  LIPASE 36   Recent Labs  Lab 03/22/24 1156  AMMONIA 22   Coagulation Profile: Recent Labs  Lab 03/21/24 0541 03/21/24 1027 03/22/24 0614  INR >10.0* 1.0 0.9   Cardiac Enzymes: No results for input(s): CKTOTAL, CKMB, CKMBINDEX, TROPONINI in the last 168 hours. BNP (last 3 results) No results for input(s): PROBNP in the last 8760 hours. HbA1C: No results for input(s): HGBA1C in the last 72 hours. CBG: No results for input(s): GLUCAP in the last 168 hours. Lipid Profile: No results for input(s): CHOL, HDL, LDLCALC, TRIG, CHOLHDL, LDLDIRECT in the last 72 hours. Thyroid  Function Tests: No results for input(s): TSH, T4TOTAL, FREET4, T3FREE, THYROIDAB in the last 72 hours. Anemia Panel: No results for input(s): VITAMINB12, FOLATE, FERRITIN, TIBC, IRON , RETICCTPCT in the last 72 hours. Sepsis Labs: No results for input(s): PROCALCITON, LATICACIDVEN in the last 168 hours.  Recent Results (from the past 240 hours)  Urine Culture (for pregnant, neutropenic or urologic patients or patients with an indwelling urinary catheter)     Status: None   Collection Time: 03/21/24 12:56 PM   Specimen: Urine, Clean Catch  Result Value Ref Range Status   Specimen Description   Final    URINE, CLEAN CATCH Performed at [facility], [address] [place], [place] [address]    Special Requests   Final    NONE Performed at [facility], [address]  [place], [place] [address]    Culture   Final    NO GROWTH Performed at [facility], [address] [place], [place] [address]    Report Status 03/22/2024 FINAL  Final         Radiology Studies: US  LIVER DOPPLER Result Date: 03/21/2024 CLINICAL DATA:  [id number] Liver function abnormality [id number] EXAM: DUPLEX ULTRASOUND OF LIVER TECHNIQUE: Color and duplex Doppler ultrasound was performed to evaluate the hepatic in-flow and out-flow vessels. COMPARISON:  03/20/2024 CT abdomen pelvis with contrast FINDINGS: Liver: Normal parenchymal echogenicity. Normal hepatic contour without nodularity. No focal lesion, mass or intrahepatic biliary ductal dilatation. Main Portal Vein size: 1.16 cm cm Portal Vein Velocities Main Prox:  39 cm/sec Main  Mid: 34 cm/sec Main Dist:  31 cm/sec Right: 20 cm/sec Left: 25 cm/sec Hepatic Vein Velocities Right:  21 cm/sec Middle:  34 cm/sec Left:  18 cm/sec IVC: Present and patent with normal respiratory phasicity. Hepatic Artery Velocity:  82 cm/sec Splenic Vein Velocity:  18 cm/sec Spleen: 8.4 cm x 3.8 cm x 8.4 cm with a total volume of 140 cm^3 (411 cm^3 is upper limit normal) Portal Vein Occlusion/Thrombus: No Splenic Vein Occlusion/Thrombus: No Ascites: None Varices: None Patent portal, hepatic and splenic veins. Normal directional flow. No portal vein occlusion or thrombus. IMPRESSION: Normal hepatic venous Doppler. Electronically Signed   By: CHRISTELLA.  Shick M.D.   On: 03/21/2024 16:23   US  Venous Img Lower Bilateral (DVT) Result Date: 03/21/2024 CLINICAL DATA:  49 year old female with bilateral lower extremity swelling. EXAM: BILATERAL LOWER EXTREMITY VENOUS DOPPLER ULTRASOUND TECHNIQUE: Gray-scale sonography with graded compression, as well as color Doppler and duplex ultrasound were performed to evaluate the lower extremity deep venous systems from the level of the common femoral vein and including the common femoral, femoral, profunda femoral, popliteal and calf veins  including the posterior tibial, peroneal and gastrocnemius veins when visible. The superficial great saphenous vein was also interrogated. Spectral Doppler was utilized to evaluate flow at rest and with distal augmentation maneuvers in the common femoral, femoral and popliteal veins. COMPARISON:  None Available. FINDINGS: RIGHT LOWER EXTREMITY Common Femoral Vein: No evidence of thrombus. Normal compressibility, respiratory phasicity and response to augmentation. Saphenofemoral Junction: No evidence of thrombus. Normal compressibility and flow on color Doppler imaging. Profunda Femoral Vein: No evidence of thrombus. Normal compressibility and flow on color Doppler imaging. Femoral Vein: No evidence of thrombus. Normal compressibility, respiratory phasicity and response to augmentation. Popliteal Vein: No evidence of thrombus. Normal compressibility, respiratory phasicity and response to augmentation. Calf Veins: No evidence of thrombus. Normal compressibility and flow on color Doppler imaging. Other Findings:  None. LEFT LOWER EXTREMITY Common Femoral Vein: No evidence of thrombus. Normal compressibility, respiratory phasicity and response to augmentation. Saphenofemoral Junction: No evidence of thrombus. Normal compressibility and flow on color Doppler imaging. Profunda Femoral Vein: No evidence of thrombus. Normal compressibility and flow on color Doppler imaging. Femoral Vein: No evidence of thrombus. Normal compressibility, respiratory phasicity and response to augmentation. Popliteal Vein: No evidence of thrombus. Normal compressibility, respiratory phasicity and response to augmentation. Calf Veins: No evidence of thrombus. Normal compressibility and flow on color Doppler imaging. . Other Findings:  None. IMPRESSION: No evidence of bilateral lower extremity deep venous thrombosis. Ester Sides, MD [facility] Electronically Signed   By: Ester Sides  M.D.   On: 03/21/2024 16:07        Scheduled Meds:  buPROPion   150 mg Oral Daily   heparin   5,000 Units Subcutaneous Q8H   ketorolac   15 mg Intravenous Q8H   lamoTRIgine   100 mg Oral BID   lidocaine   1 patch Transdermal Q24H   pantoprazole  (PROTONIX ) IV  40 mg Intravenous Q12H   sucralfate   1 g Oral TID WC & HS   topiramate   100 mg Oral BID   Vilazodone  HCl  40 mg Oral Daily   Continuous Infusions:   LOS: 2 days       Anthony CHRISTELLA Pouch, MD [facility] Pager [phone]  If 7PM-7AM, please contact night-coverage [email or web address] 03/23/2024, 10:24 AM   "

## 2024-04-26 ENCOUNTER — Ambulatory Visit: Admitting: Endocrinology

## 2024-05-15 ENCOUNTER — Ambulatory Visit: Admitting: Psychiatry
